# Patient Record
Sex: Female | Born: 1950
Health system: Southern US, Community
[De-identification: ages and names within clinical notes are randomized; demographics above are authoritative.]

## PROBLEM LIST (undated history)

## (undated) DIAGNOSIS — M1990 Unspecified osteoarthritis, unspecified site: Secondary | ICD-10-CM

## (undated) DIAGNOSIS — E785 Hyperlipidemia, unspecified: Secondary | ICD-10-CM

## (undated) DIAGNOSIS — IMO0001 Reserved for inherently not codable concepts without codable children: Secondary | ICD-10-CM

## (undated) DIAGNOSIS — M7989 Other specified soft tissue disorders: Secondary | ICD-10-CM

## (undated) DIAGNOSIS — D649 Anemia, unspecified: Secondary | ICD-10-CM

## (undated) DIAGNOSIS — I82409 Acute embolism and thrombosis of unspecified deep veins of unspecified lower extremity: Secondary | ICD-10-CM

## (undated) DIAGNOSIS — Z5189 Encounter for other specified aftercare: Secondary | ICD-10-CM

## (undated) DIAGNOSIS — I10 Essential (primary) hypertension: Secondary | ICD-10-CM

## (undated) DIAGNOSIS — C801 Malignant (primary) neoplasm, unspecified: Secondary | ICD-10-CM

## (undated) HISTORY — DX: Hyperlipidemia, unspecified: E78.5

## (undated) HISTORY — PX: HIP ARTHROPLASTY: SHX981

## (undated) HISTORY — DX: Other specified soft tissue disorders: M79.89

## (undated) HISTORY — DX: Malignant (primary) neoplasm, unspecified: C80.1

## (undated) HISTORY — PX: TUBAL LIGATION: SHX77

## (undated) HISTORY — DX: Acute embolism and thrombosis of unspecified deep veins of unspecified lower extremity: I82.409

## (undated) HISTORY — DX: Encounter for other specified aftercare: Z51.89

## (undated) HISTORY — DX: Anemia, unspecified: D64.9

## (undated) HISTORY — PX: CHOLECYSTECTOMY: SHX55

## (undated) HISTORY — DX: Reserved for inherently not codable concepts without codable children: IMO0001

## (undated) HISTORY — DX: Essential (primary) hypertension: I10

## (undated) HISTORY — DX: Unspecified osteoarthritis, unspecified site: M19.90

---

## 1996-10-13 HISTORY — PX: BREAST SURGERY: SHX581

## 1998-01-11 ENCOUNTER — Encounter: Admission: RE | Admit: 1998-01-11 | Discharge: 1998-04-11 | Payer: Self-pay | Admitting: General Surgery

## 1998-03-15 ENCOUNTER — Ambulatory Visit (HOSPITAL_BASED_OUTPATIENT_CLINIC_OR_DEPARTMENT_OTHER): Admission: RE | Admit: 1998-03-15 | Discharge: 1998-03-15 | Payer: Self-pay | Admitting: General Surgery

## 1998-04-13 ENCOUNTER — Ambulatory Visit (HOSPITAL_COMMUNITY): Admission: RE | Admit: 1998-04-13 | Discharge: 1998-04-13 | Payer: Self-pay | Admitting: Hematology & Oncology

## 1998-06-05 ENCOUNTER — Other Ambulatory Visit: Admission: RE | Admit: 1998-06-05 | Discharge: 1998-06-05 | Payer: Self-pay | Admitting: *Deleted

## 1998-06-22 ENCOUNTER — Ambulatory Visit (HOSPITAL_COMMUNITY): Admission: RE | Admit: 1998-06-22 | Discharge: 1998-06-22 | Payer: Self-pay | Admitting: Obstetrics

## 1998-06-26 ENCOUNTER — Ambulatory Visit (HOSPITAL_BASED_OUTPATIENT_CLINIC_OR_DEPARTMENT_OTHER): Admission: RE | Admit: 1998-06-26 | Discharge: 1998-06-26 | Payer: Self-pay | Admitting: General Surgery

## 1999-01-11 ENCOUNTER — Encounter: Payer: Self-pay | Admitting: General Surgery

## 1999-01-11 ENCOUNTER — Ambulatory Visit (HOSPITAL_COMMUNITY): Admission: RE | Admit: 1999-01-11 | Discharge: 1999-01-11 | Payer: Self-pay | Admitting: General Surgery

## 1999-06-10 ENCOUNTER — Other Ambulatory Visit: Admission: RE | Admit: 1999-06-10 | Discharge: 1999-06-10 | Payer: Self-pay | Admitting: *Deleted

## 1999-08-09 ENCOUNTER — Other Ambulatory Visit: Admission: RE | Admit: 1999-08-09 | Discharge: 1999-08-09 | Payer: Self-pay | Admitting: *Deleted

## 1999-08-09 ENCOUNTER — Encounter (INDEPENDENT_AMBULATORY_CARE_PROVIDER_SITE_OTHER): Payer: Self-pay | Admitting: Specialist

## 2000-01-14 ENCOUNTER — Encounter: Payer: Self-pay | Admitting: General Surgery

## 2000-01-14 ENCOUNTER — Ambulatory Visit (HOSPITAL_COMMUNITY): Admission: RE | Admit: 2000-01-14 | Discharge: 2000-01-14 | Payer: Self-pay | Admitting: General Surgery

## 2000-02-14 ENCOUNTER — Encounter: Payer: Self-pay | Admitting: General Surgery

## 2000-02-14 ENCOUNTER — Encounter: Admission: RE | Admit: 2000-02-14 | Discharge: 2000-02-14 | Payer: Self-pay | Admitting: General Surgery

## 2000-02-17 ENCOUNTER — Encounter (INDEPENDENT_AMBULATORY_CARE_PROVIDER_SITE_OTHER): Payer: Self-pay | Admitting: *Deleted

## 2000-02-17 ENCOUNTER — Ambulatory Visit (HOSPITAL_BASED_OUTPATIENT_CLINIC_OR_DEPARTMENT_OTHER): Admission: RE | Admit: 2000-02-17 | Discharge: 2000-02-17 | Payer: Self-pay | Admitting: General Surgery

## 2000-06-17 ENCOUNTER — Other Ambulatory Visit: Admission: RE | Admit: 2000-06-17 | Discharge: 2000-06-17 | Payer: Self-pay | Admitting: *Deleted

## 2001-01-15 ENCOUNTER — Encounter: Payer: Self-pay | Admitting: General Surgery

## 2001-01-15 ENCOUNTER — Ambulatory Visit (HOSPITAL_COMMUNITY): Admission: RE | Admit: 2001-01-15 | Discharge: 2001-01-15 | Payer: Self-pay | Admitting: General Surgery

## 2001-05-10 ENCOUNTER — Ambulatory Visit (HOSPITAL_BASED_OUTPATIENT_CLINIC_OR_DEPARTMENT_OTHER): Admission: RE | Admit: 2001-05-10 | Discharge: 2001-05-10 | Payer: Self-pay | Admitting: General Surgery

## 2001-05-10 ENCOUNTER — Encounter (INDEPENDENT_AMBULATORY_CARE_PROVIDER_SITE_OTHER): Payer: Self-pay | Admitting: *Deleted

## 2001-06-01 ENCOUNTER — Ambulatory Visit (HOSPITAL_COMMUNITY): Admission: RE | Admit: 2001-06-01 | Discharge: 2001-06-01 | Payer: Self-pay | Admitting: Hematology & Oncology

## 2001-06-01 ENCOUNTER — Encounter: Payer: Self-pay | Admitting: Hematology & Oncology

## 2001-06-02 ENCOUNTER — Ambulatory Visit: Admission: RE | Admit: 2001-06-02 | Discharge: 2001-08-31 | Payer: Self-pay | Admitting: Radiation Oncology

## 2001-07-05 ENCOUNTER — Other Ambulatory Visit: Admission: RE | Admit: 2001-07-05 | Discharge: 2001-07-05 | Payer: Self-pay | Admitting: *Deleted

## 2001-08-23 ENCOUNTER — Ambulatory Visit (HOSPITAL_COMMUNITY): Admission: RE | Admit: 2001-08-23 | Discharge: 2001-08-23 | Payer: Self-pay | Admitting: Hematology & Oncology

## 2001-08-23 ENCOUNTER — Encounter: Payer: Self-pay | Admitting: Hematology & Oncology

## 2002-01-04 ENCOUNTER — Encounter: Payer: Self-pay | Admitting: Hematology & Oncology

## 2002-01-04 ENCOUNTER — Ambulatory Visit (HOSPITAL_COMMUNITY): Admission: RE | Admit: 2002-01-04 | Discharge: 2002-01-04 | Payer: Self-pay | Admitting: Hematology & Oncology

## 2002-01-18 ENCOUNTER — Encounter: Payer: Self-pay | Admitting: General Surgery

## 2002-01-18 ENCOUNTER — Ambulatory Visit (HOSPITAL_COMMUNITY): Admission: RE | Admit: 2002-01-18 | Discharge: 2002-01-18 | Payer: Self-pay | Admitting: General Surgery

## 2002-07-05 ENCOUNTER — Ambulatory Visit (HOSPITAL_COMMUNITY): Admission: RE | Admit: 2002-07-05 | Discharge: 2002-07-05 | Payer: Self-pay | Admitting: Hematology & Oncology

## 2002-07-05 ENCOUNTER — Encounter: Payer: Self-pay | Admitting: Hematology & Oncology

## 2002-07-11 ENCOUNTER — Other Ambulatory Visit: Admission: RE | Admit: 2002-07-11 | Discharge: 2002-07-11 | Payer: Self-pay | Admitting: *Deleted

## 2002-10-04 ENCOUNTER — Ambulatory Visit (HOSPITAL_COMMUNITY): Admission: RE | Admit: 2002-10-04 | Discharge: 2002-10-04 | Payer: Self-pay | Admitting: Hematology & Oncology

## 2002-10-04 ENCOUNTER — Encounter: Payer: Self-pay | Admitting: Hematology & Oncology

## 2002-11-17 ENCOUNTER — Encounter: Payer: Self-pay | Admitting: Hematology & Oncology

## 2002-11-17 ENCOUNTER — Ambulatory Visit (HOSPITAL_COMMUNITY): Admission: RE | Admit: 2002-11-17 | Discharge: 2002-11-17 | Payer: Self-pay | Admitting: Hematology & Oncology

## 2003-01-31 ENCOUNTER — Ambulatory Visit (HOSPITAL_COMMUNITY): Admission: RE | Admit: 2003-01-31 | Discharge: 2003-01-31 | Payer: Self-pay | Admitting: *Deleted

## 2003-01-31 ENCOUNTER — Encounter: Payer: Self-pay | Admitting: *Deleted

## 2003-02-14 ENCOUNTER — Ambulatory Visit (HOSPITAL_COMMUNITY): Admission: RE | Admit: 2003-02-14 | Discharge: 2003-02-14 | Payer: Self-pay | Admitting: Hematology & Oncology

## 2003-02-14 ENCOUNTER — Encounter: Payer: Self-pay | Admitting: Hematology & Oncology

## 2003-07-19 ENCOUNTER — Other Ambulatory Visit: Admission: RE | Admit: 2003-07-19 | Discharge: 2003-07-19 | Payer: Self-pay | Admitting: *Deleted

## 2003-09-18 ENCOUNTER — Ambulatory Visit (HOSPITAL_COMMUNITY): Admission: RE | Admit: 2003-09-18 | Discharge: 2003-09-18 | Payer: Self-pay | Admitting: Hematology & Oncology

## 2004-02-06 ENCOUNTER — Ambulatory Visit (HOSPITAL_COMMUNITY): Admission: RE | Admit: 2004-02-06 | Discharge: 2004-02-06 | Payer: Self-pay | Admitting: *Deleted

## 2004-07-16 ENCOUNTER — Ambulatory Visit (HOSPITAL_COMMUNITY): Admission: RE | Admit: 2004-07-16 | Discharge: 2004-07-16 | Payer: Self-pay | Admitting: General Surgery

## 2004-08-05 ENCOUNTER — Other Ambulatory Visit: Admission: RE | Admit: 2004-08-05 | Discharge: 2004-08-05 | Payer: Self-pay | Admitting: *Deleted

## 2004-09-17 ENCOUNTER — Ambulatory Visit: Payer: Self-pay | Admitting: Hematology & Oncology

## 2005-02-17 ENCOUNTER — Ambulatory Visit (HOSPITAL_COMMUNITY): Admission: RE | Admit: 2005-02-17 | Discharge: 2005-02-17 | Payer: Self-pay | Admitting: Hematology & Oncology

## 2005-03-21 ENCOUNTER — Ambulatory Visit: Payer: Self-pay | Admitting: Hematology & Oncology

## 2005-08-20 ENCOUNTER — Other Ambulatory Visit: Admission: RE | Admit: 2005-08-20 | Discharge: 2005-08-20 | Payer: Self-pay | Admitting: *Deleted

## 2005-09-19 ENCOUNTER — Ambulatory Visit: Payer: Self-pay | Admitting: Hematology & Oncology

## 2006-02-11 ENCOUNTER — Encounter: Payer: Self-pay | Admitting: General Surgery

## 2006-02-18 ENCOUNTER — Ambulatory Visit (HOSPITAL_COMMUNITY): Admission: RE | Admit: 2006-02-18 | Discharge: 2006-02-18 | Payer: Self-pay | Admitting: Hematology & Oncology

## 2006-03-23 ENCOUNTER — Ambulatory Visit: Payer: Self-pay | Admitting: Hematology & Oncology

## 2006-03-25 LAB — CBC WITH DIFFERENTIAL/PLATELET
BASO%: 0.3 % (ref 0.0–2.0)
HCT: 32.5 % — ABNORMAL LOW (ref 34.8–46.6)
LYMPH%: 29 % (ref 14.0–48.0)
MCH: 28.6 pg (ref 26.0–34.0)
MCHC: 33.7 g/dL (ref 32.0–36.0)
MCV: 85 fL (ref 81.0–101.0)
MONO#: 0.4 10*3/uL (ref 0.1–0.9)
MONO%: 6.5 % (ref 0.0–13.0)
NEUT%: 62.7 % (ref 39.6–76.8)
Platelets: 266 10*3/uL (ref 145–400)
RBC: 3.83 10*6/uL (ref 3.70–5.32)
WBC: 6.3 10*3/uL (ref 3.9–10.0)

## 2006-03-25 LAB — COMPREHENSIVE METABOLIC PANEL
ALT: 12 U/L (ref 0–40)
Alkaline Phosphatase: 72 U/L (ref 39–117)
CO2: 27 mEq/L (ref 19–32)
Creatinine, Ser: 0.69 mg/dL (ref 0.40–1.20)
Total Bilirubin: 0.4 mg/dL (ref 0.3–1.2)

## 2006-03-25 LAB — CANCER ANTIGEN 27.29: CA 27.29: 17 U/mL (ref 0–39)

## 2006-08-26 ENCOUNTER — Other Ambulatory Visit: Admission: RE | Admit: 2006-08-26 | Discharge: 2006-08-26 | Payer: Self-pay | Admitting: *Deleted

## 2006-09-21 ENCOUNTER — Ambulatory Visit: Payer: Self-pay | Admitting: Hematology & Oncology

## 2006-09-23 LAB — CBC WITH DIFFERENTIAL/PLATELET
Eosinophils Absolute: 0.2 10*3/uL (ref 0.0–0.5)
HCT: 32 % — ABNORMAL LOW (ref 34.8–46.6)
LYMPH%: 31.8 % (ref 14.0–48.0)
MONO#: 0.4 10*3/uL (ref 0.1–0.9)
NEUT#: 3.5 10*3/uL (ref 1.5–6.5)
NEUT%: 58 % (ref 39.6–76.8)
Platelets: 272 10*3/uL (ref 145–400)
RBC: 3.76 10*6/uL (ref 3.70–5.32)
WBC: 6 10*3/uL (ref 3.9–10.0)
lymph#: 1.9 10*3/uL (ref 0.9–3.3)

## 2006-09-23 LAB — COMPREHENSIVE METABOLIC PANEL
CO2: 25 mEq/L (ref 19–32)
Calcium: 8.6 mg/dL (ref 8.4–10.5)
Chloride: 104 mEq/L (ref 96–112)
Glucose, Bld: 103 mg/dL — ABNORMAL HIGH (ref 70–99)
Sodium: 144 mEq/L (ref 135–145)
Total Bilirubin: 0.3 mg/dL (ref 0.3–1.2)
Total Protein: 6.5 g/dL (ref 6.0–8.3)

## 2006-10-09 ENCOUNTER — Ambulatory Visit (HOSPITAL_COMMUNITY): Admission: RE | Admit: 2006-10-09 | Discharge: 2006-10-09 | Payer: Self-pay | Admitting: *Deleted

## 2007-02-23 ENCOUNTER — Ambulatory Visit (HOSPITAL_COMMUNITY): Admission: RE | Admit: 2007-02-23 | Discharge: 2007-02-23 | Payer: Self-pay | Admitting: Hematology & Oncology

## 2007-09-14 ENCOUNTER — Other Ambulatory Visit: Admission: RE | Admit: 2007-09-14 | Discharge: 2007-09-14 | Payer: Self-pay | Admitting: *Deleted

## 2007-09-20 ENCOUNTER — Ambulatory Visit: Payer: Self-pay | Admitting: Hematology & Oncology

## 2007-09-22 LAB — COMPREHENSIVE METABOLIC PANEL
ALT: 13 U/L (ref 0–35)
Albumin: 3.9 g/dL (ref 3.5–5.2)
CO2: 26 mEq/L (ref 19–32)
Glucose, Bld: 110 mg/dL — ABNORMAL HIGH (ref 70–99)
Potassium: 3.7 mEq/L (ref 3.5–5.3)
Sodium: 143 mEq/L (ref 135–145)
Total Protein: 7 g/dL (ref 6.0–8.3)

## 2007-09-22 LAB — CBC WITH DIFFERENTIAL/PLATELET
Basophils Absolute: 0 10*3/uL (ref 0.0–0.1)
HCT: 32.1 % — ABNORMAL LOW (ref 34.8–46.6)
HGB: 10.9 g/dL — ABNORMAL LOW (ref 11.6–15.9)
MCH: 28.5 pg (ref 26.0–34.0)
MONO#: 0.4 10*3/uL (ref 0.1–0.9)
NEUT%: 56 % (ref 39.6–76.8)
Platelets: 285 10*3/uL (ref 145–400)
WBC: 6.4 10*3/uL (ref 3.9–10.0)
lymph#: 2.2 10*3/uL (ref 0.9–3.3)

## 2008-02-25 ENCOUNTER — Ambulatory Visit (HOSPITAL_COMMUNITY): Admission: RE | Admit: 2008-02-25 | Discharge: 2008-02-25 | Payer: Self-pay | Admitting: *Deleted

## 2008-09-22 ENCOUNTER — Ambulatory Visit: Payer: Self-pay | Admitting: Hematology & Oncology

## 2008-09-25 LAB — CBC WITH DIFFERENTIAL (CANCER CENTER ONLY)
BASO#: 0 10*3/uL (ref 0.0–0.2)
BASO%: 0.4 % (ref 0.0–2.0)
Eosinophils Absolute: 0.1 10*3/uL (ref 0.0–0.5)
HCT: 33.6 % — ABNORMAL LOW (ref 34.8–46.6)
HGB: 11.2 g/dL — ABNORMAL LOW (ref 11.6–15.9)
LYMPH#: 1.9 10*3/uL (ref 0.9–3.3)
LYMPH%: 28.7 % (ref 14.0–48.0)
MCV: 84 fL (ref 81–101)
MONO#: 0.3 10*3/uL (ref 0.1–0.9)
NEUT%: 64 % (ref 39.6–80.0)
RBC: 3.99 10*6/uL (ref 3.70–5.32)
WBC: 6.5 10*3/uL (ref 3.9–10.0)

## 2008-09-25 LAB — COMPREHENSIVE METABOLIC PANEL
CO2: 25 mEq/L (ref 19–32)
Creatinine, Ser: 0.63 mg/dL (ref 0.40–1.20)
Glucose, Bld: 102 mg/dL — ABNORMAL HIGH (ref 70–99)
Total Bilirubin: 0.3 mg/dL (ref 0.3–1.2)

## 2008-10-17 ENCOUNTER — Other Ambulatory Visit: Admission: RE | Admit: 2008-10-17 | Discharge: 2008-10-17 | Payer: Self-pay | Admitting: Gynecology

## 2009-02-16 ENCOUNTER — Ambulatory Visit: Payer: Self-pay | Admitting: Hematology & Oncology

## 2009-02-19 LAB — CBC WITH DIFFERENTIAL (CANCER CENTER ONLY)
BASO%: 0.3 % (ref 0.0–2.0)
Eosinophils Absolute: 0.1 10*3/uL (ref 0.0–0.5)
LYMPH#: 1.8 10*3/uL (ref 0.9–3.3)
LYMPH%: 38.8 % (ref 14.0–48.0)
MCV: 84 fL (ref 81–101)
MONO#: 0.3 10*3/uL (ref 0.1–0.9)
NEUT#: 2.4 10*3/uL (ref 1.5–6.5)
Platelets: 185 10*3/uL (ref 145–400)
RBC: 3.89 10*6/uL (ref 3.70–5.32)
RDW: 13.3 % (ref 10.5–14.6)
WBC: 4.7 10*3/uL (ref 3.9–10.0)

## 2009-02-19 LAB — COMPREHENSIVE METABOLIC PANEL
ALT: 11 U/L (ref 0–35)
AST: 16 U/L (ref 0–37)
Alkaline Phosphatase: 69 U/L (ref 39–117)
CO2: 25 mEq/L (ref 19–32)
Sodium: 142 mEq/L (ref 135–145)
Total Bilirubin: 0.4 mg/dL (ref 0.3–1.2)
Total Protein: 6.7 g/dL (ref 6.0–8.3)

## 2009-02-26 ENCOUNTER — Ambulatory Visit (HOSPITAL_COMMUNITY): Admission: RE | Admit: 2009-02-26 | Discharge: 2009-02-26 | Payer: Self-pay | Admitting: General Surgery

## 2009-08-17 ENCOUNTER — Ambulatory Visit: Payer: Self-pay | Admitting: Hematology & Oncology

## 2009-08-20 ENCOUNTER — Ambulatory Visit (HOSPITAL_BASED_OUTPATIENT_CLINIC_OR_DEPARTMENT_OTHER): Admission: RE | Admit: 2009-08-20 | Discharge: 2009-08-20 | Payer: Self-pay | Admitting: Hematology & Oncology

## 2009-08-20 ENCOUNTER — Ambulatory Visit: Payer: Self-pay | Admitting: Diagnostic Radiology

## 2009-08-20 LAB — CBC WITH DIFFERENTIAL (CANCER CENTER ONLY)
BASO%: 0.3 % (ref 0.0–2.0)
Eosinophils Absolute: 0.1 10*3/uL (ref 0.0–0.5)
MCH: 28.6 pg (ref 26.0–34.0)
MONO#: 0.3 10*3/uL (ref 0.1–0.9)
MONO%: 5.4 % (ref 0.0–13.0)
NEUT#: 3.1 10*3/uL (ref 1.5–6.5)
Platelets: 213 10*3/uL (ref 145–400)
RBC: 3.86 10*6/uL (ref 3.70–5.32)
RDW: 13.2 % (ref 10.5–14.6)
WBC: 5.6 10*3/uL (ref 3.9–10.0)

## 2009-08-21 LAB — COMPREHENSIVE METABOLIC PANEL
AST: 11 U/L (ref 0–37)
Albumin: 4.1 g/dL (ref 3.5–5.2)
BUN: 17 mg/dL (ref 6–23)
CO2: 24 mEq/L (ref 19–32)
Calcium: 9.1 mg/dL (ref 8.4–10.5)
Chloride: 107 mEq/L (ref 96–112)
Glucose, Bld: 114 mg/dL — ABNORMAL HIGH (ref 70–99)
Potassium: 3.8 mEq/L (ref 3.5–5.3)

## 2009-08-21 LAB — VITAMIN D 25 HYDROXY (VIT D DEFICIENCY, FRACTURES): Vit D, 25-Hydroxy: 15 ng/mL — ABNORMAL LOW (ref 30–89)

## 2010-02-07 ENCOUNTER — Ambulatory Visit: Payer: Self-pay | Admitting: Hematology & Oncology

## 2010-02-11 LAB — COMPREHENSIVE METABOLIC PANEL
Alkaline Phosphatase: 65 U/L (ref 39–117)
BUN: 15 mg/dL (ref 6–23)
Creatinine, Ser: 0.64 mg/dL (ref 0.40–1.20)
Glucose, Bld: 112 mg/dL — ABNORMAL HIGH (ref 70–99)
Total Bilirubin: 0.3 mg/dL (ref 0.3–1.2)

## 2010-02-11 LAB — CBC WITH DIFFERENTIAL (CANCER CENTER ONLY)
BASO#: 0 10*3/uL (ref 0.0–0.2)
Eosinophils Absolute: 0.1 10*3/uL (ref 0.0–0.5)
HGB: 11.1 g/dL — ABNORMAL LOW (ref 11.6–15.9)
LYMPH%: 39 % (ref 14.0–48.0)
MCH: 28.9 pg (ref 26.0–34.0)
MCV: 84 fL (ref 81–101)
MONO%: 5.6 % (ref 0.0–13.0)
NEUT%: 52.3 % (ref 39.6–80.0)
Platelets: 221 10*3/uL (ref 145–400)
RBC: 3.84 10*6/uL (ref 3.70–5.32)

## 2010-03-04 ENCOUNTER — Ambulatory Visit (HOSPITAL_COMMUNITY): Admission: RE | Admit: 2010-03-04 | Discharge: 2010-03-04 | Payer: Self-pay | Admitting: General Surgery

## 2010-06-13 ENCOUNTER — Encounter: Admission: RE | Admit: 2010-06-13 | Discharge: 2010-06-13 | Payer: Self-pay | Admitting: Orthopedic Surgery

## 2010-08-09 ENCOUNTER — Ambulatory Visit: Payer: Self-pay | Admitting: Hematology & Oncology

## 2010-08-14 LAB — CBC WITH DIFFERENTIAL (CANCER CENTER ONLY)
Eosinophils Absolute: 0.1 10*3/uL (ref 0.0–0.5)
HCT: 31.2 % — ABNORMAL LOW (ref 34.8–46.6)
LYMPH#: 2.3 10*3/uL (ref 0.9–3.3)
LYMPH%: 37.5 % (ref 14.0–48.0)
MCV: 84 fL (ref 81–101)
MONO#: 0.4 10*3/uL (ref 0.1–0.9)
NEUT%: 53.4 % (ref 39.6–80.0)
RBC: 3.73 10*6/uL (ref 3.70–5.32)
WBC: 6.2 10*3/uL (ref 3.9–10.0)

## 2010-08-16 LAB — VITAMIN D 25 HYDROXY (VIT D DEFICIENCY, FRACTURES): Vit D, 25-Hydroxy: 24 ng/mL — ABNORMAL LOW (ref 30–89)

## 2010-08-16 LAB — RETICULOCYTES (CHCC)
ABS Retic: 40.3 10*3/uL (ref 19.0–186.0)
RBC.: 3.66 MIL/uL — ABNORMAL LOW (ref 3.87–5.11)

## 2010-08-16 LAB — COMPREHENSIVE METABOLIC PANEL
CO2: 27 mEq/L (ref 19–32)
Calcium: 8.9 mg/dL (ref 8.4–10.5)
Chloride: 107 mEq/L (ref 96–112)
Creatinine, Ser: 0.65 mg/dL (ref 0.40–1.20)
Glucose, Bld: 99 mg/dL (ref 70–99)
Sodium: 144 mEq/L (ref 135–145)
Total Bilirubin: 0.2 mg/dL — ABNORMAL LOW (ref 0.3–1.2)
Total Protein: 6.6 g/dL (ref 6.0–8.3)

## 2010-08-16 LAB — PROTEIN ELECTROPHORESIS, SERUM
Albumin ELP: 53.8 % — ABNORMAL LOW (ref 55.8–66.1)
Alpha-1-Globulin: 5.3 % — ABNORMAL HIGH (ref 2.9–4.9)
Alpha-2-Globulin: 14.3 % — ABNORMAL HIGH (ref 7.1–11.8)
Beta 2: 6.3 % (ref 3.2–6.5)
Beta Globulin: 5.8 % (ref 4.7–7.2)
Total Protein, Serum Electrophoresis: 6.6 g/dL (ref 6.0–8.3)

## 2010-08-16 LAB — IRON AND TIBC: TIBC: 271 ug/dL (ref 250–470)

## 2010-08-16 LAB — FERRITIN: Ferritin: 108 ng/mL (ref 10–291)

## 2010-09-17 ENCOUNTER — Ambulatory Visit (HOSPITAL_COMMUNITY)
Admission: RE | Admit: 2010-09-17 | Discharge: 2010-09-17 | Payer: Self-pay | Source: Home / Self Care | Attending: Hematology & Oncology | Admitting: Hematology & Oncology

## 2010-10-13 DIAGNOSIS — C801 Malignant (primary) neoplasm, unspecified: Secondary | ICD-10-CM | POA: Insufficient documentation

## 2010-10-13 HISTORY — DX: Malignant (primary) neoplasm, unspecified: C80.1

## 2010-10-13 HISTORY — PX: MASTECTOMY: SHX3

## 2010-10-18 ENCOUNTER — Ambulatory Visit (HOSPITAL_BASED_OUTPATIENT_CLINIC_OR_DEPARTMENT_OTHER): Payer: 59 | Admitting: Hematology & Oncology

## 2010-10-21 LAB — CBC WITH DIFFERENTIAL (CANCER CENTER ONLY)
BASO#: 0 10*3/uL (ref 0.0–0.2)
BASO%: 0.3 % (ref 0.0–2.0)
EOS%: 1.9 % (ref 0.0–7.0)
Eosinophils Absolute: 0.2 10*3/uL (ref 0.0–0.5)
HCT: 29.8 % — ABNORMAL LOW (ref 34.8–46.6)
HGB: 9.8 g/dL — ABNORMAL LOW (ref 11.6–15.9)
LYMPH#: 2 10*3/uL (ref 0.9–3.3)
LYMPH%: 23.5 % (ref 14.0–48.0)
MCH: 26.8 pg (ref 26.0–34.0)
MCHC: 33 g/dL (ref 32.0–36.0)
MCV: 81 fL (ref 81–101)
MONO#: 0.5 10*3/uL (ref 0.1–0.9)
MONO%: 5.8 % (ref 0.0–13.0)
NEUT#: 5.8 10*3/uL (ref 1.5–6.5)
NEUT%: 68.5 % (ref 39.6–80.0)
Platelets: 339 10*3/uL (ref 145–400)
RBC: 3.66 10*6/uL — ABNORMAL LOW (ref 3.70–5.32)
RDW: 12.5 % (ref 10.5–14.6)
WBC: 8.4 10*3/uL (ref 3.9–10.0)

## 2010-10-21 LAB — RETICULOCYTES (CHCC)
ABS Retic: 32.9 10*3/uL (ref 19.0–186.0)
RBC.: 3.66 MIL/uL — ABNORMAL LOW (ref 3.87–5.11)
Retic Ct Pct: 0.9 % (ref 0.4–3.1)

## 2010-10-21 LAB — CHCC SATELLITE - SMEAR

## 2010-11-02 ENCOUNTER — Encounter: Payer: Self-pay | Admitting: General Surgery

## 2010-11-02 ENCOUNTER — Encounter: Payer: Self-pay | Admitting: Hematology & Oncology

## 2010-11-14 ENCOUNTER — Encounter (HOSPITAL_BASED_OUTPATIENT_CLINIC_OR_DEPARTMENT_OTHER): Payer: 59 | Admitting: Hematology & Oncology

## 2010-11-14 DIAGNOSIS — C50919 Malignant neoplasm of unspecified site of unspecified female breast: Secondary | ICD-10-CM

## 2010-11-14 DIAGNOSIS — D649 Anemia, unspecified: Secondary | ICD-10-CM

## 2010-11-14 DIAGNOSIS — D509 Iron deficiency anemia, unspecified: Secondary | ICD-10-CM

## 2010-11-14 LAB — IRON AND TIBC
%SAT: 18 % — ABNORMAL LOW (ref 20–55)
Iron: 44 ug/dL (ref 42–145)
TIBC: 238 ug/dL — ABNORMAL LOW (ref 250–470)
UIBC: 194 ug/dL

## 2010-11-14 LAB — COMPREHENSIVE METABOLIC PANEL
ALT: 8 U/L (ref 0–35)
AST: 11 U/L (ref 0–37)
Albumin: 3.9 g/dL (ref 3.5–5.2)
Alkaline Phosphatase: 78 U/L (ref 39–117)
BUN: 14 mg/dL (ref 6–23)
CO2: 25 mEq/L (ref 19–32)
Calcium: 9 mg/dL (ref 8.4–10.5)
Chloride: 104 mEq/L (ref 96–112)
Creatinine, Ser: 0.62 mg/dL (ref 0.40–1.20)
Glucose, Bld: 85 mg/dL (ref 70–99)
Potassium: 3.7 mEq/L (ref 3.5–5.3)
Sodium: 143 mEq/L (ref 135–145)
Total Bilirubin: 0.4 mg/dL (ref 0.3–1.2)
Total Protein: 7.2 g/dL (ref 6.0–8.3)

## 2010-11-14 LAB — CHCC SATELLITE - SMEAR

## 2010-11-14 LAB — CBC WITH DIFFERENTIAL (CANCER CENTER ONLY)
BASO#: 0 10*3/uL (ref 0.0–0.2)
BASO%: 0.5 % (ref 0.0–2.0)
EOS%: 2.1 % (ref 0.0–7.0)
Eosinophils Absolute: 0.1 10*3/uL (ref 0.0–0.5)
HCT: 32.9 % — ABNORMAL LOW (ref 34.8–46.6)
HGB: 11 g/dL — ABNORMAL LOW (ref 11.6–15.9)
LYMPH#: 1.9 10*3/uL (ref 0.9–3.3)
LYMPH%: 27.4 % (ref 14.0–48.0)
MCH: 27.7 pg (ref 26.0–34.0)
MCHC: 33.4 g/dL (ref 32.0–36.0)
MCV: 83 fL (ref 81–101)
MONO#: 0.4 10*3/uL (ref 0.1–0.9)
MONO%: 6.2 % (ref 0.0–13.0)
NEUT#: 4.3 10*3/uL (ref 1.5–6.5)
NEUT%: 63.8 % (ref 39.6–80.0)
Platelets: 273 10*3/uL (ref 145–400)
RBC: 3.95 10*6/uL (ref 3.70–5.32)
RDW: 13.2 % (ref 10.5–14.6)
WBC: 6.8 10*3/uL (ref 3.9–10.0)

## 2010-11-14 LAB — RETICULOCYTES (CHCC)
ABS Retic: 35.2 10*3/uL (ref 19.0–186.0)
RBC.: 3.91 MIL/uL (ref 3.87–5.11)
Retic Ct Pct: 0.9 % (ref 0.4–3.1)

## 2010-11-14 LAB — FERRITIN: Ferritin: 288 ng/mL (ref 10–291)

## 2010-11-14 LAB — CANCER ANTIGEN 27.29: CA 27.29: 21 U/mL (ref 0–39)

## 2011-01-10 ENCOUNTER — Other Ambulatory Visit: Payer: Self-pay | Admitting: Hematology & Oncology

## 2011-01-10 ENCOUNTER — Encounter (HOSPITAL_BASED_OUTPATIENT_CLINIC_OR_DEPARTMENT_OTHER): Payer: 59 | Admitting: Hematology & Oncology

## 2011-01-10 DIAGNOSIS — D649 Anemia, unspecified: Secondary | ICD-10-CM

## 2011-01-10 DIAGNOSIS — C50919 Malignant neoplasm of unspecified site of unspecified female breast: Secondary | ICD-10-CM

## 2011-01-10 DIAGNOSIS — D509 Iron deficiency anemia, unspecified: Secondary | ICD-10-CM

## 2011-01-10 LAB — CBC WITH DIFFERENTIAL (CANCER CENTER ONLY)
BASO%: 0.3 % (ref 0.0–2.0)
EOS%: 1.5 % (ref 0.0–7.0)
Eosinophils Absolute: 0.1 10*3/uL (ref 0.0–0.5)
LYMPH%: 27 % (ref 14.0–48.0)
MCH: 28.2 pg (ref 26.0–34.0)
MCHC: 32.4 g/dL (ref 32.0–36.0)
MCV: 87 fL (ref 81–101)
MONO%: 7.8 % (ref 0.0–13.0)
NEUT#: 4.3 10*3/uL (ref 1.5–6.5)
Platelets: 228 10*3/uL (ref 145–400)
RBC: 3.87 10*6/uL (ref 3.70–5.32)
RDW: 15.1 % (ref 11.1–15.7)

## 2011-01-10 LAB — IRON AND TIBC
%SAT: 25 % (ref 20–55)
Iron: 63 ug/dL (ref 42–145)

## 2011-01-10 LAB — RETICULOCYTES (CHCC)
RBC.: 3.95 MIL/uL (ref 3.87–5.11)
Retic Ct Pct: 0.8 % (ref 0.4–3.1)

## 2011-02-03 ENCOUNTER — Other Ambulatory Visit (HOSPITAL_COMMUNITY): Payer: Self-pay | Admitting: General Surgery

## 2011-02-03 DIAGNOSIS — Z1231 Encounter for screening mammogram for malignant neoplasm of breast: Secondary | ICD-10-CM

## 2011-02-28 NOTE — Op Note (Signed)
[place]  Patient:    [name], [name]                           MRN: [id number] Proc. Date: 02/17/00 Adm. Date:  [id number] Disc. Date: [id number] Attending:  Arlis Porta                           Operative Report  PREOPERATIVE DIAGNOSIS:  Left breast mass.  POSTOPERATIVE DIAGNOSIS:  Left breast mass.  PROCEDURE:  Left breast biopsy.  SURGEON:  Adolph Pollack, M.D.  ANESTHESIA:  Local (1% lidocaine with epinephrine, plus 0.5% plain Marcaine, plus sodium bicarbonate) with MAC.  INDICATIONS:  Ms. [name] is a 60 year old female, status post right modified radical mastectomy for breast cancer.  On her follow-up visit, she had a palpable mass at the 5-6 oclock position and now presents for biopsy.  TECHNIQUE:  She was placed supine on the operating table and given intravenous sedation.  The left breast area was sterilely prepped and draped.  Local anesthetic was infiltrated directly over the mass in the left inferior breast region and an incision was made directly over the mass and the mass was excised sharply and sent to pathology fresh.  Next, the biopsy cavity was inspected and hemostasis was obtained with the cautery.  Once hemostasis was adequate, subcutaneous fat was loosely approximated with interrupted 3-0 Vicryl sutures and the skin closed with a 4-0 Monocryl subcuticular stitch. Steri-Strips and sterile dressing were applied.  She tolerated the procedure well without any apparent complications and was taken to the recovery room in satisfactory condition. DD:  03/16/00 TD:  03/18/00 Job: [id number]

## 2011-03-07 ENCOUNTER — Ambulatory Visit (HOSPITAL_COMMUNITY): Payer: 59

## 2011-03-07 ENCOUNTER — Ambulatory Visit (HOSPITAL_COMMUNITY)
Admission: RE | Admit: 2011-03-07 | Discharge: 2011-03-07 | Disposition: A | Discharge status: Home / Self Care | Payer: 59 | Source: Ambulatory Visit | Attending: General Surgery | Admitting: General Surgery

## 2011-03-07 DIAGNOSIS — Z1231 Encounter for screening mammogram for malignant neoplasm of breast: Secondary | ICD-10-CM | POA: Insufficient documentation

## 2011-03-14 ENCOUNTER — Other Ambulatory Visit: Payer: Self-pay | Admitting: Hematology & Oncology

## 2011-03-14 ENCOUNTER — Encounter (HOSPITAL_BASED_OUTPATIENT_CLINIC_OR_DEPARTMENT_OTHER): Payer: 59 | Admitting: Hematology & Oncology

## 2011-03-14 DIAGNOSIS — C50919 Malignant neoplasm of unspecified site of unspecified female breast: Secondary | ICD-10-CM

## 2011-03-14 DIAGNOSIS — D509 Iron deficiency anemia, unspecified: Secondary | ICD-10-CM

## 2011-03-14 LAB — COMPREHENSIVE METABOLIC PANEL
Alkaline Phosphatase: 74 U/L (ref 39–117)
BUN: 21 mg/dL (ref 6–23)
CO2: 27 mEq/L (ref 19–32)
Creatinine, Ser: 0.56 mg/dL (ref 0.50–1.10)
Glucose, Bld: 99 mg/dL (ref 70–99)
Sodium: 143 mEq/L (ref 135–145)
Total Bilirubin: 0.3 mg/dL (ref 0.3–1.2)
Total Protein: 7.1 g/dL (ref 6.0–8.3)

## 2011-03-14 LAB — IRON AND TIBC
%SAT: 14 % — ABNORMAL LOW (ref 20–55)
Iron: 37 ug/dL — ABNORMAL LOW (ref 42–145)
UIBC: 237 ug/dL

## 2011-03-14 LAB — FERRITIN: Ferritin: 166 ng/mL (ref 10–291)

## 2011-03-14 LAB — CBC WITH DIFFERENTIAL (CANCER CENTER ONLY)
BASO#: 0 10*3/uL (ref 0.0–0.2)
Eosinophils Absolute: 0.2 10*3/uL (ref 0.0–0.5)
HCT: 31.9 % — ABNORMAL LOW (ref 34.8–46.6)
HGB: 10.5 g/dL — ABNORMAL LOW (ref 11.6–15.9)
LYMPH#: 2.1 10*3/uL (ref 0.9–3.3)
MCH: 28.8 pg (ref 26.0–34.0)
MONO%: 7.4 % (ref 0.0–13.0)
NEUT#: 3.4 10*3/uL (ref 1.5–6.5)
RBC: 3.65 10*6/uL — ABNORMAL LOW (ref 3.70–5.32)

## 2011-03-14 LAB — RETICULOCYTES (CHCC): Retic Ct Pct: 1.2 % (ref 0.4–3.1)

## 2011-03-21 ENCOUNTER — Encounter (HOSPITAL_BASED_OUTPATIENT_CLINIC_OR_DEPARTMENT_OTHER): Payer: 59 | Admitting: Hematology & Oncology

## 2011-03-21 DIAGNOSIS — C50919 Malignant neoplasm of unspecified site of unspecified female breast: Secondary | ICD-10-CM

## 2011-03-21 DIAGNOSIS — D509 Iron deficiency anemia, unspecified: Secondary | ICD-10-CM

## 2011-03-28 ENCOUNTER — Encounter (HOSPITAL_COMMUNITY)
Admission: RE | Admit: 2011-03-28 | Discharge: 2011-03-28 | Disposition: A | Discharge status: Home / Self Care | Payer: 59 | Source: Ambulatory Visit | Attending: Orthopedic Surgery | Admitting: Orthopedic Surgery

## 2011-03-28 ENCOUNTER — Other Ambulatory Visit (HOSPITAL_COMMUNITY): Payer: Self-pay | Admitting: Orthopedic Surgery

## 2011-03-28 ENCOUNTER — Ambulatory Visit (HOSPITAL_COMMUNITY)
Admission: RE | Admit: 2011-03-28 | Discharge: 2011-03-28 | Disposition: A | Discharge status: Home / Self Care | Payer: 59 | Source: Ambulatory Visit | Attending: Orthopedic Surgery | Admitting: Orthopedic Surgery

## 2011-03-28 DIAGNOSIS — Z01818 Encounter for other preprocedural examination: Secondary | ICD-10-CM | POA: Insufficient documentation

## 2011-03-28 DIAGNOSIS — Z01812 Encounter for preprocedural laboratory examination: Secondary | ICD-10-CM | POA: Insufficient documentation

## 2011-03-28 DIAGNOSIS — Z96649 Presence of unspecified artificial hip joint: Secondary | ICD-10-CM

## 2011-03-28 LAB — COMPREHENSIVE METABOLIC PANEL
ALT: 15 U/L (ref 0–35)
AST: 14 U/L (ref 0–37)
Alkaline Phosphatase: 87 U/L (ref 39–117)
CO2: 32 mEq/L (ref 19–32)
Calcium: 9 mg/dL (ref 8.4–10.5)
GFR calc Af Amer: >60 mL/min (ref >60)
GFR calc non Af Amer: >60 mL/min (ref >60)
Glucose, Bld: 95 mg/dL (ref 70–99)
Potassium: 3.4 mEq/L — ABNORMAL LOW (ref 3.5–5.1)
Sodium: 143 mEq/L (ref 135–145)
Total Protein: 7.2 g/dL (ref 6.0–8.3)

## 2011-03-28 LAB — CBC
Hemoglobin: 11.3 g/dL — ABNORMAL LOW (ref 12.0–15.0)
MCH: 28.5 pg (ref 26.0–34.0)
MCHC: 32 g/dL (ref 30.0–36.0)
Platelets: 235 10*3/uL (ref 150–400)
RDW: 13.8 % (ref 11.5–15.5)

## 2011-03-28 LAB — DIFFERENTIAL
Basophils Absolute: 0 10*3/uL (ref 0.0–0.1)
Basophils Relative: 0 % (ref 0–1)
Eosinophils Absolute: 0.2 10*3/uL (ref 0.0–0.7)
Eosinophils Relative: 2 % (ref 0–5)
Monocytes Absolute: 0.7 10*3/uL (ref 0.1–1.0)
Monocytes Relative: 8 % (ref 3–12)
Neutro Abs: 4.2 10*3/uL (ref 1.7–7.7)

## 2011-03-28 LAB — URINALYSIS, ROUTINE W REFLEX MICROSCOPIC
Bilirubin Urine: NEGATIVE
Glucose, UA: NEGATIVE mg/dL
Protein, ur: NEGATIVE mg/dL
Urobilinogen, UA: 0.2 mg/dL (ref 0.0–1.0)

## 2011-03-28 LAB — URINE MICROSCOPIC-ADD ON

## 2011-03-28 LAB — PROTIME-INR
INR: 0.95 (ref 0.00–1.49)
Prothrombin Time: 12.9 seconds (ref 11.6–15.2)

## 2011-03-30 LAB — URINE CULTURE: Colony Count: >=100000

## 2011-04-04 ENCOUNTER — Inpatient Hospital Stay (HOSPITAL_COMMUNITY)
Admission: RE | Admit: 2011-04-04 | Discharge: 2011-04-09 | DRG: 470 | Disposition: A | Discharge status: Home Health | Payer: 59 | Source: Ambulatory Visit | Attending: Orthopedic Surgery | Admitting: Orthopedic Surgery

## 2011-04-04 ENCOUNTER — Inpatient Hospital Stay (HOSPITAL_COMMUNITY): Payer: 59

## 2011-04-04 DIAGNOSIS — K5900 Constipation, unspecified: Secondary | ICD-10-CM | POA: Diagnosis present

## 2011-04-04 DIAGNOSIS — M169 Osteoarthritis of hip, unspecified: Principal | ICD-10-CM | POA: Diagnosis present

## 2011-04-04 DIAGNOSIS — Z853 Personal history of malignant neoplasm of breast: Secondary | ICD-10-CM

## 2011-04-04 DIAGNOSIS — Z6835 Body mass index (BMI) 35.0-35.9, adult: Secondary | ICD-10-CM

## 2011-04-04 DIAGNOSIS — M1610 Unilateral primary osteoarthritis, unspecified hip: Principal | ICD-10-CM | POA: Diagnosis present

## 2011-04-04 DIAGNOSIS — E876 Hypokalemia: Secondary | ICD-10-CM | POA: Diagnosis present

## 2011-04-04 DIAGNOSIS — D62 Acute posthemorrhagic anemia: Secondary | ICD-10-CM | POA: Diagnosis present

## 2011-04-04 DIAGNOSIS — N390 Urinary tract infection, site not specified: Secondary | ICD-10-CM | POA: Diagnosis present

## 2011-04-04 HISTORY — PX: JOINT REPLACEMENT: SHX530

## 2011-04-05 LAB — POCT I-STAT 4, (NA,K, GLUC, HGB,HCT)
Glucose, Bld: 131 mg/dL — ABNORMAL HIGH (ref 70–99)
Hemoglobin: 9.2 g/dL — ABNORMAL LOW (ref 12.0–15.0)
Potassium: 3.5 mEq/L (ref 3.5–5.1)
Sodium: 144 mEq/L (ref 135–145)

## 2011-04-05 LAB — CBC
MCH: 27.4 pg (ref 26.0–34.0)
MCHC: 31.2 g/dL (ref 30.0–36.0)
MCV: 88 fL (ref 78.0–100.0)
Platelets: 110 10*3/uL — ABNORMAL LOW (ref 150–400)
RDW: 14.1 % (ref 11.5–15.5)

## 2011-04-05 LAB — BASIC METABOLIC PANEL
CO2: 29 mEq/L (ref 19–32)
Calcium: 7.8 mg/dL — ABNORMAL LOW (ref 8.4–10.5)
Creatinine, Ser: 0.53 mg/dL (ref 0.50–1.10)
GFR calc Af Amer: >60 mL/min (ref >60)
GFR calc non Af Amer: >60 mL/min (ref >60)

## 2011-04-06 LAB — CBC
HCT: 20.4 % — ABNORMAL LOW (ref 36.0–46.0)
MCHC: 32.8 g/dL (ref 30.0–36.0)
MCV: 88.3 fL (ref 78.0–100.0)
RDW: 14.5 % (ref 11.5–15.5)

## 2011-04-06 LAB — URINE CULTURE

## 2011-04-06 LAB — BASIC METABOLIC PANEL
BUN: 13 mg/dL (ref 6–23)
Creatinine, Ser: 0.55 mg/dL (ref 0.50–1.10)
GFR calc Af Amer: >60 mL/min (ref >60)
GFR calc non Af Amer: >60 mL/min (ref >60)
Potassium: 3.4 mEq/L — ABNORMAL LOW (ref 3.5–5.1)

## 2011-04-07 LAB — BASIC METABOLIC PANEL
Calcium: 6.9 mg/dL — ABNORMAL LOW (ref 8.4–10.5)
Creatinine, Ser: <0.47 mg/dL — ABNORMAL LOW (ref 0.50–1.10)

## 2011-04-07 LAB — CBC
MCH: 29.4 pg (ref 26.0–34.0)
MCV: 87.7 fL (ref 78.0–100.0)
Platelets: 122 10*3/uL — ABNORMAL LOW (ref 150–400)
Platelets: 149 10*3/uL — ABNORMAL LOW (ref 150–400)
RDW: 14.3 % (ref 11.5–15.5)
RDW: 14.2 % (ref 11.5–15.5)
WBC: 7.7 10*3/uL (ref 4.0–10.5)

## 2011-04-07 LAB — COMPREHENSIVE METABOLIC PANEL
Albumin: 2.1 g/dL — ABNORMAL LOW (ref 3.5–5.2)
Alkaline Phosphatase: 74 U/L (ref 39–117)
BUN: 5 mg/dL — ABNORMAL LOW (ref 6–23)
CO2: 28 mEq/L (ref 19–32)
Chloride: 106 mEq/L (ref 96–112)
Creatinine, Ser: <0.47 mg/dL — ABNORMAL LOW (ref 0.50–1.10)
Glucose, Bld: 159 mg/dL — ABNORMAL HIGH (ref 70–99)
Potassium: 3 mEq/L — ABNORMAL LOW (ref 3.5–5.1)
Total Bilirubin: 0.6 mg/dL (ref 0.3–1.2)

## 2011-04-07 LAB — PROTIME-INR: INR: 1.08 (ref 0.00–1.49)

## 2011-04-07 LAB — POCT OCCULT BLOOD STOOL (DEVICE): Fecal Occult Bld: NEGATIVE

## 2011-04-08 LAB — CROSSMATCH
Unit division: 0
Unit division: 0

## 2011-04-08 LAB — CBC
HCT: 27.8 % — ABNORMAL LOW (ref 36.0–46.0)
MCHC: 33.1 g/dL (ref 30.0–36.0)
Platelets: 189 10*3/uL (ref 150–400)
RDW: 14.5 % (ref 11.5–15.5)
WBC: 6.6 10*3/uL (ref 4.0–10.5)

## 2011-04-08 LAB — BASIC METABOLIC PANEL
BUN: 7 mg/dL (ref 6–23)
Chloride: 107 mEq/L (ref 96–112)
Creatinine, Ser: <0.47 mg/dL — ABNORMAL LOW (ref 0.50–1.10)
Potassium: 3.5 mEq/L (ref 3.5–5.1)

## 2011-04-09 LAB — CBC
Hemoglobin: 9.2 g/dL — ABNORMAL LOW (ref 12.0–15.0)
MCHC: 33 g/dL (ref 30.0–36.0)
RDW: 14.4 % (ref 11.5–15.5)
WBC: 6.1 10*3/uL (ref 4.0–10.5)

## 2011-05-05 ENCOUNTER — Encounter (HOSPITAL_BASED_OUTPATIENT_CLINIC_OR_DEPARTMENT_OTHER): Payer: 59 | Admitting: Hematology & Oncology

## 2011-05-05 ENCOUNTER — Other Ambulatory Visit: Payer: Self-pay | Admitting: Hematology & Oncology

## 2011-05-05 DIAGNOSIS — C50919 Malignant neoplasm of unspecified site of unspecified female breast: Secondary | ICD-10-CM

## 2011-05-05 DIAGNOSIS — D509 Iron deficiency anemia, unspecified: Secondary | ICD-10-CM

## 2011-05-05 DIAGNOSIS — Z853 Personal history of malignant neoplasm of breast: Secondary | ICD-10-CM

## 2011-05-05 LAB — CBC WITH DIFFERENTIAL (CANCER CENTER ONLY)
BASO#: 0 10*3/uL (ref 0.0–0.2)
Eosinophils Absolute: 0.1 10*3/uL (ref 0.0–0.5)
HGB: 10.2 g/dL — ABNORMAL LOW (ref 11.6–15.9)
MONO#: 0.5 10*3/uL (ref 0.1–0.9)
NEUT#: 3.8 10*3/uL (ref 1.5–6.5)
Platelets: 184 10*3/uL (ref 145–400)
RBC: 3.41 10*6/uL — ABNORMAL LOW (ref 3.70–5.32)
WBC: 6.4 10*3/uL (ref 3.9–10.0)

## 2011-05-05 LAB — CHCC SATELLITE - SMEAR

## 2011-05-06 NOTE — Op Note (Signed)
NAMEKAIDENCE, SANT NO.:  [id number]  MEDICAL RECORD NO.:  [id number]  LOCATION:  5011                         FACILITY:  [facility]  PHYSICIAN:  Dyke Brackett, M.D.    DATE OF BIRTH:  Mar 04, 1951  DATE OF PROCEDURE: DATE OF DISCHARGE:                              OPERATIVE REPORT   INDICATIONS:  A 60 year old with a BMI of approximately 50 with severe osteoarthritis of the left hip, thought to be amenable to hospitalization.  PREOPERATIVE DIAGNOSIS:  Severe osteoarthritis, left hip.  POSTOPERATIVE DIAGNOSIS:  Severe osteoarthritis, left hip.  OPERATION:  Left total hip replacement (prodigy small statured porous coated hip with 100 series acetabulum, 54-mm diameter plus four 10- degree, 36 mm liner and +12,  36 mm metal head with the apex hole eliminator).  SURGEON:  Dyke Brackett, MD  ASSISTANT:  [name]. Su Hilt, [place]  BLOOD LOSS:  Approximately 600.  DESCRIPTION OF PROCEDURE:  It is noted in the record, we had difficulty due to the patient's BMI was certainly significantly increased; however, we accomplished the case without any known complications.  She was approached with the lateral position with posterior approach to the hip made splitting the iliotibial band in the gluteus maximus fascia.  We slid the external rotators, carefully taking the abductor, severe osteoarthritis was noted.  The hip was essentially almost used with no real movement to aggressive capsulectomy.  We dislocated the hip, cut a very conservative neck cut.  The neck was somewhat foreshortened.  We then progressively identified the canal and reamed and then rasped up to a of 13.5 mm small stature stem.  Retractors were placed on the acetabular side anteriorly inferiorly as well as wing retractors superiorly and posterior, two wing retractors, and progressively reamed the acetabulum after cleaning out all the soft tissue and labrum.  We initially placed the cup with a trial  liner, which seemed to be in solid position.  Attention was next directed to femur in the process of doing a trial femur reduction with a rasp, the cup split out.  Initially, it appeared that we may have undersized the acetabulum, but on careful inspection, it was noted there was some significant osteophytes that we have made to set the cup on, and we checked the depth and felt like we could ream more deeply based on probing to the inner table . we would not broach the inner table.  We then progressively deepened the acetabulum towards the inner table and then redid the cup, it was very stable.  Due to the technical difficulty noted though, we took an intraoperative x-ray and noted no problems relative to cup positioning and more importantly no fracture of the pelvis was seen.  Once the acetabulum proper was placed at the apex screw and the poly, we then placed the final femur at 13.5 mm small stature at the appropriate anteversion.  We then trialed off that apparatus and ended up showing +12 mm neck length, provided excellent restoration of the leg strength, balance of the tendons as well as stability in the hip, could only be dissipated with extreme flexion, internal rotation, and adduction. Prior to placing all  the components in the wound, bony surfaces were irrigated.  We then closed the capsule, which we had split followed by running Ethibond on the fascia lata, gluteus maximus, several layers of closure due to the fatty layer and subcutaneous tissue, and skin with clips.  Marcaine with epinephrine.  Lightly compressive sterile dressing applied.  Taken in a knee immobilizer.  Taken to recovery room in a stable condition.     Dyke Brackett, M.D.     WDC/MEDQ  D:  04/04/2011  T:  04/05/2011  Job:  [id number]  Electronically Signed by W. Tyreonna Czaplicki M.D. on 05/06/2011 08:21:34 AM

## 2011-05-08 LAB — RETICULOCYTES (CHCC)
ABS Retic: 55.5 10*3/uL (ref 19.0–186.0)
RBC.: 3.47 MIL/uL — ABNORMAL LOW (ref 3.87–5.11)
Retic Ct Pct: 1.6 % (ref 0.4–2.3)

## 2011-05-08 LAB — FERRITIN: Ferritin: 542 ng/mL — ABNORMAL HIGH (ref 10–291)

## 2011-05-08 LAB — IRON AND TIBC
Iron: 63 ug/dL (ref 42–145)
TIBC: 232 ug/dL — ABNORMAL LOW (ref 250–470)
UIBC: 169 ug/dL

## 2011-05-18 NOTE — Discharge Summary (Signed)
[name], [name] NO.:  [id number]  MEDICAL RECORD NO.:  [id number]  LOCATION:  5011                         FACILITY:  [facility]  PHYSICIAN:  Dyke Brackett, M.D.    DATE OF BIRTH:  July 27, 1951  DATE OF ADMISSION:  04/04/2011 DATE OF DISCHARGE:  04/09/2011                              DISCHARGE SUMMARY   DIAGNOSIS:  Severe osteoarthritis, left hip.  PROCEDURE:  Left total hip arthroplasty.  DISCHARGE SUMMARY:  The patient is a 60 year old woman with a BMI of approximately 35 with severe osteoarthritis of the left hip.  She has failed conservative treatment including anti-inflammatories, pain relievers and cane use, none of which have been significant relief up until now as her pain is worse and interferes with ADLs and sleep.  She wishes to proceed with total hip arthroplasty after discussing risks versus benefits.  Medical history is reviewed and positive for allergies to CT CONTRAST DYE.  Her primary MD is Dr. Yetta Flock who was consulted prior to her admission to ensure a medical workup.  MEDICAL HISTORY:  Significant for: 1. End-stage osteoarthritis. 2. Spinal stenosis. 3. Hypertension. 4. History of breast cancer. 5. History of anemia. 6. Lymphedema of the right lower extremity.  SURGICAL HISTORY:  Positive for gallbladder and tubes tied in 1978 and mastectomy on the right breast in 1998 as well as hospitalizations for __________ x2.  MEDICATIONS AT THE TIME OF ADMISSION:  Azor, hydrochlorothiazide, vitamin D, hydrocodone, and ibuprofen.  ALLERGIES:  The patient is allergic only to IODINE DYE.  SOCIAL HISTORY:  Negative for alcohol or tobacco.  The patient does not live alone.  She lives with her husband in a single story residence with supportive children.  FAMILY HISTORY:  She has positive findings of hypertension, diabetes, heart attack, strokes and cancer throughout her family line.  REVIEW OF SYSTEMS:  Positive for high blood pressure,  anemia and cancer as well as frequent night urination.  PHYSICAL EXAMINATION:  VITAL SIGNS:  Stable at 97.6, pulse 77, respirations 16, blood pressure 154/84 at the time of her admission. She is a 490-pound female. HEAD:  Normocephalic, atraumatic. NECK:  Supple.  Full range of motion. CHEST/LUNGS:  Clear to auscultation. CARDIAC:  Regular rate and rhythm. ABDOMEN:  Soft, nontender. MUSCULOSKELETAL:  Left hip range of motion, 0 degrees of internal rotation, 5 degrees of external rotation, forward flexion of 8 degrees, and extension of 5 degrees.  Otherwise, neurovascularly intact.  HOSPITAL COURSE:  Preop labs including CBC, CMET, chest x-ray, EKG and PT were all within normal limits and on the admission day, the patient was taken to the operating room at [facility] where she underwent a left total hip arthroplasty using a prodigy small statured porous coated hip with a 100 series acetabulum, 54-mm diameter plus four 10-degree, 36- mm liner and a 12-mm ball.  The patient was placed on postoperative PCA for pain control.  She had no drain and physical therapy was begun the first postoperative day with physical therapist's help.  The patient's hospital progression, postoperative day #1, the patient was doing well.  Her pain was well controlled.  Vital signs were stable. PCA was discontinued.  The patient was found to have an UTI on examination of urine, was started on Cipro 500 mg b.i.d.  Wound was clean and dry.  Hemoglobin 9.1, WBC of 3.9.  Other laboratory work is stable.  Postop day 2, the patient remained orthopedically stable and did feel dizzy when she rose that morning.  Hemoglobin had dropped to 6.7 and she was given type and cross, transfused 2 units of blood. Physical Therapy to try to adjust round that schedule.  Postop day #3, the patient had a negative Hemoccult and no other signs of bleeding other than the swollen thigh along the area of the incision.  The patient's  hemoglobin had increased to 9.3, WBC of 7.7, otherwise medically stable, alert and oriented.  Wound clean and dry.  Thigh was moderately swollen.  She had had a positive bowel movement by that day. She was moderately hypokalemic and was given some potassium and physical therapy continuous slowly but steadily.  Postoperative day 4 and 5 basically were uneventful.  She continued to make slow but steady progress in physical therapy.  Hemoglobin remained stable at 9.2, WBC of 6.1.  She was alert and oriented x3.  Wound was clean and dry and she was tolerating physical therapy without difficulty.  Her hypokalemia had resolved and her acute blood loss anemia was improving.  She was judged to be a safe to go home to the care of her family.  She will return to see Dr. Madelon Lips in 9 days' time for followup check.  She should call office to make an appointment.  DIET:  Regular.  ACTIVITY:  Weightbear as tolerated with total hip precautions.  MEDICATION AT THE TIME OF DISCHARGE: 1. Tylenol 1-2 p.o. q.4 hours p.r.n. fever. 2. Cipro 500 mg b.i.d. for a total of 7 days. 3. Colace 100 mg by mouth b.i.d. 4. Lovenox 40 mg subcutaneously q.24. 5. Iron supplementation 325 mg by mouth twice daily with meals. 6. Methocarbamol 500 mg 1 p.o. q.6 hours p.r.n. spasm. 7. Percocet 5/325 one-two tablets by mouth every 4 hours as needed. 8. Azor (amlodipine and olmesartan) 5/20 p.o. daily. 9. Hydrochlorothiazide 1 tablet by mouth as needed.  ACTIVITY AT THE TIME OF DISCHARGE:  Weightbearing as tolerated using total hip precautions.  She should return to see Dr. Madelon Lips in 5-7 days for followup check sooner should she have any difficulty with it.     Laural [name]. [name]   ______________________________ Dyke Brackett, M.D.    JBR/MEDQ  D:  05/08/2011  T:  05/08/2011  Job:  [id number]  Electronically Signed by [name] on 05/08/2011 09:41:04 AM Electronically Signed by Lacretia Nicks. [name] M.D. on  05/18/2011 02:27:41 PM

## 2011-06-20 ENCOUNTER — Other Ambulatory Visit: Payer: Self-pay | Admitting: Hematology & Oncology

## 2011-06-20 ENCOUNTER — Encounter (HOSPITAL_BASED_OUTPATIENT_CLINIC_OR_DEPARTMENT_OTHER): Payer: 59 | Admitting: Hematology & Oncology

## 2011-06-20 DIAGNOSIS — R222 Localized swelling, mass and lump, trunk: Secondary | ICD-10-CM

## 2011-06-20 DIAGNOSIS — Z853 Personal history of malignant neoplasm of breast: Secondary | ICD-10-CM

## 2011-06-20 LAB — CBC WITH DIFFERENTIAL (CANCER CENTER ONLY)
BASO#: 0 10*3/uL (ref 0.0–0.2)
BASO%: 0.4 % (ref 0.0–2.0)
EOS%: 1.7 % (ref 0.0–7.0)
HCT: 35 % (ref 34.8–46.6)
HGB: 11.5 g/dL — ABNORMAL LOW (ref 11.6–15.9)
LYMPH#: 1.7 10*3/uL (ref 0.9–3.3)
LYMPH%: 33 % (ref 14.0–48.0)
MCHC: 32.9 g/dL (ref 32.0–36.0)
MCV: 90 fL (ref 81–101)
NEUT%: 57.9 % (ref 39.6–80.0)
RDW: 13.7 % (ref 11.1–15.7)

## 2011-06-20 LAB — FERRITIN: Ferritin: 370 ng/mL — ABNORMAL HIGH (ref 10–291)

## 2011-06-20 LAB — IRON AND TIBC
TIBC: 248 ug/dL — ABNORMAL LOW (ref 250–470)
UIBC: 188 ug/dL (ref 125–400)

## 2011-06-20 LAB — RETICULOCYTES (CHCC): RBC.: 3.94 MIL/uL (ref 3.87–5.11)

## 2011-06-23 ENCOUNTER — Ambulatory Visit (INDEPENDENT_AMBULATORY_CARE_PROVIDER_SITE_OTHER): Payer: 59 | Admitting: General Surgery

## 2011-06-23 ENCOUNTER — Encounter (INDEPENDENT_AMBULATORY_CARE_PROVIDER_SITE_OTHER): Payer: Self-pay | Admitting: General Surgery

## 2011-06-23 VITALS — BP 136/88 | HR 70

## 2011-06-23 DIAGNOSIS — C50911 Malignant neoplasm of unspecified site of right female breast: Secondary | ICD-10-CM

## 2011-06-23 DIAGNOSIS — C50919 Malignant neoplasm of unspecified site of unspecified female breast: Secondary | ICD-10-CM

## 2011-06-23 HISTORY — DX: Malignant neoplasm of unspecified site of right female breast: C50.911

## 2011-06-23 NOTE — Progress Notes (Signed)
Operation:  Right MRM 07/1997.  Excision of right chest wall nodule July 2002   HPI:  [name] is here for follow up of her right breast cancer. She has a firm nodular area in the right chest wall that is unchanged from 2010/11/29. It is most prominent when she raises her right arm over her head. It has felt like a bony prominence to me in the past. Bone scan from December 2011 was negative for disease here.  She had left hip replacement surgery in June and is doing well from this. She denies any masses in her left breast. No adenopathy.  PE: General-looks well and in no acute distress. Walking with a slight limp.  Right chest wall-healed long scar and short scar. Firm nodular area noted superior to the long chest wall scar that is unchanged from November 29, 2010. Most pronounced when she raises her right arm above her head.  Feels like a bony extension of one of her ribs.  Nodes-no palpable cervical, supraclavicular, axillary adenopathy.  Extremities-right upper extremity lymphedema noted.  Assessment:  Right breast cancer with chest wall recurrence-has a bony prominence and is unchanged. However she she is quite worried about this.    Plan:  Right chest wall exploration and possible biopsy under local anesthesia. Procedure and risks were discussed with her. Risks include but are not limited to bleeding, wound healing, and infection.  She understands this and wants to proceed.

## 2011-07-01 ENCOUNTER — Other Ambulatory Visit (INDEPENDENT_AMBULATORY_CARE_PROVIDER_SITE_OTHER): Payer: Self-pay | Admitting: General Surgery

## 2011-07-01 DIAGNOSIS — C50919 Malignant neoplasm of unspecified site of unspecified female breast: Secondary | ICD-10-CM

## 2011-07-01 DIAGNOSIS — C779 Secondary and unspecified malignant neoplasm of lymph node, unspecified: Secondary | ICD-10-CM

## 2011-07-01 HISTORY — PX: OTHER SURGICAL HISTORY: SHX169

## 2011-07-07 ENCOUNTER — Telehealth (INDEPENDENT_AMBULATORY_CARE_PROVIDER_SITE_OTHER): Payer: Self-pay | Admitting: General Surgery

## 2011-07-07 NOTE — Telephone Encounter (Signed)
I spoke with [name] regarding her pathology. This demonstrates mammary carcinoma. This nodule had been there for at least a year and had not changed.  I spoke with Dr. Myna Hidalgo about this. He is going to see her back. I talked with the pathologist and acetabulum prognostic indicators on the specimen.

## 2011-07-10 ENCOUNTER — Other Ambulatory Visit: Payer: Self-pay | Admitting: Hematology & Oncology

## 2011-07-10 DIAGNOSIS — C50919 Malignant neoplasm of unspecified site of unspecified female breast: Secondary | ICD-10-CM

## 2011-07-10 DIAGNOSIS — C799 Secondary malignant neoplasm of unspecified site: Secondary | ICD-10-CM

## 2011-07-16 ENCOUNTER — Ambulatory Visit
Admission: RE | Admit: 2011-07-16 | Discharge: 2011-07-16 | Disposition: A | Discharge status: Home / Self Care | Payer: 59 | Source: Ambulatory Visit | Attending: Radiation Oncology | Admitting: Radiation Oncology

## 2011-07-16 DIAGNOSIS — C50919 Malignant neoplasm of unspecified site of unspecified female breast: Secondary | ICD-10-CM | POA: Insufficient documentation

## 2011-07-21 ENCOUNTER — Encounter (HOSPITAL_COMMUNITY)
Admission: RE | Admit: 2011-07-21 | Discharge: 2011-07-21 | Disposition: A | Discharge status: Home / Self Care | Payer: 59 | Source: Ambulatory Visit | Attending: Hematology & Oncology | Admitting: Hematology & Oncology

## 2011-07-21 DIAGNOSIS — C799 Secondary malignant neoplasm of unspecified site: Secondary | ICD-10-CM

## 2011-07-21 DIAGNOSIS — Z9010 Acquired absence of unspecified breast and nipple: Secondary | ICD-10-CM | POA: Insufficient documentation

## 2011-07-21 DIAGNOSIS — C50919 Malignant neoplasm of unspecified site of unspecified female breast: Secondary | ICD-10-CM | POA: Insufficient documentation

## 2011-07-21 MED ORDER — FLUDEOXYGLUCOSE F - 18 (FDG) INJECTION
18.6000 | Freq: Once | INTRAVENOUS | Status: AC | PRN
  Administered 2011-07-21: 18.6 via INTRAVENOUS

## 2011-07-22 ENCOUNTER — Other Ambulatory Visit: Payer: Self-pay | Admitting: Hematology & Oncology

## 2011-07-22 ENCOUNTER — Encounter (HOSPITAL_BASED_OUTPATIENT_CLINIC_OR_DEPARTMENT_OTHER): Payer: 59 | Admitting: Hematology & Oncology

## 2011-07-22 DIAGNOSIS — Z9010 Acquired absence of unspecified breast and nipple: Secondary | ICD-10-CM

## 2011-07-22 DIAGNOSIS — C50919 Malignant neoplasm of unspecified site of unspecified female breast: Secondary | ICD-10-CM

## 2011-07-22 DIAGNOSIS — D509 Iron deficiency anemia, unspecified: Secondary | ICD-10-CM

## 2011-07-22 DIAGNOSIS — Z170 Estrogen receptor positive status [ER+]: Secondary | ICD-10-CM

## 2011-07-22 DIAGNOSIS — Z853 Personal history of malignant neoplasm of breast: Secondary | ICD-10-CM

## 2011-07-22 DIAGNOSIS — C44599 Other specified malignant neoplasm of skin of other part of trunk: Secondary | ICD-10-CM

## 2011-07-22 LAB — CBC WITH DIFFERENTIAL (CANCER CENTER ONLY)
BASO#: 0 10*3/uL (ref 0.0–0.2)
BASO%: 0.3 % (ref 0.0–2.0)
EOS%: 1 % (ref 0.0–7.0)
HCT: 34.3 % — ABNORMAL LOW (ref 34.8–46.6)
HGB: 11.4 g/dL — ABNORMAL LOW (ref 11.6–15.9)
LYMPH%: 36 % (ref 14.0–48.0)
MCHC: 33.2 g/dL (ref 32.0–36.0)
MCV: 91 fL (ref 81–101)
MONO#: 0.5 10*3/uL (ref 0.1–0.9)
NEUT%: 54.9 % (ref 39.6–80.0)
RDW: 13.4 % (ref 11.1–15.7)

## 2011-07-22 LAB — COMPREHENSIVE METABOLIC PANEL
BUN: 16 mg/dL (ref 6–23)
CO2: 27 mEq/L (ref 19–32)
Creatinine, Ser: 0.68 mg/dL (ref 0.50–1.10)
Glucose, Bld: 96 mg/dL (ref 70–99)
Total Bilirubin: 0.4 mg/dL (ref 0.3–1.2)
Total Protein: 7 g/dL (ref 6.0–8.3)

## 2011-07-22 LAB — CANCER ANTIGEN 27.29: CA 27.29: 23 U/mL (ref 0–39)

## 2011-07-29 ENCOUNTER — Encounter (INDEPENDENT_AMBULATORY_CARE_PROVIDER_SITE_OTHER): Payer: 59 | Admitting: General Surgery

## 2011-09-10 ENCOUNTER — Ambulatory Visit (HOSPITAL_BASED_OUTPATIENT_CLINIC_OR_DEPARTMENT_OTHER): Payer: 59 | Admitting: Hematology & Oncology

## 2011-09-10 ENCOUNTER — Other Ambulatory Visit: Payer: Self-pay | Admitting: Hematology & Oncology

## 2011-09-10 ENCOUNTER — Other Ambulatory Visit (HOSPITAL_BASED_OUTPATIENT_CLINIC_OR_DEPARTMENT_OTHER): Payer: 59 | Admitting: Lab

## 2011-09-10 ENCOUNTER — Telehealth: Payer: Self-pay | Admitting: *Deleted

## 2011-09-10 VITALS — BP 146/85 | HR 74 | Temp 96.8°F | Ht 64.0 in | Wt 201.0 lb

## 2011-09-10 DIAGNOSIS — C50919 Malignant neoplasm of unspecified site of unspecified female breast: Secondary | ICD-10-CM

## 2011-09-10 DIAGNOSIS — C44599 Other specified malignant neoplasm of skin of other part of trunk: Secondary | ICD-10-CM

## 2011-09-10 DIAGNOSIS — D509 Iron deficiency anemia, unspecified: Secondary | ICD-10-CM

## 2011-09-10 DIAGNOSIS — Z853 Personal history of malignant neoplasm of breast: Secondary | ICD-10-CM

## 2011-09-10 DIAGNOSIS — M810 Age-related osteoporosis without current pathological fracture: Secondary | ICD-10-CM

## 2011-09-10 LAB — CBC WITH DIFFERENTIAL (CANCER CENTER ONLY)
BASO#: 0 10*3/uL (ref 0.0–0.2)
BASO%: 0.3 % (ref 0.0–2.0)
EOS%: 2 % (ref 0.0–7.0)
HGB: 11.2 g/dL — ABNORMAL LOW (ref 11.6–15.9)
LYMPH#: 1.8 10*3/uL (ref 0.9–3.3)
MCH: 30.2 pg (ref 26.0–34.0)
MCHC: 33.1 g/dL (ref 32.0–36.0)
MONO%: 7.5 % (ref 0.0–13.0)
NEUT#: 3.6 10*3/uL (ref 1.5–6.5)
RDW: 13.6 % (ref 11.1–15.7)

## 2011-09-10 LAB — COMPREHENSIVE METABOLIC PANEL
ALT: 13 U/L (ref 0–35)
AST: 13 U/L (ref 0–37)
Alkaline Phosphatase: 68 U/L (ref 39–117)
BUN: 16 mg/dL (ref 6–23)
Creatinine, Ser: 0.65 mg/dL (ref 0.50–1.10)
Potassium: 3.6 mEq/L (ref 3.5–5.3)

## 2011-09-10 NOTE — Progress Notes (Signed)
This office note has been dictated.

## 2011-09-10 NOTE — Progress Notes (Deleted)
DIAGNOSIS:  Locally recurrent ductal carcinoma of the right breast.  CURRENT THERAPY: 1. The patient is status post radiation therapy to the right chest     wall. 2. Aromasin 25 mg p.o. daily.  INTERIM HISTORY:  [name] comes in for followup.  She completed radiation therapy a couple weeks ago.  She did this down in [place]. She did real well with this.  She really had very little toxicity.  She continued to work.  This was very convenient for her because she worked only 5 minutes from the cancer center down there.  She is still a little tired.  She is getting her energy back.  She had a good Thanksgiving.  Her appetite has been good.  She has not had any problems with her hip.  She did have hip surgery.  I think it was for her right hip.  She has recovered from this quite well.  She has not noted any problems with nausea or vomiting.  There is no headache.  There has been no change in bowel or bladder habits.  PHYSICAL EXAM:  General:  This is a well-developed well-nourished white female in no obvious distress.  Vital Signs:  Temperature 96.8, pulse 74, respiratory rate 18, blood pressure 146/85.  Weight is 201. Head/Neck:  Exam shows a normocephalic, atraumatic skull.  There are no ocular or oral lesions.  There are no palpable cervical or supraclavicular lymph nodes.  Lungs:  Clear bilaterally.  Cardiac: Regular rate and rhythm with a normal S1, S2.  There are no murmurs, rubs or bruits.  Chest wall:  Exam shows erythema on the right anterior chest wall.  There is no skin breakdown.  Her left breast is unremarkable.  There is no left breast mass.  There is no left axillary adenopathy.  Abdomen:  Soft.  Good bowel sounds.  There is no fluid wave.  There is no palpable hepatosplenomegaly.  Back:  No tenderness over the spine, ribs, or hips.  Extremities:  No clubbing, cyanosis or edema.  She does have chronic lymphedema of the right arm.  LABORATORY STUDIES:  White cell  count is 6, hemoglobin 11.2, hematocrit 33.8, platelet count 196.  MCV is 91.  IMPRESSION/PLAN:  [name] is a 60 year old white female with recurrent ductal carcinoma of the right breast.  She had already had a mastectomy. She had the recurrence biopsied back in September.  There is no evidence of recurrence elsewhere.  She subsequently received radiation therapy to the right anterior chest wall.  She is on Aromasin.  Hopefully, we will find that she will not have any issues with respect to her breast cancer.  Hopefully, this is just a local issue that has been treated with surgery and followed up with radiation.  I want to see her back in 3 months.    ______________________________ Josph Macho, M.D. PRE/MEDQ  D:  09/10/2011  T:  09/10/2011  Job:  [id number]

## 2011-09-10 NOTE — Telephone Encounter (Signed)
Mailed 3-13 schedule °

## 2011-09-23 ENCOUNTER — Ambulatory Visit (INDEPENDENT_AMBULATORY_CARE_PROVIDER_SITE_OTHER): Payer: 59 | Admitting: General Surgery

## 2011-09-23 ENCOUNTER — Encounter (INDEPENDENT_AMBULATORY_CARE_PROVIDER_SITE_OTHER): Payer: Self-pay | Admitting: General Surgery

## 2011-09-23 VITALS — BP 144/96 | HR 70 | Temp 96.9°F | Resp 16 | Ht 64.0 in | Wt 203.0 lb

## 2011-09-23 DIAGNOSIS — C50911 Malignant neoplasm of unspecified site of right female breast: Secondary | ICD-10-CM

## 2011-09-23 DIAGNOSIS — C50919 Malignant neoplasm of unspecified site of unspecified female breast: Secondary | ICD-10-CM

## 2011-09-23 NOTE — Patient Instructions (Signed)
Call us if you discover any more nodules.

## 2011-09-23 NOTE — Progress Notes (Signed)
She is here for followup after her second chest wall recurrence of her right breast cancer. She is many years out from a right modified radical mastectomy in 10 years ago had a chest wall recurrence. This is her second chest wall recurrence. She has completed her radiation therapy and is now on Aromasin. She says initially the area in her chest wall was quite sensitive but is improving. She's been using topical agents. She denies adenopathy.  Physical exam: [name] looks well and is in no acute distress.  Right chest-well-healed scars are present. Significant skin breakdown the radiation changes are noted.  Lymph nodes-no palpable cervical, supraclavicular, or axillary adenopathy is present.  Extremities-right upper extremity lymph edema is noted.  Assessment: Right breast cancer with 2 chest wall recurrences following modified radical mastectomy.  Plan: Return visit 3 months.

## 2011-10-22 ENCOUNTER — Ambulatory Visit: Payer: 59 | Admitting: Hematology & Oncology

## 2011-10-22 ENCOUNTER — Other Ambulatory Visit: Payer: 59 | Admitting: Lab

## 2011-12-11 NOTE — Progress Notes (Signed)
DIAGNOSIS:  Locally recurrent ductal carcinoma of the right breast.  CURRENT THERAPY: 1. The patient is status post radiation therapy to the right chest     wall. 2. Aromasin 25 mg p.o. daily.  INTERIM HISTORY:  Ms. Baize comes in for followup.  She completed radiation therapy a couple weeks ago.  She did this down in [place]. She did real well with this.  She really had very little toxicity.  She continued to work.  This was very convenient for her because she worked only 5 minutes from the cancer center down there.  She is still a little tired.  She is getting her energy back.  She had a good Thanksgiving.  Her appetite has been good.  She has not had any problems with her hip.  She did have hip surgery.  I think it was for her right hip.  She has recovered from this quite well.  She has not noted any problems with nausea or vomiting.  There is no headache.  There has been no change in bowel or bladder habits.  PHYSICAL EXAM:  General:  This is a well-developed well-nourished white female in no obvious distress.  Vital Signs:  Temperature 96.8, pulse 74, respiratory rate 18, blood pressure 146/85.  Weight is 201. Head/Neck:  Exam shows a normocephalic, atraumatic skull.  There are no ocular or oral lesions.  There are no palpable cervical or supraclavicular lymph nodes.  Lungs:  Clear bilaterally.  Cardiac: Regular rate and rhythm with a normal S1, S2.  There are no murmurs, rubs or bruits.  Chest wall:  Exam shows erythema on the right anterior chest wall.  There is no skin breakdown.  Her left breast is unremarkable.  There is no left breast mass.  There is no left axillary adenopathy.  Abdomen:  Soft.  Good bowel sounds.  There is no fluid wave.  There is no palpable hepatosplenomegaly.  Back:  No tenderness over the spine, ribs, or hips.  Extremities:  No clubbing, cyanosis or edema.  She does have chronic lymphedema of the right arm.  LABORATORY STUDIES:  White cell  count is 6, hemoglobin 11.2, hematocrit 33.8, platelet count 196.  MCV is 91.  IMPRESSION/PLAN:  Ms. Dollens is a 61-year-old white female with recurrent ductal carcinoma of the right breast.  She had already had a mastectomy. She had the recurrence biopsied back in September.  There is no evidence of recurrence elsewhere.  She subsequently received radiation therapy to the right anterior chest wall.  She is on Aromasin.  Hopefully, we will find that she will not have any issues with respect to her breast cancer.  Hopefully, this is just a local issue that has been treated with surgery and followed up with radiation.  I want to see her back in 3 months.    ______________________________ Denia Mcvicar R Payslee Bateson, M.D. PRE/MEDQ  D:  09/10/2011  T:  09/10/2011  Job:  [id number] 

## 2011-12-15 ENCOUNTER — Other Ambulatory Visit (HOSPITAL_BASED_OUTPATIENT_CLINIC_OR_DEPARTMENT_OTHER): Payer: BC Managed Care – PPO | Admitting: Lab

## 2011-12-15 ENCOUNTER — Ambulatory Visit (HOSPITAL_BASED_OUTPATIENT_CLINIC_OR_DEPARTMENT_OTHER): Payer: BC Managed Care – PPO | Admitting: Hematology & Oncology

## 2011-12-15 DIAGNOSIS — M810 Age-related osteoporosis without current pathological fracture: Secondary | ICD-10-CM

## 2011-12-15 DIAGNOSIS — C44599 Other specified malignant neoplasm of skin of other part of trunk: Secondary | ICD-10-CM

## 2011-12-15 DIAGNOSIS — C50919 Malignant neoplasm of unspecified site of unspecified female breast: Secondary | ICD-10-CM

## 2011-12-15 DIAGNOSIS — E559 Vitamin D deficiency, unspecified: Secondary | ICD-10-CM

## 2011-12-15 LAB — CBC WITH DIFFERENTIAL (CANCER CENTER ONLY)
BASO#: 0 10*3/uL (ref 0.0–0.2)
EOS%: 2.1 % (ref 0.0–7.0)
HCT: 35.5 % (ref 34.8–46.6)
HGB: 11.5 g/dL — ABNORMAL LOW (ref 11.6–15.9)
LYMPH%: 28.3 % (ref 14.0–48.0)
MCH: 29.9 pg (ref 26.0–34.0)
MCHC: 32.4 g/dL (ref 32.0–36.0)
MONO%: 8 % (ref 0.0–13.0)
NEUT%: 61.3 % (ref 39.6–80.0)
RDW: 13.1 % (ref 11.1–15.7)

## 2011-12-15 NOTE — Progress Notes (Signed)
This office note has been dictated.

## 2011-12-16 LAB — IRON AND TIBC
%SAT: 19 % — ABNORMAL LOW (ref 20–55)
Iron: 46 ug/dL (ref 42–145)
TIBC: 243 ug/dL — ABNORMAL LOW (ref 250–470)
UIBC: 197 ug/dL (ref 125–400)

## 2011-12-16 LAB — VITAMIN D 25 HYDROXY (VIT D DEFICIENCY, FRACTURES): Vit D, 25-Hydroxy: 38 ng/mL (ref 30–89)

## 2011-12-16 LAB — COMPREHENSIVE METABOLIC PANEL WITH GFR
ALT: 11 U/L (ref 0–35)
AST: 13 U/L (ref 0–37)
Albumin: 4 g/dL (ref 3.5–5.2)
Alkaline Phosphatase: 63 U/L (ref 39–117)
BUN: 12 mg/dL (ref 6–23)
CO2: 28 meq/L (ref 19–32)
Calcium: 9.3 mg/dL (ref 8.4–10.5)
Chloride: 107 meq/L (ref 96–112)
Creatinine, Ser: 0.63 mg/dL (ref 0.50–1.10)
Glucose, Bld: 88 mg/dL (ref 70–99)
Potassium: 3.8 meq/L (ref 3.5–5.3)
Sodium: 146 meq/L — ABNORMAL HIGH (ref 135–145)
Total Bilirubin: 0.4 mg/dL (ref 0.3–1.2)
Total Protein: 6.5 g/dL (ref 6.0–8.3)

## 2011-12-16 LAB — FERRITIN: Ferritin: 292 ng/mL — ABNORMAL HIGH (ref 10–291)

## 2011-12-16 LAB — LACTATE DEHYDROGENASE: LDH: 168 U/L (ref 94–250)

## 2011-12-16 NOTE — Progress Notes (Signed)
DIAGNOSIS:  Locally recurrent ductal carcinoma of the right breast.  CURRENT THERAPY: 1. Status post radiation therapy. 2. Aromasin 25 mg p.o. daily.  INTERVAL HISTORY:  [name] comes in for followup.  She says she feels great.  She is working.  There is no fatigue or weakness.  She does have some arthralgias.  She is on vitamin D weekly.  She is trying to lose a little bit of weight.  She has had no fevers, sweats or chills.  She has had no leg swelling.  She said her hip is doing a lot better.  Her right hip was operated on about a year ago, I think.  PHYSICAL EXAMINATION:  This is a well-developed well-nourished white female in no obvious distress.  Vital signs:  Temperature 97.4, pulse 74, respiratory rate 18, blood pressure 146/76.  Weight is 195.  Head and neck:  Normocephalic, atraumatic skull.  There are no ocular or oral lesions.  There are no palpable cervical, supraclavicular lymph nodes. Lungs: Clear bilaterally.  Cardiac:  Regular rate and rhythm with a normal S1, S2.  There are no murmurs, rubs or bruits.  Breast exam shows left breast with no masses, edema or erythema.  There is no left axillary adenopathy.  Right chest wall shows hyperpigmentation from radiation.  There is no skin breakdown.  No nodularity is noted on right anterior chest wall.  There is no right axillary adenopathy.  Abdomen: Soft with good bowel sounds.  There is no palpable abdominal mass. There is no fluid wave.  There is no palpable hepatosplenomegaly bags. Back: No tenderness over the spine, ribs, or hips.  Extremities:  No clubbing, cyanosis or edema.  Neurologic:  No focal neurological deficits.  Skin:  No  rashes,  ecchymosis or petechia.  LABORATORY STUDIES:  White cell count is 5.7, hemoglobin 11.5, hematocrit 35.5, platelet count 190.  IMPRESSION:  [name] is a 61 year old white female with locally recurrent ductal carcinoma of the right breast.  She underwent excision. She then  underwent radiation therapy for this.  She tolerated this well.  Will go ahead and plan to get her back in 3 months' time now.  I do not see that we need any blood work in between visits.    ______________________________ [name], M.D. PRE/MEDQ  D:  12/15/2011  T:  12/16/2011  Job:  [id number]

## 2012-01-06 ENCOUNTER — Ambulatory Visit (INDEPENDENT_AMBULATORY_CARE_PROVIDER_SITE_OTHER): Payer: BC Managed Care – PPO | Admitting: General Surgery

## 2012-01-06 ENCOUNTER — Encounter (INDEPENDENT_AMBULATORY_CARE_PROVIDER_SITE_OTHER): Payer: Self-pay | Admitting: General Surgery

## 2012-01-06 VITALS — BP 153/70 | HR 83 | Temp 98.4°F | Ht 64.5 in | Wt 196.8 lb

## 2012-01-06 DIAGNOSIS — Z853 Personal history of malignant neoplasm of breast: Secondary | ICD-10-CM

## 2012-01-06 NOTE — Progress Notes (Signed)
Operation: Right modified radical mastectomy  Date: July 24, 1997   Hormone receptor status: Positive  HPI: [name] is here for long-term followup of a right breast cancer. She had her second right chest wall recurrence recently and has completed her radiation therapy. She is now on a Aromasin.  She declines any new nodules on the left chest wall or masses in the left breast. No adenopathy. Lymphedema in the right arm is the same.  PE: Right chest wall-transverse scar with radiation changes of the skin; no nodules palpable. Left breast-no palpable masses or suspicious skin changes; lateral scar noted.  Lymph nodes-no axillary or supraclavicular adenopathy.  Assessment:  Right breast cancer with 2 chest wall recurrences. No clinical evidence of any further chest wall recurrences following her radiation therapy. Lymphedema of the right arm is unchanged.  Plan: Return visit 6 months.

## 2012-01-06 NOTE — Patient Instructions (Signed)
Call if you want a referral for the lymphedema

## 2012-02-03 ENCOUNTER — Other Ambulatory Visit: Payer: Self-pay | Admitting: Hematology & Oncology

## 2012-02-03 DIAGNOSIS — Z1231 Encounter for screening mammogram for malignant neoplasm of breast: Secondary | ICD-10-CM

## 2012-03-06 IMAGING — CR DG CHEST 2V
2 series · 2 of 2 positions shown · non-contrast
Comparison: Chest x-ray of 07/16/2004

CLINICAL DATA: Preop for hip replacement

CHEST - 2 VIEW

[view not recorded (1 of 2)]
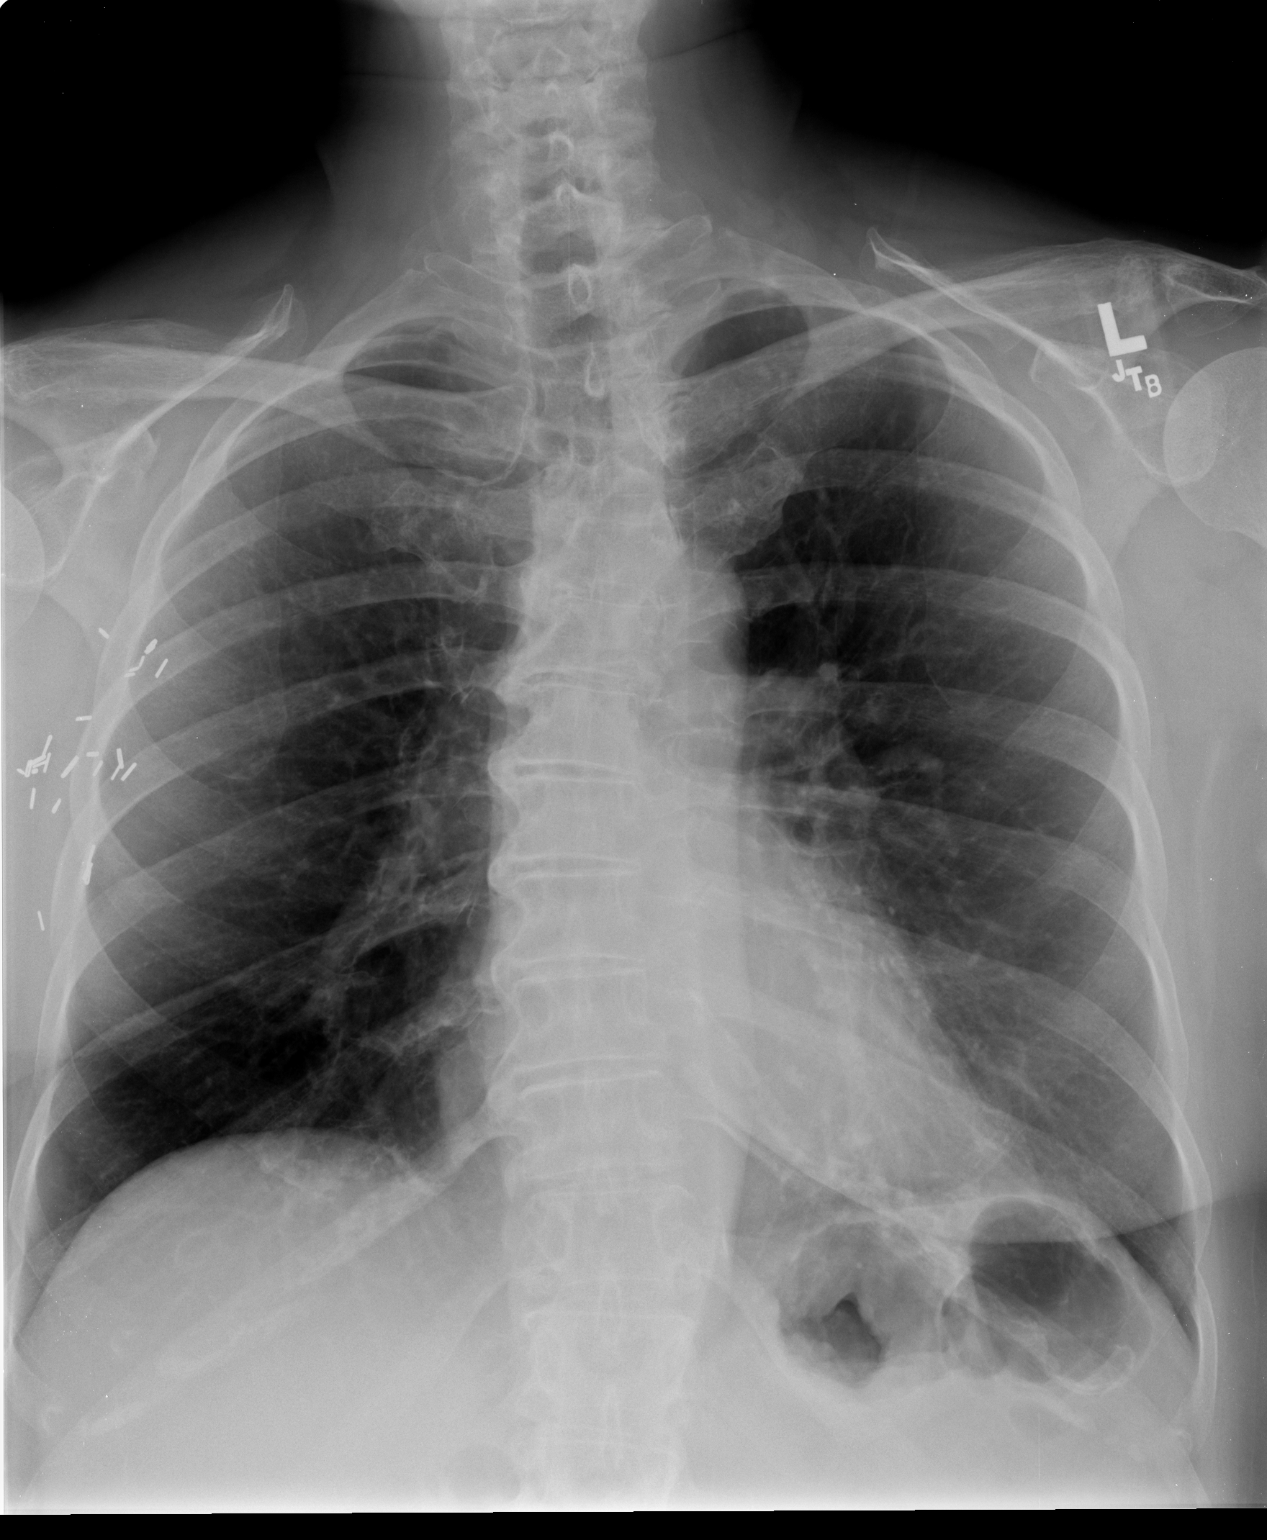

[view not recorded (2 of 2)]
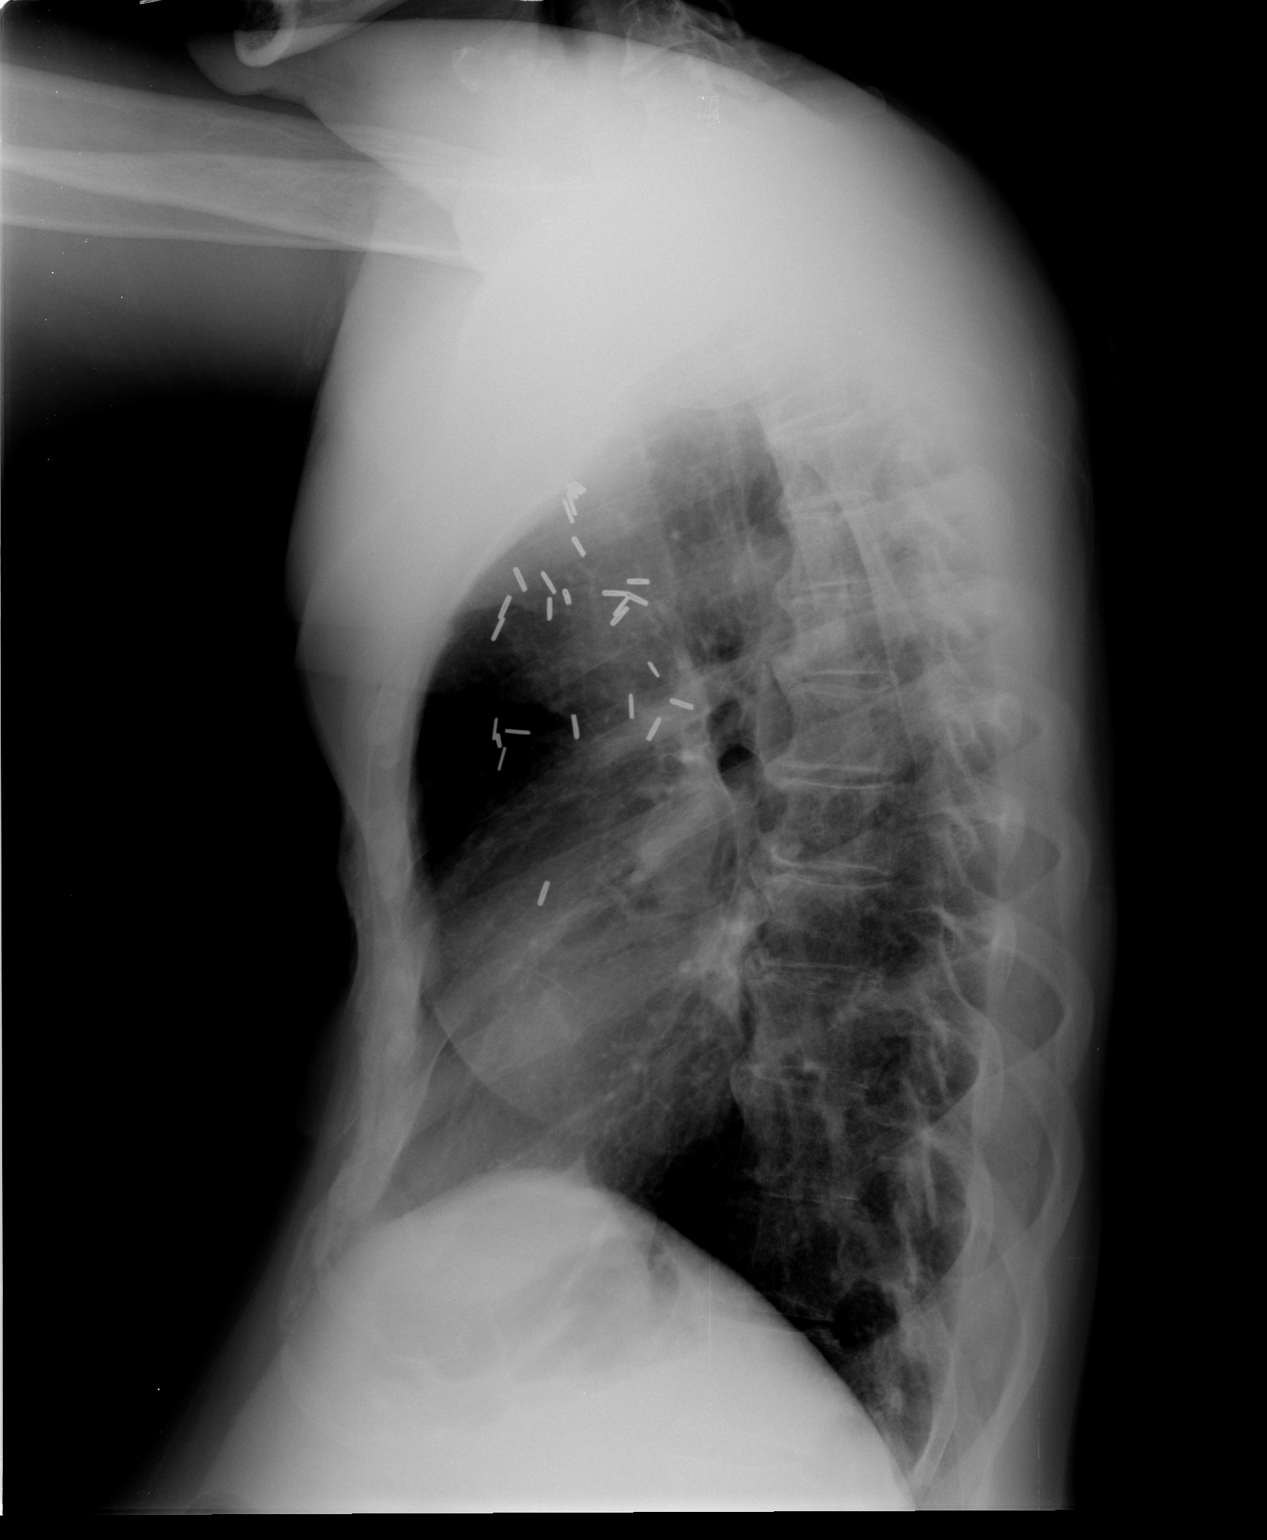

[2 of 2 positions shown; findings below may reference images not displayed]

FINDINGS: The lungs are clear.  Mediastinal contours are stable.
The heart is within normal limits in size.  Surgical clips overlie
the right axilla from prior right mastectomy.  There are
degenerative changes throughout the thoracic spine.
IMPRESSION: Stable chest x-ray.  No active lung disease.

## 2012-03-10 ENCOUNTER — Ambulatory Visit (HOSPITAL_COMMUNITY): Payer: BC Managed Care – PPO

## 2012-03-13 IMAGING — CR DG HIP 1V PORT*L*
1 series · 1 of 1 positions shown · non-contrast
Comparison: None.

CLINICAL DATA: Osteoarthritis of the left hip.  Total hip
prosthesis insertion.

PORTABLE LEFT HIP - 1 VIEW

[cross table lat hip]
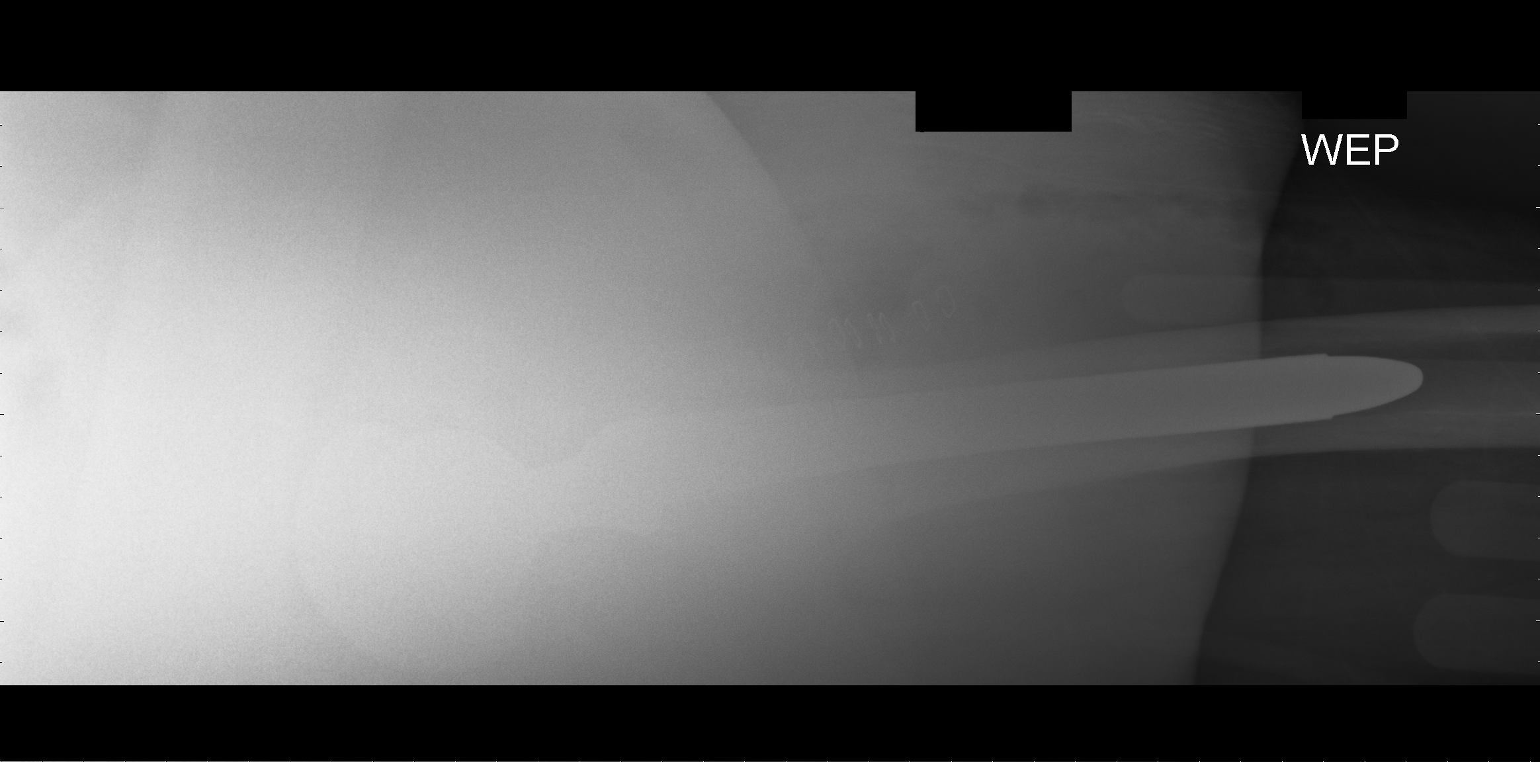

[1 of 1 positions shown; findings below may reference images not displayed]

FINDINGS: Acetabular and femoral components of the prosthesis
appear in excellent position in the lateral projection.
IMPRESSION: Satisfactory appearance of the left hip after total hip prosthesis
insertion.

## 2012-03-13 IMAGING — CR DG PORTABLE PELVIS
2 series · 2 of 2 positions shown · non-contrast
Comparison: None.

CLINICAL DATA: Osteoarthritis of the left hip.

PORTABLE PELVIS

[AP (1 of 2)]
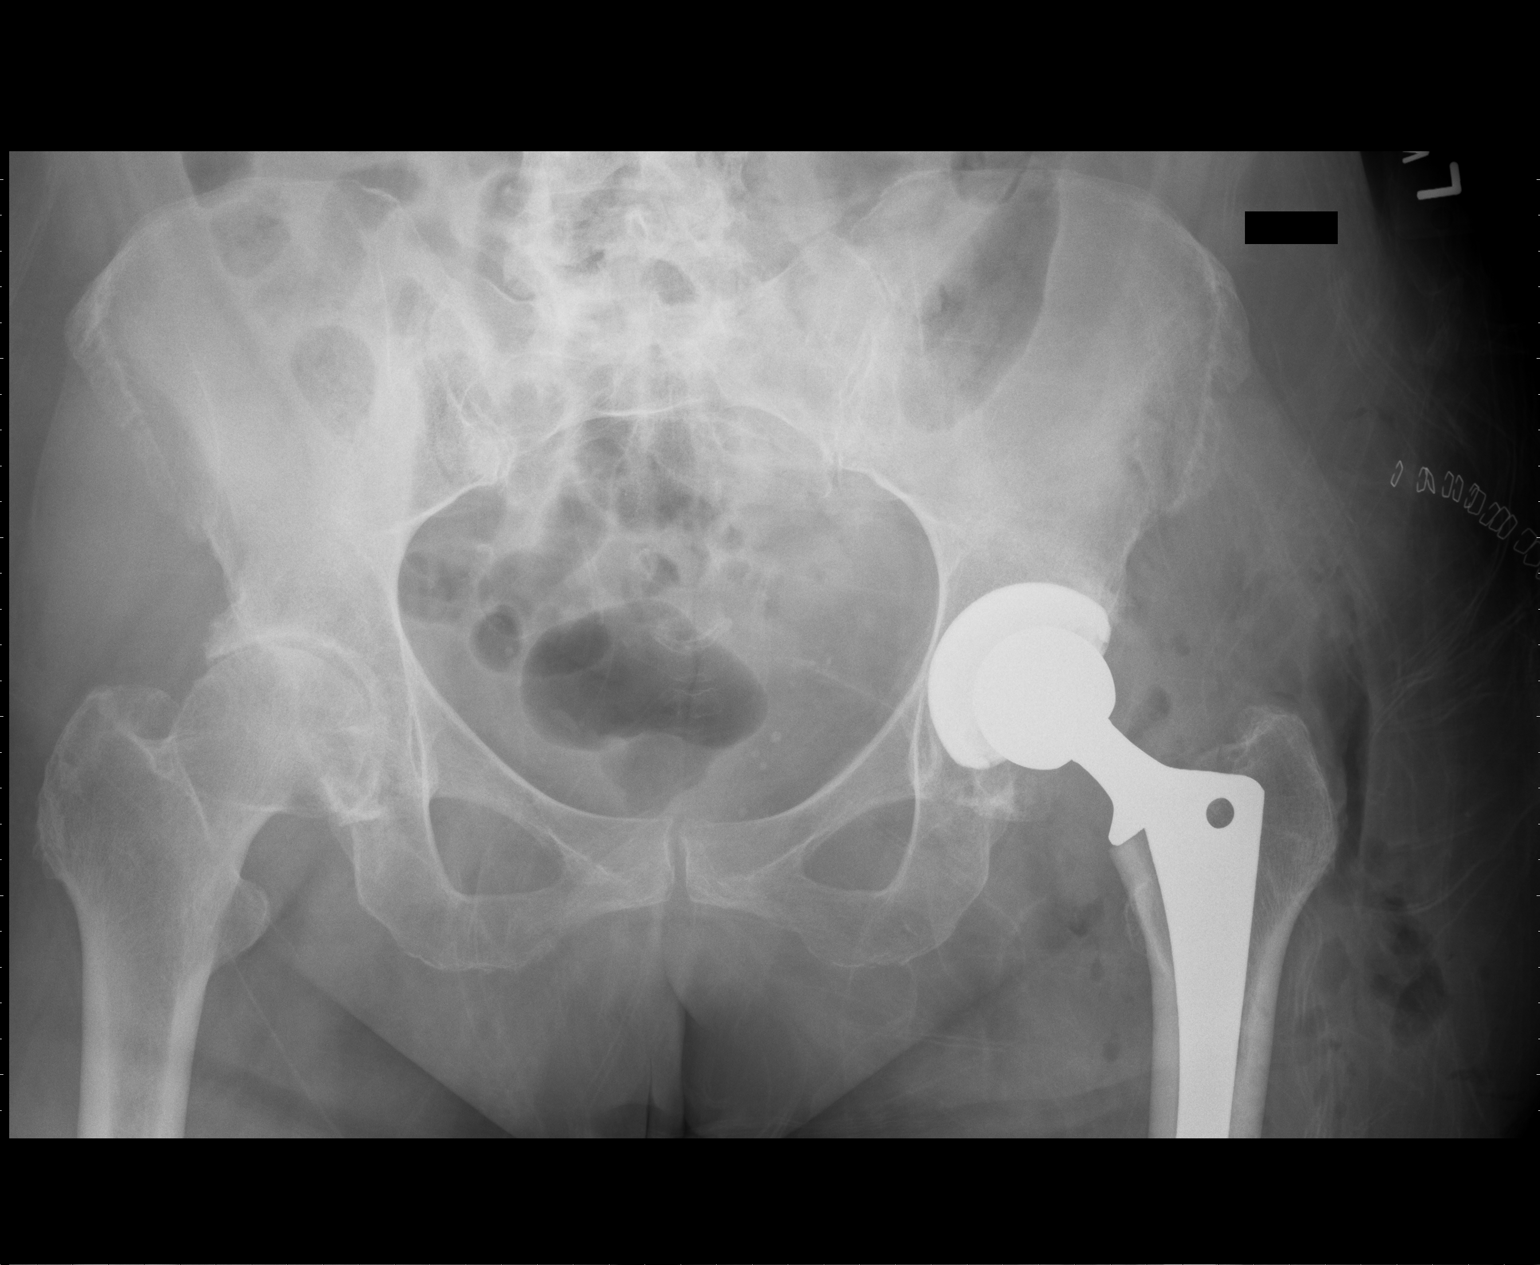

[AP (2 of 2)]
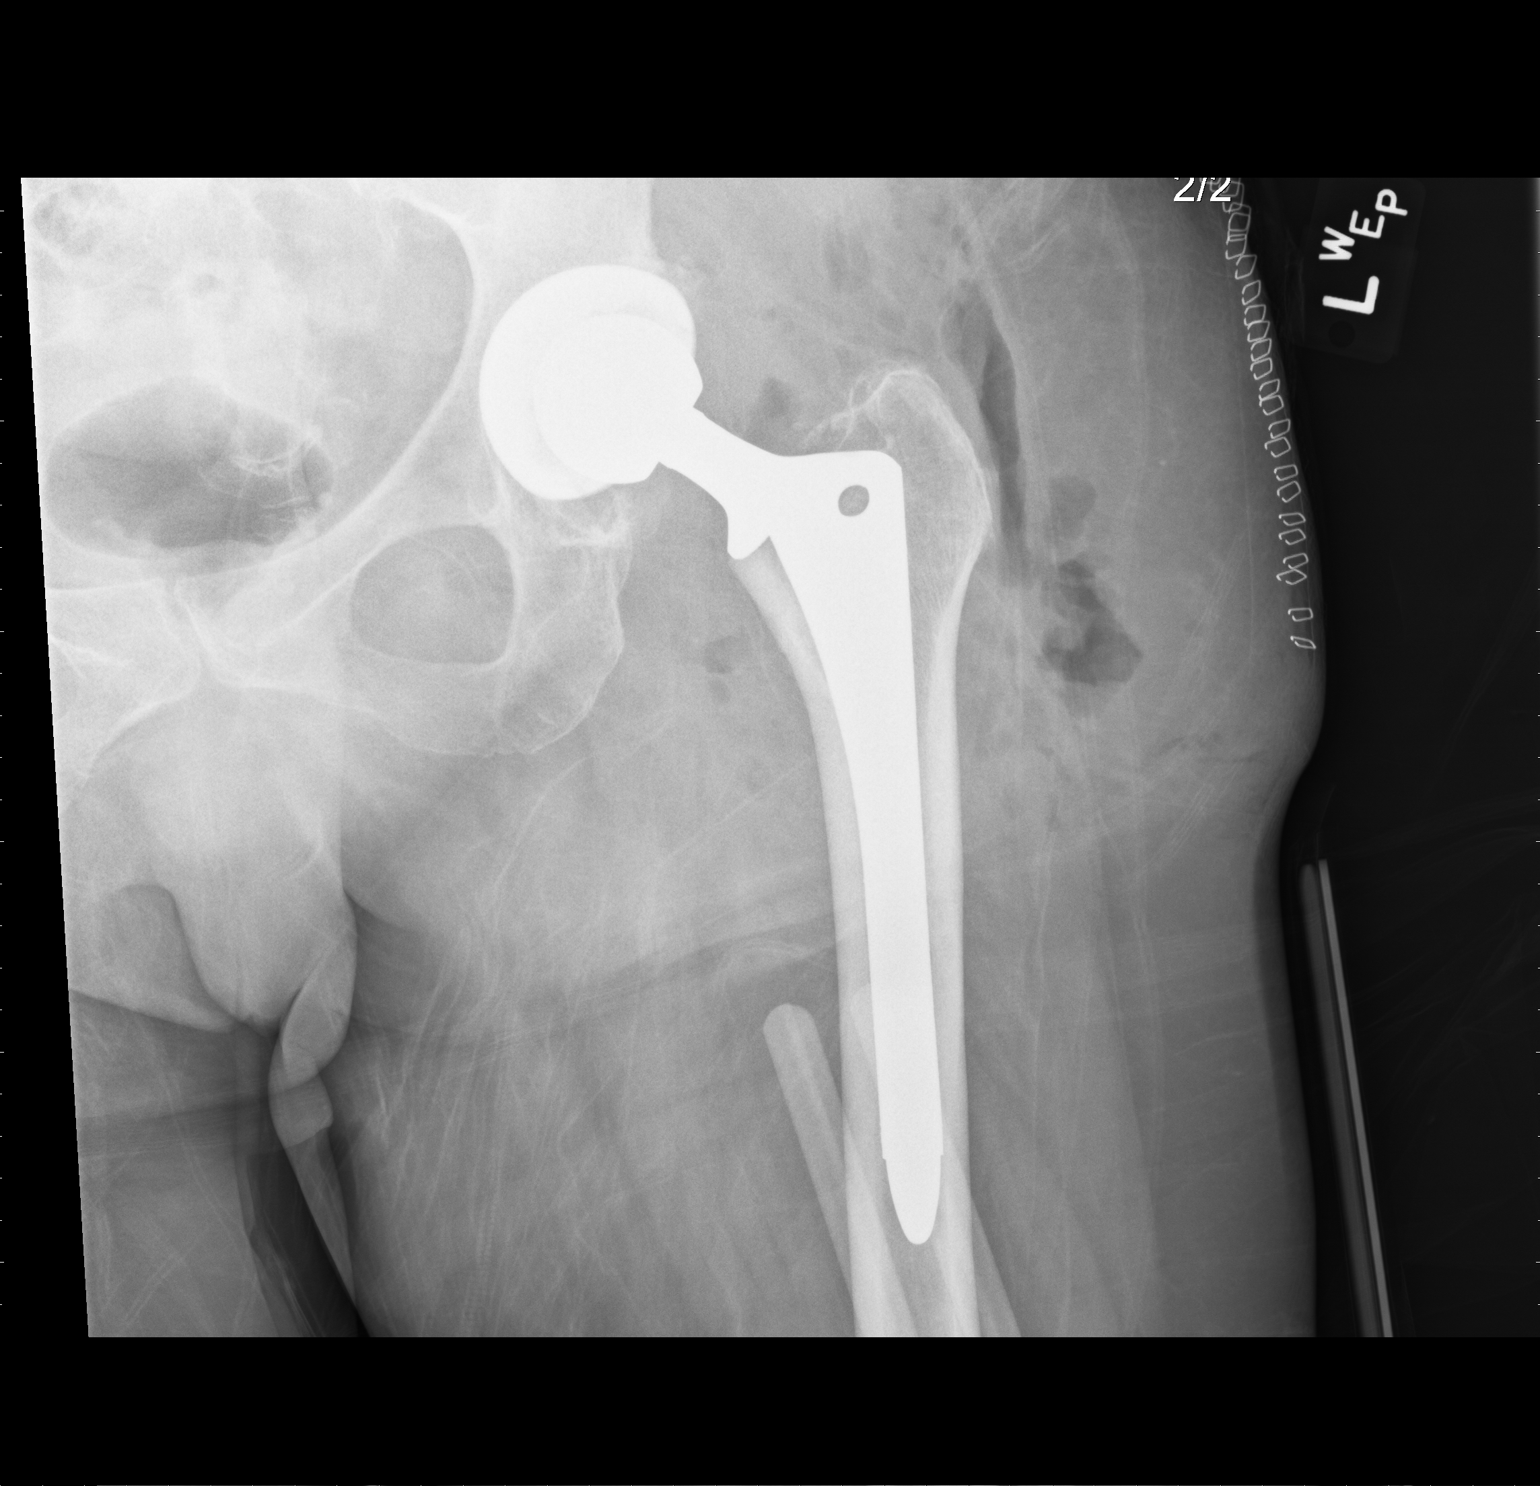

[2 of 2 positions shown; findings below may reference images not displayed]

FINDINGS: Left total hip prosthesis has been inserted and the
acetabular and femoral components appear in excellent position in
the AP projection.

There is moderate osteoarthritis of the right hip and there are
degenerative changes of the lower lumbar spine.
IMPRESSION: Satisfactory appearance of the left hip in the AP projection after
total hip prosthesis insertion.

## 2012-03-15 ENCOUNTER — Ambulatory Visit: Payer: BC Managed Care – PPO | Admitting: Hematology & Oncology

## 2012-03-15 ENCOUNTER — Other Ambulatory Visit (HOSPITAL_BASED_OUTPATIENT_CLINIC_OR_DEPARTMENT_OTHER): Payer: BC Managed Care – PPO | Admitting: Lab

## 2012-03-23 ENCOUNTER — Ambulatory Visit (HOSPITAL_BASED_OUTPATIENT_CLINIC_OR_DEPARTMENT_OTHER): Payer: BC Managed Care – PPO | Admitting: Hematology & Oncology

## 2012-03-23 ENCOUNTER — Other Ambulatory Visit: Payer: BC Managed Care – PPO | Admitting: Lab

## 2012-03-23 VITALS — BP 151/68 | HR 72 | Temp 97.3°F | Ht 64.0 in | Wt 199.0 lb

## 2012-03-23 DIAGNOSIS — M810 Age-related osteoporosis without current pathological fracture: Secondary | ICD-10-CM

## 2012-03-23 DIAGNOSIS — D509 Iron deficiency anemia, unspecified: Secondary | ICD-10-CM

## 2012-03-23 DIAGNOSIS — C50911 Malignant neoplasm of unspecified site of right female breast: Secondary | ICD-10-CM

## 2012-03-23 DIAGNOSIS — C50919 Malignant neoplasm of unspecified site of unspecified female breast: Secondary | ICD-10-CM

## 2012-03-23 LAB — CBC WITH DIFFERENTIAL (CANCER CENTER ONLY)
BASO%: 0.4 % (ref 0.0–2.0)
Eosinophils Absolute: 0.1 10*3/uL (ref 0.0–0.5)
LYMPH#: 1.8 10*3/uL (ref 0.9–3.3)
MONO#: 0.5 10*3/uL (ref 0.1–0.9)
Platelets: 184 10*3/uL (ref 145–400)
RBC: 3.94 10*6/uL (ref 3.70–5.32)
RDW: 13.2 % (ref 11.1–15.7)
WBC: 5.2 10*3/uL (ref 3.9–10.0)

## 2012-03-23 NOTE — Progress Notes (Signed)
This office note has been dictated.

## 2012-03-23 NOTE — Progress Notes (Signed)
DIAGNOSIS:  Locally recurrent ductal carcinoma of the right breast.  CURRENT THERAPY:  Aromasin 25 mg p.o. daily.  INTERIM HISTORY:  Ms. Pillard comes in for followup.  She is doing well. She is still working.  She has had no complaints since we last saw her. Her hip is doing real well.  She had her right hip operated on a year ago.  This really has made a huge difference with her.  She is more active now.  She is not hurting.  She has had no problems with fevers, sweats, or chills.  She is doing well with the Aromasin.  She is not complaining of any kind of arthralgias or myalgias.  She has had no rashes.  There has been no change in bowel or bladder habits.  When we last saw her, her ferritin was 292 with an iron saturation of 19.  PHYSICAL EXAMINATION:  General:  This is a well-developed, well- nourished white female in no obvious distress.  Vital Signs:  Show a temperature of 97.3, pulse 72, respiratory rate 18, blood pressure is 151/68, weight is 199.  Head and Neck Exam:  Shows a normocephalic, atraumatic skull.  There are no ocular or oral lesions.  There are no palpable cervical or supraclavicular lymph nodes.  Lungs:  Clear bilaterally.  Cardiac Exam:  Regular rate and rhythm with a normal S1 and S2.  There are no murmurs, rubs, or bruits.  Chest Wall Exam:  Shows right chest wall with mastectomy.  She has hyperpigmentation from radiation therapy.  There are no obvious masses, erythema, or nodularity noted.  The left breast is without masses, edema, or erythema.  There is no left axillary adenopathy.  Abdominal Exam:  Soft with good bowel sounds.  There is no palpable abdominal mass.  There is no fluid wave. There is no palpable hepatosplenomegaly.  Extremities:  Show no clubbing, cyanosis, or edema.  She has a healed right hip repair scar. Neurological Exam:  No focal neurological deficit.  Back Exam:  No kyphosis.  LABORATORY STUDIES:  White cell count 5.2, hemoglobin  11.5, hematocrit 35.7, platelet count 184.  MCV is 91.  IMPRESSION:  Ms. Klar is a 61 year old white female with locally recurrent infiltrating ductal carcinoma of the right breast.  She underwent resection, followed by radiation therapy.  She is doing well.  I do not see any evidence of recurrence.  She is taking vitamin D.  She is taking 50,000 units weekly.  We will go ahead and plan to get her back in 4 months now.  I do not see a need for any additional studies right now.    ______________________________ Josph Macho, M.D. PRE/MEDQ  D:  03/23/2012  T:  03/23/2012  Job:  [id number]

## 2012-03-24 LAB — VITAMIN D 25 HYDROXY (VIT D DEFICIENCY, FRACTURES): Vit D, 25-Hydroxy: 37 ng/mL (ref 30–89)

## 2012-03-24 LAB — COMPREHENSIVE METABOLIC PANEL
ALT: 12 U/L (ref 0–35)
Albumin: 4.2 g/dL (ref 3.5–5.2)
CO2: 29 mEq/L (ref 19–32)
Calcium: 9.1 mg/dL (ref 8.4–10.5)
Chloride: 108 mEq/L (ref 96–112)
Potassium: 3.9 mEq/L (ref 3.5–5.3)
Sodium: 145 mEq/L (ref 135–145)
Total Protein: 6.7 g/dL (ref 6.0–8.3)

## 2012-03-30 ENCOUNTER — Telehealth: Payer: Self-pay | Admitting: Hematology & Oncology

## 2012-03-30 NOTE — Telephone Encounter (Addendum)
Message copied by Cathi Roan on Tue Mar 30, 2012  2:59 PM ------      Message from: Josph Macho      Created: Sun Mar 28, 2012  8:47 PM       Call - labs are ok. Cindee Lame  03-30-12 @ 3:00 pm, called patient and spoke to her husband to please inform Makenzi, the above message from MD.

## 2012-04-02 ENCOUNTER — Ambulatory Visit (HOSPITAL_COMMUNITY): Payer: BC Managed Care – PPO

## 2012-04-09 ENCOUNTER — Ambulatory Visit (HOSPITAL_COMMUNITY)
Admission: RE | Admit: 2012-04-09 | Discharge: 2012-04-09 | Disposition: A | Discharge status: Home / Self Care | Payer: BC Managed Care – PPO | Source: Ambulatory Visit | Attending: Hematology & Oncology | Admitting: Hematology & Oncology

## 2012-04-09 DIAGNOSIS — Z1231 Encounter for screening mammogram for malignant neoplasm of breast: Secondary | ICD-10-CM

## 2012-05-11 ENCOUNTER — Other Ambulatory Visit: Payer: Self-pay | Admitting: Hematology & Oncology

## 2012-06-24 ENCOUNTER — Encounter (INDEPENDENT_AMBULATORY_CARE_PROVIDER_SITE_OTHER): Payer: Self-pay

## 2012-06-24 ENCOUNTER — Ambulatory Visit (INDEPENDENT_AMBULATORY_CARE_PROVIDER_SITE_OTHER): Payer: BC Managed Care – PPO | Admitting: General Surgery

## 2012-06-24 VITALS — BP 198/80 | HR 84 | Temp 97.4°F | Resp 18 | Ht 64.0 in | Wt 201.0 lb

## 2012-06-24 DIAGNOSIS — Z853 Personal history of malignant neoplasm of breast: Secondary | ICD-10-CM

## 2012-06-24 NOTE — Progress Notes (Signed)
Operation: Right modified radical mastectomy  Date: July 24, 1997   Hormone receptor status: Positive  HPI: [name] is here for long-term followup of a right breast cancer. She had her second right chest wall recurrence  and has had radiation therapy. She is  on a Aromasin and has some hot flashes with this.  She denies any new nodules on the left chest wall or masses in the left breast. No adenopathy. Lymphedema in the right arm is the same.  The right chest wall feels stiff to her.  PE: Right chest wall-transverse scar with radiation changes of the skin; no nodules palpable. Left breast-no palpable masses or suspicious skin changes; lateral scar noted.  Lymph nodes-no axillary or supraclavicular adenopathy.  Assessment:  Right breast cancer with 2 chest wall recurrences. No clinical evidence of any further chest wall recurrences following her radiation therapy. Lymphedema of the right arm is unchanged.  Plan: Exercises were given to her to keep ROM of her Pectoralis major muscle.  Return visit 6 months.

## 2012-06-24 NOTE — Patient Instructions (Signed)
Call if you find any new nodules.

## 2012-07-22 ENCOUNTER — Other Ambulatory Visit (HOSPITAL_BASED_OUTPATIENT_CLINIC_OR_DEPARTMENT_OTHER): Payer: BC Managed Care – PPO | Admitting: Lab

## 2012-07-22 ENCOUNTER — Ambulatory Visit (HOSPITAL_BASED_OUTPATIENT_CLINIC_OR_DEPARTMENT_OTHER)
Admission: RE | Admit: 2012-07-22 | Discharge: 2012-07-22 | Disposition: A | Discharge status: Home / Self Care | Payer: BC Managed Care – PPO | Source: Ambulatory Visit | Attending: Hematology & Oncology | Admitting: Hematology & Oncology

## 2012-07-22 ENCOUNTER — Ambulatory Visit (HOSPITAL_BASED_OUTPATIENT_CLINIC_OR_DEPARTMENT_OTHER): Payer: BC Managed Care – PPO | Admitting: Hematology & Oncology

## 2012-07-22 VITALS — BP 142/61 | HR 72 | Temp 98.3°F | Resp 16 | Ht 64.0 in | Wt 198.0 lb

## 2012-07-22 DIAGNOSIS — C50919 Malignant neoplasm of unspecified site of unspecified female breast: Secondary | ICD-10-CM

## 2012-07-22 DIAGNOSIS — I82819 Embolism and thrombosis of superficial veins of unspecified lower extremities: Secondary | ICD-10-CM

## 2012-07-22 DIAGNOSIS — I82409 Acute embolism and thrombosis of unspecified deep veins of unspecified lower extremity: Secondary | ICD-10-CM

## 2012-07-22 DIAGNOSIS — I82890 Acute embolism and thrombosis of other specified veins: Secondary | ICD-10-CM | POA: Insufficient documentation

## 2012-07-22 DIAGNOSIS — D509 Iron deficiency anemia, unspecified: Secondary | ICD-10-CM

## 2012-07-22 DIAGNOSIS — C50911 Malignant neoplasm of unspecified site of right female breast: Secondary | ICD-10-CM

## 2012-07-22 DIAGNOSIS — M810 Age-related osteoporosis without current pathological fracture: Secondary | ICD-10-CM

## 2012-07-22 LAB — CBC WITH DIFFERENTIAL (CANCER CENTER ONLY)
BASO%: 0.3 % (ref 0.0–2.0)
EOS%: 2.2 % (ref 0.0–7.0)
HCT: 36 % (ref 34.8–46.6)
HGB: 11.7 g/dL (ref 11.6–15.9)
LYMPH#: 1.9 10*3/uL (ref 0.9–3.3)
MCHC: 32.5 g/dL (ref 32.0–36.0)
MONO#: 0.5 10*3/uL (ref 0.1–0.9)
NEUT#: 3.2 10*3/uL (ref 1.5–6.5)
NEUT%: 55.8 % (ref 39.6–80.0)
RDW: 13.2 % (ref 11.1–15.7)
WBC: 5.8 10*3/uL (ref 3.9–10.0)

## 2012-07-22 MED ORDER — RIVAROXABAN 20 MG PO TABS: 20.0000 mg | ORAL_TABLET | Freq: Every day | ORAL | Status: DC

## 2012-07-22 NOTE — Progress Notes (Signed)
This is an addendum to my dictated note for  [employer]. We did do a Doppler of her right leg. She was found to have a superficial thrombus in the saphenous vein. She does have varicose veins.  I am going to do a hyper coagulable workup on her. I do not believe that this is related to her having the breast cancer nor to the Aromasin.  I do believe she warrants full anticoagulation. I will put her on Xarelto for 3 months. I believe this is appropriate for her.  I am checking her renal function.  She has appointment to come back to see me in one month. She may need to see vascular surgery at the end of her period of anticoagulation and see about the possibility of some type of surgical intervention for the varicose veins.   Cindee Lame e

## 2012-07-22 NOTE — Addendum Note (Signed)
Addended by: Arlan Organ R on: 07/22/2012 12:51 PM   Modules accepted: Orders

## 2012-07-22 NOTE — Progress Notes (Signed)
This office note has been dictated.

## 2012-07-23 LAB — IRON AND TIBC
%SAT: 25 % (ref 20–55)
TIBC: 257 ug/dL (ref 250–470)
UIBC: 194 ug/dL (ref 125–400)

## 2012-07-23 LAB — COMPREHENSIVE METABOLIC PANEL
ALT: 12 U/L (ref 0–35)
Albumin: 4.3 g/dL (ref 3.5–5.2)
Alkaline Phosphatase: 62 U/L (ref 39–117)
Glucose, Bld: 85 mg/dL (ref 70–99)
Potassium: 3.7 mEq/L (ref 3.5–5.3)
Sodium: 146 mEq/L — ABNORMAL HIGH (ref 135–145)
Total Bilirubin: 0.4 mg/dL (ref 0.3–1.2)
Total Protein: 7.2 g/dL (ref 6.0–8.3)

## 2012-07-23 LAB — VITAMIN D 25 HYDROXY (VIT D DEFICIENCY, FRACTURES): Vit D, 25-Hydroxy: 33 ng/mL (ref 30–89)

## 2012-07-23 LAB — HYPERCOAGULABLE PANEL, COMPREHENSIVE
AntiThromb III Func: 103 % (ref 76–126)
Anticardiolipin IgM: 3 MPL U/mL (ref <11)
Beta-2 Glyco I IgG: 0 G Units (ref <20)
Beta-2-Glycoprotein I IgM: 3 M Units (ref <20)
DRVVT: 39.5 secs (ref <42.9)
PTT Lupus Anticoagulant: 35 secs (ref 28.0–43.0)
Protein C Activity: 172 % — ABNORMAL HIGH (ref 75–133)
Protein S Activity: 132 % — ABNORMAL HIGH (ref 69–129)

## 2012-07-23 NOTE — Progress Notes (Signed)
CC:   [name] Rakes, MD  DIAGNOSIS:  Locally recurrent ductal carcinoma of the right breast.  CURRENT THERAPY:  Aromasin 25 mg p.o. daily.  INTERIM HISTORY:  [name] comes in for her followup.  We last saw her back in June.  The problem she is now having is that there is pain in the upper inner right thigh.  She does have some varicose veins.  She says that it is quite tender up there.  She has noted a firmness on the inner thigh.  She is still working.  She stands quite a lot at work.  She has not noticed any swelling in the right leg.  She has had no cough.  She has had no nausea or vomiting.  There has been no change in bowel or bladder habits.  When we last saw her in June, her vitamin D level was 37.  PHYSICAL EXAMINATION:  General:  This is a well-developed and well- nourished white female in no obvious distress.  Vital signs: Temperature of 98.3, pulse 72, respiratory rate 16, blood pressure 142/61.  Weight is 198.  Head and neck:  Normocephalic, atraumatic skull.  There are no ocular or oral lesions.  There are no palpable cervical or supraclavicular lymph nodes.  Lungs:  Clear to percussion and auscultation bilaterally.  Cardiac:  Regular rate and rhythm with a normal S1 and S2.  There are no murmurs or rubs, or bruits.  Abdomen: Soft with good bowel sounds.  There is no palpable abdominal mass. There is no palpable hepatosplenomegaly.  Extremities do show varicose vein in the right upper inner thigh.  She does have a venous cord that is palpable in the upper inner thigh on the right side.  There is no erythema associated with this.  It is slightly tender.  There is no swelling in the lower legs.  She does have a chronic lymphedema of the right arm.  LABORATORY STUDIES:  White cell count is 5.8, hemoglobin 11.7, hematocrit 36, platelet count 217.  IMPRESSION:  [name] is a 61 year old white female with locally recurrent infiltrating ductal carcinoma of the  right breast.  She underwent resection followed by local radiation therapy.  She is on Aromasin.  She is also on vitamin D, taking this weekly.  We are going to have to get a Doppler of her right leg.  I think that she may have a superficial venous thrombosis.  If so, I still would be inclined to treat this with therapeutic anticoagulation.  She does have a thrombosed varicose vein.  This may be a problem that can only be dealt with surgically.  We will get the Doppler of her leg today.  I want see her back in 1 month for followup.  Again, if we do find that she has a thrombosis in a superficial vein, I would treat her therapeutically.    ______________________________ Josph Macho, M.D. PRE/MEDQ  D:  07/22/2012  T:  07/23/2012  Job:  [id number]

## 2012-07-27 ENCOUNTER — Other Ambulatory Visit: Payer: Self-pay | Admitting: Hematology & Oncology

## 2012-08-19 ENCOUNTER — Ambulatory Visit (HOSPITAL_BASED_OUTPATIENT_CLINIC_OR_DEPARTMENT_OTHER): Payer: BC Managed Care – PPO | Admitting: Hematology & Oncology

## 2012-08-19 VITALS — BP 141/58 | HR 87 | Temp 98.7°F | Resp 16 | Ht 64.0 in | Wt 198.0 lb

## 2012-08-19 DIAGNOSIS — Z853 Personal history of malignant neoplasm of breast: Secondary | ICD-10-CM

## 2012-08-19 DIAGNOSIS — I82890 Acute embolism and thrombosis of other specified veins: Secondary | ICD-10-CM

## 2012-08-19 DIAGNOSIS — C44599 Other specified malignant neoplasm of skin of other part of trunk: Secondary | ICD-10-CM

## 2012-08-19 DIAGNOSIS — I82819 Embolism and thrombosis of superficial veins of unspecified lower extremities: Secondary | ICD-10-CM

## 2012-08-19 NOTE — Progress Notes (Signed)
This office note has been dictated.

## 2012-08-20 NOTE — Progress Notes (Signed)
CC:   [name] Rakes, MD  DIAGNOSES: 1. Superficial vein thrombosis of the right upper inner thigh. 2. Locally recurrent ductal carcinoma of the right breast.  CURRENT THERAPY: 1. Xarelto 20 mg p.o. daily. 2. Aromasin 25 mg p.o. daily.  INTERIM HISTORY:  [name] comes in for followup.  When we last saw her, shockingly enough, she did have a superficial vein that was thrombosed.  This was in the great saphenous vein.  I thought she did need anticoagulation.  As such, I put her on Xarelto. We did do a hypercoagulable study on her.  All hypercoagulable tests were negative.  She says that the pain in the right upper inner thigh is better.  She says it is not as swollen or red.  Unfortunately, she has to stand all day at work.  This really is really an issue.  There is not much that she can do, however.  Hopefully she may try to retire before a year.  Again, I want her on 1 year of Xarelto.  She is not hurting otherwise.  She is doing well on the Aromasin.  She does not have arthralgias outside of that associated with her work.  PHYSICAL EXAMINATION:  General:  This is a well-developed, well- nourished white female, in no obvious distress.  Vital signs:  98.7, pulse 87, respiratory rate 16, blood pressure 141/58.  Weight is 198. Head and neck:  Normocephalic, atraumatic skull.  There are no ocular or oral lesions.  There are no palpable cervical or supraclavicular lymph nodes.  Lungs:  Clear bilaterally.  Cardiac:  Regular rate and rhythm, with a normal S1, S2.  There are no murmurs, rubs, or bruits.  Breast: Left breast with no masses, edema, or erythema.  There is no left axillary adenopathy.  Her right chest wall shows a well-healed mastectomy.  She has some radiation changes.  There is no right axillary adenopathy.  Abdomen:  Soft, with good bowel sounds.  There is no palpable hepatosplenomegaly.  Back:  Back exam shows no palpable tenderness over the spine, ribs, or  hips.  Extremities:  The extremities do show a lot of varicose veins in the lower legs.  She does have a large varicose vein in the right inner thigh.  There is a palpable venous cord in the superficial saphenous vein on the right inner thigh. There is no erythema associated with this.  LABORATORY DATA:  Laboratory studies were not done at this visit.  IMPRESSION:  [name] is a 61 year old white female with locally recurrent ductal carcinoma of the right breast.  She also has a superficial vein thrombus in the right greater saphenous vein.  We will get her back in a month or so.  I do want to repeat a Doppler of the right thigh so we can see how she is responding to the Xarelto.  It is possible that she may actually need to have some type of thrombectomy done.  She has a lot of varicose veins.  Hopefully the tenderness will improve.  I do not see any problems with respect to her breast cancer.  We will get [name] back in in another month or so.    ______________________________ Josph Macho, M.D. PRE/MEDQ  D:  08/19/2012  T:  08/20/2012  Job:  [id number]

## 2012-10-01 ENCOUNTER — Other Ambulatory Visit (HOSPITAL_BASED_OUTPATIENT_CLINIC_OR_DEPARTMENT_OTHER): Payer: BC Managed Care – PPO | Admitting: Lab

## 2012-10-01 ENCOUNTER — Ambulatory Visit (HOSPITAL_BASED_OUTPATIENT_CLINIC_OR_DEPARTMENT_OTHER): Payer: BC Managed Care – PPO | Admitting: Medical

## 2012-10-01 ENCOUNTER — Ambulatory Visit (HOSPITAL_BASED_OUTPATIENT_CLINIC_OR_DEPARTMENT_OTHER)
Admission: RE | Admit: 2012-10-01 | Discharge: 2012-10-01 | Disposition: A | Discharge status: Home / Self Care | Payer: BC Managed Care – PPO | Source: Ambulatory Visit | Attending: Hematology & Oncology | Admitting: Hematology & Oncology

## 2012-10-01 VITALS — BP 132/55 | HR 70 | Temp 97.8°F | Resp 16 | Ht 64.0 in | Wt 197.0 lb

## 2012-10-01 DIAGNOSIS — I82890 Acute embolism and thrombosis of other specified veins: Secondary | ICD-10-CM | POA: Insufficient documentation

## 2012-10-01 DIAGNOSIS — C50911 Malignant neoplasm of unspecified site of right female breast: Secondary | ICD-10-CM

## 2012-10-01 DIAGNOSIS — C50919 Malignant neoplasm of unspecified site of unspecified female breast: Secondary | ICD-10-CM

## 2012-10-01 DIAGNOSIS — Z86718 Personal history of other venous thrombosis and embolism: Secondary | ICD-10-CM

## 2012-10-01 LAB — CBC WITH DIFFERENTIAL (CANCER CENTER ONLY)
BASO%: 0.4 % (ref 0.0–2.0)
LYMPH%: 32.9 % (ref 14.0–48.0)
MCH: 28.4 pg (ref 26.0–34.0)
MCV: 90 fL (ref 81–101)
MONO#: 0.3 10*3/uL (ref 0.1–0.9)
MONO%: 6.1 % (ref 0.0–13.0)
NEUT#: 3.3 10*3/uL (ref 1.5–6.5)
Platelets: 229 10*3/uL (ref 145–400)
RBC: 3.59 10*6/uL — ABNORMAL LOW (ref 3.70–5.32)
RDW: 13.4 % (ref 11.1–15.7)
WBC: 5.5 10*3/uL (ref 3.9–10.0)

## 2012-10-01 NOTE — Progress Notes (Signed)
Diagnoses: #1 superficial vein thrombosis of the right upper and her. #2 locally recurrent adenocarcinoma of the right breast.  Current therapy: #1.Xarelto 20 mg by mouth daily. #2 Aromasin 25 mg by mouth daily  Interim history: [name] presents today for an office followup visit.  Overall, she, reports, that she is doing quite well.  She was diagnosed with a superficial vein thrombosis of the right upper, inner thigh I believe back in October.  She did have a repeat Doppler of her right lower extremity today.  This revealed no evidence of lower extremity, deep vein thrombosis.  Right distal thigh G S V , superficial, venous thrombosis.  She does remain on Xarelto until the end of January.  She does not report any pain in the right leg.  She does have some very minimal swelling around the right ankle.  Unfortunately, she does stand all day at work.  In terms of her locally recurrent adenocarcinoma of the right breast, she remains on Aromasin 25 mg by mouth daily.  She tolerates that well without any major toxicities.  She has a good appetite.  She denies any nausea, vomiting, diarrhea, constipation, chest pain, shortness of breath, or cough.  She denies any fevers, chills, or night sweats.  She denies any headaches, visual changes, or rashes.  She denies any obvious, or abnormal bleeding.  She denies any abdominal pain.  Overall, she is getting along quite well.  Review of Systems: Constitutional:Negative for malaise/fatigue, fever, chills, weight loss, diaphoresis, activity change, appetite change, and unexpected weight change.  HEENT: Negative for double vision, blurred vision, visual loss, ear pain, tinnitus, congestion, rhinorrhea, epistaxis sore throat or sinus disease, oral pain/lesion, tongue soreness Respiratory: Negative for cough, chest tightness, shortness of breath, wheezing and stridor.  Cardiovascular: Negative for chest pain, palpitations, leg swelling, orthopnea, PND, DOE or  claudication Gastrointestinal: Negative for nausea, vomiting, abdominal pain, diarrhea, constipation, blood in stool, melena, hematochezia, abdominal distention, anal bleeding, rectal pain, anorexia and hematemesis.  Genitourinary: Negative for dysuria, frequency, hematuria,  Musculoskeletal: Negative for myalgias, back pain, joint swelling, arthralgias and gait problem.  Skin: Negative for rash, color change, pallor and wound.  Neurological:. Negative for dizziness/light-headedness, tremors, seizures, syncope, facial asymmetry, speech difficulty, weakness, numbness, headaches and paresthesias.  Hematological: Negative for adenopathy. Does not bruise/bleed easily.  Psychiatric/Behavioral:  Negative for depression, no loss of interest in normal activity or change in sleep pattern.   Physical Exam: This is a 61 year old, well-developed, well-nourished, white female, in no obvious distress Vitals: Temperature 97.8 degrees, pulse 70, respirations 16, blood pressure 132/55.  Weight 197 pounds HEENT reveals a normocephalic, atraumatic skull, no scleral icterus, no oral lesions  Neck is supple without any cervical or supraclavicular adenopathy.  Lungs are clear to auscultation bilaterally. There are no wheezes, rales or rhonci Cardiac is regular rate and rhythm with a normal S1 and S2. There are no murmurs, rubs, or bruits.  Abdomen is soft with good bowel sounds, there is no palpable mass. There is no palpable hepatosplenomegaly. There is no palpable fluid wave.  Musculoskeletal no tenderness of the spine, ribs, or hips.  Extremities there are no clubbing, cyanosis, or edema.  Skin no petechia, purpura or ecchymosis Neurologic is nonfocal.  Laboratory Data: White count 5.5, hemoglobin 12.2, hematocrit 32.3, platelets 229,000  Current Outpatient Prescriptions on File Prior to Visit  Medication Sig Dispense Refill  . amoxicillin (AMOXIL) 500 MG capsule       . AZOR 5-20 MG per tablet Take  1 tablet  by mouth daily.       Marland Kitchen exemestane (AROMASIN) 25 MG tablet TAKE 1 TABLET BY MOUTH EVERY DAY  30 tablet  11  . hydrochlorothiazide 25 MG tablet Take 25 mg by mouth daily.       Marland Kitchen HYDROcodone-acetaminophen (NORCO/VICODIN) 5-325 MG per tablet 1 tablet every 8 (eight) hours as needed.       . Rivaroxaban (XARELTO) 20 MG TABS Take 1 tablet (20 mg total) by mouth daily.  30 tablet  2  . simvastatin (ZOCOR) 10 MG tablet Take 10 mg by mouth at bedtime.       . Vitamin D, Ergocalciferol, (DRISDOL) 50000 UNITS CAPS TAKE ONE CAPSULE BY MOUTH EVERY WEEK  16 capsule  3   Assessment/Plan: This is a pleasant, 61 year old, white female, with the following issues:  #1 locally recurrent adenocarcinoma of the right breast.  She underwent resection, followed by radiation therapy.  She is on Aromasin 25 mg by mouth daily.  #2.  Superficial vein thrombosis of the right greater saphenous vein. She remains on Xarelto without any problem.  She will remain on Xarelto for a total of 3 months.  He should take her through the end of January.  #3.  Follow.  She will follow back up with [place] in another couple months, but before then should there be questions or concerns.

## 2012-12-03 ENCOUNTER — Ambulatory Visit (HOSPITAL_BASED_OUTPATIENT_CLINIC_OR_DEPARTMENT_OTHER): Payer: BC Managed Care – PPO | Admitting: Hematology & Oncology

## 2012-12-03 ENCOUNTER — Other Ambulatory Visit (HOSPITAL_BASED_OUTPATIENT_CLINIC_OR_DEPARTMENT_OTHER): Payer: BC Managed Care – PPO | Admitting: Lab

## 2012-12-03 VITALS — BP 153/64 | HR 79 | Temp 97.9°F | Resp 16 | Ht 64.0 in | Wt 205.0 lb

## 2012-12-03 LAB — CBC WITH DIFFERENTIAL (CANCER CENTER ONLY)
BASO%: 0.7 % (ref 0.0–2.0)
LYMPH#: 1.9 10*3/uL (ref 0.9–3.3)
MONO#: 0.5 10*3/uL (ref 0.1–0.9)
NEUT#: 2.9 10*3/uL (ref 1.5–6.5)
Platelets: 218 10*3/uL (ref 145–400)
RBC: 4.05 10*6/uL (ref 3.70–5.32)
RDW: 14.4 % (ref 11.1–15.7)
WBC: 5.6 10*3/uL (ref 3.9–10.0)

## 2012-12-03 MED ORDER — TRAMADOL HCL 50 MG PO TABS: 50.0000 mg | ORAL_TABLET | Freq: Four times a day (QID) | ORAL | Status: DC | PRN

## 2012-12-03 NOTE — Progress Notes (Signed)
CC:   Charlott Rakes, MD  DIAGNOSIS: 1. Locally recurrent adenocarcinoma of the right breast. 2. Superficial vein thrombosis of the right upper thigh.  CURRENT THERAPY: 1. Aromasin 25 mg p.o. daily. 2. The patient has completed 3 months of Xarelto.  INTERIM HISTORY:  Ms. Henri comes in for followup.  She is doing okay. She has no specific complaints about her health.  She still has some pain in the right upper inner thigh.  This is where she had the superficial thrombosis.  She completed 3 months of Xarelto in January.  Unfortunately, her husband got sick in December.  He is finally starting to get better.  He was hospitalized for about 3 weeks.  She is still working.  She says when she stands with work, there is a little bit more pain in the right upper thigh.  I told her that 2 baby aspirin a day would be a good idea for her and I think this will help with this superficial vein thrombus and some of the phlebitis that she may have from this.  She does have some varicose veins up in the right inner thigh which probably are causing some of her discomfort.  She does have a lot of tightness in the right anterior chest where she had her mastectomy and radiation.  She thinks this affects her breathing a little bit.  She has had no bleeding.  She has had no nausea or vomiting.  She has had no fever, sweats, or chills.  She does have some anemia.  Her iron studies when we checked back in October were pretty good.  PHYSICAL EXAMINATION:  General:  This is a well-developed, well- nourished white female in no obvious distress.  Vital signs: Temperature of 97.9, pulse 79, respiratory rate 16, blood pressure 153/64.  Weight is 205.  Head and neck:  Normocephalic, atraumatic skull.  There are no ocular or oral lesions.  There are no palpable cervical or supraclavicular lymph nodes.  Lungs:  Clear bilaterally. Cardiac:  Regular rate and rhythm with a normal S1 and S2.  There are  no murmurs, rubs, or bruits.  Abdomen:  Soft with good bowel sounds.  There is no palpable abdominal mass.  There is no palpable hepatosplenomegaly. Breasts:  Left breast with no masses, edema, or erythema.  There is no left axillary adenopathy.  Right chest wall shows mastectomy.  She has radiation changes.  She has radiation telangiectasias on the right anterior chest wall.  There is no right axillary adenopathy. Extremities:  No clubbing, cyanosis, or edema.  She does have the varicose veins in the legs.  No swelling is noted in the inner thigh. No palpable venous cord is noted in the inner thigh.  Neurologic:  No focal neurological deficits.  LABORATORY STUDIES:  White cell count is 5.6, hemoglobin 11.1, hematocrit 35, platelet count 218.  MCV is 86.  IMPRESSION:  Ms. Ivins is a very nice 62 year old white female with locally recurrent adenocarcinoma of the right breast.  She is doing okay.  She is on Aromasin.  She completed her radiation therapy I think back in November 2012.  Again, I told her to take 2 baby aspirin a day.  I think this will help with this inner thigh pain.  We will plan to get her back in another 3 months so.    ______________________________ Josph Macho, M.D. PRE/MEDQ  D:  12/03/2012  T:  12/03/2012  Job:  [id number]

## 2012-12-03 NOTE — Progress Notes (Signed)
This office note has been dictated.

## 2012-12-04 LAB — COMPREHENSIVE METABOLIC PANEL
ALT: 12 U/L (ref 0–35)
Albumin: 4.1 g/dL (ref 3.5–5.2)
CO2: 27 mEq/L (ref 19–32)
Calcium: 9.4 mg/dL (ref 8.4–10.5)
Chloride: 106 mEq/L (ref 96–112)
Potassium: 3.7 mEq/L (ref 3.5–5.3)
Sodium: 141 mEq/L (ref 135–145)
Total Bilirubin: 0.3 mg/dL (ref 0.3–1.2)
Total Protein: 6.7 g/dL (ref 6.0–8.3)

## 2012-12-04 LAB — VITAMIN D 25 HYDROXY (VIT D DEFICIENCY, FRACTURES): Vit D, 25-Hydroxy: 29 ng/mL — ABNORMAL LOW (ref 30–89)

## 2012-12-10 ENCOUNTER — Encounter (INDEPENDENT_AMBULATORY_CARE_PROVIDER_SITE_OTHER): Payer: Self-pay | Admitting: General Surgery

## 2012-12-10 ENCOUNTER — Ambulatory Visit (INDEPENDENT_AMBULATORY_CARE_PROVIDER_SITE_OTHER): Payer: BC Managed Care – PPO | Admitting: General Surgery

## 2012-12-10 VITALS — BP 142/76 | HR 74 | Temp 97.8°F | Resp 18 | Ht 63.0 in | Wt 201.5 lb

## 2012-12-10 NOTE — Patient Instructions (Signed)
Call if you find any new chest wall nodules. 

## 2012-12-10 NOTE — Progress Notes (Signed)
Operation: Right modified radical mastectomy  Date: July 24, 1997   Hormone receptor status: Positive  HPI: [name] is here for long-term followup of a right breast cancer. She had her second right chest wall recurrence had excision and radiation therapy. She is  on a Aromasin and has some hot flashes with this.  She denies any new nodules on the right chest wall or masses in the left breast. No adenopathy. Lymphedema in the right arm is the same.  The right chest wall feels stiff to her.  PE: Right chest wall-transverse scar with radiation changes of the skin; no nodules palpable. Left breast-no palpable masses or suspicious skin changes; lateral scar noted.  Lymph nodes-no axillary or supraclavicular adenopathy.  Assessment:  Right breast cancer with 2 chest wall recurrences. No clinical evidence of any further chest wall recurrences following her radiation therapy. Lymphedema of the right arm is unchanged.  Plan:   Return visit 6 months.

## 2013-03-02 ENCOUNTER — Ambulatory Visit (HOSPITAL_BASED_OUTPATIENT_CLINIC_OR_DEPARTMENT_OTHER): Payer: BC Managed Care – PPO | Admitting: Medical

## 2013-03-02 ENCOUNTER — Other Ambulatory Visit (HOSPITAL_BASED_OUTPATIENT_CLINIC_OR_DEPARTMENT_OTHER): Payer: BC Managed Care – PPO | Admitting: Lab

## 2013-03-02 VITALS — BP 132/51 | HR 75 | Temp 98.0°F | Resp 16 | Ht 63.0 in | Wt 203.0 lb

## 2013-03-02 DIAGNOSIS — C50911 Malignant neoplasm of unspecified site of right female breast: Secondary | ICD-10-CM

## 2013-03-02 DIAGNOSIS — I82819 Embolism and thrombosis of superficial veins of unspecified lower extremities: Secondary | ICD-10-CM

## 2013-03-02 DIAGNOSIS — I82409 Acute embolism and thrombosis of unspecified deep veins of unspecified lower extremity: Secondary | ICD-10-CM

## 2013-03-02 DIAGNOSIS — C50919 Malignant neoplasm of unspecified site of unspecified female breast: Secondary | ICD-10-CM

## 2013-03-02 LAB — BASIC METABOLIC PANEL - CANCER CENTER ONLY
CO2: 29 mEq/L (ref 18–33)
Calcium: 9.3 mg/dL (ref 8.0–10.3)
Creat: 0.6 mg/dl (ref 0.6–1.2)

## 2013-03-02 NOTE — Progress Notes (Signed)
Diagnoses: #1 superficial vein thrombosis of the right upper and her. #2 locally recurrent adenocarcinoma of the right breast.  Current therapy: #1.Xarelto 20 mg by mouth daily. #2 Aromasin 25 mg by mouth daily  Interim history: [name] presents today for an office followup visit.  Overall, she, reports, that she is doing quite well.  She was diagnosed with a superficial vein thrombosis of the right upper, inner thigh I believe back in October.  She did have a repeat Doppler of her right lower extremity on 10/01/12.  This revealed no evidence of lower extremity, deep vein thrombosis.  Right distal thigh G S V , superficial, venous thrombosis.  She was on Xarelto for a total of three months.. she will have some intermittent pain in the right.  She remains on 2 baby aspirin.  She does have some very minimal swelling around the right ankle.  Unfortunately, she does stand all day at work.  In terms of her locally recurrent adenocarcinoma of the right breast, she remains on Aromasin 25 mg by mouth daily.  She tolerates that well without any major toxicities.  She's not reporting any hot flashes.  Of note, she does have some lymphedema in the right arm which is the same. She has a good appetite.  She denies any nausea, vomiting, diarrhea, constipation, chest pain, shortness of breath, or cough.  She denies any fevers, chills, or night sweats.  She denies any headaches, visual changes, or rashes.  She denies any obvious, or abnormal bleeding.  She denies any abdominal pain.  Overall, she is getting along quite well.  Of note, her next left unilateral mammogram is due in June.  Review of Systems: Constitutional:Negative for malaise/fatigue, fever, chills, weight loss, diaphoresis, activity change, appetite change, and unexpected weight change.  HEENT: Negative for double vision, blurred vision, visual loss, ear pain, tinnitus, congestion, rhinorrhea, epistaxis sore throat or sinus disease, oral pain/lesion,  tongue soreness Respiratory: Negative for cough, chest tightness, shortness of breath, wheezing and stridor.  Cardiovascular: Negative for chest pain, palpitations, leg swelling, orthopnea, PND, DOE or claudication Gastrointestinal: Negative for nausea, vomiting, abdominal pain, diarrhea, constipation, blood in stool, melena, hematochezia, abdominal distention, anal bleeding, rectal pain, anorexia and hematemesis.  Genitourinary: Negative for dysuria, frequency, hematuria,  Musculoskeletal: Negative for myalgias, back pain, joint swelling, arthralgias and gait problem.  Skin: Negative for rash, color change, pallor and wound.  Neurological:. Negative for dizziness/light-headedness, tremors, seizures, syncope, facial asymmetry, speech difficulty, weakness, numbness, headaches and paresthesias.  Hematological: Negative for adenopathy. Does not bruise/bleed easily.  Psychiatric/Behavioral:  Negative for depression, no loss of interest in normal activity or change in sleep pattern.   Physical Exam: [name], well-developed, well-nourished, white female, in no obvious distress Vitals: Temperature 98.6 degrees pulse 75 respirations 16 blood pressure 132/51 weight 203 pounds HEENT reveals a normocephalic, atraumatic skull, no scleral icterus, no oral lesions  Neck is supple without any cervical or supraclavicular adenopathy.  Lungs are clear to auscultation bilaterally. There are no wheezes, rales or rhonci Cardiac is regular rate and rhythm with a normal S1 and S2. There are no murmurs, rubs, or bruits.  Abdomen is soft with good bowel sounds, there is no palpable mass. There is no palpable hepatosplenomegaly. There is no palpable fluid wave.  Musculoskeletal no tenderness of the spine, ribs, or hips.  Extremities there are no clubbing, cyanosis, or edema.  Skin no petechia, purpura or ecchymosis Neurologic is nonfocal. Breasts: Left breast is with no masses,  edema, or erythema.  There  is no left axillary adenopathy.  Right chest wall shows mastectomy.  She does have some radiation changes.  She has radiation telangiectasias on the right anterior chest wall.  There is no right axillary adenopathy.  Laboratory Data: Sodium 143 potassium 4.2 BUN 14 creatinine 0.6 calcium 9.3 glucose 140.  Current Outpatient Prescriptions on File Prior to Visit  Medication Sig Dispense Refill  . AZOR 5-20 MG per tablet Take 1 tablet by mouth daily.       Marland Kitchen exemestane (AROMASIN) 25 MG tablet TAKE 1 TABLET BY MOUTH EVERY DAY  30 tablet  11  . hydrochlorothiazide 25 MG tablet Take 25 mg by mouth daily.       Marland Kitchen HYDROcodone-acetaminophen (NORCO/VICODIN) 5-325 MG per tablet 1 tablet as needed.       . simvastatin (ZOCOR) 10 MG tablet daily.      . traMADol (ULTRAM) 50 MG tablet Take 1 tablet (50 mg total) by mouth every 6 (six) hours as needed for pain.  90 tablet  4  . Vitamin D, Ergocalciferol, (DRISDOL) 50000 UNITS CAPS TAKE ONE CAPSULE BY MOUTH EVERY WEEK  16 capsule  3   No current facility-administered medications on file prior to visit.   Assessment/Plan: This is a pleasant, 62 year old, white female, with the following issues:  #1 locally recurrent adenocarcinoma of the right breast.  She underwent resection, followed by radiation therapy.  She is on Aromasin 25 mg by mouth daily.  She is tolerating this well without any complications.  #2.  Superficial vein thrombosis of the right greater saphenous vein.   She completed a total of three months of Xarelto. She is on two baby aspirin.  #3.  Follow.  She will follow back up with [place] in another couple months, but before then should there be questions or concerns.

## 2013-03-11 ENCOUNTER — Other Ambulatory Visit (INDEPENDENT_AMBULATORY_CARE_PROVIDER_SITE_OTHER): Payer: Self-pay | Admitting: General Surgery

## 2013-03-11 DIAGNOSIS — Z1231 Encounter for screening mammogram for malignant neoplasm of breast: Secondary | ICD-10-CM

## 2013-04-12 ENCOUNTER — Ambulatory Visit (HOSPITAL_COMMUNITY): Payer: BC Managed Care – PPO

## 2013-04-22 ENCOUNTER — Other Ambulatory Visit (INDEPENDENT_AMBULATORY_CARE_PROVIDER_SITE_OTHER): Payer: Self-pay | Admitting: General Surgery

## 2013-04-22 ENCOUNTER — Ambulatory Visit (HOSPITAL_COMMUNITY)
Admission: RE | Admit: 2013-04-22 | Discharge: 2013-04-22 | Disposition: A | Discharge status: Home / Self Care | Payer: BC Managed Care – PPO | Source: Ambulatory Visit | Attending: General Surgery | Admitting: General Surgery

## 2013-04-22 DIAGNOSIS — Z1231 Encounter for screening mammogram for malignant neoplasm of breast: Secondary | ICD-10-CM

## 2013-06-02 ENCOUNTER — Ambulatory Visit (HOSPITAL_BASED_OUTPATIENT_CLINIC_OR_DEPARTMENT_OTHER): Payer: BC Managed Care – PPO | Admitting: Hematology & Oncology

## 2013-06-02 ENCOUNTER — Other Ambulatory Visit (HOSPITAL_BASED_OUTPATIENT_CLINIC_OR_DEPARTMENT_OTHER): Payer: BC Managed Care – PPO | Admitting: Lab

## 2013-06-02 VITALS — BP 149/54 | HR 69 | Temp 98.3°F | Resp 16 | Ht 63.0 in | Wt 202.0 lb

## 2013-06-02 DIAGNOSIS — C50919 Malignant neoplasm of unspecified site of unspecified female breast: Secondary | ICD-10-CM

## 2013-06-02 DIAGNOSIS — Z86718 Personal history of other venous thrombosis and embolism: Secondary | ICD-10-CM

## 2013-06-02 DIAGNOSIS — Z7982 Long term (current) use of aspirin: Secondary | ICD-10-CM

## 2013-06-02 DIAGNOSIS — C50911 Malignant neoplasm of unspecified site of right female breast: Secondary | ICD-10-CM

## 2013-06-02 LAB — CBC WITH DIFFERENTIAL (CANCER CENTER ONLY)
BASO#: 0 10*3/uL (ref 0.0–0.2)
EOS%: 3.4 % (ref 0.0–7.0)
Eosinophils Absolute: 0.2 10*3/uL (ref 0.0–0.5)
HGB: 10.9 g/dL — ABNORMAL LOW (ref 11.6–15.9)
LYMPH#: 1.7 10*3/uL (ref 0.9–3.3)
MCHC: 32 g/dL (ref 32.0–36.0)
NEUT#: 3 10*3/uL (ref 1.5–6.5)
Platelets: 201 10*3/uL (ref 145–400)
RBC: 3.79 10*6/uL (ref 3.70–5.32)

## 2013-06-02 NOTE — Progress Notes (Signed)
This office note has been dictated.

## 2013-06-03 LAB — COMPREHENSIVE METABOLIC PANEL
AST: 13 U/L (ref 0–37)
Albumin: 4.1 g/dL (ref 3.5–5.2)
Alkaline Phosphatase: 64 U/L (ref 39–117)
BUN: 15 mg/dL (ref 6–23)
Calcium: 9.1 mg/dL (ref 8.4–10.5)
Chloride: 108 mEq/L (ref 96–112)
Potassium: 3.7 mEq/L (ref 3.5–5.3)
Sodium: 144 mEq/L (ref 135–145)
Total Protein: 6.6 g/dL (ref 6.0–8.3)

## 2013-06-03 NOTE — Progress Notes (Signed)
CC:   [name] Rakes, MD  DIAGNOSES: 1. Locally recurrent adenocarcinoma of the right breast. 2. History of superficial venous thrombosis of the right thigh.  CURRENT THERAPY: 1. Aromasin 25 mg p.o. daily. 2. Aspirin 162 mg p.o. daily.  INTERIM HISTORY:  [name] comes in for followup.  She is doing okay. She has had a tough summer.  Her mother passed away a couple weeks ago. She has had a brother-in-law and a sister-in-law pass away.  She is working.  She works quite a bit.  This has been tough on her.  She is developing some varicose veins in her lower legs.  She wants to see a vein specialist.  I certainly have no problems with this.  She is on baby aspirin now.  She was on Xarelto for 3 months with a superficial thrombus in the right thigh.  She has a little pain in this area every now and then.  I told her she can take a little Motrin if necessary.  She has had no issues with her left hip.  She had this replaced 2 years ago.  She has had no fever.  She has had no bleeding.  There has been no change in bowel or bladder habits.  Her last mammogram was done back in July.  This was of the left breast. Mammogram looked fine with no suspicious calcifications.  PHYSICAL EXAM:  General:  This is a well-developed, well-nourished white female in no obvious distress.  Vital signs:  Temperature of 98.3, pulse 69, respiratory rate 16, blood pressure 149/54.  Weight is 202.  Head exam:  Normocephalic, atraumatic skull.  There are no ocular or oral lesions.  There are no palpable cervical or supraclavicular lymph nodes. Lungs:  Clear bilaterally.  Cardiac:  Regular rate and rhythm with a normal S1 and S2.  There are no murmurs, rubs or bruits.  Breasts: Shows left breast with no masses, edema or erythema.  There is no left axillary adenopathy.  Right chest wall, she has a well-healed mastectomy.  She has radiation telangiectasias on the right anterior chest wall.  Abdomen:  Soft.   She has good bowel sounds.  There is no fluid wave.  There is no palpable hepatosplenomegaly.  Back:  No tenderness over the spine, ribs, or hips.  Extremities:  Show no clubbing, cyanosis, or edema.  Neurological:  Shows no focal neurological deficits.  Skin:  No rashes, ecchymoses or petechia.  She does have the telangiectasias on the right anterior chest wall.  LABORATORY STUDIES:  White cell count is 5.3, hemoglobin 10.9, hematocrit 34.1, platelet count is 201.  IMPRESSION:  [name] is a very nice 62 year old white female with history of recurrent adenocarcinoma of the right breast.  This was a local recurrence.  She underwent resection followed by radiation therapy.  She is on Aromasin.  So far we have found no evidence of recurrence otherwise.  PLAN:  We will plan to get her back in 4 months' time now.    ______________________________ Josph Macho, M.D. PRE/MEDQ  D:  06/02/2013  T:  06/03/2013  Job:  [id number]

## 2013-06-10 ENCOUNTER — Encounter (INDEPENDENT_AMBULATORY_CARE_PROVIDER_SITE_OTHER): Payer: Self-pay | Admitting: General Surgery

## 2013-06-10 ENCOUNTER — Ambulatory Visit (INDEPENDENT_AMBULATORY_CARE_PROVIDER_SITE_OTHER): Payer: BC Managed Care – PPO | Admitting: General Surgery

## 2013-06-10 VITALS — BP 150/70 | HR 78 | Temp 98.1°F | Resp 15 | Ht 64.0 in | Wt 199.4 lb

## 2013-06-10 DIAGNOSIS — Z853 Personal history of malignant neoplasm of breast: Secondary | ICD-10-CM

## 2013-06-10 NOTE — Progress Notes (Signed)
Operation: Right modified radical mastectomy  Date: July 24, 1997   Hormone receptor status: Positive  HPI: Mrs. Ringley is here for long-term followup of a right breast cancer. She had her second right chest wall recurrence had excision and radiation therapy. She is  on a Aromasin.  She denies any new nodules on the right chest wall or masses in the left breast. No adenopathy. Lymphedema in the right arm is the same.  Her mother passed away recently.  Last mammogram of the left breast did not show any suspious leasions  PE: Right chest wall-transverse scar with radiation changes of the skin; no nodules palpable. Left breast-no palpable masses or suspicious skin changes; lateral scar noted.  Lymph nodes-no axillary or supraclavicular adenopathy.  Assessment:  Right breast cancer with 2 chest wall recurrences. No clinical evidence of any further chest wall recurrences following her radiation therapy. Lymphedema of the right arm is unchanged.  Plan:   Return visit 6 months.

## 2013-06-10 NOTE — Patient Instructions (Signed)
Call if you feel any nodules in your chest wall or left breast.

## 2013-07-25 ENCOUNTER — Other Ambulatory Visit: Payer: Self-pay | Admitting: Hematology & Oncology

## 2013-08-05 ENCOUNTER — Other Ambulatory Visit (INDEPENDENT_AMBULATORY_CARE_PROVIDER_SITE_OTHER): Payer: Self-pay | Admitting: *Deleted

## 2013-08-05 MED ORDER — UNABLE TO FIND: Status: DC

## 2013-09-12 ENCOUNTER — Other Ambulatory Visit: Payer: Self-pay | Admitting: Hematology & Oncology

## 2013-10-03 ENCOUNTER — Other Ambulatory Visit (HOSPITAL_BASED_OUTPATIENT_CLINIC_OR_DEPARTMENT_OTHER): Payer: BC Managed Care – PPO | Admitting: Lab

## 2013-10-03 ENCOUNTER — Ambulatory Visit (HOSPITAL_BASED_OUTPATIENT_CLINIC_OR_DEPARTMENT_OTHER): Payer: BC Managed Care – PPO | Admitting: Hematology & Oncology

## 2013-10-03 VITALS — BP 140/62 | HR 67 | Temp 98.0°F | Resp 14 | Ht 64.0 in | Wt 178.0 lb

## 2013-10-03 DIAGNOSIS — C50911 Malignant neoplasm of unspecified site of right female breast: Secondary | ICD-10-CM

## 2013-10-03 DIAGNOSIS — C50919 Malignant neoplasm of unspecified site of unspecified female breast: Secondary | ICD-10-CM

## 2013-10-03 DIAGNOSIS — T50905S Adverse effect of unspecified drugs, medicaments and biological substances, sequela: Secondary | ICD-10-CM

## 2013-10-03 DIAGNOSIS — T38805A Adverse effect of unspecified hormones and synthetic substitutes, initial encounter: Secondary | ICD-10-CM

## 2013-10-03 LAB — CMP (CANCER CENTER ONLY)
ALT(SGPT): 19 U/L (ref 10–47)
AST: 20 U/L (ref 11–38)
Albumin: 3.9 g/dL (ref 3.3–5.5)
Alkaline Phosphatase: 66 U/L (ref 26–84)
BUN, Bld: 11 mg/dL (ref 7–22)
Potassium: 4.2 mEq/L (ref 3.3–4.7)
Sodium: 145 mEq/L (ref 128–145)

## 2013-10-03 LAB — CBC WITH DIFFERENTIAL (CANCER CENTER ONLY)
BASO#: 0 10*3/uL (ref 0.0–0.2)
BASO%: 0.4 % (ref 0.0–2.0)
EOS%: 3.5 % (ref 0.0–7.0)
MCH: 28.3 pg (ref 26.0–34.0)
MCHC: 31.4 g/dL — ABNORMAL LOW (ref 32.0–36.0)
MONO%: 7.9 % (ref 0.0–13.0)
NEUT#: 2.9 10*3/uL (ref 1.5–6.5)
Platelets: 185 10*3/uL (ref 145–400)
RDW: 13.9 % (ref 11.1–15.7)

## 2013-10-03 NOTE — Progress Notes (Signed)
This office note has been dictated.

## 2013-10-04 NOTE — Progress Notes (Signed)
CC:   [name] Rakes, MD  DIAGNOSIS: 1. Locally recurrent adenocarcinoma of the right breast. 2. History of superficial venous thrombus of the right thigh.  CURRENT THERAPY: 1. Aromasin 25 mg p.o. daily. 2. Aspirin 162 mg p.o. daily.  INTERIM HISTORY:  [name] comes in for followup.  She has lost 24 pounds since we last saw her.  She is on Weight Watchers right now.  She is very happy with her weight loss.  She feels better.  She has had no problems with cough or shortness of breath.  She is still working.  It sounds like she may retire early next year.  She has had no problems with bowels or bladder.  She has had no leg swelling.  She has the occasional discomfort in the right upper inner thigh, but this is infrequent.  She has had no problems with nausea or vomiting.  She has had no fevers. She has had a lot of arthritic issues and has occasional bony pain.  PHYSICAL EXAMINATION:  This is a well-developed, well-nourished white female, in no obvious distress.  Vital Signs:  Show temperature of 98, pulse 67, respiratory rate 14, blood pressure 140/62, weight is 178 pounds.  Head and Neck Exam:  Shows a normocephalic, atraumatic skull. There are no ocular or oral lesions.  She has no palpable cervical or supraclavicular lymph nodes.  Lungs:  Clear bilaterally.  Cardiac Exam: Regular rate and rhythm with a normal S1 and S2.  There are no murmurs, rubs, or bruits.  Abdomen:  Soft.  She has good bowel sounds.  There is no fluid wave.  There is no palpable abdominal mass.  There is no palpable hepatosplenomegaly.  Breast Exam:  Shows left breast with no masses, edema, or erythema.  There is no left axillary adenopathy. Right chest wall shows well-healed mastectomy.  She has radiation telangiectasias on the right anterior chest wall.  There is a lot of firmness on the right anterior chest wall.  No distinct mass is noted on the right anterior chest wall.  There is no right  axillary adenopathy. Back:  No tenderness over the spine, ribs, or hips.  Extremities: Chronic lymphedema on the right arm.  Lower extremities are unremarkable.  No venous cord is noted.  No erythema is noted in the inner thigh.  Neurological:  No focal neurological deficits.  LABORATORY STUDIES:  White cell count is 5.2, hemoglobin 11, hematocrit 35, platelet count 185.  IMPRESSION:  [name] is a very charming 62 year old white female with history of locally recurrent adenocarcinoma of the breast.  This was of the right breast.  She underwent resection and radiation therapy.  She had radiation therapy back in, I think, 2012.  We have her on Aromasin.  I do not see any evidence of recurrent disease as a problem for [place].  We will plan to get her back in, I think, 4 more months.  I think this would be appropriate time for a followup.    ______________________________ Josph Macho, M.D. PRE/MEDQ  D:  10/03/2013  T:  10/04/2013  Job:  [id number]

## 2013-10-10 ENCOUNTER — Encounter: Payer: Self-pay | Admitting: Nurse Practitioner

## 2013-10-10 ENCOUNTER — Telehealth: Payer: Self-pay | Admitting: Hematology & Oncology

## 2013-10-10 NOTE — Telephone Encounter (Signed)
Pt called said had history of blood clots and her arm is hurting. Transferred call to [facility]

## 2013-10-10 NOTE — Progress Notes (Signed)
Pt called stating her L arm is hurting although she has lymphedema in this arm, she does have a hx of DVT. She states "it hurts from her elbow up to armpit area." "Denies any redness, just pain and discomfort. Per Dr. Myna Hidalgo pt instructed to have this evaluated by her local ER. Pt verbalized understanding and appreciation.

## 2013-11-16 ENCOUNTER — Ambulatory Visit (INDEPENDENT_AMBULATORY_CARE_PROVIDER_SITE_OTHER): Payer: BC Managed Care – PPO | Admitting: General Surgery

## 2013-11-16 ENCOUNTER — Encounter (INDEPENDENT_AMBULATORY_CARE_PROVIDER_SITE_OTHER): Payer: Self-pay | Admitting: General Surgery

## 2013-11-16 VITALS — BP 148/70 | HR 80 | Resp 16 | Ht 64.0 in | Wt 167.2 lb

## 2013-11-16 DIAGNOSIS — C50919 Malignant neoplasm of unspecified site of unspecified female breast: Secondary | ICD-10-CM

## 2013-11-16 NOTE — Patient Instructions (Signed)
If your shoulder pain continues, I would recommend seeing an orthopedic surgeon.

## 2013-11-16 NOTE — Progress Notes (Signed)
Operation: Right modified radical mastectomy  Date: July 24, 1997   Hormone receptor status: Positive  HPI: [name] is here for long-term followup of a right breast cancer. She had her second right chest wall recurrence and underwent excision and radiation therapy. She is on a Aromasin.  She denies any new nodules on the right chest wall or masses in the left breast. No adenopathy. Lymphedema in the right arm is the same. She had some tender swelling in the right biceps area. Duplex was negative for DVT. She was placed on some antibiotics this can better.  Her biggest complaints today are of joint pain in her hands as well as right shoulder pain.  PE: Right chest wall-transverse scar with radiation changes of the skin; no nodules palpable. Left breast-no palpable masses or suspicious skin changes; lateral scar noted.  Lymph nodes-no axillary or supraclavicular adenopathy.  Right shoulder-there is a "popping" sensation with certain ranges of motion. This is associated with tenderness.  Assessment:  Right breast cancer with 2 chest wall recurrences. No clinical evidence of any further chest wall recurrences following her radiation therapy. Lymphedema of the right arm is unchanged.  She is also having some problems with joint pain. She does have a history of rheumatoid arthritis and her family. As for her shoulder pain, this may be something that needs to be seen by an orthopedic surgeon.  Plan:   Return visit 6 months.  I told her she may need to be seen by an orthopedic surgeon and a rheumatologist. I will leave this up to Dr. Nyra Capes.

## 2013-11-28 DIAGNOSIS — I10 Essential (primary) hypertension: Secondary | ICD-10-CM | POA: Diagnosis not present

## 2013-11-28 DIAGNOSIS — M19019 Primary osteoarthritis, unspecified shoulder: Secondary | ICD-10-CM | POA: Diagnosis not present

## 2013-11-28 DIAGNOSIS — M67919 Unspecified disorder of synovium and tendon, unspecified shoulder: Secondary | ICD-10-CM | POA: Diagnosis not present

## 2013-11-28 DIAGNOSIS — E7800 Pure hypercholesterolemia, unspecified: Secondary | ICD-10-CM | POA: Diagnosis not present

## 2013-11-28 DIAGNOSIS — Z79899 Other long term (current) drug therapy: Secondary | ICD-10-CM | POA: Diagnosis not present

## 2013-12-01 DIAGNOSIS — Z1389 Encounter for screening for other disorder: Secondary | ICD-10-CM | POA: Diagnosis not present

## 2013-12-01 DIAGNOSIS — M19019 Primary osteoarthritis, unspecified shoulder: Secondary | ICD-10-CM | POA: Diagnosis not present

## 2013-12-01 DIAGNOSIS — Z6830 Body mass index (BMI) 30.0-30.9, adult: Secondary | ICD-10-CM | POA: Diagnosis not present

## 2013-12-05 DIAGNOSIS — Z01419 Encounter for gynecological examination (general) (routine) without abnormal findings: Secondary | ICD-10-CM | POA: Diagnosis not present

## 2013-12-19 DIAGNOSIS — I10 Essential (primary) hypertension: Secondary | ICD-10-CM | POA: Diagnosis not present

## 2013-12-19 DIAGNOSIS — E782 Mixed hyperlipidemia: Secondary | ICD-10-CM | POA: Diagnosis not present

## 2013-12-26 DIAGNOSIS — E663 Overweight: Secondary | ICD-10-CM | POA: Diagnosis not present

## 2013-12-26 DIAGNOSIS — E782 Mixed hyperlipidemia: Secondary | ICD-10-CM | POA: Diagnosis not present

## 2013-12-26 DIAGNOSIS — I10 Essential (primary) hypertension: Secondary | ICD-10-CM | POA: Diagnosis not present

## 2014-02-01 ENCOUNTER — Encounter: Payer: Self-pay | Admitting: Hematology & Oncology

## 2014-02-01 ENCOUNTER — Other Ambulatory Visit (HOSPITAL_BASED_OUTPATIENT_CLINIC_OR_DEPARTMENT_OTHER): Payer: BC Managed Care – PPO | Admitting: Lab

## 2014-02-01 ENCOUNTER — Ambulatory Visit (HOSPITAL_BASED_OUTPATIENT_CLINIC_OR_DEPARTMENT_OTHER): Payer: BC Managed Care – PPO | Admitting: Hematology & Oncology

## 2014-02-01 VITALS — BP 134/61 | HR 65 | Temp 97.6°F | Resp 14 | Ht 66.0 in | Wt 157.0 lb

## 2014-02-01 DIAGNOSIS — D649 Anemia, unspecified: Secondary | ICD-10-CM

## 2014-02-01 DIAGNOSIS — C50919 Malignant neoplasm of unspecified site of unspecified female breast: Secondary | ICD-10-CM

## 2014-02-01 DIAGNOSIS — C50911 Malignant neoplasm of unspecified site of right female breast: Secondary | ICD-10-CM

## 2014-02-01 DIAGNOSIS — E559 Vitamin D deficiency, unspecified: Secondary | ICD-10-CM

## 2014-02-01 DIAGNOSIS — I82819 Embolism and thrombosis of superficial veins of unspecified lower extremities: Secondary | ICD-10-CM | POA: Diagnosis not present

## 2014-02-01 LAB — CMP (CANCER CENTER ONLY)
ALK PHOS: 63 U/L (ref 26–84)
ALT(SGPT): 18 U/L (ref 10–47)
AST: 22 U/L (ref 11–38)
Albumin: 4.1 g/dL (ref 3.3–5.5)
BUN: 15 mg/dL (ref 7–22)
CO2: 32 mEq/L (ref 18–33)
Calcium: 9.4 mg/dL (ref 8.0–10.3)
Chloride: 102 mEq/L (ref 98–108)
Creat: 0.6 mg/dl (ref 0.6–1.2)
Glucose, Bld: 98 mg/dL (ref 73–118)
POTASSIUM: 3.4 meq/L (ref 3.3–4.7)
SODIUM: 144 meq/L (ref 128–145)
TOTAL PROTEIN: 7.4 g/dL (ref 6.4–8.1)
Total Bilirubin: 0.7 mg/dl (ref 0.20–1.60)

## 2014-02-01 LAB — CBC WITH DIFFERENTIAL (CANCER CENTER ONLY)
BASO#: 0 10*3/uL (ref 0.0–0.2)
BASO%: 0.2 % (ref 0.0–2.0)
EOS%: 3.2 % (ref 0.0–7.0)
Eosinophils Absolute: 0.2 10*3/uL (ref 0.0–0.5)
HCT: 34.6 % — ABNORMAL LOW (ref 34.8–46.6)
HGB: 11.3 g/dL — ABNORMAL LOW (ref 11.6–15.9)
LYMPH#: 2 10*3/uL (ref 0.9–3.3)
LYMPH%: 37.9 % (ref 14.0–48.0)
MCH: 28.9 pg (ref 26.0–34.0)
MCHC: 32.7 g/dL (ref 32.0–36.0)
MCV: 89 fL (ref 81–101)
MONO#: 0.5 10*3/uL (ref 0.1–0.9)
MONO%: 8.5 % (ref 0.0–13.0)
NEUT%: 50.2 % (ref 39.6–80.0)
NEUTROS ABS: 2.7 10*3/uL (ref 1.5–6.5)
Platelets: 178 10*3/uL (ref 145–400)
RBC: 3.91 10*6/uL (ref 3.70–5.32)
RDW: 14.2 % (ref 11.1–15.7)
WBC: 5.3 10*3/uL (ref 3.9–10.0)

## 2014-02-01 NOTE — Progress Notes (Signed)
Hematology and Oncology Follow Up Visit  [name] [id number] 1951-08-17 63 y.o. 02/01/2014   Principle Diagnosis:  Locally recurrent adenocarcinoma of the right breast. 2. History of superficial venous thrombus of the right thigh.   Current Therapy:   1. Aromasin 25 mg p.o. daily. 2. Aspirin 162 mg p.o. daily.     Interim History:  [name] is back for followup. Last saw back in December. She will is good. She's been on a weight loss program. She's lost 21 pounds. She feels better.  She is now in a great-grandmother. She's quite happy about this. Her granddaughter in [place] state had a baby.  She is still working. Also mother she has a retired. She is pacing herself at work. She's not complaining of any pain in her right thigh. She did have a superficial thrombus in this area. She is now on aspirin.  She does have some arthritis. This has been chronic for her.  Been no change in bowel or bladder habits. She's had no nausea vomiting. She's had no fatigue. Said no leg swelling. She's had no rashes.  Medications: Current outpatient prescriptions:AZOR 5-20 MG per tablet, Take 1 tablet by mouth daily. , Disp: , Rfl: ;  exemestane (AROMASIN) 25 MG tablet, TAKE 1 TABLET BY MOUTH EVERY DAY, Disp: 30 tablet, Rfl: 11;  hydrochlorothiazide 25 MG tablet, Take 25 mg by mouth daily. , Disp: , Rfl: ;  ibuprofen (ADVIL,MOTRIN) 200 MG tablet, Take 200 mg by mouth every 6 (six) hours as needed., Disp: , Rfl:  nabumetone (RELAFEN) 500 MG tablet, Take 500 mg by mouth 2 (two) times daily as needed., Disp: , Rfl: ;  simvastatin (ZOCOR) 10 MG tablet, Take by mouth daily. , Disp: , Rfl: ;  Vitamin D, Ergocalciferol, (DRISDOL) 50000 UNITS CAPS capsule, TAKE ONE CAPSULE BY MOUTH EVERY WEEK, Disp: 16 capsule, Rfl: 3 UNABLE TO FIND, Rx: L8000- Post Surgical bras (Quantity: 6) S9702- Non-Silicone Breast Prosthesis (Quantity: 1) O3785- Silicone Breast Prosthesis (Quantity: 1) Dx: 174.9; Right mastectomy, Disp: 1  each, Rfl: 0  Allergies:  Allergies  Allergen Reactions  . Dye Fdc Blue [Brilliant Blue Fcf (Fd&C Blue #1)]     Gives pt hives and causes nausea  CT scan dye    Past Medical History, Surgical history, Social history, and Family History were reviewed and updated.  Review of Systems: As above  Physical Exam:  height is 5\' 6"  (1.676 m) and weight is 157 lb (71.215 kg). Her oral temperature is 97.6 F (36.4 C). Her blood pressure is 134/61 and her pulse is 65. Her respiration is 14.   [name] and well-nourished white female. Lungs are clear. Cardiac exam regular rate and rhythm with no murmurs rubs or bruits. Abdomen is soft. Has good bowel sounds. There is no fluid wave. There is a probable bone mass. There is a palpable hepatospleno megaly. Breasts exam shows the left breast with no masses edema or erythema. There is no left axillary adenopathy. Right chest wall shows radiation telangiectasias. These are chronic. Right is without nodules. There is no right axillary adenopathy. Back exam no tenderness over the spine ribs or hips. Extremities shows some lymphedema of the right arm but this is improved. Has good range of motion of her joints.chest wall  Lab Results  Component Value Date   WBC 5.3 02/01/2014   HGB 11.3* 02/01/2014   HCT 34.6* 02/01/2014   MCV 89 02/01/2014   PLT 178 02/01/2014     Chemistry  Component Value Date/Time   NA 144 02/01/2014 1010   NA 144 06/02/2013 0857   K 3.4 02/01/2014 1010   K 3.7 06/02/2013 0857   CL 102 02/01/2014 1010   CL 108 06/02/2013 0857   CO2 32 02/01/2014 1010   CO2 26 06/02/2013 0857   BUN 15 02/01/2014 1010   BUN 15 06/02/2013 0857   CREATININE 0.6 02/01/2014 1010   CREATININE 0.60 06/02/2013 0857      Component Value Date/Time   CALCIUM 9.4 02/01/2014 1010   CALCIUM 9.1 06/02/2013 0857   ALKPHOS 63 02/01/2014 1010   ALKPHOS 64 06/02/2013 0857   AST 22 02/01/2014 1010   AST 13 06/02/2013 0857   ALT 18 02/01/2014 1010   ALT 13 06/02/2013  0857   BILITOT 0.70 02/01/2014 1010   BILITOT 0.5 06/02/2013 0857         Impression and Plan: [name] is  63 year old white female. She has a history of locally recurrent adenocarcinoma of the right breast. She now is out from resection and radiation by 3 years. She is on Aromasin. She's done well with Aromasin. I don't see any evidence of recurrence.  I was glad that she lost the weight. She feels better.  We'll plan to go back in another 6 months now. She knows to give [place] a call if any problems arise before then.    [name], MD 4/22/20156:39 PM

## 2014-02-02 LAB — VITAMIN D 25 HYDROXY (VIT D DEFICIENCY, FRACTURES): VIT D 25 HYDROXY: 56 ng/mL (ref 30–89)

## 2014-02-02 LAB — LACTATE DEHYDROGENASE: LDH: 179 U/L (ref 94–250)

## 2014-03-28 ENCOUNTER — Other Ambulatory Visit: Payer: Self-pay | Admitting: Hematology & Oncology

## 2014-03-28 DIAGNOSIS — Z1231 Encounter for screening mammogram for malignant neoplasm of breast: Secondary | ICD-10-CM

## 2014-04-16 DIAGNOSIS — H019 Unspecified inflammation of eyelid: Secondary | ICD-10-CM | POA: Diagnosis not present

## 2014-04-16 DIAGNOSIS — B999 Unspecified infectious disease: Secondary | ICD-10-CM | POA: Diagnosis not present

## 2014-04-27 ENCOUNTER — Other Ambulatory Visit: Payer: Self-pay | Admitting: Nurse Practitioner

## 2014-04-28 ENCOUNTER — Ambulatory Visit (HOSPITAL_COMMUNITY)
Admission: RE | Admit: 2014-04-28 | Discharge: 2014-04-28 | Disposition: A | Discharge status: Home / Self Care | Payer: BC Managed Care – PPO | Source: Ambulatory Visit | Attending: Hematology & Oncology | Admitting: Hematology & Oncology

## 2014-04-28 ENCOUNTER — Other Ambulatory Visit: Payer: Self-pay | Admitting: *Deleted

## 2014-04-28 DIAGNOSIS — Z1231 Encounter for screening mammogram for malignant neoplasm of breast: Secondary | ICD-10-CM | POA: Diagnosis not present

## 2014-04-28 DIAGNOSIS — C50911 Malignant neoplasm of unspecified site of right female breast: Secondary | ICD-10-CM

## 2014-04-28 MED ORDER — EXEMESTANE 25 MG PO TABS: ORAL_TABLET | ORAL | Status: DC

## 2014-05-01 ENCOUNTER — Telehealth: Payer: Self-pay | Admitting: Hematology & Oncology

## 2014-05-01 NOTE — Telephone Encounter (Signed)
[employer] approved EXEMESTANE Cecile Hearing WER:[id number] Status: Approved Dates: 04/28/2014-04/28/2015    COPY SCANNED

## 2014-05-17 DIAGNOSIS — R1900 Intra-abdominal and pelvic swelling, mass and lump, unspecified site: Secondary | ICD-10-CM | POA: Diagnosis not present

## 2014-05-17 DIAGNOSIS — R1909 Other intra-abdominal and pelvic swelling, mass and lump: Secondary | ICD-10-CM | POA: Diagnosis not present

## 2014-05-25 ENCOUNTER — Ambulatory Visit (INDEPENDENT_AMBULATORY_CARE_PROVIDER_SITE_OTHER): Payer: BC Managed Care – PPO | Admitting: General Surgery

## 2014-05-25 VITALS — BP 136/68 | HR 79 | Temp 98.1°F | Ht 64.0 in | Wt 146.0 lb

## 2014-05-25 DIAGNOSIS — C50911 Malignant neoplasm of unspecified site of right female breast: Secondary | ICD-10-CM

## 2014-05-25 DIAGNOSIS — C50919 Malignant neoplasm of unspecified site of unspecified female breast: Secondary | ICD-10-CM

## 2014-05-25 NOTE — Progress Notes (Signed)
Operation: Right modified radical mastectomy  Date: July 24, 1997   Hormone receptor status: Positive  HPI: [name] is here for long-term followup of a right breast cancer. She had her second right chest wall recurrence and underwent excision and radiation therapy. She is on a Aromasin.  She denies any new nodules on the right chest wall or masses in the left breast. No adenopathy. Lymphedema in the right arm is the same.   PE: Right chest wall-transverse scar with radiation changes of the skin; no nodules palpable. Left breast-no palpable masses or suspicious skin changes; lateral scar noted.  Lymph nodes-no axillary or supraclavicular adenopathy.  Right arm lymphedema is unchanged  Assessment:  Right breast cancer with 2 chest wall recurrences. No clinical evidence of any further chest wall recurrences following her radiation therapy. Lymphedema of the right arm is unchanged.    Plan:   Return visit 8 months. This way, she can be seen by Dr. Marin Olp and myself alternating every 6 months.

## 2014-05-25 NOTE — Patient Instructions (Signed)
Call if you find any new lumps in your breasts or chest wall. 

## 2014-07-18 DIAGNOSIS — H6091 Unspecified otitis externa, right ear: Secondary | ICD-10-CM | POA: Diagnosis not present

## 2014-08-07 ENCOUNTER — Ambulatory Visit (HOSPITAL_BASED_OUTPATIENT_CLINIC_OR_DEPARTMENT_OTHER): Payer: BC Managed Care – PPO | Admitting: Hematology & Oncology

## 2014-08-07 ENCOUNTER — Other Ambulatory Visit (HOSPITAL_BASED_OUTPATIENT_CLINIC_OR_DEPARTMENT_OTHER): Payer: BC Managed Care – PPO | Admitting: Lab

## 2014-08-07 ENCOUNTER — Ambulatory Visit (HOSPITAL_BASED_OUTPATIENT_CLINIC_OR_DEPARTMENT_OTHER): Payer: BC Managed Care – PPO

## 2014-08-07 ENCOUNTER — Encounter: Payer: Self-pay | Admitting: Hematology & Oncology

## 2014-08-07 VITALS — BP 140/66 | HR 59 | Temp 98.0°F | Resp 18 | Ht 63.0 in | Wt 142.0 lb

## 2014-08-07 DIAGNOSIS — E559 Vitamin D deficiency, unspecified: Secondary | ICD-10-CM

## 2014-08-07 DIAGNOSIS — Z23 Encounter for immunization: Secondary | ICD-10-CM

## 2014-08-07 DIAGNOSIS — D649 Anemia, unspecified: Secondary | ICD-10-CM

## 2014-08-07 DIAGNOSIS — C50911 Malignant neoplasm of unspecified site of right female breast: Secondary | ICD-10-CM

## 2014-08-07 LAB — CMP (CANCER CENTER ONLY)
ALK PHOS: 51 U/L (ref 26–84)
ALT: 17 U/L (ref 10–47)
AST: 21 U/L (ref 11–38)
Albumin: 3.7 g/dL (ref 3.3–5.5)
BUN: 16 mg/dL (ref 7–22)
CALCIUM: 9 mg/dL (ref 8.0–10.3)
CO2: 28 mEq/L (ref 18–33)
Chloride: 106 mEq/L (ref 98–108)
Creat: 0.7 mg/dl (ref 0.6–1.2)
Glucose, Bld: 97 mg/dL (ref 73–118)
POTASSIUM: 3.5 meq/L (ref 3.3–4.7)
SODIUM: 148 meq/L — AB (ref 128–145)
TOTAL PROTEIN: 7.1 g/dL (ref 6.4–8.1)
Total Bilirubin: 0.7 mg/dl (ref 0.20–1.60)

## 2014-08-07 LAB — IRON AND TIBC CHCC
%SAT: 14 % — ABNORMAL LOW (ref 21–57)
Iron: 36 ug/dL — ABNORMAL LOW (ref 41–142)
TIBC: 254 ug/dL (ref 236–444)
UIBC: 218 ug/dL (ref 120–384)

## 2014-08-07 LAB — CBC WITH DIFFERENTIAL (CANCER CENTER ONLY)
BASO#: 0 10*3/uL (ref 0.0–0.2)
BASO%: 0.2 % (ref 0.0–2.0)
EOS%: 2.5 % (ref 0.0–7.0)
Eosinophils Absolute: 0.1 10*3/uL (ref 0.0–0.5)
HCT: 34.9 % (ref 34.8–46.6)
HEMOGLOBIN: 11.4 g/dL — AB (ref 11.6–15.9)
LYMPH#: 1.6 10*3/uL (ref 0.9–3.3)
LYMPH%: 36.6 % (ref 14.0–48.0)
MCH: 29.2 pg (ref 26.0–34.0)
MCHC: 32.7 g/dL (ref 32.0–36.0)
MCV: 90 fL (ref 81–101)
MONO#: 0.4 10*3/uL (ref 0.1–0.9)
MONO%: 8.6 % (ref 0.0–13.0)
NEUT%: 52.1 % (ref 39.6–80.0)
NEUTROS ABS: 2.3 10*3/uL (ref 1.5–6.5)
Platelets: 165 10*3/uL (ref 145–400)
RBC: 3.9 10*6/uL (ref 3.70–5.32)
RDW: 13.3 % (ref 11.1–15.7)
WBC: 4.4 10*3/uL (ref 3.9–10.0)

## 2014-08-07 LAB — FERRITIN CHCC: Ferritin: 171 ng/ml (ref 9–269)

## 2014-08-07 MED ORDER — INFLUENZA VAC SPLIT QUAD 0.5 ML IM SUSY
0.5000 mL | PREFILLED_SYRINGE | Freq: Once | INTRAMUSCULAR | Status: DC
  Administered 2014-08-07: 0.5 mL via INTRAMUSCULAR
  Filled 2014-08-07: qty 0.5

## 2014-08-07 NOTE — Patient Instructions (Signed)

## 2014-08-07 NOTE — Progress Notes (Signed)
Hematology and Oncology Follow Up Visit  [name] [id number] 1951-02-17 63 y.o. 08/07/2014   Principle Diagnosis:  Locally recurrent adenocarcinoma of the right breast. 2. History of superficial venous thrombus of the right thigh.   Current Therapy:   1. Aromasin 25 mg p.o. daily. 2. Aspirin 162 mg p.o. daily.     Interim History:  Ms.  Laske is back for followup. We last saw her back in April . She is doing well. She's been on a weight loss program. She's lost about 35 pounds. She actually feels a little worse. She was again a little bit more weight. I can understand this.  She wants to get off her blood pressure medications. She is on 2 different blood pressure medications. I told her that she probably stop the hydrochlorothiazide. I really don't see a problem with her stopping this.  She is still working part-time  She is pacing herself at work. She's not complaining of any pain in her right thigh. She did have a superficial thrombus in this area. She is now on aspirin.  She does have some arthritis. This has been chronic for her.  She had some bouts of gastroenteritis over the summer. It sounds like she had 3 different episodes., Sure what triggered all this. She went to her family doctor. Her gallbladder is already out. These episodes were self limited. Medications: Current outpatient prescriptions:AZOR 5-20 MG per tablet, Take 1 tablet by mouth daily. , Disp: , Rfl: ;  exemestane (AROMASIN) 25 MG tablet, TAKE 1 TABLET BY MOUTH EVERY DAY, Disp: 30 tablet, Rfl: 11;  hydrochlorothiazide 25 MG tablet, Take 25 mg by mouth daily. , Disp: , Rfl: ;  ibuprofen (ADVIL,MOTRIN) 200 MG tablet, Take 200 mg by mouth every 6 (six) hours as needed., Disp: , Rfl:  simvastatin (ZOCOR) 10 MG tablet, Take by mouth daily. , Disp: , Rfl: ;  UNABLE TO FIND, Rx: L8000- Post Surgical bras (Quantity: 6) J3354- Non-Silicone Breast Prosthesis (Quantity: 1) T6256- Silicone Breast Prosthesis (Quantity: 1) Dx:  174.9; Right mastectomy, Disp: 1 each, Rfl: 0;  Vitamin D, Ergocalciferol, (DRISDOL) 50000 UNITS CAPS capsule, TAKE ONE CAPSULE BY MOUTH EVERY WEEK, Disp: 16 capsule, Rfl: 3 Current facility-administered medications:Influenza vac split quadrivalent PF (FLUARIX) injection 0.5 mL, 0.5 mL, Intramuscular, Once, [name], MD  Allergies:  Allergies  Allergen Reactions  . Dye Fdc Blue [Brilliant Blue Fcf (Fd&C Blue #1)]     Gives pt hives and causes nausea  CT scan dye    Past Medical History, Surgical history, Social history, and Family History were reviewed and updated.  Review of Systems: As above  Physical Exam:  height is 5\' 3"  (1.6 m) and weight is 142 lb (64.411 kg). Her oral temperature is 98 F (36.7 C). Her blood pressure is 140/66 and her pulse is 59. Her respiration is 18.   [name] and well-nourished white female. Lungs are clear. Cardiac exam regular rate and rhythm with no murmurs rubs or bruits. Abdomen is soft. Has good bowel sounds. There is no fluid wave. There is a probable bone mass. There is a palpable hepatospleno megaly. Breasts exam shows the left breast with no masses edema or erythema. There is no left axillary adenopathy. Right chest wall shows radiation telangiectasias. These are chronic. Right is without nodules. There is no right axillary adenopathy. Back exam no tenderness over the spine ribs or hips. Extremities shows some lymphedema of the right arm but this is improved. Has good range of motion  of her joints.chest wall  Lab Results  Component Value Date   WBC 4.4 08/07/2014   HGB 11.4* 08/07/2014   HCT 34.9 08/07/2014   MCV 90 08/07/2014   PLT 165 08/07/2014     Chemistry      Component Value Date/Time   NA 144 02/01/2014 1010   NA 144 06/02/2013 0857   K 3.4 02/01/2014 1010   K 3.7 06/02/2013 0857   CL 102 02/01/2014 1010   CL 108 06/02/2013 0857   CO2 32 02/01/2014 1010   CO2 26 06/02/2013 0857   BUN 15 02/01/2014 1010   BUN 15 06/02/2013  0857   CREATININE 0.6 02/01/2014 1010   CREATININE 0.60 06/02/2013 0857      Component Value Date/Time   CALCIUM 9.4 02/01/2014 1010   CALCIUM 9.1 06/02/2013 0857   ALKPHOS 63 02/01/2014 1010   ALKPHOS 64 06/02/2013 0857   AST 22 02/01/2014 1010   AST 13 06/02/2013 0857   ALT 18 02/01/2014 1010   ALT 13 06/02/2013 0857   BILITOT 0.70 02/01/2014 1010   BILITOT 0.5 06/02/2013 0857         Impression and Plan: Ms. Canales is  63 year old white female. She has a history of locally recurrent adenocarcinoma of the right breast. She now is out from resection and radiation by 31/2years. She is on Aromasin. She's done well with Aromasin. I don't see any evidence of recurrence.  I am a little worried about the weight loss. I told her not to lose any more weight.  We'll plan to go back in another 6 months now. She knows to give [place] a call if any problems arise before then.    [name], MD 10/26/201510:31 AM

## 2014-08-08 ENCOUNTER — Telehealth: Payer: Self-pay | Admitting: *Deleted

## 2014-08-08 ENCOUNTER — Telehealth: Payer: Self-pay | Admitting: Hematology & Oncology

## 2014-08-08 LAB — VITAMIN D 25 HYDROXY (VIT D DEFICIENCY, FRACTURES): Vit D, 25-Hydroxy: 67 ng/mL (ref 30–89)

## 2014-08-08 NOTE — Telephone Encounter (Signed)
Spoke with patient. She will set up appointment with Liliane Channel.  ----- Message ----- From: [name], MD Sent: 08/07/2014 6:39 PM To: Onc Nurse Hp Call - Her iron is actually low. Need to set up Feraheme 1020mg  x 1 dose. Please set up in 1-2 weeks. Laurey Arrow

## 2014-08-08 NOTE — Telephone Encounter (Signed)
Left pt message on cell to call for appointment. Home phone no answer or voice mail

## 2014-08-16 ENCOUNTER — Ambulatory Visit (HOSPITAL_BASED_OUTPATIENT_CLINIC_OR_DEPARTMENT_OTHER): Payer: BC Managed Care – PPO

## 2014-08-16 ENCOUNTER — Other Ambulatory Visit: Payer: Self-pay | Admitting: *Deleted

## 2014-08-16 VITALS — BP 136/61 | HR 65 | Temp 98.1°F | Resp 20

## 2014-08-16 DIAGNOSIS — C50911 Malignant neoplasm of unspecified site of right female breast: Secondary | ICD-10-CM

## 2014-08-16 DIAGNOSIS — D509 Iron deficiency anemia, unspecified: Secondary | ICD-10-CM

## 2014-08-16 DIAGNOSIS — E611 Iron deficiency: Secondary | ICD-10-CM | POA: Diagnosis not present

## 2014-08-16 MED ORDER — SODIUM CHLORIDE 0.9 % IV SOLN
Freq: Once | INTRAVENOUS | Status: AC
  Administered 2014-08-16: 11:00:00 via INTRAVENOUS

## 2014-08-16 MED ORDER — SODIUM CHLORIDE 0.9 % IV SOLN: Freq: Once | INTRAVENOUS | Status: DC

## 2014-08-16 MED ORDER — FERUMOXYTOL INJECTION 510 MG/17 ML
1020.0000 mg | Freq: Once | INTRAVENOUS | Status: AC
  Administered 2014-08-16: 1020 mg via INTRAVENOUS
  Filled 2014-08-16: qty 34

## 2014-08-16 MED ORDER — SODIUM CHLORIDE 0.9 % IV SOLN: 1020.0000 mg | Freq: Once | INTRAVENOUS | Status: DC

## 2014-08-16 NOTE — Patient Instructions (Signed)

## 2014-09-27 ENCOUNTER — Other Ambulatory Visit: Payer: Self-pay | Admitting: *Deleted

## 2014-09-27 DIAGNOSIS — C50911 Malignant neoplasm of unspecified site of right female breast: Secondary | ICD-10-CM

## 2014-09-27 MED ORDER — VITAMIN D (ERGOCALCIFEROL) 1.25 MG (50000 UNIT) PO CAPS: 50000.0000 [IU] | ORAL_CAPSULE | ORAL | Status: DC

## 2014-10-30 DIAGNOSIS — E782 Mixed hyperlipidemia: Secondary | ICD-10-CM | POA: Diagnosis not present

## 2014-10-30 DIAGNOSIS — E559 Vitamin D deficiency, unspecified: Secondary | ICD-10-CM | POA: Diagnosis not present

## 2014-10-30 DIAGNOSIS — C50919 Malignant neoplasm of unspecified site of unspecified female breast: Secondary | ICD-10-CM | POA: Diagnosis not present

## 2014-10-30 DIAGNOSIS — I10 Essential (primary) hypertension: Secondary | ICD-10-CM | POA: Diagnosis not present

## 2014-11-14 DIAGNOSIS — E875 Hyperkalemia: Secondary | ICD-10-CM | POA: Diagnosis not present

## 2014-12-19 DIAGNOSIS — Z01419 Encounter for gynecological examination (general) (routine) without abnormal findings: Secondary | ICD-10-CM | POA: Diagnosis not present

## 2015-01-02 DIAGNOSIS — N958 Other specified menopausal and perimenopausal disorders: Secondary | ICD-10-CM | POA: Diagnosis not present

## 2015-02-05 ENCOUNTER — Telehealth: Payer: Self-pay | Admitting: Hematology & Oncology

## 2015-02-05 NOTE — Telephone Encounter (Signed)
Pt moved 4-27 to 5-11 she is sick

## 2015-02-07 ENCOUNTER — Other Ambulatory Visit: Payer: Medicare Other

## 2015-02-07 ENCOUNTER — Ambulatory Visit: Payer: Medicare Other | Admitting: Hematology & Oncology

## 2015-02-21 ENCOUNTER — Other Ambulatory Visit (HOSPITAL_BASED_OUTPATIENT_CLINIC_OR_DEPARTMENT_OTHER): Payer: Medicare Other

## 2015-02-21 ENCOUNTER — Encounter: Payer: Self-pay | Admitting: Hematology & Oncology

## 2015-02-21 ENCOUNTER — Ambulatory Visit (HOSPITAL_BASED_OUTPATIENT_CLINIC_OR_DEPARTMENT_OTHER): Payer: Medicare Other | Admitting: Hematology & Oncology

## 2015-02-21 VITALS — BP 139/64 | HR 66 | Temp 98.0°F | Resp 14 | Ht 63.0 in | Wt 162.0 lb

## 2015-02-21 DIAGNOSIS — C50911 Malignant neoplasm of unspecified site of right female breast: Secondary | ICD-10-CM

## 2015-02-21 DIAGNOSIS — E559 Vitamin D deficiency, unspecified: Secondary | ICD-10-CM | POA: Diagnosis not present

## 2015-02-21 DIAGNOSIS — M810 Age-related osteoporosis without current pathological fracture: Secondary | ICD-10-CM

## 2015-02-21 DIAGNOSIS — C50919 Malignant neoplasm of unspecified site of unspecified female breast: Secondary | ICD-10-CM | POA: Diagnosis not present

## 2015-02-21 LAB — CBC WITH DIFFERENTIAL (CANCER CENTER ONLY)
BASO#: 0 10*3/uL (ref 0.0–0.2)
BASO%: 0.2 % (ref 0.0–2.0)
EOS%: 4.2 % (ref 0.0–7.0)
Eosinophils Absolute: 0.2 10*3/uL (ref 0.0–0.5)
HCT: 34.1 % — ABNORMAL LOW (ref 34.8–46.6)
HEMOGLOBIN: 11.1 g/dL — AB (ref 11.6–15.9)
LYMPH#: 1.5 10*3/uL (ref 0.9–3.3)
LYMPH%: 32.4 % (ref 14.0–48.0)
MCH: 30.7 pg (ref 26.0–34.0)
MCHC: 32.6 g/dL (ref 32.0–36.0)
MCV: 94 fL (ref 81–101)
MONO#: 0.4 10*3/uL (ref 0.1–0.9)
MONO%: 9.1 % (ref 0.0–13.0)
NEUT%: 54.1 % (ref 39.6–80.0)
NEUTROS ABS: 2.4 10*3/uL (ref 1.5–6.5)
PLATELETS: 181 10*3/uL (ref 145–400)
RBC: 3.62 10*6/uL — ABNORMAL LOW (ref 3.70–5.32)
RDW: 13.1 % (ref 11.1–15.7)
WBC: 4.5 10*3/uL (ref 3.9–10.0)

## 2015-02-21 LAB — COMPREHENSIVE METABOLIC PANEL (CC13)
ALT: 16 U/L (ref 0–55)
AST: 18 U/L (ref 5–34)
Albumin: 3.8 g/dL (ref 3.5–5.0)
Alkaline Phosphatase: 69 U/L (ref 40–150)
Anion Gap: 10 mEq/L (ref 3–11)
BUN: 18.8 mg/dL (ref 7.0–26.0)
CALCIUM: 9.1 mg/dL (ref 8.4–10.4)
CO2: 31 meq/L — AB (ref 22–29)
CREATININE: 0.7 mg/dL (ref 0.6–1.1)
Chloride: 107 mEq/L (ref 98–109)
EGFR: 87 mL/min/{1.73_m2} — ABNORMAL LOW (ref >90)
Glucose: 91 mg/dl (ref 70–140)
Potassium: 3.7 mEq/L (ref 3.5–5.1)
Sodium: 147 mEq/L — ABNORMAL HIGH (ref 136–145)
Total Bilirubin: 0.49 mg/dL (ref 0.20–1.20)
Total Protein: 6.8 g/dL (ref 6.4–8.3)

## 2015-02-21 NOTE — Progress Notes (Signed)
Hematology and Oncology Follow Up Visit  Emma-Lee Oddo [id number] Dec 15, 1950 64 y.o. 02/21/2015   Principle Diagnosis:  Locally recurrent adenocarcinoma of the right breast. 2. History of superficial venous thrombus of the right thigh.   Current Therapy:   1. Aromasin 25 mg p.o. daily. 2. Aspirin 162 mg p.o. daily.     Interim History:  Ms.  Schuessler is back for followup. 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  She is having a tough time trying to find things to do. Thankfully, her family is doing good job keeping her busy.  She has gained 20 process we last saw her. We last saw her, she was worried about weight loss.  She has had some shortness of breath. She feels a little bit tight over on the right side of her chest. She has had no cough. There's been no fever. I don't think we need to do any scans on her right now.  She is going for a colonoscopy in July.  She's had no nausea or vomiting. Her appetite has been good.  She's had no leg swelling. There's been no pain in her right leg where she had the superficial thrombus. She still continues on her aspirin. She also takes her vitamin D. Patient is a bone scan recently which showed no osteoporosis or osteopenia.           Medications:  Current outpatient prescriptions:  .  exemestane (AROMASIN) 25 MG tablet, TAKE 1 TABLET BY MOUTH EVERY DAY, Disp: 30 tablet, Rfl: 11 .  hydrochlorothiazide 25 MG tablet, Take 25 mg by mouth daily. , Disp: , Rfl:  .  ibuprofen (ADVIL,MOTRIN) 200 MG tablet, Take 200 mg by mouth every 6 (six) hours as needed., Disp: , Rfl:  .  simvastatin (ZOCOR) 10 MG tablet, Take by mouth daily. , Disp: , Rfl:  .  Telmisartan-Amlodipine 80-5 MG TABS, Take 1 tablet by mouth daily., Disp: , Rfl: 3 .  Vitamin D, Ergocalciferol, (DRISDOL) 50000 UNITS CAPS capsule, Take 1 capsule (50,000 Units total) by mouth once a week., Disp: 16 capsule, Rfl: 3  Allergies:  Allergies   Allergen Reactions  . Dye Fdc Blue [Brilliant Blue Fcf (Fd&C Blue #1)]     Gives pt hives and causes nausea  CT scan dye    Past Medical History, Surgical history, Social history, and Family History were reviewed and updated.  Review of Systems: As above  Physical Exam:  height is 5\' 3"  (1.6 m) and weight is 162 lb (73.483 kg). Her oral temperature is 98 F (36.7 C). Her blood pressure is 139/64 and her pulse is 66. Her respiration is 14.   [name] and well-nourished white female. Lungs are clear. Cardiac exam regular rate and rhythm with no murmurs rubs or bruits. Abdomen is soft. Has good bowel sounds. There is no fluid wave. There is no palpable abdominal mass. There is a palpable hepatosplenomegaly. Breasts exam shows the left breast with no masses edema or erythema. There is no left axillary adenopathy. Right chest wall shows radiation telangiectasias. These are chronic. Right is without nodules. There is no right axillary adenopathy. Back exam no tenderness over the spine ribs or hips. Extremities shows some lymphedema of the right arm but this is improved. Has good range of motion of her joints.chest wall  Lab Results  Component Value Date   WBC 4.5 02/21/2015   HGB 11.1* 02/21/2015   HCT 34.1* 02/21/2015   MCV 94 02/21/2015  PLT 181 02/21/2015     Chemistry      Component Value Date/Time   NA 148* 08/07/2014 0922   NA 144 06/02/2013 0857   K 3.5 08/07/2014 0922   K 3.7 06/02/2013 0857   CL 106 08/07/2014 0922   CL 108 06/02/2013 0857   CO2 28 08/07/2014 0922   CO2 26 06/02/2013 0857   BUN 16 08/07/2014 0922   BUN 15 06/02/2013 0857   CREATININE 0.7 08/07/2014 0922   CREATININE 0.60 06/02/2013 0857      Component Value Date/Time   CALCIUM 9.0 08/07/2014 0922   CALCIUM 9.1 06/02/2013 0857   ALKPHOS 51 08/07/2014 0922   ALKPHOS 64 06/02/2013 0857   AST 21 08/07/2014 0922   AST 13 06/02/2013 0857   ALT 17 08/07/2014 0922   ALT 13 06/02/2013 0857    BILITOT 0.70 08/07/2014 0922   BILITOT 0.5 06/02/2013 0857         Impression and Plan: Ms. Rosekrans is  64 year old white female. She has a history of locally recurrent adenocarcinoma of the right breast. She now is out from resection and radiation by 4 years. She is on Aromasin. She's done well with Aromasin. I don't see any evidence of recurrence.  Now that her weight is back up, I feel better. I don't think she has anything that would suggest recurrence or other issue.  We will still  plan to go back in another 6 months now. She knows to give [place] a call if any problems arise before then.    [name], MD 5/11/201610:06 AM

## 2015-02-22 ENCOUNTER — Telehealth: Payer: Self-pay | Admitting: *Deleted

## 2015-02-22 LAB — VITAMIN D 25 HYDROXY (VIT D DEFICIENCY, FRACTURES): Vit D, 25-Hydroxy: 35 ng/mL (ref 30–100)

## 2015-02-22 NOTE — Telephone Encounter (Addendum)
Patient aware of results  ----- Message from [name], MD sent at 02/22/2015 11:33 AM EDT ----- Call and let her know that the vitamin D level is okay. Keep taking vitamin D as she is doing. Thanks

## 2015-03-06 DIAGNOSIS — Z853 Personal history of malignant neoplasm of breast: Secondary | ICD-10-CM | POA: Diagnosis not present

## 2015-04-02 ENCOUNTER — Other Ambulatory Visit: Payer: Self-pay | Admitting: Hematology & Oncology

## 2015-04-02 DIAGNOSIS — Z1231 Encounter for screening mammogram for malignant neoplasm of breast: Secondary | ICD-10-CM

## 2015-04-06 DIAGNOSIS — Z6828 Body mass index (BMI) 28.0-28.9, adult: Secondary | ICD-10-CM | POA: Diagnosis not present

## 2015-04-06 DIAGNOSIS — M79606 Pain in leg, unspecified: Secondary | ICD-10-CM | POA: Diagnosis not present

## 2015-04-06 DIAGNOSIS — E782 Mixed hyperlipidemia: Secondary | ICD-10-CM | POA: Diagnosis not present

## 2015-04-06 DIAGNOSIS — I10 Essential (primary) hypertension: Secondary | ICD-10-CM | POA: Diagnosis not present

## 2015-04-18 ENCOUNTER — Other Ambulatory Visit: Payer: Self-pay | Admitting: Hematology & Oncology

## 2015-04-30 ENCOUNTER — Ambulatory Visit (HOSPITAL_COMMUNITY)
Admission: RE | Admit: 2015-04-30 | Discharge: 2015-04-30 | Disposition: A | Discharge status: Home / Self Care | Payer: Medicare Other | Source: Ambulatory Visit | Attending: Hematology & Oncology | Admitting: Hematology & Oncology

## 2015-04-30 DIAGNOSIS — Z1231 Encounter for screening mammogram for malignant neoplasm of breast: Secondary | ICD-10-CM | POA: Diagnosis not present

## 2015-04-30 DIAGNOSIS — Z9011 Acquired absence of right breast and nipple: Secondary | ICD-10-CM | POA: Diagnosis not present

## 2015-06-21 DIAGNOSIS — L03113 Cellulitis of right upper limb: Secondary | ICD-10-CM | POA: Diagnosis not present

## 2015-07-03 DIAGNOSIS — Z1211 Encounter for screening for malignant neoplasm of colon: Secondary | ICD-10-CM | POA: Diagnosis not present

## 2015-07-03 DIAGNOSIS — R1013 Epigastric pain: Secondary | ICD-10-CM | POA: Diagnosis not present

## 2015-07-03 DIAGNOSIS — R112 Nausea with vomiting, unspecified: Secondary | ICD-10-CM | POA: Diagnosis not present

## 2015-07-04 DIAGNOSIS — Z6831 Body mass index (BMI) 31.0-31.9, adult: Secondary | ICD-10-CM | POA: Diagnosis not present

## 2015-07-04 DIAGNOSIS — I890 Lymphedema, not elsewhere classified: Secondary | ICD-10-CM | POA: Diagnosis not present

## 2015-07-04 DIAGNOSIS — L03119 Cellulitis of unspecified part of limb: Secondary | ICD-10-CM | POA: Diagnosis not present

## 2015-07-16 ENCOUNTER — Other Ambulatory Visit: Payer: Self-pay | Admitting: Hematology & Oncology

## 2015-07-25 ENCOUNTER — Other Ambulatory Visit: Payer: Self-pay

## 2015-07-25 DIAGNOSIS — Z86010 Personal history of colonic polyps: Secondary | ICD-10-CM | POA: Diagnosis not present

## 2015-07-25 DIAGNOSIS — K209 Esophagitis, unspecified: Secondary | ICD-10-CM | POA: Diagnosis not present

## 2015-07-25 DIAGNOSIS — Z9049 Acquired absence of other specified parts of digestive tract: Secondary | ICD-10-CM | POA: Diagnosis not present

## 2015-07-25 DIAGNOSIS — R1013 Epigastric pain: Secondary | ICD-10-CM | POA: Diagnosis not present

## 2015-07-25 DIAGNOSIS — I10 Essential (primary) hypertension: Secondary | ICD-10-CM | POA: Diagnosis not present

## 2015-07-25 DIAGNOSIS — D122 Benign neoplasm of ascending colon: Secondary | ICD-10-CM | POA: Diagnosis not present

## 2015-07-25 DIAGNOSIS — K2900 Acute gastritis without bleeding: Secondary | ICD-10-CM | POA: Diagnosis not present

## 2015-07-25 DIAGNOSIS — K635 Polyp of colon: Secondary | ICD-10-CM | POA: Diagnosis not present

## 2015-07-25 DIAGNOSIS — Z1211 Encounter for screening for malignant neoplasm of colon: Secondary | ICD-10-CM | POA: Diagnosis not present

## 2015-07-25 DIAGNOSIS — K5730 Diverticulosis of large intestine without perforation or abscess without bleeding: Secondary | ICD-10-CM | POA: Diagnosis not present

## 2015-07-25 DIAGNOSIS — K648 Other hemorrhoids: Secondary | ICD-10-CM | POA: Diagnosis not present

## 2015-07-25 DIAGNOSIS — Z853 Personal history of malignant neoplasm of breast: Secondary | ICD-10-CM | POA: Diagnosis not present

## 2015-07-25 DIAGNOSIS — K2970 Gastritis, unspecified, without bleeding: Secondary | ICD-10-CM | POA: Diagnosis not present

## 2015-07-25 DIAGNOSIS — E785 Hyperlipidemia, unspecified: Secondary | ICD-10-CM | POA: Diagnosis not present

## 2015-08-03 DIAGNOSIS — M7581 Other shoulder lesions, right shoulder: Secondary | ICD-10-CM | POA: Diagnosis not present

## 2015-08-03 DIAGNOSIS — I10 Essential (primary) hypertension: Secondary | ICD-10-CM | POA: Diagnosis not present

## 2015-08-03 DIAGNOSIS — Z23 Encounter for immunization: Secondary | ICD-10-CM | POA: Diagnosis not present

## 2015-08-03 DIAGNOSIS — E782 Mixed hyperlipidemia: Secondary | ICD-10-CM | POA: Diagnosis not present

## 2015-08-24 ENCOUNTER — Other Ambulatory Visit (HOSPITAL_BASED_OUTPATIENT_CLINIC_OR_DEPARTMENT_OTHER): Payer: Medicare Other

## 2015-08-24 ENCOUNTER — Ambulatory Visit (HOSPITAL_BASED_OUTPATIENT_CLINIC_OR_DEPARTMENT_OTHER): Payer: Medicare Other | Admitting: Family

## 2015-08-24 ENCOUNTER — Encounter: Payer: Self-pay | Admitting: Family

## 2015-08-24 VITALS — BP 156/71 | HR 71 | Temp 97.8°F | Wt 178.0 lb

## 2015-08-24 DIAGNOSIS — Z79811 Long term (current) use of aromatase inhibitors: Secondary | ICD-10-CM | POA: Diagnosis not present

## 2015-08-24 DIAGNOSIS — C50911 Malignant neoplasm of unspecified site of right female breast: Secondary | ICD-10-CM

## 2015-08-24 DIAGNOSIS — M810 Age-related osteoporosis without current pathological fracture: Secondary | ICD-10-CM | POA: Diagnosis not present

## 2015-08-24 DIAGNOSIS — C50919 Malignant neoplasm of unspecified site of unspecified female breast: Secondary | ICD-10-CM | POA: Diagnosis not present

## 2015-08-24 DIAGNOSIS — I890 Lymphedema, not elsewhere classified: Secondary | ICD-10-CM

## 2015-08-24 HISTORY — DX: Lymphedema, not elsewhere classified: I89.0

## 2015-08-24 LAB — CBC WITH DIFFERENTIAL (CANCER CENTER ONLY)
BASO#: 0 10*3/uL (ref 0.0–0.2)
BASO%: 0.4 % (ref 0.0–2.0)
EOS%: 2.2 % (ref 0.0–7.0)
Eosinophils Absolute: 0.1 10*3/uL (ref 0.0–0.5)
HEMATOCRIT: 35.7 % (ref 34.8–46.6)
HGB: 11.3 g/dL — ABNORMAL LOW (ref 11.6–15.9)
LYMPH#: 1.5 10*3/uL (ref 0.9–3.3)
LYMPH%: 29.9 % (ref 14.0–48.0)
MCH: 29.4 pg (ref 26.0–34.0)
MCHC: 31.7 g/dL — ABNORMAL LOW (ref 32.0–36.0)
MCV: 93 fL (ref 81–101)
MONO#: 0.4 10*3/uL (ref 0.1–0.9)
MONO%: 8.5 % (ref 0.0–13.0)
NEUT%: 59 % (ref 39.6–80.0)
NEUTROS ABS: 3 10*3/uL (ref 1.5–6.5)
Platelets: 209 10*3/uL (ref 145–400)
RBC: 3.84 10*6/uL (ref 3.70–5.32)
RDW: 13.3 % (ref 11.1–15.7)
WBC: 5.1 10*3/uL (ref 3.9–10.0)

## 2015-08-24 LAB — COMPREHENSIVE METABOLIC PANEL (CC13)
ALT: 24 U/L (ref 0–55)
AST: 20 U/L (ref 5–34)
Albumin: 3.6 g/dL (ref 3.5–5.0)
Alkaline Phosphatase: 74 U/L (ref 40–150)
Anion Gap: 8 mEq/L (ref 3–11)
BUN: 19.8 mg/dL (ref 7.0–26.0)
CALCIUM: 9.4 mg/dL (ref 8.4–10.4)
CO2: 27 mEq/L (ref 22–29)
CREATININE: 0.7 mg/dL (ref 0.6–1.1)
Chloride: 107 mEq/L (ref 98–109)
EGFR: >90 mL/min/{1.73_m2} (ref >90)
Glucose: 94 mg/dl (ref 70–140)
Potassium: 4.1 mEq/L (ref 3.5–5.1)
Sodium: 142 mEq/L (ref 136–145)
TOTAL PROTEIN: 7 g/dL (ref 6.4–8.3)
Total Bilirubin: 0.43 mg/dL (ref 0.20–1.20)

## 2015-08-24 NOTE — Progress Notes (Signed)
Hematology and Oncology Follow Up Visit  [name] [id number] November 23, 1950 64 y.o. 08/24/2015   Principle Diagnosis:  1. Locally recurrent adenocarcinoma of the right breast 2. History of superficial venous thrombus of the right thigh  Current Therapy:  1. Aromasin 25 mg p.o. daily 2. Aspirin 162 mg p.o. daily    Interim History:  [name] is here today for her 6 month follow-up. She is doing well but has had some issues with lymphedema in her right arm. Her arms is swollen today. She does wear a compression sleeve at times which does help some. She would like to go to the lymphedema clinic at [facility]. We can certainly refer her there.  She had cellulitis in the right arm a few months ago and was treated with antibiotics, This has since resolved and hasn't recurred.  She denies fever, chills, n/v, cough, rash, dizziness, SOB, chest pain, palpitations, abdominal pain or changes in her bowel or bladder habits.  She had a colonoscopy last month, She had 1 poly removed that was benign. There was no evidence of malignancy. She has no other swelling or tenderness in her extremities. No numbness or tingling.  She has a good appetite and is staying hydrated. Her weight is stable. She has gained 12 more lbs since her last visit but she is happy with this.    Medications:    Medication List       This list is accurate as of: 08/24/15  9:41 AM.  Always use your most recent med list.               exemestane 25 MG tablet  Commonly known as:  AROMASIN  TAKE 1 TABLET BY MOUTH EVERY DAY     hydrochlorothiazide 25 MG tablet  Commonly known as:  HYDRODIURIL  Take 25 mg by mouth daily.     ibuprofen 200 MG tablet  Commonly known as:  ADVIL,MOTRIN  Take 200 mg by mouth every 6 (six) hours as needed.     nabumetone 500 MG tablet  Commonly known as:  RELAFEN  Take 500 mg by mouth as needed.     omeprazole 20 MG capsule  Commonly known as:  PRILOSEC  Take 20 mg by mouth  daily.     oxyCODONE 5 MG immediate release tablet  Commonly known as:  Oxy IR/ROXICODONE  Take 5 mg by mouth as needed.     simvastatin 20 MG tablet  Commonly known as:  ZOCOR  Take 20 mg by mouth daily.     Telmisartan-Amlodipine 80-5 MG Tabs  Take 1 tablet by mouth daily.     Vitamin D (Ergocalciferol) 50000 UNITS Caps capsule  Commonly known as:  DRISDOL  TAKE ONE CAPSULE BY MOUTH ONCE A WEEK        Allergies:  Allergies  Allergen Reactions  . Dye Fdc Blue [Brilliant Blue Fcf (Fd&C Blue #1)]     Gives pt hives and causes nausea  CT scan dye    Past Medical History, Surgical history, Social history, and Family History were reviewed and updated.  Review of Systems: All other 10 point review of systems is negative.   Physical Exam:  weight is 178 lb (80.74 kg). Her oral temperature is 97.8 F (36.6 C). Her blood pressure is 156/71 and her pulse is 71.   Wt Readings from Last 3 Encounters:  08/24/15 178 lb (80.74 kg)  02/21/15 162 lb (73.483 kg)  08/07/14 142 lb (64.411 kg)  Ocular: Sclerae unicteric, pupils equal, round and reactive to light Ear-nose-throat: Oropharynx clear, dentition fair Lymphatic: No cervical or supraclavicular adenopathy Lungs no rales or rhonchi, good excursion bilaterally Heart regular rate and rhythm, no murmur appreciated Abd soft, nontender, positive bowel sounds MSK no focal spinal tenderness, no joint edema Neuro: non-focal, well-oriented, appropriate affect Breasts: No changes with the left breast. She has surgical changes to the right chest and hyperpigmentation from radiation. No mass, lesion, rash or lymphadenopathy found on exam.   Lab Results  Component Value Date   WBC 5.1 08/24/2015   HGB 11.3* 08/24/2015   HCT 35.7 08/24/2015   MCV 93 08/24/2015   PLT 209 08/24/2015   Lab Results  Component Value Date   FERRITIN 171 08/07/2014   IRON 36* 08/07/2014   TIBC 254 08/07/2014   UIBC 218 08/07/2014   IRONPCTSAT 14*  08/07/2014   Lab Results  Component Value Date   RETICCTPCT 1.4 06/20/2011   RBC 3.84 08/24/2015   RETICCTABS 55.2 06/20/2011   No results found for: KPAFRELGTCHN, LAMBDASER, KAPLAMBRATIO No results found for: Osborne Casco Lab Results  Component Value Date   TOTALPROTELP 6.6 08/14/2010   ALBUMINELP 53.8* 08/14/2010   A1GS 5.3* 08/14/2010   A2GS 14.3* 08/14/2010   BETS 5.8 08/14/2010   BETA2SER 6.3 08/14/2010   GAMS 14.5 08/14/2010   MSPIKE NOT DET 08/14/2010   SPEI * 08/14/2010     Chemistry      Component Value Date/Time   NA 147* 02/21/2015 0916   NA 148* 08/07/2014 0922   NA 144 06/02/2013 0857   K 3.7 02/21/2015 0916   K 3.5 08/07/2014 0922   K 3.7 06/02/2013 0857   CL 106 08/07/2014 0922   CL 108 06/02/2013 0857   CO2 31* 02/21/2015 0916   CO2 28 08/07/2014 0922   CO2 26 06/02/2013 0857   BUN 18.8 02/21/2015 0916   BUN 16 08/07/2014 0922   BUN 15 06/02/2013 0857   CREATININE 0.7 02/21/2015 0916   CREATININE 0.7 08/07/2014 0922   CREATININE 0.60 06/02/2013 0857      Component Value Date/Time   CALCIUM 9.1 02/21/2015 0916   CALCIUM 9.0 08/07/2014 0922   CALCIUM 9.1 06/02/2013 0857   ALKPHOS 69 02/21/2015 0916   ALKPHOS 51 08/07/2014 0922   ALKPHOS 64 06/02/2013 0857   AST 18 02/21/2015 0916   AST 21 08/07/2014 0922   AST 13 06/02/2013 0857   ALT 16 02/21/2015 0916   ALT 17 08/07/2014 0922   ALT 13 06/02/2013 0857   BILITOT 0.49 02/21/2015 0916   BILITOT 0.70 08/07/2014 0922   BILITOT 0.5 06/02/2013 0857     Impression and Plan: [name] 64 yo white female with history of locally recurrent adenocarcinoma of the right breast. She had a breast resection and radiation 5 years ago. She is now on Aromasin and is doing well. So far, there has been no evidence of recurrence.  She is having lymphedema in her right arm and recently had a bout with cellulitis in that arm that resolved with antibiotics. I feel that she would benefit from going to  the lymphedema clinic near her home. We will refer her. She is wearing her compression sleeve at times.  We will plan to see her back in 6 months for labs and follow-up.  She will contact us with any questions or concerns. We can certainly see her sooner if need be.   Eliezer Bottom, NP 11/11/20169:41 AM

## 2015-08-25 LAB — VITAMIN D 25 HYDROXY (VIT D DEFICIENCY, FRACTURES): VIT D 25 HYDROXY: 35 ng/mL (ref 30–100)

## 2015-10-03 DIAGNOSIS — I890 Lymphedema, not elsewhere classified: Secondary | ICD-10-CM | POA: Diagnosis not present

## 2015-10-03 DIAGNOSIS — M25611 Stiffness of right shoulder, not elsewhere classified: Secondary | ICD-10-CM | POA: Diagnosis not present

## 2015-10-03 DIAGNOSIS — C50911 Malignant neoplasm of unspecified site of right female breast: Secondary | ICD-10-CM | POA: Diagnosis not present

## 2015-10-03 DIAGNOSIS — L905 Scar conditions and fibrosis of skin: Secondary | ICD-10-CM | POA: Diagnosis not present

## 2015-10-17 DIAGNOSIS — L905 Scar conditions and fibrosis of skin: Secondary | ICD-10-CM | POA: Diagnosis not present

## 2015-10-17 DIAGNOSIS — I890 Lymphedema, not elsewhere classified: Secondary | ICD-10-CM | POA: Diagnosis not present

## 2015-10-17 DIAGNOSIS — C50911 Malignant neoplasm of unspecified site of right female breast: Secondary | ICD-10-CM | POA: Diagnosis not present

## 2015-10-17 DIAGNOSIS — M25611 Stiffness of right shoulder, not elsewhere classified: Secondary | ICD-10-CM | POA: Diagnosis not present

## 2015-10-19 DIAGNOSIS — L905 Scar conditions and fibrosis of skin: Secondary | ICD-10-CM | POA: Diagnosis not present

## 2015-10-19 DIAGNOSIS — M25611 Stiffness of right shoulder, not elsewhere classified: Secondary | ICD-10-CM | POA: Diagnosis not present

## 2015-10-19 DIAGNOSIS — C50911 Malignant neoplasm of unspecified site of right female breast: Secondary | ICD-10-CM | POA: Diagnosis not present

## 2015-10-19 DIAGNOSIS — I890 Lymphedema, not elsewhere classified: Secondary | ICD-10-CM | POA: Diagnosis not present

## 2015-10-24 DIAGNOSIS — M25611 Stiffness of right shoulder, not elsewhere classified: Secondary | ICD-10-CM | POA: Diagnosis not present

## 2015-10-24 DIAGNOSIS — L905 Scar conditions and fibrosis of skin: Secondary | ICD-10-CM | POA: Diagnosis not present

## 2015-10-24 DIAGNOSIS — C50911 Malignant neoplasm of unspecified site of right female breast: Secondary | ICD-10-CM | POA: Diagnosis not present

## 2015-10-24 DIAGNOSIS — I890 Lymphedema, not elsewhere classified: Secondary | ICD-10-CM | POA: Diagnosis not present

## 2015-10-26 DIAGNOSIS — C50911 Malignant neoplasm of unspecified site of right female breast: Secondary | ICD-10-CM | POA: Diagnosis not present

## 2015-10-26 DIAGNOSIS — L905 Scar conditions and fibrosis of skin: Secondary | ICD-10-CM | POA: Diagnosis not present

## 2015-10-26 DIAGNOSIS — M25611 Stiffness of right shoulder, not elsewhere classified: Secondary | ICD-10-CM | POA: Diagnosis not present

## 2015-10-26 DIAGNOSIS — I890 Lymphedema, not elsewhere classified: Secondary | ICD-10-CM | POA: Diagnosis not present

## 2015-10-30 DIAGNOSIS — I890 Lymphedema, not elsewhere classified: Secondary | ICD-10-CM | POA: Diagnosis not present

## 2015-10-30 DIAGNOSIS — M25611 Stiffness of right shoulder, not elsewhere classified: Secondary | ICD-10-CM | POA: Diagnosis not present

## 2015-10-30 DIAGNOSIS — C50911 Malignant neoplasm of unspecified site of right female breast: Secondary | ICD-10-CM | POA: Diagnosis not present

## 2015-10-30 DIAGNOSIS — L905 Scar conditions and fibrosis of skin: Secondary | ICD-10-CM | POA: Diagnosis not present

## 2015-11-01 DIAGNOSIS — M25611 Stiffness of right shoulder, not elsewhere classified: Secondary | ICD-10-CM | POA: Diagnosis not present

## 2015-11-01 DIAGNOSIS — L905 Scar conditions and fibrosis of skin: Secondary | ICD-10-CM | POA: Diagnosis not present

## 2015-11-01 DIAGNOSIS — I890 Lymphedema, not elsewhere classified: Secondary | ICD-10-CM | POA: Diagnosis not present

## 2015-11-01 DIAGNOSIS — C50911 Malignant neoplasm of unspecified site of right female breast: Secondary | ICD-10-CM | POA: Diagnosis not present

## 2015-12-20 DIAGNOSIS — Z124 Encounter for screening for malignant neoplasm of cervix: Secondary | ICD-10-CM | POA: Diagnosis not present

## 2015-12-20 DIAGNOSIS — Z6833 Body mass index (BMI) 33.0-33.9, adult: Secondary | ICD-10-CM | POA: Diagnosis not present

## 2015-12-27 DIAGNOSIS — R011 Cardiac murmur, unspecified: Secondary | ICD-10-CM | POA: Diagnosis not present

## 2016-01-17 DIAGNOSIS — I10 Essential (primary) hypertension: Secondary | ICD-10-CM | POA: Diagnosis not present

## 2016-01-17 DIAGNOSIS — Z6834 Body mass index (BMI) 34.0-34.9, adult: Secondary | ICD-10-CM | POA: Diagnosis not present

## 2016-01-17 DIAGNOSIS — E782 Mixed hyperlipidemia: Secondary | ICD-10-CM | POA: Diagnosis not present

## 2016-01-17 DIAGNOSIS — M7071 Other bursitis of hip, right hip: Secondary | ICD-10-CM | POA: Diagnosis not present

## 2016-02-21 ENCOUNTER — Ambulatory Visit (HOSPITAL_BASED_OUTPATIENT_CLINIC_OR_DEPARTMENT_OTHER): Payer: Medicare Other | Admitting: Hematology & Oncology

## 2016-02-21 ENCOUNTER — Other Ambulatory Visit (HOSPITAL_BASED_OUTPATIENT_CLINIC_OR_DEPARTMENT_OTHER): Payer: Medicare Other

## 2016-02-21 ENCOUNTER — Encounter: Payer: Self-pay | Admitting: Hematology & Oncology

## 2016-02-21 VITALS — BP 150/61 | HR 78 | Temp 97.4°F | Resp 16 | Ht 63.0 in | Wt 195.0 lb

## 2016-02-21 DIAGNOSIS — C50121 Malignant neoplasm of central portion of right male breast: Secondary | ICD-10-CM

## 2016-02-21 DIAGNOSIS — R600 Localized edema: Secondary | ICD-10-CM | POA: Diagnosis not present

## 2016-02-21 DIAGNOSIS — C50911 Malignant neoplasm of unspecified site of right female breast: Secondary | ICD-10-CM | POA: Diagnosis not present

## 2016-02-21 DIAGNOSIS — R634 Abnormal weight loss: Secondary | ICD-10-CM

## 2016-02-21 LAB — CBC WITH DIFFERENTIAL (CANCER CENTER ONLY)
BASO#: 0 10*3/uL (ref 0.0–0.2)
BASO%: 0.5 % (ref 0.0–2.0)
EOS ABS: 0.2 10*3/uL (ref 0.0–0.5)
EOS%: 3.6 % (ref 0.0–7.0)
HCT: 35.6 % (ref 34.8–46.6)
HEMOGLOBIN: 11.5 g/dL — AB (ref 11.6–15.9)
LYMPH#: 1.7 10*3/uL (ref 0.9–3.3)
LYMPH%: 29.4 % (ref 14.0–48.0)
MCH: 29.6 pg (ref 26.0–34.0)
MCHC: 32.3 g/dL (ref 32.0–36.0)
MCV: 92 fL (ref 81–101)
MONO#: 0.5 10*3/uL (ref 0.1–0.9)
MONO%: 8.1 % (ref 0.0–13.0)
NEUT%: 58.4 % (ref 39.6–80.0)
NEUTROS ABS: 3.4 10*3/uL (ref 1.5–6.5)
PLATELETS: 216 10*3/uL (ref 145–400)
RBC: 3.88 10*6/uL (ref 3.70–5.32)
RDW: 13.1 % (ref 11.1–15.7)
WBC: 5.8 10*3/uL (ref 3.9–10.0)

## 2016-02-21 LAB — COMPREHENSIVE METABOLIC PANEL
ALBUMIN: 3.5 g/dL (ref 3.5–5.0)
ALK PHOS: 76 U/L (ref 40–150)
ALT: 15 U/L (ref 0–55)
ANION GAP: 7 meq/L (ref 3–11)
AST: 15 U/L (ref 5–34)
BUN: 17.7 mg/dL (ref 7.0–26.0)
CO2: 28 mEq/L (ref 22–29)
CREATININE: 0.8 mg/dL (ref 0.6–1.1)
Calcium: 9.2 mg/dL (ref 8.4–10.4)
Chloride: 109 mEq/L (ref 98–109)
EGFR: 80 mL/min/{1.73_m2} — ABNORMAL LOW (ref >90)
Glucose: 117 mg/dl (ref 70–140)
POTASSIUM: 4 meq/L (ref 3.5–5.1)
SODIUM: 144 meq/L (ref 136–145)
TOTAL PROTEIN: 7.1 g/dL (ref 6.4–8.3)
Total Bilirubin: 0.48 mg/dL (ref 0.20–1.20)

## 2016-02-21 NOTE — Progress Notes (Signed)
Hematology and Oncology Follow Up Visit  Adri Melchor [id number] 06-30-1951 65 y.o. 02/21/2016   Principle Diagnosis:  Locally recurrent adenocarcinoma of the right breast. 2. History of superficial venous thrombus of the right thigh.   Current Therapy:   1. Aromasin 25 mg p.o. daily. 2. Aspirin 162 mg p.o. daily.     Interim History:  Ms.  [name] is back for followup. She is now retired. She is doing well with retirement. Unfortunately, her weight is going up. She's having more problems with her right hip. She probably needs to be seen by her orthopedist.  She is taking her vitamin D.  She has had no problems with cough or shortness of breath. She's had no change in bowel or bladder habits.  Her appetite is pretty vigorous. Ultimately, she will need to cut back on eating.  She's had no bleeding.  She's had no leg swelling. 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 She's had no problems with cellulitis.  Overall, her performance status is ECOG 1   Medications:  Current outpatient prescriptions:  .  exemestane (AROMASIN) 25 MG tablet, TAKE 1 TABLET BY MOUTH EVERY DAY, Disp: 30 tablet, Rfl: 11 .  hydrochlorothiazide 25 MG tablet, Take 25 mg by mouth daily. , Disp: , Rfl:  .  ibuprofen (ADVIL,MOTRIN) 200 MG tablet, Take 200 mg by mouth every 6 (six) hours as needed., Disp: , Rfl:  .  nabumetone (RELAFEN) 500 MG tablet, Take 500 mg by mouth as needed., Disp: , Rfl: 1 .  omeprazole (PRILOSEC) 20 MG capsule, Take 20 mg by mouth daily., Disp: , Rfl: 0 .  simvastatin (ZOCOR) 20 MG tablet, Take 20 mg by mouth daily., Disp: , Rfl: 4 .  Telmisartan-Amlodipine 80-5 MG TABS, Take 1 tablet by mouth daily., Disp: , Rfl: 3 .  Vitamin D, Ergocalciferol, (DRISDOL) 50000 UNITS CAPS capsule, TAKE ONE CAPSULE BY MOUTH ONCE A WEEK, Disp: 16 capsule, Rfl: 2  Allergies:  Allergies  Allergen Reactions  . Dye Fdc Blue [Brilliant Blue Fcf (Fd&C Blue #1)]     Gives pt hives and causes  nausea  CT scan dye    Past Medical History, Surgical history, Social history, and Family History were reviewed and updated.  Review of Systems: As above  Physical Exam:  height is 5\' 3"  (1.6 m) and weight is 195 lb (88.451 kg). Her oral temperature is 97.4 F (36.3 C). Her blood pressure is 150/61 and her pulse is 78. Her respiration is 16.   [name] and well-nourished white female. Lungs are clear. Cardiac exam regular rate and rhythm with no murmurs rubs or bruits. Abdomen is soft. Has good bowel sounds. There is no fluid wave. There is no palpable abdominal mass. There is a palpable hepatosplenomegaly. Breasts exam shows the left breast with no masses edema or erythema. There is no left axillary adenopathy. Right chest wall shows radiation telangiectasias. These are chronic. Right is without nodules. There is no right axillary adenopathy. Back exam no tenderness over the spine ribs or hips. Extremities shows some lymphedema of the right arm but this is improved. Has good range of motion of her joints.chest wall  Lab Results  Component Value Date   WBC 5.8 02/21/2016   HGB 11.5* 02/21/2016   HCT 35.6 02/21/2016   MCV 92 02/21/2016   PLT 216 02/21/2016     Chemistry      Component Value Date/Time   NA 142 08/24/2015 0905   NA 148*  08/07/2014 0922   NA 144 06/02/2013 0857   K 4.1 08/24/2015 0905   K 3.5 08/07/2014 0922   K 3.7 06/02/2013 0857   CL 106 08/07/2014 0922   CL 108 06/02/2013 0857   CO2 27 08/24/2015 0905   CO2 28 08/07/2014 0922   CO2 26 06/02/2013 0857   BUN 19.8 08/24/2015 0905   BUN 16 08/07/2014 0922   BUN 15 06/02/2013 0857   CREATININE 0.7 08/24/2015 0905   CREATININE 0.7 08/07/2014 0922   CREATININE 0.60 06/02/2013 0857      Component Value Date/Time   CALCIUM 9.4 08/24/2015 0905   CALCIUM 9.0 08/07/2014 0922   CALCIUM 9.1 06/02/2013 0857   ALKPHOS 74 08/24/2015 0905   ALKPHOS 51 08/07/2014 0922   ALKPHOS 64 06/02/2013 0857   AST 20  08/24/2015 0905   AST 21 08/07/2014 0922   AST 13 06/02/2013 0857   ALT 24 08/24/2015 0905   ALT 17 08/07/2014 0922   ALT 13 06/02/2013 0857   BILITOT 0.43 08/24/2015 0905   BILITOT 0.70 08/07/2014 0922   BILITOT 0.5 06/02/2013 0857         Impression and Plan: Ms. Rutherford is  65 year old white female. She has a history of locally recurrent adenocarcinoma of the right breast. She now is out from resection and radiation by 5 years. She is on Aromasin. She's done well with Aromasin. I don't see any evidence of recurrence.  Her weight is clearly too high. Her right hip isn't bothering her. She really needs to have this looked at. She does have a with pedis then as he did her left hip surgery several years ago.  We'll plan to get her back in 6 months. Ultimately, her weight will be down in 6 months. Maybe, she would've had her right hip repaired.   [name], MD 5/11/201710:02 AM

## 2016-02-22 LAB — VITAMIN D 25 HYDROXY (VIT D DEFICIENCY, FRACTURES): Vitamin D, 25-Hydroxy: 45.5 ng/mL (ref 30.0–100.0)

## 2016-02-26 DIAGNOSIS — M1611 Unilateral primary osteoarthritis, right hip: Secondary | ICD-10-CM | POA: Diagnosis not present

## 2016-02-26 DIAGNOSIS — M545 Low back pain: Secondary | ICD-10-CM | POA: Diagnosis not present

## 2016-03-03 DIAGNOSIS — M1611 Unilateral primary osteoarthritis, right hip: Secondary | ICD-10-CM | POA: Diagnosis not present

## 2016-03-17 DIAGNOSIS — Z853 Personal history of malignant neoplasm of breast: Secondary | ICD-10-CM | POA: Diagnosis not present

## 2016-03-18 ENCOUNTER — Encounter: Payer: Self-pay | Admitting: *Deleted

## 2016-03-21 ENCOUNTER — Other Ambulatory Visit: Payer: Self-pay | Admitting: Hematology & Oncology

## 2016-04-08 ENCOUNTER — Other Ambulatory Visit: Payer: Self-pay | Admitting: Hematology & Oncology

## 2016-04-08 DIAGNOSIS — Z9011 Acquired absence of right breast and nipple: Secondary | ICD-10-CM

## 2016-04-08 DIAGNOSIS — Z1231 Encounter for screening mammogram for malignant neoplasm of breast: Secondary | ICD-10-CM

## 2016-04-30 ENCOUNTER — Ambulatory Visit
Admission: RE | Admit: 2016-04-30 | Discharge: 2016-04-30 | Disposition: A | Discharge status: Home / Self Care | Payer: Medicare Other | Source: Ambulatory Visit | Attending: Hematology & Oncology | Admitting: Hematology & Oncology

## 2016-04-30 DIAGNOSIS — Z9011 Acquired absence of right breast and nipple: Secondary | ICD-10-CM

## 2016-04-30 DIAGNOSIS — Z1231 Encounter for screening mammogram for malignant neoplasm of breast: Secondary | ICD-10-CM

## 2016-07-07 ENCOUNTER — Other Ambulatory Visit: Payer: Self-pay | Admitting: Hematology & Oncology

## 2016-08-05 DIAGNOSIS — E782 Mixed hyperlipidemia: Secondary | ICD-10-CM | POA: Diagnosis not present

## 2016-08-05 DIAGNOSIS — I10 Essential (primary) hypertension: Secondary | ICD-10-CM | POA: Diagnosis not present

## 2016-08-12 DIAGNOSIS — I10 Essential (primary) hypertension: Secondary | ICD-10-CM | POA: Diagnosis not present

## 2016-08-12 DIAGNOSIS — Z23 Encounter for immunization: Secondary | ICD-10-CM | POA: Diagnosis not present

## 2016-08-12 DIAGNOSIS — E782 Mixed hyperlipidemia: Secondary | ICD-10-CM | POA: Diagnosis not present

## 2016-08-12 DIAGNOSIS — K219 Gastro-esophageal reflux disease without esophagitis: Secondary | ICD-10-CM | POA: Diagnosis not present

## 2016-08-21 ENCOUNTER — Ambulatory Visit (HOSPITAL_BASED_OUTPATIENT_CLINIC_OR_DEPARTMENT_OTHER): Payer: Medicare Other | Admitting: Family

## 2016-08-21 ENCOUNTER — Other Ambulatory Visit (HOSPITAL_BASED_OUTPATIENT_CLINIC_OR_DEPARTMENT_OTHER): Payer: Medicare Other

## 2016-08-21 VITALS — BP 154/65 | HR 79 | Temp 98.3°F | Resp 18 | Ht 63.0 in | Wt 197.8 lb

## 2016-08-21 DIAGNOSIS — Z7901 Long term (current) use of anticoagulants: Secondary | ICD-10-CM

## 2016-08-21 DIAGNOSIS — C50121 Malignant neoplasm of central portion of right male breast: Secondary | ICD-10-CM

## 2016-08-21 DIAGNOSIS — C50911 Malignant neoplasm of unspecified site of right female breast: Secondary | ICD-10-CM | POA: Diagnosis not present

## 2016-08-21 DIAGNOSIS — Z170 Estrogen receptor positive status [ER+]: Secondary | ICD-10-CM

## 2016-08-21 DIAGNOSIS — I890 Lymphedema, not elsewhere classified: Secondary | ICD-10-CM

## 2016-08-21 DIAGNOSIS — Z86718 Personal history of other venous thrombosis and embolism: Secondary | ICD-10-CM | POA: Diagnosis not present

## 2016-08-21 LAB — CBC WITH DIFFERENTIAL (CANCER CENTER ONLY)
BASO#: 0 10*3/uL (ref 0.0–0.2)
BASO%: 0.5 % (ref 0.0–2.0)
EOS ABS: 0.2 10*3/uL (ref 0.0–0.5)
EOS%: 2.7 % (ref 0.0–7.0)
HCT: 34.9 % (ref 34.8–46.6)
HGB: 11.3 g/dL — ABNORMAL LOW (ref 11.6–15.9)
LYMPH#: 2 10*3/uL (ref 0.9–3.3)
LYMPH%: 32.3 % (ref 14.0–48.0)
MCH: 29.1 pg (ref 26.0–34.0)
MCHC: 32.4 g/dL (ref 32.0–36.0)
MCV: 90 fL (ref 81–101)
MONO#: 0.5 10*3/uL (ref 0.1–0.9)
MONO%: 7.8 % (ref 0.0–13.0)
NEUT#: 3.5 10*3/uL (ref 1.5–6.5)
NEUT%: 56.7 % (ref 39.6–80.0)
PLATELETS: 222 10*3/uL (ref 145–400)
RBC: 3.88 10*6/uL (ref 3.70–5.32)
RDW: 13.8 % (ref 11.1–15.7)
WBC: 6.2 10*3/uL (ref 3.9–10.0)

## 2016-08-21 LAB — COMPREHENSIVE METABOLIC PANEL
ALT: 21 U/L (ref 0–55)
ANION GAP: 9 meq/L (ref 3–11)
AST: 21 U/L (ref 5–34)
Albumin: 3.4 g/dL — ABNORMAL LOW (ref 3.5–5.0)
Alkaline Phosphatase: 82 U/L (ref 40–150)
BILIRUBIN TOTAL: 0.37 mg/dL (ref 0.20–1.20)
BUN: 19.3 mg/dL (ref 7.0–26.0)
CO2: 28 meq/L (ref 22–29)
Calcium: 9.3 mg/dL (ref 8.4–10.4)
Chloride: 108 mEq/L (ref 98–109)
Creatinine: 0.7 mg/dL (ref 0.6–1.1)
EGFR: 90 mL/min/{1.73_m2} — AB (ref >90)
GLUCOSE: 95 mg/dL (ref 70–140)
POTASSIUM: 4 meq/L (ref 3.5–5.1)
SODIUM: 145 meq/L (ref 136–145)
TOTAL PROTEIN: 7.1 g/dL (ref 6.4–8.3)

## 2016-08-21 NOTE — Progress Notes (Signed)
Hematology and Oncology Follow Up Visit  [name] [id number] [birth year] 65 y.o. 08/21/2016   Principle Diagnosis:  1. Locally recurrent adenocarcinoma of the right breast 2. History of superficial venous thrombus of the right thigh  Current Therapy:  1. Aromasin 25 mg PO daily 2. Aspirin 81 mg PO Daily 3. Vitamin D 50,000 units PO daily    Interim History:  [name] is here today for her 6 month follow-up. She is doing well but misses working. She gets bored at home and plans to find something to help her stay busy. She is enjoying spending time with family and is expecting another great granddaughter in January.  Her breast exam today was negative. 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 She has muscle spasms across the right chest from time to time and will just "breathe through them." She states that these are not painful.  Her left breast mammogram in July was negative.  She was able to go to the lymphedema clinic and learned some techniques to reduce her right arm swelling. She wears her compression sleeve as needed.  She has no other swelling or tenderness in her extremities. No numbness or tingling.  She verbalized that she is taking her Aromasin 25 mg PO once a day.  No fever, chills, n/v, cough, rash, dizziness, SOB, chest pain, palpitations, abdominal pain or changes in her bowel or bladder habits.  She is still having right hip pain and states that injections have not helped. She is not ready to have her hip replaced at this time. She states that she is unsure if she wants to go back to the same surgeon that did her left hip.  She has maintained a good appetite and is staying well hydrated. Her weight is stable. She would like to lose some weight.   Medications:    Medication List       Accurate as of 08/21/16 10:30 AM. Always use your most recent med list.          exemestane 25 MG tablet Commonly known as:  AROMASIN TAKE 1  TABLET BY MOUTH EVERY DAY   hydrochlorothiazide 25 MG tablet Commonly known as:  HYDRODIURIL Take 25 mg by mouth daily.   ibuprofen 200 MG tablet Commonly known as:  ADVIL,MOTRIN Take 200 mg by mouth every 6 (six) hours as needed.   nabumetone 500 MG tablet Commonly known as:  RELAFEN Take 500 mg by mouth as needed.   omeprazole 20 MG capsule Commonly known as:  PRILOSEC Take 20 mg by mouth daily.   simvastatin 20 MG tablet Commonly known as:  ZOCOR Take 20 mg by mouth daily.   Telmisartan-Amlodipine 80-5 MG Tabs Take 1 tablet by mouth daily.   Vitamin D (Ergocalciferol) 50000 units Caps capsule Commonly known as:  DRISDOL TAKE ONE CAPSULE BY MOUTH ONCE A WEEK       Allergies:  Allergies  Allergen Reactions  . Dye Fdc Blue [Brilliant Blue Fcf (Fd&C Blue #1)]     Gives pt hives and causes nausea  CT scan dye    Past Medical History, Surgical history, Social history, and Family History were reviewed and updated.  Review of Systems: All other 10 point review of systems is negative.   Physical Exam:  vitals were not taken for this visit.  Wt Readings from Last 3 Encounters:  02/21/16 195 lb (88.5 kg)  08/24/15 178 lb (80.7 kg)  02/21/15 162 lb (73.5 kg)  Ocular: Sclerae unicteric, pupils equal, round and reactive to light Ear-nose-throat: Oropharynx clear, dentition fair Lymphatic: No cervical supraclavicular or axillary adenopathy Lungs no rales or rhonchi, good excursion bilaterally Heart regular rate and rhythm, no murmur appreciated Abd soft, nontender, positive bowel sounds, no liver or spleen tip palpated on exam, no fluid wave MSK no focal spinal tenderness, no joint edema Neuro: non-focal, well-oriented, appropriate affect Breasts: No changes with the left breast. Right mastectomy with telangiectasia from radiation. No mass, lesion, rash or lymphadenopathy found on exam.   Lab Results  Component Value Date   WBC 6.2 08/21/2016   HGB 11.3 (L)  08/21/2016   HCT 34.9 08/21/2016   MCV 90 08/21/2016   PLT 222 08/21/2016   Lab Results  Component Value Date   FERRITIN 171 08/07/2014   IRON 36 (L) 08/07/2014   TIBC 254 08/07/2014   UIBC 218 08/07/2014   IRONPCTSAT 14 (L) 08/07/2014   Lab Results  Component Value Date   RETICCTPCT 1.4 06/20/2011   RBC 3.88 08/21/2016   RETICCTABS 55.2 06/20/2011   No results found for: KPAFRELGTCHN, LAMBDASER, KAPLAMBRATIO No results found for: Osborne Casco Lab Results  Component Value Date   TOTALPROTELP 6.6 08/14/2010   ALBUMINELP 53.8 (L) 08/14/2010   A1GS 5.3 (H) 08/14/2010   A2GS 14.3 (H) 08/14/2010   BETS 5.8 08/14/2010   BETA2SER 6.3 08/14/2010   GAMS 14.5 08/14/2010   MSPIKE NOT DET 08/14/2010   SPEI * 08/14/2010     Chemistry      Component Value Date/Time   NA 144 02/21/2016 0834   K 4.0 02/21/2016 0834   CL 106 08/07/2014 0922   CO2 28 02/21/2016 0834   BUN 17.7 02/21/2016 0834   CREATININE 0.8 02/21/2016 0834      Component Value Date/Time   CALCIUM 9.2 02/21/2016 0834   ALKPHOS 76 02/21/2016 0834   AST 15 02/21/2016 0834   ALT 15 02/21/2016 0834   BILITOT 0.48 02/21/2016 0834     Impression and Plan: [name] isa 65 yo white female with history of locally recurrent adenocarcinoma of the right breast. She had a breast resection and radiation almost 6 years ago. She is now on Aromasin and doing well. So far, there has been no evidence of recurrence. She has no complaints at this time and breast exam was negative.   She will continue to also take her baby aspirin daily and Vitamin D once a week.  We will plan to see her back in 6 months for labs and follow-up.  She will contact us with any questions or concerns. We can certainly see her sooner if need be.   Eliezer Bottom, NP 11/9/201710:30 AM

## 2016-08-27 DIAGNOSIS — Z0000 Encounter for general adult medical examination without abnormal findings: Secondary | ICD-10-CM | POA: Diagnosis not present

## 2016-08-27 DIAGNOSIS — Z1389 Encounter for screening for other disorder: Secondary | ICD-10-CM | POA: Diagnosis not present

## 2016-08-27 DIAGNOSIS — Z139 Encounter for screening, unspecified: Secondary | ICD-10-CM | POA: Diagnosis not present

## 2016-11-19 DIAGNOSIS — Z6836 Body mass index (BMI) 36.0-36.9, adult: Secondary | ICD-10-CM | POA: Diagnosis not present

## 2016-11-19 DIAGNOSIS — M25551 Pain in right hip: Secondary | ICD-10-CM | POA: Diagnosis not present

## 2016-11-21 DIAGNOSIS — M1611 Unilateral primary osteoarthritis, right hip: Secondary | ICD-10-CM | POA: Diagnosis not present

## 2016-11-21 DIAGNOSIS — M1990 Unspecified osteoarthritis, unspecified site: Secondary | ICD-10-CM | POA: Diagnosis not present

## 2016-11-21 DIAGNOSIS — M25551 Pain in right hip: Secondary | ICD-10-CM | POA: Diagnosis not present

## 2016-12-03 DIAGNOSIS — Z6836 Body mass index (BMI) 36.0-36.9, adult: Secondary | ICD-10-CM | POA: Diagnosis not present

## 2016-12-03 DIAGNOSIS — M25551 Pain in right hip: Secondary | ICD-10-CM | POA: Diagnosis not present

## 2016-12-10 DIAGNOSIS — M25551 Pain in right hip: Secondary | ICD-10-CM | POA: Diagnosis not present

## 2016-12-10 DIAGNOSIS — R262 Difficulty in walking, not elsewhere classified: Secondary | ICD-10-CM | POA: Diagnosis not present

## 2016-12-10 DIAGNOSIS — M6281 Muscle weakness (generalized): Secondary | ICD-10-CM | POA: Diagnosis not present

## 2016-12-10 DIAGNOSIS — M25651 Stiffness of right hip, not elsewhere classified: Secondary | ICD-10-CM | POA: Diagnosis not present

## 2016-12-11 DIAGNOSIS — M25551 Pain in right hip: Secondary | ICD-10-CM | POA: Diagnosis not present

## 2016-12-11 DIAGNOSIS — M25651 Stiffness of right hip, not elsewhere classified: Secondary | ICD-10-CM | POA: Diagnosis not present

## 2016-12-11 DIAGNOSIS — M6281 Muscle weakness (generalized): Secondary | ICD-10-CM | POA: Diagnosis not present

## 2016-12-11 DIAGNOSIS — R262 Difficulty in walking, not elsewhere classified: Secondary | ICD-10-CM | POA: Diagnosis not present

## 2016-12-15 DIAGNOSIS — R262 Difficulty in walking, not elsewhere classified: Secondary | ICD-10-CM | POA: Diagnosis not present

## 2016-12-15 DIAGNOSIS — M25651 Stiffness of right hip, not elsewhere classified: Secondary | ICD-10-CM | POA: Diagnosis not present

## 2016-12-15 DIAGNOSIS — M6281 Muscle weakness (generalized): Secondary | ICD-10-CM | POA: Diagnosis not present

## 2016-12-15 DIAGNOSIS — M25551 Pain in right hip: Secondary | ICD-10-CM | POA: Diagnosis not present

## 2016-12-17 DIAGNOSIS — M25551 Pain in right hip: Secondary | ICD-10-CM | POA: Diagnosis not present

## 2016-12-17 DIAGNOSIS — M25651 Stiffness of right hip, not elsewhere classified: Secondary | ICD-10-CM | POA: Diagnosis not present

## 2016-12-17 DIAGNOSIS — M6281 Muscle weakness (generalized): Secondary | ICD-10-CM | POA: Diagnosis not present

## 2016-12-17 DIAGNOSIS — R262 Difficulty in walking, not elsewhere classified: Secondary | ICD-10-CM | POA: Diagnosis not present

## 2016-12-19 DIAGNOSIS — M25551 Pain in right hip: Secondary | ICD-10-CM | POA: Diagnosis not present

## 2016-12-19 DIAGNOSIS — M6281 Muscle weakness (generalized): Secondary | ICD-10-CM | POA: Diagnosis not present

## 2016-12-19 DIAGNOSIS — M25651 Stiffness of right hip, not elsewhere classified: Secondary | ICD-10-CM | POA: Diagnosis not present

## 2016-12-19 DIAGNOSIS — R262 Difficulty in walking, not elsewhere classified: Secondary | ICD-10-CM | POA: Diagnosis not present

## 2016-12-23 DIAGNOSIS — M6281 Muscle weakness (generalized): Secondary | ICD-10-CM | POA: Diagnosis not present

## 2016-12-23 DIAGNOSIS — M25651 Stiffness of right hip, not elsewhere classified: Secondary | ICD-10-CM | POA: Diagnosis not present

## 2016-12-23 DIAGNOSIS — R262 Difficulty in walking, not elsewhere classified: Secondary | ICD-10-CM | POA: Diagnosis not present

## 2016-12-23 DIAGNOSIS — M25551 Pain in right hip: Secondary | ICD-10-CM | POA: Diagnosis not present

## 2016-12-24 DIAGNOSIS — M25651 Stiffness of right hip, not elsewhere classified: Secondary | ICD-10-CM | POA: Diagnosis not present

## 2016-12-24 DIAGNOSIS — M25551 Pain in right hip: Secondary | ICD-10-CM | POA: Diagnosis not present

## 2016-12-24 DIAGNOSIS — R262 Difficulty in walking, not elsewhere classified: Secondary | ICD-10-CM | POA: Diagnosis not present

## 2016-12-24 DIAGNOSIS — M6281 Muscle weakness (generalized): Secondary | ICD-10-CM | POA: Diagnosis not present

## 2016-12-29 DIAGNOSIS — M6281 Muscle weakness (generalized): Secondary | ICD-10-CM | POA: Diagnosis not present

## 2016-12-29 DIAGNOSIS — R262 Difficulty in walking, not elsewhere classified: Secondary | ICD-10-CM | POA: Diagnosis not present

## 2016-12-29 DIAGNOSIS — M25651 Stiffness of right hip, not elsewhere classified: Secondary | ICD-10-CM | POA: Diagnosis not present

## 2016-12-29 DIAGNOSIS — M25551 Pain in right hip: Secondary | ICD-10-CM | POA: Diagnosis not present

## 2016-12-30 DIAGNOSIS — Z6834 Body mass index (BMI) 34.0-34.9, adult: Secondary | ICD-10-CM | POA: Diagnosis not present

## 2016-12-30 DIAGNOSIS — N3281 Overactive bladder: Secondary | ICD-10-CM | POA: Diagnosis not present

## 2016-12-30 DIAGNOSIS — Z01419 Encounter for gynecological examination (general) (routine) without abnormal findings: Secondary | ICD-10-CM | POA: Diagnosis not present

## 2016-12-30 DIAGNOSIS — Z780 Asymptomatic menopausal state: Secondary | ICD-10-CM | POA: Diagnosis not present

## 2016-12-31 DIAGNOSIS — M6281 Muscle weakness (generalized): Secondary | ICD-10-CM | POA: Diagnosis not present

## 2016-12-31 DIAGNOSIS — M25651 Stiffness of right hip, not elsewhere classified: Secondary | ICD-10-CM | POA: Diagnosis not present

## 2016-12-31 DIAGNOSIS — R262 Difficulty in walking, not elsewhere classified: Secondary | ICD-10-CM | POA: Diagnosis not present

## 2016-12-31 DIAGNOSIS — M25551 Pain in right hip: Secondary | ICD-10-CM | POA: Diagnosis not present

## 2017-01-01 DIAGNOSIS — E782 Mixed hyperlipidemia: Secondary | ICD-10-CM | POA: Diagnosis not present

## 2017-01-01 DIAGNOSIS — I10 Essential (primary) hypertension: Secondary | ICD-10-CM | POA: Diagnosis not present

## 2017-01-02 DIAGNOSIS — M6281 Muscle weakness (generalized): Secondary | ICD-10-CM | POA: Diagnosis not present

## 2017-01-02 DIAGNOSIS — M25651 Stiffness of right hip, not elsewhere classified: Secondary | ICD-10-CM | POA: Diagnosis not present

## 2017-01-02 DIAGNOSIS — M25551 Pain in right hip: Secondary | ICD-10-CM | POA: Diagnosis not present

## 2017-01-02 DIAGNOSIS — R262 Difficulty in walking, not elsewhere classified: Secondary | ICD-10-CM | POA: Diagnosis not present

## 2017-01-05 DIAGNOSIS — M25551 Pain in right hip: Secondary | ICD-10-CM | POA: Diagnosis not present

## 2017-01-05 DIAGNOSIS — R262 Difficulty in walking, not elsewhere classified: Secondary | ICD-10-CM | POA: Diagnosis not present

## 2017-01-05 DIAGNOSIS — M25651 Stiffness of right hip, not elsewhere classified: Secondary | ICD-10-CM | POA: Diagnosis not present

## 2017-01-05 DIAGNOSIS — M6281 Muscle weakness (generalized): Secondary | ICD-10-CM | POA: Diagnosis not present

## 2017-01-07 DIAGNOSIS — R262 Difficulty in walking, not elsewhere classified: Secondary | ICD-10-CM | POA: Diagnosis not present

## 2017-01-07 DIAGNOSIS — M6281 Muscle weakness (generalized): Secondary | ICD-10-CM | POA: Diagnosis not present

## 2017-01-07 DIAGNOSIS — M25651 Stiffness of right hip, not elsewhere classified: Secondary | ICD-10-CM | POA: Diagnosis not present

## 2017-01-07 DIAGNOSIS — M25551 Pain in right hip: Secondary | ICD-10-CM | POA: Diagnosis not present

## 2017-01-09 DIAGNOSIS — R262 Difficulty in walking, not elsewhere classified: Secondary | ICD-10-CM | POA: Diagnosis not present

## 2017-01-09 DIAGNOSIS — M6281 Muscle weakness (generalized): Secondary | ICD-10-CM | POA: Diagnosis not present

## 2017-01-09 DIAGNOSIS — M25651 Stiffness of right hip, not elsewhere classified: Secondary | ICD-10-CM | POA: Diagnosis not present

## 2017-01-09 DIAGNOSIS — M25551 Pain in right hip: Secondary | ICD-10-CM | POA: Diagnosis not present

## 2017-01-12 DIAGNOSIS — R262 Difficulty in walking, not elsewhere classified: Secondary | ICD-10-CM | POA: Diagnosis not present

## 2017-01-12 DIAGNOSIS — M25551 Pain in right hip: Secondary | ICD-10-CM | POA: Diagnosis not present

## 2017-01-12 DIAGNOSIS — M6281 Muscle weakness (generalized): Secondary | ICD-10-CM | POA: Diagnosis not present

## 2017-01-12 DIAGNOSIS — M25651 Stiffness of right hip, not elsewhere classified: Secondary | ICD-10-CM | POA: Diagnosis not present

## 2017-01-14 DIAGNOSIS — M25551 Pain in right hip: Secondary | ICD-10-CM | POA: Diagnosis not present

## 2017-01-14 DIAGNOSIS — M6281 Muscle weakness (generalized): Secondary | ICD-10-CM | POA: Diagnosis not present

## 2017-01-14 DIAGNOSIS — M25651 Stiffness of right hip, not elsewhere classified: Secondary | ICD-10-CM | POA: Diagnosis not present

## 2017-01-14 DIAGNOSIS — R262 Difficulty in walking, not elsewhere classified: Secondary | ICD-10-CM | POA: Diagnosis not present

## 2017-01-15 DIAGNOSIS — R7301 Impaired fasting glucose: Secondary | ICD-10-CM | POA: Diagnosis not present

## 2017-01-15 DIAGNOSIS — E782 Mixed hyperlipidemia: Secondary | ICD-10-CM | POA: Diagnosis not present

## 2017-01-15 DIAGNOSIS — I10 Essential (primary) hypertension: Secondary | ICD-10-CM | POA: Diagnosis not present

## 2017-01-15 DIAGNOSIS — K219 Gastro-esophageal reflux disease without esophagitis: Secondary | ICD-10-CM | POA: Diagnosis not present

## 2017-01-19 DIAGNOSIS — R262 Difficulty in walking, not elsewhere classified: Secondary | ICD-10-CM | POA: Diagnosis not present

## 2017-01-19 DIAGNOSIS — M25551 Pain in right hip: Secondary | ICD-10-CM | POA: Diagnosis not present

## 2017-01-19 DIAGNOSIS — M25651 Stiffness of right hip, not elsewhere classified: Secondary | ICD-10-CM | POA: Diagnosis not present

## 2017-01-19 DIAGNOSIS — M6281 Muscle weakness (generalized): Secondary | ICD-10-CM | POA: Diagnosis not present

## 2017-02-18 ENCOUNTER — Ambulatory Visit (HOSPITAL_BASED_OUTPATIENT_CLINIC_OR_DEPARTMENT_OTHER)
Admission: RE | Admit: 2017-02-18 | Discharge: 2017-02-18 | Disposition: A | Discharge status: Home / Self Care | Payer: Medicare Other | Source: Ambulatory Visit | Attending: Hematology & Oncology | Admitting: Hematology & Oncology

## 2017-02-18 ENCOUNTER — Ambulatory Visit (HOSPITAL_BASED_OUTPATIENT_CLINIC_OR_DEPARTMENT_OTHER): Payer: Medicare Other | Admitting: Hematology & Oncology

## 2017-02-18 ENCOUNTER — Other Ambulatory Visit (HOSPITAL_BASED_OUTPATIENT_CLINIC_OR_DEPARTMENT_OTHER): Payer: Medicare Other

## 2017-02-18 VITALS — BP 163/70 | HR 89 | Temp 98.2°F | Resp 16 | Wt 195.0 lb

## 2017-02-18 DIAGNOSIS — Z170 Estrogen receptor positive status [ER+]: Secondary | ICD-10-CM | POA: Diagnosis not present

## 2017-02-18 DIAGNOSIS — C50011 Malignant neoplasm of nipple and areola, right female breast: Secondary | ICD-10-CM | POA: Diagnosis not present

## 2017-02-18 DIAGNOSIS — C50911 Malignant neoplasm of unspecified site of right female breast: Secondary | ICD-10-CM

## 2017-02-18 DIAGNOSIS — I2510 Atherosclerotic heart disease of native coronary artery without angina pectoris: Secondary | ICD-10-CM | POA: Diagnosis not present

## 2017-02-18 DIAGNOSIS — D500 Iron deficiency anemia secondary to blood loss (chronic): Secondary | ICD-10-CM | POA: Diagnosis not present

## 2017-02-18 DIAGNOSIS — I700 Atherosclerosis of aorta: Secondary | ICD-10-CM | POA: Insufficient documentation

## 2017-02-18 DIAGNOSIS — Z9011 Acquired absence of right breast and nipple: Secondary | ICD-10-CM | POA: Diagnosis not present

## 2017-02-18 DIAGNOSIS — E559 Vitamin D deficiency, unspecified: Secondary | ICD-10-CM | POA: Diagnosis not present

## 2017-02-18 DIAGNOSIS — I890 Lymphedema, not elsewhere classified: Secondary | ICD-10-CM

## 2017-02-18 DIAGNOSIS — R918 Other nonspecific abnormal finding of lung field: Secondary | ICD-10-CM | POA: Insufficient documentation

## 2017-02-18 DIAGNOSIS — R0789 Other chest pain: Secondary | ICD-10-CM | POA: Insufficient documentation

## 2017-02-18 DIAGNOSIS — C50121 Malignant neoplasm of central portion of right male breast: Secondary | ICD-10-CM

## 2017-02-18 LAB — COMPREHENSIVE METABOLIC PANEL
ALT: 12 U/L (ref 0–55)
ANION GAP: 9 meq/L (ref 3–11)
AST: 13 U/L (ref 5–34)
Albumin: 3.6 g/dL (ref 3.5–5.0)
Alkaline Phosphatase: 78 U/L (ref 40–150)
BUN: 17.7 mg/dL (ref 7.0–26.0)
CO2: 27 meq/L (ref 22–29)
CREATININE: 0.7 mg/dL (ref 0.6–1.1)
Calcium: 9.3 mg/dL (ref 8.4–10.4)
Chloride: 107 mEq/L (ref 98–109)
EGFR: 83 mL/min/{1.73_m2} — ABNORMAL LOW (ref >90)
Glucose: 154 mg/dl — ABNORMAL HIGH (ref 70–140)
Potassium: 3.7 mEq/L (ref 3.5–5.1)
SODIUM: 144 meq/L (ref 136–145)
Total Bilirubin: 0.54 mg/dL (ref 0.20–1.20)
Total Protein: 7.2 g/dL (ref 6.4–8.3)

## 2017-02-18 LAB — CBC WITH DIFFERENTIAL (CANCER CENTER ONLY)
BASO#: 0 10*3/uL (ref 0.0–0.2)
BASO%: 0.2 % (ref 0.0–2.0)
EOS%: 4.7 % (ref 0.0–7.0)
Eosinophils Absolute: 0.4 10*3/uL (ref 0.0–0.5)
HCT: 34.4 % — ABNORMAL LOW (ref 34.8–46.6)
HGB: 11.1 g/dL — ABNORMAL LOW (ref 11.6–15.9)
LYMPH#: 1.8 10*3/uL (ref 0.9–3.3)
LYMPH%: 22.2 % (ref 14.0–48.0)
MCH: 29.4 pg (ref 26.0–34.0)
MCHC: 32.3 g/dL (ref 32.0–36.0)
MCV: 91 fL (ref 81–101)
MONO#: 0.6 10*3/uL (ref 0.1–0.9)
MONO%: 7.9 % (ref 0.0–13.0)
NEUT%: 65 % (ref 39.6–80.0)
NEUTROS ABS: 5.3 10*3/uL (ref 1.5–6.5)
PLATELETS: 189 10*3/uL (ref 145–400)
RBC: 3.78 10*6/uL (ref 3.70–5.32)
RDW: 13.6 % (ref 11.1–15.7)
WBC: 8.1 10*3/uL (ref 3.9–10.0)

## 2017-02-18 NOTE — Progress Notes (Signed)
Hematology and Oncology Follow Up Visit  [name] [id number] 1951/08/20 66 y.o. 02/18/2017   Principle Diagnosis:  Locally recurrent adenocarcinoma of the right breast. 2. History of superficial venous thrombus of the right thigh.   Current Therapy:   1. Aromasin 25 mg p.o. daily. 2. Aspirin 162 mg p.o. daily.     Interim History:  Ms.  [name] is back for followup. She's having some problems with the right anterior chest wall. This is where she had the mastectomy and radiation. She has radiation telangiectasias. She says that she just has more pain in this area. She feels there is a little more thickening.  We will go ahead and get a CT scan. This was done this morning. Everything looked okay. She has stable supple centimeter pulmonary nodules. These were noted back in 2004. The mastectomy site looks clear. There is no thickening or nodularity that would suggest recurrent disease.  I suspect that the pain might just be some scar tissue and a lot of tightness from the radiation and surgery that she had.  She's had no fever. She still retired. She is enjoying retirement.  She has a lymphedema in the right arm. This is stable.  She's had no change in bowel or bladder habits. She's had no rashes. She's had no leg swelling.  She's on Aromasin. She is doing well with Aromasin.  Overall, her performance status is ECOG 1   Medications:  Current Outpatient Prescriptions:  .  exemestane (AROMASIN) 25 MG tablet, TAKE 1 TABLET BY MOUTH EVERY DAY, Disp: 30 tablet, Rfl: 11 .  gabapentin (NEURONTIN) 300 MG capsule, Take 300 mg by mouth., Disp: , Rfl:  .  hydrochlorothiazide 25 MG tablet, Take 25 mg by mouth daily. , Disp: , Rfl:  .  ibuprofen (ADVIL,MOTRIN) 200 MG tablet, Take 200 mg by mouth every 6 (six) hours as needed., Disp: , Rfl:  .  nabumetone (RELAFEN) 500 MG tablet, Take 500 mg by mouth as needed., Disp: , Rfl: 1 .  omeprazole (PRILOSEC) 20 MG capsule, Take 20 mg by mouth daily.,  Disp: , Rfl: 0 .  predniSONE (DELTASONE) 10 MG tablet, Take 10 mg by mouth., Disp: , Rfl:  .  simvastatin (ZOCOR) 20 MG tablet, Take 20 mg by mouth daily., Disp: , Rfl: 4 .  Telmisartan-Amlodipine 80-5 MG TABS, Take 1 tablet by mouth daily., Disp: , Rfl: 3 .  Vitamin D, Ergocalciferol, (DRISDOL) 50000 units CAPS capsule, TAKE ONE CAPSULE BY MOUTH ONCE A WEEK, Disp: 16 capsule, Rfl: 2  Allergies:  Allergies  Allergen Reactions  . Dye Fdc Blue [Brilliant Blue Fcf (Fd&C Blue #1)]     Gives pt hives and causes nausea  CT scan dye    Past Medical History, Surgical history, Social history, and Family History were reviewed and updated.  Review of Systems: As above  Physical Exam:  weight is 195 lb (88.5 kg). Her oral temperature is 98.2 F (36.8 C). Her blood pressure is 163/70 (abnormal) and her pulse is 89. Her respiration is 16 and oxygen saturation is 100%.   [name] and well-nourished white female. Lungs are clear. Cardiac exam regular rate and rhythm with no murmurs rubs or bruits. Abdomen is soft. Has good bowel sounds. There is no fluid wave. There is no palpable abdominal mass. There is a palpable hepatosplenomegaly. Breasts exam shows the left breast with no masses edema or erythema. There is no left axillary adenopathy. Right chest wall shows radiation telangiectasias. These are chronic. Right is  without nodules. There is no right axillary adenopathy. Back exam no tenderness over the spine ribs or hips. Extremities shows some lymphedema of the right arm but this is improved. Has good range of motion of her joints.chest wall  Lab Results  Component Value Date   WBC 8.1 02/18/2017   HGB 11.1 (L) 02/18/2017   HCT 34.4 (L) 02/18/2017   MCV 91 02/18/2017   PLT 189 02/18/2017     Chemistry      Component Value Date/Time   NA 145 08/21/2016 1010   K 4.0 08/21/2016 1010   CL 106 08/07/2014 0922   CO2 28 08/21/2016 1010   BUN 19.3 08/21/2016 1010   CREATININE 0.7 08/21/2016  1010      Component Value Date/Time   CALCIUM 9.3 08/21/2016 1010   ALKPHOS 82 08/21/2016 1010   AST 21 08/21/2016 1010   ALT 21 08/21/2016 1010   BILITOT 0.37 08/21/2016 1010         Impression and Plan: Ms. [name] is  66 year old white female. She has a history of locally recurrent adenocarcinoma of the right breast. She now is out from resection and radiation by 5 years. She is on Aromasin. She's done well with Aromasin. I don't see any evidence of recurrence.  From my point of view, erythema really looks good. I just don't see any evidence of recurrent disease.  I'll like to get her back in one month. I want to make sure that we follow-up with this discomfort. I have noted that her hemoglobin is going down slowly. I will check iron studies on her. aired.   [name], MD 5/9/201810:29 AM

## 2017-02-19 LAB — VITAMIN D 25 HYDROXY (VIT D DEFICIENCY, FRACTURES): VIT D 25 HYDROXY: 45.5 ng/mL (ref 30.0–100.0)

## 2017-03-19 ENCOUNTER — Other Ambulatory Visit (HOSPITAL_BASED_OUTPATIENT_CLINIC_OR_DEPARTMENT_OTHER): Payer: Medicare Other

## 2017-03-19 ENCOUNTER — Ambulatory Visit (HOSPITAL_BASED_OUTPATIENT_CLINIC_OR_DEPARTMENT_OTHER): Payer: Medicare Other | Admitting: Hematology & Oncology

## 2017-03-19 VITALS — BP 155/63 | HR 70 | Temp 98.1°F | Resp 18 | Wt 196.0 lb

## 2017-03-19 DIAGNOSIS — C50011 Malignant neoplasm of nipple and areola, right female breast: Secondary | ICD-10-CM | POA: Diagnosis not present

## 2017-03-19 DIAGNOSIS — C50911 Malignant neoplasm of unspecified site of right female breast: Secondary | ICD-10-CM

## 2017-03-19 DIAGNOSIS — Z170 Estrogen receptor positive status [ER+]: Principal | ICD-10-CM

## 2017-03-19 DIAGNOSIS — Z79811 Long term (current) use of aromatase inhibitors: Secondary | ICD-10-CM | POA: Diagnosis not present

## 2017-03-19 DIAGNOSIS — Z86718 Personal history of other venous thrombosis and embolism: Secondary | ICD-10-CM | POA: Diagnosis not present

## 2017-03-19 DIAGNOSIS — D500 Iron deficiency anemia secondary to blood loss (chronic): Secondary | ICD-10-CM | POA: Diagnosis not present

## 2017-03-19 DIAGNOSIS — Z7982 Long term (current) use of aspirin: Secondary | ICD-10-CM

## 2017-03-19 LAB — CMP (CANCER CENTER ONLY)
ALK PHOS: 74 U/L (ref 26–84)
ALT: 18 U/L (ref 10–47)
AST: 22 U/L (ref 11–38)
Albumin: 3.5 g/dL (ref 3.3–5.5)
BILIRUBIN TOTAL: 0.6 mg/dL (ref 0.20–1.60)
BUN: 16 mg/dL (ref 7–22)
CO2: 30 mEq/L (ref 18–33)
CREATININE: 0.9 mg/dL (ref 0.6–1.2)
Calcium: 9.3 mg/dL (ref 8.0–10.3)
Chloride: 106 mEq/L (ref 98–108)
GLUCOSE: 98 mg/dL (ref 73–118)
POTASSIUM: 3.7 meq/L (ref 3.3–4.7)
SODIUM: 141 meq/L (ref 128–145)
TOTAL PROTEIN: 7.1 g/dL (ref 6.4–8.1)

## 2017-03-19 LAB — CBC WITH DIFFERENTIAL (CANCER CENTER ONLY)
BASO#: 0 10*3/uL (ref 0.0–0.2)
BASO%: 0.5 % (ref 0.0–2.0)
EOS%: 3.7 % (ref 0.0–7.0)
Eosinophils Absolute: 0.2 10*3/uL (ref 0.0–0.5)
HCT: 33.4 % — ABNORMAL LOW (ref 34.8–46.6)
HGB: 10.7 g/dL — ABNORMAL LOW (ref 11.6–15.9)
LYMPH#: 2.2 10*3/uL (ref 0.9–3.3)
LYMPH%: 34.2 % (ref 14.0–48.0)
MCH: 29.2 pg (ref 26.0–34.0)
MCHC: 32 g/dL (ref 32.0–36.0)
MCV: 91 fL (ref 81–101)
MONO#: 0.5 10*3/uL (ref 0.1–0.9)
MONO%: 7.6 % (ref 0.0–13.0)
NEUT#: 3.4 10*3/uL (ref 1.5–6.5)
NEUT%: 54 % (ref 39.6–80.0)
PLATELETS: 189 10*3/uL (ref 145–400)
RBC: 3.66 10*6/uL — ABNORMAL LOW (ref 3.70–5.32)
RDW: 13.5 % (ref 11.1–15.7)
WBC: 6.3 10*3/uL (ref 3.9–10.0)

## 2017-03-19 LAB — IRON AND TIBC
%SAT: 22 % (ref 21–57)
Iron: 53 ug/dL (ref 41–142)
TIBC: 236 ug/dL (ref 236–444)
UIBC: 183 ug/dL (ref 120–384)

## 2017-03-19 LAB — FERRITIN: Ferritin: 267 ng/ml (ref 9–269)

## 2017-03-19 NOTE — Progress Notes (Signed)
Hematology and Oncology Follow Up Visit  [name] [id number] [birth year] 66 y.o. 03/19/2017   Principle Diagnosis:  Locally recurrent adenocarcinoma of the right breast. 2. History of superficial venous thrombus of the right thigh.   Current Therapy:   1. Aromasin 25 mg p.o. daily. 2. Aspirin 81 mg p.o. daily.     Interim History:  [name] is back for followup. She is feeling better. The really is now much pain over on the right anterior chest wall. She was having pain we saw her last. We did a CT scan. This did not show any issues with respect to her breast cancer.  She is looking forward to a nice summer.  She has had no bleeding or bruising. There is no change in bowel or bladder habits.  She is doing well with Aromasin.  She does have the lymphedema in the right arm. This has not caused any problems. There's been no issues with cellulitis.  Overall, her performance status is ECOG 1   Medications:  Current Outpatient Prescriptions:  .  aspirin EC 81 MG tablet, Take 81 mg by mouth daily., Disp: , Rfl:  .  exemestane (AROMASIN) 25 MG tablet, TAKE 1 TABLET BY MOUTH EVERY DAY, Disp: 30 tablet, Rfl: 11 .  gabapentin (NEURONTIN) 300 MG capsule, Take 300 mg by mouth., Disp: , Rfl:  .  hydrochlorothiazide 25 MG tablet, Take 25 mg by mouth daily. , Disp: , Rfl:  .  ibuprofen (ADVIL,MOTRIN) 200 MG tablet, Take 200 mg by mouth every 6 (six) hours as needed., Disp: , Rfl:  .  omeprazole (PRILOSEC) 20 MG capsule, Take 20 mg by mouth as needed. , Disp: , Rfl: 0 .  predniSONE (DELTASONE) 10 MG tablet, Take 10 mg by mouth as needed. , Disp: , Rfl:  .  simvastatin (ZOCOR) 20 MG tablet, Take 20 mg by mouth daily., Disp: , Rfl: 4 .  Telmisartan-Amlodipine 80-5 MG TABS, Take 1 tablet by mouth daily., Disp: , Rfl: 3 .  Vitamin D, Ergocalciferol, (DRISDOL) 50000 units CAPS capsule, TAKE ONE CAPSULE BY MOUTH ONCE A WEEK, Disp: 16 capsule, Rfl: 2  Allergies:  Allergies  Allergen Reactions  .  Contrast Media [Iodinated Diagnostic Agents] Hives, Shortness Of Breath and Nausea And Vomiting    Allergic reaction even after pre-meds.  Vernon Prey Fdc Blue [Brilliant Blue Fcf (Fd&C Blue #1)]     Gives pt hives and causes nausea  CT scan dye    Past Medical History, Surgical history, Social history, and Family History were reviewed and updated.  Review of Systems: As above  Physical Exam:  weight is 196 lb (88.9 kg). Her oral temperature is 98.1 F (36.7 C). Her blood pressure is 155/63 (abnormal) and her pulse is 70. Her respiration is 18 and oxygen saturation is 100%.   [name] and well-nourished white female. Lungs are clear. Cardiac exam regular rate and rhythm with no murmurs rubs or bruits. Abdomen is soft. Has good bowel sounds. There is no fluid wave. There is no palpable abdominal mass. There is a palpable hepatosplenomegaly. Breasts exam shows the left breast with no masses edema or erythema. There is no left axillary adenopathy. Right chest wall shows radiation telangiectasias. These are chronic. Right is without nodules. There is no right axillary adenopathy. Back exam no tenderness over the spine ribs or hips. Extremities shows some lymphedema of the right arm but this is improved. Has good range of motion of her joints.chest wall  Lab Results  Component Value Date   WBC 6.3 03/19/2017   HGB 10.7 (L) 03/19/2017   HCT 33.4 (L) 03/19/2017   MCV 91 03/19/2017   PLT 189 03/19/2017     Chemistry      Component Value Date/Time   NA 144 02/18/2017 0901   K 3.7 02/18/2017 0901   CL 106 08/07/2014 0922   CO2 27 02/18/2017 0901   BUN 17.7 02/18/2017 0901   CREATININE 0.7 02/18/2017 0901      Component Value Date/Time   CALCIUM 9.3 02/18/2017 0901   ALKPHOS 78 02/18/2017 0901   AST 13 02/18/2017 0901   ALT 12 02/18/2017 0901   BILITOT 0.54 02/18/2017 0901         Impression and Plan: [name] is  66 year old white female. She has a history of locally recurrent  adenocarcinoma of the right breast. She now is out from resection and radiation by 5 years. She is on Aromasin. She's done well with Aromasin. I don't see any evidence of recurrence.  We will now get her back in 4 months. I think this would be reasonable.  If her iron studies are low, then we will get her in for IV iron.  I'm just glad that she is feeling better.   [name], MD 6/7/201811:39 AM

## 2017-03-20 LAB — CANCER ANTIGEN 27.29: CAN 27.29: 19.6 U/mL (ref 0.0–38.6)

## 2017-03-23 ENCOUNTER — Other Ambulatory Visit: Payer: Self-pay | Admitting: Hematology & Oncology

## 2017-03-23 DIAGNOSIS — Z1231 Encounter for screening mammogram for malignant neoplasm of breast: Secondary | ICD-10-CM

## 2017-03-29 ENCOUNTER — Other Ambulatory Visit: Payer: Self-pay | Admitting: Hematology & Oncology

## 2017-04-23 DIAGNOSIS — L820 Inflamed seborrheic keratosis: Secondary | ICD-10-CM | POA: Diagnosis not present

## 2017-04-23 DIAGNOSIS — D225 Melanocytic nevi of trunk: Secondary | ICD-10-CM | POA: Diagnosis not present

## 2017-04-23 DIAGNOSIS — L814 Other melanin hyperpigmentation: Secondary | ICD-10-CM | POA: Diagnosis not present

## 2017-04-23 DIAGNOSIS — L821 Other seborrheic keratosis: Secondary | ICD-10-CM | POA: Diagnosis not present

## 2017-04-23 DIAGNOSIS — D2239 Melanocytic nevi of other parts of face: Secondary | ICD-10-CM | POA: Diagnosis not present

## 2017-05-01 ENCOUNTER — Other Ambulatory Visit: Payer: Self-pay

## 2017-05-04 ENCOUNTER — Ambulatory Visit
Admission: RE | Admit: 2017-05-04 | Discharge: 2017-05-04 | Disposition: A | Discharge status: Home / Self Care | Payer: Medicare Other | Source: Ambulatory Visit | Attending: Hematology & Oncology | Admitting: Hematology & Oncology

## 2017-05-04 DIAGNOSIS — Z1231 Encounter for screening mammogram for malignant neoplasm of breast: Secondary | ICD-10-CM

## 2017-05-25 DIAGNOSIS — M1611 Unilateral primary osteoarthritis, right hip: Secondary | ICD-10-CM | POA: Diagnosis not present

## 2017-05-28 ENCOUNTER — Other Ambulatory Visit: Payer: Self-pay | Admitting: *Deleted

## 2017-05-28 MED ORDER — VITAMIN D (ERGOCALCIFEROL) 1.25 MG (50000 UNIT) PO CAPS: 50000.0000 [IU] | ORAL_CAPSULE | ORAL | 2 refills | Status: DC

## 2017-05-29 DIAGNOSIS — M25551 Pain in right hip: Secondary | ICD-10-CM | POA: Diagnosis not present

## 2017-05-29 DIAGNOSIS — M1611 Unilateral primary osteoarthritis, right hip: Secondary | ICD-10-CM | POA: Diagnosis not present

## 2017-06-17 DIAGNOSIS — I10 Essential (primary) hypertension: Secondary | ICD-10-CM | POA: Diagnosis not present

## 2017-06-17 DIAGNOSIS — E782 Mixed hyperlipidemia: Secondary | ICD-10-CM | POA: Diagnosis not present

## 2017-06-26 DIAGNOSIS — Z7189 Other specified counseling: Secondary | ICD-10-CM | POA: Diagnosis not present

## 2017-06-26 DIAGNOSIS — Z139 Encounter for screening, unspecified: Secondary | ICD-10-CM | POA: Diagnosis not present

## 2017-06-26 DIAGNOSIS — Z1379 Encounter for other screening for genetic and chromosomal anomalies: Secondary | ICD-10-CM | POA: Diagnosis not present

## 2017-06-26 DIAGNOSIS — Z23 Encounter for immunization: Secondary | ICD-10-CM | POA: Diagnosis not present

## 2017-06-26 DIAGNOSIS — I10 Essential (primary) hypertension: Secondary | ICD-10-CM | POA: Diagnosis not present

## 2017-06-26 DIAGNOSIS — Z6835 Body mass index (BMI) 35.0-35.9, adult: Secondary | ICD-10-CM | POA: Diagnosis not present

## 2017-06-26 DIAGNOSIS — E782 Mixed hyperlipidemia: Secondary | ICD-10-CM | POA: Diagnosis not present

## 2017-06-26 DIAGNOSIS — M79609 Pain in unspecified limb: Secondary | ICD-10-CM | POA: Diagnosis not present

## 2017-06-26 DIAGNOSIS — F3160 Bipolar disorder, current episode mixed, unspecified: Secondary | ICD-10-CM | POA: Diagnosis not present

## 2017-07-09 DIAGNOSIS — M1611 Unilateral primary osteoarthritis, right hip: Secondary | ICD-10-CM | POA: Diagnosis not present

## 2017-07-23 ENCOUNTER — Other Ambulatory Visit (HOSPITAL_BASED_OUTPATIENT_CLINIC_OR_DEPARTMENT_OTHER): Payer: Medicare Other

## 2017-07-23 ENCOUNTER — Ambulatory Visit (HOSPITAL_BASED_OUTPATIENT_CLINIC_OR_DEPARTMENT_OTHER): Payer: Medicare Other | Admitting: Hematology & Oncology

## 2017-07-23 VITALS — BP 161/60 | HR 65 | Temp 98.4°F | Resp 18 | Wt 200.2 lb

## 2017-07-23 DIAGNOSIS — C50011 Malignant neoplasm of nipple and areola, right female breast: Secondary | ICD-10-CM

## 2017-07-23 DIAGNOSIS — C50911 Malignant neoplasm of unspecified site of right female breast: Secondary | ICD-10-CM | POA: Diagnosis not present

## 2017-07-23 DIAGNOSIS — Z79811 Long term (current) use of aromatase inhibitors: Secondary | ICD-10-CM

## 2017-07-23 DIAGNOSIS — Z170 Estrogen receptor positive status [ER+]: Secondary | ICD-10-CM

## 2017-07-23 DIAGNOSIS — Z86718 Personal history of other venous thrombosis and embolism: Secondary | ICD-10-CM | POA: Diagnosis not present

## 2017-07-23 DIAGNOSIS — M25551 Pain in right hip: Secondary | ICD-10-CM

## 2017-07-23 LAB — CBC WITH DIFFERENTIAL (CANCER CENTER ONLY)
BASO#: 0 10*3/uL (ref 0.0–0.2)
BASO%: 0.4 % (ref 0.0–2.0)
EOS%: 3.5 % (ref 0.0–7.0)
Eosinophils Absolute: 0.2 10*3/uL (ref 0.0–0.5)
HCT: 33.5 % — ABNORMAL LOW (ref 34.8–46.6)
HEMOGLOBIN: 10.8 g/dL — AB (ref 11.6–15.9)
LYMPH#: 1.8 10*3/uL (ref 0.9–3.3)
LYMPH%: 34.1 % (ref 14.0–48.0)
MCH: 29.7 pg (ref 26.0–34.0)
MCHC: 32.2 g/dL (ref 32.0–36.0)
MCV: 92 fL (ref 81–101)
MONO#: 0.5 10*3/uL (ref 0.1–0.9)
MONO%: 9.5 % (ref 0.0–13.0)
NEUT#: 2.8 10*3/uL (ref 1.5–6.5)
NEUT%: 52.5 % (ref 39.6–80.0)
PLATELETS: 218 10*3/uL (ref 145–400)
RBC: 3.64 10*6/uL — AB (ref 3.70–5.32)
RDW: 14 % (ref 11.1–15.7)
WBC: 5.4 10*3/uL (ref 3.9–10.0)

## 2017-07-23 LAB — CMP (CANCER CENTER ONLY)
ALK PHOS: 67 U/L (ref 26–84)
ALT: 20 U/L (ref 10–47)
AST: 20 U/L (ref 11–38)
Albumin: 3.6 g/dL (ref 3.3–5.5)
BILIRUBIN TOTAL: 0.6 mg/dL (ref 0.20–1.60)
BUN: 16 mg/dL (ref 7–22)
CALCIUM: 9.3 mg/dL (ref 8.0–10.3)
CO2: 31 meq/L (ref 18–33)
CREATININE: 0.8 mg/dL (ref 0.6–1.2)
Chloride: 107 mEq/L (ref 98–108)
GLUCOSE: 106 mg/dL (ref 73–118)
Potassium: 3.7 mEq/L (ref 3.3–4.7)
SODIUM: 146 meq/L — AB (ref 128–145)
Total Protein: 7.1 g/dL (ref 6.4–8.1)

## 2017-07-23 LAB — IRON AND TIBC
%SAT: 25 % (ref 21–57)
Iron: 56 ug/dL (ref 41–142)
TIBC: 226 ug/dL — ABNORMAL LOW (ref 236–444)
UIBC: 170 ug/dL (ref 120–384)

## 2017-07-23 LAB — FERRITIN: Ferritin: 266 ng/ml (ref 9–269)

## 2017-07-23 NOTE — Progress Notes (Signed)
Hematology and Oncology Follow Up Visit  [name] [id number] 05/02/1951 66 y.o. 07/23/2017   Principle Diagnosis:  Locally recurrent adenocarcinoma of the right breast. 2. History of superficial venous thrombus of the right thigh.   Current Therapy:   1. Aromasin 25 mg p.o. daily. 2. Aspirin 81 mg p.o. daily.     Interim History:  [name] is back for followup. She is still bothered by pain in the right hip. She had her left hip operated on for a couple years ago. She has needed surgery for the right hip. She is trying to hold off on this as long as possible.  She's had a good summer. She and her family went to the beach.  She's had no cough. She's had no shortness of breath. She's had no nausea or vomiting.  There's been no issues with change in bowel or bladder habits.  She's had no leg swelling. She has chronic lymphedema of the right arm.   Overall, her performance status is ECOG 1   Medications:  Current Outpatient Prescriptions:  .  aspirin EC 81 MG tablet, Take 81 mg by mouth daily., Disp: , Rfl:  .  exemestane (AROMASIN) 25 MG tablet, TAKE 1 TABLET BY MOUTH EVERY DAY, Disp: 30 tablet, Rfl: 11 .  hydrochlorothiazide 25 MG tablet, Take 25 mg by mouth daily. , Disp: , Rfl:  .  ibuprofen (ADVIL,MOTRIN) 200 MG tablet, Take 200 mg by mouth every 6 (six) hours as needed., Disp: , Rfl:  .  simvastatin (ZOCOR) 20 MG tablet, Take 20 mg by mouth daily., Disp: , Rfl: 4 .  Telmisartan-Amlodipine 80-5 MG TABS, Take 1 tablet by mouth daily., Disp: , Rfl: 3 .  Vitamin D, Ergocalciferol, (DRISDOL) 50000 units CAPS capsule, Take 1 capsule (50,000 Units total) by mouth once a week., Disp: 16 capsule, Rfl: 2  Allergies:  Allergies  Allergen Reactions  . Contrast Media [Iodinated Diagnostic Agents] Hives, Shortness Of Breath and Nausea And Vomiting    Allergic reaction even after pre-meds.  Vernon Prey Fdc Blue [Brilliant Blue Fcf (Fd&C Blue #1)]     Gives pt hives and causes nausea  CT  scan dye    Past Medical History, Surgical history, Social history, and Family History were reviewed and updated.  Review of Systems: As stated in the interim history  Physical Exam:  weight is 200 lb 4 oz (90.8 kg). Her oral temperature is 98.4 F (36.9 C). Her blood pressure is 161/60 (abnormal) and her pulse is 65. Her respiration is 18 and oxygen saturation is 100%.   I examined [name]. The results of my examination are noted below with any appropriate changes indicated:   [name] and well-nourished white female. Lungs are clear. Cardiac exam regular rate and rhythm with no murmurs rubs or bruits. Abdomen is soft. Has good bowel sounds. There is no fluid wave. There is no palpable abdominal mass. There is a palpable hepatosplenomegaly. Breasts exam shows the left breast with no masses edema or erythema. There is no left axillary adenopathy. Right chest wall shows radiation telangiectasias. These are chronic. Right is without nodules. There is no right axillary adenopathy. Back exam no tenderness over the spine ribs or hips. Extremities shows some lymphedema of the right arm but this is improved. Has good range of motion of her joints.chest wall  Lab Results  Component Value Date   WBC 5.4 07/23/2017   HGB 10.8 (L) 07/23/2017   HCT 33.5 (L) 07/23/2017   MCV  92 07/23/2017   PLT 218 07/23/2017     Chemistry      Component Value Date/Time   NA 146 (H) 07/23/2017 0900   NA 144 02/18/2017 0901   K 3.7 07/23/2017 0900   K 3.7 02/18/2017 0901   CL 107 07/23/2017 0900   CO2 31 07/23/2017 0900   CO2 27 02/18/2017 0901   BUN 16 07/23/2017 0900   BUN 17.7 02/18/2017 0901   CREATININE 0.8 07/23/2017 0900   CREATININE 0.7 02/18/2017 0901      Component Value Date/Time   CALCIUM 9.3 07/23/2017 0900   CALCIUM 9.3 02/18/2017 0901   ALKPHOS 67 07/23/2017 0900   ALKPHOS 78 02/18/2017 0901   AST 20 07/23/2017 0900   AST 13 02/18/2017 0901   ALT 20 07/23/2017 0900   ALT 12  02/18/2017 0901   BILITOT 0.60 07/23/2017 0900   BILITOT 0.54 02/18/2017 0901         Impression and Plan: [name] is  66 year old white female. She has a history of locally recurrent adenocarcinoma of the right breast. She now is out from resection and radiation by 5 years. She is on Aromasin. She's done well with Aromasin. I don't see any evidence of recurrence.  I do not see that we have to add anything to the Aromasin. I do not see anything that looks like progressive disease.  I told her that she really needs to have the right hip operated on. I does feel bad that she is suffering with pain.  Just to be thorough, we will get a bone scan just to make sure that there is nothing related to her having breast cancer.  We will plan to see her back in another 6 months. Hopefully by time we see her back, she will have had her hip operated on.    [name], MD 10/11/201811:19 AM

## 2017-07-24 ENCOUNTER — Telehealth: Payer: Self-pay | Admitting: *Deleted

## 2017-07-24 LAB — CANCER ANTIGEN 27.29: CA 27.29: 17.9 U/mL (ref 0.0–38.6)

## 2017-07-24 NOTE — Telephone Encounter (Addendum)
Patient is aware of results  ----- Message from [name], MD sent at 07/24/2017  6:14 AM EDT ----- Call - iron level is  [facility]!!  pete

## 2017-07-31 ENCOUNTER — Encounter (HOSPITAL_COMMUNITY)
Admission: RE | Admit: 2017-07-31 | Discharge: 2017-07-31 | Disposition: A | Discharge status: Home / Self Care | Payer: Medicare Other | Source: Ambulatory Visit | Attending: Hematology & Oncology | Admitting: Hematology & Oncology

## 2017-07-31 DIAGNOSIS — C50011 Malignant neoplasm of nipple and areola, right female breast: Secondary | ICD-10-CM | POA: Diagnosis not present

## 2017-07-31 DIAGNOSIS — Z170 Estrogen receptor positive status [ER+]: Secondary | ICD-10-CM | POA: Insufficient documentation

## 2017-07-31 DIAGNOSIS — C50919 Malignant neoplasm of unspecified site of unspecified female breast: Secondary | ICD-10-CM | POA: Diagnosis not present

## 2017-07-31 MED ORDER — TECHNETIUM TC 99M MEDRONATE IV KIT
21.4000 | PACK | Freq: Once | INTRAVENOUS | Status: AC | PRN
  Administered 2017-07-31: 21.4 via INTRAVENOUS

## 2017-08-10 ENCOUNTER — Other Ambulatory Visit: Payer: Self-pay | Admitting: Hematology & Oncology

## 2017-08-10 DIAGNOSIS — M899 Disorder of bone, unspecified: Secondary | ICD-10-CM

## 2017-08-25 DIAGNOSIS — B351 Tinea unguium: Secondary | ICD-10-CM | POA: Diagnosis not present

## 2017-08-31 ENCOUNTER — Other Ambulatory Visit: Payer: Self-pay | Admitting: Family

## 2017-08-31 DIAGNOSIS — M899 Disorder of bone, unspecified: Secondary | ICD-10-CM

## 2017-08-31 DIAGNOSIS — Z170 Estrogen receptor positive status [ER+]: Principal | ICD-10-CM

## 2017-08-31 DIAGNOSIS — C50011 Malignant neoplasm of nipple and areola, right female breast: Secondary | ICD-10-CM

## 2017-09-12 ENCOUNTER — Ambulatory Visit (HOSPITAL_COMMUNITY): Payer: Medicare Other

## 2017-09-14 ENCOUNTER — Encounter (HOSPITAL_COMMUNITY): Payer: Self-pay

## 2017-09-14 ENCOUNTER — Ambulatory Visit (HOSPITAL_COMMUNITY)
Admission: RE | Admit: 2017-09-14 | Discharge: 2017-09-14 | Disposition: A | Discharge status: Home / Self Care | Payer: Medicare Other | Source: Ambulatory Visit | Attending: Family | Admitting: Family

## 2017-09-14 DIAGNOSIS — Z170 Estrogen receptor positive status [ER+]: Principal | ICD-10-CM

## 2017-09-14 DIAGNOSIS — C50011 Malignant neoplasm of nipple and areola, right female breast: Secondary | ICD-10-CM

## 2017-09-14 DIAGNOSIS — M899 Disorder of bone, unspecified: Secondary | ICD-10-CM

## 2017-10-08 ENCOUNTER — Other Ambulatory Visit: Payer: Self-pay | Admitting: Hematology & Oncology

## 2017-10-08 DIAGNOSIS — C50011 Malignant neoplasm of nipple and areola, right female breast: Secondary | ICD-10-CM

## 2017-10-08 DIAGNOSIS — Z170 Estrogen receptor positive status [ER+]: Principal | ICD-10-CM

## 2017-10-08 NOTE — Progress Notes (Signed)
ct 

## 2017-10-09 DIAGNOSIS — I10 Essential (primary) hypertension: Secondary | ICD-10-CM | POA: Diagnosis not present

## 2017-10-09 DIAGNOSIS — M1611 Unilateral primary osteoarthritis, right hip: Secondary | ICD-10-CM | POA: Diagnosis not present

## 2017-10-09 DIAGNOSIS — Z7982 Long term (current) use of aspirin: Secondary | ICD-10-CM | POA: Diagnosis not present

## 2017-10-09 DIAGNOSIS — Z79899 Other long term (current) drug therapy: Secondary | ICD-10-CM | POA: Diagnosis not present

## 2017-10-09 DIAGNOSIS — M25552 Pain in left hip: Secondary | ICD-10-CM | POA: Diagnosis not present

## 2017-10-10 DIAGNOSIS — M25552 Pain in left hip: Secondary | ICD-10-CM | POA: Diagnosis not present

## 2017-10-15 DIAGNOSIS — M1611 Unilateral primary osteoarthritis, right hip: Secondary | ICD-10-CM | POA: Diagnosis not present

## 2017-10-15 DIAGNOSIS — M545 Low back pain: Secondary | ICD-10-CM | POA: Diagnosis not present

## 2017-10-16 ENCOUNTER — Ambulatory Visit (HOSPITAL_BASED_OUTPATIENT_CLINIC_OR_DEPARTMENT_OTHER)
Admission: RE | Admit: 2017-10-16 | Discharge: 2017-10-16 | Disposition: A | Discharge status: Home / Self Care | Payer: Medicare Other | Source: Ambulatory Visit | Attending: Hematology & Oncology | Admitting: Hematology & Oncology

## 2017-10-16 DIAGNOSIS — Z170 Estrogen receptor positive status [ER+]: Secondary | ICD-10-CM | POA: Diagnosis not present

## 2017-10-16 DIAGNOSIS — C50912 Malignant neoplasm of unspecified site of left female breast: Secondary | ICD-10-CM | POA: Diagnosis not present

## 2017-10-16 DIAGNOSIS — M899 Disorder of bone, unspecified: Secondary | ICD-10-CM | POA: Diagnosis not present

## 2017-10-16 DIAGNOSIS — C50011 Malignant neoplasm of nipple and areola, right female breast: Secondary | ICD-10-CM | POA: Diagnosis not present

## 2017-10-16 DIAGNOSIS — I2510 Atherosclerotic heart disease of native coronary artery without angina pectoris: Secondary | ICD-10-CM | POA: Insufficient documentation

## 2017-10-19 ENCOUNTER — Telehealth: Payer: Self-pay | Admitting: *Deleted

## 2017-10-19 NOTE — Telephone Encounter (Addendum)
Patient is aware of results  ----- Message from [name], MD sent at 10/16/2017  4:55 PM EST ----- call - NO obvious cancer in the rib!!!  Happy New Year!!  Laurey Arrow

## 2017-10-21 DIAGNOSIS — M545 Low back pain: Secondary | ICD-10-CM | POA: Diagnosis not present

## 2017-10-21 DIAGNOSIS — M4800 Spinal stenosis, site unspecified: Secondary | ICD-10-CM | POA: Diagnosis not present

## 2017-10-29 DIAGNOSIS — M1612 Unilateral primary osteoarthritis, left hip: Secondary | ICD-10-CM | POA: Diagnosis not present

## 2017-11-12 DIAGNOSIS — M47896 Other spondylosis, lumbar region: Secondary | ICD-10-CM | POA: Diagnosis not present

## 2017-11-12 DIAGNOSIS — M545 Low back pain: Secondary | ICD-10-CM | POA: Diagnosis not present

## 2017-11-12 DIAGNOSIS — M48062 Spinal stenosis, lumbar region with neurogenic claudication: Secondary | ICD-10-CM | POA: Diagnosis not present

## 2017-12-07 DIAGNOSIS — C50919 Malignant neoplasm of unspecified site of unspecified female breast: Secondary | ICD-10-CM | POA: Diagnosis not present

## 2017-12-07 DIAGNOSIS — Z801 Family history of malignant neoplasm of trachea, bronchus and lung: Secondary | ICD-10-CM | POA: Diagnosis not present

## 2017-12-07 DIAGNOSIS — Z86010 Personal history of colonic polyps: Secondary | ICD-10-CM | POA: Diagnosis not present

## 2017-12-07 DIAGNOSIS — Z8049 Family history of malignant neoplasm of other genital organs: Secondary | ICD-10-CM | POA: Diagnosis not present

## 2017-12-07 DIAGNOSIS — Z853 Personal history of malignant neoplasm of breast: Secondary | ICD-10-CM | POA: Diagnosis not present

## 2017-12-07 DIAGNOSIS — M48062 Spinal stenosis, lumbar region with neurogenic claudication: Secondary | ICD-10-CM | POA: Diagnosis not present

## 2017-12-14 DIAGNOSIS — I10 Essential (primary) hypertension: Secondary | ICD-10-CM | POA: Diagnosis not present

## 2017-12-14 DIAGNOSIS — E782 Mixed hyperlipidemia: Secondary | ICD-10-CM | POA: Diagnosis not present

## 2017-12-21 DIAGNOSIS — Z0000 Encounter for general adult medical examination without abnormal findings: Secondary | ICD-10-CM | POA: Diagnosis not present

## 2017-12-21 DIAGNOSIS — Z6835 Body mass index (BMI) 35.0-35.9, adult: Secondary | ICD-10-CM | POA: Diagnosis not present

## 2017-12-21 DIAGNOSIS — Z9181 History of falling: Secondary | ICD-10-CM | POA: Diagnosis not present

## 2017-12-21 DIAGNOSIS — Z139 Encounter for screening, unspecified: Secondary | ICD-10-CM | POA: Diagnosis not present

## 2017-12-21 DIAGNOSIS — I10 Essential (primary) hypertension: Secondary | ICD-10-CM | POA: Diagnosis not present

## 2017-12-21 DIAGNOSIS — E782 Mixed hyperlipidemia: Secondary | ICD-10-CM | POA: Diagnosis not present

## 2017-12-21 DIAGNOSIS — Z1331 Encounter for screening for depression: Secondary | ICD-10-CM | POA: Diagnosis not present

## 2017-12-24 DIAGNOSIS — Z79899 Other long term (current) drug therapy: Secondary | ICD-10-CM | POA: Diagnosis not present

## 2017-12-24 DIAGNOSIS — M47896 Other spondylosis, lumbar region: Secondary | ICD-10-CM | POA: Diagnosis not present

## 2017-12-24 DIAGNOSIS — Z79891 Long term (current) use of opiate analgesic: Secondary | ICD-10-CM | POA: Diagnosis not present

## 2017-12-24 DIAGNOSIS — M48062 Spinal stenosis, lumbar region with neurogenic claudication: Secondary | ICD-10-CM | POA: Diagnosis not present

## 2017-12-24 DIAGNOSIS — G894 Chronic pain syndrome: Secondary | ICD-10-CM | POA: Diagnosis not present

## 2017-12-24 DIAGNOSIS — M545 Low back pain: Secondary | ICD-10-CM | POA: Diagnosis not present

## 2018-01-04 DIAGNOSIS — Z6835 Body mass index (BMI) 35.0-35.9, adult: Secondary | ICD-10-CM | POA: Diagnosis not present

## 2018-01-04 DIAGNOSIS — Z01419 Encounter for gynecological examination (general) (routine) without abnormal findings: Secondary | ICD-10-CM | POA: Diagnosis not present

## 2018-01-20 ENCOUNTER — Other Ambulatory Visit: Payer: Self-pay | Admitting: Family

## 2018-01-20 DIAGNOSIS — Z170 Estrogen receptor positive status [ER+]: Principal | ICD-10-CM

## 2018-01-20 DIAGNOSIS — D500 Iron deficiency anemia secondary to blood loss (chronic): Secondary | ICD-10-CM

## 2018-01-20 DIAGNOSIS — C50011 Malignant neoplasm of nipple and areola, right female breast: Secondary | ICD-10-CM

## 2018-01-21 ENCOUNTER — Other Ambulatory Visit: Payer: Self-pay

## 2018-01-21 ENCOUNTER — Inpatient Hospital Stay: Payer: Medicare Other

## 2018-01-21 ENCOUNTER — Encounter: Payer: Self-pay | Admitting: Family

## 2018-01-21 ENCOUNTER — Inpatient Hospital Stay: Payer: Medicare Other | Attending: Hematology & Oncology | Admitting: Family

## 2018-01-21 VITALS — BP 162/71 | HR 76 | Temp 98.4°F | Resp 16 | Wt 201.0 lb

## 2018-01-21 DIAGNOSIS — Z170 Estrogen receptor positive status [ER+]: Principal | ICD-10-CM

## 2018-01-21 DIAGNOSIS — Z923 Personal history of irradiation: Secondary | ICD-10-CM | POA: Diagnosis not present

## 2018-01-21 DIAGNOSIS — Z79811 Long term (current) use of aromatase inhibitors: Secondary | ICD-10-CM | POA: Insufficient documentation

## 2018-01-21 DIAGNOSIS — C50911 Malignant neoplasm of unspecified site of right female breast: Secondary | ICD-10-CM | POA: Insufficient documentation

## 2018-01-21 DIAGNOSIS — D500 Iron deficiency anemia secondary to blood loss (chronic): Secondary | ICD-10-CM

## 2018-01-21 DIAGNOSIS — I890 Lymphedema, not elsewhere classified: Secondary | ICD-10-CM | POA: Diagnosis not present

## 2018-01-21 DIAGNOSIS — C50011 Malignant neoplasm of nipple and areola, right female breast: Secondary | ICD-10-CM

## 2018-01-21 LAB — CMP (CANCER CENTER ONLY)
ALBUMIN: 3.7 g/dL (ref 3.5–5.0)
ALK PHOS: 82 U/L (ref 40–150)
ALT: 18 U/L (ref 0–55)
ANION GAP: 10 (ref 3–11)
AST: 17 U/L (ref 5–34)
BUN: 18 mg/dL (ref 7–26)
CALCIUM: 9.5 mg/dL (ref 8.4–10.4)
CO2: 27 mmol/L (ref 22–29)
Chloride: 106 mmol/L (ref 98–109)
Creatinine: 0.78 mg/dL (ref 0.60–1.10)
GFR, Estimated: >60 mL/min (ref >60)
GLUCOSE: 99 mg/dL (ref 70–140)
Potassium: 3.6 mmol/L (ref 3.5–5.1)
SODIUM: 143 mmol/L (ref 136–145)
Total Bilirubin: 0.4 mg/dL (ref 0.2–1.2)
Total Protein: 7.2 g/dL (ref 6.4–8.3)

## 2018-01-21 LAB — IRON AND TIBC
Iron: 44 ug/dL (ref 41–142)
Saturation Ratios: 18 % — ABNORMAL LOW (ref 21–57)
TIBC: 245 ug/dL (ref 236–444)
UIBC: 201 ug/dL

## 2018-01-21 LAB — CBC WITH DIFFERENTIAL (CANCER CENTER ONLY)
BASOS PCT: 1 %
Basophils Absolute: 0 10*3/uL (ref 0.0–0.1)
EOS ABS: 0.2 10*3/uL (ref 0.0–0.5)
EOS PCT: 2 %
HCT: 35 % (ref 34.8–46.6)
HEMOGLOBIN: 11.4 g/dL — AB (ref 11.6–15.9)
LYMPHS ABS: 1.8 10*3/uL (ref 0.9–3.3)
Lymphocytes Relative: 29 %
MCH: 29.2 pg (ref 26.0–34.0)
MCHC: 32.6 g/dL (ref 32.0–36.0)
MCV: 89.5 fL (ref 81.0–101.0)
MONOS PCT: 8 %
Monocytes Absolute: 0.5 10*3/uL (ref 0.1–0.9)
NEUTROS PCT: 60 %
Neutro Abs: 3.8 10*3/uL (ref 1.5–6.5)
Platelet Count: 224 10*3/uL (ref 145–400)
RBC: 3.91 MIL/uL (ref 3.70–5.32)
RDW: 13.9 % (ref 11.1–15.7)
WBC Count: 6.2 10*3/uL (ref 3.9–10.0)

## 2018-01-21 LAB — FERRITIN: Ferritin: 190 ng/mL (ref 9–269)

## 2018-01-21 NOTE — Progress Notes (Signed)
Hematology and Oncology Follow Up Visit  [name] [id number] 04/26/1951 67 y.o. 01/21/2018   Principle Diagnosis:  Locally recurrent adenocarcinoma of the right breast History of superficial venous thrombus of the right thigh  Current Therapy:   1. Aromasin 25 mg p.o. daily 2. Aspirin 81 mg p.o. daily   Interim History:  [name] is here today for follow-up. She is doing well but stll having problems with her lower back and right hip. She needs to have a right hip replacement but states that she has to have a lumbar fusion on May 14th before they will address her hip.  She uses a cane when ambulating and standing for support.  She denies having had any falls. No syncopal episodes.  She is doing well on Aromasin and verbalized that she is taking it as prescribed. Her breast exam today was unremarkable.  She has lymphedema in the right arm and wears her compression sleeve as needed. No other swelling present on exam.  No lymphadenopathy found on exam.  She continues to taker baby aspirin daily. She has had no episodes of bleeding. No bruising or petechiae.  No fever, chills, n/v, cough, rash, dizziness, SOB, chest pain, palpitations, abdominal pain or changes in bowel or bladder habits.  She has maintained a good appetite and is staying well hydrated. Her weight is stable.   ECOG Performance Status: 1 - Symptomatic but completely ambulatory  Medications:  Allergies as of 01/21/2018      Reactions   Contrast Media [iodinated Diagnostic Agents] Hives, Shortness Of Breath, Nausea And Vomiting   Allergic reaction even after pre-meds.   Metrizamide Nausea And Vomiting, Shortness Of Breath, Hives   Allergic reaction even after pre-meds.   Other Rash, Shortness Of Breath   Other reaction(s): Nausea And Vomiting Allergic reaction even after pre-meds.   Brilliant Blue Fcf Other (See Comments)   Gives pt hives and causes nausea  CT scan dye   Dye Fdc Blue [brilliant Blue Fcf (fd&c Blue  #1)]    Gives pt hives and causes nausea  CT scan dye      Medication List        Accurate as of 01/21/18  9:08 PM. Always use your most recent med list.          aspirin EC 81 MG tablet Take 81 mg by mouth daily.   exemestane 25 MG tablet Commonly known as:  AROMASIN TAKE 1 TABLET BY MOUTH EVERY DAY   hydrochlorothiazide 25 MG tablet Commonly known as:  HYDRODIURIL Take 25 mg by mouth daily.   HYDROcodone-acetaminophen 5-325 MG tablet Commonly known as:  NORCO/VICODIN Take by mouth as needed.   ibuprofen 200 MG tablet Commonly known as:  ADVIL,MOTRIN Take 200 mg by mouth every 6 (six) hours as needed.   simvastatin 20 MG tablet Commonly known as:  ZOCOR Take 20 mg by mouth daily.   Telmisartan-amLODIPine 80-5 MG Tabs Take 1 tablet by mouth daily.   Vitamin D (Ergocalciferol) 50000 units Caps capsule Commonly known as:  DRISDOL Take 1 capsule (50,000 Units total) by mouth once a week.       Allergies:  Allergies  Allergen Reactions  . Contrast Media [Iodinated Diagnostic Agents] Hives, Shortness Of Breath and Nausea And Vomiting    Allergic reaction even after pre-meds.  . Metrizamide Nausea And Vomiting, Shortness Of Breath and Hives    Allergic reaction even after pre-meds.  . Other Rash and Shortness Of Breath    Other reaction(s):  Nausea And Vomiting Allergic reaction even after pre-meds.  Dillard [place] Blue Fcf Other (See Comments)    Gives pt hives and causes nausea  CT scan dye  . Dye Fdc Blue [Brilliant Blue Fcf (Fd&C Blue #1)]     Gives pt hives and causes nausea  CT scan dye    Past Medical History, Surgical history, Social history, and Family History were reviewed and updated.  Review of Systems: All other 10 point review of systems is negative.   Physical Exam:  weight is 201 lb (91.2 kg). Her oral temperature is 98.4 F (36.9 C). Her blood pressure is 162/71 (abnormal) and her pulse is 76. Her respiration is 16 and oxygen saturation is  99%.   Wt Readings from Last 3 Encounters:  01/21/18 201 lb (91.2 kg)  07/23/17 200 lb 4 oz (90.8 kg)  03/19/17 196 lb (88.9 kg)    Ocular: Sclerae unicteric, pupils equal, round and reactive to light Ear-nose-throat: Oropharynx clear, dentition fair Lymphatic: No cervical, supraclavicular or axillary adenopathy Lungs no rales or rhonchi, good excursion bilaterally Heart regular rate and rhythm, no murmur appreciated Abd soft, nontender, positive bowel sounds, no liver or spleen tip palpated on exam, no fluid wave  MSK no focal spinal tenderness, no joint edema Neuro: non-focal, well-oriented, appropriate affect Breasts: No changes on exam. No mass, lesion or rash noted.   Lab Results  Component Value Date   WBC 6.2 01/21/2018   HGB 10.8 (L) 07/23/2017   HCT 35.0 01/21/2018   MCV 89.5 01/21/2018   PLT 224 01/21/2018   Lab Results  Component Value Date   FERRITIN 190 01/21/2018   IRON 44 01/21/2018   TIBC 245 01/21/2018   UIBC 201 01/21/2018   IRONPCTSAT 18 (L) 01/21/2018   Lab Results  Component Value Date   RETICCTPCT 1.4 06/20/2011   RBC 3.91 01/21/2018   RETICCTABS 55.2 06/20/2011   No results found for: KPAFRELGTCHN, LAMBDASER, KAPLAMBRATIO No results found for: Osborne Casco Lab Results  Component Value Date   TOTALPROTELP 6.6 08/14/2010   ALBUMINELP 53.8 (L) 08/14/2010   A1GS 5.3 (H) 08/14/2010   A2GS 14.3 (H) 08/14/2010   BETS 5.8 08/14/2010   BETA2SER 6.3 08/14/2010   GAMS 14.5 08/14/2010   MSPIKE NOT DET 08/14/2010   SPEI * 08/14/2010     Chemistry      Component Value Date/Time   NA 143 01/21/2018 0955   NA 146 (H) 07/23/2017 0900   NA 144 02/18/2017 0901   K 3.6 01/21/2018 0955   K 3.7 07/23/2017 0900   K 3.7 02/18/2017 0901   CL 106 01/21/2018 0955   CL 107 07/23/2017 0900   CO2 27 01/21/2018 0955   CO2 31 07/23/2017 0900   CO2 27 02/18/2017 0901   BUN 18 01/21/2018 0955   BUN 16 07/23/2017 0900   BUN 17.7 02/18/2017 0901    CREATININE 0.78 01/21/2018 0955   CREATININE 0.8 07/23/2017 0900   CREATININE 0.7 02/18/2017 0901      Component Value Date/Time   CALCIUM 9.5 01/21/2018 0955   CALCIUM 9.3 07/23/2017 0900   CALCIUM 9.3 02/18/2017 0901   ALKPHOS 82 01/21/2018 0955   ALKPHOS 67 07/23/2017 0900   ALKPHOS 78 02/18/2017 0901   AST 17 01/21/2018 0955   AST 13 02/18/2017 0901   ALT 18 01/21/2018 0955   ALT 20 07/23/2017 0900   ALT 12 02/18/2017 0901   BILITOT 0.4 01/21/2018 0955   BILITOT 0.54 02/18/2017 0901  Impression and Plan: [name] is a very pleasant 67 yo caucasian female with history of locally recurrent adenocarcinoma of the right breast with resection and radiation. She is doing well on Aromasin and so far there has been no evidence of recurrence.  CA 27.29 is pending.  We will continue to follow along with her and plan to see her back in another 6 months.  She will contact our office with any questions or concerns. We can certainly see her sooner if need be.   Laverna Peace, NP 4/11/20199:08 PM

## 2018-01-22 LAB — CANCER ANTIGEN 27.29: CA 27.29: 19.1 U/mL (ref 0.0–38.6)

## 2018-01-25 DIAGNOSIS — Z01818 Encounter for other preprocedural examination: Secondary | ICD-10-CM | POA: Diagnosis not present

## 2018-01-25 DIAGNOSIS — Z6836 Body mass index (BMI) 36.0-36.9, adult: Secondary | ICD-10-CM | POA: Diagnosis not present

## 2018-01-25 DIAGNOSIS — I10 Essential (primary) hypertension: Secondary | ICD-10-CM | POA: Diagnosis not present

## 2018-01-25 DIAGNOSIS — M48061 Spinal stenosis, lumbar region without neurogenic claudication: Secondary | ICD-10-CM | POA: Diagnosis not present

## 2018-02-01 ENCOUNTER — Other Ambulatory Visit: Payer: Self-pay | Admitting: Family

## 2018-02-01 DIAGNOSIS — D509 Iron deficiency anemia, unspecified: Secondary | ICD-10-CM | POA: Insufficient documentation

## 2018-02-01 DIAGNOSIS — D508 Other iron deficiency anemias: Secondary | ICD-10-CM

## 2018-02-01 HISTORY — DX: Iron deficiency anemia, unspecified: D50.9

## 2018-02-05 DIAGNOSIS — I2699 Other pulmonary embolism without acute cor pulmonale: Secondary | ICD-10-CM | POA: Diagnosis not present

## 2018-02-05 DIAGNOSIS — M48062 Spinal stenosis, lumbar region with neurogenic claudication: Secondary | ICD-10-CM | POA: Diagnosis not present

## 2018-02-05 DIAGNOSIS — M4716 Other spondylosis with myelopathy, lumbar region: Secondary | ICD-10-CM | POA: Diagnosis not present

## 2018-02-05 DIAGNOSIS — I82409 Acute embolism and thrombosis of unspecified deep veins of unspecified lower extremity: Secondary | ICD-10-CM | POA: Diagnosis not present

## 2018-02-05 DIAGNOSIS — M79604 Pain in right leg: Secondary | ICD-10-CM | POA: Diagnosis not present

## 2018-02-05 DIAGNOSIS — M7989 Other specified soft tissue disorders: Secondary | ICD-10-CM | POA: Diagnosis not present

## 2018-02-05 DIAGNOSIS — M47896 Other spondylosis, lumbar region: Secondary | ICD-10-CM | POA: Diagnosis not present

## 2018-02-05 DIAGNOSIS — Z4689 Encounter for fitting and adjustment of other specified devices: Secondary | ICD-10-CM | POA: Diagnosis not present

## 2018-02-08 DIAGNOSIS — M7989 Other specified soft tissue disorders: Secondary | ICD-10-CM | POA: Diagnosis not present

## 2018-02-08 DIAGNOSIS — Z6835 Body mass index (BMI) 35.0-35.9, adult: Secondary | ICD-10-CM | POA: Diagnosis not present

## 2018-02-09 ENCOUNTER — Inpatient Hospital Stay: Payer: Medicare Other

## 2018-02-09 VITALS — BP 148/78 | HR 72 | Temp 98.0°F | Resp 16

## 2018-02-09 DIAGNOSIS — I890 Lymphedema, not elsewhere classified: Secondary | ICD-10-CM | POA: Diagnosis not present

## 2018-02-09 DIAGNOSIS — Z923 Personal history of irradiation: Secondary | ICD-10-CM | POA: Diagnosis not present

## 2018-02-09 DIAGNOSIS — Z79811 Long term (current) use of aromatase inhibitors: Secondary | ICD-10-CM | POA: Diagnosis not present

## 2018-02-09 DIAGNOSIS — D508 Other iron deficiency anemias: Secondary | ICD-10-CM

## 2018-02-09 DIAGNOSIS — C50911 Malignant neoplasm of unspecified site of right female breast: Secondary | ICD-10-CM | POA: Diagnosis not present

## 2018-02-09 MED ORDER — SODIUM CHLORIDE 0.9 % IV SOLN
Freq: Once | INTRAVENOUS | Status: AC
Start: 2018-02-09 — End: 2018-02-09
  Administered 2018-02-09: 12:00:00 via INTRAVENOUS

## 2018-02-09 MED ORDER — SODIUM CHLORIDE 0.9 % IV SOLN
510.0000 mg | Freq: Once | INTRAVENOUS | Status: AC
  Administered 2018-02-09: 510 mg via INTRAVENOUS
  Filled 2018-02-09: qty 17

## 2018-02-09 NOTE — Patient Instructions (Signed)

## 2018-02-10 DIAGNOSIS — M7989 Other specified soft tissue disorders: Secondary | ICD-10-CM | POA: Diagnosis not present

## 2018-02-12 DIAGNOSIS — M7989 Other specified soft tissue disorders: Secondary | ICD-10-CM | POA: Diagnosis not present

## 2018-02-12 DIAGNOSIS — R7989 Other specified abnormal findings of blood chemistry: Secondary | ICD-10-CM | POA: Diagnosis not present

## 2018-02-12 DIAGNOSIS — R600 Localized edema: Secondary | ICD-10-CM | POA: Diagnosis not present

## 2018-02-24 ENCOUNTER — Other Ambulatory Visit: Payer: Self-pay

## 2018-02-24 DIAGNOSIS — R600 Localized edema: Secondary | ICD-10-CM

## 2018-03-10 DIAGNOSIS — R1030 Lower abdominal pain, unspecified: Secondary | ICD-10-CM | POA: Diagnosis not present

## 2018-03-10 DIAGNOSIS — Z139 Encounter for screening, unspecified: Secondary | ICD-10-CM | POA: Diagnosis not present

## 2018-03-10 DIAGNOSIS — R609 Edema, unspecified: Secondary | ICD-10-CM | POA: Diagnosis not present

## 2018-03-10 DIAGNOSIS — Z6835 Body mass index (BMI) 35.0-35.9, adult: Secondary | ICD-10-CM | POA: Diagnosis not present

## 2018-03-12 DIAGNOSIS — K5730 Diverticulosis of large intestine without perforation or abscess without bleeding: Secondary | ICD-10-CM | POA: Diagnosis not present

## 2018-03-12 DIAGNOSIS — R1030 Lower abdominal pain, unspecified: Secondary | ICD-10-CM | POA: Diagnosis not present

## 2018-03-23 DIAGNOSIS — G8929 Other chronic pain: Secondary | ICD-10-CM | POA: Diagnosis not present

## 2018-03-23 DIAGNOSIS — M25571 Pain in right ankle and joints of right foot: Secondary | ICD-10-CM | POA: Diagnosis not present

## 2018-03-24 DIAGNOSIS — I700 Atherosclerosis of aorta: Secondary | ICD-10-CM | POA: Diagnosis not present

## 2018-03-24 DIAGNOSIS — R600 Localized edema: Secondary | ICD-10-CM | POA: Diagnosis not present

## 2018-03-24 DIAGNOSIS — M169 Osteoarthritis of hip, unspecified: Secondary | ICD-10-CM | POA: Diagnosis not present

## 2018-03-24 DIAGNOSIS — M4716 Other spondylosis with myelopathy, lumbar region: Secondary | ICD-10-CM | POA: Diagnosis not present

## 2018-03-25 ENCOUNTER — Other Ambulatory Visit: Payer: Self-pay | Admitting: Hematology & Oncology

## 2018-03-25 DIAGNOSIS — Z1231 Encounter for screening mammogram for malignant neoplasm of breast: Secondary | ICD-10-CM

## 2018-03-31 ENCOUNTER — Other Ambulatory Visit: Payer: Self-pay | Admitting: Hematology & Oncology

## 2018-04-13 ENCOUNTER — Encounter

## 2018-04-13 ENCOUNTER — Ambulatory Visit (INDEPENDENT_AMBULATORY_CARE_PROVIDER_SITE_OTHER): Payer: Medicare Other | Admitting: Vascular Surgery

## 2018-04-13 ENCOUNTER — Encounter: Payer: Self-pay | Admitting: Vascular Surgery

## 2018-04-13 ENCOUNTER — Ambulatory Visit (HOSPITAL_COMMUNITY)
Admission: RE | Admit: 2018-04-13 | Discharge: 2018-04-13 | Disposition: A | Discharge status: Home / Self Care | Payer: Medicare Other | Source: Ambulatory Visit | Attending: Vascular Surgery | Admitting: Vascular Surgery

## 2018-04-13 ENCOUNTER — Other Ambulatory Visit: Payer: Self-pay

## 2018-04-13 VITALS — BP 156/74 | HR 79 | Resp 18 | Ht 63.0 in | Wt 200.0 lb

## 2018-04-13 DIAGNOSIS — I8391 Asymptomatic varicose veins of right lower extremity: Secondary | ICD-10-CM | POA: Insufficient documentation

## 2018-04-13 DIAGNOSIS — M7989 Other specified soft tissue disorders: Secondary | ICD-10-CM | POA: Diagnosis not present

## 2018-04-13 DIAGNOSIS — R600 Localized edema: Secondary | ICD-10-CM | POA: Diagnosis not present

## 2018-04-13 NOTE — Progress Notes (Signed)
Vascular and Vein Specialist of [facility]  Patient name: [name] MRN: [id number] DOB: 10-25-1950 Sex: female  REASON FOR CONSULT: Evaluation swelling right leg  HPI: [name] is a 67 y.o. female, who is here today for evaluation.  Her husband is here with her.  She has a history of progressive swelling over the last 1 year.  She was being evaluated for potential right hip or back surgery and was told by her surgeon that he was concerned about DVT in the right leg and would prefer to have this evaluated first.  She had a venous rule out DVT study [place] in May and this showed no evidence of DVT.  She does have a remote history of superficial thrombophlebitis in her right thigh.  She has had progressive changes of swelling and discomfort over her pretibial area with skin thickening as well.  She does have history of right breast cancer with mastectomy and axillary dissection and does have lymphedema in her right arm.  Has no history of tissue loss and no history of arterial insufficiency  Past Medical History:  Diagnosis Date  . Anemia   . Arthritis   . Blood transfusion   . Cancer [facility]) 2012   right breast  . DVT (deep venous thrombosis) ([place])   . Hyperlipidemia   . Hypertension   . Leg swelling     Family History  Problem Relation Age of Onset  . Cancer Father 45       lung cancer     SOCIAL HISTORY: Social History   Socioeconomic History  . Marital status: Married    Spouse name: Not on file  . Number of children: Not on file  . Years of education: Not on file  . Highest education level: Not on file  Occupational History  . Not on file  Social Needs  . Financial resource strain: Not on file  . Food insecurity:    Worry: Not on file    Inability: Not on file  . Transportation needs:    Medical: Not on file    Non-medical: Not on file  Tobacco Use  . Smoking status: Never Smoker  . Smokeless tobacco: Never Used  . Tobacco  comment: never used tobacco  Substance and Sexual Activity  . Alcohol use: No    Alcohol/week: 0.0 oz  . Drug use: No  . Sexual activity: Not on file  Lifestyle  . Physical activity:    Days per week: Not on file    Minutes per session: Not on file  . Stress: Not on file  Relationships  . Social connections:    Talks on phone: Not on file    Gets together: Not on file    Attends religious service: Not on file    Active member of club or organization: Not on file    Attends meetings of clubs or organizations: Not on file    Relationship status: Not on file  . Intimate partner violence:    Fear of current or ex partner: Not on file    Emotionally abused: Not on file    Physically abused: Not on file    Forced sexual activity: Not on file  Other Topics Concern  . Not on file  Social History Narrative  . Not on file    Allergies  Allergen Reactions  . Contrast Media [Iodinated Diagnostic Agents] Hives, Shortness Of Breath and Nausea And Vomiting    Allergic reaction even after pre-meds.  Marland Kitchen  Metrizamide Nausea And Vomiting, Shortness Of Breath and Hives    Allergic reaction even after pre-meds.  . Other Rash and Shortness Of Breath    Other reaction(s): Nausea And Vomiting Allergic reaction even after pre-meds.  Dillard [place] Blue Fcf Other (See Comments)    Gives pt hives and causes nausea  CT scan dye  . Dye Fdc Blue [Brilliant Blue Fcf (Fd&C Blue #1)]     Gives pt hives and causes nausea  CT scan dye    Current Outpatient Medications  Medication Sig Dispense Refill  . aspirin EC 81 MG tablet Take 81 mg by mouth daily.    Marland Kitchen exemestane (AROMASIN) 25 MG tablet TAKE 1 TABLET BY MOUTH EVERY DAY 30 tablet 11  . hydrochlorothiazide 25 MG tablet Take 25 mg by mouth daily.     Marland Kitchen HYDROcodone-acetaminophen (NORCO/VICODIN) 5-325 MG tablet Take by mouth as needed.  0  . ibuprofen (ADVIL,MOTRIN) 200 MG tablet Take 200 mg by mouth every 6 (six) hours as needed.    . simvastatin  (ZOCOR) 20 MG tablet Take 20 mg by mouth daily.  4  . Telmisartan-Amlodipine 80-5 MG TABS Take 1 tablet by mouth daily.  3  . Vitamin D, Ergocalciferol, (DRISDOL) 50000 units CAPS capsule Take 1 capsule (50,000 Units total) by mouth once a week. 16 capsule 2   No current facility-administered medications for this visit.     REVIEW OF SYSTEMS:  [X]  denotes positive finding, [ ]  denotes negative finding Cardiac  Comments:  Chest pain or chest pressure:    Shortness of breath upon exertion: x   Short of breath when lying flat:    Irregular heart rhythm:        Vascular    Pain in calf, thigh, or hip brought on by ambulation: x   Pain in feet at night that wakes you up from your sleep:     Blood clot in your veins:    Leg swelling:  x       Pulmonary    Oxygen at home:    Productive cough:     Wheezing:         Neurologic    Sudden weakness in arms or legs:     Sudden numbness in arms or legs:     Sudden onset of difficulty speaking or slurred speech:    Temporary loss of vision in one eye:     Problems with dizziness:         Gastrointestinal    Blood in stool:     Vomited blood:         Genitourinary    Burning when urinating:     Blood in urine:        Psychiatric    Major depression:         Hematologic    Bleeding problems:    Problems with blood clotting too easily:        Skin    Rashes or ulcers:        Constitutional    Fever or chills:      PHYSICAL EXAM: Vitals:   04/13/18 1407 04/13/18 1408  BP: (!) 158/78 (!) 156/74  Pulse: 79   Resp: 18   SpO2: 98%   Weight: 200 lb (90.7 kg)   Height: 5\' 3"  (1.6 m)     GENERAL: The patient is a well-nourished female, in no acute distress. The vital signs are documented above. CARDIOVASCULAR: 2+ radial pulse.  She does  have thickening in the dorsum of her left foot but has easily palpable posterior tibial pulse on the right.  2+ dorsalis pedis pulse on the left PULMONARY: There is good air exchange    ABDOMEN: Soft and non-tender  MUSCULOSKELETAL: There are no major deformities or cyanosis. NEUROLOGIC: No focal weakness or paresthesias are detected. SKIN: There are no ulcers or rashes noted.  Mild erythema and thickening from her mid calf down onto the dorsum of her foot with no particular swelling in her right foot PSYCHIATRIC: The patient has a normal affect.  DATA:  Reflux study in our office today shows reflux in her common femoral vein and also at the saphenofemoral junction.  No other evidence of saphenous vein reflux and no evidence of superficial or deep venous thrombosis  MEDICAL ISSUES: Had a long discussion with the patient and her husband present.  I do not see any evidence of correctable cause for her venous hypertension.  This is probably a combination of the varicosities and her deep venous incompetence.  I explained that this is not limb threatening and not life-threatening.  I have recommended that she begin wearing knee-high 20 to 30 mmHg compression stockings for control of the swelling.  I do not see any chondral indication from an arterial or venous standpoint for hip or back surgery as indicated.  She was reassured with this discussion will see [place] again on an as-needed basis   Rosetta Posner, MD [facility] Vascular and Vein Specialists of [facility] Tel [phone] Pager [phone]

## 2018-04-19 ENCOUNTER — Encounter: Payer: Self-pay | Admitting: Family Medicine

## 2018-05-05 ENCOUNTER — Ambulatory Visit
Admission: RE | Admit: 2018-05-05 | Discharge: 2018-05-05 | Disposition: A | Discharge status: Home / Self Care | Payer: Medicare Other | Source: Ambulatory Visit | Attending: Hematology & Oncology | Admitting: Hematology & Oncology

## 2018-05-05 DIAGNOSIS — Z1231 Encounter for screening mammogram for malignant neoplasm of breast: Secondary | ICD-10-CM | POA: Diagnosis not present

## 2018-05-26 DIAGNOSIS — M25551 Pain in right hip: Secondary | ICD-10-CM | POA: Diagnosis not present

## 2018-05-26 DIAGNOSIS — Z6834 Body mass index (BMI) 34.0-34.9, adult: Secondary | ICD-10-CM | POA: Diagnosis not present

## 2018-05-26 DIAGNOSIS — M4716 Other spondylosis with myelopathy, lumbar region: Secondary | ICD-10-CM | POA: Diagnosis not present

## 2018-05-26 DIAGNOSIS — M48062 Spinal stenosis, lumbar region with neurogenic claudication: Secondary | ICD-10-CM | POA: Diagnosis not present

## 2018-06-09 DIAGNOSIS — Z96642 Presence of left artificial hip joint: Secondary | ICD-10-CM | POA: Diagnosis not present

## 2018-06-09 DIAGNOSIS — M25552 Pain in left hip: Secondary | ICD-10-CM | POA: Diagnosis not present

## 2018-06-09 DIAGNOSIS — R739 Hyperglycemia, unspecified: Secondary | ICD-10-CM | POA: Diagnosis not present

## 2018-06-09 DIAGNOSIS — M1611 Unilateral primary osteoarthritis, right hip: Secondary | ICD-10-CM | POA: Diagnosis not present

## 2018-06-09 DIAGNOSIS — M25551 Pain in right hip: Secondary | ICD-10-CM | POA: Diagnosis not present

## 2018-06-11 DIAGNOSIS — E782 Mixed hyperlipidemia: Secondary | ICD-10-CM | POA: Diagnosis not present

## 2018-06-11 DIAGNOSIS — I700 Atherosclerosis of aorta: Secondary | ICD-10-CM | POA: Diagnosis not present

## 2018-06-11 DIAGNOSIS — R000 Tachycardia, unspecified: Secondary | ICD-10-CM | POA: Diagnosis not present

## 2018-06-11 DIAGNOSIS — I10 Essential (primary) hypertension: Secondary | ICD-10-CM | POA: Diagnosis not present

## 2018-06-15 DIAGNOSIS — R739 Hyperglycemia, unspecified: Secondary | ICD-10-CM | POA: Diagnosis not present

## 2018-06-15 DIAGNOSIS — M1611 Unilateral primary osteoarthritis, right hip: Secondary | ICD-10-CM | POA: Diagnosis not present

## 2018-06-21 DIAGNOSIS — Z01818 Encounter for other preprocedural examination: Secondary | ICD-10-CM | POA: Diagnosis not present

## 2018-06-22 ENCOUNTER — Other Ambulatory Visit: Payer: Self-pay | Admitting: Hematology & Oncology

## 2018-06-23 DIAGNOSIS — M545 Low back pain: Secondary | ICD-10-CM | POA: Diagnosis not present

## 2018-06-23 DIAGNOSIS — M25551 Pain in right hip: Secondary | ICD-10-CM | POA: Diagnosis not present

## 2018-06-23 DIAGNOSIS — M48062 Spinal stenosis, lumbar region with neurogenic claudication: Secondary | ICD-10-CM | POA: Diagnosis not present

## 2018-06-28 DIAGNOSIS — R000 Tachycardia, unspecified: Secondary | ICD-10-CM | POA: Diagnosis not present

## 2018-06-28 DIAGNOSIS — Z6835 Body mass index (BMI) 35.0-35.9, adult: Secondary | ICD-10-CM | POA: Diagnosis not present

## 2018-06-28 DIAGNOSIS — I10 Essential (primary) hypertension: Secondary | ICD-10-CM | POA: Diagnosis not present

## 2018-06-28 DIAGNOSIS — Z139 Encounter for screening, unspecified: Secondary | ICD-10-CM | POA: Diagnosis not present

## 2018-06-29 DIAGNOSIS — R29898 Other symptoms and signs involving the musculoskeletal system: Secondary | ICD-10-CM | POA: Diagnosis not present

## 2018-06-29 DIAGNOSIS — Z7409 Other reduced mobility: Secondary | ICD-10-CM | POA: Diagnosis not present

## 2018-06-29 DIAGNOSIS — M25651 Stiffness of right hip, not elsewhere classified: Secondary | ICD-10-CM | POA: Diagnosis not present

## 2018-06-29 DIAGNOSIS — M1611 Unilateral primary osteoarthritis, right hip: Secondary | ICD-10-CM | POA: Diagnosis not present

## 2018-06-29 DIAGNOSIS — R269 Unspecified abnormalities of gait and mobility: Secondary | ICD-10-CM | POA: Diagnosis not present

## 2018-06-29 DIAGNOSIS — R739 Hyperglycemia, unspecified: Secondary | ICD-10-CM | POA: Diagnosis not present

## 2018-07-05 DIAGNOSIS — I10 Essential (primary) hypertension: Secondary | ICD-10-CM | POA: Diagnosis present

## 2018-07-05 DIAGNOSIS — Z8249 Family history of ischemic heart disease and other diseases of the circulatory system: Secondary | ICD-10-CM | POA: Diagnosis not present

## 2018-07-05 DIAGNOSIS — Z79899 Other long term (current) drug therapy: Secondary | ICD-10-CM | POA: Diagnosis not present

## 2018-07-05 DIAGNOSIS — Z471 Aftercare following joint replacement surgery: Secondary | ICD-10-CM | POA: Diagnosis not present

## 2018-07-05 DIAGNOSIS — Z96641 Presence of right artificial hip joint: Secondary | ICD-10-CM | POA: Diagnosis not present

## 2018-07-05 DIAGNOSIS — Z823 Family history of stroke: Secondary | ICD-10-CM | POA: Diagnosis not present

## 2018-07-05 DIAGNOSIS — Z809 Family history of malignant neoplasm, unspecified: Secondary | ICD-10-CM | POA: Diagnosis not present

## 2018-07-05 DIAGNOSIS — M1611 Unilateral primary osteoarthritis, right hip: Secondary | ICD-10-CM | POA: Diagnosis present

## 2018-07-05 DIAGNOSIS — E7800 Pure hypercholesterolemia, unspecified: Secondary | ICD-10-CM | POA: Diagnosis present

## 2018-07-05 DIAGNOSIS — Z7982 Long term (current) use of aspirin: Secondary | ICD-10-CM | POA: Diagnosis not present

## 2018-07-08 DIAGNOSIS — Z96641 Presence of right artificial hip joint: Secondary | ICD-10-CM | POA: Diagnosis not present

## 2018-07-08 DIAGNOSIS — Z471 Aftercare following joint replacement surgery: Secondary | ICD-10-CM | POA: Diagnosis not present

## 2018-07-08 DIAGNOSIS — Z7982 Long term (current) use of aspirin: Secondary | ICD-10-CM | POA: Diagnosis not present

## 2018-07-08 DIAGNOSIS — Z853 Personal history of malignant neoplasm of breast: Secondary | ICD-10-CM | POA: Diagnosis not present

## 2018-07-08 DIAGNOSIS — Z9181 History of falling: Secondary | ICD-10-CM | POA: Diagnosis not present

## 2018-07-08 DIAGNOSIS — I10 Essential (primary) hypertension: Secondary | ICD-10-CM | POA: Diagnosis not present

## 2018-07-08 DIAGNOSIS — I890 Lymphedema, not elsewhere classified: Secondary | ICD-10-CM | POA: Diagnosis not present

## 2018-07-08 DIAGNOSIS — M150 Primary generalized (osteo)arthritis: Secondary | ICD-10-CM | POA: Diagnosis not present

## 2018-07-08 DIAGNOSIS — Z9011 Acquired absence of right breast and nipple: Secondary | ICD-10-CM | POA: Diagnosis not present

## 2018-07-09 DIAGNOSIS — M150 Primary generalized (osteo)arthritis: Secondary | ICD-10-CM | POA: Diagnosis not present

## 2018-07-09 DIAGNOSIS — Z96641 Presence of right artificial hip joint: Secondary | ICD-10-CM | POA: Diagnosis not present

## 2018-07-09 DIAGNOSIS — I10 Essential (primary) hypertension: Secondary | ICD-10-CM | POA: Diagnosis not present

## 2018-07-09 DIAGNOSIS — I890 Lymphedema, not elsewhere classified: Secondary | ICD-10-CM | POA: Diagnosis not present

## 2018-07-09 DIAGNOSIS — Z7982 Long term (current) use of aspirin: Secondary | ICD-10-CM | POA: Diagnosis not present

## 2018-07-09 DIAGNOSIS — Z471 Aftercare following joint replacement surgery: Secondary | ICD-10-CM | POA: Diagnosis not present

## 2018-07-12 DIAGNOSIS — M150 Primary generalized (osteo)arthritis: Secondary | ICD-10-CM | POA: Diagnosis not present

## 2018-07-12 DIAGNOSIS — Z471 Aftercare following joint replacement surgery: Secondary | ICD-10-CM | POA: Diagnosis not present

## 2018-07-12 DIAGNOSIS — Z7982 Long term (current) use of aspirin: Secondary | ICD-10-CM | POA: Diagnosis not present

## 2018-07-12 DIAGNOSIS — I890 Lymphedema, not elsewhere classified: Secondary | ICD-10-CM | POA: Diagnosis not present

## 2018-07-12 DIAGNOSIS — Z96641 Presence of right artificial hip joint: Secondary | ICD-10-CM | POA: Diagnosis not present

## 2018-07-12 DIAGNOSIS — I10 Essential (primary) hypertension: Secondary | ICD-10-CM | POA: Diagnosis not present

## 2018-07-14 DIAGNOSIS — I10 Essential (primary) hypertension: Secondary | ICD-10-CM | POA: Diagnosis not present

## 2018-07-14 DIAGNOSIS — I890 Lymphedema, not elsewhere classified: Secondary | ICD-10-CM | POA: Diagnosis not present

## 2018-07-14 DIAGNOSIS — M150 Primary generalized (osteo)arthritis: Secondary | ICD-10-CM | POA: Diagnosis not present

## 2018-07-14 DIAGNOSIS — Z7982 Long term (current) use of aspirin: Secondary | ICD-10-CM | POA: Diagnosis not present

## 2018-07-14 DIAGNOSIS — Z96641 Presence of right artificial hip joint: Secondary | ICD-10-CM | POA: Diagnosis not present

## 2018-07-14 DIAGNOSIS — Z471 Aftercare following joint replacement surgery: Secondary | ICD-10-CM | POA: Diagnosis not present

## 2018-07-15 DIAGNOSIS — I890 Lymphedema, not elsewhere classified: Secondary | ICD-10-CM | POA: Diagnosis not present

## 2018-07-15 DIAGNOSIS — Z96641 Presence of right artificial hip joint: Secondary | ICD-10-CM | POA: Diagnosis not present

## 2018-07-15 DIAGNOSIS — M150 Primary generalized (osteo)arthritis: Secondary | ICD-10-CM | POA: Diagnosis not present

## 2018-07-15 DIAGNOSIS — I10 Essential (primary) hypertension: Secondary | ICD-10-CM | POA: Diagnosis not present

## 2018-07-15 DIAGNOSIS — Z471 Aftercare following joint replacement surgery: Secondary | ICD-10-CM | POA: Diagnosis not present

## 2018-07-16 DIAGNOSIS — Z96641 Presence of right artificial hip joint: Secondary | ICD-10-CM | POA: Diagnosis not present

## 2018-07-16 DIAGNOSIS — Z7982 Long term (current) use of aspirin: Secondary | ICD-10-CM | POA: Diagnosis not present

## 2018-07-16 DIAGNOSIS — M150 Primary generalized (osteo)arthritis: Secondary | ICD-10-CM | POA: Diagnosis not present

## 2018-07-16 DIAGNOSIS — I890 Lymphedema, not elsewhere classified: Secondary | ICD-10-CM | POA: Diagnosis not present

## 2018-07-16 DIAGNOSIS — I10 Essential (primary) hypertension: Secondary | ICD-10-CM | POA: Diagnosis not present

## 2018-07-16 DIAGNOSIS — Z471 Aftercare following joint replacement surgery: Secondary | ICD-10-CM | POA: Diagnosis not present

## 2018-07-19 DIAGNOSIS — I10 Essential (primary) hypertension: Secondary | ICD-10-CM | POA: Diagnosis not present

## 2018-07-19 DIAGNOSIS — M150 Primary generalized (osteo)arthritis: Secondary | ICD-10-CM | POA: Diagnosis not present

## 2018-07-19 DIAGNOSIS — I890 Lymphedema, not elsewhere classified: Secondary | ICD-10-CM | POA: Diagnosis not present

## 2018-07-19 DIAGNOSIS — Z96641 Presence of right artificial hip joint: Secondary | ICD-10-CM | POA: Diagnosis not present

## 2018-07-19 DIAGNOSIS — Z7982 Long term (current) use of aspirin: Secondary | ICD-10-CM | POA: Diagnosis not present

## 2018-07-19 DIAGNOSIS — Z471 Aftercare following joint replacement surgery: Secondary | ICD-10-CM | POA: Diagnosis not present

## 2018-07-22 DIAGNOSIS — I10 Essential (primary) hypertension: Secondary | ICD-10-CM | POA: Diagnosis not present

## 2018-07-22 DIAGNOSIS — I890 Lymphedema, not elsewhere classified: Secondary | ICD-10-CM | POA: Diagnosis not present

## 2018-07-22 DIAGNOSIS — Z7982 Long term (current) use of aspirin: Secondary | ICD-10-CM | POA: Diagnosis not present

## 2018-07-22 DIAGNOSIS — Z471 Aftercare following joint replacement surgery: Secondary | ICD-10-CM | POA: Diagnosis not present

## 2018-07-22 DIAGNOSIS — Z96641 Presence of right artificial hip joint: Secondary | ICD-10-CM | POA: Diagnosis not present

## 2018-07-22 DIAGNOSIS — M150 Primary generalized (osteo)arthritis: Secondary | ICD-10-CM | POA: Diagnosis not present

## 2018-07-23 ENCOUNTER — Ambulatory Visit: Payer: Medicare Other | Admitting: Hematology

## 2018-07-23 ENCOUNTER — Other Ambulatory Visit: Payer: Medicare Other

## 2018-07-26 DIAGNOSIS — Z471 Aftercare following joint replacement surgery: Secondary | ICD-10-CM | POA: Diagnosis not present

## 2018-07-26 DIAGNOSIS — I10 Essential (primary) hypertension: Secondary | ICD-10-CM | POA: Diagnosis not present

## 2018-07-26 DIAGNOSIS — M150 Primary generalized (osteo)arthritis: Secondary | ICD-10-CM | POA: Diagnosis not present

## 2018-07-26 DIAGNOSIS — Z96641 Presence of right artificial hip joint: Secondary | ICD-10-CM | POA: Diagnosis not present

## 2018-07-26 DIAGNOSIS — Z7982 Long term (current) use of aspirin: Secondary | ICD-10-CM | POA: Diagnosis not present

## 2018-07-26 DIAGNOSIS — I890 Lymphedema, not elsewhere classified: Secondary | ICD-10-CM | POA: Diagnosis not present

## 2018-08-17 DIAGNOSIS — I10 Essential (primary) hypertension: Secondary | ICD-10-CM | POA: Diagnosis not present

## 2018-08-17 DIAGNOSIS — E782 Mixed hyperlipidemia: Secondary | ICD-10-CM | POA: Diagnosis not present

## 2018-08-23 DIAGNOSIS — I10 Essential (primary) hypertension: Secondary | ICD-10-CM | POA: Diagnosis not present

## 2018-08-23 DIAGNOSIS — Z23 Encounter for immunization: Secondary | ICD-10-CM | POA: Diagnosis not present

## 2018-08-23 DIAGNOSIS — Z139 Encounter for screening, unspecified: Secondary | ICD-10-CM | POA: Diagnosis not present

## 2018-08-23 DIAGNOSIS — E782 Mixed hyperlipidemia: Secondary | ICD-10-CM | POA: Diagnosis not present

## 2018-09-01 ENCOUNTER — Other Ambulatory Visit: Payer: Self-pay

## 2018-09-06 ENCOUNTER — Inpatient Hospital Stay: Payer: Medicare Other | Attending: Hematology & Oncology

## 2018-09-06 ENCOUNTER — Inpatient Hospital Stay (HOSPITAL_BASED_OUTPATIENT_CLINIC_OR_DEPARTMENT_OTHER): Payer: Medicare Other | Admitting: Hematology & Oncology

## 2018-09-06 ENCOUNTER — Encounter: Payer: Self-pay | Admitting: Hematology & Oncology

## 2018-09-06 ENCOUNTER — Other Ambulatory Visit: Payer: Self-pay

## 2018-09-06 VITALS — BP 171/63 | HR 81 | Temp 98.6°F | Resp 18 | Wt 201.0 lb

## 2018-09-06 DIAGNOSIS — C50011 Malignant neoplasm of nipple and areola, right female breast: Secondary | ICD-10-CM

## 2018-09-06 DIAGNOSIS — Z79811 Long term (current) use of aromatase inhibitors: Secondary | ICD-10-CM | POA: Insufficient documentation

## 2018-09-06 DIAGNOSIS — D509 Iron deficiency anemia, unspecified: Secondary | ICD-10-CM | POA: Diagnosis not present

## 2018-09-06 DIAGNOSIS — Z9011 Acquired absence of right breast and nipple: Secondary | ICD-10-CM

## 2018-09-06 DIAGNOSIS — Z923 Personal history of irradiation: Secondary | ICD-10-CM

## 2018-09-06 DIAGNOSIS — C50911 Malignant neoplasm of unspecified site of right female breast: Secondary | ICD-10-CM | POA: Diagnosis not present

## 2018-09-06 DIAGNOSIS — Z86718 Personal history of other venous thrombosis and embolism: Secondary | ICD-10-CM | POA: Insufficient documentation

## 2018-09-06 DIAGNOSIS — Z170 Estrogen receptor positive status [ER+]: Secondary | ICD-10-CM

## 2018-09-06 DIAGNOSIS — D508 Other iron deficiency anemias: Secondary | ICD-10-CM

## 2018-09-06 DIAGNOSIS — Z7982 Long term (current) use of aspirin: Secondary | ICD-10-CM | POA: Insufficient documentation

## 2018-09-06 DIAGNOSIS — Z79899 Other long term (current) drug therapy: Secondary | ICD-10-CM | POA: Diagnosis not present

## 2018-09-06 DIAGNOSIS — D500 Iron deficiency anemia secondary to blood loss (chronic): Secondary | ICD-10-CM

## 2018-09-06 LAB — CMP (CANCER CENTER ONLY)
ALBUMIN: 3.8 g/dL (ref 3.5–5.0)
ALK PHOS: 85 U/L (ref 38–126)
ALT: 12 U/L (ref 0–44)
AST: 16 U/L (ref 15–41)
Anion gap: 10 (ref 5–15)
BILIRUBIN TOTAL: 0.4 mg/dL (ref 0.3–1.2)
BUN: 16 mg/dL (ref 8–23)
CO2: 28 mmol/L (ref 22–32)
CREATININE: 0.74 mg/dL (ref 0.44–1.00)
Calcium: 9.5 mg/dL (ref 8.9–10.3)
Chloride: 107 mmol/L (ref 98–111)
GFR, Est AFR Am: >60 mL/min (ref >60)
GFR, Estimated: >60 mL/min (ref >60)
GLUCOSE: 99 mg/dL (ref 70–99)
POTASSIUM: 4.3 mmol/L (ref 3.5–5.1)
Sodium: 145 mmol/L (ref 135–145)
TOTAL PROTEIN: 7.7 g/dL (ref 6.5–8.1)

## 2018-09-06 LAB — CBC WITH DIFFERENTIAL (CANCER CENTER ONLY)
ABS IMMATURE GRANULOCYTES: 0.02 10*3/uL (ref 0.00–0.07)
Basophils Absolute: 0 10*3/uL (ref 0.0–0.1)
Basophils Relative: 0 %
EOS PCT: 3 %
Eosinophils Absolute: 0.2 10*3/uL (ref 0.0–0.5)
HCT: 35.6 % — ABNORMAL LOW (ref 36.0–46.0)
HEMOGLOBIN: 10.6 g/dL — AB (ref 12.0–15.0)
Immature Granulocytes: 0 %
Lymphocytes Relative: 32 %
Lymphs Abs: 1.9 10*3/uL (ref 0.7–4.0)
MCH: 27.3 pg (ref 26.0–34.0)
MCHC: 29.8 g/dL — ABNORMAL LOW (ref 30.0–36.0)
MCV: 91.8 fL (ref 80.0–100.0)
MONO ABS: 0.4 10*3/uL (ref 0.1–1.0)
MONOS PCT: 7 %
NEUTROS ABS: 3.4 10*3/uL (ref 1.7–7.7)
Neutrophils Relative %: 58 %
Platelet Count: 233 10*3/uL (ref 150–400)
RBC: 3.88 MIL/uL (ref 3.87–5.11)
RDW: 13.9 % (ref 11.5–15.5)
WBC Count: 6 10*3/uL (ref 4.0–10.5)
nRBC: 0 % (ref 0.0–0.2)

## 2018-09-06 LAB — IRON AND TIBC
Iron: 46 ug/dL (ref 41–142)
SATURATION RATIOS: 20 % — AB (ref 21–57)
TIBC: 233 ug/dL — ABNORMAL LOW (ref 236–444)
UIBC: 186 ug/dL (ref 120–384)

## 2018-09-06 LAB — FERRITIN: Ferritin: 294 ng/mL (ref 11–307)

## 2018-09-06 MED ORDER — FUSION PLUS PO CAPS: 1.0000 | ORAL_CAPSULE | Freq: Every day | ORAL | 12 refills | Status: DC

## 2018-09-06 NOTE — Progress Notes (Signed)
Hematology and Oncology Follow Up Visit  [name] [id number] 20-Oct-1950 67 y.o. 09/06/2018   Principle Diagnosis:  Locally recurrent adenocarcinoma of the right breast History of superficial venous thrombus of the right thigh  Current Therapy:   1. Aromasin 25 mg p.o. daily 2. Aspirin 81 mg p.o. daily   Interim History:  [name] is here today for follow-up.  She is doing better.  She is doing better because she has surgery for her right hip.  She had this done in September.  She actually is doing quite well.  Apparently, it was the anterior approach.  This seemed to agree with her.  She did not even need any physical therapy.  Her back is bothering her but not as bad.  She is having some problems with the radiation changes on the right chest wall.  Her skin keeps breaking down.  She put some aloe vera on the area of radiation dermatitis.  I am not sure what else could be done outside of a skin flap.  I might sure this would even be a good idea because of the difficulty with healing and with blood flow to any skin flap.  She is eating well.  She is looking forward to Thanksgiving.  There is no change in bowel or bladder habits.  More last saw her, we did give her some IV iron.  I think her iron is still on the low side.  She is still anemic.  I will call in some Fusion Plus she will take this daily.  Hopefully, this will get her hemoglobin back up a little bit.  Overall, her performance status is ECOG 1.    Medications:  Allergies as of 09/06/2018      Reactions   Contrast Media [iodinated Diagnostic Agents] Hives, Shortness Of Breath, Nausea And Vomiting   Allergic reaction even after pre-meds.   Metrizamide Nausea And Vomiting, Shortness Of Breath, Hives   Allergic reaction even after pre-meds.   Other Rash, Shortness Of Breath   Other reaction(s): Nausea And Vomiting Allergic reaction even after pre-meds.   Brilliant Blue Fcf Other (See Comments)   Gives pt hives and  causes nausea  CT scan dye   Dye Fdc Blue [brilliant Blue Fcf (fd&c Blue #1)]    Gives pt hives and causes nausea  CT scan dye      Medication List        Accurate as of 09/06/18 10:09 AM. Always use your most recent med list.          aspirin EC 81 MG tablet Take 81 mg by mouth daily.   exemestane 25 MG tablet Commonly known as:  AROMASIN TAKE 1 TABLET BY MOUTH EVERY DAY   hydrochlorothiazide 25 MG tablet Commonly known as:  HYDRODIURIL Take 25 mg by mouth daily.   ibuprofen 200 MG tablet Commonly known as:  ADVIL,MOTRIN Take 200 mg by mouth every 6 (six) hours as needed.   metoprolol succinate 25 MG 24 hr tablet Commonly known as:  TOPROL-XL Take 25 mg by mouth daily.   simvastatin 20 MG tablet Commonly known as:  ZOCOR Take 20 mg by mouth daily.   Telmisartan-amLODIPine 80-5 MG Tabs Take 1 tablet by mouth daily.   Vitamin D (Ergocalciferol) 1.25 MG (50000 UT) Caps capsule Commonly known as:  DRISDOL TAKE ONE CAPSULE BY MOUTH ONE TIME PER WEEK       Allergies:  Allergies  Allergen Reactions  . Contrast Media [Iodinated Diagnostic Agents] Hives,  Shortness Of Breath and Nausea And Vomiting    Allergic reaction even after pre-meds.  . Metrizamide Nausea And Vomiting, Shortness Of Breath and Hives    Allergic reaction even after pre-meds.  . Other Rash and Shortness Of Breath    Other reaction(s): Nausea And Vomiting Allergic reaction even after pre-meds.  Dillard [place] Blue Fcf Other (See Comments)    Gives pt hives and causes nausea  CT scan dye  . Dye Fdc Blue [Brilliant Blue Fcf (Fd&C Blue #1)]     Gives pt hives and causes nausea  CT scan dye    Past Medical History, Surgical history, Social history, and Family History were reviewed and updated.  Review of Systems: Review of Systems  Constitutional: Negative.   HENT: Negative.   Eyes: Negative.   Respiratory: Negative.   Cardiovascular: Negative.   Gastrointestinal: Negative.   Genitourinary:  Negative.   Musculoskeletal: Positive for back pain.  Skin: Positive for rash.  Neurological: Negative.   Endo/Heme/Allergies: Negative.   Psychiatric/Behavioral: Negative.      Physical Exam:  weight is 201 lb (91.2 kg). Her oral temperature is 98.6 F (37 C). Her blood pressure is 171/63 (abnormal) and her pulse is 81. Her respiration is 18 and oxygen saturation is 100%.   Wt Readings from Last 3 Encounters:  09/06/18 201 lb (91.2 kg)  04/13/18 200 lb (90.7 kg)  01/21/18 201 lb (91.2 kg)    Physical Exam  Constitutional: She is oriented to person, place, and time.  Right chest wall shows the mastectomy site.  She has radiation telangiectasias on the right anterior chest wall.  She has some areas of poor healing.  No bleeding is noted.  There is no exudate.  There is no fluid.  There is no right axillary adenopathy.  Left breast is without masses, edema or erythema.  There is no left axillary adenopathy.  HENT:  Head: Normocephalic and atraumatic.  Mouth/Throat: Oropharynx is clear and moist.  Eyes: Pupils are equal, round, and reactive to light. EOM are normal.  Neck: Normal range of motion.  Cardiovascular: Normal rate, regular rhythm and normal heart sounds.  Pulmonary/Chest: Effort normal and breath sounds normal.  Abdominal: Soft. Bowel sounds are normal.  Musculoskeletal: Normal range of motion. She exhibits no edema, tenderness or deformity.  Lymphadenopathy:    She has no cervical adenopathy.  Neurological: She is alert and oriented to person, place, and time.  Skin: Skin is warm and dry. No rash noted. No erythema.  Psychiatric: She has a normal mood and affect. Her behavior is normal. Judgment and thought content normal.  Vitals reviewed.    Lab Results  Component Value Date   WBC 6.0 09/06/2018   HGB 10.6 (L) 09/06/2018   HCT 35.6 (L) 09/06/2018   MCV 91.8 09/06/2018   PLT 233 09/06/2018   Lab Results  Component Value Date   FERRITIN 190 01/21/2018    IRON 44 01/21/2018   TIBC 245 01/21/2018   UIBC 201 01/21/2018   IRONPCTSAT 18 (L) 01/21/2018   Lab Results  Component Value Date   RETICCTPCT 1.4 06/20/2011   RBC 3.88 09/06/2018   RETICCTABS 55.2 06/20/2011   No results found for: KPAFRELGTCHN, LAMBDASER, KAPLAMBRATIO No results found for: Kandis Cocking, IGMSERUM Lab Results  Component Value Date   TOTALPROTELP 6.6 08/14/2010   ALBUMINELP 53.8 (L) 08/14/2010   A1GS 5.3 (H) 08/14/2010   A2GS 14.3 (H) 08/14/2010   BETS 5.8 08/14/2010   BETA2SER 6.3 08/14/2010  GAMS 14.5 08/14/2010   MSPIKE NOT DET 08/14/2010   SPEI * 08/14/2010     Chemistry      Component Value Date/Time   NA 143 01/21/2018 0955   NA 146 (H) 07/23/2017 0900   NA 144 02/18/2017 0901   K 3.6 01/21/2018 0955   K 3.7 07/23/2017 0900   K 3.7 02/18/2017 0901   CL 106 01/21/2018 0955   CL 107 07/23/2017 0900   CO2 27 01/21/2018 0955   CO2 31 07/23/2017 0900   CO2 27 02/18/2017 0901   BUN 18 01/21/2018 0955   BUN 16 07/23/2017 0900   BUN 17.7 02/18/2017 0901   CREATININE 0.78 01/21/2018 0955   CREATININE 0.8 07/23/2017 0900   CREATININE 0.7 02/18/2017 0901      Component Value Date/Time   CALCIUM 9.5 01/21/2018 0955   CALCIUM 9.3 07/23/2017 0900   CALCIUM 9.3 02/18/2017 0901   ALKPHOS 82 01/21/2018 0955   ALKPHOS 67 07/23/2017 0900   ALKPHOS 78 02/18/2017 0901   AST 17 01/21/2018 0955   AST 13 02/18/2017 0901   ALT 18 01/21/2018 0955   ALT 20 07/23/2017 0900   ALT 12 02/18/2017 0901   BILITOT 0.4 01/21/2018 0955   BILITOT 0.54 02/18/2017 0901      Impression and Plan: Ms. Kercheval is a very pleasant 67 yo caucasian female with history of locally recurrent adenocarcinoma of the right breast with resection and radiation. She is doing well on Aromasin and so far there has been no evidence of recurrence.   Right now, I do not see that we have to make any changes with the Aromasin.  I do not think we have to put her on Faslodex.  I do not think we  have to add a CDK4/CDK6 inhibitor.  Hopefully, the oral iron will help her out.  I would like to see her back in 5 months.  I want to make sure that we follow-up with the anemia.    [name], MD 11/25/201910:09 AM

## 2018-09-07 ENCOUNTER — Telehealth: Payer: Self-pay | Admitting: Hematology & Oncology

## 2018-09-07 LAB — CANCER ANTIGEN 27.29: CA 27.29: 20.9 U/mL (ref 0.0–38.6)

## 2018-09-07 NOTE — Telephone Encounter (Signed)
Called and spoke with patient regarding scheduling appointments for IRON infusion/  Appts made on 12/3 & 12/10 per patient request per 11/26 sch msg

## 2018-09-14 ENCOUNTER — Inpatient Hospital Stay: Payer: Medicare Other | Attending: Family

## 2018-09-14 VITALS — BP 133/49 | HR 71 | Temp 98.0°F | Resp 18

## 2018-09-14 DIAGNOSIS — D509 Iron deficiency anemia, unspecified: Secondary | ICD-10-CM | POA: Insufficient documentation

## 2018-09-14 DIAGNOSIS — D508 Other iron deficiency anemias: Secondary | ICD-10-CM

## 2018-09-14 MED ORDER — SODIUM CHLORIDE 0.9 % IV SOLN
510.0000 mg | Freq: Once | INTRAVENOUS | Status: AC
  Administered 2018-09-14: 510 mg via INTRAVENOUS
  Filled 2018-09-14: qty 17

## 2018-09-14 MED ORDER — SODIUM CHLORIDE 0.9 % IV SOLN
Freq: Once | INTRAVENOUS | Status: AC
  Administered 2018-09-14: 12:00:00 via INTRAVENOUS
  Filled 2018-09-14: qty 250

## 2018-09-14 NOTE — Patient Instructions (Signed)

## 2018-09-20 DIAGNOSIS — I10 Essential (primary) hypertension: Secondary | ICD-10-CM | POA: Diagnosis not present

## 2018-09-20 DIAGNOSIS — E669 Obesity, unspecified: Secondary | ICD-10-CM | POA: Diagnosis not present

## 2018-09-20 DIAGNOSIS — Z6836 Body mass index (BMI) 36.0-36.9, adult: Secondary | ICD-10-CM | POA: Diagnosis not present

## 2018-09-21 ENCOUNTER — Inpatient Hospital Stay: Payer: Medicare Other

## 2018-09-21 VITALS — BP 137/51 | HR 71 | Temp 98.2°F | Resp 18

## 2018-09-21 DIAGNOSIS — D508 Other iron deficiency anemias: Secondary | ICD-10-CM

## 2018-09-21 DIAGNOSIS — D509 Iron deficiency anemia, unspecified: Secondary | ICD-10-CM | POA: Diagnosis not present

## 2018-09-21 MED ORDER — SODIUM CHLORIDE 0.9 % IV SOLN
510.0000 mg | Freq: Once | INTRAVENOUS | Status: AC
  Administered 2018-09-21: 510 mg via INTRAVENOUS
  Filled 2018-09-21: qty 17

## 2018-09-21 NOTE — Patient Instructions (Signed)

## 2018-09-22 DIAGNOSIS — M545 Low back pain: Secondary | ICD-10-CM | POA: Diagnosis not present

## 2018-09-22 DIAGNOSIS — M48062 Spinal stenosis, lumbar region with neurogenic claudication: Secondary | ICD-10-CM | POA: Diagnosis not present

## 2018-10-25 DIAGNOSIS — I10 Essential (primary) hypertension: Secondary | ICD-10-CM | POA: Diagnosis not present

## 2018-10-25 DIAGNOSIS — Z139 Encounter for screening, unspecified: Secondary | ICD-10-CM | POA: Diagnosis not present

## 2018-10-25 DIAGNOSIS — Z6836 Body mass index (BMI) 36.0-36.9, adult: Secondary | ICD-10-CM | POA: Diagnosis not present

## 2019-01-11 DIAGNOSIS — Z6836 Body mass index (BMI) 36.0-36.9, adult: Secondary | ICD-10-CM | POA: Diagnosis not present

## 2019-01-11 DIAGNOSIS — E669 Obesity, unspecified: Secondary | ICD-10-CM | POA: Diagnosis not present

## 2019-01-11 DIAGNOSIS — I10 Essential (primary) hypertension: Secondary | ICD-10-CM | POA: Diagnosis not present

## 2019-01-11 DIAGNOSIS — E782 Mixed hyperlipidemia: Secondary | ICD-10-CM | POA: Diagnosis not present

## 2019-02-07 ENCOUNTER — Inpatient Hospital Stay: Payer: Medicare Other | Attending: Hematology & Oncology | Admitting: Hematology & Oncology

## 2019-02-07 ENCOUNTER — Other Ambulatory Visit: Payer: Self-pay

## 2019-02-07 ENCOUNTER — Encounter: Payer: Self-pay | Admitting: Hematology & Oncology

## 2019-02-07 ENCOUNTER — Inpatient Hospital Stay: Payer: Medicare Other

## 2019-02-07 VITALS — BP 174/75 | HR 97 | Temp 98.5°F | Resp 18 | Wt 205.0 lb

## 2019-02-07 DIAGNOSIS — Z7982 Long term (current) use of aspirin: Secondary | ICD-10-CM | POA: Insufficient documentation

## 2019-02-07 DIAGNOSIS — Z79811 Long term (current) use of aromatase inhibitors: Secondary | ICD-10-CM | POA: Diagnosis not present

## 2019-02-07 DIAGNOSIS — Z170 Estrogen receptor positive status [ER+]: Secondary | ICD-10-CM | POA: Diagnosis not present

## 2019-02-07 DIAGNOSIS — C50011 Malignant neoplasm of nipple and areola, right female breast: Secondary | ICD-10-CM | POA: Diagnosis not present

## 2019-02-07 DIAGNOSIS — C50911 Malignant neoplasm of unspecified site of right female breast: Secondary | ICD-10-CM | POA: Insufficient documentation

## 2019-02-07 DIAGNOSIS — Z79899 Other long term (current) drug therapy: Secondary | ICD-10-CM | POA: Diagnosis not present

## 2019-02-07 DIAGNOSIS — D508 Other iron deficiency anemias: Secondary | ICD-10-CM

## 2019-02-07 LAB — CMP (CANCER CENTER ONLY)
ALT: 18 U/L (ref 0–44)
AST: 17 U/L (ref 15–41)
Albumin: 4.2 g/dL (ref 3.5–5.0)
Alkaline Phosphatase: 68 U/L (ref 38–126)
Anion gap: 10 (ref 5–15)
BUN: 15 mg/dL (ref 8–23)
CO2: 28 mmol/L (ref 22–32)
Calcium: 9.7 mg/dL (ref 8.9–10.3)
Chloride: 105 mmol/L (ref 98–111)
Creatinine: 0.65 mg/dL (ref 0.44–1.00)
GFR, Est AFR Am: >60 mL/min (ref >60)
GFR, Estimated: >60 mL/min (ref >60)
Glucose, Bld: 114 mg/dL — ABNORMAL HIGH (ref 70–99)
Potassium: 3.9 mmol/L (ref 3.5–5.1)
Sodium: 143 mmol/L (ref 135–145)
Total Bilirubin: 0.5 mg/dL (ref 0.3–1.2)
Total Protein: 7.5 g/dL (ref 6.5–8.1)

## 2019-02-07 LAB — CBC WITH DIFFERENTIAL (CANCER CENTER ONLY)
Abs Immature Granulocytes: 0.02 10*3/uL (ref 0.00–0.07)
Basophils Absolute: 0 10*3/uL (ref 0.0–0.1)
Basophils Relative: 0 %
Eosinophils Absolute: 0.2 10*3/uL (ref 0.0–0.5)
Eosinophils Relative: 2 %
HCT: 36.4 % (ref 36.0–46.0)
Hemoglobin: 11.7 g/dL — ABNORMAL LOW (ref 12.0–15.0)
Immature Granulocytes: 0 %
Lymphocytes Relative: 27 %
Lymphs Abs: 2 10*3/uL (ref 0.7–4.0)
MCH: 30.5 pg (ref 26.0–34.0)
MCHC: 32.1 g/dL (ref 30.0–36.0)
MCV: 94.8 fL (ref 80.0–100.0)
Monocytes Absolute: 0.5 10*3/uL (ref 0.1–1.0)
Monocytes Relative: 7 %
Neutro Abs: 4.8 10*3/uL (ref 1.7–7.7)
Neutrophils Relative %: 64 %
Platelet Count: 216 10*3/uL (ref 150–400)
RBC: 3.84 MIL/uL — ABNORMAL LOW (ref 3.87–5.11)
RDW: 12.9 % (ref 11.5–15.5)
WBC Count: 7.5 10*3/uL (ref 4.0–10.5)
nRBC: 0 % (ref 0.0–0.2)

## 2019-02-07 LAB — IRON AND TIBC
Iron: 53 ug/dL (ref 41–142)
Saturation Ratios: 23 % (ref 21–57)
TIBC: 230 ug/dL — ABNORMAL LOW (ref 236–444)
UIBC: 177 ug/dL (ref 120–384)

## 2019-02-07 LAB — FERRITIN: Ferritin: 504 ng/mL — ABNORMAL HIGH (ref 11–307)

## 2019-02-07 MED ORDER — SILVER SULFADIAZINE 1 % EX CREA: 1.0000 "application " | TOPICAL_CREAM | Freq: Every day | CUTANEOUS | 0 refills | Status: DC

## 2019-02-07 NOTE — Progress Notes (Signed)
Hematology and Oncology Follow Up Visit  Veryl Abril [id number] 05/12/1951 68 y.o. 02/07/2019   Principle Diagnosis:  Locally recurrent adenocarcinoma of the right breast History of superficial venous thrombus of the right thigh  Current Therapy:   1. Aromasin 25 mg p.o. daily 2. Aspirin 81 mg p.o. daily   Interim History:  Ms. Ishibashi is here today for follow-up.  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  There seems to be a little more breakdown.  She has been using Neosporin on this.  This has been going on for couple weeks at least.  I looked at this area.  There is certainly a lot of skin breakdown from the radiation.  It is all healed over.  I do not see any obvious infection.  I think the only option for her is that she might need some type of plastic surgery with a skin flap over this area.  I did go ahead and give her some Silvadene cream to apply.  I will know if this will help but is certainly cannot hurt.  She is doing well with oral iron.  We gave her IV iron last year.  This is got her hemoglobin up quite a bit.  She has had no fever.  There is been no cough.  She has had no leg swelling.  She has had no issues with her bladder.  Patient said that the oral iron that she is taking does cause her to have bowel movements.  Overall, her performance status is ECOG 1.    Medications:  Allergies as of 02/07/2019      Reactions   Contrast Media [iodinated Diagnostic Agents] Hives, Shortness Of Breath, Nausea And Vomiting   Allergic reaction even after pre-meds.   Metrizamide Nausea And Vomiting, Shortness Of Breath, Hives   Allergic reaction even after pre-meds.   Other Rash, Shortness Of Breath   Other reaction(s): Nausea And Vomiting Allergic reaction even after pre-meds.   Brilliant Blue Fcf Other (See Comments)   Gives pt hives and causes nausea  CT scan dye   Dye Fdc Blue  [brilliant Blue Fcf (fd&c Blue #1)]    Gives pt hives and causes nausea  CT scan dye      Medication List       Accurate as of February 07, 2019 10:56 AM. Always use your most recent med list.        amLODipine 5 MG tablet Commonly known as:  NORVASC Take 5 mg by mouth daily.   aspirin EC 81 MG tablet Take 81 mg by mouth daily.   candesartan 32 MG tablet Commonly known as:  ATACAND Take 32 mg by mouth daily.   exemestane 25 MG tablet Commonly known as:  AROMASIN TAKE 1 TABLET BY MOUTH EVERY DAY   Fusion Plus Caps Take 1 tablet by mouth daily.   hydrochlorothiazide 25 MG tablet Commonly known as:  HYDRODIURIL Take 25 mg by mouth daily.   ibuprofen 200 MG tablet Commonly known as:  ADVIL Take 200 mg by mouth every 6 (six) hours as needed.   metoprolol succinate 25 MG 24 hr tablet Commonly known as:  TOPROL-XL Take 25 mg by mouth daily.   simvastatin 20 MG tablet Commonly known as:  ZOCOR Take 20 mg by mouth daily.   Vitamin D (Ergocalciferol) 1.25 MG (50000 UT) Caps capsule Commonly known as:  DRISDOL TAKE ONE CAPSULE  BY MOUTH ONE TIME PER WEEK       Allergies:  Allergies  Allergen Reactions  . Contrast Media [Iodinated Diagnostic Agents] Hives, Shortness Of Breath and Nausea And Vomiting    Allergic reaction even after pre-meds.  . Metrizamide Nausea And Vomiting, Shortness Of Breath and Hives    Allergic reaction even after pre-meds.  . Other Rash and Shortness Of Breath    Other reaction(s): Nausea And Vomiting Allergic reaction even after pre-meds.  Dillard [place] Blue Fcf Other (See Comments)    Gives pt hives and causes nausea  CT scan dye  . Dye Fdc Blue [Brilliant Blue Fcf (Fd&C Blue #1)]     Gives pt hives and causes nausea  CT scan dye    Past Medical History, Surgical history, Social history, and Family History were reviewed and updated.  Review of Systems: Review of Systems  Constitutional: Negative.   HENT: Negative.   Eyes: Negative.    Respiratory: Negative.   Cardiovascular: Negative.   Gastrointestinal: Negative.   Genitourinary: Negative.   Musculoskeletal: Positive for back pain.  Skin: Positive for rash.  Neurological: Negative.   Endo/Heme/Allergies: Negative.   Psychiatric/Behavioral: Negative.      Physical Exam:  weight is 205 lb (93 kg). Her oral temperature is 98.5 F (36.9 C). Her blood pressure is 174/75 (abnormal) and her pulse is 97. Her respiration is 18 and oxygen saturation is 100%.   Wt Readings from Last 3 Encounters:  02/07/19 205 lb (93 kg)  09/06/18 201 lb (91.2 kg)  04/13/18 200 lb (90.7 kg)    Physical Exam Vitals signs reviewed.  Constitutional:      Comments: Right chest wall shows the mastectomy site.  She has radiation telangiectasias on the right anterior chest wall.  She has some areas of poor healing.  No bleeding is noted.  There is no exudate.  There is no fluid.  There is no right axillary adenopathy.  Left breast is without masses, edema or erythema.  There is no left axillary adenopathy.  HENT:     Head: Normocephalic and atraumatic.  Eyes:     Pupils: Pupils are equal, round, and reactive to light.  Neck:     Musculoskeletal: Normal range of motion.  Cardiovascular:     Rate and Rhythm: Normal rate and regular rhythm.     Heart sounds: Normal heart sounds.  Pulmonary:     Effort: Pulmonary effort is normal.     Breath sounds: Normal breath sounds.  Abdominal:     General: Bowel sounds are normal.     Palpations: Abdomen is soft.  Musculoskeletal: Normal range of motion.        General: No tenderness or deformity.  Lymphadenopathy:     Cervical: No cervical adenopathy.  Skin:    General: Skin is warm and dry.     Findings: No erythema or rash.  Neurological:     Mental Status: She is alert and oriented to person, place, and time.  Psychiatric:        Behavior: Behavior normal.        Thought Content: Thought content normal.        Judgment: Judgment normal.       Lab Results  Component Value Date   WBC 7.5 02/07/2019   HGB 11.7 (L) 02/07/2019   HCT 36.4 02/07/2019   MCV 94.8 02/07/2019   PLT 216 02/07/2019   Lab Results  Component Value Date   FERRITIN 294 09/06/2018  IRON 46 09/06/2018   TIBC 233 (L) 09/06/2018   UIBC 186 09/06/2018   IRONPCTSAT 20 (L) 09/06/2018   Lab Results  Component Value Date   RETICCTPCT 1.4 06/20/2011   RBC 3.84 (L) 02/07/2019   RETICCTABS 55.2 06/20/2011   No results found for: KPAFRELGTCHN, LAMBDASER, KAPLAMBRATIO No results found for: Kandis Cocking [facility] Lab Results  Component Value Date   TOTALPROTELP 6.6 08/14/2010   ALBUMINELP 53.8 (L) 08/14/2010   A1GS 5.3 (H) 08/14/2010   A2GS 14.3 (H) 08/14/2010   BETS 5.8 08/14/2010   BETA2SER 6.3 08/14/2010   GAMS 14.5 08/14/2010   MSPIKE NOT DET 08/14/2010   SPEI * 08/14/2010     Chemistry      Component Value Date/Time   NA 143 02/07/2019 0952   NA 146 (H) 07/23/2017 0900   NA 144 02/18/2017 0901   K 3.9 02/07/2019 0952   K 3.7 07/23/2017 0900   K 3.7 02/18/2017 0901   CL 105 02/07/2019 0952   CL 107 07/23/2017 0900   CO2 28 02/07/2019 0952   CO2 31 07/23/2017 0900   CO2 27 02/18/2017 0901   BUN 15 02/07/2019 0952   BUN 16 07/23/2017 0900   BUN 17.7 02/18/2017 0901   CREATININE 0.65 02/07/2019 0952   CREATININE 0.8 07/23/2017 0900   CREATININE 0.7 02/18/2017 0901      Component Value Date/Time   CALCIUM 9.7 02/07/2019 0952   CALCIUM 9.3 07/23/2017 0900   CALCIUM 9.3 02/18/2017 0901   ALKPHOS 68 02/07/2019 0952   ALKPHOS 67 07/23/2017 0900   ALKPHOS 78 02/18/2017 0901   AST 17 02/07/2019 0952   AST 13 02/18/2017 0901   ALT 18 02/07/2019 0952   ALT 20 07/23/2017 0900   ALT 12 02/18/2017 0901   BILITOT 0.5 02/07/2019 0952   BILITOT 0.54 02/18/2017 0901      Impression and Plan: Ms. Noland is a very pleasant 69 yo caucasian female with history of locally recurrent adenocarcinoma of the right breast with resection and  radiation. She is doing well on Aromasin and so far there has been no evidence of recurrence.   We will see how the Silvadene cream can help with this skin breakdown on the right chest wall.  I want to see her back in 2 months.  If there is still issues in 2 months, then I think she will have to go to plastic surgery.      [name], MD 4/27/202010:56 AM

## 2019-02-08 LAB — CANCER ANTIGEN 27.29: CA 27.29: 31.2 U/mL (ref 0.0–38.6)

## 2019-02-10 DIAGNOSIS — E782 Mixed hyperlipidemia: Secondary | ICD-10-CM | POA: Diagnosis not present

## 2019-02-10 DIAGNOSIS — E669 Obesity, unspecified: Secondary | ICD-10-CM | POA: Diagnosis not present

## 2019-02-10 DIAGNOSIS — Z6836 Body mass index (BMI) 36.0-36.9, adult: Secondary | ICD-10-CM | POA: Diagnosis not present

## 2019-02-10 DIAGNOSIS — I10 Essential (primary) hypertension: Secondary | ICD-10-CM | POA: Diagnosis not present

## 2019-02-14 DIAGNOSIS — I10 Essential (primary) hypertension: Secondary | ICD-10-CM | POA: Diagnosis not present

## 2019-02-14 DIAGNOSIS — E782 Mixed hyperlipidemia: Secondary | ICD-10-CM | POA: Diagnosis not present

## 2019-02-21 DIAGNOSIS — Z1331 Encounter for screening for depression: Secondary | ICD-10-CM | POA: Diagnosis not present

## 2019-02-21 DIAGNOSIS — Z139 Encounter for screening, unspecified: Secondary | ICD-10-CM | POA: Diagnosis not present

## 2019-02-21 DIAGNOSIS — Z0000 Encounter for general adult medical examination without abnormal findings: Secondary | ICD-10-CM | POA: Diagnosis not present

## 2019-02-21 DIAGNOSIS — E782 Mixed hyperlipidemia: Secondary | ICD-10-CM | POA: Diagnosis not present

## 2019-02-21 DIAGNOSIS — I10 Essential (primary) hypertension: Secondary | ICD-10-CM | POA: Diagnosis not present

## 2019-03-11 DIAGNOSIS — Z6836 Body mass index (BMI) 36.0-36.9, adult: Secondary | ICD-10-CM | POA: Diagnosis not present

## 2019-03-11 DIAGNOSIS — I10 Essential (primary) hypertension: Secondary | ICD-10-CM | POA: Diagnosis not present

## 2019-03-11 DIAGNOSIS — E669 Obesity, unspecified: Secondary | ICD-10-CM | POA: Diagnosis not present

## 2019-03-11 DIAGNOSIS — E782 Mixed hyperlipidemia: Secondary | ICD-10-CM | POA: Diagnosis not present

## 2019-03-17 ENCOUNTER — Other Ambulatory Visit: Payer: Self-pay | Admitting: Hematology & Oncology

## 2019-03-24 ENCOUNTER — Other Ambulatory Visit: Payer: Self-pay | Admitting: Hematology & Oncology

## 2019-03-28 ENCOUNTER — Other Ambulatory Visit: Payer: Self-pay | Admitting: Hematology & Oncology

## 2019-03-28 DIAGNOSIS — Z1231 Encounter for screening mammogram for malignant neoplasm of breast: Secondary | ICD-10-CM

## 2019-04-11 ENCOUNTER — Other Ambulatory Visit: Payer: Self-pay

## 2019-04-11 ENCOUNTER — Inpatient Hospital Stay: Payer: Medicare Other

## 2019-04-11 ENCOUNTER — Encounter: Payer: Self-pay | Admitting: Hematology & Oncology

## 2019-04-11 ENCOUNTER — Inpatient Hospital Stay: Payer: Medicare Other | Attending: Hematology & Oncology | Admitting: Hematology & Oncology

## 2019-04-11 VITALS — BP 157/63 | HR 73 | Temp 98.4°F | Resp 17 | Wt 206.0 lb

## 2019-04-11 DIAGNOSIS — D508 Other iron deficiency anemias: Secondary | ICD-10-CM

## 2019-04-11 DIAGNOSIS — C50011 Malignant neoplasm of nipple and areola, right female breast: Secondary | ICD-10-CM | POA: Diagnosis not present

## 2019-04-11 DIAGNOSIS — I890 Lymphedema, not elsewhere classified: Secondary | ICD-10-CM | POA: Diagnosis not present

## 2019-04-11 DIAGNOSIS — M549 Dorsalgia, unspecified: Secondary | ICD-10-CM | POA: Insufficient documentation

## 2019-04-11 DIAGNOSIS — Z79811 Long term (current) use of aromatase inhibitors: Secondary | ICD-10-CM

## 2019-04-11 DIAGNOSIS — Z7982 Long term (current) use of aspirin: Secondary | ICD-10-CM | POA: Insufficient documentation

## 2019-04-11 DIAGNOSIS — L988 Other specified disorders of the skin and subcutaneous tissue: Secondary | ICD-10-CM | POA: Diagnosis not present

## 2019-04-11 DIAGNOSIS — D500 Iron deficiency anemia secondary to blood loss (chronic): Secondary | ICD-10-CM

## 2019-04-11 DIAGNOSIS — Z9011 Acquired absence of right breast and nipple: Secondary | ICD-10-CM | POA: Insufficient documentation

## 2019-04-11 DIAGNOSIS — Z79899 Other long term (current) drug therapy: Secondary | ICD-10-CM | POA: Diagnosis not present

## 2019-04-11 DIAGNOSIS — R21 Rash and other nonspecific skin eruption: Secondary | ICD-10-CM | POA: Insufficient documentation

## 2019-04-11 DIAGNOSIS — C50911 Malignant neoplasm of unspecified site of right female breast: Secondary | ICD-10-CM | POA: Insufficient documentation

## 2019-04-11 DIAGNOSIS — Z170 Estrogen receptor positive status [ER+]: Secondary | ICD-10-CM

## 2019-04-11 LAB — CBC WITH DIFFERENTIAL (CANCER CENTER ONLY)
Abs Immature Granulocytes: 0.02 10*3/uL (ref 0.00–0.07)
Basophils Absolute: 0 10*3/uL (ref 0.0–0.1)
Basophils Relative: 1 %
Eosinophils Absolute: 0.3 10*3/uL (ref 0.0–0.5)
Eosinophils Relative: 4 %
HCT: 35.8 % — ABNORMAL LOW (ref 36.0–46.0)
Hemoglobin: 11.5 g/dL — ABNORMAL LOW (ref 12.0–15.0)
Immature Granulocytes: 0 %
Lymphocytes Relative: 32 %
Lymphs Abs: 1.9 10*3/uL (ref 0.7–4.0)
MCH: 30.1 pg (ref 26.0–34.0)
MCHC: 32.1 g/dL (ref 30.0–36.0)
MCV: 93.7 fL (ref 80.0–100.0)
Monocytes Absolute: 0.5 10*3/uL (ref 0.1–1.0)
Monocytes Relative: 8 %
Neutro Abs: 3.3 10*3/uL (ref 1.7–7.7)
Neutrophils Relative %: 55 %
Platelet Count: 202 10*3/uL (ref 150–400)
RBC: 3.82 MIL/uL — ABNORMAL LOW (ref 3.87–5.11)
RDW: 12.8 % (ref 11.5–15.5)
WBC Count: 6.1 10*3/uL (ref 4.0–10.5)
nRBC: 0 % (ref 0.0–0.2)

## 2019-04-11 LAB — CMP (CANCER CENTER ONLY)
ALT: 18 U/L (ref 0–44)
AST: 18 U/L (ref 15–41)
Albumin: 4.1 g/dL (ref 3.5–5.0)
Alkaline Phosphatase: 55 U/L (ref 38–126)
Anion gap: 9 (ref 5–15)
BUN: 13 mg/dL (ref 8–23)
CO2: 29 mmol/L (ref 22–32)
Calcium: 9.5 mg/dL (ref 8.9–10.3)
Chloride: 105 mmol/L (ref 98–111)
Creatinine: 0.73 mg/dL (ref 0.44–1.00)
GFR, Est AFR Am: >60 mL/min (ref >60)
GFR, Estimated: >60 mL/min (ref >60)
Glucose, Bld: 105 mg/dL — ABNORMAL HIGH (ref 70–99)
Potassium: 3.8 mmol/L (ref 3.5–5.1)
Sodium: 143 mmol/L (ref 135–145)
Total Bilirubin: 0.5 mg/dL (ref 0.3–1.2)
Total Protein: 7.2 g/dL (ref 6.5–8.1)

## 2019-04-11 NOTE — Progress Notes (Signed)
Hematology and Oncology Follow Up Visit  [name] [id number] [birth year] 68 y.o. 04/11/2019   Principle Diagnosis:  Locally recurrent adenocarcinoma of the right breast History of superficial venous thrombus of the right thigh  Current Therapy:   1. Aromasin 25 mg p.o. daily 2. Aspirin 81 mg p.o. daily   Interim History:  [name] is here today for follow-up.  She is still having issues with the skin breakdown over on the right chest wall.  She has been using the Silvadene cream.  There are some eschars on the area.  She says that these, she tends to bleed.  I suspect that this probably is going to have to be an issue that plastic surgery may have to take care of.  Her regular surgeon retired.  I will have to send her to 1 of his colleagues and see if he can help get the process going.  Otherwise, she is doing okay.  She is had no problems with nausea or vomiting.  She has had no change in bowel or bladder habits.  There is been no issues with hot flashes.  She has had no sweats.  She does have some lymphedema in the right arm which is chronic.  She has had no obvious bleeding.  We have given her iron in the past.  Overall, her performance status is ECOG 1.    Medications:  Allergies as of 04/11/2019      Reactions   Contrast Media [iodinated Diagnostic Agents] Hives, Shortness Of Breath, Nausea And Vomiting   Allergic reaction even after pre-meds.   Metrizamide Nausea And Vomiting, Shortness Of Breath, Hives   Allergic reaction even after pre-meds.   Other Rash, Shortness Of Breath   Other reaction(s): Nausea And Vomiting Allergic reaction even after pre-meds.   Brilliant Blue Fcf Other (See Comments)   Gives pt hives and causes nausea  CT scan dye   Dye Fdc Blue [brilliant Blue Fcf (fd&c Blue #1)]    Gives pt hives and causes nausea  CT scan dye      Medication List       Accurate as of April 11, 2019 10:18 AM. If you have any questions, ask your nurse or doctor.        amLODipine 5 MG tablet Commonly known as: NORVASC Take 5 mg by mouth daily.   aspirin EC 81 MG tablet Take 81 mg by mouth daily.   candesartan 32 MG tablet Commonly known as: ATACAND Take 32 mg by mouth daily.   exemestane 25 MG tablet Commonly known as: AROMASIN TAKE 1 TABLET BY MOUTH EVERY DAY   FUSION PLUS PO Take 130 mg by mouth once a week. What changed: Another medication with the same name was removed. Continue taking this medication, and follow the directions you see here. Changed by: [name], MD   hydrochlorothiazide 25 MG tablet Commonly known as: HYDRODIURIL Take 25 mg by mouth daily.   ibuprofen 200 MG tablet Commonly known as: ADVIL Take 200 mg by mouth every 6 (six) hours as needed.   metoprolol succinate 50 MG 24 hr tablet Commonly known as: TOPROL-XL Take 50 mg by mouth daily. What changed: Another medication with the same name was removed. Continue taking this medication, and follow the directions you see here. Changed by: [name], MD   silver sulfADIAZINE 1 % cream Commonly known as: Silvadene Apply 1 application topically daily.   simvastatin 20 MG tablet Commonly known as: ZOCOR Take 20 mg  by mouth daily.   Vitamin D (Ergocalciferol) 1.25 MG (50000 UT) Caps capsule Commonly known as: DRISDOL TAKE ONE CAPSULE BY MOUTH ONE TIME PER WEEK       Allergies:  Allergies  Allergen Reactions  . Contrast Media [Iodinated Diagnostic Agents] Hives, Shortness Of Breath and Nausea And Vomiting    Allergic reaction even after pre-meds.  . Metrizamide Nausea And Vomiting, Shortness Of Breath and Hives    Allergic reaction even after pre-meds.  . Other Rash and Shortness Of Breath    Other reaction(s): Nausea And Vomiting Allergic reaction even after pre-meds.  Dillard [place] Blue Fcf Other (See Comments)    Gives pt hives and causes nausea  CT scan dye  . Dye Fdc Blue [Brilliant Blue Fcf (Fd&C Blue #1)]     Gives pt hives and  causes nausea  CT scan dye    Past Medical History, Surgical history, Social history, and Family History were reviewed and updated.  Review of Systems: Review of Systems  Constitutional: Negative.   HENT: Negative.   Eyes: Negative.   Respiratory: Negative.   Cardiovascular: Negative.   Gastrointestinal: Negative.   Genitourinary: Negative.   Musculoskeletal: Positive for back pain.  Skin: Positive for rash.  Neurological: Negative.   Endo/Heme/Allergies: Negative.   Psychiatric/Behavioral: Negative.      Physical Exam:  weight is 206 lb (93.4 kg). Her oral temperature is 98.4 F (36.9 C). Her blood pressure is 157/63 (abnormal) and her pulse is 73. Her respiration is 17 and oxygen saturation is 99%.   Wt Readings from Last 3 Encounters:  04/11/19 206 lb (93.4 kg)  02/07/19 205 lb (93 kg)  09/06/18 201 lb (91.2 kg)    Physical Exam Vitals signs reviewed.  Constitutional:      Comments: Right chest wall shows the mastectomy site.  She has radiation telangiectasias on the right anterior chest wall.  She has some areas of poor healing.  No bleeding is noted.  There is no exudate.  There is no fluid.  There is no right axillary adenopathy.  Left breast is without masses, edema or erythema.  There is no left axillary adenopathy.  HENT:     Head: Normocephalic and atraumatic.  Eyes:     Pupils: Pupils are equal, round, and reactive to light.  Neck:     Musculoskeletal: Normal range of motion.  Cardiovascular:     Rate and Rhythm: Normal rate and regular rhythm.     Heart sounds: Normal heart sounds.  Pulmonary:     Effort: Pulmonary effort is normal.     Breath sounds: Normal breath sounds.  Abdominal:     General: Bowel sounds are normal.     Palpations: Abdomen is soft.  Musculoskeletal: Normal range of motion.        General: No tenderness or deformity.  Lymphadenopathy:     Cervical: No cervical adenopathy.  Skin:    General: Skin is warm and dry.     Findings:  No erythema or rash.  Neurological:     Mental Status: She is alert and oriented to person, place, and time.  Psychiatric:        Behavior: Behavior normal.        Thought Content: Thought content normal.        Judgment: Judgment normal.      Lab Results  Component Value Date   WBC 6.1 04/11/2019   HGB 11.5 (L) 04/11/2019   HCT 35.8 (L) 04/11/2019  MCV 93.7 04/11/2019   PLT 202 04/11/2019   Lab Results  Component Value Date   FERRITIN 504 (H) 02/07/2019   IRON 53 02/07/2019   TIBC 230 (L) 02/07/2019   UIBC 177 02/07/2019   IRONPCTSAT 23 02/07/2019   Lab Results  Component Value Date   RETICCTPCT 1.4 06/20/2011   RBC 3.82 (L) 04/11/2019   RETICCTABS 55.2 06/20/2011   No results found for: KPAFRELGTCHN, LAMBDASER, KAPLAMBRATIO No results found for: Kandis Cocking [facility] Lab Results  Component Value Date   TOTALPROTELP 6.6 08/14/2010   ALBUMINELP 53.8 (L) 08/14/2010   A1GS 5.3 (H) 08/14/2010   A2GS 14.3 (H) 08/14/2010   BETS 5.8 08/14/2010   BETA2SER 6.3 08/14/2010   GAMS 14.5 08/14/2010   MSPIKE NOT DET 08/14/2010   SPEI * 08/14/2010     Chemistry      Component Value Date/Time   NA 143 04/11/2019 0916   NA 146 (H) 07/23/2017 0900   NA 144 02/18/2017 0901   K 3.8 04/11/2019 0916   K 3.7 07/23/2017 0900   K 3.7 02/18/2017 0901   CL 105 04/11/2019 0916   CL 107 07/23/2017 0900   CO2 29 04/11/2019 0916   CO2 31 07/23/2017 0900   CO2 27 02/18/2017 0901   BUN 13 04/11/2019 0916   BUN 16 07/23/2017 0900   BUN 17.7 02/18/2017 0901   CREATININE 0.73 04/11/2019 0916   CREATININE 0.8 07/23/2017 0900   CREATININE 0.7 02/18/2017 0901      Component Value Date/Time   CALCIUM 9.5 04/11/2019 0916   CALCIUM 9.3 07/23/2017 0900   CALCIUM 9.3 02/18/2017 0901   ALKPHOS 55 04/11/2019 0916   ALKPHOS 67 07/23/2017 0900   ALKPHOS 78 02/18/2017 0901   AST 18 04/11/2019 0916   AST 13 02/18/2017 0901   ALT 18 04/11/2019 0916   ALT 20 07/23/2017 0900   ALT 12  02/18/2017 0901   BILITOT 0.5 04/11/2019 0916   BILITOT 0.54 02/18/2017 0901      Impression and Plan: [name] is a very pleasant 68 yo caucasian female with history of locally recurrent adenocarcinoma of the right breast with resection and radiation. She is doing well on Aromasin and so far there has been no evidence of recurrence.   At this time, I will see about making a referral back to general surgery.  And they can see about any type of plastic or reconstructive surgery that might be needed for the right chest wall.  Again I see nothing that relates to breast cancer recurrence.  Her last tumor marker was 31.2.  I would like to see her back in 3 months now.  Hopefully by then, this issue with the right anterior chest wall will be taken care of.  [name], MD 6/29/202010:18 AM

## 2019-04-12 DIAGNOSIS — Z6836 Body mass index (BMI) 36.0-36.9, adult: Secondary | ICD-10-CM | POA: Diagnosis not present

## 2019-04-12 DIAGNOSIS — E669 Obesity, unspecified: Secondary | ICD-10-CM | POA: Diagnosis not present

## 2019-04-12 DIAGNOSIS — I10 Essential (primary) hypertension: Secondary | ICD-10-CM | POA: Diagnosis not present

## 2019-04-12 DIAGNOSIS — E782 Mixed hyperlipidemia: Secondary | ICD-10-CM | POA: Diagnosis not present

## 2019-04-12 LAB — CANCER ANTIGEN 27.29: CA 27.29: 24.4 U/mL (ref 0.0–38.6)

## 2019-04-21 DIAGNOSIS — Z923 Personal history of irradiation: Secondary | ICD-10-CM | POA: Diagnosis not present

## 2019-04-21 DIAGNOSIS — Z853 Personal history of malignant neoplasm of breast: Secondary | ICD-10-CM | POA: Diagnosis not present

## 2019-04-21 DIAGNOSIS — Z9011 Acquired absence of right breast and nipple: Secondary | ICD-10-CM | POA: Diagnosis not present

## 2019-05-11 ENCOUNTER — Other Ambulatory Visit: Payer: Self-pay

## 2019-05-11 ENCOUNTER — Ambulatory Visit
Admission: RE | Admit: 2019-05-11 | Discharge: 2019-05-11 | Disposition: A | Discharge status: Home / Self Care | Payer: Medicare Other | Source: Ambulatory Visit | Attending: Hematology & Oncology | Admitting: Hematology & Oncology

## 2019-05-11 DIAGNOSIS — Z1231 Encounter for screening mammogram for malignant neoplasm of breast: Secondary | ICD-10-CM | POA: Diagnosis not present

## 2019-05-13 DIAGNOSIS — E782 Mixed hyperlipidemia: Secondary | ICD-10-CM | POA: Diagnosis not present

## 2019-05-13 DIAGNOSIS — I10 Essential (primary) hypertension: Secondary | ICD-10-CM | POA: Diagnosis not present

## 2019-05-13 DIAGNOSIS — Z6836 Body mass index (BMI) 36.0-36.9, adult: Secondary | ICD-10-CM | POA: Diagnosis not present

## 2019-05-13 DIAGNOSIS — E669 Obesity, unspecified: Secondary | ICD-10-CM | POA: Diagnosis not present

## 2019-06-13 DIAGNOSIS — Z6836 Body mass index (BMI) 36.0-36.9, adult: Secondary | ICD-10-CM | POA: Diagnosis not present

## 2019-06-13 DIAGNOSIS — I10 Essential (primary) hypertension: Secondary | ICD-10-CM | POA: Diagnosis not present

## 2019-06-13 DIAGNOSIS — E782 Mixed hyperlipidemia: Secondary | ICD-10-CM | POA: Diagnosis not present

## 2019-06-13 DIAGNOSIS — E669 Obesity, unspecified: Secondary | ICD-10-CM | POA: Diagnosis not present

## 2019-07-12 ENCOUNTER — Inpatient Hospital Stay: Payer: Medicare Other | Attending: Hematology & Oncology | Admitting: Hematology & Oncology

## 2019-07-12 ENCOUNTER — Telehealth: Payer: Self-pay | Admitting: *Deleted

## 2019-07-12 ENCOUNTER — Encounter: Payer: Self-pay | Admitting: Hematology & Oncology

## 2019-07-12 ENCOUNTER — Inpatient Hospital Stay: Payer: Medicare Other

## 2019-07-12 ENCOUNTER — Other Ambulatory Visit: Payer: Self-pay

## 2019-07-12 VITALS — BP 162/65 | HR 75 | Temp 97.5°F | Resp 18 | Ht 64.0 in | Wt 208.1 lb

## 2019-07-12 DIAGNOSIS — Z79899 Other long term (current) drug therapy: Secondary | ICD-10-CM | POA: Diagnosis not present

## 2019-07-12 DIAGNOSIS — Z23 Encounter for immunization: Secondary | ICD-10-CM | POA: Diagnosis not present

## 2019-07-12 DIAGNOSIS — M62838 Other muscle spasm: Secondary | ICD-10-CM | POA: Diagnosis not present

## 2019-07-12 DIAGNOSIS — I10 Essential (primary) hypertension: Secondary | ICD-10-CM | POA: Diagnosis not present

## 2019-07-12 DIAGNOSIS — Z79811 Long term (current) use of aromatase inhibitors: Secondary | ICD-10-CM | POA: Diagnosis not present

## 2019-07-12 DIAGNOSIS — C50011 Malignant neoplasm of nipple and areola, right female breast: Secondary | ICD-10-CM

## 2019-07-12 DIAGNOSIS — Z7982 Long term (current) use of aspirin: Secondary | ICD-10-CM | POA: Insufficient documentation

## 2019-07-12 DIAGNOSIS — R21 Rash and other nonspecific skin eruption: Secondary | ICD-10-CM | POA: Insufficient documentation

## 2019-07-12 DIAGNOSIS — I890 Lymphedema, not elsewhere classified: Secondary | ICD-10-CM | POA: Diagnosis not present

## 2019-07-12 DIAGNOSIS — D500 Iron deficiency anemia secondary to blood loss (chronic): Secondary | ICD-10-CM | POA: Diagnosis not present

## 2019-07-12 DIAGNOSIS — Z923 Personal history of irradiation: Secondary | ICD-10-CM | POA: Diagnosis not present

## 2019-07-12 DIAGNOSIS — E785 Hyperlipidemia, unspecified: Secondary | ICD-10-CM | POA: Insufficient documentation

## 2019-07-12 DIAGNOSIS — Z170 Estrogen receptor positive status [ER+]: Secondary | ICD-10-CM | POA: Diagnosis not present

## 2019-07-12 DIAGNOSIS — M549 Dorsalgia, unspecified: Secondary | ICD-10-CM | POA: Insufficient documentation

## 2019-07-12 LAB — CMP (CANCER CENTER ONLY)
ALT: 21 U/L (ref 0–44)
AST: 19 U/L (ref 15–41)
Albumin: 4.2 g/dL (ref 3.5–5.0)
Alkaline Phosphatase: 63 U/L (ref 38–126)
Anion gap: 6 (ref 5–15)
BUN: 22 mg/dL (ref 8–23)
CO2: 32 mmol/L (ref 22–32)
Calcium: 10 mg/dL (ref 8.9–10.3)
Chloride: 105 mmol/L (ref 98–111)
Creatinine: 0.77 mg/dL (ref 0.44–1.00)
GFR, Est AFR Am: >60 mL/min (ref >60)
GFR, Estimated: >60 mL/min (ref >60)
Glucose, Bld: 110 mg/dL — ABNORMAL HIGH (ref 70–99)
Potassium: 4.1 mmol/L (ref 3.5–5.1)
Sodium: 143 mmol/L (ref 135–145)
Total Bilirubin: 0.4 mg/dL (ref 0.3–1.2)
Total Protein: 7.5 g/dL (ref 6.5–8.1)

## 2019-07-12 LAB — CBC WITH DIFFERENTIAL (CANCER CENTER ONLY)
Abs Immature Granulocytes: 0.03 10*3/uL (ref 0.00–0.07)
Basophils Absolute: 0 10*3/uL (ref 0.0–0.1)
Basophils Relative: 1 %
Eosinophils Absolute: 0.3 10*3/uL (ref 0.0–0.5)
Eosinophils Relative: 5 %
HCT: 35.9 % — ABNORMAL LOW (ref 36.0–46.0)
Hemoglobin: 11.3 g/dL — ABNORMAL LOW (ref 12.0–15.0)
Immature Granulocytes: 1 %
Lymphocytes Relative: 31 %
Lymphs Abs: 2.1 10*3/uL (ref 0.7–4.0)
MCH: 29.8 pg (ref 26.0–34.0)
MCHC: 31.5 g/dL (ref 30.0–36.0)
MCV: 94.7 fL (ref 80.0–100.0)
Monocytes Absolute: 0.6 10*3/uL (ref 0.1–1.0)
Monocytes Relative: 9 %
Neutro Abs: 3.6 10*3/uL (ref 1.7–7.7)
Neutrophils Relative %: 53 %
Platelet Count: 207 10*3/uL (ref 150–400)
RBC: 3.79 MIL/uL — ABNORMAL LOW (ref 3.87–5.11)
RDW: 13.5 % (ref 11.5–15.5)
WBC Count: 6.6 10*3/uL (ref 4.0–10.5)
nRBC: 0 % (ref 0.0–0.2)

## 2019-07-12 LAB — IRON AND TIBC
Iron: 65 ug/dL (ref 41–142)
Saturation Ratios: 30 % (ref 21–57)
TIBC: 218 ug/dL — ABNORMAL LOW (ref 236–444)
UIBC: 153 ug/dL (ref 120–384)

## 2019-07-12 LAB — FERRITIN: Ferritin: 572 ng/mL — ABNORMAL HIGH (ref 11–307)

## 2019-07-12 MED ORDER — INFLUENZA VAC SPLIT QUAD 0.5 ML IM SUSY
0.5000 mL | PREFILLED_SYRINGE | Freq: Once | INTRAMUSCULAR | Status: AC
  Administered 2019-07-12: 11:00:00 0.5 mL via INTRAMUSCULAR

## 2019-07-12 MED ORDER — INFLUENZA VAC SPLIT QUAD 0.5 ML IM SUSY
PREFILLED_SYRINGE | INTRAMUSCULAR | Status: AC
  Filled 2019-07-12: qty 0.5

## 2019-07-12 NOTE — Patient Instructions (Signed)

## 2019-07-12 NOTE — Telephone Encounter (Signed)
Patient notified per order of Dr. Ennever that "the iron level is ok!!"  Patient appreciative of call and has no questions or concerns at this time.   

## 2019-07-12 NOTE — Progress Notes (Signed)
Hematology and Oncology Follow Up Visit  [name] [id number] Apr 13, 1951 68 y.o. 07/12/2019   Principle Diagnosis:  Locally recurrent adenocarcinoma of the right breast History of superficial venous thrombus of the right thigh  Current Therapy:   1. Aromasin 25 mg p.o. daily 2. Aspirin 81 mg p.o. daily   Interim History:  [name] is here today for follow-up.  She is doing pretty well.  She is trying to avoid the coronavirus.  She did see surgery about the area of radiation dermatitis on the right anterior chest wall.  They do not feel that there is anything that can be done regarding this.  She has had no problems with cough.  There is been no shortness of breath.  She has had no fever.  She has had no rashes.  There is been no headache.  She did "tweak" her neck.  She has some muscle spasms in the left side of her neck.  This is from sleeping the wrong way.  Her family has been doing pretty well.  She does have some lymphedema in the right arm which is chronic.  She is controlling this fairly well.  She says that there is occasion where she can have some swelling that is a bit worse in the right arm.  She has had no obvious bleeding.  We have given her iron in the past.  We will have to keep following this.  Overall, her performance status is ECOG 1.    Medications:  Allergies as of 07/12/2019      Reactions   Contrast Media [iodinated Diagnostic Agents] Hives, Shortness Of Breath, Nausea And Vomiting   Allergic reaction even after pre-meds.   Metrizamide Hives, Shortness Of Breath, Nausea And Vomiting   Allergic reaction even after pre-meds.   Other Shortness Of Breath, Nausea And Vomiting, Rash   Allergic reaction even after pre-meds.   Brilliant Blue Fcf Hives, Nausea Only   Dye Fdc Blue [brilliant Blue Fcf (fd&c Blue #1)] Hives, Nausea Only      Medication List       Accurate as of July 12, 2019 10:12 AM. If you have any questions, ask your nurse or doctor.         amLODipine 5 MG tablet Commonly known as: NORVASC Take 5 mg by mouth daily.   aspirin EC 81 MG tablet Take 81 mg by mouth daily.   candesartan 32 MG tablet Commonly known as: ATACAND Take 32 mg by mouth daily.   exemestane 25 MG tablet Commonly known as: AROMASIN TAKE 1 TABLET BY MOUTH EVERY DAY   FUSION PLUS PO Take 130 mg by mouth once a week.   hydrochlorothiazide 25 MG tablet Commonly known as: HYDRODIURIL Take 25 mg by mouth daily.   ibuprofen 200 MG tablet Commonly known as: ADVIL Take 200 mg by mouth every 6 (six) hours as needed.   metoprolol succinate 50 MG 24 hr tablet Commonly known as: TOPROL-XL Take 50 mg by mouth daily.   silver sulfADIAZINE 1 % cream Commonly known as: Silvadene Apply 1 application topically daily.   simvastatin 20 MG tablet Commonly known as: ZOCOR Take 20 mg by mouth daily.   Vitamin D (Ergocalciferol) 1.25 MG (50000 UT) Caps capsule Commonly known as: DRISDOL TAKE ONE CAPSULE BY MOUTH ONE TIME PER WEEK       Allergies:  Allergies  Allergen Reactions  . Contrast Media [Iodinated Diagnostic Agents] Hives, Shortness Of Breath and Nausea And Vomiting    Allergic  reaction even after pre-meds.  . Metrizamide Hives, Shortness Of Breath and Nausea And Vomiting    Allergic reaction even after pre-meds.  . Other Shortness Of Breath, Nausea And Vomiting and Rash    Allergic reaction even after pre-meds.  Dillard [place] Blue Fcf Hives and Nausea Only  . Dye Fdc Blue [Brilliant Blue Fcf (Fd&C Blue #1)] Hives and Nausea Only    Past Medical History, Surgical history, Social history, and Family History were reviewed and updated.  Review of Systems: Review of Systems  Constitutional: Negative.   HENT: Negative.   Eyes: Negative.   Respiratory: Negative.   Cardiovascular: Negative.   Gastrointestinal: Negative.   Genitourinary: Negative.   Musculoskeletal: Positive for back pain.  Skin: Positive for rash.  Neurological:  Negative.   Endo/Heme/Allergies: Negative.   Psychiatric/Behavioral: Negative.      Physical Exam:  height is 5\' 4"  (1.626 m) and weight is 208 lb 1.9 oz (94.4 kg). Her temporal temperature is 97.5 F (36.4 C) (abnormal). Her blood pressure is 162/65 (abnormal) and her pulse is 75. Her respiration is 18 and oxygen saturation is 100%.   Wt Readings from Last 3 Encounters:  07/12/19 208 lb 1.9 oz (94.4 kg)  04/11/19 206 lb (93.4 kg)  02/07/19 205 lb (93 kg)    Physical Exam Vitals signs reviewed.  Constitutional:      Comments: Right chest wall shows the mastectomy site.  She has radiation telangiectasias on the right anterior chest wall.  She has some areas of poor healing.  No bleeding is noted.  There is no exudate.  There is no fluid.  There is no right axillary adenopathy.  Left breast is without masses, edema or erythema.  There is no left axillary adenopathy.  HENT:     Head: Normocephalic and atraumatic.  Eyes:     Pupils: Pupils are equal, round, and reactive to light.  Neck:     Musculoskeletal: Normal range of motion.  Cardiovascular:     Rate and Rhythm: Normal rate and regular rhythm.     Heart sounds: Normal heart sounds.  Pulmonary:     Effort: Pulmonary effort is normal.     Breath sounds: Normal breath sounds.  Abdominal:     General: Bowel sounds are normal.     Palpations: Abdomen is soft.  Musculoskeletal: Normal range of motion.        General: No tenderness or deformity.  Lymphadenopathy:     Cervical: No cervical adenopathy.  Skin:    General: Skin is warm and dry.     Findings: No erythema or rash.  Neurological:     Mental Status: She is alert and oriented to person, place, and time.  Psychiatric:        Behavior: Behavior normal.        Thought Content: Thought content normal.        Judgment: Judgment normal.      Lab Results  Component Value Date   WBC 6.6 07/12/2019   HGB 11.3 (L) 07/12/2019   HCT 35.9 (L) 07/12/2019   MCV 94.7  07/12/2019   PLT 207 07/12/2019   Lab Results  Component Value Date   FERRITIN 504 (H) 02/07/2019   IRON 53 02/07/2019   TIBC 230 (L) 02/07/2019   UIBC 177 02/07/2019   IRONPCTSAT 23 02/07/2019   Lab Results  Component Value Date   RETICCTPCT 1.4 06/20/2011   RBC 3.79 (L) 07/12/2019   RETICCTABS 55.2 06/20/2011   No  results found for: KPAFRELGTCHN, LAMBDASER, KAPLAMBRATIO No results found for: Kandis Cocking [facility] Lab Results  Component Value Date   TOTALPROTELP 6.6 08/14/2010   ALBUMINELP 53.8 (L) 08/14/2010   A1GS 5.3 (H) 08/14/2010   A2GS 14.3 (H) 08/14/2010   BETS 5.8 08/14/2010   BETA2SER 6.3 08/14/2010   GAMS 14.5 08/14/2010   MSPIKE NOT DET 08/14/2010   SPEI * 08/14/2010     Chemistry      Component Value Date/Time   NA 143 07/12/2019 0919   NA 146 (H) 07/23/2017 0900   NA 144 02/18/2017 0901   K 4.1 07/12/2019 0919   K 3.7 07/23/2017 0900   K 3.7 02/18/2017 0901   CL 105 07/12/2019 0919   CL 107 07/23/2017 0900   CO2 32 07/12/2019 0919   CO2 31 07/23/2017 0900   CO2 27 02/18/2017 0901   BUN 22 07/12/2019 0919   BUN 16 07/23/2017 0900   BUN 17.7 02/18/2017 0901   CREATININE 0.77 07/12/2019 0919   CREATININE 0.8 07/23/2017 0900   CREATININE 0.7 02/18/2017 0901      Component Value Date/Time   CALCIUM 10.0 07/12/2019 0919   CALCIUM 9.3 07/23/2017 0900   CALCIUM 9.3 02/18/2017 0901   ALKPHOS 63 07/12/2019 0919   ALKPHOS 67 07/23/2017 0900   ALKPHOS 78 02/18/2017 0901   AST 19 07/12/2019 0919   AST 13 02/18/2017 0901   ALT 21 07/12/2019 0919   ALT 20 07/23/2017 0900   ALT 12 02/18/2017 0901   BILITOT 0.4 07/12/2019 0919   BILITOT 0.54 02/18/2017 0901      Impression and Plan: [name] is a very pleasant 68 yo caucasian female with history of locally recurrent adenocarcinoma of the right breast with resection and radiation. She is doing well on Aromasin and so far there has been no evidence of recurrence.   We will now get her through the  holidays.  I think we can get her through the holiday season.  We will have to see how her iron studies look when we see her back.  She will get her flu shot today.  [name], MD 9/29/202010:12 AM

## 2019-07-12 NOTE — Telephone Encounter (Signed)
-----   Message from [name], MD sent at 07/12/2019  1:04 PM EDT ----- Call - the iron level is ok!!  [name]

## 2019-07-13 DIAGNOSIS — E669 Obesity, unspecified: Secondary | ICD-10-CM | POA: Diagnosis not present

## 2019-07-13 DIAGNOSIS — I10 Essential (primary) hypertension: Secondary | ICD-10-CM | POA: Diagnosis not present

## 2019-07-13 DIAGNOSIS — E782 Mixed hyperlipidemia: Secondary | ICD-10-CM | POA: Diagnosis not present

## 2019-07-13 DIAGNOSIS — Z6836 Body mass index (BMI) 36.0-36.9, adult: Secondary | ICD-10-CM | POA: Diagnosis not present

## 2019-07-13 LAB — CANCER ANTIGEN 27.29: CA 27.29: 22.3 U/mL (ref 0.0–38.6)

## 2019-08-12 DIAGNOSIS — Z6836 Body mass index (BMI) 36.0-36.9, adult: Secondary | ICD-10-CM | POA: Diagnosis not present

## 2019-08-12 DIAGNOSIS — E669 Obesity, unspecified: Secondary | ICD-10-CM | POA: Diagnosis not present

## 2019-08-12 DIAGNOSIS — I10 Essential (primary) hypertension: Secondary | ICD-10-CM | POA: Diagnosis not present

## 2019-08-12 DIAGNOSIS — E782 Mixed hyperlipidemia: Secondary | ICD-10-CM | POA: Diagnosis not present

## 2019-08-17 DIAGNOSIS — E782 Mixed hyperlipidemia: Secondary | ICD-10-CM | POA: Diagnosis not present

## 2019-08-17 DIAGNOSIS — I10 Essential (primary) hypertension: Secondary | ICD-10-CM | POA: Diagnosis not present

## 2019-08-24 DIAGNOSIS — R7301 Impaired fasting glucose: Secondary | ICD-10-CM | POA: Diagnosis not present

## 2019-08-24 DIAGNOSIS — I10 Essential (primary) hypertension: Secondary | ICD-10-CM | POA: Diagnosis not present

## 2019-08-24 DIAGNOSIS — I700 Atherosclerosis of aorta: Secondary | ICD-10-CM | POA: Diagnosis not present

## 2019-08-24 DIAGNOSIS — E782 Mixed hyperlipidemia: Secondary | ICD-10-CM | POA: Diagnosis not present

## 2019-09-12 DIAGNOSIS — E782 Mixed hyperlipidemia: Secondary | ICD-10-CM | POA: Diagnosis not present

## 2019-09-12 DIAGNOSIS — I700 Atherosclerosis of aorta: Secondary | ICD-10-CM | POA: Diagnosis not present

## 2019-09-12 DIAGNOSIS — I10 Essential (primary) hypertension: Secondary | ICD-10-CM | POA: Diagnosis not present

## 2019-09-12 DIAGNOSIS — E669 Obesity, unspecified: Secondary | ICD-10-CM | POA: Diagnosis not present

## 2019-10-18 ENCOUNTER — Other Ambulatory Visit: Payer: Self-pay

## 2019-10-18 ENCOUNTER — Telehealth: Payer: Self-pay | Admitting: Hematology & Oncology

## 2019-10-18 ENCOUNTER — Inpatient Hospital Stay: Payer: Medicare Other

## 2019-10-18 ENCOUNTER — Inpatient Hospital Stay: Payer: Medicare Other | Attending: Hematology & Oncology | Admitting: Hematology & Oncology

## 2019-10-18 ENCOUNTER — Encounter: Payer: Self-pay | Admitting: Hematology & Oncology

## 2019-10-18 VITALS — BP 154/72 | HR 84 | Temp 97.1°F | Resp 18 | Wt 212.5 lb

## 2019-10-18 DIAGNOSIS — C50011 Malignant neoplasm of nipple and areola, right female breast: Secondary | ICD-10-CM

## 2019-10-18 DIAGNOSIS — Z7982 Long term (current) use of aspirin: Secondary | ICD-10-CM | POA: Diagnosis not present

## 2019-10-18 DIAGNOSIS — D500 Iron deficiency anemia secondary to blood loss (chronic): Secondary | ICD-10-CM

## 2019-10-18 DIAGNOSIS — Z86718 Personal history of other venous thrombosis and embolism: Secondary | ICD-10-CM | POA: Insufficient documentation

## 2019-10-18 DIAGNOSIS — C50911 Malignant neoplasm of unspecified site of right female breast: Secondary | ICD-10-CM | POA: Insufficient documentation

## 2019-10-18 DIAGNOSIS — Z79899 Other long term (current) drug therapy: Secondary | ICD-10-CM | POA: Insufficient documentation

## 2019-10-18 DIAGNOSIS — Z79811 Long term (current) use of aromatase inhibitors: Secondary | ICD-10-CM | POA: Insufficient documentation

## 2019-10-18 DIAGNOSIS — Z170 Estrogen receptor positive status [ER+]: Secondary | ICD-10-CM | POA: Insufficient documentation

## 2019-10-18 LAB — CMP (CANCER CENTER ONLY)
ALT: 20 U/L (ref 0–44)
AST: 16 U/L (ref 15–41)
Albumin: 4.2 g/dL (ref 3.5–5.0)
Alkaline Phosphatase: 62 U/L (ref 38–126)
Anion gap: 8 (ref 5–15)
BUN: 15 mg/dL (ref 8–23)
CO2: 30 mmol/L (ref 22–32)
Calcium: 9.6 mg/dL (ref 8.9–10.3)
Chloride: 106 mmol/L (ref 98–111)
Creatinine: 0.75 mg/dL (ref 0.44–1.00)
GFR, Est AFR Am: >60 mL/min (ref >60)
GFR, Estimated: >60 mL/min (ref >60)
Glucose, Bld: 115 mg/dL — ABNORMAL HIGH (ref 70–99)
Potassium: 4.2 mmol/L (ref 3.5–5.1)
Sodium: 144 mmol/L (ref 135–145)
Total Bilirubin: 0.5 mg/dL (ref 0.3–1.2)
Total Protein: 7.3 g/dL (ref 6.5–8.1)

## 2019-10-18 LAB — CBC WITH DIFFERENTIAL (CANCER CENTER ONLY)
Abs Immature Granulocytes: 0.02 10*3/uL (ref 0.00–0.07)
Basophils Absolute: 0 10*3/uL (ref 0.0–0.1)
Basophils Relative: 0 %
Eosinophils Absolute: 0.3 10*3/uL (ref 0.0–0.5)
Eosinophils Relative: 4 %
HCT: 36.8 % (ref 36.0–46.0)
Hemoglobin: 11.6 g/dL — ABNORMAL LOW (ref 12.0–15.0)
Immature Granulocytes: 0 %
Lymphocytes Relative: 26 %
Lymphs Abs: 1.9 10*3/uL (ref 0.7–4.0)
MCH: 29.4 pg (ref 26.0–34.0)
MCHC: 31.5 g/dL (ref 30.0–36.0)
MCV: 93.4 fL (ref 80.0–100.0)
Monocytes Absolute: 0.6 10*3/uL (ref 0.1–1.0)
Monocytes Relative: 8 %
Neutro Abs: 4.6 10*3/uL (ref 1.7–7.7)
Neutrophils Relative %: 62 %
Platelet Count: 226 10*3/uL (ref 150–400)
RBC: 3.94 MIL/uL (ref 3.87–5.11)
RDW: 13.2 % (ref 11.5–15.5)
WBC Count: 7.4 10*3/uL (ref 4.0–10.5)
nRBC: 0 % (ref 0.0–0.2)

## 2019-10-18 LAB — IRON AND TIBC
Iron: 53 ug/dL (ref 41–142)
Saturation Ratios: 24 % (ref 21–57)
TIBC: 221 ug/dL — ABNORMAL LOW (ref 236–444)
UIBC: 169 ug/dL (ref 120–384)

## 2019-10-18 LAB — FERRITIN: Ferritin: 535 ng/mL — ABNORMAL HIGH (ref 11–307)

## 2019-10-18 NOTE — Telephone Encounter (Signed)
Appointments scheduled calendar printed per 1/5 los

## 2019-10-18 NOTE — Progress Notes (Signed)
Hematology and Oncology Follow Up Visit  [name] [id number] March 16, 1951 69 y.o. 10/18/2019   Principle Diagnosis:  Locally recurrent adenocarcinoma of the right breast History of superficial venous thrombus of the right thigh  Current Therapy:   1. Aromasin 25 mg p.o. daily 2. Aspirin 81 mg p.o. daily   Interim History:  [name] is here today for follow-up.  She is doing pretty well.  She had a nice holiday season.  As always, because of the coronavirus, she was very cautious.  She is had no problems with respect to the dermatitis on the right anterior chest wall.  Unfortunately, there can be little that is done for this.  She is putting some cream on there is so it does not open up and bleed.  She has had no problems with cough.  There is no fever.  She has had no change in bowel or bladder habits.  She is not too happy about taking the iron pills.  I told her she can take the iron pills twice a week now.  I think that they are working.  Her last iron studies showed iron saturation of 30%.  She has had no issues with swelling outside of the lymphedema on the right arm.  Her blood pressure medications are being adjusted by her family doctor.  She has had no headache.  Overall, her performance status is ECOG 1.   Medications:  Allergies as of 10/18/2019      Reactions   Contrast Media [iodinated Diagnostic Agents] Hives, Shortness Of Breath, Nausea And Vomiting   Allergic reaction even after pre-meds.   Metrizamide Hives, Shortness Of Breath, Nausea And Vomiting   Allergic reaction even after pre-meds.   Other Shortness Of Breath, Nausea And Vomiting, Rash   Allergic reaction even after pre-meds.   Brilliant Blue Fcf Hives, Nausea Only   Dye Fdc Blue [brilliant Blue Fcf (fd&c Blue #1)] Hives, Nausea Only      Medication List       Accurate as of October 18, 2019  9:57 AM. If you have any questions, ask your nurse or doctor.        amLODipine 5 MG tablet Commonly  known as: NORVASC Take 5 mg by mouth daily.   aspirin EC 81 MG tablet Take 81 mg by mouth daily.   candesartan 32 MG tablet Commonly known as: ATACAND Take 32 mg by mouth daily.   exemestane 25 MG tablet Commonly known as: AROMASIN TAKE 1 TABLET BY MOUTH EVERY DAY   FUSION PLUS PO Take 130 mg by mouth once a week.   hydrochlorothiazide 25 MG tablet Commonly known as: HYDRODIURIL Take 25 mg by mouth daily.   ibuprofen 200 MG tablet Commonly known as: ADVIL Take 200 mg by mouth every 6 (six) hours as needed.   IRON PO Take by mouth.   metoprolol succinate 50 MG 24 hr tablet Commonly known as: TOPROL-XL Take 50 mg by mouth daily.   silver sulfADIAZINE 1 % cream Commonly known as: Silvadene Apply 1 application topically daily.   simvastatin 20 MG tablet Commonly known as: ZOCOR Take 20 mg by mouth daily.   Vitamin D (Ergocalciferol) 1.25 MG (50000 UT) Caps capsule Commonly known as: DRISDOL TAKE ONE CAPSULE BY MOUTH ONE TIME PER WEEK       Allergies:  Allergies  Allergen Reactions  . Contrast Media [Iodinated Diagnostic Agents] Hives, Shortness Of Breath and Nausea And Vomiting    Allergic reaction even after pre-meds.  Marland Kitchen  Metrizamide Hives, Shortness Of Breath and Nausea And Vomiting    Allergic reaction even after pre-meds.  . Other Shortness Of Breath, Nausea And Vomiting and Rash    Allergic reaction even after pre-meds.  Dillard [place] Blue Fcf Hives and Nausea Only  . Dye Fdc Blue [Brilliant Blue Fcf (Fd&C Blue #1)] Hives and Nausea Only    Past Medical History, Surgical history, Social history, and Family History were reviewed and updated.  Review of Systems: Review of Systems  Constitutional: Negative.   HENT: Negative.   Eyes: Negative.   Respiratory: Negative.   Cardiovascular: Negative.   Gastrointestinal: Negative.   Genitourinary: Negative.   Musculoskeletal: Positive for back pain.  Skin: Positive for rash.  Neurological: Negative.     Endo/Heme/Allergies: Negative.   Psychiatric/Behavioral: Negative.      Physical Exam:  weight is 212 lb 8 oz (96.4 kg). Her temporal temperature is 97.1 F (36.2 C) (abnormal). Her blood pressure is 154/72 (abnormal) and her pulse is 84. Her respiration is 18 and oxygen saturation is 100%.   Wt Readings from Last 3 Encounters:  10/18/19 212 lb 8 oz (96.4 kg)  07/12/19 208 lb 1.9 oz (94.4 kg)  04/11/19 206 lb (93.4 kg)    Physical Exam Vitals reviewed.  Constitutional:      Comments: Right chest wall shows the mastectomy site.  She has radiation telangiectasias on the right anterior chest wall.  She has some areas of poor healing.  No bleeding is noted.  There is no exudate.  There is no fluid.  There is no right axillary adenopathy.  Left breast is without masses, edema or erythema.  There is no left axillary adenopathy.  HENT:     Head: Normocephalic and atraumatic.  Eyes:     Pupils: Pupils are equal, round, and reactive to light.  Cardiovascular:     Rate and Rhythm: Normal rate and regular rhythm.     Heart sounds: Normal heart sounds.  Pulmonary:     Effort: Pulmonary effort is normal.     Breath sounds: Normal breath sounds.  Abdominal:     General: Bowel sounds are normal.     Palpations: Abdomen is soft.  Musculoskeletal:        General: No tenderness or deformity. Normal range of motion.     Cervical back: Normal range of motion.  Lymphadenopathy:     Cervical: No cervical adenopathy.  Skin:    General: Skin is warm and dry.     Findings: No erythema or rash.  Neurological:     Mental Status: She is alert and oriented to person, place, and time.  Psychiatric:        Behavior: Behavior normal.        Thought Content: Thought content normal.        Judgment: Judgment normal.      Lab Results  Component Value Date   WBC 7.4 10/18/2019   HGB 11.6 (L) 10/18/2019   HCT 36.8 10/18/2019   MCV 93.4 10/18/2019   PLT 226 10/18/2019   Lab Results  Component  Value Date   FERRITIN 572 (H) 07/12/2019   IRON 65 07/12/2019   TIBC 218 (L) 07/12/2019   UIBC 153 07/12/2019   IRONPCTSAT 30 07/12/2019   Lab Results  Component Value Date   RETICCTPCT 1.4 06/20/2011   RBC 3.94 10/18/2019   RETICCTABS 55.2 06/20/2011   No results found for: KPAFRELGTCHN, LAMBDASER, KAPLAMBRATIO No results found for: IGGSERUM, IGA, IGMSERUM Lab  Results  Component Value Date   TOTALPROTELP 6.6 08/14/2010   ALBUMINELP 53.8 (L) 08/14/2010   A1GS 5.3 (H) 08/14/2010   A2GS 14.3 (H) 08/14/2010   BETS 5.8 08/14/2010   BETA2SER 6.3 08/14/2010   GAMS 14.5 08/14/2010   MSPIKE NOT DET 08/14/2010   SPEI * 08/14/2010     Chemistry      Component Value Date/Time   NA 144 10/18/2019 0851   NA 146 (H) 07/23/2017 0900   NA 144 02/18/2017 0901   K 4.2 10/18/2019 0851   K 3.7 07/23/2017 0900   K 3.7 02/18/2017 0901   CL 106 10/18/2019 0851   CL 107 07/23/2017 0900   CO2 30 10/18/2019 0851   CO2 31 07/23/2017 0900   CO2 27 02/18/2017 0901   BUN 15 10/18/2019 0851   BUN 16 07/23/2017 0900   BUN 17.7 02/18/2017 0901   CREATININE 0.75 10/18/2019 0851   CREATININE 0.8 07/23/2017 0900   CREATININE 0.7 02/18/2017 0901      Component Value Date/Time   CALCIUM 9.6 10/18/2019 0851   CALCIUM 9.3 07/23/2017 0900   CALCIUM 9.3 02/18/2017 0901   ALKPHOS 62 10/18/2019 0851   ALKPHOS 67 07/23/2017 0900   ALKPHOS 78 02/18/2017 0901   AST 16 10/18/2019 0851   AST 13 02/18/2017 0901   ALT 20 10/18/2019 0851   ALT 20 07/23/2017 0900   ALT 12 02/18/2017 0901   BILITOT 0.5 10/18/2019 0851   BILITOT 0.54 02/18/2017 0901      Impression and Plan: [name] is a very pleasant 69 yo caucasian female with history of locally recurrent adenocarcinoma of the right breast with resection and radiation. She is doing well on Aromasin and so far there has been no evidence of recurrence.   Hopefully, she will be able to get the coronavirus vaccine in the next month or so.  We will see  what her iron studies look like.  We will plan to get her back in about 4 months now.    [name], MD 1/5/20219:57 AM

## 2019-10-19 DIAGNOSIS — Z6837 Body mass index (BMI) 37.0-37.9, adult: Secondary | ICD-10-CM | POA: Diagnosis not present

## 2019-10-19 DIAGNOSIS — I10 Essential (primary) hypertension: Secondary | ICD-10-CM | POA: Diagnosis not present

## 2019-11-10 DIAGNOSIS — I10 Essential (primary) hypertension: Secondary | ICD-10-CM | POA: Diagnosis not present

## 2019-11-11 DIAGNOSIS — E669 Obesity, unspecified: Secondary | ICD-10-CM | POA: Diagnosis not present

## 2019-11-11 DIAGNOSIS — I10 Essential (primary) hypertension: Secondary | ICD-10-CM | POA: Diagnosis not present

## 2019-11-11 DIAGNOSIS — E782 Mixed hyperlipidemia: Secondary | ICD-10-CM | POA: Diagnosis not present

## 2019-11-11 DIAGNOSIS — I700 Atherosclerosis of aorta: Secondary | ICD-10-CM | POA: Diagnosis not present

## 2019-11-16 ENCOUNTER — Other Ambulatory Visit: Payer: Self-pay | Admitting: *Deleted

## 2019-11-16 DIAGNOSIS — C50011 Malignant neoplasm of nipple and areola, right female breast: Secondary | ICD-10-CM

## 2019-11-16 MED ORDER — EXEMESTANE 25 MG PO TABS: 25.0000 mg | ORAL_TABLET | Freq: Every day | ORAL | 3 refills | Status: DC

## 2019-11-18 DIAGNOSIS — I10 Essential (primary) hypertension: Secondary | ICD-10-CM | POA: Diagnosis not present

## 2019-11-18 DIAGNOSIS — Z6837 Body mass index (BMI) 37.0-37.9, adult: Secondary | ICD-10-CM | POA: Diagnosis not present

## 2019-12-11 DIAGNOSIS — I10 Essential (primary) hypertension: Secondary | ICD-10-CM | POA: Diagnosis not present

## 2019-12-11 DIAGNOSIS — I700 Atherosclerosis of aorta: Secondary | ICD-10-CM | POA: Diagnosis not present

## 2019-12-11 DIAGNOSIS — E782 Mixed hyperlipidemia: Secondary | ICD-10-CM | POA: Diagnosis not present

## 2020-01-11 DIAGNOSIS — I700 Atherosclerosis of aorta: Secondary | ICD-10-CM | POA: Diagnosis not present

## 2020-01-11 DIAGNOSIS — Z6837 Body mass index (BMI) 37.0-37.9, adult: Secondary | ICD-10-CM | POA: Diagnosis not present

## 2020-01-11 DIAGNOSIS — E782 Mixed hyperlipidemia: Secondary | ICD-10-CM | POA: Diagnosis not present

## 2020-01-11 DIAGNOSIS — I10 Essential (primary) hypertension: Secondary | ICD-10-CM | POA: Diagnosis not present

## 2020-01-16 ENCOUNTER — Other Ambulatory Visit: Payer: Self-pay

## 2020-01-16 ENCOUNTER — Encounter: Payer: Self-pay | Admitting: Family

## 2020-01-16 ENCOUNTER — Inpatient Hospital Stay (HOSPITAL_BASED_OUTPATIENT_CLINIC_OR_DEPARTMENT_OTHER): Payer: Medicare Other | Admitting: Family

## 2020-01-16 ENCOUNTER — Telehealth: Payer: Self-pay | Admitting: Nurse Practitioner

## 2020-01-16 ENCOUNTER — Inpatient Hospital Stay: Payer: Medicare Other | Attending: Hematology & Oncology

## 2020-01-16 ENCOUNTER — Other Ambulatory Visit: Payer: Self-pay | Admitting: Family

## 2020-01-16 VITALS — BP 144/54 | HR 64 | Temp 97.5°F | Resp 18 | Ht 64.0 in | Wt 207.0 lb

## 2020-01-16 DIAGNOSIS — D509 Iron deficiency anemia, unspecified: Secondary | ICD-10-CM | POA: Insufficient documentation

## 2020-01-16 DIAGNOSIS — C50911 Malignant neoplasm of unspecified site of right female breast: Secondary | ICD-10-CM | POA: Diagnosis not present

## 2020-01-16 DIAGNOSIS — C50011 Malignant neoplasm of nipple and areola, right female breast: Secondary | ICD-10-CM | POA: Diagnosis not present

## 2020-01-16 DIAGNOSIS — D500 Iron deficiency anemia secondary to blood loss (chronic): Secondary | ICD-10-CM

## 2020-01-16 DIAGNOSIS — Z79811 Long term (current) use of aromatase inhibitors: Secondary | ICD-10-CM | POA: Diagnosis not present

## 2020-01-16 DIAGNOSIS — Z923 Personal history of irradiation: Secondary | ICD-10-CM | POA: Insufficient documentation

## 2020-01-16 LAB — CBC WITH DIFFERENTIAL (CANCER CENTER ONLY)
Abs Immature Granulocytes: 0.02 10*3/uL (ref 0.00–0.07)
Basophils Absolute: 0 10*3/uL (ref 0.0–0.1)
Basophils Relative: 1 %
Eosinophils Absolute: 0.3 10*3/uL (ref 0.0–0.5)
Eosinophils Relative: 4 %
HCT: 36.6 % (ref 36.0–46.0)
Hemoglobin: 11.3 g/dL — ABNORMAL LOW (ref 12.0–15.0)
Immature Granulocytes: 0 %
Lymphocytes Relative: 29 %
Lymphs Abs: 2.2 10*3/uL (ref 0.7–4.0)
MCH: 28.9 pg (ref 26.0–34.0)
MCHC: 30.9 g/dL (ref 30.0–36.0)
MCV: 93.6 fL (ref 80.0–100.0)
Monocytes Absolute: 0.7 10*3/uL (ref 0.1–1.0)
Monocytes Relative: 9 %
Neutro Abs: 4.3 10*3/uL (ref 1.7–7.7)
Neutrophils Relative %: 57 %
Platelet Count: 200 10*3/uL (ref 150–400)
RBC: 3.91 MIL/uL (ref 3.87–5.11)
RDW: 13.3 % (ref 11.5–15.5)
WBC Count: 7.5 10*3/uL (ref 4.0–10.5)
nRBC: 0 % (ref 0.0–0.2)

## 2020-01-16 LAB — CMP (CANCER CENTER ONLY)
ALT: 19 U/L (ref 0–44)
AST: 18 U/L (ref 15–41)
Albumin: 4.3 g/dL (ref 3.5–5.0)
Alkaline Phosphatase: 61 U/L (ref 38–126)
Anion gap: 7 (ref 5–15)
BUN: 25 mg/dL — ABNORMAL HIGH (ref 8–23)
CO2: 30 mmol/L (ref 22–32)
Calcium: 9.7 mg/dL (ref 8.9–10.3)
Chloride: 107 mmol/L (ref 98–111)
Creatinine: 0.87 mg/dL (ref 0.44–1.00)
GFR, Est AFR Am: >60 mL/min (ref >60)
GFR, Estimated: >60 mL/min (ref >60)
Glucose, Bld: 118 mg/dL — ABNORMAL HIGH (ref 70–99)
Potassium: 4.6 mmol/L (ref 3.5–5.1)
Sodium: 144 mmol/L (ref 135–145)
Total Bilirubin: 0.5 mg/dL (ref 0.3–1.2)
Total Protein: 7.3 g/dL (ref 6.5–8.1)

## 2020-01-16 LAB — IRON AND TIBC
Iron: 46 ug/dL (ref 41–142)
Saturation Ratios: 19 % — ABNORMAL LOW (ref 21–57)
TIBC: 239 ug/dL (ref 236–444)
UIBC: 193 ug/dL (ref 120–384)

## 2020-01-16 LAB — FERRITIN: Ferritin: 457 ng/mL — ABNORMAL HIGH (ref 11–307)

## 2020-01-16 NOTE — Progress Notes (Signed)
Hematology and Oncology Follow Up Visit  [name] [id number] [birth year] 69 y.o. 01/16/2020   Principle Diagnosis:  Locally recurrent adenocarcinoma of the right breast History of superficial venous thrombus of the right thigh  Current Therapy:        1. Aromasin 25 mg p.o. daily 2. Aspirin 81 mg p.o. daily   Interim History:  [name] is here today for follow-up. She is doing well but is still having dermatitis on the right anterior chest wall off and on. She uses neosporin cream as needed.  No pain, bleeding, discharge or odor at the site to indicate infection.  The lymphedema in her right arm is stable/unchanged. This waxes and wanes.  She does have poor circulation in her legs and states that she was diagnosed with PVD. She has some swelling and tenderness in her ankles and feet.  No numbness or tingling in her extremities at this time.  No fever, chills, n/v, cough, dizziness, SOB, chest pain, palpitations, abdominal pain or changes in bowel or bladder habits.  No episodes of bleeding. No bruising or petechiae.  No falls or syncopal episodes to report.  She states that she has maintained a good appetite and is staying well hydrated. Her weight is stable.   ECOG Performance Status: 1 - Symptomatic but completely ambulatory  Medications:  Allergies as of 01/16/2020      Reactions   Contrast Media [iodinated Diagnostic Agents] Hives, Shortness Of Breath, Nausea And Vomiting   Allergic reaction even after pre-meds.   Metrizamide Hives, Shortness Of Breath, Nausea And Vomiting   Allergic reaction even after pre-meds.   Other Shortness Of Breath, Nausea And Vomiting, Rash   Allergic reaction even after pre-meds.   Brilliant Blue Fcf Hives, Nausea Only   Dye Fdc Blue [brilliant Blue Fcf (fd&c Blue #1)] Hives, Nausea Only      Medication List       Accurate as of January 16, 2020 10:25 AM. If you have any questions, ask your nurse or doctor.        STOP taking these  medications   IRON PO Stopped by: Laverna Peace, NP     TAKE these medications   amLODipine 10 MG tablet Commonly known as: NORVASC Take 10 mg by mouth daily. What changed: Another medication with the same name was removed. Continue taking this medication, and follow the directions you see here. Changed by: Laverna Peace, NP   aspirin EC 81 MG tablet Take 81 mg by mouth daily.   candesartan 32 MG tablet Commonly known as: ATACAND Take 32 mg by mouth daily.   exemestane 25 MG tablet Commonly known as: AROMASIN Take 1 tablet (25 mg total) by mouth daily.   FUSION PLUS PO Take 130 mg by mouth once a week.   hydrochlorothiazide 25 MG tablet Commonly known as: HYDRODIURIL Take 25 mg by mouth daily.   ibuprofen 200 MG tablet Commonly known as: ADVIL Take 200 mg by mouth every 6 (six) hours as needed.   metoprolol succinate 50 MG 24 hr tablet Commonly known as: TOPROL-XL Take 50 mg by mouth daily.   silver sulfADIAZINE 1 % cream Commonly known as: Silvadene Apply 1 application topically daily.   simvastatin 20 MG tablet Commonly known as: ZOCOR Take 20 mg by mouth daily.   Vitamin D (Ergocalciferol) 1.25 MG (50000 UNIT) Caps capsule Commonly known as: DRISDOL TAKE ONE CAPSULE BY MOUTH ONE TIME PER WEEK       Allergies:  Allergies  Allergen  Reactions  . Contrast Media [Iodinated Diagnostic Agents] Hives, Shortness Of Breath and Nausea And Vomiting    Allergic reaction even after pre-meds.  . Metrizamide Hives, Shortness Of Breath and Nausea And Vomiting    Allergic reaction even after pre-meds.  . Other Shortness Of Breath, Nausea And Vomiting and Rash    Allergic reaction even after pre-meds.  Dillard [place] Blue Fcf Hives and Nausea Only  . Dye Fdc Blue [Brilliant Blue Fcf (Fd&C Blue #1)] Hives and Nausea Only    Past Medical History, Surgical history, Social history, and Family History were reviewed and updated.  Review of Systems: All other 10 point  review of systems is negative.   Physical Exam:  height is 5\' 4"  (1.626 m) and weight is 207 lb (93.9 kg). Her temporal temperature is 97.5 F (36.4 C) (abnormal). Her blood pressure is 144/54 (abnormal) and her pulse is 64. Her respiration is 18 and oxygen saturation is 98%.   Wt Readings from Last 3 Encounters:  01/16/20 207 lb (93.9 kg)  10/18/19 212 lb 8 oz (96.4 kg)  07/12/19 208 lb 1.9 oz (94.4 kg)    Ocular: Sclerae unicteric, pupils equal, round and reactive to light Ear-nose-throat: Oropharynx clear, dentition fair Lymphatic: No cervical, supraclavicular or axillary adenopathy Lungs no rales or rhonchi, good excursion bilaterally Heart regular rate and rhythm, no murmur appreciated Abd soft, nontender, positive bowel sounds, no liver or spleen tip palpated on exam, no fluid wave  MSK no focal spinal tenderness, no joint edema Neuro: non-focal, well-oriented, appropriate affect Breasts: Right mastectomy intact. Has telangiectasia to the right chest wall from previous radiation therapy. She has 2 spots at the center that have scabbed over and she has neosporin on them. They appear to be healing nicely. Left breast exam unremarkable. No mass, lesion or rash noted.   Lab Results  Component Value Date   WBC 7.5 01/16/2020   HGB 11.3 (L) 01/16/2020   HCT 36.6 01/16/2020   MCV 93.6 01/16/2020   PLT 200 01/16/2020   Lab Results  Component Value Date   FERRITIN 535 (H) 10/18/2019   IRON 53 10/18/2019   TIBC 221 (L) 10/18/2019   UIBC 169 10/18/2019   IRONPCTSAT 24 10/18/2019   Lab Results  Component Value Date   RETICCTPCT 1.4 06/20/2011   RBC 3.91 01/16/2020   RETICCTABS 55.2 06/20/2011   No results found for: KPAFRELGTCHN, LAMBDASER, KAPLAMBRATIO No results found for: Kandis Cocking [facility] Lab Results  Component Value Date   TOTALPROTELP 6.6 08/14/2010   ALBUMINELP 53.8 (L) 08/14/2010   A1GS 5.3 (H) 08/14/2010   A2GS 14.3 (H) 08/14/2010   BETS 5.8 08/14/2010    BETA2SER 6.3 08/14/2010   GAMS 14.5 08/14/2010   MSPIKE NOT DET 08/14/2010   SPEI * 08/14/2010     Chemistry      Component Value Date/Time   NA 144 10/18/2019 0851   NA 146 (H) 07/23/2017 0900   NA 144 02/18/2017 0901   K 4.2 10/18/2019 0851   K 3.7 07/23/2017 0900   K 3.7 02/18/2017 0901   CL 106 10/18/2019 0851   CL 107 07/23/2017 0900   CO2 30 10/18/2019 0851   CO2 31 07/23/2017 0900   CO2 27 02/18/2017 0901   BUN 15 10/18/2019 0851   BUN 16 07/23/2017 0900   BUN 17.7 02/18/2017 0901   CREATININE 0.75 10/18/2019 0851   CREATININE 0.8 07/23/2017 0900   CREATININE 0.7 02/18/2017 0901      Component  Value Date/Time   CALCIUM 9.6 10/18/2019 0851   CALCIUM 9.3 07/23/2017 0900   CALCIUM 9.3 02/18/2017 0901   ALKPHOS 62 10/18/2019 0851   ALKPHOS 67 07/23/2017 0900   ALKPHOS 78 02/18/2017 0901   AST 16 10/18/2019 0851   AST 13 02/18/2017 0901   ALT 20 10/18/2019 0851   ALT 20 07/23/2017 0900   ALT 12 02/18/2017 0901   BILITOT 0.5 10/18/2019 0851   BILITOT 0.54 02/18/2017 0901       Impression and Plan: [name] is a very pleasant 69 yo caucasian female with history of locally recurrent adenocarcinoma of the right breast with resection and radiation.  She continues to do well on Aromasin and so far there has been no evidence of recurrence.  We will plan to see her back in another 4 months.  She will contact our office with any questions or concerns. We can certainly see her sooner if needed.   Laverna Peace, NP 4/5/202110:25 AM

## 2020-01-16 NOTE — Telephone Encounter (Signed)
Appointments scheduled calendar printed per 4/5 los 

## 2020-01-17 ENCOUNTER — Telehealth: Payer: Self-pay | Admitting: Family

## 2020-01-17 ENCOUNTER — Other Ambulatory Visit: Payer: Self-pay | Admitting: Family

## 2020-01-17 NOTE — Telephone Encounter (Signed)
Called and spoke with patient regarding appointment added per 4/6 sch msg

## 2020-01-19 ENCOUNTER — Other Ambulatory Visit: Payer: Self-pay

## 2020-01-19 ENCOUNTER — Inpatient Hospital Stay: Payer: Medicare Other

## 2020-01-19 VITALS — BP 144/61 | HR 64 | Temp 97.0°F | Resp 18

## 2020-01-19 DIAGNOSIS — D509 Iron deficiency anemia, unspecified: Secondary | ICD-10-CM | POA: Diagnosis not present

## 2020-01-19 DIAGNOSIS — C50911 Malignant neoplasm of unspecified site of right female breast: Secondary | ICD-10-CM | POA: Diagnosis not present

## 2020-01-19 DIAGNOSIS — Z923 Personal history of irradiation: Secondary | ICD-10-CM | POA: Diagnosis not present

## 2020-01-19 DIAGNOSIS — D508 Other iron deficiency anemias: Secondary | ICD-10-CM

## 2020-01-19 DIAGNOSIS — Z79811 Long term (current) use of aromatase inhibitors: Secondary | ICD-10-CM | POA: Diagnosis not present

## 2020-01-19 MED ORDER — SODIUM CHLORIDE 0.9 % IV SOLN
510.0000 mg | Freq: Once | INTRAVENOUS | Status: AC
  Administered 2020-01-19: 14:00:00 510 mg via INTRAVENOUS
  Filled 2020-01-19: qty 510

## 2020-01-19 MED ORDER — SODIUM CHLORIDE 0.9 % IV SOLN
Freq: Once | INTRAVENOUS | Status: AC
  Filled 2020-01-19: qty 250

## 2020-01-19 NOTE — Patient Instructions (Signed)

## 2020-01-31 DIAGNOSIS — E669 Obesity, unspecified: Secondary | ICD-10-CM | POA: Diagnosis not present

## 2020-01-31 DIAGNOSIS — Z6837 Body mass index (BMI) 37.0-37.9, adult: Secondary | ICD-10-CM | POA: Diagnosis not present

## 2020-02-07 DIAGNOSIS — E669 Obesity, unspecified: Secondary | ICD-10-CM | POA: Diagnosis not present

## 2020-02-07 DIAGNOSIS — Z6837 Body mass index (BMI) 37.0-37.9, adult: Secondary | ICD-10-CM | POA: Diagnosis not present

## 2020-02-10 DIAGNOSIS — I700 Atherosclerosis of aorta: Secondary | ICD-10-CM | POA: Diagnosis not present

## 2020-02-10 DIAGNOSIS — E669 Obesity, unspecified: Secondary | ICD-10-CM | POA: Diagnosis not present

## 2020-02-10 DIAGNOSIS — E782 Mixed hyperlipidemia: Secondary | ICD-10-CM | POA: Diagnosis not present

## 2020-02-10 DIAGNOSIS — I10 Essential (primary) hypertension: Secondary | ICD-10-CM | POA: Diagnosis not present

## 2020-02-14 DIAGNOSIS — E669 Obesity, unspecified: Secondary | ICD-10-CM | POA: Diagnosis not present

## 2020-02-14 DIAGNOSIS — Z6837 Body mass index (BMI) 37.0-37.9, adult: Secondary | ICD-10-CM | POA: Diagnosis not present

## 2020-02-21 DIAGNOSIS — Z6837 Body mass index (BMI) 37.0-37.9, adult: Secondary | ICD-10-CM | POA: Diagnosis not present

## 2020-02-21 DIAGNOSIS — E669 Obesity, unspecified: Secondary | ICD-10-CM | POA: Diagnosis not present

## 2020-03-01 DIAGNOSIS — B351 Tinea unguium: Secondary | ICD-10-CM | POA: Diagnosis not present

## 2020-03-09 DIAGNOSIS — E669 Obesity, unspecified: Secondary | ICD-10-CM | POA: Diagnosis not present

## 2020-03-09 DIAGNOSIS — Z1331 Encounter for screening for depression: Secondary | ICD-10-CM | POA: Diagnosis not present

## 2020-03-09 DIAGNOSIS — Z0000 Encounter for general adult medical examination without abnormal findings: Secondary | ICD-10-CM | POA: Diagnosis not present

## 2020-03-09 DIAGNOSIS — Z6837 Body mass index (BMI) 37.0-37.9, adult: Secondary | ICD-10-CM | POA: Diagnosis not present

## 2020-03-09 DIAGNOSIS — Z136 Encounter for screening for cardiovascular disorders: Secondary | ICD-10-CM | POA: Diagnosis not present

## 2020-03-09 DIAGNOSIS — Z1339 Encounter for screening examination for other mental health and behavioral disorders: Secondary | ICD-10-CM | POA: Diagnosis not present

## 2020-03-09 DIAGNOSIS — Z139 Encounter for screening, unspecified: Secondary | ICD-10-CM | POA: Diagnosis not present

## 2020-03-09 DIAGNOSIS — Z7189 Other specified counseling: Secondary | ICD-10-CM | POA: Diagnosis not present

## 2020-03-09 DIAGNOSIS — E782 Mixed hyperlipidemia: Secondary | ICD-10-CM | POA: Diagnosis not present

## 2020-03-09 DIAGNOSIS — I10 Essential (primary) hypertension: Secondary | ICD-10-CM | POA: Diagnosis not present

## 2020-03-12 DIAGNOSIS — I700 Atherosclerosis of aorta: Secondary | ICD-10-CM | POA: Diagnosis not present

## 2020-03-12 DIAGNOSIS — E782 Mixed hyperlipidemia: Secondary | ICD-10-CM | POA: Diagnosis not present

## 2020-03-12 DIAGNOSIS — E669 Obesity, unspecified: Secondary | ICD-10-CM | POA: Diagnosis not present

## 2020-03-12 DIAGNOSIS — I10 Essential (primary) hypertension: Secondary | ICD-10-CM | POA: Diagnosis not present

## 2020-03-13 ENCOUNTER — Other Ambulatory Visit: Payer: Self-pay | Admitting: Hematology & Oncology

## 2020-03-13 DIAGNOSIS — C50011 Malignant neoplasm of nipple and areola, right female breast: Secondary | ICD-10-CM

## 2020-03-16 DIAGNOSIS — E782 Mixed hyperlipidemia: Secondary | ICD-10-CM | POA: Diagnosis not present

## 2020-03-16 DIAGNOSIS — I700 Atherosclerosis of aorta: Secondary | ICD-10-CM | POA: Diagnosis not present

## 2020-03-16 DIAGNOSIS — Z6834 Body mass index (BMI) 34.0-34.9, adult: Secondary | ICD-10-CM | POA: Diagnosis not present

## 2020-03-16 DIAGNOSIS — I10 Essential (primary) hypertension: Secondary | ICD-10-CM | POA: Diagnosis not present

## 2020-03-20 ENCOUNTER — Telehealth: Payer: Self-pay | Admitting: *Deleted

## 2020-03-20 DIAGNOSIS — E669 Obesity, unspecified: Secondary | ICD-10-CM | POA: Diagnosis not present

## 2020-03-20 DIAGNOSIS — Z6834 Body mass index (BMI) 34.0-34.9, adult: Secondary | ICD-10-CM | POA: Diagnosis not present

## 2020-03-20 NOTE — Telephone Encounter (Signed)
Message received from patient requesting a refill of Fusion Plus.  Call placed back to patient and patient informed per order of S. Lockland NP that she does not need to take Fusion Plus anymore.  Pt appreciative of call back and has no further questions at this time.

## 2020-04-02 ENCOUNTER — Other Ambulatory Visit: Payer: Self-pay | Admitting: Hematology & Oncology

## 2020-04-02 DIAGNOSIS — Z1231 Encounter for screening mammogram for malignant neoplasm of breast: Secondary | ICD-10-CM

## 2020-04-14 ENCOUNTER — Other Ambulatory Visit: Payer: Self-pay | Admitting: Hematology & Oncology

## 2020-04-23 DIAGNOSIS — H938X1 Other specified disorders of right ear: Secondary | ICD-10-CM | POA: Diagnosis not present

## 2020-04-23 DIAGNOSIS — Z6833 Body mass index (BMI) 33.0-33.9, adult: Secondary | ICD-10-CM | POA: Diagnosis not present

## 2020-04-23 DIAGNOSIS — H60501 Unspecified acute noninfective otitis externa, right ear: Secondary | ICD-10-CM | POA: Diagnosis not present

## 2020-04-24 DIAGNOSIS — H938X2 Other specified disorders of left ear: Secondary | ICD-10-CM | POA: Diagnosis not present

## 2020-04-24 DIAGNOSIS — S00411A Abrasion of right ear, initial encounter: Secondary | ICD-10-CM | POA: Diagnosis not present

## 2020-04-30 DIAGNOSIS — H7191 Unspecified cholesteatoma, right ear: Secondary | ICD-10-CM | POA: Diagnosis not present

## 2020-04-30 DIAGNOSIS — Z6832 Body mass index (BMI) 32.0-32.9, adult: Secondary | ICD-10-CM | POA: Diagnosis not present

## 2020-05-13 DIAGNOSIS — E782 Mixed hyperlipidemia: Secondary | ICD-10-CM | POA: Diagnosis not present

## 2020-05-13 DIAGNOSIS — I10 Essential (primary) hypertension: Secondary | ICD-10-CM | POA: Diagnosis not present

## 2020-05-13 DIAGNOSIS — I700 Atherosclerosis of aorta: Secondary | ICD-10-CM | POA: Diagnosis not present

## 2020-05-13 DIAGNOSIS — E669 Obesity, unspecified: Secondary | ICD-10-CM | POA: Diagnosis not present

## 2020-05-15 ENCOUNTER — Other Ambulatory Visit: Payer: Self-pay

## 2020-05-15 ENCOUNTER — Ambulatory Visit
Admission: RE | Admit: 2020-05-15 | Discharge: 2020-05-15 | Disposition: A | Discharge status: Home / Self Care | Payer: Medicare Other | Source: Ambulatory Visit | Attending: Hematology & Oncology | Admitting: Hematology & Oncology

## 2020-05-15 DIAGNOSIS — Z1231 Encounter for screening mammogram for malignant neoplasm of breast: Secondary | ICD-10-CM

## 2020-05-15 DIAGNOSIS — H0014 Chalazion left upper eyelid: Secondary | ICD-10-CM | POA: Diagnosis not present

## 2020-05-15 DIAGNOSIS — Z6831 Body mass index (BMI) 31.0-31.9, adult: Secondary | ICD-10-CM | POA: Diagnosis not present

## 2020-05-16 DIAGNOSIS — H0102A Squamous blepharitis right eye, upper and lower eyelids: Secondary | ICD-10-CM | POA: Diagnosis not present

## 2020-05-16 DIAGNOSIS — H00014 Hordeolum externum left upper eyelid: Secondary | ICD-10-CM | POA: Diagnosis not present

## 2020-05-16 DIAGNOSIS — H0102B Squamous blepharitis left eye, upper and lower eyelids: Secondary | ICD-10-CM | POA: Diagnosis not present

## 2020-05-17 ENCOUNTER — Inpatient Hospital Stay: Payer: Medicare Other | Attending: Hematology & Oncology

## 2020-05-17 ENCOUNTER — Other Ambulatory Visit: Payer: Self-pay

## 2020-05-17 ENCOUNTER — Inpatient Hospital Stay (HOSPITAL_BASED_OUTPATIENT_CLINIC_OR_DEPARTMENT_OTHER): Payer: Medicare Other | Admitting: Hematology & Oncology

## 2020-05-17 ENCOUNTER — Encounter: Payer: Self-pay | Admitting: Hematology & Oncology

## 2020-05-17 VITALS — BP 147/58 | HR 84 | Temp 98.7°F | Resp 16 | Wt 178.0 lb

## 2020-05-17 DIAGNOSIS — D500 Iron deficiency anemia secondary to blood loss (chronic): Secondary | ICD-10-CM

## 2020-05-17 DIAGNOSIS — Z923 Personal history of irradiation: Secondary | ICD-10-CM | POA: Diagnosis not present

## 2020-05-17 DIAGNOSIS — C50011 Malignant neoplasm of nipple and areola, right female breast: Secondary | ICD-10-CM

## 2020-05-17 DIAGNOSIS — Z79811 Long term (current) use of aromatase inhibitors: Secondary | ICD-10-CM | POA: Diagnosis not present

## 2020-05-17 DIAGNOSIS — C50911 Malignant neoplasm of unspecified site of right female breast: Secondary | ICD-10-CM | POA: Insufficient documentation

## 2020-05-17 DIAGNOSIS — Z7982 Long term (current) use of aspirin: Secondary | ICD-10-CM | POA: Insufficient documentation

## 2020-05-17 LAB — CBC WITH DIFFERENTIAL (CANCER CENTER ONLY)
Abs Immature Granulocytes: 0.01 10*3/uL (ref 0.00–0.07)
Basophils Absolute: 0 10*3/uL (ref 0.0–0.1)
Basophils Relative: 1 %
Eosinophils Absolute: 0.2 10*3/uL (ref 0.0–0.5)
Eosinophils Relative: 3 %
HCT: 35.6 % — ABNORMAL LOW (ref 36.0–46.0)
Hemoglobin: 11.3 g/dL — ABNORMAL LOW (ref 12.0–15.0)
Immature Granulocytes: 0 %
Lymphocytes Relative: 32 %
Lymphs Abs: 2 10*3/uL (ref 0.7–4.0)
MCH: 29.8 pg (ref 26.0–34.0)
MCHC: 31.7 g/dL (ref 30.0–36.0)
MCV: 93.9 fL (ref 80.0–100.0)
Monocytes Absolute: 0.5 10*3/uL (ref 0.1–1.0)
Monocytes Relative: 8 %
Neutro Abs: 3.7 10*3/uL (ref 1.7–7.7)
Neutrophils Relative %: 56 %
Platelet Count: 179 10*3/uL (ref 150–400)
RBC: 3.79 MIL/uL — ABNORMAL LOW (ref 3.87–5.11)
RDW: 13.2 % (ref 11.5–15.5)
WBC Count: 6.4 10*3/uL (ref 4.0–10.5)
nRBC: 0 % (ref 0.0–0.2)

## 2020-05-17 LAB — CMP (CANCER CENTER ONLY)
ALT: 12 U/L (ref 0–44)
AST: 13 U/L — ABNORMAL LOW (ref 15–41)
Albumin: 4.3 g/dL (ref 3.5–5.0)
Alkaline Phosphatase: 58 U/L (ref 38–126)
Anion gap: 6 (ref 5–15)
BUN: 23 mg/dL (ref 8–23)
CO2: 31 mmol/L (ref 22–32)
Calcium: 9.8 mg/dL (ref 8.9–10.3)
Chloride: 107 mmol/L (ref 98–111)
Creatinine: 0.78 mg/dL (ref 0.44–1.00)
GFR, Est AFR Am: >60 mL/min (ref >60)
GFR, Estimated: >60 mL/min (ref >60)
Glucose, Bld: 115 mg/dL — ABNORMAL HIGH (ref 70–99)
Potassium: 3.9 mmol/L (ref 3.5–5.1)
Sodium: 144 mmol/L (ref 135–145)
Total Bilirubin: 0.5 mg/dL (ref 0.3–1.2)
Total Protein: 7.2 g/dL (ref 6.5–8.1)

## 2020-05-17 LAB — IRON AND TIBC
Iron: 63 ug/dL (ref 41–142)
Saturation Ratios: 29 % (ref 21–57)
TIBC: 213 ug/dL — ABNORMAL LOW (ref 236–444)
UIBC: 150 ug/dL (ref 120–384)

## 2020-05-17 LAB — FERRITIN: Ferritin: 565 ng/mL — ABNORMAL HIGH (ref 11–307)

## 2020-05-17 NOTE — Progress Notes (Signed)
Hematology and Oncology Follow Up Visit  Carigan Lister [id number] 1951-07-11 69 y.o. 05/17/2020   Principle Diagnosis:  Locally recurrent adenocarcinoma of the right breast History of superficial venous thrombus of the right thigh  Current Therapy:        1. Aromasin 25 mg p.o. daily 2. Aspirin 81 mg p.o. daily   Interim History:  Ms. Maroney is here today for follow-up.  Her problem right now is that she has a stye on the upper eyelid of the left eye.  She started some antibiotics for this yesterday.  Patient has some hearing difficulties earlier this year.  This all seems to have gotten better.  She and her family did go to the beach.  She had a good time.  She was very conscientious about sunscreen.  She has had no problems with the Aromasin.  She has had no arthralgias.  She has had no diarrhea.  There has been no change in bowel or bladder habits.  There is been no issues with rashes.  She has had no headache.  She has gotten iron in the past.  Her iron studies back in April showed a ferritin of 457 with iron saturation of 19%.  Currently, her performance status is ECOG 1.     Medications:  Allergies as of 05/17/2020      Reactions   Contrast Media [iodinated Diagnostic Agents] Hives, Shortness Of Breath, Nausea And Vomiting   Allergic reaction even after pre-meds.   Metrizamide Hives, Shortness Of Breath, Nausea And Vomiting   Allergic reaction even after pre-meds.   Other Shortness Of Breath, Nausea And Vomiting, Rash   Allergic reaction even after pre-meds.   Brilliant Blue Fcf Hives, Nausea Only   Dye Fdc Blue [brilliant Blue Fcf (fd&c Blue #1)] Hives, Nausea Only      Medication List       Accurate as of May 17, 2020 10:48 AM. If you have any questions, ask your nurse or doctor.        amLODipine 10 MG tablet Commonly known as: NORVASC Take 10 mg by mouth daily.   aspirin EC 81 MG tablet Take 81 mg by mouth daily.   candesartan 32 MG tablet Commonly known  as: ATACAND Take 32 mg by mouth daily.   exemestane 25 MG tablet Commonly known as: AROMASIN TAKE 1 TABLET BY MOUTH EVERY DAY   hydrochlorothiazide 25 MG tablet Commonly known as: HYDRODIURIL Take 25 mg by mouth daily.   ibuprofen 200 MG tablet Commonly known as: ADVIL Take 200 mg by mouth every 6 (six) hours as needed.   metoprolol succinate 50 MG 24 hr tablet Commonly known as: TOPROL-XL Take 50 mg by mouth daily.   silver sulfADIAZINE 1 % cream Commonly known as: Silvadene Apply 1 application topically daily.   simvastatin 20 MG tablet Commonly known as: ZOCOR Take 20 mg by mouth daily.   Vitamin D (Ergocalciferol) 1.25 MG (50000 UNIT) Caps capsule Commonly known as: DRISDOL TAKE 1 CAPSULE BY MOUTH ONE TIME PER WEEK       Allergies:  Allergies  Allergen Reactions  . Contrast Media [Iodinated Diagnostic Agents] Hives, Shortness Of Breath and Nausea And Vomiting    Allergic reaction even after pre-meds.  . Metrizamide Hives, Shortness Of Breath and Nausea And Vomiting    Allergic reaction even after pre-meds.  . Other Shortness Of Breath, Nausea And Vomiting and Rash    Allergic reaction even after pre-meds.  Dillard [place] Blue Fcf Hives and Nausea  Only  . Dye Fdc Blue [Brilliant Blue Fcf (Fd&C Blue #1)] Hives and Nausea Only    Past Medical History, Surgical history, Social history, and Family History were reviewed and updated.  Review of Systems: Review of Systems  Constitutional: Negative.   HENT: Positive for hearing loss.   Eyes: Positive for discharge.  Respiratory: Negative.   Cardiovascular: Negative.   Gastrointestinal: Negative.   Genitourinary: Negative.   Musculoskeletal: Negative.   Skin: Negative.   Neurological: Negative.   Endo/Heme/Allergies: Negative.   Psychiatric/Behavioral: Negative.      Physical Exam:  vitals were not taken for this visit.   Wt Readings from Last 3 Encounters:  01/16/20 207 lb (93.9 kg)  10/18/19 212 lb 8 oz  (96.4 kg)  07/12/19 208 lb 1.9 oz (94.4 kg)   Physical Exam Vitals reviewed.  Constitutional:      Comments: On her breast exam, her left breast is without any masses, edema or erythema.  There is no left axillary adenopathy.  There is no discharge from the nipple on the left breast.  Right chest wall shows radiation dermatitis which is about the same.  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  There is no obvious exudate.  There is no tenderness.  There is no right axillary adenopathy.  HENT:     Head: Normocephalic and atraumatic.  Eyes:     Pupils: Pupils are equal, round, and reactive to light.     Comments: Her ocular exam does show this stye on the upper eyelid of the left eye.  There is an area of purulent material that looks like he might be coming out soon.  She has good extraocular muscle movement.  Pupils react appropriately.  Cardiovascular:     Rate and Rhythm: Normal rate and regular rhythm.     Heart sounds: Normal heart sounds.  Pulmonary:     Effort: Pulmonary effort is normal.     Breath sounds: Normal breath sounds.  Abdominal:     General: Bowel sounds are normal.     Palpations: Abdomen is soft.  Musculoskeletal:        General: No tenderness or deformity. Normal range of motion.     Cervical back: Normal range of motion.  Lymphadenopathy:     Cervical: No cervical adenopathy.  Skin:    General: Skin is warm and dry.     Findings: No erythema or rash.  Neurological:     Mental Status: She is alert and oriented to person, place, and time.  Psychiatric:        Behavior: Behavior normal.        Thought Content: Thought content normal.        Judgment: Judgment normal.       Lab Results  Component Value Date   WBC 6.4 05/17/2020   HGB 11.3 (L) 05/17/2020   HCT 35.6 (L) 05/17/2020   MCV 93.9 05/17/2020   PLT 179 05/17/2020   Lab Results  Component Value Date   FERRITIN 457 (H) 01/16/2020   IRON 46 01/16/2020   TIBC 239 01/16/2020     UIBC 193 01/16/2020   IRONPCTSAT 19 (L) 01/16/2020   Lab Results  Component Value Date   RETICCTPCT 1.4 06/20/2011   RBC 3.79 (L) 05/17/2020   RETICCTABS 55.2 06/20/2011   No results found for: KPAFRELGTCHN, LAMBDASER, KAPLAMBRATIO No results found for: Kandis Cocking, IGMSERUM Lab Results  Component Value Date   TOTALPROTELP 6.6 08/14/2010   ALBUMINELP  53.8 (L) 08/14/2010   A1GS 5.3 (H) 08/14/2010   A2GS 14.3 (H) 08/14/2010   BETS 5.8 08/14/2010   BETA2SER 6.3 08/14/2010   GAMS 14.5 08/14/2010   MSPIKE NOT DET 08/14/2010   SPEI * 08/14/2010     Chemistry      Component Value Date/Time   NA 144 05/17/2020 0951   NA 146 (H) 07/23/2017 0900   NA 144 02/18/2017 0901   K 3.9 05/17/2020 0951   K 3.7 07/23/2017 0900   K 3.7 02/18/2017 0901   CL 107 05/17/2020 0951   CL 107 07/23/2017 0900   CO2 31 05/17/2020 0951   CO2 31 07/23/2017 0900   CO2 27 02/18/2017 0901   BUN 23 05/17/2020 0951   BUN 16 07/23/2017 0900   BUN 17.7 02/18/2017 0901   CREATININE 0.78 05/17/2020 0951   CREATININE 0.8 07/23/2017 0900   CREATININE 0.7 02/18/2017 0901      Component Value Date/Time   CALCIUM 9.8 05/17/2020 0951   CALCIUM 9.3 07/23/2017 0900   CALCIUM 9.3 02/18/2017 0901   ALKPHOS 58 05/17/2020 0951   ALKPHOS 67 07/23/2017 0900   ALKPHOS 78 02/18/2017 0901   AST 13 (L) 05/17/2020 0951   AST 13 02/18/2017 0901   ALT 12 05/17/2020 0951   ALT 20 07/23/2017 0900   ALT 12 02/18/2017 0901   BILITOT 0.5 05/17/2020 0951   BILITOT 0.54 02/18/2017 0901       Impression and Plan: Ms. Tesmer is a very pleasant 69 yo caucasian female with history of locally recurrent adenocarcinoma of the right breast with resection and radiation.    No BPR now is this stye.  She is instructed to place warm compresses on her left eye.  She is on antibiotics.  Hopefully, this will open up on its own and then drain.  Hopefully she will not need surgery.  We will see what her iron studies show.  I would  like to hope that everything will be stable.  We will plan to get her back now in 4 months.    [name], MD 8/5/202110:48 AM

## 2020-05-29 DIAGNOSIS — E669 Obesity, unspecified: Secondary | ICD-10-CM | POA: Diagnosis not present

## 2020-05-29 DIAGNOSIS — Z6831 Body mass index (BMI) 31.0-31.9, adult: Secondary | ICD-10-CM | POA: Diagnosis not present

## 2020-05-30 DIAGNOSIS — H0014 Chalazion left upper eyelid: Secondary | ICD-10-CM | POA: Diagnosis not present

## 2020-06-04 ENCOUNTER — Other Ambulatory Visit: Payer: Self-pay | Admitting: *Deleted

## 2020-06-04 DIAGNOSIS — C50011 Malignant neoplasm of nipple and areola, right female breast: Secondary | ICD-10-CM

## 2020-06-04 MED ORDER — EXEMESTANE 25 MG PO TABS: 25.0000 mg | ORAL_TABLET | Freq: Every day | ORAL | 6 refills | Status: DC

## 2020-06-12 DIAGNOSIS — I10 Essential (primary) hypertension: Secondary | ICD-10-CM | POA: Diagnosis not present

## 2020-06-12 DIAGNOSIS — E782 Mixed hyperlipidemia: Secondary | ICD-10-CM | POA: Diagnosis not present

## 2020-06-12 DIAGNOSIS — I700 Atherosclerosis of aorta: Secondary | ICD-10-CM | POA: Diagnosis not present

## 2020-06-12 DIAGNOSIS — Z6831 Body mass index (BMI) 31.0-31.9, adult: Secondary | ICD-10-CM | POA: Diagnosis not present

## 2020-06-12 DIAGNOSIS — E669 Obesity, unspecified: Secondary | ICD-10-CM | POA: Diagnosis not present

## 2020-06-26 DIAGNOSIS — Z6831 Body mass index (BMI) 31.0-31.9, adult: Secondary | ICD-10-CM | POA: Diagnosis not present

## 2020-06-26 DIAGNOSIS — E669 Obesity, unspecified: Secondary | ICD-10-CM | POA: Diagnosis not present

## 2020-07-02 DIAGNOSIS — Z01419 Encounter for gynecological examination (general) (routine) without abnormal findings: Secondary | ICD-10-CM | POA: Diagnosis not present

## 2020-07-02 DIAGNOSIS — Z6831 Body mass index (BMI) 31.0-31.9, adult: Secondary | ICD-10-CM | POA: Diagnosis not present

## 2020-07-10 DIAGNOSIS — E669 Obesity, unspecified: Secondary | ICD-10-CM | POA: Diagnosis not present

## 2020-07-10 DIAGNOSIS — Z6831 Body mass index (BMI) 31.0-31.9, adult: Secondary | ICD-10-CM | POA: Diagnosis not present

## 2020-07-13 DIAGNOSIS — I10 Essential (primary) hypertension: Secondary | ICD-10-CM | POA: Diagnosis not present

## 2020-07-13 DIAGNOSIS — E782 Mixed hyperlipidemia: Secondary | ICD-10-CM | POA: Diagnosis not present

## 2020-07-13 DIAGNOSIS — I700 Atherosclerosis of aorta: Secondary | ICD-10-CM | POA: Diagnosis not present

## 2020-08-06 ENCOUNTER — Encounter (HOSPITAL_BASED_OUTPATIENT_CLINIC_OR_DEPARTMENT_OTHER): Payer: Medicare Other | Admitting: Internal Medicine

## 2020-08-13 DIAGNOSIS — I700 Atherosclerosis of aorta: Secondary | ICD-10-CM | POA: Diagnosis not present

## 2020-08-13 DIAGNOSIS — Z6831 Body mass index (BMI) 31.0-31.9, adult: Secondary | ICD-10-CM | POA: Diagnosis not present

## 2020-08-13 DIAGNOSIS — I10 Essential (primary) hypertension: Secondary | ICD-10-CM | POA: Diagnosis not present

## 2020-08-13 DIAGNOSIS — E782 Mixed hyperlipidemia: Secondary | ICD-10-CM | POA: Diagnosis not present

## 2020-08-22 ENCOUNTER — Encounter (HOSPITAL_BASED_OUTPATIENT_CLINIC_OR_DEPARTMENT_OTHER): Payer: Medicare Other | Attending: Internal Medicine | Admitting: Physician Assistant

## 2020-08-22 ENCOUNTER — Other Ambulatory Visit: Payer: Self-pay

## 2020-08-22 DIAGNOSIS — L98492 Non-pressure chronic ulcer of skin of other sites with fat layer exposed: Secondary | ICD-10-CM | POA: Insufficient documentation

## 2020-08-22 DIAGNOSIS — L598 Other specified disorders of the skin and subcutaneous tissue related to radiation: Secondary | ICD-10-CM | POA: Diagnosis not present

## 2020-08-22 DIAGNOSIS — Z86718 Personal history of other venous thrombosis and embolism: Secondary | ICD-10-CM | POA: Diagnosis not present

## 2020-08-22 DIAGNOSIS — Z9221 Personal history of antineoplastic chemotherapy: Secondary | ICD-10-CM | POA: Diagnosis not present

## 2020-08-22 DIAGNOSIS — I890 Lymphedema, not elsewhere classified: Secondary | ICD-10-CM | POA: Diagnosis not present

## 2020-08-22 DIAGNOSIS — Z9011 Acquired absence of right breast and nipple: Secondary | ICD-10-CM | POA: Diagnosis not present

## 2020-08-22 DIAGNOSIS — I10 Essential (primary) hypertension: Secondary | ICD-10-CM | POA: Insufficient documentation

## 2020-08-22 DIAGNOSIS — L589 Radiodermatitis, unspecified: Secondary | ICD-10-CM | POA: Insufficient documentation

## 2020-08-22 DIAGNOSIS — Z923 Personal history of irradiation: Secondary | ICD-10-CM | POA: Insufficient documentation

## 2020-08-22 NOTE — Progress Notes (Signed)
[name], [name] ([id number]) Visit Report for 08/22/2020 Abuse/Suicide Risk Screen Details Patient Name: Date of Service: [name] 08/22/2020 1:15 PM Medical Record Number: [id number] Patient Account Number: [id number] Date of Birth/Sex: Treating RN: Jul 28, 1951 (69 y.o. Female) [name] Primary Care [name]: HO DGES, Tamela Oddi Other Clinician: Referring [name]: Treating [name] HO DGES, FRA NCISCO Weeks in Treatment: 0 Abuse/Suicide Risk Screen Items Answer ABUSE RISK SCREEN: Has anyone close to you tried to hurt or harm you recentlyo No Do you feel uncomfortable with anyone in your familyo No Has anyone forced you do things that you didnt want to doo No Electronic Signature(s) Signed: 08/22/2020 5:59:08 PM By: [name] Entered By: [name] on 08/22/2020 13:24:42 -------------------------------------------------------------------------------- Activities of Daily Living Details Patient Name: Date of Service: [name] YE 08/22/2020 1:15 PM Medical Record Number: [id number] Patient Account Number: [id number] Date of Birth/Sex: Treating RN: 08/30/1951 (69 y.o. Female) [name] Primary Care [name]: HO DGES, Tamela Oddi Other Clinician: Referring [name]: Treating [name]/Extender: [name] HO DGES, FRA NCISCO Weeks in Treatment: 0 Activities of Daily Living Items Answer Activities of Daily Living (Please select one for each item) Drive Automobile Completely Able T Medications ake Completely Able Use T elephone Completely Able Care for Appearance Completely Able Use T oilet Completely Able Bath / Shower Completely Able Dress Self Completely Able Feed Self Completely Able Walk Completely Able Get In / Out Bed Completely Able Housework Completely Able Prepare Meals Completely [place] for Self Completely Able Electronic Signature(s) Signed: 08/22/2020 5:59:08 PM By: [name] Entered By: [name] on 08/22/2020 13:24:56 -------------------------------------------------------------------------------- Education Screening Details Patient Name: Date of Service: [name], [name] 08/22/2020 1:15 PM Medical Record Number: [id number] Patient Account Number: [id number] Date of Birth/Sex: Treating RN: 1951/05/26 (69 y.o. Female) [name] Primary Care Aycen [name]: HO DGES, Tamela Oddi Other Clinician: Referring [name]: Treating [name]/Extender: [name] HO DGES, FRA NCISCO Weeks in Treatment: 0 Primary Learner Assessed: Patient Learning Preferences/Education Level/Primary Language Learning Preference: Explanation, Demonstration, Printed Material Highest Education Level: High School Preferred Language: English Cognitive Barrier Language Barrier: No Translator Needed: No Memory Deficit: No Emotional Barrier: No Cultural/Religious Beliefs Affecting Medical Care: No Physical Barrier Impaired Vision: Yes Glasses, reading Impaired Hearing: No Decreased Hand dexterity: No Knowledge/Comprehension Knowledge Level: High Comprehension Level: High Ability to understand written instructions: High Ability to understand verbal instructions: High Motivation Anxiety Level: Calm Cooperation: Cooperative Education Importance: Acknowledges Need Interest in Health Problems: Asks Questions Perception: Coherent Willingness to Engage in Self-Management High Activities: Readiness to Engage in Self-Management High Activities: Electronic Signature(s) Signed: 08/22/2020 5:59:08 PM By: [name] Entered By: [name] on 08/22/2020 13:25:18 -------------------------------------------------------------------------------- Fall Risk Assessment Details Patient Name: Date of Service: [name], [name] 08/22/2020 1:15 PM Medical Record Number: [id number] Patient Account Number: [id number] Date of Birth/Sex: Treating RN: 09/10/1951 (69 y.o. Female) [name],  [place] Primary [facility] [name]: HO DGES, FRA NCISCO Other Clinician: Referring [name]: Treating [name]/Extender: [name] HO DGES, FRA NCISCO Weeks in Treatment: 0 Fall Risk Assessment Items Have you had 2 or more falls in the last 12 monthso 0 No Have you had any fall that resulted in injury in the last 12 monthso 0 No FALLS RISK SCREEN History of falling - immediate or within 3 months 0 No Secondary diagnosis (Do you have 2 or more medical diagnoseso) 0 No Ambulatory aid None/bed rest/wheelchair/nurse 0 No Crutches/cane/walker 0 No Furniture 0 No Intravenous therapy  Access/Saline/Heparin Lock 0 No Gait/Transferring Normal/ bed rest/ wheelchair 0 No Weak (short steps with or without shuffle, stooped but able to lift head while walking, may seek 0 No support from furniture) Impaired (short steps with shuffle, may have difficulty arising from chair, head down, impaired 0 No balance) Mental Status Oriented to own ability 0 No Electronic Signature(s) Signed: 08/22/2020 5:59:08 PM By: [name] Entered By: [name] on 08/22/2020 13:27:02 -------------------------------------------------------------------------------- Foot Assessment Details Patient Name: Date of Service: [name], [name] 08/22/2020 1:15 PM Medical Record Number: [id number] Patient Account Number: [id number] Date of Birth/Sex: Treating RN: 1951/08/14 (69 y.o. Female) [name], [name] Primary Care [name]: HO DGES, FRA NCISCO Other Clinician: Referring [name]: Treating [name]/Extender: [name] HO DGES, FRA NCISCO Weeks in Treatment: 0 Foot Assessment Items Site Locations + = Sensation present, - = Sensation absent, C = Callus, U = Ulcer R = Redness, W = Warmth, M = Maceration, PU = Pre-ulcerative lesion F = Fissure, S = Swelling, D = Dryness Assessment Right: Left: Other Deformity: No No Prior Foot Ulcer: No No Prior Amputation: No No Charcot Joint: No No Ambulatory Status:  Ambulatory Without Help Gait: Steady Notes no BLE wounds. Electronic Signature(s) Signed: 08/22/2020 5:59:08 PM By: [name] Entered By: [name] on 08/22/2020 13:27:20 -------------------------------------------------------------------------------- Nutrition Risk Screening Details Patient Name: Date of Service: [name], [name] 08/22/2020 1:15 PM Medical Record Number: [id number] Patient Account Number: [id number] Date of Birth/Sex: Treating RN: 06-Jun-1951 (69 y.o. Female) [name], [place] [facility] Carolynn Tuley: HO DGES, FRA [place]ISCO Other Clinician: Referring Dorsie Sethi: Treating Ahrianna Siglin/Extender: [name] HO DGES, FRA [place]IS[place] Weeks in Treatment: 0 Height (in): 63 Weight (lbs): 176 Body Mass Index (BMI): 31.2 Nutrition Risk Screening Items Score Screening NUTRITION RISK SCREEN: I have an illness or condition that made me change the kind and/or amount of food I eat 2 Yes I eat fewer than two meals per day 0 No I eat few fruits and vegetables, or milk products 0 No I have three or more drinks of beer, liquor or wine almost every day 0 No I have tooth or mouth problems that make it hard for me to eat 0 No I don't always have enough money to buy the food I need 0 No I eat alone most of the time 0 No I take three or more different prescribed or over-the-counter drugs a day 1 Yes Without wanting to, I have lost or gained 10 pounds in the last six months 0 No I am not always physically able to shop, cook and/or feed myself 0 No Nutrition Protocols Good Risk Protocol Moderate Risk Protocol 0 Provide education on nutrition High Risk Proctocol Risk Level: Moderate Risk Score: 3 Electronic Signature(s) Signed: 08/22/2020 5:59:08 PM By: [name] Entered By: [name] on 08/22/2020 13:27:09

## 2020-08-22 NOTE — Progress Notes (Signed)
[name], [name] ([id number]) Visit Report for 08/22/2020 Allergy List Details Patient Name: Date of Service: [name] 08/22/2020 1:15 PM Medical Record Number: [id number] Patient Account Number: [id number] Date of Birth/Sex: Treating RN: January 15, 1951 (69 y.o. Female) [name] Primary Care Ellajane Stong: HO DGES, Tamela Oddi Other Clinician: Referring Jazzmin Newbold: Treating Reese Stockman/Extender: Worthy Keeler HO DGES, FRA NCISCO Weeks in Treatment: 0 Allergies Active Allergies Iodinated Contrast Media Reaction: anaphylaxis Allergy Notes Electronic Signature(s) Signed: 08/22/2020 5:59:08 PM By: [name] Entered By: [name] on 08/22/2020 13:23:45 -------------------------------------------------------------------------------- Arrival Information Details Patient Name: Date of Service: [name], [name] 08/22/2020 1:15 PM Medical Record Number: [id number] Patient Account Number: [id number] Date of Birth/Sex: Treating RN: [birth year] (69 y.o. Female) [name] Primary Care Keosha Rossa: HO DGES, Tamela Oddi Other Clinician: Referring Azael Ragain: Treating Irineo Gaulin/Extender: Worthy Keeler HO DGES, FRA NCISCO Weeks in Treatment: 0 Visit Information Patient Arrived: Ambulatory Arrival Time: 13:13 Accompanied By: self Transfer Assistance: None Patient Identification Verified: Yes Secondary Verification Process Completed: Yes Patient Requires Transmission-Based Precautions: No Patient Has Alerts: No Electronic Signature(s) Signed: 08/22/2020 5:59:08 PM By: [name] Entered By: [name] on 08/22/2020 13:16:58 -------------------------------------------------------------------------------- Clinic Level of Care Assessment Details Patient Name: Date of Service: [name] 08/22/2020 1:15 PM Medical Record Number: [id number] Patient Account Number: [id number] Date of Birth/Sex: Treating RN: Apr 01, 1951 (69 y.o. Female) Baruch Gouty Primary Care Darol Cush: HO DGES, Tamela Oddi  Other Clinician: Referring Rohit Deloria: Treating Antero Derosia/Extender: Worthy Keeler HO DGES, FRA NCISCO Weeks in Treatment: 0 Clinic Level of Care Assessment Items TOOL 2 Quantity Score []  - 0 Use when only an EandM is performed on the INITIAL visit ASSESSMENTS - Nursing Assessment / Reassessment X- 1 20 General Physical Exam (combine w/ comprehensive assessment (listed just below) when performed on [name] pt. evals) X- 1 25 Comprehensive Assessment (HX, ROS, Risk Assessments, Wounds Hx, etc.) ASSESSMENTS - Wound and Skin A ssessment / Reassessment X - Simple Wound Assessment / Reassessment - one wound 1 5 []  - 0 Complex Wound Assessment / Reassessment - multiple wounds []  - 0 Dermatologic / Skin Assessment (not related to wound area) ASSESSMENTS - Ostomy and/or Continence Assessment and Care []  - 0 Incontinence Assessment and Management []  - 0 Ostomy Care Assessment and Management (repouching, etc.) PROCESS - Coordination of Care X - Simple Patient / Family Education for ongoing care 1 15 []  - 0 Complex (extensive) Patient / Family Education for ongoing care X- 1 10 Staff obtains Programmer, systems, Records, T Results / Process Orders est []  - 0 Staff telephones HHA, Nursing Homes / Clarify orders / etc []  - 0 Routine Transfer to another Facility (non-emergent condition) []  - 0 Routine Hospital Admission (non-emergent condition) X- 1 15 [name] Admissions / Biomedical engineer / Ordering NPWT Apligraf, etc. , []  - 0 Emergency Hospital Admission (emergent condition) X- 1 10 Simple Discharge Coordination []  - 0 Complex (extensive) Discharge Coordination PROCESS - Special Needs []  - 0 Pediatric / Minor Patient Management []  - 0 Isolation Patient Management []  - 0 Hearing / Language / Visual special needs []  - 0 Assessment of Community assistance (transportation, D/C planning, etc.) []  - 0 Additional assistance / Altered mentation []  - 0 Support Surface(s) Assessment (bed,  cushion, seat, etc.) INTERVENTIONS - Wound Cleansing / Measurement X- 1 5 Wound Imaging (photographs - any number of wounds) []  - 0 Wound Tracing (instead of photographs) X- 1 5 Simple Wound Measurement - one wound []  - 0 Complex Wound Measurement - multiple wounds  X- 1 5 Simple Wound Cleansing - one wound []  - 0 Complex Wound Cleansing - multiple wounds INTERVENTIONS - Wound Dressings X - Small Wound Dressing one or multiple wounds 1 10 []  - 0 Medium Wound Dressing one or multiple wounds []  - 0 Large Wound Dressing one or multiple wounds []  - 0 Application of Medications - injection INTERVENTIONS - Miscellaneous []  - 0 External ear exam []  - 0 Specimen Collection (cultures, biopsies, blood, body fluids, etc.) []  - 0 Specimen(s) / Culture(s) sent or taken to Lab for analysis []  - 0 Patient Transfer (multiple staff / Harrel Lemon Lift / Similar devices) []  - 0 Simple Staple / Suture removal (25 or less) []  - 0 Complex Staple / Suture removal (26 or more) []  - 0 Hypo / Hyperglycemic Management (close monitor of Blood Glucose) []  - 0 Ankle / Brachial Index (ABI) - do not check if billed separately Has the patient been seen at the hospital within the last three years: Yes Total Score: 125 Level Of Care: [name]/Established - Level 4 Electronic Signature(s) Signed: 08/22/2020 5:57:36 PM By: Baruch Gouty RN, BSN Entered By: Baruch Gouty on 08/22/2020 14:24:03 -------------------------------------------------------------------------------- Encounter Discharge Information Details Patient Name: Date of Service: [name], [name] 08/22/2020 1:15 PM Medical Record Number: [id number] Patient Account Number: [id number] Date of Birth/Sex: Treating RN: 03-02-1951 (69 y.o. Female) [place], [place] Primary Care Makenlee Mckeag: HO DGES, FRA NCISCO Other Clinician: Referring Gerrard Crystal: Treating Catheleen Langhorne/Extender: Worthy Keeler HO DGES, FRA NCISCO Weeks in Treatment: 0 Encounter Discharge  Information Items Discharge Condition: Stable Ambulatory Status: Ambulatory Discharge Destination: Home Transportation: Private Auto Accompanied By: self Schedule Follow-up Appointment: Yes Clinical Summary of Care: Patient Declined Electronic Signature(s) Signed: 08/22/2020 5:51:14 PM By: Carlene Coria RN Entered By: Carlene Coria on 08/22/2020 14:54:54 -------------------------------------------------------------------------------- Lower Extremity Assessment Details Patient Name: Date of Service: [name], [name] 08/22/2020 1:15 PM Medical Record Number: [id number] Patient Account Number: [id number] Date of Birth/Sex: Treating RN: 09/02/1951 (69 y.o. Female) [name] Primary Care Nyla Creason: HO DGES, Tamela Oddi Other Clinician: Referring Cavion Faiola: Treating Rilla Buckman/Extender: Worthy Keeler HO DGES, FRA NCISCO Weeks in Treatment: 0 Notes No BLE wounds. Electronic Signature(s) Signed: 08/22/2020 5:59:08 PM By: [name] Entered By: [name] on 08/22/2020 13:27:29 -------------------------------------------------------------------------------- Multi-Disciplinary Care Plan Details Patient Name: Date of Service: [name], [name] 08/22/2020 1:15 PM Medical Record Number: [id number] Patient Account Number: [id number] Date of Birth/Sex: Treating RN: [name] (69 y.o. Female) Baruch Gouty Primary Care Refujio Haymer: HO DGES, Tamela Oddi Other Clinician: Referring Tomeca Helm: Treating Therese Rocco/Extender: Worthy Keeler HO DGES, FRA NCISCO Weeks in Treatment: 0 Active Inactive Orientation to the Wound Care Program Nursing Diagnoses: Knowledge deficit related to the wound healing center program Goals: Patient/caregiver will verbalize understanding of the [place] Date Initiated: 08/22/2020 Target Resolution Date: 09/19/2020 Goal Status: Active Interventions: Provide education on orientation to the wound center Notes: Wound/Skin Impairment Nursing  Diagnoses: Impaired tissue integrity Knowledge deficit related to ulceration/compromised skin integrity Goals: Patient/caregiver will verbalize understanding of skin care regimen Date Initiated: 08/22/2020 Target Resolution Date: 09/19/2020 Goal Status: Active Ulcer/skin breakdown will have a volume reduction of 30% by week 4 Date Initiated: 08/22/2020 Target Resolution Date: 09/19/2020 Goal Status: Active Interventions: Assess patient/caregiver ability to obtain necessary supplies Assess patient/caregiver ability to perform ulcer/skin care regimen upon admission and as needed Assess ulceration(s) every visit Treatment Activities: Skin care regimen initiated : 08/22/2020 Topical wound management initiated : 08/22/2020 Notes: Electronic Signature(s) Signed: 08/22/2020 5:57:36 PM By: Baruch Gouty  RN, BSN Entered By: Baruch Gouty on 08/22/2020 14:21:38 -------------------------------------------------------------------------------- Pain Assessment Details Patient Name: Date of Service: [name] 08/22/2020 1:15 PM Medical Record Number: [id number] Patient Account Number: [id number] Date of Birth/Sex: Treating RN: [birth year] (69 y.o. Female) [name] Primary Care Shawon Denzer: HO DGES, Tamela Oddi Other Clinician: Referring Torsten Weniger: Treating Tharon Bomar/Extender: Worthy Keeler HO DGES, FRA [employer]ISCO Weeks in Treatment: 0 Active Problems Location of Pain Severity and Description of Pain Patient Has Paino Yes Site Locations Pain Location: Generalized Pain Rate the pain. Current Pain Level: 2 Worst Pain Level: 10 Least Pain Level: 0 Tolerable Pain Level: 8 Pain Management and Medication Current Pain Management: Medication: No Cold Application: No Rest: No Massage: No Activity: No T.E.N.S.: No Heat Application: No Leg drop or elevation: No Is the Current Pain Management Adequate: Adequate How does your wound impact your activities of daily livingo Sleep:  No Bathing: No Appetite: No Relationship With Others: No Bladder Continence: No Emotions: No Bowel Continence: No Work: No Toileting: No Drive: No Dressing: No Hobbies: No Electronic Signature(s) Signed: 08/22/2020 5:59:08 PM By: [name] Entered By: [name] on 08/22/2020 13:28:07 -------------------------------------------------------------------------------- Patient/Caregiver Education Details Patient Name: Date of Service: [name], [name] 11/10/2021andnbsp1:15 PM Medical Record Number: [id number] Patient Account Number: [id number] Date of Birth/Gender: Treating RN: [name] (69 y.o. Female) Baruch Gouty Primary Care Physician: HO DGES, Tamela Oddi Other Clinician: Referring Physician: Treating Physician/Extender: Worthy Keeler HO DGES, FRA NCISCO Weeks in Treatment: 0 Education Assessment Education Provided To: Patient Education Topics Provided Welcome T [facility]: o Handouts: Welcome T [facility] [place] o Methods: Explain/Verbal, Printed Responses: Reinforcements needed, State content correctly Wound/Skin Impairment: Handouts: Caring for Your Ulcer, Skin Care Do's and Dont's Methods: Explain/Verbal, Printed Responses: Reinforcements needed, State content correctly Electronic Signature(s) Signed: 08/22/2020 5:57:36 PM By: Baruch Gouty RN, BSN Entered By: Baruch Gouty on 08/22/2020 14:22:16 -------------------------------------------------------------------------------- Wound Assessment Details Patient Name: Date of Service: [name], [name] 08/22/2020 1:15 PM Medical Record Number: [id number] Patient Account Number: [id number] Date of Birth/Sex: Treating RN: [birth year] (69 y.o. Female) [name], [name] Primary Care Atha Mcbain: HO DGES, FRA NCISCO Other Clinician: Referring Shihab States: Treating Ladora Osterberg/Extender: Worthy Keeler HO DGES, FRA NCISCO Weeks in Treatment: 0 Wound Status Wound Number: 1 Primary Soft Tissue  Radionecrosis Etiology: Wound Location: Right Breast (mastectomy site) Wound Open Wounding Event: Radiation Burn Status: Date Acquired: 07/26/2020 Comorbid Anemia, Lymphedema, Deep Vein Thrombosis, Hypertension, Weeks Of Treatment: 0 History: Peripheral Venous Disease, Osteoarthritis, Received Clustered Wound: No Chemotherapy, Received Radiation, Confinement Anxiety Wound Measurements Length: (cm) 1.3 Width: (cm) 1.5 Depth: (cm) 0.1 Area: (cm) 1.532 Volume: (cm) 0.153 % Reduction in Area: 0% % Reduction in Volume: 0% Epithelialization: None Tunneling: No Undermining: No Wound Description Classification: Full Thickness Without Exposed Support Structu Wound Margin: Distinct, outline attached Exudate Amount: Medium Exudate Type: Serosanguineous Exudate Color: red, brown res Foul Odor After Cleansing: No Slough/Fibrino Yes Wound Bed Granulation Amount: Small (1-33%) Exposed Structure Granulation Quality: Pink Fascia Exposed: No Necrotic Amount: Large (67-100%) Fat Layer (Subcutaneous Tissue) Exposed: Yes Necrotic Quality: Adherent Slough Tendon Exposed: No Muscle Exposed: No Joint Exposed: No Bone Exposed: No Treatment Notes Wound #1 (Right Breast (mastectomy site)) 1. Cleanse With Wound Cleanser 3. Primary Dressing Applied Collegen [employer] 4. Secondary Dressing ABD Pad Electronic Signature(s) Signed: 08/22/2020 5:59:08 PM By: [name] Entered By: [name] on 08/22/2020 14:14:22 -------------------------------------------------------------------------------- Vitals Details Patient Name: Date of Service: [name], [name] 08/22/2020 1:15 PM Medical Record  Number: [id number] Patient Account Number: [id number] Date of Birth/Sex: Treating RN: 1951/01/09 (69 y.o. Female) [name], [name] Primary Care Deshonda Cryderman: HO DGES, FRA NCISCO Other Clinician: Referring Chynna Buerkle: Treating Shiann Kam/Extender: Worthy Keeler HO DGES, FRA NCISCO Weeks in Treatment: 0 Vital  Signs Time Taken: 13:13 Temperature (F): 89.4 Height (in): 63 Pulse (bpm): 86 Source: Stated Respiratory Rate (breaths/min): 20 Weight (lbs): 176 Blood Pressure (mmHg): 138/64 Source: Stated Reference Range: 80 - 120 mg / dl Body Mass Index (BMI): 31.2 Electronic Signature(s) Signed: 08/22/2020 5:59:08 PM By: [name] Entered By: [name] on 08/22/2020 13:24:32

## 2020-08-22 NOTE — Progress Notes (Signed)
MADISAN, BICE ([id number]) Visit Report for 08/22/2020 Chief Complaint Document Details Patient Name: Date of Service: [name] [name] 08/22/2020 1:15 PM Medical Record Number: [id number] Patient Account Number: [id number] Date of Birth/Sex: Treating RN: [birth year] (69 y.o. 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 Female) Baruch Gouty Primary Care Provider: HO DGES, Tamela Oddi Other Clinician: Referring Provider: Treating Provider/Extender: Worthy Keeler HO DGES, FRA NCISCO Weeks in Treatment: 0 Debridement Performed for Assessment: Wound #1 Right Breast (mastectomy site) Performed By: Physician Worthy Keeler, PA Debridement Type: Chemical/Enzymatic/Mechanical Agent Used: anasept and gauze Level of Consciousness (Pre-procedure): Awake and Alert Pre-procedure Verification/Time Out Yes - 13:30 Taken: Bleeding: None Response to Treatment: Procedure was tolerated well Level of Consciousness (Post- Awake and Alert procedure): Post Debridement Measurements of Total Wound Length: (cm) 1.3 Width: (cm) 1.5 Depth: (cm) 0.1 Volume: (cm) 0.153 Character of Wound/Ulcer Post Debridement: Improved Post Procedure Diagnosis Same as Pre-procedure Electronic Signature(s) Signed: 08/22/2020 5:47:40 PM By: Worthy Keeler  PA-C Signed: 08/22/2020 5:57:36 PM By: Baruch Gouty RN, BSN Entered By: Baruch Gouty on 08/22/2020 16:56:48 -------------------------------------------------------------------------------- HPI Details Patient Name: Date of Service: [name], [name] 08/22/2020 1:15 PM Medical Record Number: [id number] Patient Account Number: [id number] Date of Birth/Sex: Treating RN: [birth year] (69 y.o. Female) Baruch Gouty Primary Care Provider: HO DGES, Tamela Oddi Other Clinician: Referring Provider: Treating Provider/Extender: Worthy Keeler HO DGES, FRA NCISCO Weeks in Treatment: 0 History of Present Illness HPI Description: 08/22/2020 upon evaluation today patient actually appears to be doing somewhat poorly in regard to her mastectomy site on the right chest wall. She had a mastectomy initially in 1998. She had chemotherapy only no radiation following. In 2002 she subsequently did undergo radiation for the first time and then subsequently in 2012 had repeat radiation after having had a finding that was consistent with a relapse of the cancer. 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 She also has radiation damage to the skin over the right chest wall where she had the mastectomy. She was referred to [place] by Dr. Matthew Saras in [facility] and her oncologist is Dr. Burney Gauze. Subsequently I want to make sure before we delve into this more deeply that the patient does not have any issues with a return of cancer that needs to be managed by them. 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 Nonetheless she does not remember exactly when her last PET scan was but it was not too recently she tells me. 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 The patient does have hypertension as well. She  sees her oncologist next on December 6. Electronic Signature(s) Signed: 08/22/2020 5:44:10 PM By: Worthy Keeler PA-C Entered By: Worthy Keeler on 08/22/2020 17:44:10 -------------------------------------------------------------------------------- Physical Exam Details Patient Name: Date of Service: [name], [name] 08/22/2020 1:15 PM Medical Record Number: [id number] Patient Account Number: [id number] Date of Birth/Sex: Treating RN: 05-25-1951 (69 y.o. Female) Baruch Gouty Primary Care Provider: HO DGES, Tamela Oddi Other Clinician: Referring Provider: Treating Provider/Extender: Worthy Keeler HO DGES, FRA NCISCO Weeks in Treatment: 0 Constitutional sitting or standing blood pressure is within target range for patient.. pulse regular and within target range for patient.Marland Kitchen respirations regular, non-labored and within target range for patient.Marland Kitchen temperature within target range for patient.. [name] and well-hydrated in no acute distress. Eyes conjunctiva clear no eyelid edema noted. pupils equal round and reactive to light and accommodation. Ears, Nose, Mouth, and Throat no gross abnormality of ear auricles or external auditory canals. normal hearing noted during conversation. mucus membranes moist. Respiratory normal breathing without difficulty. Cardiovascular 2+ dorsalis pedis/posterior tibialis pulses. no clubbing, cyanosis, significant edema, <3 sec cap refill. Musculoskeletal normal gait and posture. no significant deformity or arthritic changes, no loss or range of motion, no clubbing. Psychiatric this patient is able to make decisions and demonstrates good insight into disease process. Alert and Oriented x 3. pleasant and cooperative. Notes Upon inspection patient's wound bed actually showed signs of significant scar tissue secondary to the soft tissue radionecrosis following chemotherapy and specifically radiation therapy in 2002 initially and then repeated in 2012 for  recurrence. Subsequently the patient tells me that she really does not have any significant pain but that off and on she has had issues with this wound for quite some time. Obviously I do think that the scar tissue and the radiation damage is a significant cause of issue here but I do want to make sure that from a cancer standpoint there is nothing reoccurring she does see her oncologist on December 6 we will see what he has to say at that point. Electronic Signature(s) Signed: 08/22/2020 5:44:56 PM By: Worthy Keeler PA-C Entered By: Worthy Keeler on 08/22/2020 17:44:55 -------------------------------------------------------------------------------- Physician Orders Details Patient Name: Date of Service: [name], [name] 08/22/2020 1:15 PM Medical Record Number: [id number] Patient Account Number: [id number] Date of Birth/Sex: Treating RN: 08/15/1951 (69 y.o. Female) Baruch Gouty Primary Care Provider: HO DGES, FRA NCISCO Other Clinician: Referring Provider: Treating Provider/Extender: Worthy Keeler HO DGES, FRA NCISCO Weeks in Treatment: 0 Verbal / Phone Orders: No Diagnosis Coding ICD-10 Coding Code Description L98.492 Non-pressure chronic ulcer of skin of other sites with fat layer exposed L58.9 Radiodermatitis, unspecified L59.8 Other specified disorders of the skin and subcutaneous tissue related to radiation I10 Essential (primary) hypertension Follow-up Appointments Return appointment in 3 weeks. Dressing Change Frequency Wound #1 Right Breast (mastectomy site) Change Dressing every other day. Skin Barriers/Peri-Wound Care Moisturizing lotion - to scar tissue daily Wound Cleansing May shower and wash wound with soap and water. Primary Wound Dressing Wound #1 Right Breast (mastectomy site) Silver Collagen - moisten with saline Secondary Dressing Wound #1 Right Breast (mastectomy site) ABD pad - secure with tape, no tape on scarred tissue or may use silicone foam  border Electronic Signature(s) Signed: 08/22/2020 5:47:40 PM By: Worthy Keeler PA-C Signed: 08/22/2020 5:57:36 PM By: Baruch Gouty RN, BSN Entered By: Baruch Gouty on 08/22/2020 14:37:09 -------------------------------------------------------------------------------- Problem List Details Patient Name: Date of Service: [name], [name] 08/22/2020 1:15 PM Medical Record Number: [id number] Patient Account Number: [id number] Date of Birth/Sex: Treating RN: 02-18-1951 (69 y.o. Female) Baruch Gouty Primary Care Provider: Other Clinician: HO DGES, Tamela Oddi Referring Provider: Treating Provider/Extender: Worthy Keeler HO DGES, FRA NCISCO Weeks in Treatment:  0 Active Problems ICD-10 Encounter Code Description Active Date MDM Diagnosis L98.492 Non-pressure chronic ulcer of skin of other sites with fat layer exposed 08/22/2020 No Yes L58.9 Radiodermatitis, unspecified 08/22/2020 No Yes L59.8 Other specified disorders of the skin and subcutaneous tissue related to 08/22/2020 No Yes radiation I10 Essential (primary) hypertension 08/22/2020 No Yes Inactive Problems Resolved Problems Electronic Signature(s) Signed: 08/22/2020 2:13:32 PM By: Worthy Keeler PA-C Entered By: Worthy Keeler on 08/22/2020 14:13:31 -------------------------------------------------------------------------------- Progress Note Details Patient Name: Date of Service: [name], [name] 08/22/2020 1:15 PM Medical Record Number: [id number] Patient Account Number: [id number] Date of Birth/Sex: Treating RN: 07/17/1951 (69 y.o. 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 She had a mastectomy initially in 1998. She had chemotherapy only no radiation following. In 2002 she subsequently did undergo radiation for the first time and then subsequently in 2012 had repeat radiation after having had a finding that was consistent with a relapse of the cancer. 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 She also has radiation damage to the skin over the right chest wall where she had the mastectomy. She was referred to [place] by Dr. Matthew Saras in [facility] and her oncologist is Dr. Burney Gauze. Subsequently I want to make sure before we delve into this more deeply that the patient does not have any issues with a return of cancer that needs to be managed by them. 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 Nonetheless she does not remember exactly when her last PET scan was but it was not too recently she tells me. 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 The patient does have hypertension as well. She sees her oncologist next on December 6. Patient History Information obtained from Patient. Allergies Iodinated Contrast Media (Reaction: anaphylaxis) Family History Cancer - Mother,Father,Paternal Grandparents, Diabetes - Mother,Siblings, Heart Disease - Siblings, Hypertension - Mother,Father,Siblings, Kidney Disease - Siblings, Lung Disease - Father, Stroke - Mother, Thyroid Problems - Child, No family history of Hereditary Spherocytosis, Seizures, Tuberculosis. Social History Never smoker, Marital Status - Married, Alcohol Use - Never, Drug Use - No History, Caffeine Use - Daily - coffee. Medical History Eyes Denies history of Cataracts, Glaucoma, Optic  Neuritis Ear/Nose/Mouth/Throat Denies history of Chronic sinus problems/congestion, Middle ear problems Hematologic/Lymphatic Patient has history of Anemia - iron, Lymphedema - right arm Denies history of Hemophilia, Human Immunodeficiency Virus, Sickle Cell Disease Respiratory Denies history of Aspiration, Asthma, Chronic Obstructive Pulmonary Disease (COPD), Pneumothorax, Sleep Apnea, Tuberculosis Cardiovascular Patient has history of Deep Vein Thrombosis - right leg 2013, Hypertension, Peripheral Venous Disease Denies history of Angina, Arrhythmia, Congestive Heart Failure, Coronary Artery Disease, Hypotension, Myocardial Infarction, Peripheral Arterial Disease, Phlebitis, Vasculitis Gastrointestinal Denies history of Cirrhosis , Colitis, Crohnoos, Hepatitis A, Hepatitis B, Hepatitis C Endocrine Denies history of Type I Diabetes, Type II Diabetes Genitourinary Denies history of End Stage Renal Disease Immunological Denies history of Lupus Erythematosus, Raynaudoos, Scleroderma Integumentary (  Skin) Denies history of History of Burn Musculoskeletal Patient has history of Osteoarthritis Denies history of Gout, Rheumatoid Arthritis, Osteomyelitis Neurologic Denies history of Dementia, Neuropathy, Quadriplegia, Paraplegia, Seizure Disorder Oncologic Patient has history of Received Chemotherapy - 1998, Received Radiation - 2002 and 2012 Psychiatric Patient has history of Confinement Anxiety Denies history of Anorexia/bulimia Hospitalization/Surgery History - right breast mastectomy 1998. - cholecystectomy. - left hip replacement 2012. - right hip replacement 2019. Medical A Surgical History Notes nd Oncologic Right breast mastectomy 1998 2002 small area found- radiation 2012-same area small area found radiation Review of Systems (ROS) Constitutional Symptoms ([place]) Denies complaints or symptoms of Fatigue, Fever, Chills, Marked Weight Change. Eyes Complains or has  symptoms of Glasses / Contacts - reading glasses. Denies complaints or symptoms of Dry Eyes, Vision Changes. Ear/Nose/Mouth/Throat Denies complaints or symptoms of Chronic sinus problems or rhinitis. Respiratory Denies complaints or symptoms of Chronic or frequent coughs, Shortness of Breath. Cardiovascular Denies complaints or symptoms of Chest pain. Gastrointestinal Denies complaints or symptoms of Frequent diarrhea, Nausea, Vomiting. Endocrine Denies complaints or symptoms of Heat/cold intolerance. Genitourinary Denies complaints or symptoms of Frequent urination. Integumentary (Skin) Complains or has symptoms of Wounds - right breast wound.. Musculoskeletal Denies complaints or symptoms of Muscle Pain, Muscle Weakness. Neurologic Denies complaints or symptoms of Numbness/parasthesias. Psychiatric Complains or has symptoms of Claustrophobia. Denies complaints or symptoms of Suicidal. Objective Constitutional sitting or standing blood pressure is within target range for patient.. pulse regular and within target range for patient.Marland Kitchen respirations regular, non-labored and within target range for patient.Marland Kitchen temperature within target range for patient.. [name] and well-hydrated in no acute distress. Vitals Time Taken: 1:13 PM, Height: 63 in, Source: Stated, Weight: 176 lbs, Source: Stated, BMI: 31.2, Temperature: 89.4 F, Pulse: 86 bpm, Respiratory Rate: 20 breaths/min, Blood Pressure: 138/64 mmHg. Eyes conjunctiva clear no eyelid edema noted. pupils equal round and reactive to light and accommodation. Ears, Nose, Mouth, and Throat no gross abnormality of ear auricles or external auditory canals. normal hearing noted during conversation. mucus membranes moist. Respiratory normal breathing without difficulty. Cardiovascular 2+ dorsalis pedis/posterior tibialis pulses. no clubbing, cyanosis, significant edema, Musculoskeletal normal gait and posture. no significant deformity  or arthritic changes, no loss or range of motion, no clubbing. Psychiatric this patient is able to make decisions and demonstrates good insight into disease process. Alert and Oriented x 3. pleasant and cooperative. General Notes: Upon inspection patient's wound bed actually showed signs of significant scar tissue secondary to the soft tissue radionecrosis following chemotherapy and specifically radiation therapy in 2002 initially and then repeated in 2012 for recurrence. Subsequently the patient tells me that she really does not have any significant pain but that off and on she has had issues with this wound for quite some time. Obviously I do think that the scar tissue and the radiation damage is a significant cause of issue here but I do want to make sure that from a cancer standpoint there is nothing reoccurring she does see her oncologist on December 6 we will see what he has to say at that point. Integumentary (Hair, Skin) Wound #1 status is Open. Original cause of wound was Radiation Burn. The wound is located on the Right Breast (mastectomy site). The wound measures 1.3cm length x 1.5cm width x 0.1cm depth; 1.532cm^2 area and 0.153cm^3 volume. There is Fat Layer (Subcutaneous Tissue) exposed. There is no tunneling or undermining noted. There is a medium amount of serosanguineous drainage noted. The wound margin is distinct with  the outline attached to the wound base. There is small (1-33%) pink granulation within the wound bed. There is a large (67-100%) amount of necrotic tissue within the wound bed including Adherent Slough. Assessment Active Problems ICD-10 Non-pressure chronic ulcer of skin of other sites with fat layer exposed Radiodermatitis, unspecified Other specified disorders of the skin and subcutaneous tissue related to radiation Essential (primary) hypertension Procedures Wound #1 Pre-procedure diagnosis of Wound #1 is a Soft Tissue Radionecrosis located on the Right  Breast (mastectomy site) . There was a Chemical/Enzymatic/Mechanical debridement performed by Worthy Keeler, PA.. Other agent used was anasept and gauze. A time out was conducted at 13:30, prior to the start of the procedure. There was no bleeding. The procedure was tolerated well. Post Debridement Measurements: 1.3cm length x 1.5cm width x 0.1cm depth; 0.153cm^3 volume. Character of Wound/Ulcer Post Debridement is improved. Post procedure Diagnosis Wound #1: Same as Pre-Procedure Plan Follow-up Appointments: Return appointment in 3 weeks. Dressing Change Frequency: Wound #1 Right Breast (mastectomy site): Change Dressing every other day. Skin Barriers/Peri-Wound Care: Moisturizing lotion - to scar tissue daily Wound Cleansing: May shower and wash wound with soap and water. Primary Wound Dressing: Wound #1 Right Breast (mastectomy site): Silver Collagen - moisten with saline Secondary Dressing: Wound #1 Right Breast (mastectomy site): ABD pad - secure with tape, no tape on scarred tissue or may use silicone foam border 1. Would recommend at this time that we actually initiate treatment with a silver collagen dressing will see how this does for her and the patient is in agreement with this plan. 2. I am also can recommend currently that we have the patient use either a silicone border foam dressing or potentially a larger ABD pad that she can take further out away from the scar tissue so that she does not cause any additional damage by putting a dressing on it. 3. I am also can recommend that she can clean this with mild soap and water I do not see any problem with that in my opinion. 4. With regard to the possibility of any reoccurrence of cancer I am going to defer this determination to her oncologist she sees him on December 6 if there is not felt to be any recurrence in this regard and we will subsequently proceed with a discussion about hyperbaric oxygen therapy to try to help out  as well depending on how she is progressing at that time with sufficient and good wound care. We will see patient back for reevaluation in 3 weeks here in the clinic. If anything worsens or changes patient will contact our office for additional recommendations. Electronic Signature(s) Signed: 08/22/2020 5:46:23 PM By: Worthy Keeler PA-C Entered By: Worthy Keeler on 08/22/2020 17:46:22 -------------------------------------------------------------------------------- HxROS Details Patient Name: Date of Service: [name], [name] 08/22/2020 1:15 PM Medical Record Number: [id number] Patient Account Number: [id number] Date of Birth/Sex: Treating RN: 1951/10/01 (69 y.o. Female) [name] Primary Care Provider: HO DGES, Tamela Oddi Other Clinician: Referring Provider: Treating Provider/Extender: Worthy Keeler HO DGES, FRA NCISCO Weeks in Treatment: 0 Information Obtained From Patient Constitutional Symptoms (General Health) Complaints and Symptoms: Negative for: Fatigue; Fever; Chills; Marked Weight Change Eyes Complaints and Symptoms: Positive for: Glasses / Contacts - reading glasses Negative for: Dry Eyes; Vision Changes Medical History: Negative for: Cataracts; Glaucoma; Optic Neuritis Ear/Nose/Mouth/Throat Complaints and Symptoms: Negative for: Chronic sinus problems or rhinitis Medical History: Negative for: Chronic sinus problems/congestion; Middle ear problems Respiratory Complaints and Symptoms: Negative for:  Chronic or frequent coughs; Shortness of Breath Medical History: Negative for: Aspiration; Asthma; Chronic Obstructive Pulmonary Disease (COPD); Pneumothorax; Sleep Apnea; Tuberculosis Cardiovascular Complaints and Symptoms: Negative for: Chest pain Medical History: Positive for: Deep Vein Thrombosis - right leg 2013; Hypertension; Peripheral Venous Disease Negative for: Angina; Arrhythmia; Congestive Heart Failure; Coronary Artery Disease; Hypotension;  Myocardial Infarction; Peripheral Arterial Disease; Phlebitis; Vasculitis Gastrointestinal Complaints and Symptoms: Negative for: Frequent diarrhea; Nausea; Vomiting Medical History: Negative for: Cirrhosis ; Colitis; Crohns; Hepatitis A; Hepatitis B; Hepatitis C Endocrine Complaints and Symptoms: Negative for: Heat/cold intolerance Medical History: Negative for: Type I Diabetes; Type II Diabetes Genitourinary Complaints and Symptoms: Negative for: Frequent urination Medical History: Negative for: End Stage Renal Disease Integumentary (Skin) Complaints and Symptoms: Positive for: Wounds - right breast wound. Medical History: Negative for: History of Burn Musculoskeletal Complaints and Symptoms: Negative for: Muscle Pain; Muscle Weakness Medical History: Positive for: Osteoarthritis Negative for: Gout; Rheumatoid Arthritis; Osteomyelitis Neurologic Complaints and Symptoms: Negative for: Numbness/parasthesias Medical History: Negative for: Dementia; Neuropathy; Quadriplegia; Paraplegia; Seizure Disorder Psychiatric Complaints and Symptoms: Positive for: Claustrophobia Negative for: Suicidal Medical History: Positive for: Confinement Anxiety Negative for: Anorexia/bulimia Hematologic/Lymphatic Medical History: Positive for: Anemia - iron; Lymphedema - right arm Negative for: Hemophilia; Human Immunodeficiency Virus; Sickle Cell Disease Immunological Medical History: Negative for: Lupus Erythematosus; Raynauds; Scleroderma Oncologic Medical History: Positive for: Received Chemotherapy - 1998; Received Radiation - 2002 and 2012 Past Medical History Notes: Right breast mastectomy 1998 2002 small area found- radiation 2012-same area small area found radiation Immunizations Pneumococcal Vaccine: Received Pneumococcal Vaccination: Yes Implantable Devices None Hospitalization / Surgery History Type of Hospitalization/Surgery right breast mastectomy  1998 cholecystectomy left hip replacement 2012 right hip replacement 2019 Family and Social History Cancer: Yes - Mother,Father,Paternal Grandparents; Diabetes: Yes - Mother,Siblings; Heart Disease: Yes - Siblings; Hereditary Spherocytosis: No; Hypertension: Yes - Mother,Father,Siblings; Kidney Disease: Yes - Siblings; Lung Disease: Yes - Father; Seizures: No; Stroke: Yes - Mother; Thyroid Problems: Yes - Child; Tuberculosis: No; Never smoker; Marital Status - Married; Alcohol Use: Never; Drug Use: No History; Caffeine Use: Daily - coffee; Financial Concerns: No; Food, Clothing or Shelter Needs: No; Support System Lacking: No; Transportation Concerns: No Electronic Signature(s) Signed: 08/22/2020 5:47:40 PM By: Worthy Keeler PA-C Signed: 08/22/2020 5:59:08 PM By: [name] Entered By: [name] on 08/22/2020 13:41:22 -------------------------------------------------------------------------------- SuperBill Details Patient Name: Date of Service: [name], [place] YE 08/22/2020 Medical Record Number: [id number] Patient Account Number: [id number] Date of Birth/Sex: Treating RN: December 05, 1950 (69 y.o. Female) Baruch Gouty Primary Care Provider: HO DGES, FRA NCISCO Other Clinician: Referring Provider: Treating Provider/Extender: Worthy Keeler HO DGES, FRA [place] Weeks in Treatment: 0 Diagnosis Coding ICD-10 Codes Code Description [phone] Non-pressure chronic ulcer of skin of other sites with fat layer exposed L58.9 Radiodermatitis, unspecified L59.8 Other specified disorders of the skin and subcutaneous tissue related to radiation I10 Essential (primary) hypertension Facility Procedures CPT4 Code: [id number] Description: 99214 - WOUND CARE VISIT-LEV 4 EST PT Modifier: Quantity: 1 Physician Procedures : CPT4 Code Description Modifier [id number] - WC PHYS LEVEL 4 - NEW PT ICD-10 Diagnosis Description L98.492 Non-pressure chronic ulcer of skin of other sites with fat layer  exposed L58.9 Radiodermatitis, unspecified L59.8 Other specified disorders of  the skin and subcutaneous tissue related to radiation I10 Essential (primary) hypertension Quantity: 1 Electronic Signature(s) Signed: 08/22/2020 5:47:21 PM By: Worthy Keeler PA-C Entered By: Worthy Keeler on 08/22/2020 17:47:21

## 2020-09-12 ENCOUNTER — Other Ambulatory Visit: Payer: Self-pay

## 2020-09-12 ENCOUNTER — Encounter (HOSPITAL_BASED_OUTPATIENT_CLINIC_OR_DEPARTMENT_OTHER): Payer: Medicare Other | Attending: Physician Assistant | Admitting: Physician Assistant

## 2020-09-12 DIAGNOSIS — Z9011 Acquired absence of right breast and nipple: Secondary | ICD-10-CM | POA: Diagnosis not present

## 2020-09-12 DIAGNOSIS — L598 Other specified disorders of the skin and subcutaneous tissue related to radiation: Secondary | ICD-10-CM | POA: Diagnosis not present

## 2020-09-12 DIAGNOSIS — Z6831 Body mass index (BMI) 31.0-31.9, adult: Secondary | ICD-10-CM | POA: Diagnosis not present

## 2020-09-12 DIAGNOSIS — I10 Essential (primary) hypertension: Secondary | ICD-10-CM | POA: Diagnosis not present

## 2020-09-12 DIAGNOSIS — L589 Radiodermatitis, unspecified: Secondary | ICD-10-CM | POA: Insufficient documentation

## 2020-09-12 DIAGNOSIS — E782 Mixed hyperlipidemia: Secondary | ICD-10-CM | POA: Diagnosis not present

## 2020-09-12 DIAGNOSIS — Y842 Radiological procedure and radiotherapy as the cause of abnormal reaction of the patient, or of later complication, without mention of misadventure at the time of the procedure: Secondary | ICD-10-CM | POA: Insufficient documentation

## 2020-09-12 DIAGNOSIS — N641 Fat necrosis of breast: Secondary | ICD-10-CM | POA: Diagnosis present

## 2020-09-12 DIAGNOSIS — L98492 Non-pressure chronic ulcer of skin of other sites with fat layer exposed: Secondary | ICD-10-CM | POA: Insufficient documentation

## 2020-09-12 DIAGNOSIS — I700 Atherosclerosis of aorta: Secondary | ICD-10-CM | POA: Diagnosis not present

## 2020-09-12 NOTE — Progress Notes (Addendum)
LYNORE, COSCIA ([id number]) Visit Report for 09/12/2020 Chief Complaint Document Details Patient Name: Date of Service: [name] 09/12/2020 10:15 A M Medical Record Number: [id number] Patient Account Number: [id number] Date of Birth/Sex: Treating RN: Aug 28, 1951 (69 y.o. 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 [name] Primary Care Provider: HO DGES, Tamela Oddi Other Clinician: Referring Provider: Treating Provider/Extender: Worthy Keeler HO DGES, FRA [place]ISCO Weeks in Treatment: 3 History of Present Illness HPI Description: 08/22/2020 upon evaluation today patient actually appears to be doing somewhat poorly in regard to her mastectomy site on the right chest wall. She had a mastectomy initially in 1998. She had chemotherapy only no radiation following. In 2002 she subsequently did undergo radiation for the first time and then subsequently in 2012 had repeat radiation after having had a finding that was consistent with a relapse of the cancer. 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 She also has radiation damage to the skin over the right chest wall where she had the mastectomy. She was referred to [place] by Dr. Matthew Saras in [facility] and her oncologist is Dr. Burney Gauze. Subsequently I want to make sure before we delve into this more deeply that the patient does not have any issues with a return of cancer that needs to be managed by them. 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 Nonetheless she does not remember exactly when her last PET scan was but it was not too recently she tells me. 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 The patient does have hypertension as well. She sees her oncologist next on December 6. 09/12/2020 on evaluation today patient actually appears to be doing excellent in regard to her wound under the right breast location. Currently this is measuring smaller in general seems to be doing very well. She tells me that the collagen is doing a good job and that the dressing that she has been putting on is not causing any troubles as far pulling on her skin or scar tissue is concerned all of which is excellent news. Electronic Signature(s) Signed: 09/12/2020 11:39:08 AM By: Worthy Keeler PA-C Entered By: Worthy Keeler on 09/12/2020 11:39:08 -------------------------------------------------------------------------------- Physical Exam Details Patient Name: Date of Service: [name], [name] 09/12/2020 10:15 A M Medical Record Number: [id number] Patient Account Number: [id number] Date of Birth/Sex: Treating RN: [birth year] (69 y.o. [name] Primary Care Provider: HO DGES, Tamela Oddi Other Clinician: Referring Provider: Treating Provider/Extender: Worthy Keeler HO DGES, FRA NCISCO Weeks in Treatment: 3 Constitutional [name] and well-hydrated in no acute distress. Respiratory normal breathing without difficulty. Psychiatric this patient is able to make decisions and demonstrates good insight into disease process. Alert and Oriented x 3. pleasant and  cooperative. Notes Inspection patient's wound bed actually showed signs  of good granulation at this time. There does not appear to be any evidence of active infection which is great news and overall very pleased with where things stand at this point. No debridement necessary nor performed today. Electronic Signature(s) Signed: 09/12/2020 11:39:26 AM By: Worthy Keeler PA-C Entered By: Worthy Keeler on 09/12/2020 11:39:25 -------------------------------------------------------------------------------- Physician Orders Details Patient Name: Date of Service: [name], [name] 09/12/2020 10:15 A M Medical Record Number: [id number] Patient Account Number: [id number] Date of Birth/Sex: Treating RN: 1951-02-12 (69 y.o. Benjaman Lobe Primary Care Provider: HO DGES, FRA NCISCO Other Clinician: Referring Provider: Treating Provider/Extender: Worthy Keeler HO DGES, FRA [place]IS[place] Weeks in Treatment: 3 Verbal / Phone Orders: No Diagnosis Coding ICD-10 Coding Code Description L98.492 Non-pressure chronic ulcer of skin of other sites with fat layer exposed L58.9 Radiodermatitis, unspecified L59.8 Other specified disorders of the skin and subcutaneous tissue related to radiation I10 Essential (primary) hypertension Follow-up Appointments Return appointment in 3 weeks. Dressing Change Frequency Wound #1 Right Breast (mastectomy site) Change Dressing every other day. Skin Barriers/Peri-Wound Care Moisturizing lotion - to scar tissue daily Wound Cleansing May shower and wash wound with soap and water. Primary Wound Dressing Wound #1 Right Breast (mastectomy site) Silver Collagen - moisten with saline Secondary Dressing Wound #1 Right Breast (mastectomy site) ABD pad - secure with tape, no tape on scarred tissue or may use silicone foam border Electronic Signature(s) Signed: 09/12/2020 4:34:22 PM By: Worthy Keeler PA-C Signed: 09/13/2020 5:27:06 PM By: Rhae Hammock RN Entered By:  Rhae Hammock on 09/12/2020 11:37:59 -------------------------------------------------------------------------------- Problem List Details Patient Name: Date of Service: [name], [name] 09/12/2020 10:15 A M Medical Record Number: [id number] Patient Account Number: [id number] Date of Birth/Sex: Treating RN: September 15, 1951 (69 y.o. [name] Primary Care Provider: HO DGES, Tamela Oddi Other Clinician: Referring Provider: Treating Provider/Extender: Worthy Keeler HO DGES, FRA NCISCO Weeks in Treatment: 3 Active Problems ICD-10 Encounter Code Description Active Date MDM Diagnosis L98.492 Non-pressure chronic ulcer of skin of other sites with fat layer exposed 08/22/2020 No Yes L58.9 Radiodermatitis, unspecified 08/22/2020 No Yes L59.8 Other specified disorders of the skin and subcutaneous tissue related to 08/22/2020 No Yes radiation I10 Essential (primary) hypertension 08/22/2020 No Yes Inactive Problems Resolved Problems Electronic Signature(s) Signed: 09/12/2020 10:25:58 AM By: Worthy Keeler PA-C Entered By: Worthy Keeler on 09/12/2020 10:25:58 -------------------------------------------------------------------------------- Progress Note Details Patient Name: Date of Service: [name], [name] 09/12/2020 10:15 A M Medical Record Number: [id number] Patient Account Number: [id number] Date of Birth/Sex: Treating RN: Oct 08, 1951 (69 y.o. 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 She had a mastectomy initially in 1998. She had  chemotherapy only no radiation following. In 2002 she subsequently did undergo radiation for the first time and then subsequently in 2012 had repeat radiation after having had a finding that was consistent with a relapse of the cancer. 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 She also has radiation damage to the skin over the right chest wall where she had the mastectomy. She was referred to [place] by Dr. Matthew Saras in  [place] and her oncologist is Dr. Burney Gauze. Subsequently I want to make sure before we delve into this more deeply that the patient does not have any issues with a return of cancer that needs to be managed by them. 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 Nonetheless she does not remember exactly when her last PET scan was but it was not too recently she tells me. 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 The patient does have hypertension as well. She sees her oncologist next on December 6. 09/12/2020 on evaluation today patient actually appears to be doing excellent in regard to her wound under the right breast location. Currently this is measuring smaller in general seems to be doing very well. She tells me that the collagen is doing a good job and that the dressing that she has been putting on is not causing any troubles as far pulling on her skin or scar tissue is concerned all of which is excellent news. Objective Constitutional [name] and well-hydrated in no acute distress. Vitals Time Taken: 10:55 AM, Height: 63 in, Weight: 176 lbs, BMI: 31.2, Temperature: 98.2 F, Pulse: 74 bpm, Respiratory Rate: 18 breaths/min, Blood Pressure: 165/78 mmHg. Respiratory normal breathing without difficulty. Psychiatric this patient is able to make decisions and  demonstrates good insight into disease process. Alert and Oriented x 3. pleasant and cooperative. General Notes: Inspection patient's wound bed actually showed signs of good granulation at this time. There does not appear to be any evidence of active infection which is great news and overall very pleased with where things stand at this point. No debridement necessary nor performed today. Integumentary (Hair, Skin) Wound #1 status is Open. Original cause of wound was Radiation Burn. The wound is located on the Right Breast (mastectomy site). The wound measures 0.7cm length x 1.1cm width x 0.1cm depth; 0.605cm^2 area and 0.06cm^3 volume. There is Fat Layer (Subcutaneous Tissue) exposed. There is no tunneling or undermining noted. There is a medium amount of serosanguineous drainage noted. The wound margin is distinct with the outline attached to the wound base. There is large (67-100%) pink granulation within the wound bed. There is a small (1-33%) amount of necrotic tissue within the wound bed including Adherent Slough. 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 Dressing Change Frequency: Wound #1 Right Breast (mastectomy site): Change Dressing every other day. Skin Barriers/Peri-Wound Care: Moisturizing lotion - to scar tissue daily Wound Cleansing: May shower and wash wound with soap and water. Primary Wound Dressing: Wound #1 Right Breast (mastectomy site): Silver Collagen - moisten with saline Secondary Dressing: Wound #1 Right Breast (mastectomy site): ABD pad - secure with tape, no tape on scarred tissue or may use silicone foam border 1. I would recommend currently that we go and continue with the wound care  measures as before and the patient is in agreement with the plan. This includes the use of silver collagen to the wound bed. 2. I'm also can recommend at this time that we have the patient continue with a border foam dressing to cover this is very safe for her skin and has done an  excellent job. 3. I'm also can recommend the patient continue to shower as normal I see no issues in that regard. If she has any concerns she'll let me know. We will see patient back for reevaluation in 3 weeks here in the clinic. If anything worsens or changes patient will contact our office for additional recommendations. Electronic Signature(s) Signed: 09/12/2020 11:39:56 AM By: Worthy Keeler PA-C Entered By: Worthy Keeler on 09/12/2020 11:39:56 -------------------------------------------------------------------------------- SuperBill Details Patient Name: Date of Service: [name], [place] YE 09/12/2020 Medical Record Number: [id number] Patient Account Number: [id number] Date of Birth/Sex: Treating RN: 11-Aug-1951 (69 y.o. Benjaman Lobe Primary Care Provider: HO DGES, Tamela Oddi Other Clinician: Referring Provider: Treating Provider/Extender: Worthy Keeler HO DGES, FRA NCISCO Weeks in Treatment: 3 Diagnosis Coding ICD-10 Codes Code Description [phone] Non-pressure chronic ulcer of skin of other sites with fat layer exposed L58.9 Radiodermatitis, unspecified L59.8 Other specified disorders of the skin and subcutaneous tissue related to radiation I10 Essential (primary) hypertension Facility Procedures The patient participates with Medicare or their insurance follows the Medicare Facility Guidelines: CPT4 Code Description Modifier Quantity [id number] [place] VISIT-LEV 3 EST PT 1 Physician Procedures : CPT4 Code Description Modifier [id number] - WC PHYS LEVEL 3 - EST PT ICD-10 Diagnosis Description L98.492 Non-pressure chronic ulcer of skin of other sites with fat layer exposed L58.9  Radiodermatitis, unspecified L59.8 Other specified disorders of  the skin and subcutaneous tissue related to radiation I10 Essential (primary) hypertension Quantity: 1 Electronic Signature(s) Signed: 09/12/2020 11:40:08 AM By: Worthy Keeler PA-C Entered By: Worthy Keeler on 09/12/2020 11:40:08

## 2020-09-13 DIAGNOSIS — I10 Essential (primary) hypertension: Secondary | ICD-10-CM | POA: Diagnosis not present

## 2020-09-13 DIAGNOSIS — R7301 Impaired fasting glucose: Secondary | ICD-10-CM | POA: Diagnosis not present

## 2020-09-13 DIAGNOSIS — E782 Mixed hyperlipidemia: Secondary | ICD-10-CM | POA: Diagnosis not present

## 2020-09-13 NOTE — Progress Notes (Signed)
BASIL, BLAKESLEY ([id number]) Visit Report for 09/12/2020 Arrival Information Details Patient Name: Date of Service: [name] 09/12/2020 10:15 A M Medical Record Number: [id number] Patient Account Number: [id number] Date of Birth/Sex: Treating RN: 1951-09-27 (69 y.o. [name], Meta.Reding Primary Care Adonus Uselman: HO DGES, FRA NCISCO Other Clinician: Referring Nikira Kushnir: Treating Antwone Capozzoli/Extender: Worthy Keeler HO DGES, FRA NCISCO Weeks in Treatment: 3 Visit Information History Since Last Visit Added or deleted any medications: No Patient Arrived: Ambulatory Any new allergies or adverse reactions: No Arrival Time: 10:55 Had a fall or experienced change in No Accompanied By: self activities of daily living that may affect Transfer Assistance: None risk of falls: Patient Identification Verified: Yes Signs or symptoms of abuse/neglect since last visito No Secondary Verification Process Completed: Yes Hospitalized since last visit: No Patient Requires Transmission-Based Precautions: No Implantable device outside of the clinic excluding No Patient Has Alerts: No cellular tissue based products placed in the center since last visit: Has Dressing in Place as Prescribed: Yes Pain Present Now: No Electronic Signature(s) Signed: 09/12/2020 5:34:45 PM By: [name] Entered By: [name] on 09/12/2020 10:55:43 -------------------------------------------------------------------------------- Clinic Level of Care Assessment Details Patient Name: Date of Service: [name], [name] 09/12/2020 10:15 A M Medical Record Number: [id number] Patient Account Number: [id number] Date of Birth/Sex: Treating RN: 1951/06/02 (69 y.o. [name], [name] Primary Care Rishaan Gunner: HO DGES, FRA NCISCO Other Clinician: Referring Russel Morain: Treating Khamarion Bjelland/Extender: Worthy Keeler HO DGES, FRA NCISCO Weeks in Treatment: 3 Clinic Level of Care Assessment Items TOOL 4 Quantity Score X- 1 0 Use when only an EandM  is performed on FOLLOW-UP visit ASSESSMENTS - Nursing Assessment / Reassessment X- 1 10 Reassessment of Co-morbidities (includes updates in patient status) X- 1 5 Reassessment of Adherence to Treatment Plan ASSESSMENTS - Wound and Skin A ssessment / Reassessment X - Simple Wound Assessment / Reassessment - one wound 1 5 []  - 0 Complex Wound Assessment / Reassessment - multiple wounds X- 1 10 Dermatologic / Skin Assessment (not related to wound area) ASSESSMENTS - Focused Assessment []  - 0 Circumferential Edema Measurements - multi extremities []  - 0 Nutritional Assessment / Counseling / Intervention []  - 0 Lower Extremity Assessment (monofilament, tuning fork, pulses) []  - 0 Peripheral Arterial Disease Assessment (using hand held doppler) ASSESSMENTS - Ostomy and/or Continence Assessment and Care []  - 0 Incontinence Assessment and Management []  - 0 Ostomy Care Assessment and Management (repouching, etc.) PROCESS - Coordination of Care X - Simple Patient / Family Education for ongoing care 1 15 []  - 0 Complex (extensive) Patient / Family Education for ongoing care X- 1 10 Staff obtains Programmer, systems, Records, T Results / Process Orders est []  - 0 Staff telephones HHA, Nursing Homes / Clarify orders / etc []  - 0 Routine Transfer to another Facility (non-emergent condition) []  - 0 Routine Hospital Admission (non-emergent condition) []  - 0 New Admissions / Biomedical engineer / Ordering NPWT Apligraf, etc. , []  - 0 Emergency Hospital Admission (emergent condition) X- 1 10 Simple Discharge Coordination []  - 0 Complex (extensive) Discharge Coordination PROCESS - Special Needs []  - 0 Pediatric / Minor Patient Management []  - 0 Isolation Patient Management []  - 0 Hearing / Language / Visual special needs []  - 0 Assessment of Community assistance (transportation, D/C planning, etc.) []  - 0 Additional assistance / Altered mentation []  - 0 Support Surface(s)  Assessment (bed, cushion, seat, etc.) INTERVENTIONS - Wound Cleansing / Measurement X - Simple Wound Cleansing - one wound 1 5 []  -  0 Complex Wound Cleansing - multiple wounds X- 1 5 Wound Imaging (photographs - any number of wounds) []  - 0 Wound Tracing (instead of photographs) X- 1 5 Simple Wound Measurement - one wound []  - 0 Complex Wound Measurement - multiple wounds INTERVENTIONS - Wound Dressings X - Small Wound Dressing one or multiple wounds 1 10 []  - 0 Medium Wound Dressing one or multiple wounds []  - 0 Large Wound Dressing one or multiple wounds X- 1 5 Application of Medications - topical []  - 0 Application of Medications - injection INTERVENTIONS - Miscellaneous []  - 0 External ear exam []  - 0 Specimen Collection (cultures, biopsies, blood, body fluids, etc.) []  - 0 Specimen(s) / Culture(s) sent or taken to Lab for analysis []  - 0 Patient Transfer (multiple staff / Civil Service fast streamer / Similar devices) []  - 0 Simple Staple / Suture removal (25 or less) []  - 0 Complex Staple / Suture removal (26 or more) []  - 0 Hypo / Hyperglycemic Management (close monitor of Blood Glucose) []  - 0 Ankle / Brachial Index (ABI) - do not check if billed separately X- 1 5 Vital Signs Has the patient been seen at the hospital within the last three years: Yes Total Score: 100 Level Of Care: New/Established - Level 3 Electronic Signature(s) Signed: 09/13/2020 5:27:06 PM By: Rhae Hammock RN Entered By: Rhae Hammock on 09/12/2020 11:38:50 -------------------------------------------------------------------------------- Encounter Discharge Information Details Patient Name: Date of Service: [name], [name] 09/12/2020 10:15 A M Medical Record Number: [id number] Patient Account Number: [id number] Date of Birth/Sex: Treating RN: 1951-01-20 (69 y.o. Benjaman Lobe Primary Care Bruin Bolger: HO DGES, Tamela Oddi Other Clinician: Referring Demerius Podolak: Treating Leina Babe/Extender: Worthy Keeler HO DGES, FRA NCISCO Weeks in Treatment: 3 Encounter Discharge Information Items Discharge Condition: Stable Ambulatory Status: Ambulatory Discharge Destination: Home Transportation: Private Auto Accompanied By: self Schedule Follow-up Appointment: Yes Clinical Summary of Care: Patient Declined Electronic Signature(s) Signed: 09/13/2020 5:27:06 PM By: Rhae Hammock RN Entered By: Rhae Hammock on 09/12/2020 11:50:44 -------------------------------------------------------------------------------- Lower Extremity Assessment Details Patient Name: Date of Service: [name], [name] 09/12/2020 10:15 A M Medical Record Number: [id number] Patient Account Number: [id number] Date of Birth/Sex: Treating RN: 1951-01-14 (69 y.o. [name] Primary Care Jorell Agne: HO DGES, Tamela Oddi Other Clinician: Referring Araceli Coufal: Treating Yotam Rhine/Extender: Worthy Keeler HO DGES, FRA NCISCO Weeks in Treatment: 3 Electronic Signature(s) Signed: 09/12/2020 5:34:45 PM By: [name] Entered By: [name] on 09/12/2020 10:55:56 -------------------------------------------------------------------------------- Multi-Disciplinary Care Plan Details Patient Name: Date of Service: [name], [name] 09/12/2020 10:15 A M Medical Record Number: [id number] Patient Account Number: [id number] Date of Birth/Sex: Treating RN: 1951-01-17 (69 y.o. [name] Primary Care Jaramie Bastos: HO DGES, Tamela Oddi Other Clinician: Referring Ronne Stefanski: Treating Janah Mcculloh/Extender: Worthy Keeler HO DGES, FRA NCISCO Weeks in Treatment: 3 Active Inactive Wound/Skin Impairment Nursing Diagnoses: Impaired tissue integrity Knowledge deficit related to ulceration/compromised skin integrity Goals: Patient/caregiver will verbalize understanding of skin care regimen Date Initiated: 08/22/2020 Target Resolution Date: 09/19/2020 Goal Status: Active Ulcer/skin breakdown will have a volume reduction of 30% by week  4 Date Initiated: 08/22/2020 Target Resolution Date: 09/19/2020 Goal Status: Active Interventions: Assess patient/caregiver ability to obtain necessary supplies Assess patient/caregiver ability to perform ulcer/skin care regimen upon admission and as needed Assess ulceration(s) every visit Treatment Activities: Skin care regimen initiated : 08/22/2020 Topical wound management initiated : 08/22/2020 Notes: Electronic Signature(s) Signed: 09/12/2020 5:21:24 PM By: Baruch Gouty RN, BSN Entered By: Baruch Gouty on 09/12/2020 11:17:59 -------------------------------------------------------------------------------- Pain  Assessment Details Patient Name: Date of Service: [name] 09/12/2020 10:15 A M Medical Record Number: [id number] Patient Account Number: [id number] Date of Birth/Sex: Treating RN: July 23, 1951 (69 y.o. [name] Primary Care Tino Ronan: HO DGES, Tamela Oddi Other Clinician: Referring Tonique Mendonca: Treating Cadden Elizondo/Extender: Worthy Keeler HO DGES, FRA NCISCO Weeks in Treatment: 3 Active Problems Location of Pain Severity and Description of Pain Patient Has Paino No Site Locations Rate the pain. Rate the pain. Current Pain Level: 0 Pain Management and Medication Current Pain Management: Medication: No Cold Application: No Rest: No Massage: No Activity: No T.E.N.S.: No Heat Application: No Leg drop or elevation: No Is the Current Pain Management Adequate: Adequate How does your wound impact your activities of daily livingo Sleep: No Bathing: No Appetite: No Relationship With Others: No Bladder Continence: No Emotions: No Bowel Continence: No Work: No Toileting: No Drive: No Dressing: No Hobbies: No Electronic Signature(s) Signed: 09/12/2020 5:34:45 PM By: [name] Entered By: [name] on 09/12/2020 10:55:52 -------------------------------------------------------------------------------- Patient/Caregiver Education  Details Patient Name: Date of Service: [name], [name] 12/1/2021andnbsp10:15 A M Medical Record Number: [id number] Patient Account Number: [id number] Date of Birth/Gender: Treating RN: 1951-03-02 (69 y.o. [name] Primary Care Physician: HO DGES, Tamela Oddi Other Clinician: Referring Physician: Treating Physician/Extender: Worthy Keeler HO DGES, FRA NCISCO Weeks in Treatment: 3 Education Assessment Education Provided To: Patient Education Topics Provided Wound/Skin Impairment: Methods: Explain/Verbal Responses: Reinforcements needed, State content correctly Electronic Signature(s) Signed: 09/12/2020 5:21:24 PM By: Baruch Gouty RN, BSN Entered By: Baruch Gouty on 09/12/2020 11:18:36 -------------------------------------------------------------------------------- Wound Assessment Details Patient Name: Date of Service: [name], [name] 09/12/2020 10:15 A M Medical Record Number: [id number] Patient Account Number: [id number] Date of Birth/Sex: Treating RN: Aug 25, 1951 (69 y.o. [name], Meta.Reding Primary Care Lauran Romanski: HO DGES, FRA NCISCO Other Clinician: Referring Nephtali Docken: Treating Theone Bowell/Extender: Worthy Keeler HO DGES, FRA NCISCO Weeks in Treatment: 3 Wound Status Wound Number: 1 Primary Soft Tissue Radionecrosis Etiology: Wound Location: Right Breast (mastectomy site) Wound Open Wounding Event: Radiation Burn Status: Date Acquired: 07/26/2020 Comorbid Anemia, Lymphedema, Deep Vein Thrombosis, Hypertension, Weeks Of Treatment: 3 History: Peripheral Venous Disease, Osteoarthritis, Received Clustered Wound: No Chemotherapy, Received Radiation, Confinement Anxiety Wound Measurements Length: (cm) 0.7 Width: (cm) 1.1 Depth: (cm) 0.1 Area: (cm) 0.605 Volume: (cm) 0.06 % Reduction in Area: 60.5% % Reduction in Volume: 60.8% Epithelialization: Medium (34-66%) Tunneling: No Undermining: No Wound Description Classification: Full Thickness Without Exposed  Support Struct Wound Margin: Distinct, outline attached Exudate Amount: Medium Exudate Type: Serosanguineous Exudate Color: red, brown ures Foul Odor After Cleansing: No Slough/Fibrino Yes Wound Bed Granulation Amount: Large (67-100%) Exposed Structure Granulation Quality: Pink Fascia Exposed: No Necrotic Amount: Small (1-33%) Fat Layer (Subcutaneous Tissue) Exposed: Yes Necrotic Quality: Adherent Slough Tendon Exposed: No Muscle Exposed: No Joint Exposed: No Bone Exposed: No Treatment Notes Wound #1 (Right Breast (mastectomy site)) 1. Cleanse With Saline Wound Cleanser 2. Periwound Care Skin Prep 3. Primary Dressing Applied Collegen AG 4. Secondary Dressing Foam 5. Secured With Office manager) Signed: 09/12/2020 5:34:45 PM By: [name] Entered By: [name] on 09/12/2020 11:01:22 -------------------------------------------------------------------------------- Vitals Details Patient Name: Date of Service: [name], [name] 09/12/2020 10:15 A M Medical Record Number: [id number] Patient Account Number: [id number] Date of Birth/Sex: Treating RN: 1951-07-07 (69 y.o. [name] Primary Care Harding Thomure: HO DGES, Tamela Oddi Other Clinician: Referring Jeny Nield: Treating Geofrey Silliman/Extender: Worthy Keeler HO DGES, FRA NCISCO Weeks in Treatment: 3 Vital Signs  Time Taken: 10:55 Temperature (F): 98.2 Height (in): 63 Pulse (bpm): 74 Weight (lbs): 176 Respiratory Rate (breaths/min): 18 Body Mass Index (BMI): 31.2 Blood Pressure (mmHg): 165/78 Reference Range: 80 - 120 mg / dl Electronic Signature(s) Signed: 09/12/2020 5:34:45 PM By: [name] Entered By: [name] on 09/12/2020 10:57:01

## 2020-09-17 ENCOUNTER — Inpatient Hospital Stay: Payer: Medicare Other | Attending: Hematology & Oncology

## 2020-09-17 ENCOUNTER — Other Ambulatory Visit: Payer: Self-pay

## 2020-09-17 ENCOUNTER — Inpatient Hospital Stay (HOSPITAL_BASED_OUTPATIENT_CLINIC_OR_DEPARTMENT_OTHER): Payer: Medicare Other | Admitting: Hematology & Oncology

## 2020-09-17 VITALS — BP 142/52 | HR 69 | Temp 99.2°F | Resp 18 | Wt 172.0 lb

## 2020-09-17 DIAGNOSIS — C50011 Malignant neoplasm of nipple and areola, right female breast: Secondary | ICD-10-CM

## 2020-09-17 DIAGNOSIS — C50911 Malignant neoplasm of unspecified site of right female breast: Secondary | ICD-10-CM | POA: Diagnosis not present

## 2020-09-17 DIAGNOSIS — Z7982 Long term (current) use of aspirin: Secondary | ICD-10-CM | POA: Diagnosis not present

## 2020-09-17 DIAGNOSIS — Z79811 Long term (current) use of aromatase inhibitors: Secondary | ICD-10-CM | POA: Insufficient documentation

## 2020-09-17 DIAGNOSIS — Z923 Personal history of irradiation: Secondary | ICD-10-CM | POA: Diagnosis not present

## 2020-09-17 DIAGNOSIS — M2550 Pain in unspecified joint: Secondary | ICD-10-CM | POA: Diagnosis not present

## 2020-09-17 DIAGNOSIS — D500 Iron deficiency anemia secondary to blood loss (chronic): Secondary | ICD-10-CM

## 2020-09-17 LAB — CBC WITH DIFFERENTIAL (CANCER CENTER ONLY)
Abs Immature Granulocytes: 0.01 10*3/uL (ref 0.00–0.07)
Basophils Absolute: 0 10*3/uL (ref 0.0–0.1)
Basophils Relative: 1 %
Eosinophils Absolute: 0.2 10*3/uL (ref 0.0–0.5)
Eosinophils Relative: 4 %
HCT: 36.3 % (ref 36.0–46.0)
Hemoglobin: 11.3 g/dL — ABNORMAL LOW (ref 12.0–15.0)
Immature Granulocytes: 0 %
Lymphocytes Relative: 34 %
Lymphs Abs: 2 10*3/uL (ref 0.7–4.0)
MCH: 29 pg (ref 26.0–34.0)
MCHC: 31.1 g/dL (ref 30.0–36.0)
MCV: 93.3 fL (ref 80.0–100.0)
Monocytes Absolute: 0.4 10*3/uL (ref 0.1–1.0)
Monocytes Relative: 7 %
Neutro Abs: 3.2 10*3/uL (ref 1.7–7.7)
Neutrophils Relative %: 54 %
Platelet Count: 194 10*3/uL (ref 150–400)
RBC: 3.89 MIL/uL (ref 3.87–5.11)
RDW: 13.4 % (ref 11.5–15.5)
WBC Count: 5.9 10*3/uL (ref 4.0–10.5)
nRBC: 0 % (ref 0.0–0.2)

## 2020-09-17 LAB — FERRITIN: Ferritin: 549 ng/mL — ABNORMAL HIGH (ref 11–307)

## 2020-09-17 LAB — CMP (CANCER CENTER ONLY)
ALT: 11 U/L (ref 0–44)
AST: 13 U/L — ABNORMAL LOW (ref 15–41)
Albumin: 4.1 g/dL (ref 3.5–5.0)
Alkaline Phosphatase: 66 U/L (ref 38–126)
Anion gap: 7 (ref 5–15)
BUN: 21 mg/dL (ref 8–23)
CO2: 29 mmol/L (ref 22–32)
Calcium: 9.6 mg/dL (ref 8.9–10.3)
Chloride: 106 mmol/L (ref 98–111)
Creatinine: 0.74 mg/dL (ref 0.44–1.00)
GFR, Estimated: >60 mL/min (ref >60)
Glucose, Bld: 102 mg/dL — ABNORMAL HIGH (ref 70–99)
Potassium: 3.9 mmol/L (ref 3.5–5.1)
Sodium: 142 mmol/L (ref 135–145)
Total Bilirubin: 0.4 mg/dL (ref 0.3–1.2)
Total Protein: 7.3 g/dL (ref 6.5–8.1)

## 2020-09-17 LAB — IRON AND TIBC
Iron: 62 ug/dL (ref 41–142)
Saturation Ratios: 25 % (ref 21–57)
TIBC: 243 ug/dL (ref 236–444)
UIBC: 181 ug/dL (ref 120–384)

## 2020-09-17 NOTE — Progress Notes (Signed)
Hematology and Oncology Follow Up Visit  [name] [id number] 1951-07-31 69 y.o. 09/17/2020   Principle Diagnosis:  Locally recurrent adenocarcinoma of the right breast History of superficial venous thrombus of the right thigh  Current Therapy:        1. Aromasin 25 mg p.o. daily 2. Aspirin 81 mg p.o. daily   Interim History:  [name] is here today for follow-up.  She looks quite good.  She she is now seeing the wound clinic.  This is for the mastectomy wound on the right anterior chest wall.  She had radiation dermatitis.  She has a lot of telangiectasias.  The wound clinic seems to be doing a really good job.  She has a dressing on there.  I think she is putting on Silvadene cream.  Otherwise, she is doing okay.  She needed to have the stye over the left eye opened up.  That did not feel good.  She has had no bleeding.  She did have a nice Thanksgiving.  She is with her family.  She really loves taking care of her grandchildren.  She has had no problems with bowels or bladder.  We last saw her, the iron studies showed a ferritin of 565 with iron saturation of 29%.    She is still having some arthralgias.  I am not sure if this is secondary to the Aromasin.  Currently, her performance status is ECOG 1.     Medications:  Allergies as of 09/17/2020      Reactions   Contrast Media [iodinated Diagnostic Agents] Hives, Shortness Of Breath, Nausea And Vomiting   Allergic reaction even after pre-meds.   Metrizamide Hives, Shortness Of Breath, Nausea And Vomiting   Allergic reaction even after pre-meds.   Other Shortness Of Breath, Nausea And Vomiting, Rash   Allergic reaction even after pre-meds.   Brilliant Blue Fcf Hives, Nausea Only   Dye Fdc Blue [brilliant Blue Fcf (fd&c Blue #1)] Hives, Nausea Only      Medication List       Accurate as of September 17, 2020 10:48 AM. If you have any questions, ask your nurse or doctor.        STOP taking these medications    amoxicillin 500 MG capsule Commonly known as: AMOXIL Stopped by: [name], MD   doxycycline 100 MG tablet Commonly known as: VIBRA-TABS Stopped by: [name], MD   erythromycin ophthalmic ointment Stopped by: [name], MD     TAKE these medications   amLODipine 10 MG tablet Commonly known as: NORVASC Take 10 mg by mouth daily.   aspirin EC 81 MG tablet Take 81 mg by mouth daily. What changed: Another medication with the same name was removed. Continue taking this medication, and follow the directions you see here. Changed by: [name], MD   candesartan 32 MG tablet Commonly known as: ATACAND Take 32 mg by mouth daily.   exemestane 25 MG tablet Commonly known as: AROMASIN Take 1 tablet (25 mg total) by mouth daily.   hydrochlorothiazide 25 MG tablet Commonly known as: HYDRODIURIL Take 25 mg by mouth daily.   ibuprofen 200 MG tablet Commonly known as: ADVIL Take 200 mg by mouth every 6 (six) hours as needed.   metoprolol succinate 50 MG 24 hr tablet Commonly known as: TOPROL-XL Take 50 mg by mouth daily.   simvastatin 20 MG tablet Commonly known as: ZOCOR Take 20 mg by mouth daily.   Vitamin D (Ergocalciferol)  1.25 MG (50000 UNIT) Caps capsule Commonly known as: DRISDOL TAKE 1 CAPSULE BY MOUTH ONE TIME PER WEEK       Allergies:  Allergies  Allergen Reactions  . Contrast Media [Iodinated Diagnostic Agents] Hives, Shortness Of Breath and Nausea And Vomiting    Allergic reaction even after pre-meds.  . Metrizamide Hives, Shortness Of Breath and Nausea And Vomiting    Allergic reaction even after pre-meds.  . Other Shortness Of Breath, Nausea And Vomiting and Rash    Allergic reaction even after pre-meds.  Dillard [place] Blue Fcf Hives and Nausea Only  . Dye Fdc Blue [Brilliant Blue Fcf (Fd&C Blue #1)] Hives and Nausea Only    Past Medical History, Surgical history, Social history, and Family History were reviewed and  updated.  Review of Systems: Review of Systems  Constitutional: Negative.   HENT: Positive for hearing loss.   Eyes: Positive for discharge.  Respiratory: Negative.   Cardiovascular: Negative.   Gastrointestinal: Negative.   Genitourinary: Negative.   Musculoskeletal: Negative.   Skin: Negative.   Neurological: Negative.   Endo/Heme/Allergies: Negative.   Psychiatric/Behavioral: Negative.      Physical Exam:  weight is 172 lb (78 kg). Her oral temperature is 99.2 F (37.3 C). Her blood pressure is 142/52 (abnormal) and her pulse is 69. Her respiration is 18 and oxygen saturation is 99%.   Wt Readings from Last 3 Encounters:  09/17/20 172 lb (78 kg)  05/17/20 178 lb (80.7 kg)  01/16/20 207 lb (93.9 kg)   Physical Exam Vitals reviewed.  Constitutional:      Comments: On her breast exam, her left breast is without any masses, edema or erythema.  There is no left axillary adenopathy.  There is no discharge from the nipple on the left breast.  Right chest wall shows radiation dermatitis which is about the same.  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  There is no obvious exudate.  There is no tenderness.  There is no right axillary adenopathy.  HENT:     Head: Normocephalic and atraumatic.  Eyes:     Pupils: Pupils are equal, round, and reactive to light.     Comments: Her ocular exam does show this stye on the upper eyelid of the left eye.  There is an area of purulent material that looks like he might be coming out soon.  She has good extraocular muscle movement.  Pupils react appropriately.  Cardiovascular:     Rate and Rhythm: Normal rate and regular rhythm.     Heart sounds: Normal heart sounds.  Pulmonary:     Effort: Pulmonary effort is normal.     Breath sounds: Normal breath sounds.  Abdominal:     General: Bowel sounds are normal.     Palpations: Abdomen is soft.  Musculoskeletal:        General: No tenderness or deformity. Normal range of  motion.     Cervical back: Normal range of motion.  Lymphadenopathy:     Cervical: No cervical adenopathy.  Skin:    General: Skin is warm and dry.     Findings: No erythema or rash.  Neurological:     Mental Status: She is alert and oriented to person, place, and time.  Psychiatric:        Behavior: Behavior normal.        Thought Content: Thought content normal.        Judgment: Judgment normal.  Lab Results  Component Value Date   WBC 5.9 09/17/2020   HGB 11.3 (L) 09/17/2020   HCT 36.3 09/17/2020   MCV 93.3 09/17/2020   PLT 194 09/17/2020   Lab Results  Component Value Date   FERRITIN 565 (H) 05/17/2020   IRON 63 05/17/2020   TIBC 213 (L) 05/17/2020   UIBC 150 05/17/2020   IRONPCTSAT 29 05/17/2020   Lab Results  Component Value Date   RETICCTPCT 1.4 06/20/2011   RBC 3.89 09/17/2020   RETICCTABS 55.2 06/20/2011   No results found for: KPAFRELGTCHN, LAMBDASER, KAPLAMBRATIO No results found for: Kandis Cocking [facility] Lab Results  Component Value Date   TOTALPROTELP 6.6 08/14/2010   ALBUMINELP 53.8 (L) 08/14/2010   A1GS 5.3 (H) 08/14/2010   A2GS 14.3 (H) 08/14/2010   BETS 5.8 08/14/2010   BETA2SER 6.3 08/14/2010   GAMS 14.5 08/14/2010   MSPIKE NOT DET 08/14/2010   SPEI * 08/14/2010     Chemistry      Component Value Date/Time   NA 142 09/17/2020 0958   NA 146 (H) 07/23/2017 0900   NA 144 02/18/2017 0901   K 3.9 09/17/2020 0958   K 3.7 07/23/2017 0900   K 3.7 02/18/2017 0901   CL 106 09/17/2020 0958   CL 107 07/23/2017 0900   CO2 29 09/17/2020 0958   CO2 31 07/23/2017 0900   CO2 27 02/18/2017 0901   BUN 21 09/17/2020 0958   BUN 16 07/23/2017 0900   BUN 17.7 02/18/2017 0901   CREATININE 0.74 09/17/2020 0958   CREATININE 0.8 07/23/2017 0900   CREATININE 0.7 02/18/2017 0901      Component Value Date/Time   CALCIUM 9.6 09/17/2020 0958   CALCIUM 9.3 07/23/2017 0900   CALCIUM 9.3 02/18/2017 0901   ALKPHOS 66 09/17/2020 0958   ALKPHOS 67  07/23/2017 0900   ALKPHOS 78 02/18/2017 0901   AST 13 (L) 09/17/2020 0958   AST 13 02/18/2017 0901   ALT 11 09/17/2020 0958   ALT 20 07/23/2017 0900   ALT 12 02/18/2017 0901   BILITOT 0.4 09/17/2020 0958   BILITOT 0.54 02/18/2017 0901       Impression and Plan: [name] is a very pleasant 69 yo caucasian female with history of locally recurrent adenocarcinoma of the right breast with resection and radiation.    I am so happy that the wound clinic is involved.  I thought this was a fantastic idea by her family doctor.  At this point, we will see what her iron studies show.  Think we can now get her back in 6 months.  I just do not see any evidence of recurrent breast cancer.  [name] will talk about the possibility of hyperbaric oxygen for the right chest wall.  Again from my perspective, I do not have a problem with this.      [name], MD 12/6/202110:48 AM

## 2020-09-18 LAB — CANCER ANTIGEN 27.29: CA 27.29: 16.2 U/mL (ref 0.0–38.6)

## 2020-09-21 DIAGNOSIS — Z23 Encounter for immunization: Secondary | ICD-10-CM | POA: Diagnosis not present

## 2020-09-21 DIAGNOSIS — E782 Mixed hyperlipidemia: Secondary | ICD-10-CM | POA: Diagnosis not present

## 2020-09-21 DIAGNOSIS — R7301 Impaired fasting glucose: Secondary | ICD-10-CM | POA: Diagnosis not present

## 2020-09-21 DIAGNOSIS — I10 Essential (primary) hypertension: Secondary | ICD-10-CM | POA: Diagnosis not present

## 2020-09-21 DIAGNOSIS — C50919 Malignant neoplasm of unspecified site of unspecified female breast: Secondary | ICD-10-CM | POA: Diagnosis not present

## 2020-10-03 ENCOUNTER — Other Ambulatory Visit: Payer: Self-pay

## 2020-10-03 ENCOUNTER — Encounter (HOSPITAL_BASED_OUTPATIENT_CLINIC_OR_DEPARTMENT_OTHER): Payer: Medicare Other | Admitting: Physician Assistant

## 2020-10-03 DIAGNOSIS — L98492 Non-pressure chronic ulcer of skin of other sites with fat layer exposed: Secondary | ICD-10-CM | POA: Diagnosis not present

## 2020-10-03 DIAGNOSIS — I10 Essential (primary) hypertension: Secondary | ICD-10-CM | POA: Diagnosis not present

## 2020-10-03 DIAGNOSIS — L598 Other specified disorders of the skin and subcutaneous tissue related to radiation: Secondary | ICD-10-CM | POA: Diagnosis not present

## 2020-10-03 DIAGNOSIS — L589 Radiodermatitis, unspecified: Secondary | ICD-10-CM | POA: Diagnosis not present

## 2020-10-03 DIAGNOSIS — Z9011 Acquired absence of right breast and nipple: Secondary | ICD-10-CM | POA: Diagnosis not present
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
## 2020-10-03 NOTE — Progress Notes (Signed)
[name], [name] ([id number]) Visit Report for 10/03/2020 Arrival Information Details Patient Name: Date of Service: [name] 10/03/2020 9:00 A M Medical Record Number: [id number] Patient Account Number: [id number] Date of Birth/Sex: Treating RN: May 08, 1951 (69 y.o. [name], [name] Primary Care Ragna Kramlich: HO DGES, Tamela Oddi Other Clinician: Referring Jaivon Vanbeek: Treating Charlsie Fleeger/Extender: Worthy Keeler HO DGES, FRA [employer]IS[employer] Weeks in Treatment: 6 Visit Information History Since Last Visit Added or deleted any medications: No Patient Arrived: Ambulatory Any new allergies or adverse reactions: No Arrival Time: 08:50 Had a fall or experienced change in No Accompanied By: self activities of daily living that may affect Transfer Assistance: None risk of falls: Patient Identification Verified: Yes Signs or symptoms of abuse/neglect since last visito No Secondary Verification Process Completed: Yes Hospitalized since last visit: No Patient Requires Transmission-Based Precautions: No Implantable device outside of the clinic excluding No Patient Has Alerts: No cellular tissue based products placed in the center since last visit: Has Dressing in Place as Prescribed: Yes Pain Present Now: No Electronic Signature(s) Signed: 10/03/2020 5:16:00 PM By: Rhae Hammock RN Entered By: Rhae Hammock on 10/03/2020 08:53:56 -------------------------------------------------------------------------------- Clinic Level of Care Assessment Details Patient Name: Date of Service: [name], [name] 10/03/2020 9:00 A M Medical Record Number: [id number] Patient Account Number: [id number] Date of Birth/Sex: Treating RN: October 14, 1950 (69 y.o. [name] Primary Care [name]: HO DGES, FRA NCISCO Other Clinician: Referring Johnnathan Hagemeister: Treating Malan Werk/Extender: Worthy Keeler HO DGES, FRA [employer]IS[employer] Weeks in Treatment: 6 Clinic Level of Care Assessment Items TOOL 4 Quantity Score X- 1 0 Use when  only an EandM is performed on FOLLOW-UP visit ASSESSMENTS - Nursing Assessment / Reassessment X- 1 10 Reassessment of Co-morbidities (includes updates in patient status) X- 1 5 Reassessment of Adherence to Treatment Plan ASSESSMENTS - Wound and Skin A ssessment / Reassessment X - Simple Wound Assessment / Reassessment - one wound 1 5 _0  - 0 Complex Wound Assessment / Reassessment - multiple wounds _1  - 0 Dermatologic / Skin Assessment (not related to wound area) ASSESSMENTS - Focused Assessment _2  - 0 Circumferential Edema Measurements - multi extremities _3  - 0 Nutritional Assessment / Counseling / Intervention _4  - 0 Lower Extremity Assessment (monofilament, tuning fork, pulses) _5  - 0 Peripheral Arterial Disease Assessment (using hand held doppler) ASSESSMENTS - Ostomy and/or Continence Assessment and Care _6  - 0 Incontinence Assessment and Management _7  - 0 Ostomy Care Assessment and Management (repouching, etc.) PROCESS - Coordination of Care X - Simple Patient / Family Education for ongoing care 1 15 _8  - 0 Complex (extensive) Patient / Family Education for ongoing care X- 1 10 Staff obtains Programmer, systems, Records, T Results / Process Orders est _9  - 0 Staff telephones HHA, Nursing Homes / Clarify orders / etc _10  - 0 Routine Transfer to another Facility (non-emergent condition) _11  - 0 Routine Hospital Admission (non-emergent condition) _12  - 0 New Admissions / Biomedical engineer / Ordering NPWT Apligraf, etc. , _13  - 0 Emergency Hospital Admission (emergent condition) X- 1 10 Simple Discharge Coordination _14  - 0 Complex (extensive) Discharge Coordination PROCESS - Special Needs _15  - 0 Pediatric / Minor Patient Management _16  - 0 Isolation Patient Management _17  - 0 Hearing / Language / Visual special needs _18  - 0 Assessment of Community assistance (transportation, D/C planning, etc.) _19  - 0 Additional assistance / Altered mentation _20  - 0 Support  Surface(s) Assessment (bed, cushion, seat, etc.) INTERVENTIONS - Wound Cleansing / Measurement X - Simple Wound Cleansing - one wound 1  5 _0  - 0 Complex Wound Cleansing - multiple wounds X- 1 5 Wound Imaging (photographs - any number of wounds) _1  - 0 Wound Tracing (instead of photographs) X- 1 5 Simple Wound Measurement - one wound _2  - 0 Complex Wound Measurement - multiple wounds INTERVENTIONS - Wound Dressings X - Small Wound Dressing one or multiple wounds 1 10 _3  - 0 Medium Wound Dressing one or multiple wounds _4  - 0 Large Wound Dressing one or multiple wounds <EAVWUJWJXBJYNWGN>_5<\/AOZHYQMVHQIONGEX>_5  - 0 Application of Medications - topical <MWUXLKGMWNUUVOZD>_6<\/UYQIHKVQQVZDGLOV>_5  - 0 Application of Medications - injection INTERVENTIONS - Miscellaneous _7  - 0 External ear exam _8  - 0 Specimen Collection (cultures, biopsies, blood, body fluids, etc.) _9  - 0 Specimen(s) / Culture(s) sent or taken to Lab for analysis _10  - 0 Patient Transfer (multiple staff / Civil Service fast streamer / Similar devices) _11  - 0 Simple Staple / Suture removal (25 or less) _12  - 0 Complex Staple / Suture removal (26 or more) _13  - 0 Hypo / Hyperglycemic Management (close monitor of Blood Glucose) _14  - 0 Ankle / Brachial Index (ABI) - do not check if billed separately X- 1 5 Vital Signs Has the patient been seen at the hospital within the last three years: Yes Total Score: 85 Level Of Care: New/Established - Level 3 Electronic Signature(s) Signed: 10/03/2020 7:00:57 PM By: Levan Hurst RN, BSN Entered By: Levan Hurst on 10/03/2020 09:32:35 -------------------------------------------------------------------------------- Encounter Discharge Information Details Patient Name: Date of Service: [name], [name] 10/03/2020 9:00 A M Medical Record Number: [id number] Patient Account Number: [id number] Date of Birth/Sex: Treating RN: [birth year] (69 y.o. [name] Primary Care Jakeya Gherardi: HO DGES, Tamela Oddi Other Clinician: Referring Sakeena Teall: Treating Aldrick Derrig/Extender:  Worthy Keeler HO DGES, FRA [employer] Weeks in Treatment: 6 Encounter Discharge Information Items Discharge Condition: Stable Ambulatory Status: Ambulatory Discharge Destination: Home Transportation: Private Auto Accompanied By: self Schedule Follow-up Appointment: Yes Clinical Summary of Care: Electronic Signature(s) Signed: 10/03/2020 5:18:14 PM By: [name] Entered By: [name] on 10/03/2020 09:38:15 -------------------------------------------------------------------------------- Lower Extremity Assessment Details Patient Name: Date of Service: [name], [name] 10/03/2020 9:00 A M Medical Record Number: [id number] Patient Account Number: [id number] Date of Birth/Sex: Treating RN: 01/26/1951 (69 y.o. Benjaman Lobe Primary Care Yerlin Gasparyan: HO DGES, Tamela Oddi Other Clinician: Referring Emanuella Nickle: Treating Avier Jech/Extender: Worthy Keeler HO DGES, FRA [employer] Weeks in Treatment: 6 Electronic Signature(s) Signed: 10/03/2020 5:16:00 PM By: Rhae Hammock RN Entered By: Rhae Hammock on 10/03/2020 08:54:26 -------------------------------------------------------------------------------- Multi-Disciplinary Care Plan Details Patient Name: Date of Service: [name], [name] 10/03/2020 9:00 A M Medical Record Number: [id number] Patient Account Number: [id number] Date of Birth/Sex: Treating RN: 16-Sep-1951 (69 y.o. [name] Primary Care Patricie Geeslin: HO DGES, Tamela Oddi Other Clinician: Referring Meeya Goldin: Treating Danyale Ridinger/Extender: Worthy Keeler HO DGES, FRA [employer] Weeks in Treatment: 6 Active Inactive Wound/Skin Impairment Nursing Diagnoses: Impaired tissue integrity Knowledge deficit related to ulceration/compromised skin integrity Goals: Patient/caregiver will verbalize understanding of skin care regimen Date Initiated: 08/22/2020 Target Resolution Date: 10/19/2020 Goal Status: Active Ulcer/skin breakdown will have a volume reduction of 30% by week  4 Date Initiated: 08/22/2020 Date Inactivated: 10/03/2020 Target Resolution Date: 09/19/2020 Goal Status: Met Interventions: Assess patient/caregiver ability to obtain necessary supplies Assess patient/caregiver ability to perform ulcer/skin care regimen upon admission and as needed Assess ulceration(s) every visit Treatment Activities: Skin care regimen initiated : 08/22/2020 Topical wound management initiated : 08/22/2020 Notes: Electronic Signature(s) Signed: 10/03/2020 7:00:57 PM By: Levan Hurst RN, BSN Entered By: Levan Hurst  on 10/03/2020 09:21:09 -------------------------------------------------------------------------------- Pain Assessment Details Patient Name: Date of Service: [name] 10/03/2020 9:00 A M Medical Record Number: [id number] Patient Account Number: [id number] Date of Birth/Sex: Treating RN: [birth year] (69 y.o. Benjaman Lobe Primary Care Tashari Schoenfelder: HO DGES, Tamela Oddi Other Clinician: Referring Rukaya Kleinschmidt: Treating Major Santerre/Extender: Worthy Keeler HO DGES, FRA [employer] Weeks in Treatment: 6 Active Problems Location of Pain Severity and Description of Pain Patient Has Paino No Site Locations Pain Management and Medication Current Pain Management: Electronic Signature(s) Signed: 10/03/2020 5:16:00 PM By: Rhae Hammock RN Entered By: Rhae Hammock on 10/03/2020 08:54:16 -------------------------------------------------------------------------------- Patient/Caregiver Education Details Patient Name: Date of Service: [name], [name] 12/22/2021andnbsp9:00 A M Medical Record Number: [id number] Patient Account Number: [id number] Date of Birth/Gender: Treating RN: [birth year] (69 y.o. [name] Primary Care Physician: HO DGES, Tamela Oddi Other Clinician: Referring Physician: Treating Physician/Extender: Worthy Keeler HO DGES, FRA [employer] Weeks in Treatment: 6 Education Assessment Education Provided To: Patient Education  Topics Provided Wound/Skin Impairment: Methods: Explain/Verbal Responses: State content correctly Electronic Signature(s) Signed: 10/03/2020 7:00:57 PM By: Levan Hurst RN, BSN Entered By: Levan Hurst on 10/03/2020 09:31:50 -------------------------------------------------------------------------------- Wound Assessment Details Patient Name: Date of Service: [name], [name] 10/03/2020 9:00 A M Medical Record Number: [id number] Patient Account Number: [id number] Date of Birth/Sex: Treating RN: Feb 12, 1951 (68 y.o. [name], [name] Primary Care Chimamanda Siegfried: HO DGES, Tamela Oddi Other Clinician: Referring Ameen Mostafa: Treating Aniya Jolicoeur/Extender: Worthy Keeler HO DGES, FRA [employer] Weeks in Treatment: 6 Wound Status Wound Number: 1 Primary Soft Tissue Radionecrosis Etiology: Wound Location: Right Breast (mastectomy site) Wound Open Wounding Event: Radiation Burn Status: Date Acquired: 07/26/2020 Comorbid Anemia, Lymphedema, Deep Vein Thrombosis, Hypertension, Weeks Of Treatment: 6 History: Peripheral Venous Disease, Osteoarthritis, Received Clustered Wound: No Chemotherapy, Received Radiation, Confinement Anxiety Wound Measurements Length: (cm) 0.5 Width: (cm) 0.5 Depth: (cm) 0.1 Area: (cm) 0.196 Volume: (cm) 0.02 % Reduction in Area: 87.2% % Reduction in Volume: 86.9% Epithelialization: Large (67-100%) Tunneling: No Undermining: No Wound Description Classification: Full Thickness Without Exposed Support Structures Wound Margin: Distinct, outline attached Exudate Amount: Medium Exudate Type: Serosanguineous Exudate Color: red, brown Foul Odor After Cleansing: No Slough/Fibrino Yes Wound Bed Granulation Amount: Medium (34-66%) Exposed Structure Granulation Quality: Pink Fascia Exposed: No Necrotic Amount: Medium (34-66%) Fat Layer (Subcutaneous Tissue) Exposed: Yes Necrotic Quality: Adherent Slough Tendon Exposed: No Muscle Exposed: No Joint Exposed: No Bone  Exposed: No Treatment Notes Wound #1 (Breast (mastectomy site)) Wound Laterality: Right Cleanser Soap and Water Discharge Instruction: May shower and wash wound with dial antibacterial soap and water prior to dressing change. Peri-Wound Care Sween Lotion (Moisturizing lotion) Discharge Instruction: Apply moisturizing lotion to scar tissue Topical Primary Dressing Promogran Prisma Matrix, 4.34 (sq in) (silver collagen) Discharge Instruction: Moisten collagen with saline or hydrogel Secondary Dressing ComfortFoam Border, 4x4 in (silicone border) Discharge Instruction: Apply over primary dressing as directed. Secured With Compression Wrap Compression Stockings Environmental education officer) Signed: 10/03/2020 5:16:00 PM By: Rhae Hammock RN Entered By: Rhae Hammock on 10/03/2020 08:59:39 -------------------------------------------------------------------------------- Vitals Details Patient Name: Date of Service: [name], [name] 10/03/2020 9:00 A M Medical Record Number: [id number] Patient Account Number: [id number] Date of Birth/Sex: Treating RN: 22-Jan-1951 (69 y.o. [name], [name] Primary Care Joven Mom: HO DGES, FRA [employer] Other Clinician: Referring Crysta Gulick: Treating Marybel Alcott/Extender: Worthy Keeler HO DGES, FRA [employer] Weeks in Treatment: 6 Vital Signs Time Taken: 08:54 Temperature (F): 98 Height (in): 63 Pulse (bpm): 74 Weight (lbs): 176 Respiratory Rate (breaths/min): 18 Body  Mass Index (BMI): 31.2 Blood Pressure (mmHg): 160/78 Reference Range: 80 - 120 mg / dl Electronic Signature(s) Signed: 10/03/2020 5:16:00 PM By: Rhae Hammock RN Entered By: Rhae Hammock on 10/03/2020 08:55:33

## 2020-10-09 ENCOUNTER — Other Ambulatory Visit: Payer: Self-pay | Admitting: Hematology & Oncology

## 2020-10-09 DIAGNOSIS — C50011 Malignant neoplasm of nipple and areola, right female breast: Secondary | ICD-10-CM

## 2020-10-10 DIAGNOSIS — Z23 Encounter for immunization: Secondary | ICD-10-CM | POA: Diagnosis not present

## 2020-10-24 ENCOUNTER — Encounter (HOSPITAL_BASED_OUTPATIENT_CLINIC_OR_DEPARTMENT_OTHER): Payer: Medicare Other | Attending: Physician Assistant | Admitting: Physician Assistant

## 2020-10-24 ENCOUNTER — Other Ambulatory Visit: Payer: Self-pay

## 2020-10-24 DIAGNOSIS — L598 Other specified disorders of the skin and subcutaneous tissue related to radiation: Secondary | ICD-10-CM | POA: Insufficient documentation

## 2020-10-24 DIAGNOSIS — I890 Lymphedema, not elsewhere classified: Secondary | ICD-10-CM | POA: Diagnosis not present

## 2020-10-24 DIAGNOSIS — L589 Radiodermatitis, unspecified: Secondary | ICD-10-CM | POA: Insufficient documentation

## 2020-10-24 DIAGNOSIS — I10 Essential (primary) hypertension: Secondary | ICD-10-CM | POA: Insufficient documentation

## 2020-10-24 DIAGNOSIS — L98492 Non-pressure chronic ulcer of skin of other sites with fat layer exposed: Secondary | ICD-10-CM | POA: Diagnosis not present

## 2020-10-24 DIAGNOSIS — Z9221 Personal history of antineoplastic chemotherapy: Secondary | ICD-10-CM | POA: Diagnosis not present

## 2020-10-24 DIAGNOSIS — Z86718 Personal history of other venous thrombosis and embolism: Secondary | ICD-10-CM | POA: Diagnosis not present

## 2020-10-24 DIAGNOSIS — Z9011 Acquired absence of right breast and nipple: Secondary | ICD-10-CM | POA: Insufficient documentation

## 2020-10-24 NOTE — Progress Notes (Addendum)
SHYAN, SCALISI ([id number]) Visit Report for 10/24/2020 Chief Complaint Document Details Patient Name: Date of Service: [name], [name] 10/24/2020 9:00 A M Medical Record Number: [id number] Patient Account Number: [id number] Date of Birth/Sex: Treating RN: June 25, 1951 (70 y.o. 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 [name] Primary Care Provider: HO DGES, FRA [place] Other Clinician: Referring Provider: Treating Provider/Extender: Worthy Keeler HO DGES, FRA [place] Weeks in Treatment: 9 Debridement Performed for Assessment: Wound #1 Right Breast (mastectomy site) Performed By: Clinician Baruch Gouty, RN Debridement Type: Chemical/Enzymatic/Mechanical Agent Used: gauze and anasept Level of Consciousness (Pre-procedure): Awake and Alert Pre-procedure Verification/Time Out Yes - 09:50 Taken: Instrument: Other : gauze Bleeding: Minimum Hemostasis Achieved: Pressure Response to Treatment: Procedure was tolerated well Level of Consciousness (Post- Awake and Alert procedure): Post Debridement Measurements of Total Wound Length: (cm) 0.6 Width: (cm) 0.5 Depth: (cm) 0.1 Volume: (cm) 0.024 Character of Wound/Ulcer Post Debridement: Improved Post Procedure Diagnosis Same as Pre-procedure Electronic Signature(s) Signed: 10/24/2020  5:15:08 PM By: Worthy Keeler PA-C Signed: 10/24/2020 5:15:42 PM By: Baruch Gouty RN, BSN Entered By: Baruch Gouty on 10/24/2020 09:52:15 -------------------------------------------------------------------------------- HPI Details Patient Name: Date of Service: [name], [name] 10/24/2020 9:00 A M Medical Record Number: [id number] Patient Account Number: [id number] Date of Birth/Sex: Treating RN: Jul 06, 1951 (70 y.o. F) Primary Care Provider: HO DGES, FRA [place] Other Clinician: Referring Provider: Treating Provider/Extender: Worthy Keeler HO DGES, FRA [place] Weeks in Treatment: 9 History of Present Illness HPI Description: 08/22/2020 upon evaluation today patient actually appears to be doing somewhat poorly in regard to her mastectomy site on the right chest wall. She had a mastectomy initially in 1998. She had chemotherapy only no radiation following. In 2002 she subsequently did undergo radiation for the first time and then subsequently in 2012 had repeat radiation after having had a finding that was consistent with a relapse of the cancer. 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 She also has radiation damage to the skin over the right chest wall where she had the mastectomy. She was referred to [place] by Dr. Matthew Saras in [facility] and her oncologist is Dr. Burney Gauze. Subsequently I want to make sure before we delve into this more deeply that the patient does not have any issues with a return of cancer that needs to be managed by them. 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 Nonetheless she does not remember exactly when her last PET scan was but it was not too recently she tells me. 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 The patient does have hypertension as  well. She sees her oncologist next on December 6. 09/12/2020 on evaluation today patient actually appears to be doing excellent in regard to her wound under the right breast location. Currently this is measuring smaller  in general seems to be doing very well. She tells me that the collagen is doing a good job and that the dressing that she has been putting on is not causing any troubles as far pulling on her skin or scar tissue is concerned all of which is excellent news. 10/03/2020 upon evaluation today patient appears to be doing well with regard to her surgical site from her mastectomy on the right breast region. She tells me that she has had very little bleeding nothing seems to be sticking badly and in general she has been extremely pleased with where things stand today. No fevers, chills, nausea, vomiting, or diarrhea. 10/24/2020 upon evaluation today patient actually appears to be doing excellent in regard to her wound. There is just a very small area still open and to be honest there was minimal slough noted on the surface of the wound nothing that requires sharp debridement. In general I feel like she is actually doing quite well and overall I am pleased. I do think we may switch to silver alginate dressing and also contemplating using a AandE ointment in order to help keep everything moist and the scar tissue region while the alginate keeps it dry enough to heal Electronic Signature(s) Signed: 10/24/2020 9:56:24 AM By: Worthy Keeler PA-C Entered By: Worthy Keeler on 10/24/2020 09:56:24 -------------------------------------------------------------------------------- Physical Exam Details Patient Name: Date of Service: [name], [name] 10/24/2020 9:00 A M Medical Record Number: [id number] Patient Account Number: [id number] Date of Birth/Sex: Treating RN: 1951-09-14 (70 y.o. F) Primary Care Provider: HO DGES, FRA [place] Other Clinician: Referring Provider: Treating Provider/Extender: Worthy Keeler HO DGES, FRA [place] Weeks in Treatment: 9 Constitutional [name] and well-hydrated in no acute distress. Respiratory normal breathing without difficulty. Psychiatric this patient is able to make decisions and demonstrates good insight into disease process. Alert and Oriented x 3. pleasant and cooperative. Notes With regard to the wound in general I did mechanically cleaned off some of the slough currently. I think we can use the AandE ointment currently to try to keep the area moist over the scar tissue and then alginate just to catch any drainage although I think there is very minimal. I believe this is very close to complete resolution. Electronic Signature(s) Signed: 10/24/2020 9:59:32 AM By: Worthy Keeler PA-C Entered By: Worthy Keeler on 10/24/2020 09:59:32 -------------------------------------------------------------------------------- Physician Orders Details Patient Name: Date of Service: [name], [name] 10/24/2020 9:00 A M Medical Record Number: [id number] Patient Account Number: [id number] Date of Birth/Sex: Treating RN: Jul 04, 1951 (70 y.o. [name] Primary Care Provider: HO DGES, FRA [place] Other Clinician: Referring Provider: Treating Provider/Extender: Worthy Keeler HO DGES, FRA [place] Weeks in Treatment: 9 Verbal / Phone Orders: No Diagnosis Coding ICD-10 Coding Code Description L98.492 Non-pressure chronic ulcer of skin of other sites with fat layer exposed L58.9 Radiodermatitis, unspecified L59.8 Other specified disorders of the skin and subcutaneous tissue related to radiation I10 Essential (primary) hypertension Follow-up Appointments Return appointment in 3 weeks. Bathing/ Shower/ Hygiene May shower and wash wound with soap and water. - on days that dressing is changed Wound Treatment Wound #1 - Breast (mastectomy site) Wound Laterality: Right Cleanser: Soap and Water Every Other Day/30 Days Discharge Instructions: May shower  and wash wound with dial antibacterial soap and water prior to dressing change. Peri-Wound Care: AandD Ointment Every Other Day/30 Days Prim Dressing: KerraCel Ag Gelling Fiber Dressing, 2x2 in (silver alginate) (DME) (Dispense As Written) Every Other Day/30 Days ary  Discharge Instructions: Apply silver alginate to wound bed as instructed Secondary Dressing: ComfortFoam Border, 4x4 in (silicone border) (DME) (Generic) Every Other Day/30 Days Discharge Instructions: Apply over primary dressing as directed. Electronic Signature(s) Signed: 10/24/2020 5:15:08 PM By: Worthy Keeler PA-C Signed: 10/24/2020 5:15:42 PM By: Baruch Gouty RN, BSN Entered By: Baruch Gouty on 10/24/2020 09:54:14 -------------------------------------------------------------------------------- Problem List Details Patient Name: Date of Service: [name], [name] 10/24/2020 9:00 A M Medical Record Number: [id number] Patient Account Number: [id number] Date of Birth/Sex: Treating RN: 04/27/1951 (70 y.o. F) Primary Care Provider: HO DGES, FRA [place] Other Clinician: Referring Provider: Treating Provider/Extender: Worthy Keeler HO DGES, FRA [place] Weeks in Treatment: 9 Active Problems ICD-10 Encounter Code Description Active Date MDM Diagnosis L98.492 Non-pressure chronic ulcer of skin of other sites with fat layer exposed 08/22/2020 No Yes L58.9 Radiodermatitis, unspecified 08/22/2020 No Yes L59.8 Other specified disorders of the skin and subcutaneous tissue related to 08/22/2020 No Yes radiation I10 Essential (primary) hypertension 08/22/2020 No Yes Inactive Problems Resolved Problems Electronic Signature(s) Signed: 10/24/2020 9:05:56 AM By: Worthy Keeler PA-C Entered By: Worthy Keeler on 10/24/2020 09:05:55 -------------------------------------------------------------------------------- Progress Note Details Patient Name: Date of Service: [name], [name] 10/24/2020 9:00 A M Medical Record Number:  [id number] Patient Account Number: [id number] Date of Birth/Sex: Treating RN: 24-Apr-1951 (70 y.o. 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 She had a mastectomy initially in 1998. She had chemotherapy only no radiation following. In 2002 she subsequently did undergo radiation for the first time and then subsequently in 2012 had repeat radiation after having had a finding that was consistent with a relapse of the cancer. 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 She also has radiation damage to the skin over the right chest wall where she had the mastectomy. She was referred to [place] by Dr. Matthew Saras in [facility] and her oncologist is Dr. Burney Gauze. Subsequently I want to make sure before we delve into this more deeply that the patient does not have any issues with a return of cancer that needs to be managed by them. 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 Nonetheless she does not remember exactly when her last PET scan was but it was not too recently she tells me. 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 The patient does have hypertension as well. She sees her oncologist next on December 6. 09/12/2020 on evaluation today patient actually appears to be doing excellent in regard to her wound under the right breast location. Currently this is measuring smaller  in general seems to be doing very well. She tells me that the collagen is doing a good job and that the dressing that she has been putting on is not causing any troubles as far pulling on her skin or scar tissue is concerned all of which is excellent news. 10/03/2020 upon evaluation today patient appears to be doing well with regard to her surgical  site from her mastectomy on the right breast region. She tells me that she has had very little bleeding nothing seems to be sticking badly and in general she has been extremely pleased with where things stand today. No fevers, chills, nausea, vomiting, or diarrhea. 10/24/2020 upon evaluation today patient actually appears to be doing excellent in regard to her wound. There is just a very small area still open and to be honest there was minimal slough noted on the surface of the wound nothing that requires sharp debridement. In general I feel like she is actually doing quite well and overall I am pleased. I do think we may switch to silver alginate dressing and also contemplating using a AandE ointment in order to help keep everything moist and the scar tissue region while the alginate keeps it dry enough to heal Objective Constitutional [name] and well-hydrated in no acute distress. Vitals Time Taken: 9:12 AM, Height: 63 in, Weight: 176 lbs, BMI: 31.2, Temperature: 97.5 F, Pulse: 69 bpm, Respiratory Rate: 18 breaths/min, Blood Pressure: 153/78 mmHg. Respiratory normal breathing without difficulty. Psychiatric this patient is able to make decisions and demonstrates good insight into disease process. Alert and Oriented x 3. pleasant and cooperative. General Notes: With regard to the wound in general I did mechanically cleaned off some of the slough currently. I think we can use the AandE ointment currently to try to keep the area moist over the scar tissue and then alginate just to catch any drainage although I think there is very minimal. I  believe this is very close to complete resolution. Integumentary (Hair, Skin) Wound #1 status is Open. Original cause of wound was Radiation Burn. The wound is located on the Right Breast (mastectomy site). The wound measures 0.6cm length x 0.5cm width x 0.1cm depth; 0.236cm^2 area and 0.024cm^3 volume. There is Fat Layer (Subcutaneous Tissue) exposed. There is no tunneling or undermining noted. There is a medium amount of serosanguineous drainage noted. The wound margin is distinct with the outline attached to the wound base. There is medium (34-66%) pink, pale granulation within the wound bed. There is a medium (34-66%) amount of necrotic tissue within the wound bed including Adherent Slough. 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 There was a Chemical/Enzymatic/Mechanical debridement performed by Zenaida Deed, RN. With the following instrument(s): gauze. Other agent used was gauze and anasept. A time out was conducted at 09:50, prior to the start of the procedure. A Minimum amount of bleeding was controlled with Pressure. The procedure was tolerated well. Post Debridement Measurements: 0.6cm length x 0.5cm width x 0.1cm depth; 0.024cm^3 volume. Character of Wound/Ulcer Post Debridement is improved. Post procedure Diagnosis Wound #1: Same as Pre-Procedure Plan Follow-up Appointments: Return appointment in 3 weeks. Bathing/ Shower/ Hygiene: May shower and wash wound with soap and water. - on days that dressing is changed WOUND #1: - Breast (mastectomy site) Wound Laterality: Right Cleanser: Soap and Water Every Other Day/30 Days Discharge  Instructions: May shower and wash wound with dial antibacterial soap and water prior to dressing change. Peri-Wound Care: AandD Ointment Every Other Day/30 Days Prim Dressing: KerraCel Ag Gelling Fiber Dressing, 2x2 in (silver alginate) (DME) (Dispense As Written) Every Other Day/30 Days ary Discharge Instructions:  Apply silver alginate to wound bed as instructed Secondary Dressing: ComfortFoam Border, 4x4 in (silicone border) (DME) (Generic) Every Other Day/30 Days Discharge Instructions: Apply over primary dressing as directed. 1. Would recommend currently that we going to continue with the wound care measures as before and the patient is in agreement with the plan this includes the use of AandE ointment followed by silver alginate dressing just to try to keep the area moist. This hopefully should allow it to heal while at the same time catching any drainage that would keep it to wet. 2. I am also can recommend currently that we have the patient continue to change this dressing every other day which I think is appropriate. We will see patient back for reevaluation in 3 weeks here in the clinic. If anything worsens or changes patient will contact our office for additional recommendations. Electronic Signature(s) Signed: 10/24/2020 10:00:17 AM By: Worthy Keeler PA-C Entered By: Worthy Keeler on 10/24/2020 10:00:16 -------------------------------------------------------------------------------- SuperBill Details Patient Name: Date of Service: [name], [place] YE 10/24/2020 Medical Record Number: [id number] Patient Account Number: [id number] Date of Birth/Sex: Treating RN: May 31, 1951 (70 y.o. [name] Primary Care Provider: HO DGES, Tamela Oddi Other Clinician: Referring Provider: Treating Provider/Extender: Worthy Keeler HO DGES, FRA [place]IS[place] Weeks in Treatment: 9 Diagnosis Coding ICD-10 Codes Code Description [phone] Non-pressure chronic ulcer of skin of other sites with fat layer  exposed L58.9 Radiodermatitis, unspecified L59.8 Other specified disorders of the skin and subcutaneous tissue related to radiation I10 Essential (primary) hypertension Facility Procedures The patient participates with Medicare or their insurance follows the Medicare Facility Guidelines: CPT4 Code Description Modifier Quantity [id number] [place] VISIT-LEV 3 EST PT 1 Physician Procedures : CPT4 Code Description Modifier [id number] - WC PHYS LEVEL 3 - EST PT ICD-10 Diagnosis Description L98.492 Non-pressure chronic ulcer of skin of other sites with fat layer exposed L58.9 Radiodermatitis, unspecified L59.8 Other specified disorders of  the skin and subcutaneous tissue related to radiation I10 Essential (primary) hypertension Quantity: 1 Electronic Signature(s) Signed: 10/24/2020 10:00:35 AM By: Worthy Keeler PA-C Entered By: Worthy Keeler on 10/24/2020 10:00:34

## 2020-10-25 NOTE — Progress Notes (Signed)
[name], [name] ([id number]) Visit Report for 10/24/2020 Arrival Information Details Patient Name: Date of Service: [name], [name] 10/24/2020 9:00 A M Medical Record Number: [id number] Patient Account Number: [id number] Date of Birth/Sex: Treating RN: 21-Nov-1950 (70 y.o. [name] Primary Care [name]: HO DGES, Tamela Oddi Other Clinician: Referring Waino Mounsey: Treating Felis Quillin/Extender: Worthy Keeler HO DGES, FRA [employer] Weeks in Treatment: 9 Visit Information History Since Last Visit Added or deleted any medications: No Patient Arrived: [name] Any new allergies or adverse reactions: No Arrival Time: 09:11 Had a fall or experienced change in No Accompanied By: alone activities of daily living that may affect Transfer Assistance: None risk of falls: Patient Identification Verified: Yes Signs or symptoms of abuse/neglect since last visito No Secondary Verification Process Completed: Yes Hospitalized since last visit: No Patient Requires Transmission-Based Precautions: No Implantable device outside of the clinic excluding No Patient Has Alerts: No cellular tissue based products placed in the center since last visit: Has Dressing in Place as Prescribed: Yes Pain Present Now: No Electronic Signature(s) Signed: 10/25/2020 5:28:50 PM By: Levan Hurst RN, BSN Entered By: Levan Hurst on 10/24/2020 09:12:12 -------------------------------------------------------------------------------- Clinic Level of Care Assessment Details Patient Name: Date of Service: [name], [name] 10/24/2020 9:00 A M Medical Record Number: [id number] Patient Account Number: [id number] Date of Birth/Sex: Treating RN: [birth year] (70 y.o. [name] Primary Care Kailash Hinze: HO DGES, FRA [employer] Other Clinician: Referring Courtne Lighty: Treating Jamesetta Greenhalgh/Extender: Worthy Keeler HO DGES, FRA [employer] Weeks in Treatment: 9 Clinic Level of Care Assessment Items TOOL 4 Quantity Score _0  - 0 Use when only an  EandM is performed on FOLLOW-UP visit ASSESSMENTS - Nursing Assessment / Reassessment X- 1 10 Reassessment of Co-morbidities (includes updates in patient status) X- 1 5 Reassessment of Adherence to Treatment Plan ASSESSMENTS - Wound and Skin A ssessment / Reassessment _1  - 0 Simple Wound Assessment / Reassessment - one wound _2  - 0 Complex Wound Assessment / Reassessment - multiple wounds _3  - 0 Dermatologic / Skin Assessment (not related to wound area) ASSESSMENTS - Focused Assessment _4  - 0 Circumferential Edema Measurements - multi extremities _5  - 0 Nutritional Assessment / Counseling / Intervention _6  - 0 Lower Extremity Assessment (monofilament, tuning fork, pulses) _7  - 0 Peripheral Arterial Disease Assessment (using hand held doppler) ASSESSMENTS - Ostomy and/or Continence Assessment and Care _8  - 0 Incontinence Assessment and Management _9  - 0 Ostomy Care Assessment and Management (repouching, etc.) PROCESS - Coordination of Care X - Simple Patient / Family Education for ongoing care 1 15 _10  - 0 Complex (extensive) Patient / Family Education for ongoing care X- 1 10 Staff obtains Programmer, systems, Records, T Results / Process Orders est _11  - 0 Staff telephones HHA, Nursing Homes / Clarify orders / etc _12  - 0 Routine Transfer to another Facility (non-emergent condition) _13  - 0 Routine Hospital Admission (non-emergent condition) _14  - 0 New Admissions / Biomedical engineer / Ordering NPWT Apligraf, etc. , _15  - 0 Emergency Hospital Admission (emergent condition) X- 1 10 Simple Discharge Coordination _16  - 0 Complex (extensive) Discharge Coordination PROCESS - Special Needs _17  - 0 Pediatric / Minor Patient Management _18  - 0 Isolation Patient Management _19  - 0 Hearing / Language / Visual special needs _20  - 0 Assessment of Community assistance (transportation, D/C planning, etc.) _21  - 0 Additional assistance / Altered mentation _22  - 0 Support Surface(s)  Assessment (bed, cushion, seat, etc.) INTERVENTIONS - Wound Cleansing / Measurement X - Simple Wound Cleansing - one wound 1  5 _0  - 0 Complex Wound Cleansing - multiple wounds X- 1 5 Wound Imaging (photographs - any number of wounds) _1  - 0 Wound Tracing (instead of photographs) X- 1 5 Simple Wound Measurement - one wound _2  - 0 Complex Wound Measurement - multiple wounds INTERVENTIONS - Wound Dressings X - Small Wound Dressing one or multiple wounds 1 10 _3  - 0 Medium Wound Dressing one or multiple wounds _4  - 0 Large Wound Dressing one or multiple wounds X- 1 5 Application of Medications - topical <[id number]>_9<\/[id number]>_2  - 0 Application of Medications - injection INTERVENTIONS - Miscellaneous _6  - 0 External ear exam _7  - 0 Specimen Collection (cultures, biopsies, blood, body fluids, etc.) _8  - 0 Specimen(s) / Culture(s) sent or taken to Lab for analysis _9  - 0 Patient Transfer (multiple staff / Civil Service fast streamer / Similar devices) _10  - 0 Simple Staple / Suture removal (25 or less) _11  - 0 Complex Staple / Suture removal (26 or more) _12  - 0 Hypo / Hyperglycemic Management (close monitor of Blood Glucose) _13  - 0 Ankle / Brachial Index (ABI) - do not check if billed separately X- 1 5 Vital Signs Has the patient been seen at the hospital within the last three years: Yes Total Score: 85 Level Of Care: New/Established - Level 3 Electronic Signature(s) Signed: 10/24/2020 5:15:42 PM By: Baruch Gouty RN, BSN Entered By: Baruch Gouty on 10/24/2020 09:50:07 -------------------------------------------------------------------------------- Multi-Disciplinary Care Plan Details Patient Name: Date of Service: [name], [name] 10/24/2020 9:00 A M Medical Record Number: [id number] Patient Account Number: [id number] Date of Birth/Sex: Treating RN: 16-Jun-1951 (70 y.o. [name] Primary Care Evva Din: HO DGES, Tamela Oddi Other Clinician: Referring Romell Cavanah: Treating Luke Rigsbee/Extender: Worthy Keeler HO DGES, FRA [employer] Weeks in Treatment: 9 Active Inactive Wound/Skin Impairment Nursing Diagnoses: Impaired tissue integrity Knowledge deficit related to ulceration/compromised skin integrity Goals: Patient/caregiver will verbalize understanding of skin care regimen Date Initiated: 08/22/2020 Target Resolution Date: 11/21/2020 Goal Status: Active Ulcer/skin breakdown will have a volume reduction of 30% by week 4 Date Initiated: 08/22/2020 Date Inactivated: 10/03/2020 Target Resolution Date: 09/19/2020 Goal Status: Met Interventions: Assess patient/caregiver ability to obtain necessary supplies Assess patient/caregiver ability to perform ulcer/skin care regimen upon admission and as needed Assess ulceration(s) every visit Treatment Activities: Skin care regimen initiated : 08/22/2020 Topical wound management initiated : 08/22/2020 Notes: Electronic Signature(s) Signed: 10/24/2020 5:15:42 PM By: Baruch Gouty RN, BSN Entered By: Baruch Gouty on 10/24/2020 09:49:18 -------------------------------------------------------------------------------- Pain Assessment Details Patient Name: Date of Service: [name], [name] 10/24/2020 9:00 A M Medical Record Number: [id number] Patient Account Number: [id number] Date of Birth/Sex: Treating RN: 05/02/1951 (70 y.o. [name] Primary Care Chip Canepa: HO DGES, Tamela Oddi Other Clinician: Referring Raylan Hanton: Treating Abisola Carrero/Extender: Worthy Keeler HO DGES, FRA [employer] Weeks in Treatment: 9 Active Problems Location of Pain Severity and Description of Pain Patient Has Paino No Site Locations Pain Management and Medication Current Pain Management: Electronic Signature(s) Signed: 10/25/2020 5:28:50 PM By: Levan Hurst RN, BSN Entered By: Levan Hurst on 10/24/2020 09:12:33 -------------------------------------------------------------------------------- Patient/Caregiver Education Details Patient Name: Date of Service: [name], [name] 1/12/2022andnbsp9:00 A M Medical Record Number: [id number] Patient Account Number: [id number] Date of Birth/Gender: Treating RN: 1951/10/05 (70 y.o. [name] Primary Care Physician: HO DGES, Tamela Oddi Other Clinician: Referring Physician: Treating Physician/Extender: Worthy Keeler HO DGES, FRA [employer] Weeks in Treatment: 9 Education Assessment Education Provided To: Patient Education Topics Provided Wound/Skin Impairment: Methods: Explain/Verbal Responses: Reinforcements needed, State content correctly  Electronic Signature(s) Signed: 10/24/2020 5:15:42 PM By: Baruch Gouty RN, BSN Entered By: Baruch Gouty on 10/24/2020 09:49:35 -------------------------------------------------------------------------------- Wound Assessment Details Patient Name: Date of Service: [name], [name] 10/24/2020 9:00 A M Medical Record Number: [id number] Patient Account Number: [id number] Date of Birth/Sex: Treating RN: 1950-10-20 (70 y.o. [name] Primary Care Shirell Struthers: HO DGES, FRA [employer] Other Clinician: Referring Delano Scardino: Treating Belynda Pagaduan/Extender: Worthy Keeler HO DGES, FRA [employer] Weeks in Treatment: 9 Wound Status Wound Number: 1 Primary Soft Tissue Radionecrosis Etiology: Wound Location: Right Breast (mastectomy site) Wound Open Wounding Event: Radiation Burn Status: Date Acquired: 07/26/2020 Comorbid Anemia, Lymphedema, Deep Vein Thrombosis, Hypertension, Weeks Of Treatment: 9 History: Peripheral Venous Disease, Osteoarthritis, Received Clustered Wound: No Chemotherapy, Received Radiation, Confinement Anxiety Wound Measurements Length: (cm) 0.6 Width: (cm) 0.5 Depth: (cm) 0.1 Area: (cm) 0.236 Volume: (cm) 0.024 % Reduction in Area: 84.6% % Reduction in Volume: 84.3% Epithelialization: Large (67-100%) Tunneling: No Undermining: No Wound Description Classification: Full Thickness Without Exposed Support Structu Wound Margin: Distinct,  outline attached Exudate Amount: Medium Exudate Type: Serosanguineous Exudate Color: red, brown res Foul Odor After Cleansing: No Slough/Fibrino Yes Wound Bed Granulation Amount: Medium (34-66%) Exposed Structure Granulation Quality: Pink, Pale Fascia Exposed: No Necrotic Amount: Medium (34-66%) Fat Layer (Subcutaneous Tissue) Exposed: Yes Necrotic Quality: Adherent Slough Tendon Exposed: No Muscle Exposed: No Joint Exposed: No Bone Exposed: No Electronic Signature(s) Signed: 10/25/2020 5:28:50 PM By: Levan Hurst RN, BSN Entered By: Levan Hurst on 10/24/2020 09:17:45 -------------------------------------------------------------------------------- Vitals Details Patient Name: Date of Service: [name], [name] 10/24/2020 9:00 A M Medical Record Number: [id number] Patient Account Number: [id number] Date of Birth/Sex: Treating RN: Apr 07, 1951 (70 y.o. [name] Primary Care Juliene Kirsh: HO DGES, FRA [employer] Other Clinician: Referring Hulon Ferron: Treating Marjani Kobel/Extender: Worthy Keeler HO DGES, FRA [employer] Weeks in Treatment: 9 Vital Signs Time Taken: 09:12 Temperature (F): 97.5 Height (in): 63 Pulse (bpm): 69 Weight (lbs): 176 Respiratory Rate (breaths/min): 18 Body Mass Index (BMI): 31.2 Blood Pressure (mmHg): 153/78 Reference Range: 80 - 120 mg / dl Electronic Signature(s) Signed: 10/25/2020 5:28:50 PM By: Levan Hurst RN, BSN Entered By: Levan Hurst on 10/24/2020 09:12:28

## 2020-11-14 ENCOUNTER — Other Ambulatory Visit: Payer: Self-pay

## 2020-11-14 ENCOUNTER — Encounter (HOSPITAL_BASED_OUTPATIENT_CLINIC_OR_DEPARTMENT_OTHER): Payer: Medicare Other | Attending: Physician Assistant | Admitting: Physician Assistant

## 2020-11-14 DIAGNOSIS — L98492 Non-pressure chronic ulcer of skin of other sites with fat layer exposed: Secondary | ICD-10-CM | POA: Diagnosis not present

## 2020-11-14 DIAGNOSIS — L598 Other specified disorders of the skin and subcutaneous tissue related to radiation: Secondary | ICD-10-CM | POA: Diagnosis not present

## 2020-11-14 DIAGNOSIS — I890 Lymphedema, not elsewhere classified: Secondary | ICD-10-CM | POA: Insufficient documentation

## 2020-11-14 DIAGNOSIS — Z86718 Personal history of other venous thrombosis and embolism: Secondary | ICD-10-CM | POA: Diagnosis not present

## 2020-11-14 DIAGNOSIS — Z9221 Personal history of antineoplastic chemotherapy: Secondary | ICD-10-CM | POA: Diagnosis not present

## 2020-11-14 NOTE — Progress Notes (Addendum)
[name], [name] ([id number]) Visit Report for 11/14/2020 Chief Complaint Document Details Patient Name: Date of Service: [name] YE 11/14/2020 9:30 A M Medical Record Number: [id number] Patient Account Number: [id number] Date of Birth/Sex: Treating RN: [birth year] (70 y.o. 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 [name] Primary Care Provider: [name] Other Clinician: Referring Provider: Treating Provider/Extender: [name], [name]: 12 History of Present Illness HPI Description: 08/22/2020 upon evaluation today patient actually appears to be doing somewhat poorly in regard to her mastectomy site on the right chest wall. She had a mastectomy initially in 1998. She had chemotherapy only no radiation following. In 2002 she subsequently did undergo radiation for the first time and then subsequently in 2012 had repeat radiation after having had a finding that was consistent with a relapse of the cancer. 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 She also has radiation damage to the skin over the right chest wall where she had the mastectomy. She was referred to [name] by Dr. Matthew [name] in [facility] and her oncologist is Dr. Burney [name].  Subsequently I want to make sure before we delve into this more deeply that the patient does not have any issues with a return of cancer that needs to be managed by them. 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 Nonetheless she does not remember exactly when her last PET scan was but it was not too recently she tells me. 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 The patient does have hypertension as well. She sees her oncologist next on December 6. 09/12/2020 on evaluation today patient actually appears to be doing excellent in regard to her wound under the right breast location. Currently this is measuring smaller in general seems to be doing very well. She tells me that the collagen is doing a good job and that the dressing that she has been putting on is not causing any troubles as far pulling on her skin or scar tissue is concerned all of which is excellent news. 10/03/2020 upon evaluation today patient appears to be doing well with regard to her surgical site from her mastectomy on the right breast region. She tells me that she has had very little bleeding nothing seems to be sticking badly and in general she has been extremely pleased with where things stand today. No fevers, chills, nausea, vomiting, or diarrhea. 10/24/2020 upon evaluation today patient actually appears to be doing excellent in regard to her wound. There is just a very small area still open and to be honest there was minimal slough noted on the surface of the wound nothing that requires sharp debridement. In general I feel like she is actually doing quite well and overall I am pleased. I do think we may switch to silver alginate dressing and  also contemplating using a AandE ointment in order to help keep everything moist and the scar tissue region while the alginate  keeps it dry enough to heal 11/14/2020 upon evaluation today patient appears to be doing well at this point in regard to her wound. I do feel like that she is making good progress again this is a very difficult region over the surgical site of the right chest wall at the site of mastectomy. Nonetheless I think that we are just a very small area still open I think alginate is doing a good job helping to dry this up and I would recommend this such that we continue with this. Electronic Signature(s) Signed: 11/14/2020 10:33:55 AM By: Worthy Keeler PA-C Entered By: Worthy Keeler on 11/14/2020 10:33:55 -------------------------------------------------------------------------------- Physical Exam Details Patient Name: Date of Service: [name], [name] 11/14/2020 9:30 A M Medical Record Number: [id number] Patient Account Number: [id number] Date of Birth/Sex: Treating RN: Jul 16, 1951 (70 y.o. [name] Primary Care Provider: [name] Other Clinician: Referring Provider: Treating Provider/Extender: [name], [name]: 84 Constitutional [name] and well-hydrated in no acute distress. Respiratory normal breathing without difficulty. Psychiatric this patient is able to make decisions and demonstrates good insight into disease process. Alert and Oriented x 3. pleasant and cooperative. Notes Upon inspection patient's wound bed actually showed signs of good granulation at this time. There does not appear to be any evidence of active infection which is great news overall extremely pleased with where things stand today. There is no sign of infection whatsoever. Electronic Signature(s) Signed: 11/14/2020 10:34:40 AM By: Worthy Keeler PA-C Entered By: Worthy Keeler on 11/14/2020 10:34:40 -------------------------------------------------------------------------------- Physician Orders Details Patient Name: Date of Service: [name], [name] 11/14/2020 9:30  A M Medical Record Number: [id number] Patient Account Number: [id number] Date of Birth/Sex: Treating RN: [birth year] (70 y.o. [name] Primary Care Provider: [name] Other Clinician: Referring Provider: Treating Provider/Extender: [name], [name]: 12 Verbal / Phone Orders: No Diagnosis Coding ICD-10 Coding Code Description [id number] Non-pressure chronic ulcer of skin of other sites with fat layer exposed L58.9 Radiodermatitis, unspecified L59.8 Other specified disorders of the skin and subcutaneous tissue related to radiation I10 Essential (primary) hypertension Follow-up Appointments Return appointment in 1 month. Bathing/ Shower/ Hygiene May shower and wash wound with soap and water. - on days that dressing is changed Wound Treatment Wound #1 - Breast (mastectomy site) Wound Laterality: Right Cleanser: Soap and Water Every Other Day/30 Days Discharge Instructions: May shower and wash wound with dial antibacterial soap and water prior to dressing change. Peri-Wound Care: AandD Ointment Every Other Day/30 Days Discharge Instructions: to scar tissue daily Prim Dressing: KerraCel Ag Gelling Fiber Dressing, 2x2 in (silver alginate) Every Other Day/30 Days ary Discharge Instructions: Apply silver alginate to wound bed as instructed Secondary Dressing: ComfortFoam Border, 4x4 in (silicone border) (DME) (Generic) Every Other Day/30 Days Discharge Instructions: Apply over primary dressing as directed. Electronic Signature(s) Signed: 11/14/2020 5:21:21 PM By: Worthy Keeler PA-C Signed: 11/14/2020 5:50:42 PM By: Baruch Gouty RN, BSN Entered By: Baruch Gouty on 11/14/2020 10:30:47 -------------------------------------------------------------------------------- Problem List Details Patient Name: Date of Service: [name], [place] YE 11/14/2020 9:30 A M Medical Record Number: [id number] Patient Account Number: [id number] Date of  Birth/Sex: Treating RN: 1951/06/17 (70 y.o. [name] Primary Care Provider: [name] Other Clinician: Referring Provider: Treating Provider/Extender: [name], [name]: 12 Active Problems  ICD-10 Encounter Code Description Active Date MDM Diagnosis L98.492 Non-pressure chronic ulcer of skin of other sites with fat layer exposed 08/22/2020 No Yes L58.9 Radiodermatitis, unspecified 08/22/2020 No Yes L59.8 Other specified disorders of the skin and subcutaneous tissue related to 08/22/2020 No Yes radiation I10 Essential (primary) hypertension 08/22/2020 No Yes Inactive Problems Resolved Problems Electronic Signature(s) Signed: 11/14/2020 9:56:47 AM By: Worthy Keeler PA-C Entered By: Worthy Keeler on 11/14/2020 09:56:47 -------------------------------------------------------------------------------- Progress Note Details Patient Name: Date of Service: [name], [name] 11/14/2020 9:30 A M Medical Record Number: [id number] Patient Account Number: [id number] Date of Birth/Sex: Treating RN: [birth year] (70 y.o. 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 She had a mastectomy initially in 1998. She had chemotherapy only no radiation following. In 2002 she subsequently did undergo radiation for the first time and then subsequently in 2012 had repeat radiation after having had a finding that was consistent with a relapse of the cancer. Subsequently the patient also has right arm lymphedema as a subsequent result  of all this. She also has radiation damage to the skin over the right chest wall where she had the mastectomy. She was referred to [name] by Dr. Matthew [name] in [facility] and her oncologist is Dr. Burney [name]. Subsequently I want to make sure before we delve into this more deeply that the patient does not have any issues with a return of cancer that needs to be managed by them. 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 Nonetheless she does not remember exactly when her last PET scan was but it was not too recently she tells me. 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 The patient does have hypertension as well. She sees her oncologist next on December 6. 09/12/2020 on evaluation today patient actually appears to be doing excellent in regard to her wound under the right breast location. Currently this is measuring smaller in general seems to be doing very well. She tells me that the collagen is doing a good job and that the dressing that she has been putting on is not causing any troubles as far pulling on her skin or scar tissue is concerned all of which is excellent news. 10/03/2020 upon evaluation today patient appears to be doing well with regard to her surgical site from her mastectomy on the right breast region. She tells me that she has had very little bleeding nothing seems to be sticking badly and in general she has been extremely pleased with where things stand today. No fevers, chills, nausea, vomiting, or diarrhea. 10/24/2020 upon evaluation today patient actually appears to be doing excellent in regard to her wound. There is just a very small area still open and to be honest there was minimal slough noted on the surface of the wound nothing that requires sharp debridement. In general I feel like she is actually doing  quite well and overall I am pleased. I do think we may switch to silver alginate dressing and also contemplating using a  AandE ointment in order to help keep everything moist and the scar tissue region while the alginate keeps it dry enough to heal 11/14/2020 upon evaluation today patient appears to be doing well at this point in regard to her wound. I do feel like that she is making good progress again this is a very difficult region over the surgical site of the right chest wall at the site of mastectomy. Nonetheless I think that we are just a very small area still open I think alginate is doing a good job helping to dry this up and I would recommend this such that we continue with this. Objective Constitutional [name] and well-hydrated in no acute distress. Vitals Time Taken: 9:53 AM, Height: 63 in, Weight: 176 lbs, BMI: 31.2, Temperature: 97.6 F, Pulse: 78 bpm, Respiratory Rate: 18 breaths/min, Blood Pressure: 160/69 mmHg. Respiratory normal breathing without difficulty. Psychiatric this patient is able to make decisions and demonstrates good insight into disease process. Alert and Oriented x 3. pleasant and cooperative. General Notes: Upon inspection patient's wound bed actually showed signs of good granulation at this time. There does not appear to be any evidence of active infection which is great news overall extremely pleased with where things stand today. There is no sign of infection whatsoever. Integumentary (Hair, Skin) Wound #1 status is Open. Original cause of wound was Radiation Burn. The wound is located on the Right Breast (mastectomy site). The wound measures 0.2cm length x 0.2cm width x 0.1cm depth; 0.031cm^2 area and 0.003cm^3 volume. There is Fat Layer (Subcutaneous Tissue) exposed. There is no tunneling or undermining noted. There is a medium amount of serosanguineous drainage noted. The wound margin is distinct with the outline attached to the wound base. There is  large (67-100%) pink, pale granulation within the wound bed. There is a small (1-33%) amount of necrotic tissue within the wound bed including Adherent Slough. Assessment Active Problems ICD-10 Non-pressure chronic ulcer of skin of other sites with fat layer exposed Radiodermatitis, unspecified Other specified disorders of the skin and subcutaneous tissue related to radiation Essential (primary) hypertension Plan Follow-up Appointments: Return appointment in 1 month. Bathing/ Shower/ Hygiene: May shower and wash wound with soap and water. - on days that dressing is changed WOUND #1: - Breast (mastectomy site) Wound Laterality: Right Cleanser: Soap and Water Every Other Day/30 Days Discharge Instructions: May shower and wash wound with dial antibacterial soap and water prior to dressing change. Peri-Wound Care: AandD Ointment Every Other Day/30 Days Discharge Instructions: to scar tissue daily Prim Dressing: KerraCel Ag Gelling Fiber Dressing, 2x2 in (silver alginate) Every Other Day/30 Days ary Discharge Instructions: Apply silver alginate to wound bed as instructed Secondary Dressing: ComfortFoam Border, 4x4 in (silicone border) (DME) (Generic) Every Other Day/30 Days Discharge Instructions: Apply over primary dressing as directed. 1. I would recommend currently that we going continue with the wound care measures as before the patient is in agreement with the plan. Fortunately I think she is doing quite nicely as far as healing is concerned here. She will continue with such to see signs of improvement. 2. I am also can recommend that we continue to have her monitor for any evidence of infection if anything at all starts to look concerning she should let me know soon as possible the sooner the better. We will see patient back for reevaluation in 1 week here in the clinic. If anything worsens or changes patient will contact our office for additional recommendations. Electronic  Signature(s) Signed: 11/14/2020 10:34:56  AM By: Worthy Keeler PA-C Entered By: Worthy Keeler on 11/14/2020 10:34:55 -------------------------------------------------------------------------------- SuperBill Details Patient Name: Date of Service: [name], [place] YE 11/14/2020 Medical Record Number: [id number] Patient Account Number: [id number] Date of Birth/Sex: Treating RN: 02-15-1951 (70 y.o. [name] Primary Care Provider: [name] Other Clinician: Referring Provider: Treating Provider/Extender: [name], [name]: 12 Diagnosis Coding ICD-10 Codes Code Description [phone] Non-pressure chronic ulcer of skin of other sites with fat layer exposed L58.9 Radiodermatitis, unspecified L59.8 Other specified disorders of the skin and subcutaneous tissue related to radiation I10 Essential (primary) hypertension Facility Procedures The patient participates with Medicare or their insurance follows the Medicare Facility Guidelines: CPT4 Code Description Modifier Quantity [id number] [place] VISIT-LEV 3 EST PT 1 Physician Procedures : CPT4 Code Description Modifier [id number] - WC PHYS LEVEL 3 - EST PT ICD-10 Diagnosis Description L98.492 Non-pressure chronic ulcer of skin of other sites with fat layer exposed L58.9 Radiodermatitis, unspecified L59.8 Other specified disorders of  the skin and subcutaneous tissue related to radiation I10 Essential (primary) hypertension Quantity: 1 Electronic Signature(s) Signed: 11/14/2020 10:35:08 AM By: Worthy Keeler PA-C Entered By: Worthy Keeler on 11/14/2020 10:35:07

## 2020-11-19 NOTE — Progress Notes (Signed)
LENNYX, VERDELL ([id number]) Visit Report for 11/14/2020 Arrival Information Details Patient Name: Date of Service: [name] 11/14/2020 9:30 A M Medical Record Number: [id number] Patient Account Number: [id number] Date of Birth/Sex: Treating RN: Jun 01, 1951 (70 y.o. [name], [name] Primary Care Dervin Vore: [name] Other Clinician: Referring Tedrick Port: Treating Johannes Everage/Extender: [name], [name]: 12 Visit Information History Since Last Visit Added or deleted any medications: No Patient Arrived: Ambulatory Any new allergies or adverse reactions: No Arrival Time: 09:52 Had a fall or experienced change in No Accompanied By: self activities of daily living that may affect Transfer Assistance: None risk of falls: Patient Identification Verified: Yes Signs or symptoms of abuse/neglect since last visito No Secondary Verification Process Completed: Yes Hospitalized since last visit: No Patient Requires Transmission-Based Precautions: No Implantable device outside of the clinic excluding No Patient Has Alerts: No cellular tissue based products placed in the center since last visit: Has Dressing in Place as Prescribed: Yes Pain Present Now: Yes Electronic Signature(s) Signed: 11/19/2020 9:06:40 AM By: Sandre Kitty Entered By: Sandre Kitty on 11/14/2020 09:53:09 -------------------------------------------------------------------------------- Clinic Level of Care Assessment Details Patient Name: Date of Service: [name], [place] YE 11/14/2020 9:30 A M Medical Record Number: [id number] Patient Account Number: [id number] Date of Birth/Sex: Treating RN: Mar 12, 1951 (70 y.o. [name] Primary Care Tiki Tucciarone: [name] Other Clinician: Referring Jamel Dunton: Treating Reeve Turnley/Extender: [name], [name]: 12 Clinic Level of Care Assessment Items TOOL 4 Quantity Score '[]'  - 0 Use when only an EandM is  performed on FOLLOW-UP visit ASSESSMENTS - Nursing Assessment / Reassessment X- 1 10 Reassessment of Co-morbidities (includes updates in patient status) X- 1 5 Reassessment of Adherence to Treatment Plan ASSESSMENTS - Wound and Skin A ssessment / Reassessment X - Simple Wound Assessment / Reassessment - one wound 1 5 '[]'  - 0 Complex Wound Assessment / Reassessment - multiple wounds X- 1 10 Dermatologic / Skin Assessment (not related to wound area) ASSESSMENTS - Focused Assessment '[]'  - 0 Circumferential Edema Measurements - multi extremities '[]'  - 0 Nutritional Assessment / Counseling / Intervention '[]'  - 0 Lower Extremity Assessment (monofilament, tuning fork, pulses) '[]'  - 0 Peripheral Arterial Disease Assessment (using hand held doppler) ASSESSMENTS - Ostomy and/or Continence Assessment and Care '[]'  - 0 Incontinence Assessment and Management '[]'  - 0 Ostomy Care Assessment and Management (repouching, etc.) PROCESS - Coordination of Care X - Simple Patient / Family Education for ongoing care 1 15 '[]'  - 0 Complex (extensive) Patient / Family Education for ongoing care X- 1 10 Staff obtains Programmer, systems, Records, T Results / Process Orders est '[]'  - 0 Staff telephones HHA, Nursing Homes / Clarify orders / etc '[]'  - 0 Routine Transfer to another Facility (non-emergent condition) '[]'  - 0 Routine Hospital Admission (non-emergent condition) '[]'  - 0 New Admissions / Biomedical engineer / Ordering NPWT Apligraf, etc. , '[]'  - 0 Emergency Hospital Admission (emergent condition) X- 1 10 Simple Discharge Coordination '[]'  - 0 Complex (extensive) Discharge Coordination PROCESS - Special Needs '[]'  - 0 Pediatric / Minor Patient Management '[]'  - 0 Isolation Patient Management '[]'  - 0 Hearing / Language / Visual special needs '[]'  - 0 Assessment of Community assistance (transportation, D/C planning, etc.) '[]'  - 0 Additional assistance / Altered mentation '[]'  - 0 Support Surface(s) Assessment  (bed, cushion, seat, etc.) INTERVENTIONS - Wound Cleansing / Measurement X - Simple Wound Cleansing - one wound 1 5 '[]'  - 0 Complex Wound Cleansing - multiple  wounds X- 1 5 Wound Imaging (photographs - any number of wounds) '[]'  - 0 Wound Tracing (instead of photographs) X- 1 5 Simple Wound Measurement - one wound '[]'  - 0 Complex Wound Measurement - multiple wounds INTERVENTIONS - Wound Dressings X - Small Wound Dressing one or multiple wounds 1 10 '[]'  - 0 Medium Wound Dressing one or multiple wounds '[]'  - 0 Large Wound Dressing one or multiple wounds X- 1 5 Application of Medications - topical '[]'  - 0 Application of Medications - injection INTERVENTIONS - Miscellaneous '[]'  - 0 External ear exam '[]'  - 0 Specimen Collection (cultures, biopsies, blood, body fluids, etc.) '[]'  - 0 Specimen(s) / Culture(s) sent or taken to Lab for analysis '[]'  - 0 Patient Transfer (multiple staff / Civil Service fast streamer / Similar devices) '[]'  - 0 Simple Staple / Suture removal (25 or less) '[]'  - 0 Complex Staple / Suture removal (26 or more) '[]'  - 0 Hypo / Hyperglycemic Management (close monitor of Blood Glucose) '[]'  - 0 Ankle / Brachial Index (ABI) - do not check if billed separately X- 1 5 Vital Signs Has the patient been seen at the hospital within the last three years: Yes Total Score: 100 Level Of Care: New/Established - Level 3 Electronic Signature(s) Signed: 11/14/2020 5:50:42 PM By: Baruch Gouty RN, BSN Entered By: Baruch Gouty on 11/14/2020 10:22:35 -------------------------------------------------------------------------------- Encounter Discharge Information Details Patient Name: Date of Service: [name], [name] 11/14/2020 9:30 A M Medical Record Number: [id number] Patient Account Number: [id number] Date of Birth/Sex: Treating RN: 10-Jul-1951 (70 y.o. [name], [name] Primary Care Ivet Guerrieri: [name] Other Clinician: Referring Chyla Schlender: Treating Ozella Comins/Extender: [name], [name]: 12 Encounter Discharge Information Items Discharge Condition: Stable Ambulatory Status: Ambulatory Discharge Destination: Home Transportation: Private Auto Accompanied By: self Schedule Follow-up Appointment: Yes Clinical Summary of Care: Patient Declined Electronic Signature(s) Signed: 11/19/2020 9:09:54 AM By: Rhae Hammock RN Entered By: Rhae Hammock on 11/14/2020 10:53:23 -------------------------------------------------------------------------------- Multi-Disciplinary Care Plan Details Patient Name: Date of Service: [name], [place] YE 11/14/2020 9:30 A M Medical Record Number: [id number] Patient Account Number: [id number] Date of Birth/Sex: Treating RN: 1951/02/26 (70 y.o. [name] Primary Care Saralee Bolick: [name] Other Clinician: Referring Odysseus Cada: Treating Yakov Bergen/Extender: [name], [name]: 12 Active Inactive Wound/Skin Impairment Nursing Diagnoses: Impaired tissue integrity Knowledge deficit related to ulceration/compromised skin integrity Goals: Patient/caregiver will verbalize understanding of skin care regimen Date Initiated: 08/22/2020 Target Resolution Date: 11/21/2020 Goal Status: Active Ulcer/skin breakdown will have a volume reduction of 30% by week 4 Date Initiated: 08/22/2020 Date Inactivated: 10/03/2020 Target Resolution Date: 09/19/2020 Goal Status: Met Interventions: Assess patient/caregiver ability to obtain necessary supplies Assess patient/caregiver ability to perform ulcer/skin care regimen upon admission and as needed Assess ulceration(s) every visit Treatment Activities: Skin care regimen initiated : 08/22/2020 Topical wound management initiated : 08/22/2020 Notes: Electronic Signature(s) Signed: 11/14/2020 5:50:42 PM By: Baruch Gouty RN, BSN Entered By: Baruch Gouty on 11/14/2020  10:21:44 -------------------------------------------------------------------------------- Pain Assessment Details Patient Name: Date of Service: [name], [place] YE 11/14/2020 9:30 A M Medical Record Number: [id number] Patient Account Number: [id number] Date of Birth/Sex: Treating RN: 1951-01-17 (70 y.o. [name] Primary Care Jesiah Yerby: [name] Other Clinician: Referring Congetta Odriscoll: Treating Jahquan Klugh/Extender: [name], [name]: 12 Active Problems Location of Pain Severity and Description of Pain Patient Has Paino Yes Site Locations Rate the pain. Current Pain Level: 4 Pain Management and Medication Current Pain Management: Electronic Signature(s) Signed:  11/14/2020 5:50:42 PM By: Baruch Gouty RN, BSN Signed: 11/19/2020 9:06:40 AM By: Sandre Kitty Entered By: Sandre Kitty on 11/14/2020 09:53:33 -------------------------------------------------------------------------------- Patient/Caregiver Education Details Patient Name: Date of Service: [name], [name] 2/2/2022andnbsp9:30 A M Medical Record Number: [id number] Patient Account Number: [id number] Date of Birth/Gender: Treating RN: Jun 15, 1951 (70 y.o. [name] Primary Care Physician: [name] Other Clinician: Referring Physician: Treating Physician/Extender: [name], [name] in Treatment: 12 Education Assessment Education Provided To: Patient Education Topics Provided Wound/Skin Impairment: Methods: Explain/Verbal Responses: Reinforcements needed, State content correctly [employer]) Signed: 11/14/2020 5:50:42 PM By: Baruch Gouty RN, BSN Entered By: Baruch Gouty on 11/14/2020 10:22:01 -------------------------------------------------------------------------------- Wound Assessment Details Patient Name: Date of Service: [name], [place] YE 11/14/2020 9:30 A M Medical Record Number: [id number] Patient Account Number:  [id number] Date of Birth/Sex: Treating RN: [birth year] (70 y.o. [name], [name] Primary Care Perfecto Purdy: [name] Other Clinician: Referring Momoka Stringfield: Treating Keats Kingry/Extender: [name], [name]: 12 Wound Status Wound Number: 1 Primary Soft Tissue Radionecrosis Etiology: Wound Location: Right Breast (mastectomy site) Wound Open Wounding Event: Radiation Burn Status: Date Acquired: 07/26/2020 Comorbid Anemia, Lymphedema, Deep Vein Thrombosis, Hypertension, Weeks Of Treatment: 12 History: Peripheral Venous Disease, Osteoarthritis, Received Clustered Wound: No Chemotherapy, Received Radiation, Confinement Anxiety Photos Photo Uploaded By: Mikeal Hawthorne on 11/16/2020 14:24:43 Wound Measurements Length: (cm) 0.2 Width: (cm) 0.2 Depth: (cm) 0.1 Area: (cm) 0.031 Volume: (cm) 0.003 % Reduction in Area: 98% % Reduction in Volume: 98% Epithelialization: Large (67-100%) Tunneling: No Undermining: No Wound Description Classification: Full Thickness Without Exposed Support Structures Wound Margin: Distinct, outline attached Exudate Amount: Medium Exudate Type: Serosanguineous Exudate Color: red, brown Foul Odor After Cleansing: No Slough/Fibrino Yes Wound Bed Granulation Amount: Large (67-100%) Exposed Structure Granulation Quality: Pink, Pale Fascia Exposed: No Necrotic Amount: Small (1-33%) Fat Layer (Subcutaneous Tissue) Exposed: Yes Necrotic Quality: Adherent Slough Tendon Exposed: No Muscle Exposed: No Joint Exposed: No Bone Exposed: No Treatment Notes Wound #1 (Breast (mastectomy site)) Wound Laterality: Right Cleanser Soap and Water Discharge Instruction: May shower and wash wound with dial antibacterial soap and water prior to dressing change. Peri-Wound Care AandD Ointment Discharge Instruction: to scar tissue daily Topical Primary Dressing KerraCel Ag Gelling Fiber Dressing, 2x2 in (silver alginate) Discharge  Instruction: Apply silver alginate to wound bed as instructed Secondary Dressing ComfortFoam Border, 4x4 in (silicone border) Discharge Instruction: Apply over primary dressing as directed. Secured With Compression Wrap Compression Stockings Environmental education officer) Signed: 11/14/2020 5:41:58 PM By: [name] Signed: 11/14/2020 5:50:42 PM By: Baruch Gouty RN, BSN Entered By: [name] on 11/14/2020 10:01:32 -------------------------------------------------------------------------------- Vitals Details Patient Name: Date of Service: [name], [place] YE 11/14/2020 9:30 A M Medical Record Number: [id number] Patient Account Number: [id number] Date of Birth/Sex: Treating RN: 1951/03/05 (70 y.o. [name] Primary Care Faythe Heitzenrater: [name] Other Clinician: Referring Namrata Dangler: Treating Argel Pablo/Extender: [name], [name]: 12 Vital Signs Time Taken: 09:53 Temperature (F): 97.6 Height (in): 63 Pulse (bpm): 78 Weight (lbs): 176 Respiratory Rate (breaths/min): 18 Body Mass Index (BMI): 31.2 Blood Pressure (mmHg): 160/69 Reference Range: 80 - 120 mg / dl Electronic Signature(s) Signed: 11/19/2020 9:06:40 AM By: Sandre Kitty Entered By: Sandre Kitty on 11/14/2020 09:53:26

## 2020-11-21 DIAGNOSIS — M19012 Primary osteoarthritis, left shoulder: Secondary | ICD-10-CM | POA: Diagnosis not present

## 2020-11-21 DIAGNOSIS — M25512 Pain in left shoulder: Secondary | ICD-10-CM | POA: Diagnosis not present

## 2020-11-21 DIAGNOSIS — M79602 Pain in left arm: Secondary | ICD-10-CM | POA: Diagnosis not present

## 2020-11-21 DIAGNOSIS — Z6833 Body mass index (BMI) 33.0-33.9, adult: Secondary | ICD-10-CM | POA: Diagnosis not present

## 2020-11-26 ENCOUNTER — Encounter: Payer: Self-pay | Admitting: Gastroenterology

## 2020-11-28 DIAGNOSIS — M25512 Pain in left shoulder: Secondary | ICD-10-CM | POA: Diagnosis not present

## 2020-11-28 DIAGNOSIS — Z6833 Body mass index (BMI) 33.0-33.9, adult: Secondary | ICD-10-CM | POA: Diagnosis not present

## 2020-12-06 DIAGNOSIS — B351 Tinea unguium: Secondary | ICD-10-CM | POA: Diagnosis not present

## 2020-12-11 DIAGNOSIS — E782 Mixed hyperlipidemia: Secondary | ICD-10-CM | POA: Diagnosis not present

## 2020-12-11 DIAGNOSIS — I10 Essential (primary) hypertension: Secondary | ICD-10-CM | POA: Diagnosis not present

## 2020-12-11 DIAGNOSIS — I700 Atherosclerosis of aorta: Secondary | ICD-10-CM | POA: Diagnosis not present

## 2020-12-12 ENCOUNTER — Encounter (HOSPITAL_BASED_OUTPATIENT_CLINIC_OR_DEPARTMENT_OTHER): Payer: Medicare Other | Attending: Physician Assistant | Admitting: Physician Assistant

## 2020-12-12 ENCOUNTER — Other Ambulatory Visit: Payer: Self-pay

## 2020-12-12 DIAGNOSIS — I890 Lymphedema, not elsewhere classified: Secondary | ICD-10-CM | POA: Diagnosis not present

## 2020-12-12 DIAGNOSIS — L98492 Non-pressure chronic ulcer of skin of other sites with fat layer exposed: Secondary | ICD-10-CM | POA: Insufficient documentation

## 2020-12-12 DIAGNOSIS — Z86718 Personal history of other venous thrombosis and embolism: Secondary | ICD-10-CM | POA: Insufficient documentation

## 2020-12-12 DIAGNOSIS — L598 Other specified disorders of the skin and subcutaneous tissue related to radiation: Secondary | ICD-10-CM | POA: Diagnosis not present

## 2020-12-12 DIAGNOSIS — Z9221 Personal history of antineoplastic chemotherapy: Secondary | ICD-10-CM | POA: Insufficient documentation

## 2020-12-12 DIAGNOSIS — M1990 Unspecified osteoarthritis, unspecified site: Secondary | ICD-10-CM | POA: Diagnosis not present

## 2020-12-13 NOTE — Progress Notes (Signed)
[name], [name] ([id number]) Visit Report for 12/12/2020 SuperBill Details Patient Name: Date of Service: [name] 12/12/2020 Medical Record Number: [id number] Patient Account Number: [id number] Date of Birth/Sex: Treating RN: Feb 16, 1951 (70 y.o. [name] Primary Care Provider: [name] Other Clinician: Referring Provider: Treating Provider/Extender: [name], [name]: 16 Diagnosis Coding ICD-10 Codes Code Description [phone] Non-pressure chronic ulcer of skin of other sites with fat layer exposed L58.9 Radiodermatitis, unspecified L59.8 Other specified disorders of the skin and subcutaneous tissue related to radiation I10 Essential (primary) hypertension Facility Procedures The patient participates with Medicare or their insurance follows the Medicare Facility Guidelines CPT4 Code Description Modifier Quantity [id number] [place] VISIT-LEV 3 EST PT 1 Electronic Signature(s) Signed: 12/12/2020 1:43:55 PM By: Baruch Gouty RN, BSN Signed: 12/13/2020 8:24:08 AM By: Worthy Keeler PA-C Entered By: Baruch Gouty on 12/12/2020 09:55:13

## 2020-12-13 NOTE — Progress Notes (Signed)
[name], [name] ([id number]) Visit Report for 12/12/2020 Arrival Information Details Patient Name: Date of Service: [name] 12/12/2020 9:30 A M Medical Record Number: [id number] Patient Account Number: [id number] Date of Birth/Sex: Treating RN: [birth year] (70 y.o. [name], [name] Primary Care Dorise Gangi: [name] Other Clinician: Referring Colin Norment: Treating Margery Szostak/Extender: [name], [name]: 16 Visit Information History Since Last Visit Added or deleted any medications: No Patient Arrived: Ambulatory Any new allergies or adverse reactions: No Arrival Time: 09:40 Had a fall or experienced change in No Accompanied By: husband activities of daily living that may affect Transfer Assistance: None risk of falls: Patient Identification Verified: Yes Signs or symptoms of abuse/neglect since last visito No Secondary Verification Process Completed: Yes Hospitalized since last visit: No Patient Requires Transmission-Based Precautions: No Implantable device outside of the clinic excluding No Patient Has Alerts: No cellular tissue based products placed in the center since last visit: Has Dressing in Place as Prescribed: Yes Pain Present Now: No Electronic Signature(s) Signed: 12/13/2020 5:30:41 PM By: Sandre Kitty Entered By: Sandre Kitty on [name]/11/2020 09:40:45 -------------------------------------------------------------------------------- Clinic Level of Care Assessment Details Patient Name: Date of Service: [name], [name] 12/12/2020 9:30 A M Medical Record Number: [id number] Patient Account Number: [id number] Date of Birth/Sex: Treating RN: [birth year] (70 y.o. [name] Primary Care Hayli Milligan: [name] Other Clinician: Referring Vianka Ertel: Treating Avis Tirone/Extender: [name], [name]: 16 Clinic Level of Care Assessment Items TOOL 4 Quantity Score []  - 0 Use when only an EandM is  performed on FOLLOW-UP visit ASSESSMENTS - Nursing Assessment / Reassessment []  - 0 Reassessment of Co-morbidities (includes updates in patient status) []  - 0 Reassessment of Adherence to Treatment Plan ASSESSMENTS - Wound and Skin A ssessment / Reassessment X - Simple Wound Assessment / Reassessment - one wound 1 5 []  - 0 Complex Wound Assessment / Reassessment - multiple wounds X- 1 10 Dermatologic / Skin Assessment (not related to wound area) ASSESSMENTS - Focused Assessment []  - 0 Circumferential Edema Measurements - multi extremities []  - 0 Nutritional Assessment / Counseling / Intervention []  - 0 Lower Extremity Assessment (monofilament, tuning fork, pulses) []  - 0 Peripheral Arterial Disease Assessment (using hand held doppler) ASSESSMENTS - Ostomy and/or Continence Assessment and Care []  - 0 Incontinence Assessment and Management []  - 0 Ostomy Care Assessment and Management (repouching, etc.) PROCESS - Coordination of Care X - Simple Patient / Family Education for ongoing care 1 15 []  - 0 Complex (extensive) Patient / Family Education for ongoing care X- 1 10 Staff obtains Programmer, systems, Records, T Results / Process Orders est []  - 0 Staff telephones HHA, Nursing Homes / Clarify orders / etc []  - 0 Routine Transfer to another Facility (non-emergent condition) []  - 0 Routine Hospital Admission (non-emergent condition) []  - 0 New Admissions / Biomedical engineer / Ordering NPWT Apligraf, etc. , []  - 0 Emergency Hospital Admission (emergent condition) X- 1 10 Simple Discharge Coordination []  - 0 Complex (extensive) Discharge Coordination PROCESS - Special Needs []  - 0 Pediatric / Minor Patient Management []  - 0 Isolation Patient Management []  - 0 Hearing / Language / Visual special needs []  - 0 Assessment of Community assistance (transportation, D/C planning, etc.) []  - 0 Additional assistance / Altered mentation []  - 0 Support Surface(s) Assessment  (bed, cushion, seat, etc.) INTERVENTIONS - Wound Cleansing / Measurement X - Simple Wound Cleansing - one wound 1 5 []  - 0 Complex Wound Cleansing - multiple  wounds X- 1 5 Wound Imaging (photographs - any number of wounds) []  - 0 Wound Tracing (instead of photographs) X- 1 5 Simple Wound Measurement - one wound []  - 0 Complex Wound Measurement - multiple wounds INTERVENTIONS - Wound Dressings X - Small Wound Dressing one or multiple wounds 1 10 []  - 0 Medium Wound Dressing one or multiple wounds []  - 0 Large Wound Dressing one or multiple wounds X- 1 5 Application of Medications - topical []  - 0 Application of Medications - injection INTERVENTIONS - Miscellaneous []  - 0 External ear exam []  - 0 Specimen Collection (cultures, biopsies, blood, body fluids, etc.) []  - 0 Specimen(s) / Culture(s) sent or taken to Lab for analysis []  - 0 Patient Transfer (multiple staff / Civil Service fast streamer / Similar devices) []  - 0 Simple Staple / Suture removal (25 or less) []  - 0 Complex Staple / Suture removal (26 or more) []  - 0 Hypo / Hyperglycemic Management (close monitor of Blood Glucose) []  - 0 Ankle / Brachial Index (ABI) - do not check if billed separately X- 1 5 Vital Signs Has the patient been seen at the hospital within the last three years: Yes Total Score: 85 Level Of Care: New/Established - Level 3 Electronic Signature(s) Signed: 12/12/2020 1:43:55 PM By: Baruch Gouty RN, BSN Entered By: Baruch Gouty on [name]/11/2020 09:53:35 -------------------------------------------------------------------------------- Encounter Discharge Information Details Patient Name: Date of Service: [name], [name] 12/12/2020 9:30 A M Medical Record Number: [id number] Patient Account Number: [id number] Date of Birth/Sex: Treating RN: [name] (69 y.o. [name] Primary Care Christpoher Sievers: [name] Other Clinician: Referring Alante Tolan: Treating Dowell Hoon/Extender: [name],  [name]: 16 Encounter Discharge Information Items Discharge Condition: Stable Ambulatory Status: Ambulatory Discharge Destination: Home Transportation: Private Auto Accompanied By: self Schedule Follow-up Appointment: Yes Clinical Summary of Care: Patient Declined Electronic Signature(s) Signed: 12/12/2020 1:43:55 PM By: Baruch Gouty RN, BSN Entered By: Baruch Gouty on [name]/11/2020 09:54:27 -------------------------------------------------------------------------------- Patient/Caregiver Education Details Patient Name: Date of Service: [name], [name] 3/2/2022andnbsp9:30 A M Medical Record Number: [id number] Patient Account Number: [id number] Date of Birth/Gender: Treating RN: [name] (70 y.o. [name] Primary Care Physician: [name] Other Clinician: Referring Physician: Treating Physician/Extender: [name], [name] in Treatment: 16 Education Assessment Education Provided To: Patient Education Topics Provided Wound/Skin Impairment: Methods: Explain/Verbal Responses: Reinforcements needed, State content correctly [employer]) Signed: 12/12/2020 1:43:55 PM By: Baruch Gouty RN, BSN Entered By: Baruch Gouty on [name]/11/2020 09:54:08 -------------------------------------------------------------------------------- Wound Assessment Details Patient Name: Date of Service: [name], [name] 12/12/2020 9:30 A M Medical Record Number: [id number] Patient Account Number: [id number] Date of Birth/Sex: Treating RN: [name] (70 y.o. [name] Primary Care Neziah Vogelgesang: [name] Other Clinician: Referring Avien Taha: Treating Perlita Forbush/Extender: [name], [name]: 16 Wound Status Wound Number: 1 Primary Etiology: Soft Tissue Radionecrosis Wound Location: Right Breast (mastectomy site) Wound Status: Open Wounding Event: Radiation Burn Date Acquired:  07/26/2020 Weeks Of Treatment: 16 Clustered Wound: No Wound Measurements Length: (cm) 0.2 Width: (cm) 0.2 Depth: (cm) 0.1 Area: (cm) 0.031 Volume: (cm) 0.003 % Reduction in Area: 98% % Reduction in Volume: 98% Wound Description Classification: Full Thickness Without Exposed Support Structur es Treatment Notes Wound #1 (Breast (mastectomy site)) Wound Laterality: Right Cleanser Soap and Water Discharge Instruction: May shower and wash wound with dial antibacterial soap and water prior to dressing change. Peri-Wound Care AandD Ointment Discharge Instruction: to scar tissue daily Topical Primary Dressing KerraCel  Ag Gelling Fiber Dressing, 2x2 in (silver alginate) Discharge Instruction: Apply silver alginate to wound bed as instructed Secondary Dressing ComfortFoam Border, 4x4 in (silicone border) Discharge Instruction: Apply over primary dressing as directed. Secured With Compression Wrap Compression Stockings Environmental education officer) Signed: 12/12/2020 1:43:55 PM By: Baruch Gouty RN, BSN Signed: 12/13/2020 5:30:41 PM By: Sandre Kitty Entered By: Sandre Kitty on [name]/11/2020 09:41:11 -------------------------------------------------------------------------------- Vitals Details Patient Name: Date of Service: [name], [name] 12/12/2020 9:30 A M Medical Record Number: [id number] Patient Account Number: [id number] Date of Birth/Sex: Treating RN: [name], [name] (70 y.o. [name] Primary Care Nimai Burbach: [name] Other Clinician: Referring Bekah Igoe: Treating Fadil Macmaster/Extender: [name], [name]: 16 Vital Signs Time Taken: 09:40 Temperature (F): 98.9 Height (in): 63 Pulse (bpm): 71 Weight (lbs): 176 Respiratory Rate (breaths/min): 18 Body Mass Index (BMI): 31.2 Blood Pressure (mmHg): 167/85 Reference Range: 80 - 120 mg / dl Electronic Signature(s) Signed: 12/13/2020 5:30:41 PM By: Sandre Kitty Entered  By: Sandre Kitty on [name]/11/2020 09:41:[name]

## 2020-12-26 ENCOUNTER — Encounter (HOSPITAL_BASED_OUTPATIENT_CLINIC_OR_DEPARTMENT_OTHER): Payer: Medicare Other | Admitting: Physician Assistant

## 2021-01-02 ENCOUNTER — Encounter (HOSPITAL_BASED_OUTPATIENT_CLINIC_OR_DEPARTMENT_OTHER): Payer: Medicare Other | Admitting: Physician Assistant

## 2021-01-02 ENCOUNTER — Other Ambulatory Visit: Payer: Self-pay

## 2021-01-02 DIAGNOSIS — I890 Lymphedema, not elsewhere classified: Secondary | ICD-10-CM | POA: Diagnosis not present

## 2021-01-02 DIAGNOSIS — L598 Other specified disorders of the skin and subcutaneous tissue related to radiation: Secondary | ICD-10-CM | POA: Diagnosis not present

## 2021-01-02 DIAGNOSIS — L98492 Non-pressure chronic ulcer of skin of other sites with fat layer exposed: Secondary | ICD-10-CM | POA: Diagnosis not present

## 2021-01-02 DIAGNOSIS — Z9221 Personal history of antineoplastic chemotherapy: Secondary | ICD-10-CM | POA: Diagnosis not present

## 2021-01-02 DIAGNOSIS — M1990 Unspecified osteoarthritis, unspecified site: Secondary | ICD-10-CM | POA: Diagnosis not present

## 2021-01-02 DIAGNOSIS — Z86718 Personal history of other venous thrombosis and embolism: Secondary | ICD-10-CM | POA: Diagnosis not present
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
## 2021-01-03 NOTE — Progress Notes (Signed)
[name], [name] ([id number]) Visit Report for 01/02/2021 Arrival Information Details Patient Name: Date of Service: [name] 01/02/2021 10:15 A M Medical Record Number: [id number] Patient Account Number: [id number] Date of Birth/Sex: Treating RN: July 28, 1951 (70 y.o. [name], [name] Primary Care [name] Pemberton: [name] Other Clinician: Referring [name]: Treating Durrel Mcnee/Extender: [name], [name]: 19 Visit Information History Since Last Visit Added or deleted any medications: No Patient Arrived: Ambulatory Any new allergies or adverse reactions: No Arrival Time: 10:20 Had a fall or experienced change in No Accompanied By: husband activities of daily living that may affect Transfer Assistance: None risk of falls: Patient Identification Verified: Yes Signs or symptoms of abuse/neglect since last visito No Secondary Verification Process Completed: Yes Hospitalized since last visit: No Patient Requires Transmission-Based Precautions: No Implantable device outside of the clinic excluding No Patient Has Alerts: No cellular tissue based products placed in the center since last visit: Has Dressing in Place as Prescribed: Yes Pain Present Now: No Electronic Signature(s) Signed: 01/03/2021 9:47:55 AM By: [name] Entered By: [name] on 01/02/2021 10:20:47 -------------------------------------------------------------------------------- Sarita Details Patient Name: Date of Service: [name], [name] 01/02/2021 10:15 A M Medical Record Number: [id number] Patient Account Number: [id number] Date of Birth/Sex: Treating RN: Apr 27, 1951 (70 y.o. [name] Primary Care Eleisha [name]: [name] Other Clinician: Referring Maille Halliwell: Treating Racquel Arkin/Extender: [name], [name] in [name]: [place] reviewed with physician Active Inactive Wound/Skin  Impairment Nursing Diagnoses: Impaired tissue integrity Knowledge deficit related to ulceration/compromised skin integrity Goals: Patient/caregiver will verbalize understanding of skin care regimen Date Initiated: 08/22/2020 Target Resolution Date: 01/30/2021 Goal Status: Active Ulcer/skin breakdown will have a volume reduction of 30% by week 4 Date Initiated: 08/22/2020 Date Inactivated: 10/03/2020 Target Resolution Date: 09/19/2020 Goal Status: Met Interventions: Assess patient/caregiver ability to obtain necessary supplies Assess patient/caregiver ability to perform ulcer/skin care regimen upon admission and as needed Assess ulceration(s) every visit [name] Activities: Skin care regimen initiated : 08/22/2020 Topical wound management initiated : 08/22/2020 Notes: Electronic Signature(s) Signed: 01/02/2021 5:47:10 PM By: Baruch Gouty RN, BSN Entered By: Baruch Gouty on 01/02/2021 11:03:58 -------------------------------------------------------------------------------- Pain Assessment Details Patient Name: Date of Service: [name], [name] 01/02/2021 10:15 A M Medical Record Number: [id number] Patient Account Number: [id number] Date of Birth/Sex: Treating RN: [birth year] (70 y.o. [name] Primary Care Taveon Enyeart: [name] Other Clinician: Referring [name]: Treating [name]/Extender: [name], [name]: 19 Active Problems Location of Pain Severity and Description of Pain Patient Has Paino No Site Locations Pain Management and Medication Current Pain Management: Electronic Signature(s) Signed: 01/02/2021 5:47:10 PM By: Baruch Gouty RN, BSN Signed: 01/03/2021 9:47:55 AM By: [name] Entered By: [name] on 01/02/2021 10:21:11 -------------------------------------------------------------------------------- Patient/Caregiver Education Details Patient Name: Date of Service: [name], [name]  3/23/2022andnbsp10:15 [place] Record Number: [id number] Patient Account Number: [id number] Date of Birth/Gender: Treating RN: 1951-03-24 (70 y.o. [name] Primary Care Physician: [name] Other Clinician: Referring Physician: Treating Physician/Extender: [name], [name] in [name]: 19 Education Assessment Education Provided To: Patient Education Topics Provided Wound/Skin Impairment: Methods: Explain/Verbal Responses: Reinforcements needed, State content correctly [employer]) Signed: 01/02/2021 5:47:10 PM By: Baruch Gouty RN, BSN Entered By: Baruch Gouty on 01/02/2021 11:04:23 -------------------------------------------------------------------------------- Wound Assessment Details Patient Name: Date of Service: [name], [name] 01/02/2021 10:15 A M Medical Record Number: [id number] Patient Account Number: [id number] Date of Birth/Sex: Treating RN: 11-Jun-1951 (70  y.o. [name], [name] Primary Care Monika Chestang: [name] Other Clinician: Referring Odessia Asleson: Treating Mia Milan/Extender: [name], [name]: 19 Wound Status Wound Number: 1 Primary Soft Tissue Radionecrosis Etiology: Wound Location: Right Breast (mastectomy site) Wound Open Wounding Event: Radiation Burn Status: Date Acquired: 07/26/2020 Comorbid Anemia, Lymphedema, Deep Vein Thrombosis, Hypertension, Weeks Of [name]: 19 History: Peripheral Venous Disease, Osteoarthritis, Received Clustered Wound: No Chemotherapy, Received Radiation, Confinement Anxiety Photos Wound Measurements Length: (cm) 0.5 Width: (cm) 1.2 Depth: (cm) 0.1 Area: (cm) 0.471 Volume: (cm) 0.047 % Reduction in Area: 69.3% % Reduction in Volume: 69.3% Epithelialization: Small (1-33%) Tunneling: No Undermining: No Wound Description Classification: Full Thickness Without Exposed Support Structures Wound Margin: Indistinct,  nonvisible Exudate Amount: Medium Exudate Type: Serous Exudate Color: amber Foul Odor After Cleansing: No Slough/Fibrino Yes Wound Bed Granulation Amount: Small (1-33%) Exposed Structure Granulation Quality: Pink, Pale Fascia Exposed: No Necrotic Amount: Large (67-100%) Fat Layer (Subcutaneous Tissue) Exposed: Yes Necrotic Quality: Adherent Slough Tendon Exposed: No Muscle Exposed: No Joint Exposed: No Bone Exposed: No Electronic Signature(s) Signed: 01/02/2021 5:47:10 PM By: Baruch Gouty RN, BSN Signed: 01/03/2021 9:47:55 AM By: [name] Entered By: [name] on 01/02/2021 16:19:08 -------------------------------------------------------------------------------- Vitals Details Patient Name: Date of Service: [name], [name] 01/02/2021 10:15 A M Medical Record Number: [id number] Patient Account Number: [id number] Date of Birth/Sex: Treating RN: June 26, 1951 (70 y.o. [name] Primary Care Daris Harkins: [name] Other Clinician: Referring Breven Guidroz: Treating Tkai Serfass/Extender: [name], [name]: 19 Vital Signs Time Taken: 10:20 Temperature (F): 97.8 Height (in): 63 Pulse (bpm): 72 Weight (lbs): 176 Respiratory Rate (breaths/min): 18 Body Mass Index (BMI): 31.2 Blood Pressure (mmHg): 159/78 Reference Range: 80 - 120 mg / dl Electronic Signature(s) Signed: 01/03/2021 9:47:55 AM By: [name] Entered By: [name] on 01/02/2021 10:21:05

## 2021-01-30 ENCOUNTER — Other Ambulatory Visit: Payer: Self-pay

## 2021-01-30 ENCOUNTER — Encounter (HOSPITAL_BASED_OUTPATIENT_CLINIC_OR_DEPARTMENT_OTHER): Payer: Medicare Other | Attending: Physician Assistant | Admitting: Physician Assistant

## 2021-01-30 DIAGNOSIS — Z5111 Encounter for antineoplastic chemotherapy: Secondary | ICD-10-CM | POA: Insufficient documentation

## 2021-01-30 DIAGNOSIS — L98492 Non-pressure chronic ulcer of skin of other sites with fat layer exposed: Secondary | ICD-10-CM | POA: Diagnosis not present

## 2021-01-30 DIAGNOSIS — L598 Other specified disorders of the skin and subcutaneous tissue related to radiation: Secondary | ICD-10-CM | POA: Diagnosis not present

## 2021-01-30 DIAGNOSIS — Z9221 Personal history of antineoplastic chemotherapy: Secondary | ICD-10-CM | POA: Insufficient documentation

## 2021-01-30 DIAGNOSIS — Z86718 Personal history of other venous thrombosis and embolism: Secondary | ICD-10-CM | POA: Insufficient documentation

## 2021-01-30 DIAGNOSIS — I890 Lymphedema, not elsewhere classified: Secondary | ICD-10-CM | POA: Diagnosis not present

## 2021-01-30 NOTE — Progress Notes (Addendum)
NIKE, SOUTHERS ([id number]) Visit Report for 01/30/2021 Chief Complaint Document Details Patient Name: Date of Service: [name] 01/30/2021 9:30 A M Medical Record Number: [id number] Patient Account Number: [id number] Date of Birth/Sex: Treating RN: [birth year] (70 y.o. 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 [name], [name] Primary Care Provider: [name] Other Clinician: Referring Provider: Treating Provider/Extender: [name], [name]: 23 Debridement Performed for Assessment: Wound #1 Right Breast (mastectomy site) Performed By: Physician Worthy Keeler, PA Debridement Type: Debridement Level of Consciousness (Pre-procedure): Awake and Alert Pre-procedure Verification/Time Out Yes - 10:33 Taken: Start Time: 10:33 Pain Control: Lidocaine 4% T opical Solution T Area Debrided (L x W): otal 1 (cm) x 1.4 (cm) = 1.4 (cm) Tissue and other material debrided: Viable, Non-Viable, Slough, Fibrin/Exudate, Slough Level: Non-Viable Tissue Debridement Description: Selective/Open Wound Instrument: Curette Bleeding: Minimum Hemostasis Achieved: Pressure End Time: 10:36 Procedural Pain: 0 Post Procedural Pain: 0 Response to Treatment: Procedure was tolerated well Level of  Consciousness (Post- Awake and Alert procedure): Post Debridement Measurements of Total Wound Length: (cm) 1 Width: (cm) 1.4 Depth: (cm) 0.1 Volume: (cm) 0.11 Character of Wound/Ulcer Post Debridement: Improved Post Procedure Diagnosis Same as Pre-procedure Electronic Signature(s) Signed: 01/30/2021 11:32:37 AM By: Worthy Keeler PA-C Signed: 01/30/2021 6:20:01 PM By: Baruch Gouty RN, BSN Entered By: Baruch Gouty on 01/30/2021 10:35:41 -------------------------------------------------------------------------------- HPI Details Patient Name: Date of Service: [name], [name] 01/30/2021 9:30 A M Medical Record Number: [id number] Patient Account Number: [id number] Date of Birth/Sex: Treating RN: 1951/01/10 (70 y.o. [name] Primary Care Provider: [name] Other Clinician: Referring Provider: Treating Provider/Extender: [name], [name]: 23 History of Present Illness HPI Description: 08/22/2020 upon evaluation today patient actually appears to be doing somewhat poorly in regard to her mastectomy site on the right chest wall. She had a mastectomy initially in 1998. She had chemotherapy only no radiation following. In 2002 she subsequently did undergo radiation for the first time and then subsequently in 2012 had repeat radiation after having had a finding that was consistent with a relapse of the cancer. 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 She also has radiation damage to the skin over the right chest wall where she had the mastectomy. She was referred to [place] by Dr. Matthew Saras in [facility] and her oncologist is Dr. Burney Gauze. Subsequently I want to make sure before we delve into this more deeply that the patient does not have any issues with a return of cancer that needs to be managed by them. Obviously she does have significant scar tissue which may be benefited  secondary to the soft tissue radionecrosis by hyperbaric oxygen therapy in the absence of any recurrence of the cancer. Nonetheless she does not remember exactly when her last PET scan was but it was not too recently she tells me. 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 The patient does  have hypertension as well. She sees her oncologist next on December 6. 09/12/2020 on evaluation today patient actually appears to be doing excellent in regard to her wound under the right breast location. Currently this is measuring smaller in general seems to be doing very well. She tells me that the collagen is doing a good job and that the dressing that she has been putting on is not causing any troubles as far pulling on her skin or scar tissue is concerned all of which is excellent news. 10/03/2020 upon evaluation today patient appears to be doing well with regard to her surgical site from her mastectomy on the right breast region. She tells me that she has had very little bleeding nothing seems to be sticking badly and in general she has been extremely pleased with where things stand today. No fevers, chills, nausea, vomiting, or diarrhea. 10/24/2020 upon evaluation today patient actually appears to be doing excellent in regard to her wound. There is just a very small area still open and to be honest there was minimal slough noted on the surface of the wound nothing that requires sharp debridement. In general I feel like she is actually doing quite well and overall I am pleased. I do think we may switch to silver alginate dressing and also contemplating using a AandE ointment in order to help keep everything moist and the scar tissue region while the alginate keeps it dry enough to heal 11/14/2020 upon evaluation today patient appears to be doing well at this point in regard to her wound. I do feel like that she is making good progress again this is a very difficult region over the surgical site of  the right chest wall at the site of mastectomy. Nonetheless I think that we are just a very small area still open I think alginate is doing a good job helping to dry this up and I would recommend this such that we continue with this. 01/02/2021 on evaluation today patient's wound actually appears to be doing decently well. There does not appear to be any signs of active infection which is great news. She does have some slight slough buildup with biofilm on the surface of the wound mainly more biofilm. Nonetheless I was able to gently remove this with saline and gauze as well as a sterile Q-tip. She tolerated that today without complication. 01/30/2021 upon evaluation today patient appears to be doing well with regard to her wound although she still has quite a bit of trouble getting this to close I think the scar tissue and radiation damage is quite significant here unfortunately. There does not appear to be any signs of active infection which is great news. No fevers, chills, nausea, vomiting, or diarrhea. Electronic Signature(s) Signed: 01/30/2021 10:41:12 AM By: Worthy Keeler PA-C Entered By: Worthy Keeler on 01/30/2021 10:41:12 -------------------------------------------------------------------------------- Physical Exam Details Patient Name: Date of Service: [name], [name] 01/30/2021 9:30 A M Medical Record Number: [id number] Patient Account Number: [id number] Date of Birth/Sex: Treating RN: January 27, 1951 (70 y.o. [name] Primary Care Provider: Other Clinician: [name] Referring Provider: Treating Provider/Extender: [name], [name]: 24 Constitutional [name] and well-hydrated in no acute distress. Respiratory normal breathing without difficulty. Psychiatric this patient is able to make decisions and demonstrates good insight into disease process. Alert and Oriented x 3. pleasant and cooperative. Notes Patient's wound bed  showed signs of good granulation and epithelization at this point. There does not appear to be  any evidence of active infection which is great news and overall very pleased in that regard. She did have some slough noted minimally I did a very gentle sharp debridement to clear away just the surface slough and biofilm. She tolerated that today without complication no significant bleeding noted. Electronic Signature(s) Signed: 01/30/2021 10:41:30 AM By: Worthy Keeler PA-C Entered By: Worthy Keeler on 01/30/2021 10:41:29 -------------------------------------------------------------------------------- Physician Orders Details Patient Name: Date of Service: [name], [name] 01/30/2021 9:30 A M Medical Record Number: [id number] Patient Account Number: [id number] Date of Birth/Sex: Treating RN: 1951/03/16 (70 y.o. [name] Primary Care Provider: [name] Other Clinician: Referring Provider: Treating Provider/Extender: [name], [name]: 23 Verbal / Phone Orders: No Diagnosis Coding ICD-10 Coding Code Description [id number] Non-pressure chronic ulcer of skin of other sites with fat layer exposed L58.9 Radiodermatitis, unspecified L59.8 Other specified disorders of the skin and subcutaneous tissue related to radiation I10 Essential (primary) hypertension Follow-up Appointments Return appointment in 1 month. Bathing/ Shower/ Hygiene May shower and wash wound with soap and water. - on days that dressing is changed Wound Treatment Wound #1 - Breast (mastectomy site) Wound Laterality: Right Cleanser: Soap and Water Every Other Day/15 Days Discharge Instructions: May shower and wash wound with dial antibacterial soap and water prior to dressing change. Peri-Wound Care: AandD Ointment Every Other Day/15 Days Discharge Instructions: to scar tissue daily Prim Dressing: PolyMem Non-Adhesive Dressing, 4x4 in (DME) (Generic) Every Other Day/15  Days ary Discharge Instructions: Apply to wound bed as instructed Secondary Dressing: Bordered Gauze, 2x3.75 in (DME) (Generic) Every Other Day/15 Days Discharge Instructions: Apply over primary dressing as directed. Electronic Signature(s) Signed: 01/30/2021 11:32:37 AM By: Worthy Keeler PA-C Signed: 01/30/2021 6:20:01 PM By: Baruch Gouty RN, BSN Entered By: Baruch Gouty on 01/30/2021 10:38:09 -------------------------------------------------------------------------------- Problem List Details Patient Name: Date of Service: [name], [name] 01/30/2021 9:30 A M Medical Record Number: [id number] Patient Account Number: [id number] Date of Birth/Sex: Treating RN: 1951-05-16 (70 y.o. [name] Primary Care Provider: [name] Other Clinician: Referring Provider: Treating Provider/Extender: [name], [name]: 23 Active Problems ICD-10 Encounter Code Description Active Date MDM Diagnosis [phone] Non-pressure chronic ulcer of skin of other sites with fat layer exposed 08/22/2020 No Yes L58.9 Radiodermatitis, unspecified 08/22/2020 No Yes L59.8 Other specified disorders of the skin and subcutaneous tissue related to 08/22/2020 No Yes radiation I10 Essential (primary) hypertension 08/22/2020 No Yes Inactive Problems Resolved Problems Electronic Signature(s) Signed: 01/30/2021 10:26:09 AM By: Worthy Keeler PA-C Entered By: Worthy Keeler on 01/30/2021 10:26:08 -------------------------------------------------------------------------------- Progress Note Details Patient Name: Date of Service: [name], [name] 01/30/2021 9:30 A M Medical Record Number: [id number] Patient Account Number: [id number] Date of Birth/Sex: Treating RN: 1951/06/30 (70 y.o. 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 She had a mastectomy initially in 1998. She had chemotherapy only no radiation following. In 2002 she subsequently did undergo radiation for the first time and then subsequently in 2012 had repeat radiation after having had a finding that was consistent with a relapse of the cancer. Subsequently the patient also has right  arm lymphedema as a subsequent result of all this. She also has radiation damage to the skin over the right chest wall where she had the mastectomy. She was referred to [place] by Dr. Matthew Saras in [facility] and her oncologist is Dr. Burney Gauze. Subsequently I want to make sure before we delve into this more deeply that the patient does not have any issues with a return of cancer that needs to be managed by them. 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 Nonetheless she does not remember exactly when her last PET scan was but it was not too recently she tells me. 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 The patient does have hypertension as well. She sees her oncologist next on December 6. 09/12/2020 on evaluation today patient actually appears to be doing excellent in regard to her wound under the right breast location. Currently this is measuring smaller in general seems to be doing very well. She tells me that the collagen is doing a good job and that the dressing that she has been putting on is not causing any troubles as far pulling on her skin or scar tissue is concerned all of which is excellent news. 10/03/2020  upon evaluation today patient appears to be doing well with regard to her surgical site from her mastectomy on the right breast region. She tells me that she has had very little bleeding nothing seems to be sticking badly and in general she has been extremely pleased with where things stand today. No fevers, chills, nausea, vomiting, or diarrhea. 10/24/2020 upon evaluation today patient actually appears to be doing excellent in regard to her wound. There is just a very small area still open and to be honest there was minimal slough noted on the surface of the wound nothing that requires sharp debridement. In general I feel like she is actually doing quite well and overall I am pleased. I do think we may switch to silver alginate dressing and also contemplating using a AandE ointment in order to help keep everything moist and the scar tissue region while the alginate keeps it dry enough to heal 11/14/2020 upon evaluation today patient appears to be doing well at this point in regard to her wound. I do feel like that she is making good progress again this is a very difficult region over the surgical site of the right chest wall at the site of mastectomy. Nonetheless I think that we are just a very small area still open I think alginate is doing a good job helping to dry this up and I would recommend this such that we continue with this. 01/02/2021 on evaluation today patient's wound actually appears to be doing decently well. There does not appear to be any signs of active infection which is great news. She does have some slight slough buildup with biofilm on the surface of the wound mainly more biofilm. Nonetheless I was able to gently remove this with saline and gauze as well as a sterile Q-tip. She tolerated that today without complication. 01/30/2021 upon evaluation today patient appears to be doing well with regard to her wound although she still has quite a bit of trouble getting this to close I  think the scar tissue and radiation damage is quite significant here unfortunately. There does not appear to be any  signs of active infection which is great news. No fevers, chills, nausea, vomiting, or diarrhea. Objective Constitutional [name] and well-hydrated in no acute distress. Vitals Time Taken: 9:39 AM, Height: 63 in, Weight: 176 lbs, BMI: 31.2, Temperature: 97.8 F, Pulse: 74 bpm, Respiratory Rate: 18 breaths/min, Blood Pressure: 163/77 mmHg. Respiratory normal breathing without difficulty. Psychiatric this patient is able to make decisions and demonstrates good insight into disease process. Alert and Oriented x 3. pleasant and cooperative. General Notes: Patient's wound bed showed signs of good granulation and epithelization at this point. There does not appear to be any evidence of active infection which is great news and overall very pleased in that regard. She did have some slough noted minimally I did a very gentle sharp debridement to clear away just the surface slough and biofilm. She tolerated that today without complication no significant bleeding noted. Integumentary (Hair, Skin) Wound #1 status is Open. Original cause of wound was Radiation Burn. The date acquired was: 07/26/2020. The wound has been in treatment 23 weeks. The wound is located on the Right Breast (mastectomy site). The wound measures 1cm length x 1.4cm width x 0.1cm depth; 1.1cm^2 area and 0.11cm^3 volume. There is Fat Layer (Subcutaneous Tissue) exposed. There is a medium amount of serous drainage noted. The wound margin is indistinct and nonvisible. There is small (1-33%) pink, pale granulation within the wound bed. There is a large (67-100%) amount of necrotic tissue within the wound bed including Adherent Slough. Assessment Active Problems ICD-10 Non-pressure chronic ulcer of skin of other sites with fat layer exposed Radiodermatitis, unspecified Other specified disorders of the skin and  subcutaneous tissue related to radiation Essential (primary) hypertension Procedures Wound #1 Pre-procedure diagnosis of Wound #1 is a Soft Tissue Radionecrosis located on the Right Breast (mastectomy site) . There was a Selective/Open Wound Non- Viable Tissue Debridement with a total area of 1.4 sq cm performed by Worthy Keeler, PA. With the following instrument(s): Curette to remove Viable and Non-Viable tissue/material. Material removed includes Slough and Fibrin/Exudate and after achieving pain control using Lidocaine 4% T opical Solution. No specimens were taken. A time out was conducted at 10:33, prior to the start of the procedure. A Minimum amount of bleeding was controlled with Pressure. The procedure was tolerated well with a pain level of 0 throughout and a pain level of 0 following the procedure. Post Debridement Measurements: 1cm length x 1.4cm width x 0.1cm depth; 0.11cm^3 volume. Character of Wound/Ulcer Post Debridement is improved. Post procedure Diagnosis Wound #1: Same as Pre-Procedure Plan Follow-up Appointments: Return appointment in 1 month. Bathing/ Shower/ Hygiene: May shower and wash wound with soap and water. - on days that dressing is changed WOUND #1: - Breast (mastectomy site) Wound Laterality: Right Cleanser: Soap and Water Every Other Day/15 Days Discharge Instructions: May shower and wash wound with dial antibacterial soap and water prior to dressing change. Peri-Wound Care: AandD Ointment Every Other Day/15 Days Discharge Instructions: to scar tissue daily Prim Dressing: PolyMem Non-Adhesive Dressing, 4x4 in (DME) (Generic) Every Other Day/15 Days ary Discharge Instructions: Apply to wound bed as instructed Secondary Dressing: Bordered Gauze, 2x3.75 in (DME) (Generic) Every Other Day/15 Days Discharge Instructions: Apply over primary dressing as directed. 1. Would recommend currently that we go and switch to a different dressing I would recommend PolyMem  regular which I think could be a good option for her to try to keep this area moist but still control the drainage she is not having time of drainage  but nonetheless this is still something to consider. 2. I am also can recommend she continue to cover with T island dressing which I think is good. elfa 3. I am also can recommend that we continue with the wound checks monthly I think that still appropriate at this point. We will see patient back for reevaluation in 4 weeks here in the clinic. If anything worsens or changes patient will contact our office for additional recommendations. Electronic Signature(s) Signed: 01/30/2021 10:42:04 AM By: Worthy Keeler PA-C Entered By: Worthy Keeler on 01/30/2021 10:42:04 -------------------------------------------------------------------------------- SuperBill Details Patient Name: Date of Service: [name], [place] YE 01/30/2021 Medical Record Number: [id number] Patient Account Number: [id number] Date of Birth/Sex: Treating RN: 1951/08/28 (70 y.o. [name] Primary Care Provider: [name] Other Clinician: Referring Provider: Treating Provider/Extender: [name], [name]: 23 Diagnosis Coding ICD-10 Codes Code Description [phone] Non-pressure chronic ulcer of skin of other sites with fat layer exposed L58.9 Radiodermatitis, unspecified L59.8 Other specified disorders of the skin and subcutaneous tissue related to radiation I10 Essential (primary) hypertension Facility Procedures The patient participates with Medicare or their insurance follows the Medicare Facility Guidelines: CPT4 Code Description Modifier Quantity [id number] - DEBRIDE WOUND 1ST 20 SQ CM OR < 1 ICD-10 Diagnosis Description L98.492 Non-pressure chronic ulcer  of skin of other sites with fat layer exposed Physician Procedures : CPT4 Code Description Modifier [id number] - WC PHYS DEBR WO ANESTH 20 SQ CM ICD-10 Diagnosis  Description L98.492 Non-pressure chronic ulcer of skin of other sites with fat layer exposed Quantity: 1 Electronic Signature(s) Signed: 01/30/2021 10:42:09 AM By: Worthy Keeler PA-C Entered By: Worthy Keeler on 01/30/2021 10:42:09

## 2021-01-30 NOTE — Progress Notes (Signed)
[name], [name] ([id number]) Visit Report for 01/30/2021 Arrival Information Details Patient Name: Date of Service: [name] 01/30/2021 9:30 A M Medical Record Number: [id number] Patient Account Number: [id number] Date of Birth/Sex: Treating RN: [birth year] (70 y.o. [name], [name] Primary Care Rowe Warman: [name] Other Clinician: Referring Ronte Parker: Treating Jeno Calleros/Extender: [name], [name]: 23 Visit Information History Since Last Visit Added or deleted any medications: No Patient Arrived: Ambulatory Any new allergies or adverse reactions: No Arrival Time: 09:38 Had a fall or experienced change in No Accompanied By: husband activities of daily living that may affect Transfer Assistance: None risk of falls: Patient Identification Verified: Yes Signs or symptoms of abuse/neglect since last visito No Secondary Verification Process Completed: Yes Hospitalized since last visit: No Patient Requires Transmission-Based Precautions: No Implantable device outside of the clinic excluding No Patient Has Alerts: No cellular tissue based products placed in the center since last visit: Has Dressing in Place as Prescribed: Yes Pain Present Now: No Electronic Signature(s) Signed: 01/30/2021 10:33:47 AM By: Sandre Kitty Entered By: Sandre Kitty on 01/30/2021 09:39:04 -------------------------------------------------------------------------------- [place] Details Patient Name: Date of Service: [name], [name] 01/30/2021 9:30 A M Medical Record Number: [id number] Patient Account Number: [id number] Date of Birth/Sex: Treating RN: [birth year] (70 y.o. [name] Primary Care Fortunata Betty: [name] Other Clinician: Referring Chante Mayson: Treating Kemberly Taves/Extender: [name], [name] in Treatment: Kay reviewed with physician Active Inactive Wound/Skin  Impairment Nursing Diagnoses: Impaired tissue integrity Knowledge deficit related to ulceration/compromised skin integrity Goals: Patient/caregiver will verbalize understanding of skin care regimen Date Initiated: 08/22/2020 Target Resolution Date: 02/27/2021 Goal Status: Active Ulcer/skin breakdown will have a volume reduction of 30% by week 4 Date Initiated: 08/22/2020 Date Inactivated: 10/03/2020 Target Resolution Date: 09/19/2020 Goal Status: Met Interventions: Assess patient/caregiver ability to obtain necessary supplies Assess patient/caregiver ability to perform ulcer/skin care regimen upon admission and as needed Assess ulceration(s) every visit Treatment Activities: Skin care regimen initiated : 08/22/2020 Topical wound management initiated : 08/22/2020 Notes: Electronic Signature(s) Signed: 01/30/2021 6:20:01 PM By: Baruch Gouty RN, BSN Entered By: Baruch Gouty on 01/30/2021 10:33:38 -------------------------------------------------------------------------------- Pain Assessment Details Patient Name: Date of Service: [name], [name] 01/30/2021 9:30 A M Medical Record Number: [id number] Patient Account Number: [id number] Date of Birth/Sex: Treating RN: 02-07-1951 (70 y.o. [name] Primary Care Rhyder Koegel: [name] Other Clinician: Referring Donnah Levert: Treating Lawana Hartzell/Extender: [name], [name]: 23 Active Problems Location of Pain Severity and Description of Pain Patient Has Paino No Site Locations Pain Management and Medication Current Pain Management: Electronic Signature(s) Signed: 01/30/2021 10:33:47 AM By: Sandre Kitty Signed: 01/30/2021 6:20:01 PM By: Baruch Gouty RN, BSN Entered By: Sandre Kitty on 01/30/2021 09:39:26 -------------------------------------------------------------------------------- Patient/Caregiver Education Details Patient Name: Date of Service: [name], [name]  4/20/2022andnbsp9:30 A M Medical Record Number: [id number] Patient Account Number: [id number] Date of Birth/Gender: Treating RN: 1951/09/05 (70 y.o. [name] Primary Care Physician: [name] Other Clinician: Referring Physician: Treating Physician/Extender: [name], [name] in Treatment: 23 Education Assessment Education Provided To: Patient Education Topics Provided Wound/Skin Impairment: Methods: Explain/Verbal Responses: Reinforcements needed, State content correctly [employer]) Signed: 01/30/2021 6:20:01 PM By: Baruch Gouty RN, BSN Entered By: Baruch Gouty on 01/30/2021 10:33:53 -------------------------------------------------------------------------------- Wound Assessment Details Patient Name: Date of Service: [name], [name] 01/30/2021 9:30 A M Medical Record Number: [id number] Patient Account Number: [id number] Date of Birth/Sex: Treating RN: 05-30-1951 (70  y.o. [name], [name] Primary Care Rowdy Guerrini: [name] Other Clinician: Referring Billiejean Schimek: Treating Azucena Dart/Extender: [name], [name]: 23 Wound Status Wound Number: 1 Primary Soft Tissue Radionecrosis Etiology: Wound Location: Right Breast (mastectomy site) Wound Open Wounding Event: Radiation Burn Status: Date Acquired: 07/26/2020 Comorbid Anemia, Lymphedema, Deep Vein Thrombosis, Hypertension, Weeks Of Treatment: 23 History: Peripheral Venous Disease, Osteoarthritis, Received Clustered Wound: No Chemotherapy, Received Radiation, Confinement Anxiety Photos Wound Measurements Length: (cm) 1 Width: (cm) 1.4 Depth: (cm) 0.1 Area: (cm) 1.1 Volume: (cm) 0.11 % Reduction in Area: 28.2% % Reduction in Volume: 28.1% Epithelialization: Small (1-33%) Wound Description Classification: Full Thickness Without Exposed Support Structures Wound Margin: Indistinct, nonvisible Exudate Amount: Medium Exudate  Type: Serous Exudate Color: amber Foul Odor After Cleansing: No Slough/Fibrino Yes Wound Bed Granulation Amount: Small (1-33%) Exposed Structure Granulation Quality: Pink, Pale Fascia Exposed: No Necrotic Amount: Large (67-100%) Fat Layer (Subcutaneous Tissue) Exposed: Yes Necrotic Quality: Adherent Slough Tendon Exposed: No Muscle Exposed: No Joint Exposed: No Bone Exposed: No Electronic Signature(s) Signed: 01/30/2021 4:38:38 PM By: Sandre Kitty Signed: 01/30/2021 6:20:01 PM By: Baruch Gouty RN, BSN Entered By: Sandre Kitty on 01/30/2021 16:20:46 -------------------------------------------------------------------------------- Vitals Details Patient Name: Date of Service: [name], [name] 01/30/2021 9:30 A M Medical Record Number: [id number] Patient Account Number: [id number] Date of Birth/Sex: Treating RN: 01/20/1951 (70 y.o. [name] Primary Care Abbegail Matuska: [name] Other Clinician: Referring Yehudit Fulginiti: Treating Kitty Cadavid/Extender: [name], [name]: 23 Vital Signs Time Taken: 09:39 Temperature (F): 97.8 Height (in): 63 Pulse (bpm): 74 Weight (lbs): 176 Respiratory Rate (breaths/min): 18 Body Mass Index (BMI): 31.2 Blood Pressure (mmHg): 163/77 Reference Range: 80 - 120 mg / dl Electronic Signature(s) Signed: 01/30/2021 10:33:47 AM By: Sandre Kitty Entered By: Sandre Kitty on 01/30/2021 09:39:20

## 2021-02-10 DIAGNOSIS — E782 Mixed hyperlipidemia: Secondary | ICD-10-CM | POA: Diagnosis not present

## 2021-02-10 DIAGNOSIS — I10 Essential (primary) hypertension: Secondary | ICD-10-CM | POA: Diagnosis not present

## 2021-02-27 ENCOUNTER — Other Ambulatory Visit: Payer: Self-pay

## 2021-02-27 ENCOUNTER — Encounter (HOSPITAL_BASED_OUTPATIENT_CLINIC_OR_DEPARTMENT_OTHER): Payer: Medicare Other | Attending: Physician Assistant | Admitting: Physician Assistant

## 2021-02-27 DIAGNOSIS — L98492 Non-pressure chronic ulcer of skin of other sites with fat layer exposed: Secondary | ICD-10-CM | POA: Insufficient documentation

## 2021-02-27 DIAGNOSIS — L598 Other specified disorders of the skin and subcutaneous tissue related to radiation: Secondary | ICD-10-CM | POA: Diagnosis not present

## 2021-02-27 DIAGNOSIS — Z86718 Personal history of other venous thrombosis and embolism: Secondary | ICD-10-CM | POA: Insufficient documentation

## 2021-02-27 DIAGNOSIS — I890 Lymphedema, not elsewhere classified: Secondary | ICD-10-CM | POA: Insufficient documentation

## 2021-02-27 DIAGNOSIS — Z9221 Personal history of antineoplastic chemotherapy: Secondary | ICD-10-CM | POA: Diagnosis not present
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
## 2021-02-28 NOTE — Progress Notes (Signed)
RENI, HAUSNER ([id number]) Visit Report for 02/27/2021 Arrival Information Details Patient Name: Date of Service: [name] 02/27/2021 9:30 A M Medical Record Number: [id number] Patient Account Number: [id number] Date of Birth/Sex: Treating RN: 1951/04/03 (70 y.o. [name], [name] Primary Care Delma Drone: [name] Other Clinician: Referring Kenson Groh: Treating Penne Rosenstock/Extender: [name], [name]: 27 Visit Information History Since Last Visit Added or deleted any medications: No Patient Arrived: Ambulatory Any new allergies or adverse reactions: No Arrival Time: 10:04 Had a fall or experienced change in No Accompanied By: husband activities of daily living that may affect Transfer Assistance: None risk of falls: Patient Identification Verified: Yes Signs or symptoms of abuse/neglect since last visito No Secondary Verification Process Completed: Yes Hospitalized since last visit: No Patient Requires Transmission-Based Precautions: No Implantable device outside of the clinic excluding No Patient Has Alerts: No cellular tissue based products placed in the center since last visit: Has Dressing in Place as Prescribed: Yes Pain Present Now: No Electronic Signature(s) Signed: 02/27/2021 11:07:31 AM By: Sandre Kitty Entered By: Sandre Kitty on 02/27/2021 10:04:31 -------------------------------------------------------------------------------- Encounter Discharge Information Details Patient Name: Date of Service: [name], [place] YE 02/27/2021 9:30 A M Medical Record Number: [id number] Patient Account Number: [id number] Date of Birth/Sex: Treating RN: [birth year] (70 y.o. [name] Primary Care Lockie Bothun: [name] Other Clinician: Referring Wael Maestas: Treating Madisin Hasan/Extender: [name], [name]: 27 Encounter Discharge Information Items Post Procedure Vitals Discharge Condition:  Stable Temperature (F): 97.8 Ambulatory Status: Ambulatory Pulse (bpm): 83 Discharge Destination: Home Respiratory Rate (breaths/min): 16 Transportation: Private Auto Blood Pressure (mmHg): 183/73 Accompanied By: alone Schedule Follow-up Appointment: Yes Clinical Summary of Care: Patient Declined Electronic Signature(s) Signed: 02/27/2021 6:30:22 PM By: Levan Hurst RN, BSN Entered By: Levan Hurst on 02/27/2021 10:53:41 -------------------------------------------------------------------------------- [place] Details Patient Name: Date of Service: [name], [name] 02/27/2021 9:30 A M Medical Record Number: [id number] Patient Account Number: [id number] Date of Birth/Sex: Treating RN: June 21, 1951 (70 y.o. [name] Primary Care Arcadio Cope: [name] Other Clinician: Referring Aralyn Nowak: Treating Delanda Bulluck/Extender: [name], [name] in Treatment: [place] reviewed with physician Active Inactive Wound/Skin Impairment Nursing Diagnoses: Impaired tissue integrity Knowledge deficit related to ulceration/compromised skin integrity Goals: Patient/caregiver will verbalize understanding of skin care regimen Date Initiated: 08/22/2020 Target Resolution Date: 03/29/2021 Goal Status: Active Ulcer/skin breakdown will have a volume reduction of 30% by week 4 Date Initiated: 08/22/2020 Date Inactivated: 10/03/2020 Target Resolution Date: 09/19/2020 Goal Status: Met Interventions: Assess patient/caregiver ability to obtain necessary supplies Assess patient/caregiver ability to perform ulcer/skin care regimen upon admission and as needed Assess ulceration(s) every visit Treatment Activities: Skin care regimen initiated : 08/22/2020 Topical wound management initiated : 08/22/2020 Notes: Electronic Signature(s) Signed: 02/27/2021 6:30:22 PM By: Levan Hurst RN, BSN Entered By: Levan Hurst on 02/27/2021  10:41:26 -------------------------------------------------------------------------------- Pain Assessment Details Patient Name: Date of Service: [name], [name] 02/27/2021 9:30 A M Medical Record Number: [id number] Patient Account Number: [id number] Date of Birth/Sex: Treating RN: 07-27-1951 (70 y.o. [name] Primary Care Toneka Fullen: [name] Other Clinician: Referring Lonnel Gjerde: Treating Quadir Muns/Extender: [name], [name]: 27 Active Problems Location of Pain Severity and Description of Pain Patient Has Paino No Site Locations Pain Management and Medication Current Pain Management: Electronic Signature(s) Signed: 02/27/2021 11:07:31 AM By: Sandre Kitty Signed: 02/27/2021 6:10:17 PM By: Baruch Gouty RN, BSN Entered By: Sandre Kitty on 02/27/2021 10:05:00 -------------------------------------------------------------------------------- Patient/Caregiver Education  Details Patient Name: Date of Service: [name], [place] YE 5/18/2022andnbsp9:30 A M Medical Record Number: [id number] Patient Account Number: [id number] Date of Birth/Gender: Treating RN: 1951-08-12 (70 y.o. [name] Primary Care Physician: [name] Other Clinician: Referring Physician: Treating Physician/Extender: [name], [name] in Treatment: 27 Education Assessment Education Provided To: Patient Education Topics Provided Wound/Skin Impairment: Methods: Explain/Verbal Responses: State content correctly [employer]) Signed: 02/27/2021 6:30:22 PM By: Levan Hurst RN, BSN Entered By: Levan Hurst on 02/27/2021 10:41:37 -------------------------------------------------------------------------------- Wound Assessment Details Patient Name: Date of Service: [name], [place] YE 02/27/2021 9:30 A M Medical Record Number: [id number] Patient Account Number: [id number] Date of Birth/Sex: Treating RN: 11/11/1950 (70  y.o. [name], [name] Primary Care Dejanae Helser: [name] Other Clinician: Referring Kathaleen Dudziak: Treating Shaneece Stockburger/Extender: [name], [name]: 27 Wound Status Wound Number: 1 Primary Soft Tissue Radionecrosis Etiology: Wound Location: Right Breast (mastectomy site) Wound Open Wounding Event: Radiation Burn Status: Date Acquired: 07/26/2020 Comorbid Anemia, Lymphedema, Deep Vein Thrombosis, Hypertension, Weeks Of Treatment: 27 History: Peripheral Venous Disease, Osteoarthritis, Received Clustered Wound: No Chemotherapy, Received Radiation, Confinement Anxiety Photos Wound Measurements Length: (cm) 0.8 Width: (cm) 1.5 Depth: (cm) 0.1 Area: (cm) 0.942 Volume: (cm) 0.094 % Reduction in Area: 38.5% % Reduction in Volume: 38.6% Epithelialization: Small (1-33%) Wound Description Classification: Full Thickness Without Exposed Support Structures Wound Margin: Indistinct, nonvisible Exudate Amount: Medium Exudate Type: Serous Exudate Color: amber Foul Odor After Cleansing: No Slough/Fibrino Yes Wound Bed Granulation Amount: Small (1-33%) Exposed Structure Granulation Quality: Pink, Pale Fascia Exposed: No Necrotic Amount: Large (67-100%) Fat Layer (Subcutaneous Tissue) Exposed: Yes Necrotic Quality: Adherent Slough Tendon Exposed: No Muscle Exposed: No Joint Exposed: No Bone Exposed: No Treatment Notes Wound #1 (Breast (mastectomy site)) Wound Laterality: Right Cleanser Soap and Water Discharge Instruction: May shower and wash wound with dial antibacterial soap and water prior to dressing change. Peri-Wound Care AandD Ointment Discharge Instruction: to scar tissue daily Topical Primary Dressing PolyMem Non-Adhesive Dressing, 4x4 in Discharge Instruction: Apply to wound bed as instructed Secondary Dressing Bordered Gauze, 4x4 in Discharge Instruction: Apply over primary dressing as directed. Secured With Compression  Wrap Compression Stockings Environmental education officer) Signed: 02/27/2021 6:10:17 PM By: Baruch Gouty RN, BSN Signed: 02/28/2021 8:07:53 AM By: Sandre Kitty Entered By: Sandre Kitty on 02/27/2021 16:57:34 -------------------------------------------------------------------------------- Vitals Details Patient Name: Date of Service: [name], [name] 02/27/2021 9:30 A M Medical Record Number: [id number] Patient Account Number: [id number] Date of Birth/Sex: Treating RN: 10-05-1951 (70 y.o. [name] Primary Care Khadijatou Borak: [name] Other Clinician: Referring Iviana Blasingame: Treating Dajae Kizer/Extender: [name], [name]: 27 Vital Signs Time Taken: 10:04 Temperature (F): 97.8 Height (in): 63 Pulse (bpm): 83 Weight (lbs): 176 Respiratory Rate (breaths/min): 18 Body Mass Index (BMI): 31.2 Blood Pressure (mmHg): 183/73 Reference Range: 80 - 120 mg / dl Electronic Signature(s) Signed: 02/27/2021 11:07:31 AM By: Sandre Kitty Entered By: Sandre Kitty on 02/27/2021 10:04:49

## 2021-03-07 DIAGNOSIS — L602 Onychogryphosis: Secondary | ICD-10-CM | POA: Diagnosis not present

## 2021-03-15 ENCOUNTER — Other Ambulatory Visit: Payer: Self-pay | Admitting: Hematology & Oncology

## 2021-03-18 ENCOUNTER — Encounter: Payer: Self-pay | Admitting: Hematology & Oncology

## 2021-03-18 ENCOUNTER — Inpatient Hospital Stay (HOSPITAL_BASED_OUTPATIENT_CLINIC_OR_DEPARTMENT_OTHER): Payer: Medicare Other | Admitting: Hematology & Oncology

## 2021-03-18 ENCOUNTER — Telehealth: Payer: Self-pay | Admitting: *Deleted

## 2021-03-18 ENCOUNTER — Inpatient Hospital Stay: Payer: Medicare Other | Attending: Hematology & Oncology

## 2021-03-18 VITALS — BP 171/56 | HR 85 | Temp 97.6°F | Resp 18 | Wt 189.0 lb

## 2021-03-18 DIAGNOSIS — C50911 Malignant neoplasm of unspecified site of right female breast: Secondary | ICD-10-CM | POA: Insufficient documentation

## 2021-03-18 DIAGNOSIS — Z7982 Long term (current) use of aspirin: Secondary | ICD-10-CM | POA: Diagnosis not present

## 2021-03-18 DIAGNOSIS — C50011 Malignant neoplasm of nipple and areola, right female breast: Secondary | ICD-10-CM | POA: Diagnosis not present

## 2021-03-18 DIAGNOSIS — D500 Iron deficiency anemia secondary to blood loss (chronic): Secondary | ICD-10-CM | POA: Diagnosis not present

## 2021-03-18 DIAGNOSIS — Z79811 Long term (current) use of aromatase inhibitors: Secondary | ICD-10-CM | POA: Insufficient documentation

## 2021-03-18 DIAGNOSIS — M7989 Other specified soft tissue disorders: Secondary | ICD-10-CM | POA: Diagnosis not present

## 2021-03-18 DIAGNOSIS — M2550 Pain in unspecified joint: Secondary | ICD-10-CM | POA: Insufficient documentation

## 2021-03-18 DIAGNOSIS — Z923 Personal history of irradiation: Secondary | ICD-10-CM | POA: Diagnosis not present

## 2021-03-18 LAB — LACTATE DEHYDROGENASE: LDH: 173 U/L (ref 98–192)

## 2021-03-18 LAB — CBC WITH DIFFERENTIAL (CANCER CENTER ONLY)
Abs Immature Granulocytes: 0.03 10*3/uL (ref 0.00–0.07)
Basophils Absolute: 0.1 10*3/uL (ref 0.0–0.1)
Basophils Relative: 1 %
Eosinophils Absolute: 0.3 10*3/uL (ref 0.0–0.5)
Eosinophils Relative: 3 %
HCT: 33.3 % — ABNORMAL LOW (ref 36.0–46.0)
Hemoglobin: 10.7 g/dL — ABNORMAL LOW (ref 12.0–15.0)
Immature Granulocytes: 0 %
Lymphocytes Relative: 26 %
Lymphs Abs: 1.9 10*3/uL (ref 0.7–4.0)
MCH: 29.2 pg (ref 26.0–34.0)
MCHC: 32.1 g/dL (ref 30.0–36.0)
MCV: 90.7 fL (ref 80.0–100.0)
Monocytes Absolute: 0.6 10*3/uL (ref 0.1–1.0)
Monocytes Relative: 8 %
Neutro Abs: 4.6 10*3/uL (ref 1.7–7.7)
Neutrophils Relative %: 62 %
Platelet Count: 198 10*3/uL (ref 150–400)
RBC: 3.67 MIL/uL — ABNORMAL LOW (ref 3.87–5.11)
RDW: 13.2 % (ref 11.5–15.5)
WBC Count: 7.4 10*3/uL (ref 4.0–10.5)
nRBC: 0 % (ref 0.0–0.2)

## 2021-03-18 LAB — RETICULOCYTES
Immature Retic Fract: 12.6 % (ref 2.3–15.9)
RBC.: 3.67 MIL/uL — ABNORMAL LOW (ref 3.87–5.11)
Retic Count, Absolute: 62.8 10*3/uL (ref 19.0–186.0)
Retic Ct Pct: 1.7 % (ref 0.4–3.1)

## 2021-03-18 LAB — CMP (CANCER CENTER ONLY)
ALT: 10 U/L (ref 0–44)
AST: 12 U/L — ABNORMAL LOW (ref 15–41)
Albumin: 4.1 g/dL (ref 3.5–5.0)
Alkaline Phosphatase: 63 U/L (ref 38–126)
Anion gap: 9 (ref 5–15)
BUN: 20 mg/dL (ref 8–23)
CO2: 29 mmol/L (ref 22–32)
Calcium: 9.6 mg/dL (ref 8.9–10.3)
Chloride: 105 mmol/L (ref 98–111)
Creatinine: 0.67 mg/dL (ref 0.44–1.00)
GFR, Estimated: >60 mL/min (ref >60)
Glucose, Bld: 100 mg/dL — ABNORMAL HIGH (ref 70–99)
Potassium: 3.7 mmol/L (ref 3.5–5.1)
Sodium: 143 mmol/L (ref 135–145)
Total Bilirubin: 0.4 mg/dL (ref 0.3–1.2)
Total Protein: 7.1 g/dL (ref 6.5–8.1)

## 2021-03-18 LAB — IRON AND TIBC
Iron: 49 ug/dL (ref 41–142)
Saturation Ratios: 22 % (ref 21–57)
TIBC: 226 ug/dL — ABNORMAL LOW (ref 236–444)
UIBC: 177 ug/dL (ref 120–384)

## 2021-03-18 LAB — FERRITIN: Ferritin: 572 ng/mL — ABNORMAL HIGH (ref 11–307)

## 2021-03-18 NOTE — Telephone Encounter (Signed)
Per 03/18/21 los - gave patient upcoming appointment - gave calendar

## 2021-03-18 NOTE — Progress Notes (Signed)
Hematology and Oncology Follow Up Visit  [name] [id number] 07-25-1951 70 y.o. 03/18/2021   Principle Diagnosis:  Locally recurrent adenocarcinoma of the right breast History of superficial venous thrombus of the right thigh  Current Therapy:        1. Aromasin 25 mg p.o. daily - on hold start on 03/18/2021 2. Aspirin 81 mg p.o. daily   Interim History:  [name] is here today for follow-up.  Her main problem has been this arthralgia that she has.  She has joint pain in most of her joints.  This really started in the wintertime.  There has been no change in her medications.  I am not sure as to why she would have this.  I know she has been on Aromasin for a long time.  We can certainly hold this for a few weeks to see if this might help her out a little bit.  I am not sure as to why she has this arthralgias overall.  There is no joint swelling or redness.  There is a little bit of joint tenderness when she moves, particular shoulders..  She still has a dressing on the right anterior chest wall where she has a radiation telangiectasias.  She has had no fever.  There is been no issues with COVID.  There is no change in bowel or bladder habits.  She has had no cough or shortness of breath.  Is been a little bit of leg swelling.  This is chronic.  Overall, her performance status is ECOG 1.     Medications:  Allergies as of 03/18/2021      Reactions   Contrast Media [iodinated Diagnostic Agents] Hives, Shortness Of Breath, Nausea And Vomiting   Allergic reaction even after pre-meds.   Metrizamide Hives, Shortness Of Breath, Nausea And Vomiting   Allergic reaction even after pre-meds.   Other Shortness Of Breath, Nausea And Vomiting, Rash   Allergic reaction even after pre-meds.   Brilliant Blue Fcf Hives, Nausea Only   Dye Fdc Blue [brilliant Blue Fcf (fd&c Blue #1)] Hives, Nausea Only      Medication List       Accurate as of March 18, 2021 11:07 AM. If you have any  questions, ask your nurse or doctor.        amLODipine 10 MG tablet Commonly known as: NORVASC Take 10 mg by mouth daily.   aspirin EC 81 MG tablet Take 81 mg by mouth daily.   candesartan 32 MG tablet Commonly known as: ATACAND Take 32 mg by mouth daily.   exemestane 25 MG tablet Commonly known as: AROMASIN TAKE 1 TABLET BY MOUTH EVERY DAY   hydrochlorothiazide 25 MG tablet Commonly known as: HYDRODIURIL Take 25 mg by mouth daily.   ibuprofen 200 MG tablet Commonly known as: ADVIL Take 200 mg by mouth every 6 (six) hours as needed.   meloxicam 7.5 MG tablet Commonly known as: MOBIC Take 7.5 mg by mouth daily as needed.   metoprolol succinate 50 MG 24 hr tablet Commonly known as: TOPROL-XL Take 50 mg by mouth daily.   simvastatin 20 MG tablet Commonly known as: ZOCOR Take 20 mg by mouth daily.   Telmisartan-amLODIPine 80-5 MG Tabs Take 80 mg by mouth daily.   Vitamin D (Ergocalciferol) 1.25 MG (50000 UNIT) Caps capsule Commonly known as: DRISDOL TAKE 1 CAPSULE BY MOUTH ONE TIME PER WEEK       Allergies:  Allergies  Allergen Reactions  . Contrast Media [Iodinated  Diagnostic Agents] Hives, Shortness Of Breath and Nausea And Vomiting    Allergic reaction even after pre-meds.  . Metrizamide Hives, Shortness Of Breath and Nausea And Vomiting    Allergic reaction even after pre-meds.  . Other Shortness Of Breath, Nausea And Vomiting and Rash    Allergic reaction even after pre-meds.  [place] Blue Fcf Hives and Nausea Only  . Dye Fdc Blue [Brilliant Blue Fcf (Fd&C Blue #1)] Hives and Nausea Only    Past Medical History, Surgical history, Social history, and Family History were reviewed and updated.  Review of Systems: Review of Systems  Constitutional: Negative.   HENT: Positive for hearing loss.   Eyes: Positive for discharge.  Respiratory: Negative.   Cardiovascular: Negative.   Gastrointestinal: Negative.   Genitourinary: Negative.    Musculoskeletal: Negative.   Skin: Negative.   Neurological: Negative.   Endo/Heme/Allergies: Negative.   Psychiatric/Behavioral: Negative.      Physical Exam:  weight is 85.7 kg. Her oral temperature is 97.6 F (36.4 C). Her blood pressure is 171/56 (abnormal) and her pulse is 85. Her respiration is 18 and oxygen saturation is 100%.   Wt Readings from Last 3 Encounters:  03/18/21 85.7 kg  09/17/20 78 kg  05/17/20 80.7 kg   Physical Exam Vitals reviewed.  Constitutional:      Comments: On her breast exam, her left breast is without any masses, edema or erythema.  There is no left axillary adenopathy.  There is no discharge from the nipple on the left breast.  Right chest wall shows radiation dermatitis which is about the same.  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  There is no obvious exudate.  There is no tenderness.  There is no right axillary adenopathy.  HENT:     Head: Normocephalic and atraumatic.  Eyes:     Pupils: Pupils are equal, round, and reactive to light.     Comments: Her ocular exam does show this stye on the upper eyelid of the left eye.  There is an area of purulent material that looks like he might be coming out soon.  She has good extraocular muscle movement.  Pupils react appropriately.  Cardiovascular:     Rate and Rhythm: Normal rate and regular rhythm.     Heart sounds: Normal heart sounds.  Pulmonary:     Effort: Pulmonary effort is normal.     Breath sounds: Normal breath sounds.  Abdominal:     General: Bowel sounds are normal.     Palpations: Abdomen is soft.  Musculoskeletal:        General: No tenderness or deformity. Normal range of motion.     Cervical back: Normal range of motion.  Lymphadenopathy:     Cervical: No cervical adenopathy.  Skin:    General: Skin is warm and dry.     Findings: No erythema or rash.  Neurological:     Mental Status: She is alert and oriented to person, place, and time.  Psychiatric:         Behavior: Behavior normal.        Thought Content: Thought content normal.        Judgment: Judgment normal.       Lab Results  Component Value Date   WBC 7.4 03/18/2021   HGB 10.7 (L) 03/18/2021   HCT 33.3 (L) 03/18/2021   MCV 90.7 03/18/2021   PLT 198 03/18/2021   Lab Results  Component Value Date   FERRITIN  549 (H) 09/17/2020   IRON 62 09/17/2020   TIBC 243 09/17/2020   UIBC 181 09/17/2020   IRONPCTSAT 25 09/17/2020   Lab Results  Component Value Date   RETICCTPCT 1.7 03/18/2021   RBC 3.67 (L) 03/18/2021   RBC 3.67 (L) 03/18/2021   RETICCTABS 55.2 06/20/2011   No results found for: KPAFRELGTCHN, LAMBDASER, KAPLAMBRATIO No results found for: Kandis Cocking [facility] Lab Results  Component Value Date   TOTALPROTELP 6.6 08/14/2010   ALBUMINELP 53.8 (L) 08/14/2010   A1GS 5.3 (H) 08/14/2010   A2GS 14.3 (H) 08/14/2010   BETS 5.8 08/14/2010   BETA2SER 6.3 08/14/2010   GAMS 14.5 08/14/2010   MSPIKE NOT DET 08/14/2010   SPEI * 08/14/2010     Chemistry      Component Value Date/Time   NA 143 03/18/2021 0928   NA 146 (H) 07/23/2017 0900   NA 144 02/18/2017 0901   K 3.7 03/18/2021 0928   K 3.7 07/23/2017 0900   K 3.7 02/18/2017 0901   CL 105 03/18/2021 0928   CL 107 07/23/2017 0900   CO2 29 03/18/2021 0928   CO2 31 07/23/2017 0900   CO2 27 02/18/2017 0901   BUN 20 03/18/2021 0928   BUN 16 07/23/2017 0900   BUN 17.7 02/18/2017 0901   CREATININE 0.67 03/18/2021 0928   CREATININE 0.8 07/23/2017 0900   CREATININE 0.7 02/18/2017 0901      Component Value Date/Time   CALCIUM 9.6 03/18/2021 0928   CALCIUM 9.3 07/23/2017 0900   CALCIUM 9.3 02/18/2017 0901   ALKPHOS 63 03/18/2021 0928   ALKPHOS 67 07/23/2017 0900   ALKPHOS 78 02/18/2017 0901   AST 12 (L) 03/18/2021 0928   AST 13 02/18/2017 0901   ALT 10 03/18/2021 0928   ALT 20 07/23/2017 0900   ALT 12 02/18/2017 0901   BILITOT 0.4 03/18/2021 0928   BILITOT 0.54 02/18/2017 0901       Impression  and Plan: Ms. Lehnert is a very pleasant 70 yo caucasian female with history of locally recurrent adenocarcinoma of the right breast with resection and radiation.    I am very unhappy about her arthralgias.  I know this is affecting her quality of life quite a bit.  For right now, we will just stop the Aromasin for 6 weeks.  I do not see any problem with [place] stopping this for 6 weeks.  If this does not work, then she may have to go to orthopedic surgery.  I do not know if this is something rheumatological.  We will plan to get her back in 6 weeks.     [name], MD 6/6/202211:07 AM

## 2021-03-26 DIAGNOSIS — R7301 Impaired fasting glucose: Secondary | ICD-10-CM | POA: Diagnosis not present

## 2021-03-26 DIAGNOSIS — I10 Essential (primary) hypertension: Secondary | ICD-10-CM | POA: Diagnosis not present

## 2021-03-26 DIAGNOSIS — E782 Mixed hyperlipidemia: Secondary | ICD-10-CM | POA: Diagnosis not present

## 2021-03-27 ENCOUNTER — Encounter (HOSPITAL_BASED_OUTPATIENT_CLINIC_OR_DEPARTMENT_OTHER): Payer: Medicare Other | Attending: Physician Assistant | Admitting: Physician Assistant

## 2021-03-27 ENCOUNTER — Other Ambulatory Visit: Payer: Self-pay

## 2021-03-27 DIAGNOSIS — L598 Other specified disorders of the skin and subcutaneous tissue related to radiation: Secondary | ICD-10-CM | POA: Diagnosis not present

## 2021-03-27 DIAGNOSIS — L98492 Non-pressure chronic ulcer of skin of other sites with fat layer exposed: Secondary | ICD-10-CM | POA: Insufficient documentation

## 2021-03-27 DIAGNOSIS — Z86718 Personal history of other venous thrombosis and embolism: Secondary | ICD-10-CM | POA: Insufficient documentation

## 2021-03-27 DIAGNOSIS — I890 Lymphedema, not elsewhere classified: Secondary | ICD-10-CM | POA: Diagnosis not present

## 2021-03-27 NOTE — Progress Notes (Addendum)
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 Other specified disorders  of the skin and subcutaneous tissue related to radiation I10 Essential (primary) hypertension Quantity: 1 : [id number] - WC PHYS SUBQ TISS 20 SQ CM ICD-10 Diagnosis Description L98.492 Non-pressure chronic ulcer of skin of other sites with fat layer exposed Quantity: 1 Electronic Signature(s) Signed: 03/27/2021 10:12:12 AM By: Worthy Keeler PA-C Entered By: Worthy Keeler on 03/27/2021 10:12:12

## 2021-03-27 NOTE — Progress Notes (Addendum)
[name], [name] ([id number]) Visit Report for 03/27/2021 Arrival Information Details Patient Name: Date of Service: [name] 03/27/2021 9:30 A M Medical Record Number: [id number] Patient Account Number: [id number] Date of Birth/Sex: Treating RN: [birth year] (70 y.o. [name], [name] Primary Care [name]: [name] Other Clinician: Referring Macaria Bias: Treating [name]/Extender: [name], [name]: 10 Visit Information History Since Last Visit Added or deleted any medications: No Patient Arrived: Ambulatory Any new allergies or adverse reactions: No Arrival Time: 09:31 Had a fall or experienced change in No Accompanied By: self activities of daily living that may affect Transfer Assistance: None risk of falls: Patient Identification Verified: Yes Signs or symptoms of abuse/neglect since last visito No Secondary Verification Process Completed: Yes Hospitalized since last visit: No Patient Requires Transmission-Based Precautions: No Implantable device outside of the clinic excluding No Patient Has Alerts: No cellular tissue based products placed in the center since last visit: Has Dressing in Place as Prescribed: Yes Pain Present Now: No Electronic Signature(s) Signed: 03/27/2021 6:22:58 PM By: Rhae Hammock RN Entered By: Rhae Hammock on 03/27/2021 09:32:13 -------------------------------------------------------------------------------- Encounter Discharge Information Details Patient Name: Date of Service: [name], [name] 03/27/2021 9:30 A M Medical Record Number: [id number] Patient Account Number: [id number] Date of Birth/Sex: Treating RN: Oct 23, 1950 (70 y.o. [name], [name] Primary Care Maxxon Schwanke: [name] Other Clinician: Referring Merian Wroe: Treating Khadija Thier/Extender: [name], [name]: 31 Encounter Discharge Information Items Post Procedure Vitals Discharge Condition:  Stable Temperature (F): 97.7 Ambulatory Status: Ambulatory Pulse (bpm): 74 Discharge Destination: Home Respiratory Rate (breaths/min): 17 Transportation: Private Auto Blood Pressure (mmHg): 107/74 Accompanied By: self Schedule Follow-up Appointment: Yes Clinical Summary of Care: Patient Declined Electronic Signature(s) Signed: 03/27/2021 6:22:58 PM By: Rhae Hammock RN Entered By: Rhae Hammock on 03/27/2021 10:22:45 -------------------------------------------------------------------------------- Lower Extremity Assessment Details Patient Name: Date of Service: [name], [name] 03/27/2021 9:30 A M Medical Record Number: [id number] Patient Account Number: [id number] Date of Birth/Sex: Treating RN: 1951-10-05 (70 y.o. [name], [name] Primary Care Nancyjo Givhan: [name] Other Clinician: Referring Parminder Trapani: Treating Jenasis Straley/Extender: [name], [name]: 31 Electronic Signature(s) Signed: 03/27/2021 6:22:58 PM By: Rhae Hammock RN Entered By: Rhae Hammock on 03/27/2021 09:33:02 -------------------------------------------------------------------------------- [place] Details Patient Name: Date of Service: [name], [name] 03/27/2021 9:30 A M Medical Record Number: [id number] Patient Account Number: [id number] Date of Birth/Sex: Treating RN: 11/05/1950 (70 y.o. [name] Primary Care Norrine Ballester: [name] Other Clinician: Referring Icesis Renn: Treating Vikrant Pryce/Extender: [name], [name] in [name]: [place] reviewed with physician Active Inactive Wound/Skin Impairment Nursing Diagnoses: Impaired tissue integrity Knowledge deficit related to ulceration/compromised skin integrity Goals: Patient/caregiver will verbalize understanding of skin care regimen Date Initiated: 08/22/2020 Target Resolution Date: 04/24/2021 Goal Status:  Active Ulcer/skin breakdown will have a volume reduction of 30% by week 4 Date Initiated: 08/22/2020 Date Inactivated: 10/03/2020 Target Resolution Date: 09/19/2020 Goal Status: Met Interventions: Assess patient/caregiver ability to obtain necessary supplies Assess patient/caregiver ability to perform ulcer/skin care regimen upon admission and as needed Assess ulceration(s) every visit [name] Activities: Skin care regimen initiated : 08/22/2020 Topical wound management initiated : 08/22/2020 Notes: Electronic Signature(s) Signed: 03/27/2021 6:34:46 PM By: Baruch Gouty RN, BSN Entered By: Baruch Gouty on 03/27/2021 10:06:17 -------------------------------------------------------------------------------- Pain Assessment Details Patient Name: Date of Service: [name], [name] 03/27/2021 9:30 A M Medical Record Number: [id number] Patient Account Number: [id number] Date of Birth/Sex: Treating RN: 1951/06/09 (70 y.o.  [name], [name] Primary Care Jordy Verba: [name] Other Clinician: Referring Bayli Quesinberry: Treating Wayden Schwertner/Extender: [name], [name] in [name]: 31 Active Problems Location of Pain Severity and Description of Pain Patient Has Paino No Site Locations Pain Management and Medication Current Pain Management: Electronic Signature(s) Signed: 03/27/2021 6:22:58 PM By: Rhae Hammock RN Entered By: Rhae Hammock on 03/27/2021 09:32:40 -------------------------------------------------------------------------------- Wound Assessment Details Patient Name: Date of Service: [name], [name] 03/27/2021 9:30 A M Medical Record Number: [id number] Patient Account Number: [id number] Date of Birth/Sex: Treating RN: [birth year] (70 y.o. [name], [name] Primary Care Jillien Yakel: [name] Other Clinician: Referring Tyleah Loh: Treating Jhase Creppel/Extender: [name], [name]: 31 Wound Status Wound  Number: 1 Primary Soft Tissue Radionecrosis Etiology: Wound Location: Right Breast (mastectomy site) Wound Open Wounding Event: Radiation Burn Status: Date Acquired: 07/26/2020 Comorbid Anemia, Lymphedema, Deep Vein Thrombosis, Hypertension, Weeks Of [name]: 31 History: Peripheral Venous Disease, Osteoarthritis, Received Clustered Wound: No Chemotherapy, Received Radiation, Confinement Anxiety Photos Wound Measurements Length: (cm) 0.7 Width: (cm) 1.9 Depth: (cm) 0.1 Area: (cm) 1.045 Volume: (cm) 0.104 % Reduction in Area: 31.8% % Reduction in Volume: 32% Epithelialization: Small (1-33%) Tunneling: No Undermining: No Wound Description Classification: Full Thickness Without Exposed Support Structures Wound Margin: Flat and Intact Exudate Amount: Medium Exudate Type: Serous Exudate Color: amber Foul Odor After Cleansing: No Slough/Fibrino Yes Wound Bed Granulation Amount: None Present (0%) Exposed Structure Necrotic Amount: Large (67-100%) Fascia Exposed: No Necrotic Quality: Adherent Slough Fat Layer (Subcutaneous Tissue) Exposed: Yes Tendon Exposed: No Muscle Exposed: No Joint Exposed: No Bone Exposed: No [name] Notes Wound #1 (Breast (mastectomy site)) Wound Laterality: Right Cleanser Soap and Water Discharge Instruction: May shower and wash wound with dial antibacterial soap and water prior to dressing change. Peri-Wound Care AandD Ointment Discharge Instruction: to scar tissue daily Topical Primary Dressing Santyl Ointment Discharge Instruction: Apply nickel thick amount to wound bed as instructed Secondary Dressing Bordered Gauze, 2x3.75 in Discharge Instruction: Apply over primary dressing as directed. Secured With Compression Wrap Compression Stockings Environmental education officer) Signed: 03/28/2021 10:52:16 AM By: Sandre Kitty Signed: 03/28/2021 5:31:32 PM By: Baruch Gouty RN, BSN Previous Signature: 03/27/2021 6:34:46 PM Version  By: Baruch Gouty RN, BSN Entered By: Sandre Kitty on 03/28/2021 10:51:08 -------------------------------------------------------------------------------- Vitals Details Patient Name: Date of Service: [name], [name] 03/27/2021 9:30 A M Medical Record Number: [id number] Patient Account Number: [id number] Date of Birth/Sex: Treating RN: 12-20-1950 (70 y.o. [name], [name] Primary Care Rafael Salway: [name] Other Clinician: Referring Prince Couey: Treating Yaretzy Olazabal/Extender: [name], [name]: 31 Vital Signs Time Taken: 09:32 Temperature (F): 97.7 Height (in): 63 Pulse (bpm): 84 Weight (lbs): 176 Respiratory Rate (breaths/min): 17 Body Mass Index (BMI): 31.2 Blood Pressure (mmHg): 145/75 Reference Range: 80 - 120 mg / dl Electronic Signature(s) Signed: 03/27/2021 6:22:58 PM By: Rhae Hammock RN Entered By: Rhae Hammock on 03/27/2021 09:32:35

## 2021-04-03 DIAGNOSIS — Z1339 Encounter for screening examination for other mental health and behavioral disorders: Secondary | ICD-10-CM | POA: Diagnosis not present

## 2021-04-03 DIAGNOSIS — Z0000 Encounter for general adult medical examination without abnormal findings: Secondary | ICD-10-CM | POA: Diagnosis not present

## 2021-04-03 DIAGNOSIS — Z136 Encounter for screening for cardiovascular disorders: Secondary | ICD-10-CM | POA: Diagnosis not present

## 2021-04-03 DIAGNOSIS — Z139 Encounter for screening, unspecified: Secondary | ICD-10-CM | POA: Diagnosis not present

## 2021-04-03 DIAGNOSIS — E782 Mixed hyperlipidemia: Secondary | ICD-10-CM | POA: Diagnosis not present

## 2021-04-03 DIAGNOSIS — I10 Essential (primary) hypertension: Secondary | ICD-10-CM | POA: Diagnosis not present

## 2021-04-03 DIAGNOSIS — M2550 Pain in unspecified joint: Secondary | ICD-10-CM | POA: Diagnosis not present

## 2021-04-03 DIAGNOSIS — R7303 Prediabetes: Secondary | ICD-10-CM | POA: Diagnosis not present

## 2021-04-03 DIAGNOSIS — Z1331 Encounter for screening for depression: Secondary | ICD-10-CM | POA: Diagnosis not present

## 2021-04-24 ENCOUNTER — Encounter (HOSPITAL_BASED_OUTPATIENT_CLINIC_OR_DEPARTMENT_OTHER): Payer: Medicare Other | Admitting: Physician Assistant

## 2021-04-24 IMAGING — MG DIGITAL SCREENING UNILAT LEFT W/ TOMO W/ CAD
6 series · 6 of 18 positions shown · non-contrast
Comparison: Previous exam(s).

CLINICAL DATA: Screening.

EXAM:
DIGITAL SCREENING UNILATERAL LEFT MAMMOGRAM WITH CAD AND TOMO

[L CC synth-2D (1 of 2)]
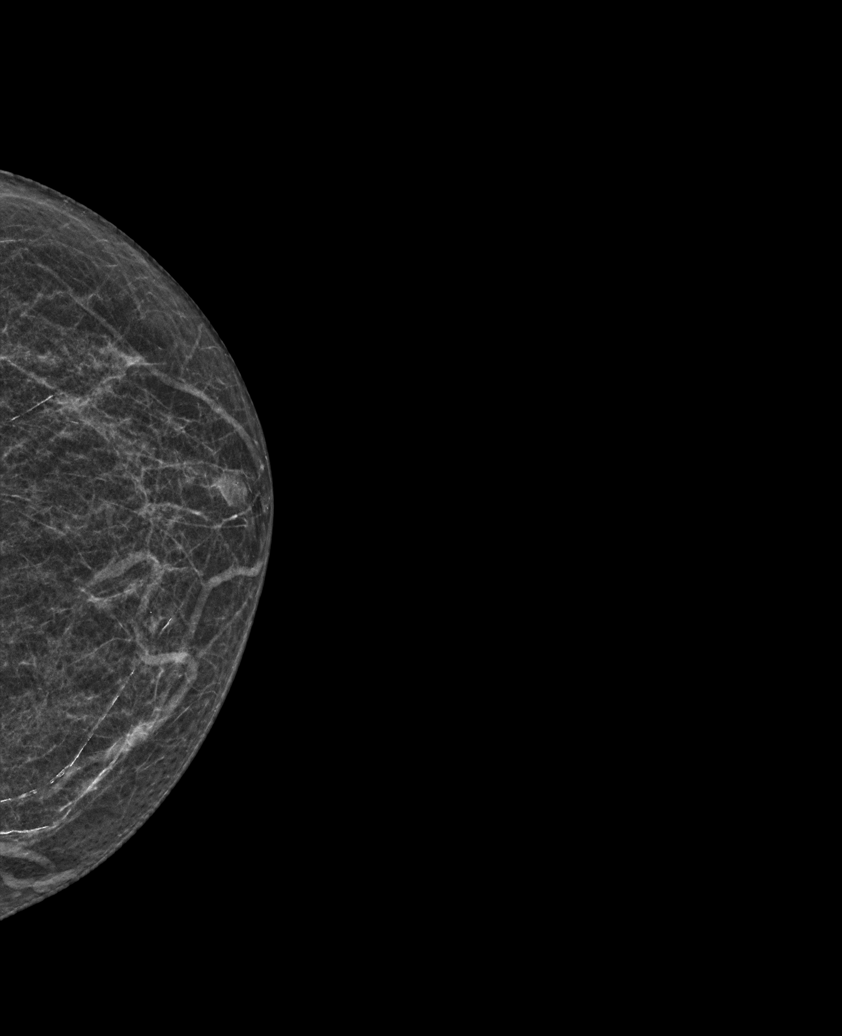

[L MLO synth-2D]
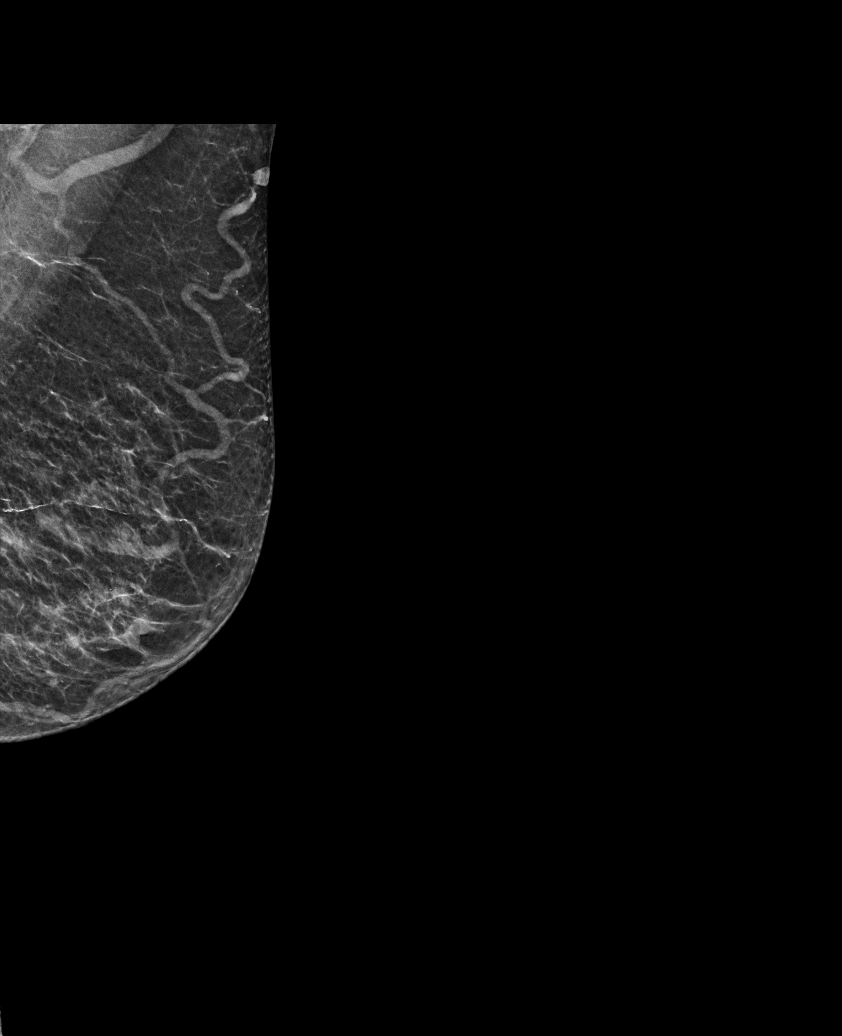

[L CC synth-2D (2 of 2)]
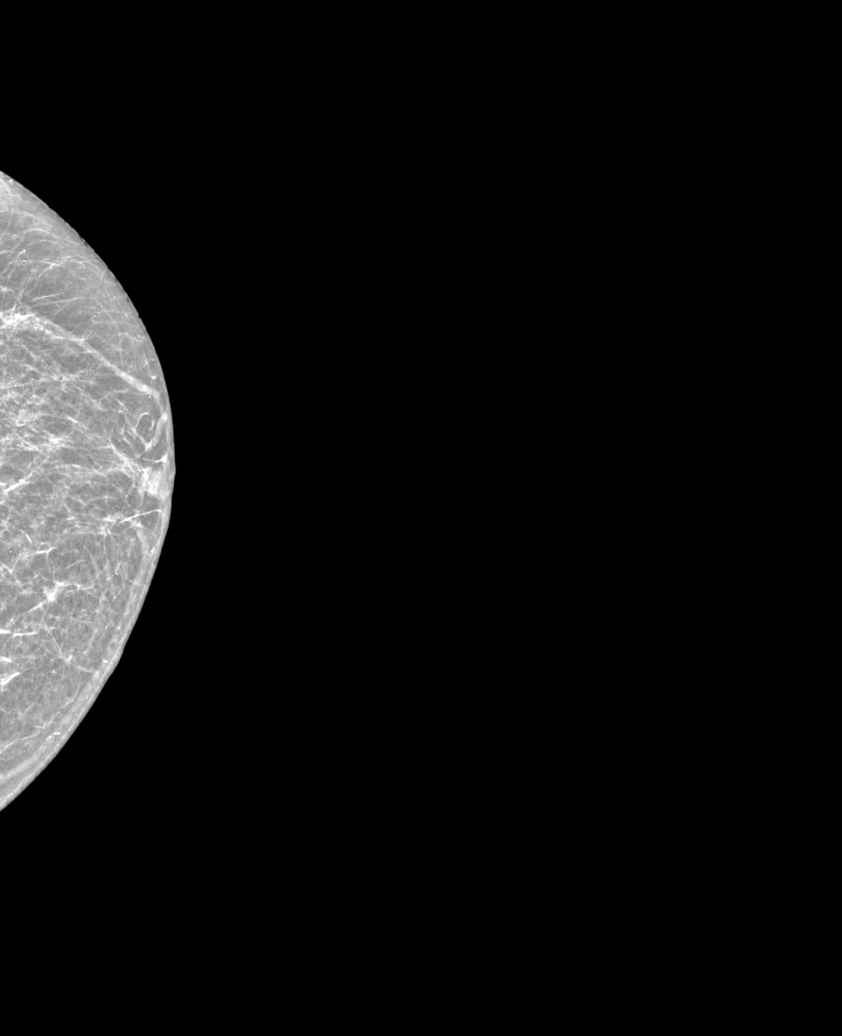

[L CC tomo (1 of 2) · tomo slice 22/43.0]
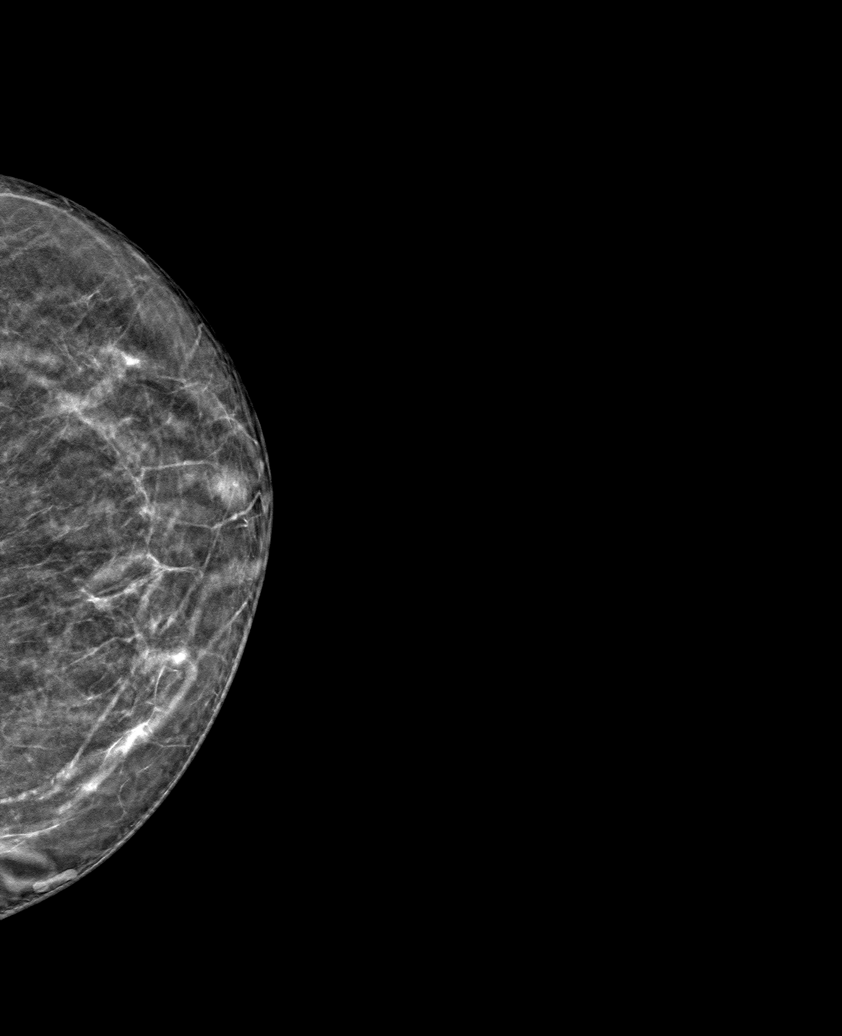

[L CC tomo (2 of 2) · tomo slice 21/41.0]
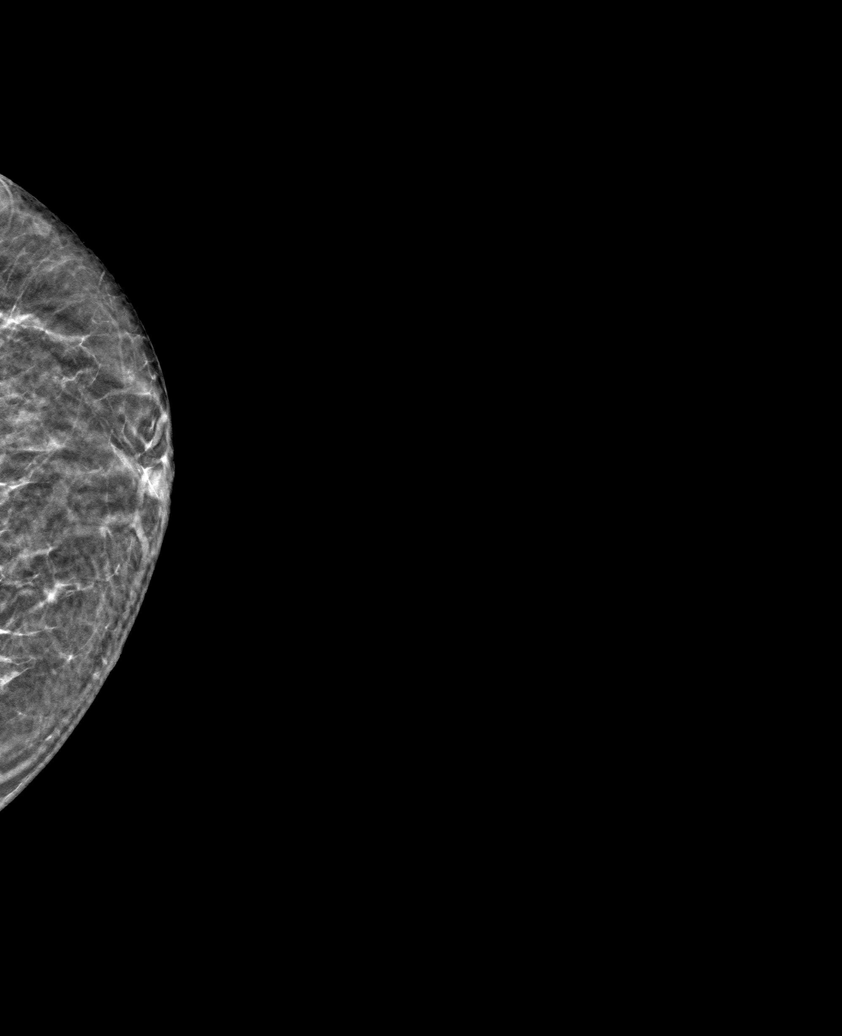

[L MLO tomo · tomo slice 25/49.0]
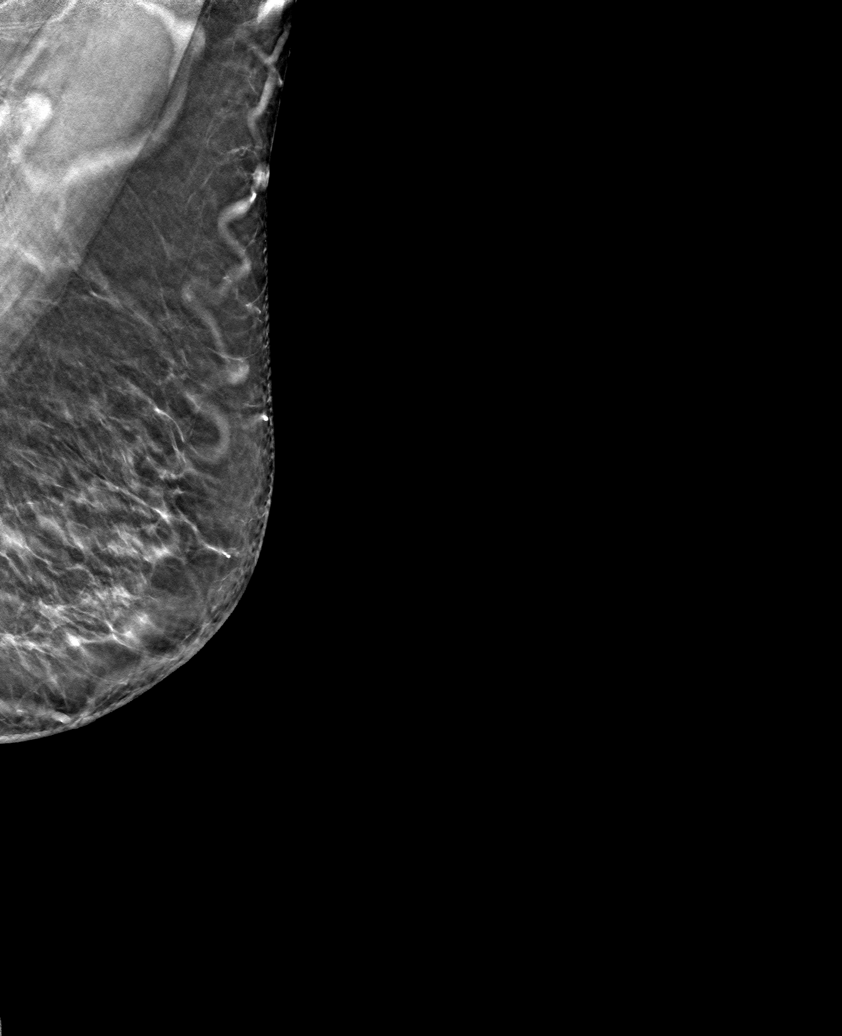

[6 of 18 positions shown; findings below may reference images not displayed]

ACR Breast Density Category b: There are scattered areas of
fibroglandular density.
FINDINGS: There are no findings suspicious for malignancy. Images were
processed with CAD.
IMPRESSION: No mammographic evidence of malignancy. A result letter of this
screening mammogram will be mailed directly to the patient.

RECOMMENDATION:
Screening mammogram in one year. (Code:[id number])

BI-RADS CATEGORY  1: Negative.

## 2021-04-29 ENCOUNTER — Inpatient Hospital Stay (HOSPITAL_BASED_OUTPATIENT_CLINIC_OR_DEPARTMENT_OTHER): Payer: Medicare Other | Admitting: Hematology & Oncology

## 2021-04-29 ENCOUNTER — Other Ambulatory Visit: Payer: Self-pay

## 2021-04-29 ENCOUNTER — Telehealth: Payer: Self-pay

## 2021-04-29 ENCOUNTER — Encounter: Payer: Self-pay | Admitting: Hematology & Oncology

## 2021-04-29 ENCOUNTER — Inpatient Hospital Stay: Payer: Medicare Other | Attending: Hematology & Oncology

## 2021-04-29 VITALS — BP 149/67 | HR 84 | Temp 98.7°F | Resp 20 | Wt 185.0 lb

## 2021-04-29 DIAGNOSIS — Z79811 Long term (current) use of aromatase inhibitors: Secondary | ICD-10-CM | POA: Insufficient documentation

## 2021-04-29 DIAGNOSIS — E039 Hypothyroidism, unspecified: Secondary | ICD-10-CM | POA: Diagnosis not present

## 2021-04-29 DIAGNOSIS — Z7982 Long term (current) use of aspirin: Secondary | ICD-10-CM | POA: Insufficient documentation

## 2021-04-29 DIAGNOSIS — D500 Iron deficiency anemia secondary to blood loss (chronic): Secondary | ICD-10-CM

## 2021-04-29 DIAGNOSIS — C50911 Malignant neoplasm of unspecified site of right female breast: Secondary | ICD-10-CM | POA: Diagnosis not present

## 2021-04-29 DIAGNOSIS — C50011 Malignant neoplasm of nipple and areola, right female breast: Secondary | ICD-10-CM | POA: Diagnosis not present

## 2021-04-29 DIAGNOSIS — Z923 Personal history of irradiation: Secondary | ICD-10-CM | POA: Insufficient documentation

## 2021-04-29 DIAGNOSIS — M7989 Other specified soft tissue disorders: Secondary | ICD-10-CM | POA: Diagnosis not present

## 2021-04-29 DIAGNOSIS — M818 Other osteoporosis without current pathological fracture: Secondary | ICD-10-CM

## 2021-04-29 DIAGNOSIS — M2550 Pain in unspecified joint: Secondary | ICD-10-CM | POA: Insufficient documentation

## 2021-04-29 LAB — CBC WITH DIFFERENTIAL (CANCER CENTER ONLY)
Abs Immature Granulocytes: 0.03 10*3/uL (ref 0.00–0.07)
Basophils Absolute: 0 10*3/uL (ref 0.0–0.1)
Basophils Relative: 0 %
Eosinophils Absolute: 0.2 10*3/uL (ref 0.0–0.5)
Eosinophils Relative: 3 %
HCT: 32.6 % — ABNORMAL LOW (ref 36.0–46.0)
Hemoglobin: 10.5 g/dL — ABNORMAL LOW (ref 12.0–15.0)
Immature Granulocytes: 0 %
Lymphocytes Relative: 25 %
Lymphs Abs: 1.8 10*3/uL (ref 0.7–4.0)
MCH: 29.1 pg (ref 26.0–34.0)
MCHC: 32.2 g/dL (ref 30.0–36.0)
MCV: 90.3 fL (ref 80.0–100.0)
Monocytes Absolute: 0.6 10*3/uL (ref 0.1–1.0)
Monocytes Relative: 8 %
Neutro Abs: 4.4 10*3/uL (ref 1.7–7.7)
Neutrophils Relative %: 64 %
Platelet Count: 230 10*3/uL (ref 150–400)
RBC: 3.61 MIL/uL — ABNORMAL LOW (ref 3.87–5.11)
RDW: 13.2 % (ref 11.5–15.5)
WBC Count: 6.9 10*3/uL (ref 4.0–10.5)
nRBC: 0 % (ref 0.0–0.2)

## 2021-04-29 LAB — CMP (CANCER CENTER ONLY)
ALT: 9 U/L (ref 0–44)
AST: 11 U/L — ABNORMAL LOW (ref 15–41)
Albumin: 3.9 g/dL (ref 3.5–5.0)
Alkaline Phosphatase: 60 U/L (ref 38–126)
Anion gap: 9 (ref 5–15)
BUN: 14 mg/dL (ref 8–23)
CO2: 29 mmol/L (ref 22–32)
Calcium: 9.5 mg/dL (ref 8.9–10.3)
Chloride: 105 mmol/L (ref 98–111)
Creatinine: 0.68 mg/dL (ref 0.44–1.00)
GFR, Estimated: >60 mL/min (ref >60)
Glucose, Bld: 107 mg/dL — ABNORMAL HIGH (ref 70–99)
Potassium: 3.8 mmol/L (ref 3.5–5.1)
Sodium: 143 mmol/L (ref 135–145)
Total Bilirubin: 0.4 mg/dL (ref 0.3–1.2)
Total Protein: 7.3 g/dL (ref 6.5–8.1)

## 2021-04-29 LAB — RETICULOCYTES
Immature Retic Fract: 10.7 % (ref 2.3–15.9)
RBC.: 3.63 MIL/uL — ABNORMAL LOW (ref 3.87–5.11)
Retic Count, Absolute: 56.3 10*3/uL (ref 19.0–186.0)
Retic Ct Pct: 1.6 % (ref 0.4–3.1)

## 2021-04-29 LAB — FERRITIN: Ferritin: 514 ng/mL — ABNORMAL HIGH (ref 11–307)

## 2021-04-29 LAB — LACTATE DEHYDROGENASE: LDH: 162 U/L (ref 98–192)

## 2021-04-29 LAB — IRON AND TIBC
Iron: 36 ug/dL — ABNORMAL LOW (ref 41–142)
Saturation Ratios: 18 % — ABNORMAL LOW (ref 21–57)
TIBC: 204 ug/dL — ABNORMAL LOW (ref 236–444)
UIBC: 168 ug/dL (ref 120–384)

## 2021-04-29 MED ORDER — METHYLPREDNISOLONE 4 MG PO TBPK
ORAL_TABLET | ORAL | 1 refills | Status: DC
Start: 2021-04-29 — End: 2022-01-02

## 2021-04-29 NOTE — Progress Notes (Signed)
Hematology and Oncology Follow Up Visit  [name] [id number] April 04, 1951 70 y.o. 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 Aromasin 25 mg p.o. daily - on hold start on 03/18/2021 2. Aspirin 81 mg p.o. daily   Interim History:  [name] is here today for follow-up.  Her main problem continues to be arthralgias.  We had her stop the Aromasin about a month or so ago.  The arthralgias are no better.  She has a lot of problems with her shoulders and neck.  Also, her hands bother her.  She had a lot of swelling in her right knee.  It clearly sounds like she has something rheumatologic.  She was also taken off her statin drug.  She sees her family doctor this Friday.  I will put her on a Medrol Dosepak.  We will see if this may not be able to help her out.  I do not think this is anything related to breast cancer.  I do not think we have to do any scans on her.  Overall, she has had no other problems.  She just is frustrated not be able to do much because of the arthralgias and pain.  There is been no problems with bowels or bladder.  She has had no cough.  There is been no exposure to COVID.  Overall, performance status is ECOG 1.      Medications:  Allergies as of 04/29/2021       Reactions   Contrast Media [iodinated Diagnostic Agents] Hives, Shortness Of Breath, Nausea And Vomiting   Allergic reaction even after pre-meds.   Metrizamide Hives, Shortness Of Breath, Nausea And Vomiting   Allergic reaction even after pre-meds.   Other Shortness Of Breath, Nausea And Vomiting, Rash   Allergic reaction even after pre-meds.   Brilliant Blue Fcf Hives, Nausea Only   Dye Fdc Blue [brilliant Blue Fcf (fd&c Blue #1)] Hives, Nausea Only        Medication List        Accurate as of April 29, 2021 11:21 AM. If you have any questions, ask your nurse  or doctor.          STOP taking these medications    Telmisartan-amLODIPine 80-5 MG Tabs Stopped by: [name], MD       TAKE these medications    amLODipine 10 MG tablet Commonly known as: NORVASC Take 10 mg by mouth daily.   aspirin EC 81 MG tablet Take 81 mg by mouth daily.   candesartan 32 MG tablet Commonly known as: ATACAND Take 32 mg by mouth daily.   exemestane 25 MG tablet Commonly known as: AROMASIN TAKE 1 TABLET BY MOUTH EVERY DAY   hydrochlorothiazide 25 MG tablet Commonly known as: HYDRODIURIL Take 25 mg by mouth daily.   ibuprofen 200 MG tablet Commonly known as: ADVIL Take 200 mg by mouth every 6 (six) hours as needed.   meloxicam 7.5 MG tablet Commonly known as: MOBIC Take 7.5 mg by mouth daily as needed.   methylPREDNISolone 4 MG Tbpk tablet Commonly known as: MEDROL DOSEPAK Take as directed Started by: [name], MD   metoprolol succinate 50 MG 24 hr tablet Commonly known as: TOPROL-XL Take 50 mg by mouth daily.   simvastatin 20 MG tablet Commonly known as: ZOCOR Take 20 mg by mouth  daily.   Vitamin D (Ergocalciferol) 1.25 MG (50000 UNIT) Caps capsule Commonly known as: DRISDOL TAKE 1 CAPSULE BY MOUTH ONE TIME PER WEEK        Allergies:  Allergies  Allergen Reactions   Contrast Media [Iodinated Diagnostic Agents] Hives, Shortness Of Breath and Nausea And Vomiting    Allergic reaction even after pre-meds.   Metrizamide Hives, Shortness Of Breath and Nausea And Vomiting    Allergic reaction even after pre-meds.   Other Shortness Of Breath, Nausea And Vomiting and Rash    Allergic reaction even after pre-meds.   Brilliant Blue Fcf Hives and Nausea Only   Dye Fdc Blue [Brilliant Blue Fcf (Fd&C Blue #1)] Hives and Nausea Only    Past Medical History, Surgical history, Social history, and Family History were reviewed and updated.  Review of Systems: Review of Systems  Constitutional: Negative.   HENT:  Positive  for hearing loss.   Eyes:  Positive for discharge.  Respiratory: Negative.    Cardiovascular: Negative.   Gastrointestinal: Negative.   Genitourinary: Negative.   Musculoskeletal: Negative.   Skin: Negative.   Neurological: Negative.   Endo/Heme/Allergies: Negative.   Psychiatric/Behavioral: Negative.      Physical Exam:  weight is 185 lb (83.9 kg). Her oral temperature is 98.7 F (37.1 C). Her blood pressure is 149/67 (abnormal) and her pulse is 84. Her respiration is 20.   Wt Readings from Last 3 Encounters:  04/29/21 185 lb (83.9 kg)  03/18/21 189 lb (85.7 kg)  09/17/20 172 lb (78 kg)   Physical Exam Vitals reviewed.  Constitutional:      Comments: On her breast exam, her left breast is without any masses, edema or erythema.  There is no left axillary adenopathy.  There is no discharge from the nipple on the left breast.  Right chest wall shows radiation dermatitis which is about the same.  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  There is no obvious exudate.  There is no tenderness.  There is no right axillary adenopathy.  HENT:     Head: Normocephalic and atraumatic.  Eyes:     Pupils: Pupils are equal, round, and reactive to light.     Comments: Her ocular exam does show this stye on the upper eyelid of the left eye.  There is an area of purulent material that looks like he might be coming out soon.  She has good extraocular muscle movement.  Pupils react appropriately.  Cardiovascular:     Rate and Rhythm: Normal rate and regular rhythm.     Heart sounds: Normal heart sounds.  Pulmonary:     Effort: Pulmonary effort is normal.     Breath sounds: Normal breath sounds.  Abdominal:     General: Bowel sounds are normal.     Palpations: Abdomen is soft.  Musculoskeletal:        General: No tenderness or deformity. Normal range of motion.     Cervical back: Normal range of motion.  Lymphadenopathy:     Cervical: No cervical adenopathy.  Skin:     General: Skin is warm and dry.     Findings: No erythema or rash.  Neurological:     Mental Status: She is alert and oriented to person, place, and time.  Psychiatric:        Behavior: Behavior normal.        Thought Content: Thought content normal.        Judgment: Judgment normal.  Lab Results  Component Value Date   WBC 6.9 04/29/2021   HGB 10.5 (L) 04/29/2021   HCT 32.6 (L) 04/29/2021   MCV 90.3 04/29/2021   PLT 230 04/29/2021   Lab Results  Component Value Date   FERRITIN 572 (H) 03/18/2021   IRON 49 03/18/2021   TIBC 226 (L) 03/18/2021   UIBC 177 03/18/2021   IRONPCTSAT 22 03/18/2021   Lab Results  Component Value Date   RETICCTPCT 1.6 04/29/2021   RBC 3.63 (L) 04/29/2021   RETICCTABS 55.2 06/20/2011   No results found for: KPAFRELGTCHN, LAMBDASER, KAPLAMBRATIO No results found for: Osborne Casco Lab Results  Component Value Date   TOTALPROTELP 6.6 08/14/2010   ALBUMINELP 53.8 (L) 08/14/2010   A1GS 5.3 (H) 08/14/2010   A2GS 14.3 (H) 08/14/2010   BETS 5.8 08/14/2010   BETA2SER 6.3 08/14/2010   GAMS 14.5 08/14/2010   MSPIKE NOT DET 08/14/2010   SPEI * 08/14/2010     Chemistry      Component Value Date/Time   NA 143 04/29/2021 0953   NA 146 (H) 07/23/2017 0900   NA 144 02/18/2017 0901   K 3.8 04/29/2021 0953   K 3.7 07/23/2017 0900   K 3.7 02/18/2017 0901   CL 105 04/29/2021 0953   CL 107 07/23/2017 0900   CO2 29 04/29/2021 0953   CO2 31 07/23/2017 0900   CO2 27 02/18/2017 0901   BUN 14 04/29/2021 0953   BUN 16 07/23/2017 0900   BUN 17.7 02/18/2017 0901   CREATININE 0.68 04/29/2021 0953   CREATININE 0.8 07/23/2017 0900   CREATININE 0.7 02/18/2017 0901      Component Value Date/Time   CALCIUM 9.5 04/29/2021 0953   CALCIUM 9.3 07/23/2017 0900   CALCIUM 9.3 02/18/2017 0901   ALKPHOS 60 04/29/2021 0953   ALKPHOS 67 07/23/2017 0900   ALKPHOS 78 02/18/2017 0901   AST 11 (L) 04/29/2021 0953   AST 13 02/18/2017 0901   ALT 9  04/29/2021 0953   ALT 20 07/23/2017 0900   ALT 12 02/18/2017 0901   BILITOT 0.4 04/29/2021 0953   BILITOT 0.54 02/18/2017 0901       Impression and Plan: Ms. Ertel is a very pleasant 70 yo caucasian female with history of locally recurrent adenocarcinoma of the right breast with resection and radiation.    I think that she probably has some type of inflammatory arthropathy causing her problems.  Again, I do not think this is from the Aromasin.  She has been off the Aromasin a month now.  I will have her stay off the Aromasin.  She again, will see her family doctor this week.  Maybe, the Medrol Dosepak will help her feel a little bit better.  I will plan to see her back after Labor Day.  Maybe, by then, she will be feeling better.      [name], MD 7/18/202211:21 AM

## 2021-04-29 NOTE — Telephone Encounter (Signed)
Appts made and printed per 04/29/21 los  [employer]

## 2021-04-30 LAB — RHEUMATOID FACTOR: Rheumatoid fact SerPl-aCnc: 434.8 IU/mL — ABNORMAL HIGH (ref <14.0)

## 2021-05-01 ENCOUNTER — Encounter (HOSPITAL_BASED_OUTPATIENT_CLINIC_OR_DEPARTMENT_OTHER): Payer: Medicare Other | Attending: Physician Assistant | Admitting: Physician Assistant

## 2021-05-01 ENCOUNTER — Other Ambulatory Visit: Payer: Self-pay

## 2021-05-01 DIAGNOSIS — I890 Lymphedema, not elsewhere classified: Secondary | ICD-10-CM | POA: Diagnosis not present

## 2021-05-01 DIAGNOSIS — L98492 Non-pressure chronic ulcer of skin of other sites with fat layer exposed: Secondary | ICD-10-CM | POA: Diagnosis not present

## 2021-05-01 DIAGNOSIS — L598 Other specified disorders of the skin and subcutaneous tissue related to radiation: Secondary | ICD-10-CM | POA: Diagnosis not present

## 2021-05-01 DIAGNOSIS — Z86718 Personal history of other venous thrombosis and embolism: Secondary | ICD-10-CM | POA: Diagnosis not present
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
## 2021-05-02 NOTE — Progress Notes (Signed)
ANJANNETTE, GAUGER ([id number]) Visit Report for 05/01/2021 Arrival Information Details Patient Name: Date of Service: [name] 05/01/2021 9:15 A M Medical Record Number: [id number] Patient Account Number: [id number] Date of Birth/Sex: Treating RN: 1951/10/12 (70 y.o. [name], Meta.Reding Primary Care Kensy Blizard: [name] Other Clinician: Referring Gurpreet Mikhail: Treating Pihu Basil/Extender: [name], [name]: 52 Visit Information History Since Last Visit Added or deleted any medications: Yes Patient Arrived: Ambulatory Any new allergies or adverse reactions: No Arrival Time: 09:20 Had a fall or experienced change in No Accompanied By: self activities of daily living that may affect Transfer Assistance: None risk of falls: Patient Identification Verified: Yes Signs or symptoms of abuse/neglect since last visito No Secondary Verification Process Completed: Yes Hospitalized since last visit: No Patient Requires Transmission-Based Precautions: No Implantable device outside of the clinic excluding No Patient Has Alerts: No cellular tissue based products placed in the center since last visit: Has Dressing in Place as Prescribed: Yes Pain Present Now: No Electronic Signature(s) Signed: 05/01/2021 2:16:29 PM By: [name] Entered By: [name] on 05/01/2021 09:24:38 -------------------------------------------------------------------------------- Clinic Level of Care Assessment Details Patient Name: Date of Service: [name], [name] 05/01/2021 9:15 A M Medical Record Number: [id number] Patient Account Number: [id number] Date of Birth/Sex: Treating RN: 12-23-1950 (70 y.o. [name], [name] Primary Care Kailyn Vanderslice: [name] Other Clinician: Referring Loryn Haacke: Treating Urias Sheek/Extender: [name], [name]: 36 Clinic Level of Care Assessment Items TOOL 4 Quantity Score '[]'  - 0 Use when only an EandM is  performed on FOLLOW-UP visit ASSESSMENTS - Nursing Assessment / Reassessment X- 1 10 Reassessment of Co-morbidities (includes updates in patient status) X- 1 5 Reassessment of Adherence to Treatment Plan ASSESSMENTS - Wound and Skin A ssessment / Reassessment X - Simple Wound Assessment / Reassessment - one wound 1 5 '[]'  - 0 Complex Wound Assessment / Reassessment - multiple wounds '[]'  - 0 Dermatologic / Skin Assessment (not related to wound area) ASSESSMENTS - Focused Assessment '[]'  - 0 Circumferential Edema Measurements - multi extremities '[]'  - 0 Nutritional Assessment / Counseling / Intervention '[]'  - 0 Lower Extremity Assessment (monofilament, tuning fork, pulses) '[]'  - 0 Peripheral Arterial Disease Assessment (using hand held doppler) ASSESSMENTS - Ostomy and/or Continence Assessment and Care '[]'  - 0 Incontinence Assessment and Management '[]'  - 0 Ostomy Care Assessment and Management (repouching, etc.) PROCESS - Coordination of Care X - Simple Patient / Family Education for ongoing care 1 15 '[]'  - 0 Complex (extensive) Patient / Family Education for ongoing care X- 1 10 Staff obtains Programmer, systems, Records, T Results / Process Orders est '[]'  - 0 Staff telephones HHA, Nursing Homes / Clarify orders / etc '[]'  - 0 Routine Transfer to another Facility (non-emergent condition) '[]'  - 0 Routine Hospital Admission (non-emergent condition) '[]'  - 0 New Admissions / Biomedical engineer / Ordering NPWT Apligraf, etc. , '[]'  - 0 Emergency Hospital Admission (emergent condition) X- 1 10 Simple Discharge Coordination '[]'  - 0 Complex (extensive) Discharge Coordination PROCESS - Special Needs '[]'  - 0 Pediatric / Minor Patient Management '[]'  - 0 Isolation Patient Management '[]'  - 0 Hearing / Language / Visual special needs '[]'  - 0 Assessment of Community assistance (transportation, D/C planning, etc.) '[]'  - 0 Additional assistance / Altered mentation '[]'  - 0 Support Surface(s) Assessment  (bed, cushion, seat, etc.) INTERVENTIONS - Wound Cleansing / Measurement X - Simple Wound Cleansing - one wound 1 5 '[]'  - 0 Complex Wound Cleansing - multiple  wounds X- 1 5 Wound Imaging (photographs - any number of wounds) '[]'  - 0 Wound Tracing (instead of photographs) X- 1 5 Simple Wound Measurement - one wound '[]'  - 0 Complex Wound Measurement - multiple wounds INTERVENTIONS - Wound Dressings X - Small Wound Dressing one or multiple wounds 1 10 '[]'  - 0 Medium Wound Dressing one or multiple wounds '[]'  - 0 Large Wound Dressing one or multiple wounds X- 1 5 Application of Medications - topical '[]'  - 0 Application of Medications - injection INTERVENTIONS - Miscellaneous '[]'  - 0 External ear exam '[]'  - 0 Specimen Collection (cultures, biopsies, blood, body fluids, etc.) '[]'  - 0 Specimen(s) / Culture(s) sent or taken to Lab for analysis '[]'  - 0 Patient Transfer (multiple staff / Civil Service fast streamer / Similar devices) '[]'  - 0 Simple Staple / Suture removal (25 or less) '[]'  - 0 Complex Staple / Suture removal (26 or more) '[]'  - 0 Hypo / Hyperglycemic Management (close monitor of Blood Glucose) '[]'  - 0 Ankle / Brachial Index (ABI) - do not check if billed separately X- 1 5 Vital Signs Has the patient been seen at the hospital within the last three years: Yes Total Score: 90 Level Of Care: New/Established - Level 3 Electronic Signature(s) Signed: 05/01/2021 6:23:27 PM By: Baruch Gouty RN, BSN Entered By: Baruch Gouty on 05/01/2021 09:50:05 -------------------------------------------------------------------------------- Encounter Discharge Information Details Patient Name: Date of Service: [name], [name] 05/01/2021 9:15 A M Medical Record Number: [id number] Patient Account Number: [id number] Date of Birth/Sex: Treating RN: [birth year] (70 y.o. [name] Primary Care Teddy Rebstock: [name] Other Clinician: Referring Giovonnie Trettel: Treating Calan Doren/Extender: [name],  [name]: 36 Encounter Discharge Information Items Discharge Condition: Stable Ambulatory Status: Ambulatory Discharge Destination: Home Transportation: Private Auto Accompanied By: self Schedule Follow-up Appointment: Yes Clinical Summary of Care: Patient Declined Electronic Signature(s) Signed: 05/01/2021 6:23:27 PM By: Baruch Gouty RN, BSN Entered By: Baruch Gouty on 05/01/2021 09:57:39 -------------------------------------------------------------------------------- Lower Extremity Assessment Details Patient Name: Date of Service: [name], [name] 05/01/2021 9:15 A M Medical Record Number: [id number] Patient Account Number: [id number] Date of Birth/Sex: Treating RN: 1951/04/04 (70 y.o. [name] Primary Care Brynlei Klausner: [name] Other Clinician: Referring Shavy Beachem: Treating Akisha Sturgill/Extender: [name], [name]: 36 Electronic Signature(s) Signed: 05/01/2021 2:16:29 PM By: [name] Entered By: [name] on 05/01/2021 09:25:19 -------------------------------------------------------------------------------- [place] Details Patient Name: Date of Service: [name], [name] 05/01/2021 9:15 A M Medical Record Number: [id number] Patient Account Number: [id number] Date of Birth/Sex: Treating RN: 25-Aug-1951 (70 y.o. [name] Primary Care Nyiesha Beever: [name] Other Clinician: Referring Shealyn Sean: Treating Tamie Minteer/Extender: [name], [name] in Treatment: [place] reviewed with physician Active Inactive Wound/Skin Impairment Nursing Diagnoses: Impaired tissue integrity Knowledge deficit related to ulceration/compromised skin integrity Goals: Patient/caregiver will verbalize understanding of skin care regimen Date Initiated: 08/22/2020 Target Resolution Date: 05/22/2021 Goal Status: Active Ulcer/skin breakdown will have a  volume reduction of 30% by week 4 Date Initiated: 08/22/2020 Date Inactivated: 10/03/2020 Target Resolution Date: 09/19/2020 Goal Status: Met Interventions: Assess patient/caregiver ability to obtain necessary supplies Assess patient/caregiver ability to perform ulcer/skin care regimen upon admission and as needed Assess ulceration(s) every visit Treatment Activities: Skin care regimen initiated : 08/22/2020 Topical wound management initiated : 08/22/2020 Notes: Electronic Signature(s) Signed: 05/01/2021 6:23:27 PM By: Baruch Gouty RN, BSN Entered By: Baruch Gouty on 05/01/2021 09:44:30 -------------------------------------------------------------------------------- Pain Assessment Details Patient Name: Date of Service:  [name], [name] 05/01/2021 9:15 A M Medical Record Number: [id number] Patient Account Number: [id number] Date of Birth/Sex: Treating RN: 1950-12-09 (70 y.o. [name] Primary Care Brisha Mccabe: [name] Other Clinician: Referring Geza Beranek: Treating Kenniel Bergsma/Extender: [name], [name]: 36 Active Problems Location of Pain Severity and Description of Pain Patient Has Paino No Site Locations Rate the pain. Rate the pain. Current Pain Level: 0 Pain Management and Medication Current Pain Management: Medication: No Cold Application: No Rest: No Massage: No Activity: No T.E.N.S.: No Heat Application: No Leg drop or elevation: No Is the Current Pain Management Adequate: Adequate How does your wound impact your activities of daily livingo Sleep: No Bathing: No Appetite: No Relationship With Others: No Bladder Continence: No Emotions: No Bowel Continence: No Work: No Toileting: No Drive: No Dressing: No Hobbies: No Electronic Signature(s) Signed: 05/01/2021 2:16:29 PM By: [name] Entered By: [name] on 05/01/2021  09:25:14 -------------------------------------------------------------------------------- Patient/Caregiver Education Details Patient Name: Date of Service: [name], [name] 7/20/2022andnbsp9:15 A M Medical Record Number: [id number] Patient Account Number: [id number] Date of Birth/Gender: Treating RN: 1951-02-13 (70 y.o. [name] Primary Care Physician: [name] Other Clinician: Referring Physician: Treating Physician/Extender: [name], [name] in Treatment: 36 Education Assessment Education Provided To: Patient Education Topics Provided Wound/Skin Impairment: Methods: Explain/Verbal Responses: Reinforcements needed, State content correctly [employer]) Signed: 05/01/2021 6:23:27 PM By: Baruch Gouty RN, BSN Entered By: Baruch Gouty on 05/01/2021 09:44:44 -------------------------------------------------------------------------------- Wound Assessment Details Patient Name: Date of Service: [name], [name] 05/01/2021 9:15 A M Medical Record Number: [id number] Patient Account Number: [id number] Date of Birth/Sex: Treating RN: 14-Jun-1951 (70 y.o. [name], [name] Primary Care Dorie Ohms: [name] Other Clinician: Referring Alecxis Baltzell: Treating Nicolina Hirt/Extender: [name], [name]: 36 Wound Status Wound Number: 1 Primary Soft Tissue Radionecrosis Etiology: Wound Location: Right Breast (mastectomy site) Wound Open Wounding Event: Radiation Burn Status: Date Acquired: 07/26/2020 Comorbid Anemia, Lymphedema, Deep Vein Thrombosis, Hypertension, Weeks Of Treatment: 36 History: Peripheral Venous Disease, Osteoarthritis, Received Clustered Wound: No Chemotherapy, Received Radiation, Confinement Anxiety Wound Measurements Length: (cm) 1.4 Width: (cm) 1.3 Depth: (cm) 0.3 Area: (cm) 1.429 Volume: (cm) 0.429 % Reduction in Area: 6.7% % Reduction in Volume: -180.4% Epithelialization:  None Tunneling: No Undermining: No Wound Description Classification: Full Thickness Without Exposed Support Structures Wound Margin: Flat and Intact Exudate Amount: Medium Exudate Type: Serous Exudate Color: amber Foul Odor After Cleansing: No Slough/Fibrino Yes Wound Bed Granulation Amount: None Present (0%) Exposed Structure Necrotic Amount: Large (67-100%) Fascia Exposed: No Necrotic Quality: Adherent Slough Fat Layer (Subcutaneous Tissue) Exposed: Yes Tendon Exposed: No Muscle Exposed: No Joint Exposed: No Bone Exposed: No Treatment Notes Wound #1 (Breast (mastectomy site)) Wound Laterality: Right Cleanser Soap and Water Discharge Instruction: May shower and wash wound with dial antibacterial soap and water prior to dressing change. Peri-Wound Care AandD Ointment Discharge Instruction: to scar tissue daily Topical Primary Dressing KerraCel Ag Gelling Fiber Dressing, 2x2 in (silver alginate) Discharge Instruction: Apply silver alginate to wound bed as instructed Secondary Dressing Woven Gauze Sponge, Non-Sterile 4x4 in Discharge Instruction: Apply over primary dressing as directed. Secured With 5M [place] Surgical T ape, 2x2 (in/yd) Discharge Instruction: Secure dressing with tape as directed. Compression Wrap Compression Stockings Add-Ons Electronic Signature(s) Signed: 05/01/2021 2:16:29 PM By: [name] Entered By: [name] on 05/01/2021 09:32:13 -------------------------------------------------------------------------------- Vitals Details Patient Name: Date of Service: [name], [name] 05/01/2021 9:15 A M  Medical Record Number: [id number] Patient Account Number: [id number] Date of Birth/Sex: Treating RN: 1950-12-12 (70 y.o. [name], [name] Primary Care Kamariyah Timberlake: [name] Other Clinician: Referring Jermery Caratachea: Treating Gracey Tolle/Extender: [name], [name]: 36 Vital Signs Time Taken:  09:20 Temperature (F): 98.2 Height (in): 63 Pulse (bpm): 75 Weight (lbs): 176 Respiratory Rate (breaths/min): 20 Body Mass Index (BMI): 31.2 Blood Pressure (mmHg): 164/76 Reference Range: 80 - 120 mg / dl Electronic Signature(s) Signed: 05/01/2021 2:16:29 PM By: [name] Entered By: [name] on 05/01/2021 09:24:58

## 2021-05-06 DIAGNOSIS — M79603 Pain in arm, unspecified: Secondary | ICD-10-CM | POA: Diagnosis not present

## 2021-05-06 DIAGNOSIS — Z6832 Body mass index (BMI) 32.0-32.9, adult: Secondary | ICD-10-CM | POA: Diagnosis not present

## 2021-05-06 DIAGNOSIS — M542 Cervicalgia: Secondary | ICD-10-CM | POA: Diagnosis not present

## 2021-05-06 DIAGNOSIS — M25512 Pain in left shoulder: Secondary | ICD-10-CM | POA: Diagnosis not present

## 2021-05-06 DIAGNOSIS — M47812 Spondylosis without myelopathy or radiculopathy, cervical region: Secondary | ICD-10-CM | POA: Diagnosis not present

## 2021-05-07 ENCOUNTER — Inpatient Hospital Stay: Payer: Medicare Other

## 2021-05-07 ENCOUNTER — Other Ambulatory Visit: Payer: Self-pay

## 2021-05-07 VITALS — BP 134/55 | HR 57 | Temp 98.6°F | Resp 17 | Wt 181.0 lb

## 2021-05-07 DIAGNOSIS — Z923 Personal history of irradiation: Secondary | ICD-10-CM | POA: Diagnosis not present

## 2021-05-07 DIAGNOSIS — Z7982 Long term (current) use of aspirin: Secondary | ICD-10-CM | POA: Diagnosis not present

## 2021-05-07 DIAGNOSIS — M2550 Pain in unspecified joint: Secondary | ICD-10-CM | POA: Diagnosis not present

## 2021-05-07 DIAGNOSIS — C50911 Malignant neoplasm of unspecified site of right female breast: Secondary | ICD-10-CM | POA: Diagnosis not present

## 2021-05-07 DIAGNOSIS — D508 Other iron deficiency anemias: Secondary | ICD-10-CM

## 2021-05-07 DIAGNOSIS — M7989 Other specified soft tissue disorders: Secondary | ICD-10-CM | POA: Diagnosis not present

## 2021-05-07 DIAGNOSIS — Z79811 Long term (current) use of aromatase inhibitors: Secondary | ICD-10-CM | POA: Diagnosis not present

## 2021-05-07 LAB — ANTINUCLEAR ANTIBODIES, IFA: ANA Ab, IFA: NEGATIVE

## 2021-05-07 MED ORDER — SODIUM CHLORIDE 0.9 % IV SOLN
510.0000 mg | Freq: Once | INTRAVENOUS | Status: AC
  Administered 2021-05-07: 510 mg via INTRAVENOUS
  Filled 2021-05-07: qty 510

## 2021-05-07 MED ORDER — SODIUM CHLORIDE 0.9 % IV SOLN
INTRAVENOUS | Status: DC
  Filled 2021-05-07: qty 250

## 2021-05-07 NOTE — Patient Instructions (Signed)
Iron Deficiency Anemia, Adult Iron deficiency anemia is when you do not have enough red blood cells or hemoglobin in your blood. This happens because you have too little iron in your body. Hemoglobin carries oxygen to parts of the body. Anemia can cause yourbody to not get enough oxygen. What are the causes? Not eating enough foods that have iron in them. The body not being able to take in iron well. Needing more iron due to pregnancy or heavy menstrual periods, for females. Cancer. Bleeding in the bowels. Many blood draws. What increases the risk? Being pregnant. Being a teenage girl going through a growth spurt. What are the signs or symptoms? Pale skin, lips, and nails. Weakness, dizziness, and getting tired easily. Headache. Feeling like you cannot breathe well when moving (shortness of breath). Cold hands and feet. Fast heartbeat or a heartbeat that is not regular. Feeling grouchy (irritable) or breathing fast. These are more common in very bad anemia. Mild anemia may not cause any symptoms. How is this treated? This condition is treated by finding out why you do not have enough iron and then getting more iron. It may include: Adding foods to your diet that have a lot of iron. Taking iron pills (supplements). If you are pregnant or breastfeeding, you may need to take extra iron. Your diet often does not provide the amount of iron that you need. Getting more vitamin C in your diet. Vitamin C helps your body take in iron. You may need to take iron pills with a glass of orange juice or vitamin C pills. Medicines to make heavy menstrual periods lighter. Surgery. You may need blood tests to see if treatment is working. If the treatment doesnot seem to be working, you may need more tests. Follow these instructions at home: Medicines Take over-the-counter and prescription medicines only as told by your doctor. This includes iron pills and vitamins. Take iron pills when your stomach is  empty. If you cannot handle this, take them with food. Do not drink milk or take antacids at the same time as your iron pills. Iron pills may turn your poop (stool)black. If you cannot handle taking iron pills by mouth, ask your doctor about getting iron through: An IV tube. A shot (injection) into a muscle. Eating and drinking  Talk with your doctor before changing the foods you eat. He or she may tell you to eat foods that have a lot of iron, such as: Liver. Low-fat (lean) beef. Breads and cereals that have iron added to them. Eggs. Dried fruit. Dark green, leafy vegetables. Eat fresh fruits and vegetables that are high in vitamin C. They help your body use iron. Foods with a lot of vitamin C include: Oranges. Peppers. Tomatoes. Mangoes. Drink enough fluid to keep your pee (urine) pale yellow.  Managing constipation If you are taking iron pills, they may cause trouble pooping (constipation). To prevent or treat trouble pooping, you may need to: Take over-the-counter or prescription medicines. Eat foods that are high in fiber. These include beans, whole grains, and fresh fruits and vegetables. Limit foods that are high in fat and sugar. These include fried or sweet foods. General instructions Return to your normal activities as told by your doctor. Ask your doctor what activities are safe for you. Keep yourself clean, and keep things clean around you. Keep all follow-up visits as told by your doctor. This is important. Contact a doctor if: You feel like you may vomit (nauseous), or you vomit. You feel   weak. You are sweating for no reason. You have trouble pooping, such as: Pooping less than 3 times a week. Straining to poop. Having poop that is hard, dry, or larger than normal. Feeling full or bloated. Pain in the lower belly. Not feeling better after pooping. Get help right away if: You pass out (faint). You have chest pain. You have trouble breathing that: Is very  bad. Gets worse with physical activity. You have a fast heartbeat, or a heartbeat that does not feel regular. You get light-headed when getting up from sitting or lying down. These symptoms may be an emergency. Do not wait to see if the symptoms will go away. Get medical help right away. Call your local emergency services (911 in the [place] Do not drive yourself to the hospital. Summary Iron deficiency anemia is when you have too little iron in your body. This condition is treated by finding out why you do not have enough iron in your body and then getting more iron. Take over-the-counter and prescription medicines only as told by your doctor. Eat fresh fruits and vegetables that are high in vitamin C. Get help right away if you cannot breathe well. This information is not intended to replace advice given to you by your health care provider. Make sure you discuss any questions you have with your healthcare provider. Document Revised: 06/07/2019 Document Reviewed: 06/07/2019 [employer] Patient Education  2022 [employer]  

## 2021-05-13 ENCOUNTER — Other Ambulatory Visit: Payer: Self-pay | Admitting: Hematology & Oncology

## 2021-05-13 DIAGNOSIS — Z1231 Encounter for screening mammogram for malignant neoplasm of breast: Secondary | ICD-10-CM

## 2021-05-27 DIAGNOSIS — M4722 Other spondylosis with radiculopathy, cervical region: Secondary | ICD-10-CM | POA: Diagnosis not present

## 2021-05-27 DIAGNOSIS — Z6833 Body mass index (BMI) 33.0-33.9, adult: Secondary | ICD-10-CM | POA: Diagnosis not present

## 2021-05-27 DIAGNOSIS — M47812 Spondylosis without myelopathy or radiculopathy, cervical region: Secondary | ICD-10-CM | POA: Diagnosis not present

## 2021-05-30 DIAGNOSIS — R293 Abnormal posture: Secondary | ICD-10-CM | POA: Diagnosis not present

## 2021-05-30 DIAGNOSIS — M6281 Muscle weakness (generalized): Secondary | ICD-10-CM | POA: Diagnosis not present

## 2021-05-30 DIAGNOSIS — M25512 Pain in left shoulder: Secondary | ICD-10-CM | POA: Diagnosis not present

## 2021-05-30 DIAGNOSIS — M542 Cervicalgia: Secondary | ICD-10-CM | POA: Diagnosis not present

## 2021-06-03 DIAGNOSIS — M25512 Pain in left shoulder: Secondary | ICD-10-CM | POA: Diagnosis not present

## 2021-06-03 DIAGNOSIS — M6281 Muscle weakness (generalized): Secondary | ICD-10-CM | POA: Diagnosis not present

## 2021-06-03 DIAGNOSIS — M542 Cervicalgia: Secondary | ICD-10-CM | POA: Diagnosis not present

## 2021-06-03 DIAGNOSIS — R293 Abnormal posture: Secondary | ICD-10-CM | POA: Diagnosis not present

## 2021-06-05 ENCOUNTER — Encounter (HOSPITAL_BASED_OUTPATIENT_CLINIC_OR_DEPARTMENT_OTHER): Payer: Medicare Other | Attending: Physician Assistant | Admitting: Physician Assistant

## 2021-06-05 ENCOUNTER — Other Ambulatory Visit: Payer: Self-pay

## 2021-06-05 DIAGNOSIS — Z9221 Personal history of antineoplastic chemotherapy: Secondary | ICD-10-CM | POA: Insufficient documentation

## 2021-06-05 DIAGNOSIS — Z86718 Personal history of other venous thrombosis and embolism: Secondary | ICD-10-CM | POA: Insufficient documentation

## 2021-06-05 DIAGNOSIS — I739 Peripheral vascular disease, unspecified: Secondary | ICD-10-CM | POA: Insufficient documentation

## 2021-06-05 DIAGNOSIS — L98492 Non-pressure chronic ulcer of skin of other sites with fat layer exposed: Secondary | ICD-10-CM | POA: Insufficient documentation

## 2021-06-05 DIAGNOSIS — I890 Lymphedema, not elsewhere classified: Secondary | ICD-10-CM | POA: Diagnosis not present

## 2021-06-05 DIAGNOSIS — Z9011 Acquired absence of right breast and nipple: Secondary | ICD-10-CM | POA: Diagnosis not present

## 2021-06-05 DIAGNOSIS — L598 Other specified disorders of the skin and subcutaneous tissue related to radiation: Secondary | ICD-10-CM | POA: Insufficient documentation

## 2021-06-05 DIAGNOSIS — L589 Radiodermatitis, unspecified: Secondary | ICD-10-CM | POA: Diagnosis not present

## 2021-06-05 DIAGNOSIS — I10 Essential (primary) hypertension: Secondary | ICD-10-CM | POA: Insufficient documentation

## 2021-06-05 NOTE — Progress Notes (Addendum)
[name], [name] ([id number]) Visit Report for 06/05/2021 Chief Complaint Document Details Patient Name: Date of Service: [name] 06/05/2021 9:15 A M Medical Record Number: [id number] Patient Account Number: [id number] Date of Birth/Sex: Treating RN: [birth year] (70 y.o. 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 [name] Primary Care Provider: [name] Other Clinician: Referring Provider: Treating Provider/Extender: [name], [name]: 21 History of Present Illness HPI Description: 08/22/2020 upon evaluation today patient actually appears to be doing somewhat poorly in regard to her mastectomy site on the right chest wall. She had a mastectomy initially in 1998. She had chemotherapy only no radiation following. In 2002 she subsequently did undergo radiation for the first time and then subsequently in 2012 had repeat radiation after having had a finding that was consistent with a relapse of the cancer. 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 She also has radiation damage to the skin over the right chest wall where she had the mastectomy. She was referred to [place] by Dr. Matthew Saras in [facility] and her oncologist is Dr. Burney Gauze.  Subsequently I want to make sure before we delve into this more deeply that the patient does not have any issues with a return of cancer that needs to be managed by them. 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 Nonetheless she does not remember exactly when her last PET scan was but it was not too recently she tells me. 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 The patient does have hypertension as well. She sees her oncologist next on December 6. 09/12/2020 on evaluation today patient actually appears to be doing excellent in regard to her wound under the right breast location. Currently this is measuring smaller in general seems to be doing very well. She tells me that the collagen is doing a good job and that the dressing that she has been putting on is not causing any troubles as far pulling on her skin or scar tissue is concerned all of which is excellent news. 10/03/2020 upon evaluation today patient appears to be doing well with regard to her surgical site from her mastectomy on the right breast region. She tells me that she has had very little bleeding nothing seems to be sticking badly and in general she has been extremely pleased with where things stand today. No fevers, chills, nausea, vomiting, or diarrhea. 10/24/2020 upon evaluation today patient actually appears to be doing excellent in regard to her wound. There is just a very small area still open and to be honest there was minimal slough noted on the surface of the wound nothing that requires sharp debridement. In general I feel like she is actually doing quite well and overall I am pleased. I do think we may switch to silver alginate dressing and  also contemplating using a AandE ointment in order to help keep everything moist and the scar tissue region while the alginate  keeps it dry enough to heal 11/14/2020 upon evaluation today patient appears to be doing well at this point in regard to her wound. I do feel like that she is making good progress again this is a very difficult region over the surgical site of the right chest wall at the site of mastectomy. Nonetheless I think that we are just a very small area still open I think alginate is doing a good job helping to dry this up and I would recommend this such that we continue with this. 01/02/2021 on evaluation today patient's wound actually appears to be doing decently well. There does not appear to be any signs of active infection which is great news. She does have some slight slough buildup with biofilm on the surface of the wound mainly more biofilm. Nonetheless I was able to gently remove this with saline and gauze as well as a sterile Q-tip. She tolerated that today without complication. 01/30/2021 upon evaluation today patient appears to be doing well with regard to her wound although she still has quite a bit of trouble getting this to close I think the scar tissue and radiation damage is quite significant here unfortunately. There does not appear to be any signs of active infection which is great news. No fevers, chills, nausea, vomiting, or diarrhea. 02/27/2021 upon evaluation today patient appears to be doing excellent in regard to her wound. She has been tolerating the dressing changes without complication and in general I am extremely pleased with where things stand today. There does not appear to be any signs of infection which is great news. No fevers, chills, nausea, vomiting, or diarrhea. With that being said I do think that she still developing some slough buildup. I did discuss with her today the possibility of proceeding with HBO therapy. We have had this discussion before but she really is not quite committed to wanting to do that much and spend that much time in the chamber. She wants to still think  about it. 03/27/2021 upon evaluation today patient appears to be doing about the same in regard to her wound. She still developing a lot of slough buildup on the surface of the wound. I did try to clean that away to some degree today. She tolerated that without any pain or complication. Subsequently I am thinking we may want to switch over to [facility] see if this may do better for her. Patient is in agreement with giving that a trial. 05/01/2021 upon evaluation today patient appears to be doing about the same in regard to her wounds. She has been tolerating the dressing changes without complication. Fortunately there does not appear to be any signs of active infection at this time which is great news. No fevers, chills, nausea, vomiting, or diarrhea.. 06/05/2021 upon evaluation today patient appears to be doing pretty well currently in regard to her wound at the mastectomy site right chest wall. Overall since we switch back to the alginate she tells me things have significantly improved she is much happier with where things stand currently. Fortunately there does not appear to be any signs of active infection which is great news. No fevers, chills, nausea, vomiting, or diarrhea. Electronic Signature(s) Signed: 06/05/2021 1:44:58 PM By: Worthy Keeler PA-C Entered By: Worthy Keeler on 06/05/2021 13:44:58 -------------------------------------------------------------------------------- Physical Exam Details Patient Name: Date of Service: [name], [name]  06/05/2021 9:15 A M Medical Record Number: [id number] Patient Account Number: [id number] Date of Birth/Sex: Treating RN: May 15, 1951 (70 y.o. [name] Primary Care Provider: [name] Other Clinician: Referring Provider: Treating Provider/Extender: [name], [name]: 14 Constitutional [name] and well-hydrated in no acute distress. Respiratory normal breathing without  difficulty. Psychiatric this patient is able to make decisions and demonstrates good insight into disease process. Alert and Oriented x 3. pleasant and cooperative. Notes Upon inspection patient's wound bed showed signs of fairly good granulation and epithelization in some areas she did have some dry eschar and others but again the best thing seems to be to try to keep this as dry as possible when we use the Santyl and it got wet and actually started to spread which is not what we want. The patient is happier with where things stand today. Electronic Signature(s) Signed: 06/05/2021 1:45:23 PM By: Worthy Keeler PA-C Entered By: Worthy Keeler on 06/05/2021 13:45:23 -------------------------------------------------------------------------------- Physician Orders Details Patient Name: Date of Service: [name], [name] 06/05/2021 9:15 A M Medical Record Number: [id number] Patient Account Number: [id number] Date of Birth/Sex: Treating RN: 01-19-1951 (69 y.o. [name] Primary Care Provider: [name] Other Clinician: Referring Provider: Treating Provider/Extender: [name], [name]: [phone] Verbal / Phone Orders: No Diagnosis Coding ICD-10 Coding Code Description [phone] Non-pressure chronic ulcer of skin of other sites with fat layer exposed L58.9 Radiodermatitis, unspecified L59.8 Other specified disorders of the skin and subcutaneous tissue related to radiation I10 Essential (primary) hypertension Follow-up Appointments Return appointment in 1 month. - with [facility] Shower/ Hygiene May shower and wash wound with soap and water. - on days that dressing is changed Additional Orders / Instructions Follow Nutritious Diet Wound Treatment Wound #1 - Breast (mastectomy site) Wound Laterality: Right Cleanser: Soap and Water 1 x Per Day/Other:90 days Discharge Instructions: May shower and wash wound with dial antibacterial soap and water  prior to dressing change. Peri-Wound Care: AandD Ointment 1 x Per Day/Other:90 days Discharge Instructions: to scar tissue daily Prim Dressing: KerraCel Ag Gelling Fiber Dressing, 2x2 in (silver alginate) (Dispense As Written) 1 x Per Day/Other:90 days ary Discharge Instructions: Apply silver alginate to wound bed as instructed Secondary Dressing: Woven Gauze Sponge, Non-Sterile 4x4 in 1 x Per Day/Other:90 days Discharge Instructions: Apply over primary dressing as directed. Secured With: 58M Medipore H Soft Cloth Surgical Tape, 2x2 (in/yd) 1 x Per Day/Other:90 days Discharge Instructions: Secure dressing with tape as directed. Electronic Signature(s) Signed: 06/05/2021 5:22:48 PM By: Lorrin Jackson Signed: 06/05/2021 6:37:42 PM By: Worthy Keeler PA-C Entered By: Lorrin Jackson on 06/05/2021 10:20:13 -------------------------------------------------------------------------------- Problem List Details Patient Name: Date of Service: [name], [name] 06/05/2021 9:15 A M Medical Record Number: [id number] Patient Account Number: [id number] Date of Birth/Sex: Treating RN: May 22, 1951 (70 y.o. [name] Primary Care Provider: [name] Other Clinician: Referring Provider: Treating Provider/Extender: [name], [name]: [phone] Active Problems ICD-10 Encounter Code Description Active Date MDM Diagnosis [id number] Non-pressure chronic ulcer of skin of other sites with fat layer exposed 08/22/2020 No Yes L58.9 Radiodermatitis, unspecified 08/22/2020 No Yes L59.8 Other specified disorders of the skin and subcutaneous tissue related to 08/22/2020 No Yes radiation I10 Essential (primary) hypertension 08/22/2020 No Yes Inactive Problems Resolved Problems Electronic Signature(s) Signed: 06/05/2021 10:10:40 AM By: Worthy Keeler PA-C Entered By: Worthy Keeler on 06/05/2021  10:10:39 -------------------------------------------------------------------------------- Progress Note Details Patient Name:  Date of Service: [name], [name] 06/05/2021 9:15 A M Medical Record Number: [id number] Patient Account Number: [id number] Date of Birth/Sex: Treating RN: 1951/04/24 (70 y.o. 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 She had a mastectomy initially in 1998. She had chemotherapy only no radiation following. In 2002 she subsequently did undergo radiation for the first time and then subsequently in 2012 had repeat radiation after having had a finding that was consistent with a relapse of the cancer. 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 She also has radiation damage to the skin over the right chest wall where she had the mastectomy. She was referred to [place] by Dr. Matthew Saras in [facility] and her oncologist is Dr. Burney Gauze. Subsequently I want to make sure before we delve into this more deeply that the patient does not have any issues with a return of cancer that needs to be managed by them. 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 Nonetheless she does not remember exactly when her last PET scan was but it was not too recently she tells me. 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 The patient does have hypertension as  well. She sees her oncologist next on December 6. 09/12/2020 on evaluation today patient actually appears to be doing excellent in regard to her wound under the right breast location. Currently this is measuring smaller in general seems to be doing very well. She tells me that the collagen is doing a good job and that the dressing that she has been putting on is not causing any troubles as far pulling on her skin or scar tissue is concerned all of which is excellent news. 10/03/2020 upon evaluation today patient appears to be doing well with regard to her surgical site from her mastectomy on the right breast region. She tells me that she has had very little bleeding nothing seems to be sticking badly and in general she has been extremely pleased with where things stand today. No fevers, chills, nausea, vomiting, or diarrhea. 10/24/2020 upon evaluation today patient actually appears to be doing excellent in regard to her wound. There is just a very small area still open and to be honest there was minimal slough noted on the surface of the wound nothing that requires sharp debridement. In general I feel like she is actually doing quite well and overall I am pleased. I do think we may switch to silver alginate dressing and also contemplating using a AandE ointment in order to help keep everything moist and the scar tissue region while the alginate keeps it dry enough to heal 11/14/2020 upon evaluation today patient appears to be doing well at this point in regard to her wound. I do feel like that she is making good progress again this is a very difficult region over the surgical site of the right chest wall at the site of mastectomy. Nonetheless I think that we are just a very small area  still open I think alginate is doing a good job helping to dry this up and I would recommend this such that we continue with this. 01/02/2021 on evaluation today patient's wound actually appears to be doing decently well.  There does not appear to be any signs of active infection which is great news. She does have some slight slough buildup with biofilm on the surface of the wound mainly more biofilm. Nonetheless I was able to gently remove this with saline and gauze as well as a sterile Q-tip. She tolerated that today without complication. 01/30/2021 upon evaluation today patient appears to be doing well with regard to her wound although she still has quite a bit of trouble getting this to close I think the scar tissue and radiation damage is quite significant here unfortunately. There does not appear to be any signs of active infection which is great news. No fevers, chills, nausea, vomiting, or diarrhea. 02/27/2021 upon evaluation today patient appears to be doing excellent in regard to her wound. She has been tolerating the dressing changes without complication and in general I am extremely pleased with where things stand today. There does not appear to be any signs of infection which is great news. No fevers, chills, nausea, vomiting, or diarrhea. With that being said I do think that she still developing some slough buildup. I did discuss with her today the possibility of proceeding with HBO therapy. We have had this discussion before but she really is not quite committed to wanting to do that much and spend that much time in the chamber. She wants to still think about it. 03/27/2021 upon evaluation today patient appears to be doing about the same in regard to her wound. She still developing a lot of slough buildup on the surface of the wound. I did try to clean that away to some degree today. She tolerated that without any pain or complication. Subsequently I am thinking we may want to switch over to [facility] see if this may do better for her. Patient is in agreement with giving that a trial. 05/01/2021 upon evaluation today patient appears to be doing about the same in regard to her wounds. She has been tolerating the  dressing changes without complication. Fortunately there does not appear to be any signs of active infection at this time which is great news. No fevers, chills, nausea, vomiting, or diarrhea.. 06/05/2021 upon evaluation today patient appears to be doing pretty well currently in regard to her wound at the mastectomy site right chest wall. Overall since we switch back to the alginate she tells me things have significantly improved she is much happier with where things stand currently. Fortunately there does not appear to be any signs of active infection which is great news. No fevers, chills, nausea, vomiting, or diarrhea. Objective Constitutional [name] and well-hydrated in no acute distress. Vitals Time Taken: 9:57 AM, Height: 63 in, Weight: 176 lbs, BMI: 31.2, Temperature: 98.4 F, Pulse: 73 bpm, Respiratory Rate: 20 breaths/min, Blood Pressure: 169/77 mmHg. Respiratory normal breathing without difficulty. Psychiatric this patient is able to make decisions and demonstrates good insight into disease process. Alert and Oriented x 3. pleasant and cooperative. General Notes: Upon inspection patient's wound bed showed signs of fairly good granulation and epithelization in some areas she did have some dry eschar and others but again the best thing seems to be to try to keep this as dry as possible when we use the Santyl and it got wet and actually started  to spread which is not what we want. The patient is happier with where things stand today. Integumentary (Hair, Skin) Wound #1 status is Open. Original cause of wound was Radiation Burn. The date acquired was: 07/26/2020. The wound has been in treatment 41 weeks. The wound is located on the Right Breast (mastectomy site). The wound measures 1cm length x 1.4cm width x 0.2cm depth; 1.1cm^2 area and 0.22cm^3 volume. There is Fat Layer (Subcutaneous Tissue) exposed. There is no tunneling or undermining noted. There is a medium amount of serous  drainage noted. The wound margin is distinct with the outline attached to the wound base. There is small (1-33%) red, pink granulation within the wound bed. There is a medium (34- 66%) amount of necrotic tissue within the wound bed including Adherent Slough. Assessment Active Problems ICD-10 Non-pressure chronic ulcer of skin of other sites with fat layer exposed Radiodermatitis, unspecified Other specified disorders of the skin and subcutaneous tissue related to radiation Essential (primary) hypertension Plan Follow-up Appointments: Return appointment in 1 month. - with [facility] Shower/ Hygiene: May shower and wash wound with soap and water. - on days that dressing is changed Additional Orders / Instructions: Follow Nutritious Diet WOUND #1: - Breast (mastectomy site) Wound Laterality: Right Cleanser: Soap and Water 1 x Per Day/Other:90 days Discharge Instructions: May shower and wash wound with dial antibacterial soap and water prior to dressing change. Peri-Wound Care: AandD Ointment 1 x Per Day/Other:90 days Discharge Instructions: to scar tissue daily Prim Dressing: KerraCel Ag Gelling Fiber Dressing, 2x2 in (silver alginate) (Dispense As Written) 1 x Per Day/Other:90 days ary Discharge Instructions: Apply silver alginate to wound bed as instructed Secondary Dressing: Woven Gauze Sponge, Non-Sterile 4x4 in 1 x Per Day/Other:90 days Discharge Instructions: Apply over primary dressing as directed. Secured With: 44M Medipore H Soft Cloth Surgical T ape, 2x2 (in/yd) 1 x Per Day/Other:90 days Discharge Instructions: Secure dressing with tape as directed. 1. I would recommend currently that we going to continue with the wound care measures as before and the patient is in agreement the plan. This includes the use of the silver alginate dressing which I think is doing a great job. 2. I am also can recommend that we have the patient continue with the border foam dressing or else gauze  and tape to secure in place. We will see patient back for reevaluation in 4 weeks here in the clinic. If anything worsens or changes patient will contact our office for additional recommendations. Electronic Signature(s) Signed: 06/05/2021 1:45:58 PM By: Worthy Keeler PA-C Entered By: Worthy Keeler on 06/05/2021 13:45:58 -------------------------------------------------------------------------------- SuperBill Details Patient Name: Date of Service: [name], [place] YE 06/05/2021 Medical Record Number: [id number] Patient Account Number: [id number] Date of Birth/Sex: Treating RN: 1950/11/28 (70 y.o. [name] Primary Care Provider: [name] Other Clinician: Referring Provider: Treating Provider/Extender: [name], [name]: 41 Diagnosis Coding ICD-10 Codes Code Description [phone] Non-pressure chronic ulcer of skin of other sites with fat layer exposed L58.9 Radiodermatitis, unspecified L59.8 Other specified disorders of the skin and subcutaneous tissue related to radiation I10 Essential (primary) hypertension Facility Procedures The patient participates with Medicare or their insurance follows the Medicare Facility Guidelines: CPT4 Code Description Modifier Quantity [id number] [place] VISIT-LEV 3 EST PT 1 Physician Procedures : CPT4 Code Description Modifier [id number] - WC PHYS LEVEL 3 - EST PT ICD-10 Diagnosis Description L98.492 Non-pressure chronic ulcer of skin of other sites with fat layer  exposed L58.9 Radiodermatitis, unspecified L59.8 Other specified disorders of  the skin and subcutaneous tissue related to radiation I10 Essential (primary) hypertension Quantity: 1 Electronic Signature(s) Signed: 06/05/2021 1:47:15 PM By: Worthy Keeler PA-C Entered By: Worthy Keeler on 06/05/2021 13:47:15

## 2021-06-06 DIAGNOSIS — M6281 Muscle weakness (generalized): Secondary | ICD-10-CM | POA: Diagnosis not present

## 2021-06-06 DIAGNOSIS — R293 Abnormal posture: Secondary | ICD-10-CM | POA: Diagnosis not present

## 2021-06-06 DIAGNOSIS — M542 Cervicalgia: Secondary | ICD-10-CM | POA: Diagnosis not present

## 2021-06-06 DIAGNOSIS — M25512 Pain in left shoulder: Secondary | ICD-10-CM | POA: Diagnosis not present

## 2021-06-06 NOTE — Progress Notes (Signed)
AVIAN, GREENAWALT ([id number]) Visit Report for 06/05/2021 Arrival Information Details Patient Name: Date of Service: [name] 06/05/2021 9:15 A M Medical Record Number: [id number] Patient Account Number: [id number] Date of Birth/Sex: Treating RN: 1951-09-14 (70 y.o. [name], [name] Primary Care Jalesia Loudenslager: [name] Other Clinician: Referring Ah Bott: Treating Zyire Eidson/Extender: [name], [name]: 79 Visit Information History Since Last Visit Added or deleted any medications: No Patient Arrived: Ambulatory Any new allergies or adverse reactions: No Arrival Time: 09:56 Had a fall or experienced change in No Accompanied By: self activities of daily living that may affect Transfer Assistance: None risk of falls: Patient Identification Verified: Yes Signs or symptoms of abuse/neglect since last visito No Secondary Verification Process Completed: Yes Hospitalized since last visit: No Patient Requires Transmission-Based Precautions: No Implantable device outside of the clinic excluding No Patient Has Alerts: No cellular tissue based products placed in the center since last visit: Has Dressing in Place as Prescribed: Yes Pain Present Now: No Electronic Signature(s) Signed: 06/06/2021 7:44:17 AM By: Sandre Kitty Entered By: Sandre Kitty on 06/05/2021 09:57:43 -------------------------------------------------------------------------------- Clinic Level of Care Assessment Details Patient Name: Date of Service: [name], [name] 06/05/2021 9:15 A M Medical Record Number: [id number] Patient Account Number: [id number] Date of Birth/Sex: Treating RN: 1951-03-08 (70 y.o. [name] Primary Care Yunior Jain: [name] Other Clinician: Referring Mickey Esguerra: Treating Khamron Gellert/Extender: [name], [name]: 41 Clinic Level of Care Assessment Items TOOL 4 Quantity Score X- 1 0 Use when only an EandM is  performed on FOLLOW-UP visit ASSESSMENTS - Nursing Assessment / Reassessment X- 1 10 Reassessment of Co-morbidities (includes updates in patient status) X- 1 5 Reassessment of Adherence to Treatment Plan ASSESSMENTS - Wound and Skin A ssessment / Reassessment X - Simple Wound Assessment / Reassessment - one wound 1 5 _0  - 0 Complex Wound Assessment / Reassessment - multiple wounds _1  - 0 Dermatologic / Skin Assessment (not related to wound area) ASSESSMENTS - Focused Assessment _2  - 0 Circumferential Edema Measurements - multi extremities _3  - 0 Nutritional Assessment / Counseling / Intervention _4  - 0 Lower Extremity Assessment (monofilament, tuning fork, pulses) _5  - 0 Peripheral Arterial Disease Assessment (using hand held doppler) ASSESSMENTS - Ostomy and/or Continence Assessment and Care _6  - 0 Incontinence Assessment and Management _7  - 0 Ostomy Care Assessment and Management (repouching, etc.) PROCESS - Coordination of Care _8  - 0 Simple Patient / Family Education for ongoing care X- 1 20 Complex (extensive) Patient / Family Education for ongoing care X- 1 10 Staff obtains Programmer, systems, Records, T Results / Process Orders est _9  - 0 Staff telephones HHA, Nursing Homes / Clarify orders / etc _10  - 0 Routine Transfer to another Facility (non-emergent condition) _11  - 0 Routine Hospital Admission (non-emergent condition) _12  - 0 New Admissions / Biomedical engineer / Ordering NPWT Apligraf, etc. , _13  - 0 Emergency Hospital Admission (emergent condition) _14  - 0 Simple Discharge Coordination _15  - 0 Complex (extensive) Discharge Coordination PROCESS - Special Needs _16  - 0 Pediatric / Minor Patient Management _17  - 0 Isolation Patient Management _18  - 0 Hearing / Language / Visual special needs _19  - 0 Assessment of Community assistance (transportation, D/C planning, etc.) _20  - 0 Additional assistance / Altered mentation _21  - 0 Support Surface(s) Assessment  (bed, cushion, seat, etc.) INTERVENTIONS - Wound Cleansing / Measurement X - Simple Wound Cleansing - one wound 1 5 _22  - 0 Complex Wound Cleansing - multiple wounds  X- 1 5 Wound Imaging (photographs - any number of wounds) _0  - 0 Wound Tracing (instead of photographs) X- 1 5 Simple Wound Measurement - one wound _1  - 0 Complex Wound Measurement - multiple wounds INTERVENTIONS - Wound Dressings _2  - 0 Small Wound Dressing one or multiple wounds X- 1 15 Medium Wound Dressing one or multiple wounds _3  - 0 Large Wound Dressing one or multiple wounds <CWCBJSEGBTDVVOHY>_0<\/VPXTGGYIRSWNIOEV>_0  - 0 Application of Medications - topical <JJKKXFGHWEXHBZJI>_9<\/CVELFYBOFBPZWCHE>_5  - 0 Application of Medications - injection INTERVENTIONS - Miscellaneous _6  - 0 External ear exam _7  - 0 Specimen Collection (cultures, biopsies, blood, body fluids, etc.) _8  - 0 Specimen(s) / Culture(s) sent or taken to Lab for analysis _9  - 0 Patient Transfer (multiple staff / Civil Service fast streamer / Similar devices) _10  - 0 Simple Staple / Suture removal (25 or less) _11  - 0 Complex Staple / Suture removal (26 or more) _12  - 0 Hypo / Hyperglycemic Management (close monitor of Blood Glucose) _13  - 0 Ankle / Brachial Index (ABI) - do not check if billed separately X- 1 5 Vital Signs Has the patient been seen at the hospital within the last three years: Yes Total Score: 85 Level Of Care: New/Established - Level 3 Electronic Signature(s) Signed: 06/05/2021 5:22:48 PM By: Lorrin Jackson Entered By: Lorrin Jackson on 06/05/2021 10:21:24 -------------------------------------------------------------------------------- Encounter Discharge Information Details Patient Name: Date of Service: [name], [name] 06/05/2021 9:15 A M Medical Record Number: [id number] Patient Account Number: [id number] Date of Birth/Sex: Treating RN: 24-Aug-1951 (70 y.o. [name] Primary Care Danisa Kopec: [name] Other Clinician: Referring Kealey Kemmer: Treating Tonna Palazzi/Extender: [name],  [name]: [phone] Encounter Discharge Information Items Discharge Condition: Stable Ambulatory Status: Ambulatory Discharge Destination: Home Transportation: Private Auto Schedule Follow-up Appointment: Yes Clinical Summary of Care: Provided on 06/05/2021 Form Type Recipient Paper Patient Patient Electronic Signature(s) Signed: 06/05/2021 5:22:48 PM By: Lorrin Jackson Entered By: Lorrin Jackson on 06/05/2021 10:28:43 -------------------------------------------------------------------------------- Lower Extremity Assessment Details Patient Name: Date of Service: [name], [name] 06/05/2021 9:15 A M Medical Record Number: [id number] Patient Account Number: [id number] Date of Birth/Sex: Treating RN: June 11, 1951 (70 y.o. [name] Primary Care Ivi Griffith: [name] Other Clinician: Referring Mccrae Speciale: Treating Djeneba Barsch/Extender: [name], [name]: [phone] Electronic Signature(s) Signed: 06/05/2021 5:22:48 PM By: Lorrin Jackson Entered By: Lorrin Jackson on 06/05/2021 10:02:04 -------------------------------------------------------------------------------- Multi-Disciplinary Care Plan Details Patient Name: Date of Service: [name], [name] 06/05/2021 9:15 A M Medical Record Number: [id number] Patient Account Number: [id number] Date of Birth/Sex: Treating RN: Mar 05, 1951 (70 y.o. [name] Primary Care Marguis Mathieson: [name] Other Clinician: Referring Raymonda Pell: Treating Giuseppina Quinones/Extender: [name], [name] in Treatment: [place] reviewed with physician Active Inactive Wound/Skin Impairment Nursing Diagnoses: Impaired tissue integrity Knowledge deficit related to ulceration/compromised skin integrity Goals: Patient/caregiver will verbalize understanding of skin care regimen Date Initiated: 08/22/2020 Target Resolution Date: 07/03/2021 Goal Status: Active Ulcer/skin  breakdown will have a volume reduction of 30% by week 4 Date Initiated: 08/22/2020 Date Inactivated: 10/03/2020 Target Resolution Date: 09/19/2020 Goal Status: Met Ulcer/skin breakdown will have a volume reduction of 50% by week 8 Date Initiated: 06/05/2021 Target Resolution Date: 07/03/2021 Goal Status: Active Interventions: Assess patient/caregiver ability to obtain necessary supplies Assess patient/caregiver ability to perform ulcer/skin care regimen upon admission and as needed Assess ulceration(s) every visit Treatment Activities: Skin care regimen initiated : 08/22/2020 Topical wound management initiated : 08/22/2020 Notes: 06/05/21: Wound care regimen ongoing. Electronic Signature(s)  Signed: 06/05/2021 5:22:48 PM By: Lorrin Jackson Entered By: Lorrin Jackson on 06/05/2021 10:03:48 -------------------------------------------------------------------------------- Pain Assessment Details Patient Name: Date of Service: [name], [name] 06/05/2021 9:15 A M Medical Record Number: [id number] Patient Account Number: [id number] Date of Birth/Sex: Treating RN: [birth year] (70 y.o. [name] Primary Care Mikya Don: [name] Other Clinician: Referring Adela Esteban: Treating Macklyn Glandon/Extender: [name], [name]: [phone] Active Problems Location of Pain Severity and Description of Pain Patient Has Paino No Site Locations Pain Management and Medication Current Pain Management: Electronic Signature(s) Signed: 06/05/2021 5:35:19 PM By: Baruch Gouty RN, BSN Signed: 06/06/2021 7:44:17 AM By: Sandre Kitty Entered By: Sandre Kitty on 06/05/2021 09:58:06 -------------------------------------------------------------------------------- Patient/Caregiver Education Details Patient Name: Date of Service: [name], [name] 8/24/2022andnbsp9:15 A M Medical Record Number: [id number] Patient Account Number: [id number] Date of Birth/Gender: Treating  RN: 21-Aug-1951 (70 y.o. [name] Primary Care Physician: [name] Other Clinician: Referring Physician: Treating Physician/Extender: [name], [name] in Treatment: 86 Education Assessment Education Provided To: Patient Education Topics Provided Wound/Skin Impairment: Methods: Demonstration, Explain/Verbal, Printed Responses: State content correctly [employer]) Signed: 06/05/2021 5:22:48 PM By: Lorrin Jackson Entered By: Lorrin Jackson on 06/05/2021 10:04:03 -------------------------------------------------------------------------------- Wound Assessment Details Patient Name: Date of Service: [name], [place] [name] 06/05/2021 9:15 A M Medical Record Number: [id number] Patient Account Number: [id number] Date of Birth/Sex: Treating RN: 12/02/1950 (70 y.o. [name], [name] Primary Care Yamna Mackel: [name] Other Clinician: Referring Takari Lundahl: Treating Brooks Kinnan/Extender: [name], [name]: 41 Wound Status Wound Number: 1 Primary Soft Tissue Radionecrosis Etiology: Wound Location: Right Breast (mastectomy site) Wound Open Wounding Event: Radiation Burn Status: Date Acquired: 07/26/2020 Comorbid Anemia, Lymphedema, Deep Vein Thrombosis, Hypertension, Weeks Of Treatment: 41 History: Peripheral Venous Disease, Osteoarthritis, Received Clustered Wound: No Chemotherapy, Received Radiation, Confinement Anxiety Photos Wound Measurements Length: (cm) 1 Width: (cm) 1.4 Depth: (cm) 0.2 Area: (cm) 1.1 Volume: (cm) 0.22 % Reduction in Area: 28.2% % Reduction in Volume: -43.8% Epithelialization: None Tunneling: No Undermining: No Wound Description Classification: Full Thickness Without Exposed Support Structures Wound Margin: Distinct, outline attached Exudate Amount: Medium Exudate Type: Serous Exudate Color: amber Foul Odor After Cleansing: No Slough/Fibrino Yes Wound Bed Granulation  Amount: Small (1-33%) Exposed Structure Granulation Quality: Red, Pink Fascia Exposed: No Necrotic Amount: Medium (34-66%) Fat Layer (Subcutaneous Tissue) Exposed: Yes Necrotic Quality: Adherent Slough Tendon Exposed: No Muscle Exposed: No Joint Exposed: No Bone Exposed: No Treatment Notes Wound #1 (Breast (mastectomy site)) Wound Laterality: Right Cleanser Soap and Water Discharge Instruction: May shower and wash wound with dial antibacterial soap and water prior to dressing change. Peri-Wound Care AandD Ointment Discharge Instruction: to scar tissue daily Topical Primary Dressing KerraCel Ag Gelling Fiber Dressing, 2x2 in (silver alginate) Discharge Instruction: Apply silver alginate to wound bed as instructed Secondary Dressing Woven Gauze Sponge, Non-Sterile 4x4 in Discharge Instruction: Apply over primary dressing as directed. Secured With 66M [place] Surgical T ape, 2x2 (in/yd) Discharge Instruction: Secure dressing with tape as directed. Compression Wrap Compression Stockings Add-Ons Electronic Signature(s) Signed: 06/05/2021 5:22:48 PM By: Lorrin Jackson Signed: 06/05/2021 5:35:19 PM By: Baruch Gouty RN, BSN Entered By: Lorrin Jackson on 06/05/2021 10:02:46 -------------------------------------------------------------------------------- Vitals Details Patient Name: Date of Service: [name], [name] 06/05/2021 9:15 A M Medical Record Number: [id number] Patient Account Number: [id number] Date of Birth/Sex: Treating RN: 1951/09/20 (70 y.o. [name] Primary Care Kadien Lineman: [name] Other Clinician: Referring Alora Gorey: Treating Ramez Arrona/Extender: Worthy Keeler  [name] Weeks in Treatment: 41 Vital Signs Time Taken: 09:57 Temperature (F): 98.4 Height (in): 63 Pulse (bpm): 73 Weight (lbs): 176 Respiratory Rate (breaths/min): 20 Body Mass Index (BMI): 31.2 Blood Pressure (mmHg): 169/77 Reference Range: 80 - 120 mg /  dl Electronic Signature(s) Signed: 06/06/2021 7:44:17 AM By: Sandre Kitty Entered By: Sandre Kitty on 06/05/2021 09:58:00

## 2021-06-10 DIAGNOSIS — M6281 Muscle weakness (generalized): Secondary | ICD-10-CM | POA: Diagnosis not present

## 2021-06-10 DIAGNOSIS — R293 Abnormal posture: Secondary | ICD-10-CM | POA: Diagnosis not present

## 2021-06-10 DIAGNOSIS — M542 Cervicalgia: Secondary | ICD-10-CM | POA: Diagnosis not present

## 2021-06-10 DIAGNOSIS — M25512 Pain in left shoulder: Secondary | ICD-10-CM | POA: Diagnosis not present

## 2021-06-11 DIAGNOSIS — L602 Onychogryphosis: Secondary | ICD-10-CM | POA: Diagnosis not present

## 2021-06-13 DIAGNOSIS — R293 Abnormal posture: Secondary | ICD-10-CM | POA: Diagnosis not present

## 2021-06-13 DIAGNOSIS — M542 Cervicalgia: Secondary | ICD-10-CM | POA: Diagnosis not present

## 2021-06-13 DIAGNOSIS — M6281 Muscle weakness (generalized): Secondary | ICD-10-CM | POA: Diagnosis not present

## 2021-06-13 DIAGNOSIS — M25512 Pain in left shoulder: Secondary | ICD-10-CM | POA: Diagnosis not present

## 2021-06-18 DIAGNOSIS — M6281 Muscle weakness (generalized): Secondary | ICD-10-CM | POA: Diagnosis not present

## 2021-06-18 DIAGNOSIS — M542 Cervicalgia: Secondary | ICD-10-CM | POA: Diagnosis not present

## 2021-06-18 DIAGNOSIS — R293 Abnormal posture: Secondary | ICD-10-CM | POA: Diagnosis not present

## 2021-06-18 DIAGNOSIS — M25512 Pain in left shoulder: Secondary | ICD-10-CM | POA: Diagnosis not present

## 2021-06-20 DIAGNOSIS — R293 Abnormal posture: Secondary | ICD-10-CM | POA: Diagnosis not present

## 2021-06-20 DIAGNOSIS — M542 Cervicalgia: Secondary | ICD-10-CM | POA: Diagnosis not present

## 2021-06-20 DIAGNOSIS — M25512 Pain in left shoulder: Secondary | ICD-10-CM | POA: Diagnosis not present

## 2021-06-20 DIAGNOSIS — M6281 Muscle weakness (generalized): Secondary | ICD-10-CM | POA: Diagnosis not present

## 2021-06-25 ENCOUNTER — Other Ambulatory Visit: Payer: Self-pay

## 2021-06-25 ENCOUNTER — Telehealth: Payer: Self-pay | Admitting: *Deleted

## 2021-06-25 ENCOUNTER — Inpatient Hospital Stay: Payer: Medicare Other | Attending: Hematology & Oncology

## 2021-06-25 ENCOUNTER — Inpatient Hospital Stay (HOSPITAL_BASED_OUTPATIENT_CLINIC_OR_DEPARTMENT_OTHER): Payer: Medicare Other | Admitting: Hematology & Oncology

## 2021-06-25 ENCOUNTER — Encounter: Payer: Self-pay | Admitting: Hematology & Oncology

## 2021-06-25 VITALS — BP 148/65 | HR 85 | Temp 98.4°F | Resp 18 | Wt 185.0 lb

## 2021-06-25 DIAGNOSIS — E039 Hypothyroidism, unspecified: Secondary | ICD-10-CM

## 2021-06-25 DIAGNOSIS — Z79811 Long term (current) use of aromatase inhibitors: Secondary | ICD-10-CM | POA: Diagnosis not present

## 2021-06-25 DIAGNOSIS — C50911 Malignant neoplasm of unspecified site of right female breast: Secondary | ICD-10-CM | POA: Insufficient documentation

## 2021-06-25 DIAGNOSIS — Z7982 Long term (current) use of aspirin: Secondary | ICD-10-CM | POA: Diagnosis not present

## 2021-06-25 DIAGNOSIS — C50011 Malignant neoplasm of nipple and areola, right female breast: Secondary | ICD-10-CM | POA: Diagnosis not present

## 2021-06-25 DIAGNOSIS — D638 Anemia in other chronic diseases classified elsewhere: Secondary | ICD-10-CM | POA: Diagnosis not present

## 2021-06-25 DIAGNOSIS — M25461 Effusion, right knee: Secondary | ICD-10-CM | POA: Diagnosis not present

## 2021-06-25 DIAGNOSIS — Z923 Personal history of irradiation: Secondary | ICD-10-CM | POA: Diagnosis not present

## 2021-06-25 DIAGNOSIS — D509 Iron deficiency anemia, unspecified: Secondary | ICD-10-CM | POA: Diagnosis not present

## 2021-06-25 DIAGNOSIS — M818 Other osteoporosis without current pathological fracture: Secondary | ICD-10-CM | POA: Diagnosis not present

## 2021-06-25 LAB — CMP (CANCER CENTER ONLY)
ALT: 9 U/L (ref 0–44)
AST: 12 U/L — ABNORMAL LOW (ref 15–41)
Albumin: 4 g/dL (ref 3.5–5.0)
Alkaline Phosphatase: 60 U/L (ref 38–126)
Anion gap: 8 (ref 5–15)
BUN: 20 mg/dL (ref 8–23)
CO2: 31 mmol/L (ref 22–32)
Calcium: 9.3 mg/dL (ref 8.9–10.3)
Chloride: 104 mmol/L (ref 98–111)
Creatinine: 0.64 mg/dL (ref 0.44–1.00)
GFR, Estimated: >60 mL/min (ref >60)
Glucose, Bld: 104 mg/dL — ABNORMAL HIGH (ref 70–99)
Potassium: 3.4 mmol/L — ABNORMAL LOW (ref 3.5–5.1)
Sodium: 143 mmol/L (ref 135–145)
Total Bilirubin: 0.6 mg/dL (ref 0.3–1.2)
Total Protein: 7.1 g/dL (ref 6.5–8.1)

## 2021-06-25 LAB — CBC WITH DIFFERENTIAL (CANCER CENTER ONLY)
Abs Immature Granulocytes: 0.02 10*3/uL (ref 0.00–0.07)
Basophils Absolute: 0 10*3/uL (ref 0.0–0.1)
Basophils Relative: 0 %
Eosinophils Absolute: 0.2 10*3/uL (ref 0.0–0.5)
Eosinophils Relative: 3 %
HCT: 33.1 % — ABNORMAL LOW (ref 36.0–46.0)
Hemoglobin: 10.5 g/dL — ABNORMAL LOW (ref 12.0–15.0)
Immature Granulocytes: 0 %
Lymphocytes Relative: 24 %
Lymphs Abs: 1.6 10*3/uL (ref 0.7–4.0)
MCH: 29.1 pg (ref 26.0–34.0)
MCHC: 31.7 g/dL (ref 30.0–36.0)
MCV: 91.7 fL (ref 80.0–100.0)
Monocytes Absolute: 0.6 10*3/uL (ref 0.1–1.0)
Monocytes Relative: 9 %
Neutro Abs: 4.3 10*3/uL (ref 1.7–7.7)
Neutrophils Relative %: 64 %
Platelet Count: 195 10*3/uL (ref 150–400)
RBC: 3.61 MIL/uL — ABNORMAL LOW (ref 3.87–5.11)
RDW: 13.9 % (ref 11.5–15.5)
WBC Count: 6.8 10*3/uL (ref 4.0–10.5)
nRBC: 0 % (ref 0.0–0.2)

## 2021-06-25 LAB — LACTATE DEHYDROGENASE: LDH: 182 U/L (ref 98–192)

## 2021-06-25 LAB — TSH: TSH: 2.517 u[IU]/mL (ref 0.350–4.500)

## 2021-06-25 LAB — VITAMIN D 25 HYDROXY (VIT D DEFICIENCY, FRACTURES): Vit D, 25-Hydroxy: 72.53 ng/mL (ref 30–100)

## 2021-06-25 MED ORDER — TRAMADOL HCL 50 MG PO TABS: 50.0000 mg | ORAL_TABLET | Freq: Four times a day (QID) | ORAL | 0 refills | Status: DC | PRN

## 2021-06-25 NOTE — Telephone Encounter (Signed)
Per 06/25/21 los gave upcoming appointments - confirmed - print calendar

## 2021-06-25 NOTE — Progress Notes (Signed)
Hematology and Oncology Follow Up Visit  [name] ES:[id number] Jun 03, 1951 70 y.o. 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 Aromasin 25 mg p.o. daily - on hold start on 03/18/2021 2. Aspirin 81 mg p.o. daily 3.  Feraheme-510 mg IV as needed.  Last dose given on 05/07/2021  Interim History:  [name] is here today for follow-up.  She is still bothered by the arthritis.  She really needs to see a rheumatologist.  Hopefully, she will be able to get to see 1.  [facility] know she is taking some physical therapy.  Her family doctor is doing a great job in trying to manage the arthritis.  I will go ahead and see if she will respond to some Ultram.  I think this would not be a bad idea.  I will send in Ultram at 50 mg p.o. every 6 hours as needed.  Otherwise, she seems to be managing.  She has little bit of anemia which is stable.  Her last iron studies showed a ferritin of 514 with an iron saturation of 18%.  She did going get some Feraheme back in July.  She is little bit of swelling with the right knee.  Her neck is suffering.  I think she had a plain films or CT scan.  She has significant degenerative changes.  She has spurs.  Again, I suspect that she has rheumatoid arthritis as her big problem.  I think the Aromasin is doing okay for her.  She is on vitamin D.  Overall, her performance status is ECOG 1.     Medications:  Allergies as of 06/25/2021       Reactions   Contrast Media [iodinated Diagnostic Agents] Hives, Shortness Of Breath, Nausea And Vomiting   Allergic reaction even after pre-meds.   Metrizamide Hives, Shortness Of Breath, Nausea And Vomiting   Allergic reaction even after pre-meds.   Other Shortness Of Breath, Nausea And Vomiting, Rash   Allergic reaction even after pre-meds.   Dye Fdc Blue [brilliant Blue Fcf  (fd&c Blue #1)] Hives, Nausea Only   Fd&c Blue #1 (brilliant Blue Fcf) Hives, Nausea Only        Medication List        Accurate as of June 25, 2021 11:24 AM. If you have any questions, ask your nurse or doctor.          amLODipine 10 MG tablet Commonly known as: NORVASC Take 10 mg by mouth daily.   aspirin EC 81 MG tablet Take 81 mg by mouth daily.   candesartan 32 MG tablet Commonly known as: ATACAND Take 32 mg by mouth daily.   exemestane 25 MG tablet Commonly known as: AROMASIN TAKE 1 TABLET BY MOUTH EVERY DAY   hydrochlorothiazide 25 MG tablet Commonly known as: HYDRODIURIL Take 25 mg by mouth daily.   ibuprofen 200 MG tablet Commonly known as: ADVIL Take 200 mg by mouth every 6 (six) hours as needed.   meloxicam 7.5 MG tablet Commonly known as: MOBIC Take 7.5 mg by mouth daily as needed.   methylPREDNISolone 4 MG Tbpk tablet Commonly known as: MEDROL DOSEPAK Take as directed   metoprolol succinate 50 MG 24 hr tablet Commonly known as: TOPROL-XL Take 50 mg by mouth daily.   simvastatin 20 MG tablet Commonly known as: ZOCOR Take 20  mg by mouth daily.   traMADol 50 MG tablet Commonly known as: ULTRAM Take 1 tablet (50 mg total) by mouth every 6 (six) hours as needed. Started by: [name], MD   Vitamin D (Ergocalciferol) 1.25 MG (50000 UNIT) Caps capsule Commonly known as: DRISDOL TAKE 1 CAPSULE BY MOUTH ONE TIME PER WEEK        Allergies:  Allergies  Allergen Reactions   Contrast Media [Iodinated Diagnostic Agents] Hives, Shortness Of Breath and Nausea And Vomiting    Allergic reaction even after pre-meds.   Metrizamide Hives, Shortness Of Breath and Nausea And Vomiting    Allergic reaction even after pre-meds.   Other Shortness Of Breath, Nausea And Vomiting and Rash    Allergic reaction even after pre-meds.   Dye Fdc Blue [Brilliant Blue Fcf (Fd&C Blue #1)] Hives and Nausea Only   Fd&C Blue #1 (Brilliant Blue Fcf) Hives  and Nausea Only    Past Medical History, Surgical history, Social history, and Family History were reviewed and updated.  Review of Systems: Review of Systems  Constitutional: Negative.   HENT:  Positive for hearing loss.   Eyes:  Positive for discharge.  Respiratory: Negative.    Cardiovascular: Negative.   Gastrointestinal: Negative.   Genitourinary: Negative.   Musculoskeletal: Negative.   Skin: Negative.   Neurological: Negative.   Endo/Heme/Allergies: Negative.   Psychiatric/Behavioral: Negative.      Physical Exam:  weight is 185 lb (83.9 kg). Her oral temperature is 98.4 F (36.9 C). Her blood pressure is 148/65 (abnormal) and her pulse is 85. Her respiration is 18 and oxygen saturation is 99%.   Wt Readings from Last 3 Encounters:  06/25/21 185 lb (83.9 kg)  05/07/21 181 lb 0.5 oz (82.1 kg)  04/29/21 185 lb (83.9 kg)   Physical Exam Vitals reviewed.  Constitutional:      Comments: On her breast exam, her left breast is without any masses, edema or erythema.  There is no left axillary adenopathy.  There is no discharge from the nipple on the left breast.  Right chest wall shows radiation dermatitis which is about the same.  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  There is no obvious exudate.  There is no tenderness.  There is no right axillary adenopathy.  HENT:     Head: Normocephalic and atraumatic.  Eyes:     Pupils: Pupils are equal, round, and reactive to light.     Comments: Her ocular exam does show this stye on the upper eyelid of the left eye.  There is an area of purulent material that looks like he might be coming out soon.  She has good extraocular muscle movement.  Pupils react appropriately.  Cardiovascular:     Rate and Rhythm: Normal rate and regular rhythm.     Heart sounds: Normal heart sounds.  Pulmonary:     Effort: Pulmonary effort is normal.     Breath sounds: Normal breath sounds.  Abdominal:     General: Bowel sounds  are normal.     Palpations: Abdomen is soft.  Musculoskeletal:        General: No tenderness or deformity. Normal range of motion.     Cervical back: Normal range of motion.  Lymphadenopathy:     Cervical: No cervical adenopathy.  Skin:    General: Skin is warm and dry.     Findings: No erythema or rash.  Neurological:     Mental Status: She is  alert and oriented to person, place, and time.  Psychiatric:        Behavior: Behavior normal.        Thought Content: Thought content normal.        Judgment: Judgment normal.      Lab Results  Component Value Date   WBC 6.8 06/25/2021   HGB 10.5 (L) 06/25/2021   HCT 33.1 (L) 06/25/2021   MCV 91.7 06/25/2021   PLT 195 06/25/2021   Lab Results  Component Value Date   FERRITIN 514 (H) 04/29/2021   IRON 36 (L) 04/29/2021   TIBC 204 (L) 04/29/2021   UIBC 168 04/29/2021   IRONPCTSAT 18 (L) 04/29/2021   Lab Results  Component Value Date   RETICCTPCT 1.6 04/29/2021   RBC 3.61 (L) 06/25/2021   RETICCTABS 55.2 06/20/2011   No results found for: KPAFRELGTCHN, LAMBDASER, KAPLAMBRATIO No results found for: Osborne Casco Lab Results  Component Value Date   TOTALPROTELP 6.6 08/14/2010   ALBUMINELP 53.8 (L) 08/14/2010   A1GS 5.3 (H) 08/14/2010   A2GS 14.3 (H) 08/14/2010   BETS 5.8 08/14/2010   BETA2SER 6.3 08/14/2010   GAMS 14.5 08/14/2010   MSPIKE NOT DET 08/14/2010   SPEI * 08/14/2010     Chemistry      Component Value Date/Time   NA 143 06/25/2021 1001   NA 146 (H) 07/23/2017 0900   NA 144 02/18/2017 0901   K 3.4 (L) 06/25/2021 1001   K 3.7 07/23/2017 0900   K 3.7 02/18/2017 0901   CL 104 06/25/2021 1001   CL 107 07/23/2017 0900   CO2 31 06/25/2021 1001   CO2 31 07/23/2017 0900   CO2 27 02/18/2017 0901   BUN 20 06/25/2021 1001   BUN 16 07/23/2017 0900   BUN 17.7 02/18/2017 0901   CREATININE 0.64 06/25/2021 1001   CREATININE 0.8 07/23/2017 0900   CREATININE 0.7 02/18/2017 0901      Component Value  Date/Time   CALCIUM 9.3 06/25/2021 1001   CALCIUM 9.3 07/23/2017 0900   CALCIUM 9.3 02/18/2017 0901   ALKPHOS 60 06/25/2021 1001   ALKPHOS 67 07/23/2017 0900   ALKPHOS 78 02/18/2017 0901   AST 12 (L) 06/25/2021 1001   AST 13 02/18/2017 0901   ALT 9 06/25/2021 1001   ALT 20 07/23/2017 0900   ALT 12 02/18/2017 0901   BILITOT 0.6 06/25/2021 1001   BILITOT 0.54 02/18/2017 0901       Impression and Plan: [name] is a very pleasant 70 yo caucasian female with history of locally recurrent adenocarcinoma of the right breast with resection and radiation.    I think that she probably has some type of inflammatory arthropathy causing her problems.  Again, I do not think this is from the Aromasin.  Hopefully, the Ultram will help her a little bit.  I am surprised that her anemia is not improved.  I think this might be anemia of chronic disease because of the underlying arthritis.  She has a quite elevated ferritin which would be indicative of inflammatory state.  We will plan to get her back probably in November.  Hopefully, by then, she will be feeling a little bit better.     [name], MD 9/13/202211:24 AM

## 2021-06-27 DIAGNOSIS — M6281 Muscle weakness (generalized): Secondary | ICD-10-CM | POA: Diagnosis not present

## 2021-06-27 DIAGNOSIS — M25512 Pain in left shoulder: Secondary | ICD-10-CM | POA: Diagnosis not present

## 2021-06-27 DIAGNOSIS — M542 Cervicalgia: Secondary | ICD-10-CM | POA: Diagnosis not present

## 2021-06-27 DIAGNOSIS — R293 Abnormal posture: Secondary | ICD-10-CM | POA: Diagnosis not present

## 2021-07-02 DIAGNOSIS — M6281 Muscle weakness (generalized): Secondary | ICD-10-CM | POA: Diagnosis not present

## 2021-07-02 DIAGNOSIS — M25512 Pain in left shoulder: Secondary | ICD-10-CM | POA: Diagnosis not present

## 2021-07-02 DIAGNOSIS — M542 Cervicalgia: Secondary | ICD-10-CM | POA: Diagnosis not present

## 2021-07-02 DIAGNOSIS — R293 Abnormal posture: Secondary | ICD-10-CM | POA: Diagnosis not present

## 2021-07-03 ENCOUNTER — Ambulatory Visit: Payer: Medicare Other

## 2021-07-04 DIAGNOSIS — M25512 Pain in left shoulder: Secondary | ICD-10-CM | POA: Diagnosis not present

## 2021-07-04 DIAGNOSIS — R293 Abnormal posture: Secondary | ICD-10-CM | POA: Diagnosis not present

## 2021-07-04 DIAGNOSIS — M6281 Muscle weakness (generalized): Secondary | ICD-10-CM | POA: Diagnosis not present

## 2021-07-04 DIAGNOSIS — M542 Cervicalgia: Secondary | ICD-10-CM | POA: Diagnosis not present

## 2021-07-09 DIAGNOSIS — M542 Cervicalgia: Secondary | ICD-10-CM | POA: Diagnosis not present

## 2021-07-09 DIAGNOSIS — M25512 Pain in left shoulder: Secondary | ICD-10-CM | POA: Diagnosis not present

## 2021-07-09 DIAGNOSIS — M6281 Muscle weakness (generalized): Secondary | ICD-10-CM | POA: Diagnosis not present

## 2021-07-09 DIAGNOSIS — R293 Abnormal posture: Secondary | ICD-10-CM | POA: Diagnosis not present

## 2021-07-10 ENCOUNTER — Other Ambulatory Visit: Payer: Self-pay

## 2021-07-10 ENCOUNTER — Encounter (HOSPITAL_BASED_OUTPATIENT_CLINIC_OR_DEPARTMENT_OTHER): Payer: Medicare Other | Attending: Physician Assistant | Admitting: Physician Assistant

## 2021-07-10 DIAGNOSIS — Z5111 Encounter for antineoplastic chemotherapy: Secondary | ICD-10-CM | POA: Insufficient documentation

## 2021-07-10 DIAGNOSIS — L598 Other specified disorders of the skin and subcutaneous tissue related to radiation: Secondary | ICD-10-CM | POA: Insufficient documentation

## 2021-07-10 DIAGNOSIS — L98492 Non-pressure chronic ulcer of skin of other sites with fat layer exposed: Secondary | ICD-10-CM | POA: Diagnosis not present

## 2021-07-10 DIAGNOSIS — Z9011 Acquired absence of right breast and nipple: Secondary | ICD-10-CM | POA: Diagnosis not present

## 2021-07-10 DIAGNOSIS — N61 Mastitis without abscess: Secondary | ICD-10-CM | POA: Diagnosis not present

## 2021-07-10 DIAGNOSIS — I890 Lymphedema, not elsewhere classified: Secondary | ICD-10-CM | POA: Diagnosis not present

## 2021-07-10 DIAGNOSIS — Z86718 Personal history of other venous thrombosis and embolism: Secondary | ICD-10-CM | POA: Diagnosis not present

## 2021-07-10 DIAGNOSIS — L589 Radiodermatitis, unspecified: Secondary | ICD-10-CM | POA: Diagnosis not present

## 2021-07-10 DIAGNOSIS — N641 Fat necrosis of breast: Secondary | ICD-10-CM | POA: Diagnosis not present

## 2021-07-10 DIAGNOSIS — Z853 Personal history of malignant neoplasm of breast: Secondary | ICD-10-CM | POA: Insufficient documentation

## 2021-07-10 DIAGNOSIS — Z9221 Personal history of antineoplastic chemotherapy: Secondary | ICD-10-CM | POA: Diagnosis not present

## 2021-07-10 DIAGNOSIS — A499 Bacterial infection, unspecified: Secondary | ICD-10-CM | POA: Diagnosis not present

## 2021-07-10 DIAGNOSIS — I10 Essential (primary) hypertension: Secondary | ICD-10-CM | POA: Insufficient documentation

## 2021-07-10 DIAGNOSIS — M1990 Unspecified osteoarthritis, unspecified site: Secondary | ICD-10-CM | POA: Diagnosis not present

## 2021-07-10 NOTE — Progress Notes (Addendum)
HADLI, VANDEMARK ([id number]) Visit Report for 07/10/2021 Biopsy Details Patient Name: Date of Service: [name] 07/10/2021 9:30 A M Medical Record Number: [id number] Patient Account Number: [id number] Date of Birth/Sex: Treating RN: Jan 29, 1951 (70 y.o. [name] Primary Care Provider: [name] Other Clinician: Referring Provider: Treating Provider/Extender: [name], [name]: 46 Biopsy Performed for: Wound #1 Right Breast (mastectomy site) Location(s): Wound Bed Performed By: Physician Worthy Keeler, PA Tissue Punch: Yes Size (mm): 4 Number of Specimens T aken: 1 Specimen Sent T Pathology: o Yes Level of Consciousness (Pre-procedure): Awake and Alert Pre-procedure Verification/Time-Out Taken: Yes - 10:45 Pain Control: Lidocaine Injectable Lidocaine Percent: 1% Instrument: Forceps, Scissors Bleeding: Moderate Hemostasis Achieved: Silver Nitrate Procedural Pain: 0 Post Procedural Pain: 0 Response to Treatment: Procedure was tolerated well Level of Consciousness (Post-procedure): Awake and Alert Post Procedure Diagnosis Same as Pre-procedure Electronic Signature(s) Signed: 07/10/2021 5:54:54 PM By: [name] RN, BSN Signed: 07/12/2021 4:42:31 PM By: Worthy Keeler PA-C Entered By: [name] on 07/10/2021 17:54:10 -------------------------------------------------------------------------------- Chief Complaint Document Details Patient Name: Date of Service: [name], [name] 07/10/2021 9:30 A M Medical Record Number: [id number] Patient Account Number: [id number] Date of Birth/Sex: Treating RN: 10-Oct-1951 (70 y.o. 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 [name], [name] Primary Care Provider: [name] Other Clinician: Referring Provider: Treating Provider/Extender: [name], [name] in Treatment: 46 Debridement Performed for Assessment: Wound #1 Right Breast (mastectomy site) Performed By: Physician Worthy Keeler, PA Debridement Type: Debridement Level of Consciousness (Pre-procedure): Awake and Alert Pre-procedure Verification/Time Out Yes - 10:40 Taken: Start Time: 10:41 Pain Control: Lidocaine 4% T opical Solution T Area Debrided (L x W): otal 1.7 (cm) x 3 (cm) = 5.1 (cm) Tissue and other material debrided: Non-Viable, Fat, Slough, Subcutaneous, Skin: Dermis , Skin: Epidermis, Fibrin/Exudate, Slough Level: Skin/Subcutaneous Tissue Debridement Description: Excisional Instrument: Curette Specimen: Swab, Number of Specimens T aken: 1 Bleeding: Minimum Hemostasis Achieved: Pressure End Time: 10:45 Procedural Pain: 1 Post Procedural Pain: 1 Response to Treatment: Procedure was tolerated well Level of Consciousness (Post- Awake and Alert procedure): Post Debridement Measurements of Total Wound Length: (cm) 1.7 Width: (cm) 3 Depth: (cm) 0.2 Volume: (cm) 0.801 Character of Wound/Ulcer Post Debridement: Requires Further Debridement Post Procedure Diagnosis Same as Pre-procedure Electronic Signature(s) Signed: 07/10/2021 5:54:54 PM By: [name] RN, BSN Signed: 07/12/2021 4:42:31 PM By: Worthy Keeler PA-C Entered By: [name] on 07/10/2021  17:52:54 -------------------------------------------------------------------------------- HPI Details Patient Name: Date of Service: [name], [name] 07/10/2021 9:30 A M Medical Record Number: [id number] Patient Account Number: [id number] Date of Birth/Sex: Treating RN: 05-02-1951 (70 y.o. [name] Primary Care Provider: [name] Other Clinician: Referring Provider: Treating Provider/Extender: [name], [name]: 46 History of Present Illness HPI Description: 08/22/2020 upon evaluation today patient actually appears to be doing somewhat poorly in regard to her mastectomy site on the right chest wall. She had a mastectomy initially in 1998. She had  chemotherapy only no radiation following. In 2002 she subsequently did undergo radiation for the first time and then subsequently in 2012 had repeat radiation after having had a finding that was consistent with a relapse of the cancer. 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 She also has radiation damage to the skin over the right chest wall where she had the mastectomy. She was referred to [place] by Dr. Matthew Saras in [facility] and her oncologist is Dr. Burney Gauze. Subsequently I want to make sure before we delve into this more deeply that the patient does not have any issues with a return of cancer that needs to be managed by them. 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 Nonetheless she does not remember exactly when her last PET scan was but it was not too recently she tells me. 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 The patient does have hypertension as well. She sees her oncologist next on December 6. 09/12/2020 on evaluation today patient actually appears to be doing  excellent in regard to her wound under the right breast location. Currently this is measuring smaller in general seems to be doing very well. She tells me that the collagen is doing a good job and that the dressing that she has been putting on is not causing any troubles as far pulling on her skin or scar tissue is concerned all of which is excellent news. 10/03/2020 upon evaluation today patient appears to be doing well with regard to her surgical site from her mastectomy on the right breast region. She tells me that she has had very little bleeding nothing seems to be sticking badly and in general she has been extremely pleased with where things stand today. No fevers, chills, nausea, vomiting, or diarrhea. 10/24/2020 upon evaluation today patient actually appears to be doing excellent in regard to her wound. There is just a very small area still open and to be honest there was minimal slough noted on the surface of the wound nothing that requires sharp debridement. In general I feel like she is actually doing quite well and overall I am pleased. I do think we may switch to silver alginate dressing and also contemplating using a AandE ointment in order to help keep everything moist and the scar tissue region while the alginate keeps it dry enough to heal 11/14/2020 upon evaluation today patient appears to be doing well at this point in regard to her wound. I do feel like that she is making good progress again this is a very difficult region over the surgical site of the right chest wall at the site of mastectomy. Nonetheless I think that we are just a very small area still open I think alginate is doing a good job helping to dry this up and I would recommend this such that we continue with this. 01/02/2021 on evaluation today patient's wound actually appears to be doing decently well. There does not appear to be any signs of active infection which is great news. She does have some slight slough buildup  with biofilm on the surface of the wound mainly more biofilm. Nonetheless I was able to gently remove this with saline and gauze as well as a sterile Q-tip. She tolerated that today without complication. 01/30/2021 upon evaluation today  patient appears to be doing well with regard to her wound although she still has quite a bit of trouble getting this to close I think the scar tissue and radiation damage is quite significant here unfortunately. There does not appear to be any signs of active infection which is great news. No fevers, chills, nausea, vomiting, or diarrhea. 02/27/2021 upon evaluation today patient appears to be doing excellent in regard to her wound. She has been tolerating the dressing changes without complication and in general I am extremely pleased with where things stand today. There does not appear to be any signs of infection which is great news. No fevers, chills, nausea, vomiting, or diarrhea. With that being said I do think that she still developing some slough buildup. I did discuss with her today the possibility of proceeding with HBO therapy. We have had this discussion before but she really is not quite committed to wanting to do that much and spend that much time in the chamber. She wants to still think about it. 03/27/2021 upon evaluation today patient appears to be doing about the same in regard to her wound. She still developing a lot of slough buildup on the surface of the wound. I did try to clean that away to some degree today. She tolerated that without any pain or complication. Subsequently I am thinking we may want to switch over to [facility] see if this may do better for her. Patient is in agreement with giving that a trial. 05/01/2021 upon evaluation today patient appears to be doing about the same in regard to her wounds. She has been tolerating the dressing changes without complication. Fortunately there does not appear to be any signs of active infection at this time  which is great news. No fevers, chills, nausea, vomiting, or diarrhea.. 06/05/2021 upon evaluation today patient appears to be doing pretty well currently in regard to her wound at the mastectomy site right chest wall. Overall since we switch back to the alginate she tells me things have significantly improved she is much happier with where things stand currently. Fortunately there does not appear to be any signs of active infection which is great news. No fevers, chills, nausea, vomiting, or diarrhea. 07/10/2021 upon evaluation today patient unfortunately does not appear to be doing nearly as well as what she previously was. There does not appear to be any evidence of new epithelial growth she has a lot of necrotic tissue the base of the wound this is much more significant than what we previously noted. She also has been having increased pain and increased drainage. In general I am very concerned about this especially in light of the fact that she does have a history of breast cancer I want to ensure that were not looking at any cancerous type lesion at this point. Otherwise also think she does need some debridement we did do MolecuLight scanning today. Electronic Signature(s) Signed: 07/12/2021 4:06:41 PM By: Worthy Keeler PA-C Entered By: Worthy Keeler on 07/12/2021 16:06:40 -------------------------------------------------------------------------------- Physical Exam Details Patient Name: Date of Service: [name], [name] 07/10/2021 9:30 A M Medical Record Number: [id number] Patient Account Number: [id number] Date of Birth/Sex: Treating RN: [birth year] (70 y.o. [name] Primary Care Provider: [name] Other Clinician: Referring Provider: Treating Provider/Extender: [name], [name]: 105 Constitutional [name] and well-hydrated in no acute distress. Respiratory normal breathing without difficulty. Psychiatric this patient is able  to make decisions and demonstrates good insight into disease  process. Alert and Oriented x 3. pleasant and cooperative. Notes Upon inspection patient's wound bed actually showed signs of significant necrotic tissue in the base of the wound actually did perform sharp debridement to clear away this necrotic debris is much as possible the wound was significantly deeper postdebridement although I did have some bleeding noted so we are down to some good tissue there was a lot of scarring and a lot of calcification as well again this is a radiation area where there is definite damage as well. I also obtained a punch biopsy sample from the perimeter of the wound including the margin of the wound bed in order to send this for evaluation and see if there was any evidence of malignancy as that could obviously complicate the situation if not I think the patient may be a candidate for consideration of hyperbaric oxygen therapy. We have previously discussed this but she really was not interested at that point. Electronic Signature(s) Signed: 07/12/2021 4:08:11 PM By: Worthy Keeler PA-C Entered By: Worthy Keeler on 07/12/2021 16:08:11 -------------------------------------------------------------------------------- Physician Orders Details Patient Name: Date of Service: [name], [name] 07/10/2021 9:30 A M Medical Record Number: [id number] Patient Account Number: [id number] Date of Birth/Sex: Treating RN: 06/09/1951 (70 y.o. [name] Primary Care Provider: [name] Other Clinician: Referring Provider: Treating Provider/Extender: [name], [name] in Treatment: [phone] Verbal / Phone Orders: No Diagnosis Coding ICD-10 Coding Code Description [phone] Non-pressure chronic ulcer of skin of other sites with fat layer exposed L58.9 Radiodermatitis, unspecified L59.8 Other specified disorders of the skin and subcutaneous tissue related to radiation I10 Essential (primary)  hypertension Follow-up Appointments ppointment in 1 week. Jeri Cos PA Return A Pick up Dakin's solution at pharmacy and oral antibiotics. Bathing/ Shower/ Hygiene May shower and wash wound with soap and water. - on days that dressing is changed Additional Orders / Instructions Follow Nutritious Diet Wound Treatment Wound #1 - Breast (mastectomy site) Wound Laterality: Right Cleanser: Soap and Water 1 x Per Day/30 Days Discharge Instructions: May shower and wash wound with dial antibacterial soap and water prior to dressing change. Prim Dressing: Dakin's solution 1 x Per Day/30 Days ary Discharge Instructions: apply dakin's wet to dry dressing. Secondary Dressing: Woven Gauze Sponges 2x2 in (DME) (Generic) 1 x Per Day/30 Days Discharge Instructions: Apply over primary dressing as directed. Secondary Dressing: ABD Pad, 8x10 (DME) (Generic) 1 x Per Day/30 Days Discharge Instructions: Apply over primary dressing as directed. Secured With: 31M Medipore H Soft Cloth Surgical Tape, 2x2 (in/yd) (DME) (Generic) 1 x Per Day/30 Days Discharge Instructions: Secure dressing with tape as directed. Laboratory naerobe culture (MICRO) - culture of right breast mastectomy site wound. Bacteria identified in Unspecified specimen by A LOINC Code: 478-2 Convenience Name: Anerobic culture Bacteria identified in Tissue by Biopsy culture (MICRO) - punch biopsy right breast mastectomy site wound. LOINC Code: [phone] Convenience Name: Biopsy specimen culture Patient Medications llergies: Iodinated Contrast Media A Notifications Medication Indication Start End 07/10/2021 doxycycline hyclate DOSE 1 - oral 100 mg capsule - 1 capsule oral taken 2 times per day for 14 days 07/10/2021 Dakin's Solution DOSE miscellaneous 0.25 % solution - Moisten gauze with Dakin's solution then wring out leaving gauze damp not saturated before packing daily into the wound as directed in clini Electronic  Signature(s) Signed: 07/10/2021 4:24:55 PM By: Worthy Keeler PA-C Entered By: Worthy Keeler on 07/10/2021 16:24:54 -------------------------------------------------------------------------------- Problem List Details Patient Name: Date of Service: [name],  [name] 07/10/2021 9:30 A M Medical Record Number: [id number] Patient Account Number: [id number] Date of Birth/Sex: Treating RN: [birth year] (70 y.o. [name] Primary Care Provider: [name] Other Clinician: Referring Provider: Treating Provider/Extender: [name], [name]: 46 Active Problems ICD-10 Encounter Code Description Active Date MDM Diagnosis [phone] Non-pressure chronic ulcer of skin of other sites with fat layer exposed 08/22/2020 No Yes L58.9 Radiodermatitis, unspecified 08/22/2020 No Yes L59.8 Other specified disorders of the skin and subcutaneous tissue related to 08/22/2020 No Yes radiation I10 Essential (primary) hypertension 08/22/2020 No Yes Inactive Problems Resolved Problems Electronic Signature(s) Signed: 07/10/2021 9:37:48 AM By: Worthy Keeler PA-C Entered By: Worthy Keeler on 07/10/2021 09:37:48 -------------------------------------------------------------------------------- Progress Note Details Patient Name: Date of Service: [name], [name] 07/10/2021 9:30 A M Medical Record Number: [id number] Patient Account Number: [id number] Date of Birth/Sex: Treating RN: Feb 15, 1951 (70 y.o. 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 She had a mastectomy initially in 1998. She had chemotherapy only no radiation following. In 2002 she subsequently did undergo radiation for the first time and then subsequently in 2012 had repeat radiation after having had a finding that was consistent with a relapse of the cancer. 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 She also has radiation damage to the skin over the right chest wall where she had the mastectomy. She was referred to [place] by Dr. Matthew Saras in [facility] and her oncologist is Dr. Burney Gauze. Subsequently I want to make sure before we delve into this more deeply that the patient does not have any issues with a return of cancer that needs to be managed by them. 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 Nonetheless she does not remember exactly when her last PET scan was but it was not too recently she tells me. 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 The patient does have hypertension as well. She sees her oncologist next on December 6. 09/12/2020 on evaluation today patient actually appears to be doing excellent in regard to her wound under the right breast location. Currently this is measuring smaller in general seems to be doing very well. She tells me that the collagen is doing a good job and that the dressing that she has been putting on is not causing any troubles as far pulling on her skin or scar tissue is concerned all of which is excellent news. 10/03/2020 upon evaluation today patient appears to be doing well with regard to her surgical site from her mastectomy on the right breast region. She tells me that she has had very little bleeding nothing seems to be sticking badly and in general she has been extremely pleased with where things stand today.  No fevers, chills, nausea, vomiting, or diarrhea. 10/24/2020 upon evaluation today patient actually appears to be doing excellent in regard to her wound. There is just a very small area still open and to be honest  there was minimal slough noted on the surface of the wound nothing that requires sharp debridement. In general I feel like she is actually doing quite well and overall I am pleased. I do think we may switch to silver alginate dressing and also contemplating using a AandE ointment in order to help keep everything moist and the scar tissue region while the alginate keeps it dry enough to heal 11/14/2020 upon evaluation today patient appears to be doing well at this point in regard to her wound. I do feel like that she is making good progress again this is a very difficult region over the surgical site of the right chest wall at the site of mastectomy. Nonetheless I think that we are just a very small area still open I think alginate is doing a good job helping to dry this up and I would recommend this such that we continue with this. 01/02/2021 on evaluation today patient's wound actually appears to be doing decently well. There does not appear to be any signs of active infection which is great news. She does have some slight slough buildup with biofilm on the surface of the wound mainly more biofilm. Nonetheless I was able to gently remove this with saline and gauze as well as a sterile Q-tip. She tolerated that today without complication. 01/30/2021 upon evaluation today patient appears to be doing well with regard to her wound although she still has quite a bit of trouble getting this to close I think the scar tissue and radiation damage is quite significant here unfortunately. There does not appear to be any signs of active infection which is great news. No fevers, chills, nausea, vomiting, or diarrhea. 02/27/2021 upon evaluation today patient appears to be doing excellent in regard to her  wound. She has been tolerating the dressing changes without complication and in general I am extremely pleased with where things stand today. There does not appear to be any signs of infection which is great news. No fevers, chills, nausea, vomiting, or diarrhea. With that being said I do think that she still developing some slough buildup. I did discuss with her today the possibility of proceeding with HBO therapy. We have had this discussion before but she really is not quite committed to wanting to do that much and spend that much time in the chamber. She wants to still think about it. 03/27/2021 upon evaluation today patient appears to be doing about the same in regard to her wound. She still developing a lot of slough buildup on the surface of the wound. I did try to clean that away to some degree today. She tolerated that without any pain or complication. Subsequently I am thinking we may want to switch over to [facility] see if this may do better for her. Patient is in agreement with giving that a trial. 05/01/2021 upon evaluation today patient appears to be doing about the same in regard to her wounds. She has been tolerating the dressing changes without complication. Fortunately there does not appear to be any signs of active infection at this time which is great news. No fevers, chills, nausea, vomiting, or diarrhea.. 06/05/2021 upon evaluation today patient appears to be doing pretty well currently in regard to her wound at the mastectomy site right chest wall. Overall since we switch back to the alginate she tells me things have significantly improved she is much happier with where things stand currently. Fortunately there does not appear to be any signs of active infection which  is great news. No fevers, chills, nausea, vomiting, or diarrhea. 07/10/2021 upon evaluation today patient unfortunately does not appear to be doing nearly as well as what she previously was. There does not appear to be  any evidence of new epithelial growth she has a lot of necrotic tissue the base of the wound this is much more significant than what we previously noted. She also has been having increased pain and increased drainage. In general I am very concerned about this especially in light of the fact that she does have a history of breast cancer I want to ensure that were not looking at any cancerous type lesion at this point. Otherwise also think she does need some debridement we did do MolecuLight scanning today. Objective Constitutional [name] and well-hydrated in no acute distress. Vitals Time Taken: 9:50 AM, Height: 63 in, Weight: 176 lbs, BMI: 31.2, Temperature: 98.5 F, Pulse: 88 bpm, Respiratory Rate: 20 breaths/min, Blood Pressure: 157/66 mmHg. General Notes: Per patient has not taken morning BP medication. Respiratory normal breathing without difficulty. Psychiatric this patient is able to make decisions and demonstrates good insight into disease process. Alert and Oriented x 3. pleasant and cooperative. General Notes: Upon inspection

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
## 2021-07-16 LAB — AEROBIC/ANAEROBIC CULTURE W GRAM STAIN (SURGICAL/DEEP WOUND): Gram Stain: NONE SEEN

## 2021-07-17 ENCOUNTER — Other Ambulatory Visit: Payer: Self-pay

## 2021-07-17 ENCOUNTER — Encounter (HOSPITAL_BASED_OUTPATIENT_CLINIC_OR_DEPARTMENT_OTHER): Payer: Medicare Other | Attending: Physician Assistant | Admitting: Physician Assistant

## 2021-07-17 DIAGNOSIS — Z853 Personal history of malignant neoplasm of breast: Secondary | ICD-10-CM | POA: Insufficient documentation

## 2021-07-17 DIAGNOSIS — Y811 Therapeutic (nonsurgical) and rehabilitative general- and plastic-surgery devices associated with adverse incidents: Secondary | ICD-10-CM | POA: Insufficient documentation

## 2021-07-17 DIAGNOSIS — Y842 Radiological procedure and radiotherapy as the cause of abnormal reaction of the patient, or of later complication, without mention of misadventure at the time of the procedure: Secondary | ICD-10-CM | POA: Diagnosis not present

## 2021-07-17 DIAGNOSIS — L589 Radiodermatitis, unspecified: Secondary | ICD-10-CM | POA: Diagnosis not present

## 2021-07-17 DIAGNOSIS — A499 Bacterial infection, unspecified: Secondary | ICD-10-CM | POA: Diagnosis not present

## 2021-07-17 DIAGNOSIS — L598 Other specified disorders of the skin and subcutaneous tissue related to radiation: Secondary | ICD-10-CM | POA: Insufficient documentation

## 2021-07-17 DIAGNOSIS — L98499 Non-pressure chronic ulcer of skin of other sites with unspecified severity: Secondary | ICD-10-CM | POA: Diagnosis present

## 2021-07-17 DIAGNOSIS — L98492 Non-pressure chronic ulcer of skin of other sites with fat layer exposed: Secondary | ICD-10-CM | POA: Diagnosis not present

## 2021-07-17 DIAGNOSIS — B965 Pseudomonas (aeruginosa) (mallei) (pseudomallei) as the cause of diseases classified elsewhere: Secondary | ICD-10-CM | POA: Diagnosis not present

## 2021-07-17 NOTE — Progress Notes (Addendum)
[name], [name] ([id number]) Visit Report for 07/17/2021 Chief Complaint Document Details Patient Name: Date of Service: [name] 07/17/2021 9:15 A M Medical Record Number: [id number] Patient Account Number: [id number] Date of Birth/Sex: Treating RN: 11-04-1950 (70 y.o. 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 [name] Primary Care Provider: [name] Other Clinician: Referring Provider: Treating Provider/Extender: [name], [name]: 52 History of Present Illness HPI Description: 08/22/2020 upon evaluation today patient actually appears to be doing somewhat poorly in regard to her mastectomy site on the right chest wall. She had a mastectomy initially in 1998. She had chemotherapy only no radiation following. In 2002 she subsequently did undergo radiation for the first time and then subsequently in 2012 had repeat radiation after having had a finding that was consistent with a relapse of the cancer. 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 She also has radiation damage to the skin over the right chest wall where she had the mastectomy. She was referred to [place] by Dr. Matthew Saras in [facility] and her oncologist is Dr. Burney Gauze.  Subsequently I want to make sure before we delve into this more deeply that the patient does not have any issues with a return of cancer that needs to be managed by them. 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 Nonetheless she does not remember exactly when her last PET scan was but it was not too recently she tells me. 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 The patient does have hypertension as well. She sees her oncologist next on December 6. 09/12/2020 on evaluation today patient actually appears to be doing excellent in regard to her wound under the right breast location. Currently this is measuring smaller in general seems to be doing very well. She tells me that the collagen is doing a good job and that the dressing that she has been putting on is not causing any troubles as far pulling on her skin or scar tissue is concerned all of which is excellent news. 10/03/2020 upon evaluation today patient appears to be doing well with regard to her surgical site from her mastectomy on the right breast region. She tells me that she has had very little bleeding nothing seems to be sticking badly and in general she has been extremely pleased with where things stand today. No fevers, chills, nausea, vomiting, or diarrhea. 10/24/2020 upon evaluation today patient actually appears to be doing excellent in regard to her wound. There is just a very small area still open and to be honest there was minimal slough noted on the surface of the wound nothing that requires sharp debridement. In general I feel like she is actually doing quite well and overall I am pleased. I do think we may switch to silver alginate dressing and  also contemplating using a AandE ointment in order to help keep everything moist and the scar tissue region while the alginate  keeps it dry enough to heal 11/14/2020 upon evaluation today patient appears to be doing well at this point in regard to her wound. I do feel like that she is making good progress again this is a very difficult region over the surgical site of the right chest wall at the site of mastectomy. Nonetheless I think that we are just a very small area still open I think alginate is doing a good job helping to dry this up and I would recommend this such that we continue with this. 01/02/2021 on evaluation today patient's wound actually appears to be doing decently well. There does not appear to be any signs of active infection which is great news. She does have some slight slough buildup with biofilm on the surface of the wound mainly more biofilm. Nonetheless I was able to gently remove this with saline and gauze as well as a sterile Q-tip. She tolerated that today without complication. 01/30/2021 upon evaluation today patient appears to be doing well with regard to her wound although she still has quite a bit of trouble getting this to close I think the scar tissue and radiation damage is quite significant here unfortunately. There does not appear to be any signs of active infection which is great news. No fevers, chills, nausea, vomiting, or diarrhea. 02/27/2021 upon evaluation today patient appears to be doing excellent in regard to her wound. She has been tolerating the dressing changes without complication and in general I am extremely pleased with where things stand today. There does not appear to be any signs of infection which is great news. No fevers, chills, nausea, vomiting, or diarrhea. With that being said I do think that she still developing some slough buildup. I did discuss with her today the possibility of proceeding with HBO therapy. We have had this discussion before but she really is not quite committed to wanting to do that much and spend that much time in the chamber. She wants to still think  about it. 03/27/2021 upon evaluation today patient appears to be doing about the same in regard to her wound. She still developing a lot of slough buildup on the surface of the wound. I did try to clean that away to some degree today. She tolerated that without any pain or complication. Subsequently I am thinking we may want to switch over to [facility] see if this may do better for her. Patient is in agreement with giving that a trial. 05/01/2021 upon evaluation today patient appears to be doing about the same in regard to her wounds. She has been tolerating the dressing changes without complication. Fortunately there does not appear to be any signs of active infection at this time which is great news. No fevers, chills, nausea, vomiting, or diarrhea.. 06/05/2021 upon evaluation today patient appears to be doing pretty well currently in regard to her wound at the mastectomy site right chest wall. Overall since we switch back to the alginate she tells me things have significantly improved she is much happier with where things stand currently. Fortunately there does not appear to be any signs of active infection which is great news. No fevers, chills, nausea, vomiting, or diarrhea. 07/10/2021 upon evaluation today patient unfortunately does not appear to be doing nearly as well as what she previously was. There does not appear to be any evidence of new epithelial growth  she has a lot of necrotic tissue the base of the wound this is much more significant than what we previously noted. She also has been having increased pain and increased drainage. In general I am very concerned about this especially in light of the fact that she does have a history of breast cancer I want to ensure that were not looking at any cancerous type lesion at this point. Otherwise also think she does need some debridement we did do MolecuLight scanning today. 07/17/2021 upon evaluation today patient appears to be doing well with regard  to her wound compared to last week although she still having some erythema I am concerned in this regard. I do think that she fortunately had a negative biopsy which is great news unfortunately she did have Pseudomonas noted as a bacteria present in the culture which I think is good and need to be addressed with Levaquin. I Minna send that into the pharmacy today. We did repeat the MolecuLight screening. Electronic Signature(s) Signed: 07/17/2021 9:51:41 AM By: Worthy Keeler PA-C Entered By: Worthy Keeler on 07/17/2021 09:51:40 -------------------------------------------------------------------------------- Physical Exam Details Patient Name: Date of Service: [name], [name] 07/17/2021 9:15 A M Medical Record Number: [id number] Patient Account Number: [id number] Date of Birth/Sex: Treating RN: January 01, 1951 (70 y.o. [name] Primary Care Provider: [name] Other Clinician: Referring Provider: Treating Provider/Extender: [name], [name]: 54 Constitutional [name] and well-hydrated in no acute distress. Respiratory normal breathing without difficulty. Psychiatric this patient is able to make decisions and demonstrates good insight into disease process. Alert and Oriented x 3. pleasant and cooperative. Notes Upon inspection patient's wound bed actually showed signs of good granulation epithelization at this point. Fortunately there does not appear to be any evidence of active infection which is great news and overall very pleased with where things stand systemically though locally there is definitely signs of cellulitis. For that reason I avoided any debridement today I may perform some additional debridement in the next time I see her. Right now though I did not want her risk spreading anything until I get her on the appropriate antibiotic for few days at least. Electronic Signature(s) Signed: 07/17/2021 9:52:10 AM By: Worthy Keeler PA-C Entered By: Worthy Keeler on 07/17/2021 09:52:10 -------------------------------------------------------------------------------- Physician Orders Details Patient Name: Date of Service: [name], [name] 07/17/2021 9:15 A M Medical Record Number: [id number] Patient Account Number: [id number] Date of Birth/Sex: Treating RN: Jan 20, 1951 (70 y.o. [name] Primary Care Provider: [name] Other Clinician: Referring Provider: Treating Provider/Extender: [name], [name]: 33 Verbal / Phone Orders: No Diagnosis Coding ICD-10 Coding Code Description [phone] Non-pressure chronic ulcer of skin of other sites with fat layer exposed L58.9 Radiodermatitis, unspecified L59.8 Other specified disorders of the skin and subcutaneous tissue related to radiation I10 Essential (primary) hypertension Follow-up Appointments ppointment in 2 weeks. Jeri Cos PA Return A Pick up new antibiotic at pharmacy [facility] May shower and wash wound with soap and water. - on days that dressing is changed Additional Orders / Instructions Follow Nutritious Diet Wound Treatment Wound #1 - Breast (mastectomy site) Wound Laterality: Right Cleanser: Soap and Water 1 x Per Day/30 Days Discharge Instructions: May shower and wash wound with dial antibacterial soap and water prior to dressing change. Prim Dressing: Dakin's solution 1 x Per Day/30 Days ary Discharge Instructions: apply dakin's moistened gauze to wound bed Secondary Dressing: Woven Gauze Sponges  2x2 in (Generic) 1 x Per Day/30 Days Discharge Instructions: Apply over primary dressing as directed. Secondary Dressing: ABD Pad, 8x10 (Generic) 1 x Per Day/30 Days Discharge Instructions: Apply over primary dressing as directed. Secured With: 1M Medipore H Soft Cloth Surgical Tape, 2x2 (in/yd) (Generic) 1 x Per Day/30 Days Discharge Instructions: Secure dressing with tape as  directed. Patient Medications llergies: Iodinated Contrast Media A Notifications Medication Indication Start End 07/17/2021 Levaquin DOSE 1 - oral 750 mg tablet - 1 tablet oral taken 1 time per day for 14 days Electronic Signature(s) Signed: 07/17/2021 9:53:04 AM By: Worthy Keeler PA-C Entered By: Worthy Keeler on 07/17/2021 09:53:03 -------------------------------------------------------------------------------- Problem List Details Patient Name: Date of Service: [name], [name] 07/17/2021 9:15 A M Medical Record Number: [id number] Patient Account Number: [id number] Date of Birth/Sex: Treating RN: 1951/01/05 (70 y.o. [name] Primary Care Provider: [name] Other Clinician: Referring Provider: Treating Provider/Extender: [name], [name]: 47 Active Problems ICD-10 Encounter Code Description Active Date MDM Diagnosis [id number] Non-pressure chronic ulcer of skin of other sites with fat layer exposed 08/22/2020 No Yes L58.9 Radiodermatitis, unspecified 08/22/2020 No Yes L59.8 Other specified disorders of the skin and subcutaneous tissue related to 08/22/2020 No Yes radiation I10 Essential (primary) hypertension 08/22/2020 No Yes Inactive Problems Resolved Problems Electronic Signature(s) Signed: 07/17/2021 9:17:43 AM By: Worthy Keeler PA-C Entered By: Worthy Keeler on 07/17/2021 09:17:43 -------------------------------------------------------------------------------- Progress Note Details Patient Name: Date of Service: [name], [name] 07/17/2021 9:15 A M Medical Record Number: [id number] Patient Account Number: [id number] Date of Birth/Sex: Treating RN: [birth year] (70 y.o. 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 She had a mastectomy initially in 1998. She had chemotherapy only no radiation following. In 2002 she subsequently did undergo radiation for the first time and then subsequently in 2012 had repeat radiation after having had a finding that was consistent with a relapse of the cancer. 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 She also has radiation damage to the skin over the right chest wall where she had the mastectomy. She was referred to [place] by Dr. Matthew Saras in [facility] and her oncologist is Dr. Burney Gauze. Subsequently I want to make sure before we delve into this more deeply that the patient does not have any issues with a return of cancer that needs to be managed by them. 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 Nonetheless she does not remember exactly when her last PET scan was but it was not too recently she tells me. 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 The patient does have hypertension as well. She sees her oncologist next on December 6. 09/12/2020 on evaluation today patient actually appears to be doing excellent in regard to her wound under the right breast location. Currently this is measuring smaller in general seems to be doing very well. She tells me that the collagen is doing a  good job and that the dressing that she has been putting on is not causing any troubles as far pulling on her skin or scar tissue is concerned all of which is excellent news. 10/03/2020 upon evaluation today patient appears to be doing well  with regard to her surgical site from her mastectomy on the right breast region. She tells me that she has had very little bleeding nothing seems to be sticking badly and in general she has been extremely pleased with where things stand today. No fevers, chills, nausea, vomiting, or diarrhea. 10/24/2020 upon evaluation today patient actually appears to be doing excellent in regard to her wound. There is just a very small area still open and to be honest there was minimal slough noted on the surface of the wound nothing that requires sharp debridement. In general I feel like she is actually doing quite well and overall I am pleased. I do think we may switch to silver alginate dressing and also contemplating using a AandE ointment in order to help keep everything moist and the scar tissue region while the alginate keeps it dry enough to heal 11/14/2020 upon evaluation today patient appears to be doing well at this point in regard to her wound. I do feel like that she is making good progress again this is a very difficult region over the surgical site of the right chest wall at the site of mastectomy. Nonetheless I think that we are just a very small area still open I think alginate is doing a good job helping to dry this up and I would recommend this such that we continue with this. 01/02/2021 on evaluation today patient's wound actually appears to be doing decently well. There does not appear to be any signs of active infection which is great news. She does have some slight slough buildup with biofilm on the surface of the wound mainly more biofilm. Nonetheless I was able to gently remove this with saline and gauze as well as a sterile Q-tip. She tolerated that today without complication. 01/30/2021 upon evaluation today patient appears to be doing well with regard to her wound although she still has quite a bit of trouble getting this to close I think the scar tissue and radiation damage is quite  significant here unfortunately. There does not appear to be any signs of active infection which is great news. No fevers, chills, nausea, vomiting, or diarrhea. 02/27/2021 upon evaluation today patient appears to be doing excellent in regard to her wound. She has been tolerating the dressing changes without complication and in general I am extremely pleased with where things stand today. There does not appear to be any signs of infection which is great news. No fevers, chills, nausea, vomiting, or diarrhea. With that being said I do think that she still developing some slough buildup. I did discuss with her today the possibility of proceeding with HBO therapy. We have had this discussion before but she really is not quite committed to wanting to do that much and spend that much time in the chamber. She wants to still think about it. 03/27/2021 upon evaluation today patient appears to be doing about the same in regard to her wound. She still developing a lot of slough buildup on the surface of the wound. I did try to clean that away to some degree today. She tolerated that without any pain or complication. Subsequently I am thinking we may want to switch over to [facility] see if this may do better  for her. Patient is in agreement with giving that a trial. 05/01/2021 upon evaluation today patient appears to be doing about the same in regard to her wounds. She has been tolerating the dressing changes without complication. Fortunately there does not appear to be any signs of active infection at this time which is great news. No fevers, chills, nausea, vomiting, or diarrhea.. 06/05/2021 upon evaluation today patient appears to be doing pretty well currently in regard to her wound at the mastectomy site right chest wall. Overall since we switch back to the alginate she tells me things have significantly improved she is much happier with where things stand currently. Fortunately there does not appear to be any  signs of active infection which is great news. No fevers, chills, nausea, vomiting, or diarrhea. 07/10/2021 upon evaluation today patient unfortunately does not appear to be doing nearly as well as what she previously was. There does not appear to be any evidence of new epithelial growth she has a lot of necrotic tissue the base of the wound this is much more significant than what we previously noted. She also has been having increased pain and increased drainage. In general I am very concerned about this especially in light of the fact that she does have a history of breast cancer I want to ensure that were not looking at any cancerous type lesion at this point. Otherwise also think she does need some debridement we did do MolecuLight scanning today. 07/17/2021 upon evaluation today patient appears to be doing well with regard to her wound compared to last week although she still having some erythema I am concerned in this regard. I do think that she fortunately had a negative biopsy which is great news unfortunately she did have Pseudomonas noted as a bacteria present in the culture which I think is good and need to be addressed with Levaquin. I Minna send that into the pharmacy today. We did repeat the MolecuLight screening. Objective Constitutional [name] and well-hydrated in no acute distress. Vitals Time Taken: 9:16 AM, Height: 63 in, Source: Stated, Weight: 176 lbs, Source: Stated, BMI: 31.2, Temperature: 98.6 F, Pulse: 88 bpm, Respiratory Rate: 18 breaths/min, Blood Pressure: 162/76 mmHg. Respiratory normal breathing without difficulty. Psychiatric this patient is able to make decisions and demonstrates good insight into disease process. Alert and Oriented x 3. pleasant and cooperative. General Notes: Upon inspection patient's wound bed actually showed signs of good granulation epithelization at this point. Fortunately there does not appear to be any evidence of active infection  which is great news and overall very pleased with where things stand systemically though locally there is definitely signs of cellulitis. For that reason I avoided any debridement today I may perform some additional debridement in the next time I see her. Right now though I did not want her risk spreading anything until I get her on the appropriate antibiotic for few days at least. Integumentary (Hair, Skin) Wound #1 status is Open. Original cause of wound was Radiation Burn. The date acquired was: 07/26/2020. The wound has been in treatment 47 weeks. The wound is located on the Right Breast (mastectomy site). The wound measures 1.8cm length x 2.8cm width x 0.7cm depth; 3.958cm^2 area and 2.771cm^3 volume. There is Fat Layer (Subcutaneous Tissue) exposed. There is no tunneling or undermining noted. There is a medium amount of serous drainage noted. The wound margin is distinct with the outline attached to the wound base. There is small (1-33%) pink granulation within the wound bed.  There is a large (67- 100%) amount of necrotic tissue within the wound bed including Adherent Slough. 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 Jeri Cos PA Pick up new antibiotic at pharmacy Bathing/ Shower/ Hygiene: May shower and wash wound with soap and water. - on days that dressing is changed Additional Orders / Instructions: Follow Nutritious Diet The following medication(s) was prescribed: Levaquin oral 750 mg tablet 1 1 tablet oral taken 1 time per day for 14 days starting 07/17/2021 WOUND #1: - Breast (mastectomy site) Wound Laterality: Right Cleanser: Soap and Water 1 x Per Day/30 Days Discharge Instructions: May shower and wash wound  with dial antibacterial soap and water prior to dressing change. Prim Dressing: Dakin's solution 1 x Per Day/30 Days ary Discharge Instructions: apply dakin's moistened gauze to wound bed Secondary Dressing: Woven Gauze Sponges 2x2 in (Generic) 1 x Per Day/30 Days Discharge Instructions: Apply over primary dressing as directed. Secondary Dressing: ABD Pad, 8x10 (Generic) 1 x Per Day/30 Days Discharge Instructions: Apply over primary dressing as directed. Secured With: 34M Medipore H Soft Cloth Surgical T ape, 2x2 (in/yd) (Generic) 1 x Per Day/30 Days Discharge Instructions: Secure dressing with tape as directed. 1. Would recommend currently that we go ahead and initiate treatment with Levaquin I will have her discontinue the doxycycline. That was sent into the pharmacy for her today. 2. I am also can recommend at this time that we have the patient continue with the Dakin's moistened gauze dressing. Which I think is also appropriate and hopefully will continue to clean things up as well from that standpoint. 3. I am also can recommend that she continue to monitor for any signs of worsening of the infection if anything changes she should let me know as soon as possible. We will see patient back for reevaluation in 2 weeks here in the clinic. If anything worsens or changes patient will contact our office for additional recommendations. MolecuLight DX: 1st Scanned Wound The following wound was scanned with MolecuLight DX): Right chest wall/mastectomy ulcer Fluorescence bacterial imaging was medically necessary today due to Prior visit fluorescence image demonstrated a high bioburden load monitoring (Indication): impact of prescribe tx MolecuLight Results Red Colors, Blush Colors, Cyan Colors., Green Colors The indicated colors were noted in the following area(s). In the periphery of the wound, In the center of the wound As a result of todays scan, the following treatment plans were put in place.  Continue to oral antibiotics but added Levaquin while discontinuing doxycycline discussed HBO Potential ICD-10 Codes B96.5 pseudomonas (Cyan Color), A49.9 Bacterial infection unspecified ICD-10 (Red/Blush/Yellow Color) Additional Scanned Wounds Did you scan any additional Woundso No Electronic Signature(s) Signed: 07/19/2021 11:53:23 AM By: Worthy Keeler PA-C Previous Signature: 07/17/2021 9:53:25 AM Version By: Worthy Keeler PA-C Entered By: Worthy Keeler on 07/19/2021 11:53:23 -------------------------------------------------------------------------------- SuperBill Details Patient Name: Date of Service: [name], [place] YE 07/17/2021 Medical Record Number: [id number] Patient Account Number: [id number] Date of Birth/Sex: Treating RN: [birth year] (70 y.o. [name] Primary Care Provider: [name] Other Clinician: Referring Provider: Treating Provider/Extender: [name], [name]: 47 Diagnosis Coding ICD-10 Codes Code Description [phone] Non-pressure chronic ulcer of skin of other sites with fat layer exposed L58.9 Radiodermatitis, unspecified L59.8 Other specified disorders of the skin  and subcutaneous tissue related to radiation I10 Essential (primary) hypertension Facility Procedures The patient participates with Medicare or their insurance follows the Medicare Facility Guidelines: CPT4 Code Description Modifier Quantity [id number] [place] VISIT-LEV 3 EST PT 25 1 The patient participates with Medicare or their insurance follows the Medicare Facility Guidelines: [id number] [place] WND 1ST STE 1 ICD-10 Diagnosis Description L98.492 Non-pressure chronic ulcer of skin of other sites with fat layer  exposed Physician Procedures : CPT4 Code Description Modifier [id number] - WC PHYS LEVEL 4 - EST PT 25 ICD-10 Diagnosis Description L98.492 Non-pressure chronic ulcer of skin of other sites with fat layer exposed  L58.9 Radiodermatitis, unspecified L59.8 Other specified disorders  of the skin and subcutaneous tissue related to radiation I10 Essential (primary) hypertension Quantity: 1 : [id number] NONCNTACT RT [place] WND 1ST STE ICD-10 Diagnosis Description L98.492 Non-pressure chronic ulcer of skin of other sites with fat layer exposed Quantity: 1 Electronic Signature(s) Signed: 07/19/2021 11:53:33 AM By: Worthy Keeler PA-C Previous Signature: 07/17/2021 9:53:35 AM Version By: Worthy Keeler PA-C Entered By: Worthy Keeler on 07/19/2021 11:53:33

## 2021-07-18 NOTE — Progress Notes (Signed)
ANNAI, HEICK ([id number]) Visit Report for 07/17/2021 Arrival Information Details Patient Name: Date of Service: [name] 07/17/2021 9:15 A M Medical Record Number: [id number] Patient Account Number: [id number] Date of Birth/Sex: Treating RN: 1950/12/17 (70 y.o. [name], [name] Primary Care Payge Eppes: [name] Other Clinician: Referring Anvay Tennis: Treating Geeta Dworkin/Extender: [name], [name] in Treatment: 71 Visit Information History Since Last Visit Added or deleted any medications: No Patient Arrived: Ambulatory Any new allergies or adverse reactions: No Arrival Time: 09:11 Had a fall or experienced change in No Accompanied By: spouse activities of daily living that may affect Transfer Assistance: None risk of falls: Patient Identification Verified: Yes Signs or symptoms of abuse/neglect since last visito No Secondary Verification Process Completed: Yes Hospitalized since last visit: No Patient Requires Transmission-Based Precautions: No Implantable device outside of the clinic excluding No Patient Has Alerts: No cellular tissue based products placed in the center since last visit: Has Dressing in Place as Prescribed: Yes Pain Present Now: No Electronic Signature(s) Signed: 07/18/2021 6:17:04 PM By: Baruch Gouty RN, BSN Entered By: Baruch Gouty on 07/17/2021 09:16:44 -------------------------------------------------------------------------------- Clinic Level of Care Assessment Details Patient Name: Date of Service: [name], [name] 07/17/2021 9:15 A M Medical Record Number: [id number] Patient Account Number: [id number] Date of Birth/Sex: Treating RN: 1950-10-23 (70 y.o. [name] Primary Care Mikena Masoner: [name] Other Clinician: Referring Tam Delisle: Treating Abigael Mogle/Extender: [name], [name]: 47 Clinic Level of Care Assessment Items TOOL 4 Quantity Score _0  - 0 Use when only an  EandM is performed on FOLLOW-UP visit ASSESSMENTS - Nursing Assessment / Reassessment X- 1 10 Reassessment of Co-morbidities (includes updates in patient status) X- 1 5 Reassessment of Adherence to Treatment Plan ASSESSMENTS - Wound and Skin A ssessment / Reassessment X - Simple Wound Assessment / Reassessment - one wound 1 5 _1  - 0 Complex Wound Assessment / Reassessment - multiple wounds _2  - 0 Dermatologic / Skin Assessment (not related to wound area) ASSESSMENTS - Focused Assessment _3  - 0 Circumferential Edema Measurements - multi extremities _4  - 0 Nutritional Assessment / Counseling / Intervention _5  - 0 Lower Extremity Assessment (monofilament, tuning fork, pulses) _6  - 0 Peripheral Arterial Disease Assessment (using hand held doppler) ASSESSMENTS - Ostomy and/or Continence Assessment and Care _7  - 0 Incontinence Assessment and Management _8  - 0 Ostomy Care Assessment and Management (repouching, etc.) PROCESS - Coordination of Care X - Simple Patient / Family Education for ongoing care 1 15 _9  - 0 Complex (extensive) Patient / Family Education for ongoing care X- 1 10 Staff obtains Programmer, systems, Records, T Results / Process Orders est _10  - 0 Staff telephones HHA, Nursing Homes / Clarify orders / etc _11  - 0 Routine Transfer to another Facility (non-emergent condition) _12  - 0 Routine Hospital Admission (non-emergent condition) _13  - 0 New Admissions / Biomedical engineer / Ordering NPWT Apligraf, etc. , _14  - 0 Emergency Hospital Admission (emergent condition) X- 1 10 Simple Discharge Coordination _15  - 0 Complex (extensive) Discharge Coordination PROCESS - Special Needs _16  - 0 Pediatric / Minor Patient Management _17  - 0 Isolation Patient Management _18  - 0 Hearing / Language / Visual special needs _19  - 0 Assessment of Community assistance (transportation, D/C planning, etc.) _20  - 0 Additional assistance / Altered mentation _21  - 0 Support Surface(s)  Assessment (bed, cushion, seat, etc.) INTERVENTIONS - Wound Cleansing / Measurement X - Simple Wound Cleansing - one wound 1 5 _22  - 0 Complex Wound Cleansing -  multiple wounds X- 1 5 Wound Imaging (photographs - any number of wounds) _0  - 0 Wound Tracing (instead of photographs) X- 1 5 Simple Wound Measurement - one wound _1  - 0 Complex Wound Measurement - multiple wounds INTERVENTIONS - Wound Dressings X - Small Wound Dressing one or multiple wounds 1 10 _2  - 0 Medium Wound Dressing one or multiple wounds _3  - 0 Large Wound Dressing one or multiple wounds X- 1 5 Application of Medications - topical <YQMVHQIONGEXBMWU>_1<\/[id number]>_0  - 0 Application of Medications - injection INTERVENTIONS - Miscellaneous _5  - 0 External ear exam _6  - 0 Specimen Collection (cultures, biopsies, blood, body fluids, etc.) _7  - 0 Specimen(s) / Culture(s) sent or taken to Lab for analysis _8  - 0 Patient Transfer (multiple staff / Civil Service fast streamer / Similar devices) _9  - 0 Simple Staple / Suture removal (25 or less) _10  - 0 Complex Staple / Suture removal (26 or more) _11  - 0 Hypo / Hyperglycemic Management (close monitor of Blood Glucose) _12  - 0 Ankle / Brachial Index (ABI) - do not check if billed separately X- 1 5 Vital Signs Has the patient been seen at the hospital within the last three years: Yes Total Score: 90 Level Of Care: New/Established - Level 3 Electronic Signature(s) Signed: 07/18/2021 6:17:04 PM By: Baruch Gouty RN, BSN Entered By: Baruch Gouty on 07/17/2021 09:45:00 -------------------------------------------------------------------------------- Encounter Discharge Information Details Patient Name: Date of Service: [name], [name] 07/17/2021 9:15 A M Medical Record Number: [id number] Patient Account Number: [id number] Date of Birth/Sex: Treating RN: 17-Oct-1950 (70 y.o. [name] Primary Care Bulah Lurie: [name] Other Clinician: Referring Martice Doty: Treating Danzig Macgregor/Extender: [name], [name]: 50 Encounter Discharge Information Items Discharge Condition: Stable Ambulatory Status: Ambulatory Discharge Destination: Home Transportation: Private Auto Accompanied By: spouse Schedule Follow-up Appointment: Yes Clinical Summary of Care: Patient Declined Electronic Signature(s) Signed: 07/18/2021 6:17:04 PM By: Baruch Gouty RN, BSN Entered By: Baruch Gouty on 07/17/2021 09:53:43 -------------------------------------------------------------------------------- Lower Extremity Assessment Details Patient Name: Date of Service: [name], [name] 07/17/2021 9:15 A M Medical Record Number: [id number] Patient Account Number: [id number] Date of Birth/Sex: Treating RN: 12/10/1950 (70 y.o. [name] Primary Care Kurt Hoffmeier: [name] Other Clinician: Referring Akia Desroches: Treating Dante Cooter/Extender: [name], [name]: [phone] Electronic Signature(s) Signed: 07/18/2021 6:17:04 PM By: Baruch Gouty RN, BSN Entered By: Baruch Gouty on 07/17/2021 09:17:58 -------------------------------------------------------------------------------- Multi-Disciplinary Care Plan Details Patient Name: Date of Service: [name], [name] 07/17/2021 9:15 A M Medical Record Number: [id number] Patient Account Number: [id number] Date of Birth/Sex: Treating RN: October 02, 1951 (70 y.o. [name] Primary Care Vitor Overbaugh: [name] Other Clinician: Referring Leeandra Ellerson: Treating Gor Vestal/Extender: [name], [name] in Treatment: 47 [place] reviewed with physician Active Inactive Necrotic Tissue Nursing Diagnoses: Knowledge deficit related to management of necrotic/devitalized tissue Goals: Necrotic/devitalized tissue will be minimized in the wound bed Date Initiated: 07/10/2021 Target Resolution Date: 08/16/2021 Goal Status: Active Interventions: Provide education on  necrotic tissue and debridement process Treatment Activities: Excisional debridement : 07/10/2021 Notes: Soft Tissue Infection Nursing Diagnoses: Impaired tissue integrity Knowledge deficit related to disease process and management Goals: Patient's soft tissue infection will resolve Date Initiated: 07/10/2021 Target Resolution Date: 08/09/2021 Goal Status: Active Interventions: Assess signs and symptoms of infection every visit Provide education on infection Treatment Activities: Culture : 07/10/2021 Education provided on Infection : 07/10/2021 Systemic antibiotics : 07/10/2021 T ordered outside of clinic : 07/10/2021 est Notes: Wound/Skin Impairment Nursing Diagnoses:  Impaired tissue integrity Knowledge deficit related to ulceration/compromised skin integrity Goals: Patient/caregiver will verbalize understanding of skin care regimen Date Initiated: 08/22/2020 Target Resolution Date: 08/14/2021 Goal Status: Active Ulcer/skin breakdown will have a volume reduction of 30% by week 4 Date Initiated: 08/22/2020 Date Inactivated: 10/03/2020 Target Resolution Date: 09/19/2020 Goal Status: Met Ulcer/skin breakdown will have a volume reduction of 50% by week 8 Date Initiated: 06/05/2021 Date Inactivated: 07/17/2021 Target Resolution Date: 07/03/2021 Goal Status: Unmet Unmet Reason: infection Interventions: Assess patient/caregiver ability to obtain necessary supplies Assess patient/caregiver ability to perform ulcer/skin care regimen upon admission and as needed Assess ulceration(s) every visit Treatment Activities: Skin care regimen initiated : 08/22/2020 Topical wound management initiated : 08/22/2020 Notes: 06/05/21: Wound care regimen ongoing. Electronic Signature(s) Signed: 07/18/2021 6:17:04 PM By: Baruch Gouty RN, BSN Entered By: Baruch Gouty on 07/17/2021 09:27:54 -------------------------------------------------------------------------------- Pain Assessment  Details Patient Name: Date of Service: [name], [name] 07/17/2021 9:15 A M Medical Record Number: [id number] Patient Account Number: [id number] Date of Birth/Sex: Treating RN: 03-21-1951 (70 y.o. [name] Primary Care Danila Eddie: [name] Other Clinician: Referring Davy Faught: Treating Krystina Strieter/Extender: [name], [name]: 47 Active Problems Location of Pain Severity and Description of Pain Patient Has Paino No Site Locations Rate the pain. Current Pain Level: 0 Pain Management and Medication Current Pain Management: Electronic Signature(s) Signed: 07/18/2021 6:17:04 PM By: Baruch Gouty RN, BSN Entered By: Baruch Gouty on 07/17/2021 09:17:51 -------------------------------------------------------------------------------- Patient/Caregiver Education Details Patient Name: Date of Service: [name], [name] 10/5/2022andnbsp9:15 A M Medical Record Number: [id number] Patient Account Number: [id number] Date of Birth/Gender: Treating RN: [birth year] (70 y.o. [name] Primary Care Physician: [name] Other Clinician: Referring Physician: Treating Physician/Extender: [name], [name] in Treatment: 39 Education Assessment Education Provided To: Patient Education Topics Provided Infection: Methods: Explain/Verbal Responses: Reinforcements needed, State content correctly Wound Debridement: Methods: Explain/Verbal Responses: Reinforcements needed, State content correctly Electronic Signature(s) Signed: 07/18/2021 6:17:04 PM By: Baruch Gouty RN, BSN Entered By: Baruch Gouty on 07/17/2021 35:67:01 -------------------------------------------------------------------------------- Wound Assessment Details Patient Name: Date of Service: [name], [name] 07/17/2021 9:15 A M Medical Record Number: [id number] Patient Account Number: [id number] Date of Birth/Sex: Treating RN: 03-24-1951 (70 y.o.  [name] Primary Care Alisah Grandberry: [name] Other Clinician: Referring Deklen Popelka: Treating Quinesha Selinger/Extender: [name], [name]: 47 Wound Status Wound Number: 1 Primary Soft Tissue Radionecrosis Etiology: Wound Location: Right Breast (mastectomy site) Wound Open Wounding Event: Radiation Burn Status: Date Acquired: 07/26/2020 Comorbid Anemia, Lymphedema, Deep Vein Thrombosis, Hypertension, Weeks Of Treatment: 47 History: Peripheral Venous Disease, Osteoarthritis, Received Clustered Wound: No Chemotherapy, Received Radiation, Confinement Anxiety Photos Wound Measurements Length: (cm) 1.8 Width: (cm) 2.8 Depth: (cm) 0.7 Area: (cm) 3.958 Volume: (cm) 2.771 % Reduction in Area: -158.4% % Reduction in Volume: -1711.1% Epithelialization: None Tunneling: No Undermining: No Wound Description Classification: Full Thickness Without Exposed Support Structu Wound Margin: Distinct, outline attached Exudate Amount: Medium Exudate Type: Serous Exudate Color: amber res Foul Odor After Cleansing: No Slough/Fibrino Yes Wound Bed Granulation Amount: Small (1-33%) Exposed Structure Granulation Quality: Pink Fascia Exposed: No Necrotic Amount: Large (67-100%) Fat Layer (Subcutaneous Tissue) Exposed: Yes Necrotic Quality: Adherent Slough Tendon Exposed: No Muscle Exposed: No Joint Exposed: No Bone Exposed: No Treatment Notes Wound #1 (Breast (mastectomy site)) Wound Laterality: Right Cleanser Soap and Water Discharge Instruction: May shower and wash wound with dial antibacterial soap and water prior to dressing change. Peri-Wound Care Topical Primary Dressing  Dakin's solution Discharge Instruction: apply dakin's moistened gauze to wound bed Secondary Dressing Woven Gauze Sponges 2x2 in Discharge Instruction: Apply over primary dressing as directed. ABD Pad, 8x10 Discharge Instruction: Apply over primary dressing as  directed. Secured With 25M [place] Surgical T ape, 2x2 (in/yd) Discharge Instruction: Secure dressing with tape as directed. Compression Wrap Compression Stockings Add-Ons Electronic Signature(s) Signed: 07/18/2021 6:17:04 PM By: Baruch Gouty RN, BSN Entered By: Baruch Gouty on 07/17/2021 09:25:25 -------------------------------------------------------------------------------- Vitals Details Patient Name: Date of Service: [name], [name] 07/17/2021 9:15 A M Medical Record Number: [id number] Patient Account Number: [id number] Date of Birth/Sex: Treating RN: [birth year] (70 y.o. [name] Primary Care Chancellor Vanderloop: [name] Other Clinician: Referring Tanganika Barradas: Treating Marylan Glore/Extender: [name], [name]: 47 Vital Signs Time Taken: 09:16 Temperature (F): 98.6 Height (in): 63 Pulse (bpm): 88 Source: Stated Respiratory Rate (breaths/min): 18 Weight (lbs): 176 Blood Pressure (mmHg): 162/76 Source: Stated Reference Range: 80 - 120 mg / dl Body Mass Index (BMI): 31.2 Electronic Signature(s) Signed: 07/18/2021 6:17:04 PM By: Baruch Gouty RN, BSN Entered By: Baruch Gouty on 07/17/2021 09:17:12

## 2021-07-31 ENCOUNTER — Other Ambulatory Visit: Payer: Self-pay

## 2021-07-31 ENCOUNTER — Encounter (HOSPITAL_BASED_OUTPATIENT_CLINIC_OR_DEPARTMENT_OTHER): Payer: Medicare Other | Admitting: Physician Assistant

## 2021-07-31 ENCOUNTER — Ambulatory Visit: Payer: Medicare Other

## 2021-07-31 DIAGNOSIS — L589 Radiodermatitis, unspecified: Secondary | ICD-10-CM | POA: Diagnosis not present

## 2021-07-31 DIAGNOSIS — Z853 Personal history of malignant neoplasm of breast: Secondary | ICD-10-CM | POA: Diagnosis not present

## 2021-07-31 DIAGNOSIS — L598 Other specified disorders of the skin and subcutaneous tissue related to radiation: Secondary | ICD-10-CM | POA: Diagnosis not present

## 2021-07-31 NOTE — Progress Notes (Addendum)
[name], [name] ([id number]) Visit Report for 07/31/2021 Chief Complaint Document Details Patient Name: Date of Service: [name], [name] 07/31/2021 3:00 PM Medical Record Number: [id number] Patient Account Number: [id number] Date of Birth/Sex: Treating RN: Oct 21, 1950 (70 y.o. 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 [name], [name] Primary Care Provider: [name] Other Clinician: Referring Provider: Treating Provider/Extender: [name], [name]: 49 Debridement Performed for Assessment: Wound #1 Right Breast (mastectomy site) Performed By: Physician Worthy Keeler, PA Debridement Type: Debridement Level of Consciousness (Pre-procedure): Awake and Alert Pre-procedure Verification/Time Out Yes - 15:45 Taken: Start Time: 15:48 Pain Control: Lidocaine 4% T opical Solution T Area Debrided (L x W): otal 2 (cm) x 3.1 (cm) = 6.2 (cm) Tissue and other material debrided: Viable, Non-Viable, Slough, Subcutaneous, Slough Level: Skin/Subcutaneous Tissue Debridement Description: Excisional Instrument: Curette Bleeding: Minimum Hemostasis Achieved: Pressure End Time: 15:51 Procedural Pain: 0 Post Procedural Pain: 0 Response to Treatment: Procedure was tolerated well Level of Consciousness  (Post- Awake and Alert procedure): Post Debridement Measurements of Total Wound Length: (cm) 2 Width: (cm) 3.1 Depth: (cm) 0.5 Volume: (cm) 2.435 Character of Wound/Ulcer Post Debridement: Requires Further Debridement Post Procedure Diagnosis Same as Pre-procedure Electronic Signature(s) Signed: 07/31/2021 4:03:51 PM By: Worthy Keeler PA-C Signed: 08/01/2021 5:50:03 PM By: Baruch Gouty RN, BSN Entered By: Baruch Gouty on 07/31/2021 15:51:48 -------------------------------------------------------------------------------- HPI Details Patient Name: Date of Service: [name], [name] 07/31/2021 3:00 PM Medical Record Number: [id number] Patient Account Number: [id number] Date of Birth/Sex: Treating RN: 03/27/1951 (70 y.o. [name] Primary Care Provider: [name] Other Clinician: Referring Provider: Treating Provider/Extender: [name], [name]: 50 History of Present Illness HPI Description: 08/22/2020 upon evaluation today patient actually appears to be doing somewhat poorly in regard to her mastectomy site on the right chest wall. She had a mastectomy initially in 1998. She had chemotherapy only no radiation following. In 2002 she subsequently did undergo radiation for the first time and then subsequently in 2012 had repeat radiation after having had a finding that was consistent with a relapse of the cancer. 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 She also has radiation damage to the skin over the right chest wall where she had the mastectomy. She was referred to [place] by Dr. Matthew Saras in [facility] and her oncologist is Dr. Burney Gauze. Subsequently I want to make sure before we delve into this more deeply that the patient does not have any issues with a return of cancer that needs to be managed by them. Obviously she does have significant scar tissue which may be benefited  secondary to the soft tissue radionecrosis by hyperbaric oxygen therapy in the absence of any recurrence of the cancer. Nonetheless she does not remember exactly when her last PET scan was but it was not too recently she tells me. 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 The patient does have hypertension  as well. She sees her oncologist next on December 6. 09/12/2020 on evaluation today patient actually appears to be doing excellent in regard to her wound under the right breast location. Currently this is measuring smaller in general seems to be doing very well. She tells me that the collagen is doing a good job and that the dressing that she has been putting on is not causing any troubles as far pulling on her skin or scar tissue is concerned all of which is excellent news. 10/03/2020 upon evaluation today patient appears to be doing well with regard to her surgical site from her mastectomy on the right breast region. She tells me that she has had very little bleeding nothing seems to be sticking badly and in general she has been extremely pleased with where things stand today. No fevers, chills, nausea, vomiting, or diarrhea. 10/24/2020 upon evaluation today patient actually appears to be doing excellent in regard to her wound. There is just a very small area still open and to be honest there was minimal slough noted on the surface of the wound nothing that requires sharp debridement. In general I feel like she is actually doing quite well and overall I am pleased. I do think we may switch to silver alginate dressing and also contemplating using a AandE ointment in order to help keep everything moist and the scar tissue region while the alginate keeps it dry enough to heal 11/14/2020 upon evaluation today patient appears to be doing well at this point in regard to her wound. I do feel like that she is making good progress again this is a very difficult region over the surgical site of  the right chest wall at the site of mastectomy. Nonetheless I think that we are just a very small area still open I think alginate is doing a good job helping to dry this up and I would recommend this such that we continue with this. 01/02/2021 on evaluation today patient's wound actually appears to be doing decently well. There does not appear to be any signs of active infection which is great news. She does have some slight slough buildup with biofilm on the surface of the wound mainly more biofilm. Nonetheless I was able to gently remove this with saline and gauze as well as a sterile Q-tip. She tolerated that today without complication. 01/30/2021 upon evaluation today patient appears to be doing well with regard to her wound although she still has quite a bit of trouble getting this to close I think the scar tissue and radiation damage is quite significant here unfortunately. There does not appear to be any signs of active infection which is great news. No fevers, chills, nausea, vomiting, or diarrhea. 02/27/2021 upon evaluation today patient appears to be doing excellent in regard to her wound. She has been tolerating the dressing changes without complication and in general I am extremely pleased with where things stand today. There does not appear to be any signs of infection which is great news. No fevers, chills, nausea, vomiting, or diarrhea. With that being said I do think that she still developing some slough buildup. I did discuss with her today the possibility of proceeding with HBO therapy. We have had this discussion before but she really is not quite committed to wanting to do that much and spend that much time in the chamber. She wants to still think about it. 03/27/2021 upon evaluation today patient appears to be doing about the same in regard to her  wound. She still developing a lot of slough buildup on the surface of the wound. I did try to clean that away to some degree today. She  tolerated that without any pain or complication. Subsequently I am thinking we may want to switch over to [facility] see if this may do better for her. Patient is in agreement with giving that a trial. 05/01/2021 upon evaluation today patient appears to be doing about the same in regard to her wounds. She has been tolerating the dressing changes without complication. Fortunately there does not appear to be any signs of active infection at this time which is great news. No fevers, chills, nausea, vomiting, or diarrhea.. 06/05/2021 upon evaluation today patient appears to be doing pretty well currently in regard to her wound at the mastectomy site right chest wall. Overall since we switch back to the alginate she tells me things have significantly improved she is much happier with where things stand currently. Fortunately there does not appear to be any signs of active infection which is great news. No fevers, chills, nausea, vomiting, or diarrhea. 07/10/2021 upon evaluation today patient unfortunately does not appear to be doing nearly as well as what she previously was. There does not appear to be any evidence of new epithelial growth she has a lot of necrotic tissue the base of the wound this is much more significant than what we previously noted. She also has been having increased pain and increased drainage. In general I am very concerned about this especially in light of the fact that she does have a history of breast cancer I want to ensure that were not looking at any cancerous type lesion at this point. Otherwise also think she does need some debridement we did do MolecuLight scanning today. 07/17/2021 upon evaluation today patient appears to be doing well with regard to her wound compared to last week although she still having some erythema I am concerned in this regard. I do think that she fortunately had a negative biopsy which is great news unfortunately she did have Pseudomonas noted as a bacteria  present in the culture which I think is good and need to be addressed with Levaquin. I Minna send that into the pharmacy today. We did repeat the MolecuLight screening. 07/31/2021 upon evaluation today patient's wound is actually showing signs of improvement which is great news. Fortunately there does not appear to be any signs of infection currently locally nor systemically and I am very pleased in that regard. With that being said the patient is having some issues here with continued necrotic tissue I think packing with the Dakin's moistened gauze dressing is still in the right leg ago. Electronic Signature(s) Signed: 07/31/2021 3:57:02 PM By: Worthy Keeler PA-C Entered By: Worthy Keeler on 07/31/2021 15:57:02 -------------------------------------------------------------------------------- Physical Exam Details Patient Name: Date of Service: [name], [name] 07/31/2021 3:00 PM Medical Record Number: [id number] Patient Account Number: [id number] Date of Birth/Sex: Treating RN: 08/17/1951 (70 y.o. [name] Primary Care Provider: [name] Other Clinician: Referring Provider: Treating Provider/Extender: [name], [name]: 34 Constitutional [name] and well-hydrated in no acute distress. Respiratory normal breathing without difficulty. Psychiatric this patient is able to make decisions and demonstrates good insight into disease process. Alert and Oriented x 3. pleasant and cooperative. Notes Upon inspection patient's wound bed actually showed signs of having no blue-green discoloration that would be consistent with Pseudomonas and much happier in that regard. With that being said I  do not see any evidence of active infection systemically or locally and overall I think she is doing really quite well. Electronic Signature(s) Signed: 07/31/2021 3:57:19 PM By: Worthy Keeler PA-C Entered By: Worthy Keeler on 07/31/2021  15:57:19 -------------------------------------------------------------------------------- Physician Orders Details Patient Name: Date of Service: [name], [name] 07/31/2021 3:00 PM Medical Record Number: [id number] Patient Account Number: [id number] Date of Birth/Sex: Treating RN: [birth year] (70 y.o. [name] Primary Care Provider: [name] Other Clinician: Referring Provider: Treating Provider/Extender: [name], [name]: 52 Verbal / Phone Orders: No Diagnosis Coding ICD-10 Coding Code Description [phone] Non-pressure chronic ulcer of skin of other sites with fat layer exposed L58.9 Radiodermatitis, unspecified L59.8 Other specified disorders of the skin and subcutaneous tissue related to radiation I10 Essential (primary) hypertension Follow-up Appointments ppointment in 2 weeks. Jeri Cos PA Return A Bathing/ Shower/ Hygiene May shower and wash wound with soap and water. - on days that dressing is changed Additional Orders / Instructions Follow Nutritious Diet Wound Treatment Wound #1 - Breast (mastectomy site) Wound Laterality: Right Cleanser: Soap and Water 1 x Per Day/30 Days Discharge Instructions: May shower and wash wound with dial antibacterial soap and water prior to dressing change. Prim Dressing: Dakin's solution 1 x Per Day/30 Days ary Discharge Instructions: apply dakin's moistened gauze to wound bed Secondary Dressing: Woven Gauze Sponges 2x2 in (Generic) 1 x Per Day/30 Days Discharge Instructions: Apply over primary dressing as directed. Secondary Dressing: ABD Pad, 8x10 (Generic) 1 x Per Day/30 Days Discharge Instructions: Apply over primary dressing as directed. Secured With: 20M Medipore H Soft Cloth Surgical Tape, 2x2 (in/yd) (Generic) 1 x Per Day/30 Days Discharge Instructions: Secure dressing with tape as directed. Electronic Signature(s) Signed: 07/31/2021 4:03:51 PM By: Worthy Keeler PA-C Signed:  08/01/2021 5:50:03 PM By: Baruch Gouty RN, BSN Entered By: Baruch Gouty on 07/31/2021 15:52:30 -------------------------------------------------------------------------------- Problem List Details Patient Name: Date of Service: [name], [name] 07/31/2021 3:00 PM Medical Record Number: [id number] Patient Account Number: [id number] Date of Birth/Sex: Treating RN: 1951-06-15 (70 y.o. [name] Primary Care Provider: [name] Other Clinician: Referring Provider: Treating Provider/Extender: [name], [name]: 70 Active Problems ICD-10 Encounter Code Description Active Date MDM Diagnosis L98.492 Non-pressure chronic ulcer of skin of other sites with fat layer exposed 08/22/2020 No Yes L58.9 Radiodermatitis, unspecified 08/22/2020 No Yes L59.8 Other specified disorders of the skin and subcutaneous tissue related to 08/22/2020 No Yes radiation I10 Essential (primary) hypertension 08/22/2020 No Yes Inactive Problems Resolved Problems Electronic Signature(s) Signed: 07/31/2021 2:49:58 PM By: Worthy Keeler PA-C Entered By: Worthy Keeler on 07/31/2021 14:49:58 -------------------------------------------------------------------------------- Progress Note Details Patient Name: Date of Service: [name], [name] 07/31/2021 3:00 PM Medical Record Number: [id number] Patient Account Number: [id number] Date of Birth/Sex: Treating RN: 07/28/1951 (70 y.o. 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 She had  a mastectomy initially in 1998. She had chemotherapy only no radiation following. In 2002 she subsequently did undergo radiation for the first time and then subsequently in 2012 had repeat radiation after having had a finding that was consistent with  a relapse of the cancer. 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 She also has radiation damage to the skin over the right chest wall where she had the mastectomy. She was referred to [place] by Dr. Matthew Saras in [facility] and her oncologist is Dr. Burney Gauze. Subsequently I want to make sure before we delve into this more deeply that the patient does not have any issues with a return of cancer that needs to be managed by them. 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 Nonetheless she does not remember exactly when her last PET scan was but it was not too recently she tells me. 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 The patient does have hypertension as well. She sees her oncologist next on December 6. 09/12/2020 on evaluation today patient actually appears to be doing excellent in regard to her wound under the right breast location. Currently this is measuring smaller in general seems to be doing very well. She tells me that the collagen is doing a good job and that the dressing that she has been putting on is not causing any troubles as far pulling on her skin or scar tissue is concerned all of which is excellent news. 10/03/2020 upon evaluation today patient appears to be doing well with regard to her surgical site from her mastectomy on the right breast region. She tells me that she has had very little bleeding nothing seems to be sticking badly and in general she has been extremely pleased with where things stand today. No fevers,  chills, nausea, vomiting, or diarrhea. 10/24/2020 upon evaluation today patient actually appears to be doing excellent in regard to her wound. There is just a very small area still open and to be honest there was minimal slough noted on the surface of the wound nothing that requires sharp debridement. In general I feel like she is actually doing quite well and overall I am pleased. I do think we may switch to silver alginate dressing and also contemplating using a AandE ointment in order to help keep everything moist and the scar tissue region while the alginate keeps it dry enough to heal 11/14/2020 upon evaluation today patient appears to be doing well at this point in regard to her wound. I do feel like that she is making good progress again this is a very difficult region over the surgical site of the right chest wall at the site of mastectomy. Nonetheless I think that we are just a very small area still open I think alginate is doing a good job helping to dry this up and I would recommend this such that we continue with this. 01/02/2021 on evaluation today patient's wound actually appears to be doing decently well. There does not appear to be any signs of active infection which is great news. She does have some slight slough buildup with biofilm on the surface of the wound mainly more biofilm. Nonetheless I was able to gently remove this with saline and gauze as well as a sterile Q-tip. She tolerated that today without complication. 01/30/2021 upon evaluation today patient appears to be doing well with regard to her wound although she still has quite a bit of trouble getting this to close I think the scar tissue and radiation damage is quite  significant here unfortunately. There does not appear to be any signs of active infection which is great news. No fevers, chills, nausea, vomiting, or diarrhea. 02/27/2021 upon evaluation today patient appears to be doing excellent in regard to her wound. She has  been tolerating the dressing changes without complication and in general I am extremely pleased with where things stand today. There does not appear to be any signs of infection which is great news. No fevers, chills, nausea, vomiting, or diarrhea. With that being said I do think that she still developing some slough buildup. I did discuss with her today the possibility of proceeding with HBO therapy. We have had this discussion before but she really is not quite committed to wanting to do that much and spend that much time in the chamber. She wants to still think about it. 03/27/2021 upon evaluation today patient appears to be doing about the same in regard to her wound. She still developing a lot of slough buildup on the surface of the wound. I did try to clean that away to some degree today. She tolerated that without any pain or complication. Subsequently I am thinking we may want to switch over to [facility] see if this may do better for her. Patient is in agreement with giving that a trial. 05/01/2021 upon evaluation today patient appears to be doing about the same in regard to her wounds. She has been tolerating the dressing changes without complication. Fortunately there does not appear to be any signs of active infection at this time which is great news. No fevers, chills, nausea, vomiting, or diarrhea.. 06/05/2021 upon evaluation today patient appears to be doing pretty well currently in regard to her wound at the mastectomy site right chest wall. Overall since we switch back to the alginate she tells me things have significantly improved she is much happier with where things stand currently. Fortunately there does not appear to be any signs of active infection which is great news. No fevers, chills, nausea, vomiting, or diarrhea. 07/10/2021 upon evaluation today patient unfortunately does not appear to be doing nearly as well as what she previously was. There does not appear to be any evidence of  new epithelial growth she has a lot of necrotic tissue the base of the wound this is much more significant than what we previously noted. She also has been having increased pain and increased drainage. In general I am very concerned about this especially in light of the fact that she does have a history of breast cancer I want to ensure that were not looking at any cancerous type lesion at this point. Otherwise also think she does need some debridement we did do MolecuLight scanning today. 07/17/2021 upon evaluation today patient appears to be doing well with regard to her wound compared to last week although she still having some erythema I am concerned in this regard. I do think that she fortunately had a negative biopsy which is great news unfortunately she did have Pseudomonas noted as a bacteria present in the culture which I think is good and need to be addressed with Levaquin. I Minna send that into the pharmacy today. We did repeat the MolecuLight screening. 07/31/2021 upon evaluation today patient's wound is actually showing signs of improvement which is great news. Fortunately there does not appear to be any signs of infection currently locally nor systemically and I am very pleased in that regard. With that being said the patient is having some issues here with continued  necrotic tissue I think packing with the Dakin's moistened gauze dressing is still in the right leg ago. Objective Constitutional [name] and well-hydrated in no acute distress. Vitals Time Taken: 3:25 PM, Height: 63 in, Source: Stated, Weight: 176 lbs, Source: Stated, BMI: 31.2, Temperature: 98.5 F, Pulse: 87 bpm, Respiratory Rate: 18 breaths/min, Blood Pressure: 148/76 mmHg. Respiratory normal breathing without difficulty. Psychiatric this patient is able to make decisions and demonstrates good insight into disease process. Alert and Oriented x 3. pleasant and cooperative. General Notes: Upon inspection  patient's wound bed actually showed signs of having no blue-green discoloration that would be consistent with Pseudomonas and much happier in that regard. With that being said I do not see any evidence of active infection systemically or locally and overall I think she is doing really quite well. Integumentary (Hair, Skin) Wound #1 status is Open. Original cause of wound was Radiation Burn. The date acquired was: 07/26/2020. The wound has been in treatment 49 weeks. The wound is located on the Right Breast (mastectomy site). The wound measures 2cm length x 3.1cm width x 0.5cm depth; 4.869cm^2 area and 2.435cm^3 volume. There is Fat Layer (Subcutaneous Tissue) exposed. There is no tunneling or undermining noted. There is a medium amount of serous drainage noted. The wound margin is distinct with the outline attached to the wound base. There is small (1-33%) pink granulation within the wound bed. There is a large (67- 100%) amount of necrotic tissue within the wound bed including Adherent Slough. 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 There was a Excisional Skin/Subcutaneous Tissue Debridement with a total area of 6.2 sq cm performed by Worthy Keeler, PA. With the following instrument(s): Curette to remove Viable and Non-Viable tissue/material. Material removed includes Subcutaneous Tissue and Slough and after achieving pain control using Lidocaine 4% T opical Solution. No specimens were taken. A time out was conducted at 15:45, prior to the start of the procedure. A Minimum amount of bleeding was controlled with Pressure. The procedure was tolerated  well with a pain level of 0 throughout and a pain level of 0 following the procedure. Post Debridement Measurements: 2cm length x 3.1cm width x 0.5cm depth; 2.435cm^3 volume. Character of Wound/Ulcer Post Debridement requires further debridement. Post procedure Diagnosis Wound #1: Same as Pre-Procedure Plan Follow-up Appointments: Return Appointment in 2 weeks. Jeri Cos PA Bathing/ Shower/ Hygiene: May shower and wash wound with soap and water. - on days that dressing is changed Additional Orders / Instructions: Follow Nutritious Diet WOUND #1: - Breast (mastectomy site) Wound Laterality: Right Cleanser: Soap and Water 1 x Per Day/30 Days Discharge Instructions: May shower and wash wound with dial antibacterial soap and water prior to dressing change. Prim Dressing: Dakin's solution 1 x Per Day/30 Days ary Discharge Instructions: apply dakin's moistened gauze to wound bed Secondary Dressing: Woven Gauze Sponges 2x2 in (Generic) 1 x Per Day/30 Days Discharge Instructions: Apply over primary dressing as directed. Secondary Dressing: ABD Pad, 8x10 (Generic) 1 x Per Day/30 Days Discharge Instructions: Apply over primary dressing as directed. Secured With: 22M Medipore H Soft Cloth Surgical T ape, 2x2 (in/yd) (Generic) 1 x Per Day/30 Days Discharge Instructions: Secure dressing with tape as directed. 1. Would recommend  that we going continue with the wound care measures as before and the patient is in agreement with the plan this includes the use of the Dakin's moistened gauze dressing which I think is still doing a great job. 2. I am also going to recommend that we have the patient continue to monitor for signs of worsening or infection. Obviously if she has any major issues with increased drainage, redness, pain, or any other signs that this may be developing into a worse infection she should let me know soon as possible. We will see patient back for reevaluation in 2 weeks here in the  clinic. If anything worsens or changes patient will contact our office for additional recommendations. Electronic Signature(s) Signed: 07/31/2021 3:58:15 PM By: Worthy Keeler PA-C Entered By: Worthy Keeler on 07/31/2021 15:58:15 -------------------------------------------------------------------------------- SuperBill Details Patient Name: Date of Service: [name], [name] 07/31/2021 Medical Record Number: [id number] Patient Account Number: [id number] Date of Birth/Sex: Treating RN: [birth year] (70 y.o. [name] Primary Care Provider: [name] Other Clinician: Referring Provider: Treating Provider/Extender: [name], [name]: 49 Diagnosis Coding ICD-10 Codes Code Description [phone] Non-pressure chronic ulcer of skin of other sites with fat layer exposed L58.9 Radiodermatitis, unspecified L59.8 Other specified disorders of the skin and subcutaneous tissue related to radiation I10 Essential (primary) hypertension Facility Procedures The patient participates with Medicare or their insurance follows the Medicare Facility Guidelines: CPT4 Code Description Modifier Quantity [id number] [place] TISSUE 20 SQ CM/< 1 ICD-10 Diagnosis Description L98.492 Non-pressure chronic ulcer of  skin of other sites with fat layer exposed Physician Procedures Electronic Signature(s) Signed: 07/31/2021 3:58:27 PM By: Worthy Keeler PA-C Entered By: Worthy Keeler on 07/31/2021 15:58:27

## 2021-08-01 NOTE — Progress Notes (Signed)
[name], [name] ([id number]) Visit Report for 07/31/2021 Arrival Information Details Patient Name: Date of Service: [name], [place] YE 07/31/2021 3:00 PM Medical Record Number: [id number] Patient Account Number: [id number] Date of Birth/Sex: Treating RN: May 31, 1951 (70 y.o. [name], [name] Primary Care Provider: [name] Other Clinician: Referring Provider: Treating Provider/Extender: [name], [name]: 56 Visit Information History Since Last Visit Added or deleted any medications: No Patient Arrived: Ambulatory Any new allergies or adverse reactions: No Arrival Time: 15:25 Had a fall or experienced change in No Accompanied By: self activities of daily living that may affect Transfer Assistance: None risk of falls: Patient Identification Verified: Yes Signs or symptoms of abuse/neglect since last visito No Secondary Verification Process Completed: Yes Hospitalized since last visit: No Patient Requires Transmission-Based Precautions: No Implantable device outside of the clinic excluding No Patient Has Alerts: No cellular tissue based products placed in the center since last visit: Has Dressing in Place as Prescribed: Yes Pain Present Now: No Electronic Signature(s) Signed: 08/01/2021 5:50:03 PM By: Baruch Gouty RN, BSN Entered By: Baruch Gouty on 07/31/2021 15:25:30 -------------------------------------------------------------------------------- Encounter Discharge Information Details Patient Name: Date of Service: [name], [name] 07/31/2021 3:00 PM Medical Record Number: [id number] Patient Account Number: [id number] Date of Birth/Sex: Treating RN: [birth year] (70 y.o. [name] Primary Care Provider: [name] Other Clinician: Referring Provider: Treating Provider/Extender: [name], [name]: 23 Encounter Discharge Information Items Post Procedure Vitals Discharge Condition:  Stable Temperature (F): 98.5 Ambulatory Status: Ambulatory Pulse (bpm): 87 Discharge Destination: Home Respiratory Rate (breaths/min): 18 Transportation: Private Auto Blood Pressure (mmHg): 148/76 Accompanied By: self Schedule Follow-up Appointment: Yes Clinical Summary of Care: Patient Declined Electronic Signature(s) Signed: 08/01/2021 5:50:03 PM By: Baruch Gouty RN, BSN Entered By: Baruch Gouty on 07/31/2021 15:55:50 -------------------------------------------------------------------------------- Lower Extremity Assessment Details Patient Name: Date of Service: [name], [name] 07/31/2021 3:00 PM Medical Record Number: [id number] Patient Account Number: [id number] Date of Birth/Sex: Treating RN: 1951-05-10 (70 y.o. [name] Primary Care Provider: [name] Other Clinician: Referring Provider: Treating Provider/Extender: [name], [name]: 16 Electronic Signature(s) Signed: 08/01/2021 5:50:03 PM By: Baruch Gouty RN, BSN Entered By: Baruch Gouty on 07/31/2021 15:29:53 -------------------------------------------------------------------------------- Multi-Disciplinary Care Plan Details Patient Name: Date of Service: [name], [name] 07/31/2021 3:00 PM Medical Record Number: [id number] Patient Account Number: [id number] Date of Birth/Sex: Treating RN: 1950-12-08 (70 y.o. [name] Primary Care Provider: [name] Other Clinician: Referring Provider: Treating Provider/Extender: [name], [name] in Treatment: 31 [place] reviewed with physician Active Inactive Necrotic Tissue Nursing Diagnoses: Knowledge deficit related to management of necrotic/devitalized tissue Goals: Necrotic/devitalized tissue will be minimized in the wound bed Date Initiated: 07/10/2021 Target Resolution Date: 08/16/2021 Goal Status: Active Interventions: Provide education on  necrotic tissue and debridement process Treatment Activities: Excisional debridement : 07/10/2021 Notes: Soft Tissue Infection Nursing Diagnoses: Impaired tissue integrity Knowledge deficit related to disease process and management Goals: Patient's soft tissue infection will resolve Date Initiated: 07/10/2021 Target Resolution Date: 08/09/2021 Goal Status: Active Interventions: Assess signs and symptoms of infection every visit Provide education on infection Treatment Activities: Culture : 07/10/2021 Education provided on Infection : 07/17/2021 Systemic antibiotics : 07/10/2021 T ordered outside of clinic : 07/10/2021 est Notes: Wound/Skin Impairment Nursing Diagnoses: Impaired tissue integrity Knowledge deficit related to ulceration/compromised skin integrity Goals: Patient/caregiver will verbalize understanding of skin care regimen Date Initiated: 08/22/2020 Target Resolution Date: 08/14/2021 Goal Status:  Active Ulcer/skin breakdown will have a volume reduction of 30% by week 4 Date Initiated: 08/22/2020 Date Inactivated: 10/03/2020 Target Resolution Date: 09/19/2020 Goal Status: Met Ulcer/skin breakdown will have a volume reduction of 50% by week 8 Date Initiated: 06/05/2021 Date Inactivated: 07/17/2021 Target Resolution Date: 07/03/2021 Goal Status: Unmet Unmet Reason: infection Interventions: Assess patient/caregiver ability to obtain necessary supplies Assess patient/caregiver ability to perform ulcer/skin care regimen upon admission and as needed Assess ulceration(s) every visit Treatment Activities: Skin care regimen initiated : 08/22/2020 Topical wound management initiated : 08/22/2020 Notes: 06/05/21: Wound care regimen ongoing. Electronic Signature(s) Signed: 08/01/2021 5:50:03 PM By: Baruch Gouty RN, BSN Entered By: Baruch Gouty on 07/31/2021 15:39:47 -------------------------------------------------------------------------------- Pain Assessment  Details Patient Name: Date of Service: [name], [name] 07/31/2021 3:00 PM Medical Record Number: [id number] Patient Account Number: [id number] Date of Birth/Sex: Treating RN: [birth year] (70 y.o. [name] Primary Care Provider: [name] Other Clinician: Referring Provider: Treating Provider/Extender: [name], [name]: 27 Active Problems Location of Pain Severity and Description of Pain Patient Has Paino No Site Locations Rate the pain. Rate the pain. Current Pain Level: 0 Pain Management and Medication Current Pain Management: Electronic Signature(s) Signed: 08/01/2021 5:50:03 PM By: Baruch Gouty RN, BSN Entered By: Baruch Gouty on 07/31/2021 15:26:08 -------------------------------------------------------------------------------- Patient/Caregiver Education Details Patient Name: Date of Service: GO Ricard Dillon, [name] 10/19/2022andnbsp3:00 PM Medical Record Number: [id number] Patient Account Number: [id number] Date of Birth/Gender: Treating RN: 26-Jan-1951 (70 y.o. [name] Primary Care Physician: [name] Other Clinician: Referring Physician: Treating Physician/Extender: [name], [name] in Treatment: 42 Education Assessment Education Provided To: Patient Education Topics Provided Infection: Methods: Explain/Verbal Responses: Reinforcements needed, State content correctly Wound Debridement: Methods: Explain/Verbal Responses: Reinforcements needed, State content correctly Electronic Signature(s) Signed: 08/01/2021 5:50:03 PM By: Baruch Gouty RN, BSN Entered By: Baruch Gouty on 07/31/2021 15:40:08 -------------------------------------------------------------------------------- Wound Assessment Details Patient Name: Date of Service: [name], [name] 07/31/2021 3:00 PM Medical Record Number: [id number] Patient Account Number: [id number] Date of Birth/Sex: Treating  RN: March 16, 1951 (70 y.o. [name], [name] Primary Care Provider: [name] Other Clinician: Referring Provider: Treating Provider/Extender: [name], [name]: 49 Wound Status Wound Number: 1 Primary Soft Tissue Radionecrosis Etiology: Wound Location: Right Breast (mastectomy site) Wound Open Wounding Event: Radiation Burn Status: Date Acquired: 07/26/2020 Comorbid Anemia, Lymphedema, Deep Vein Thrombosis, Hypertension, Weeks Of Treatment: 49 History: Peripheral Venous Disease, Osteoarthritis, Received Clustered Wound: No Chemotherapy, Received Radiation, Confinement Anxiety Photos Wound Measurements Length: (cm) 2 Width: (cm) 3.1 Depth: (cm) 0.5 Area: (cm) 4.869 Volume: (cm) 2.435 % Reduction in Area: -217.8% % Reduction in Volume: -1491.5% Epithelialization: None Tunneling: No Undermining: No Wound Description Classification: Full Thickness Without Exposed Support Structures Wound Margin: Distinct, outline attached Exudate Amount: Medium Exudate Type: Serous Exudate Color: amber Foul Odor After Cleansing: No Slough/Fibrino Yes Wound Bed Granulation Amount: Small (1-33%) Exposed Structure Granulation Quality: Pink Fascia Exposed: No Necrotic Amount: Large (67-100%) Fat Layer (Subcutaneous Tissue) Exposed: Yes Necrotic Quality: Adherent Slough Tendon Exposed: No Muscle Exposed: No Joint Exposed: No Bone Exposed: No Treatment Notes Wound #1 (Breast (mastectomy site)) Wound Laterality: Right Cleanser Soap and Water Discharge Instruction: May shower and wash wound with dial antibacterial soap and water prior to dressing change. Peri-Wound Care Topical Primary Dressing Dakin's solution Discharge Instruction: apply dakin's moistened gauze to wound bed Secondary Dressing Woven Gauze Sponges 2x2 in Discharge Instruction: Apply over primary dressing as directed. ABD Pad, 8x10  Discharge Instruction: Apply over primary  dressing as directed. Secured With 10M [place] Surgical T ape, 2x2 (in/yd) Discharge Instruction: Secure dressing with tape as directed. Compression Wrap Compression Stockings Add-Ons Electronic Signature(s) Signed: 08/01/2021 5:50:03 PM By: Baruch Gouty RN, BSN Entered By: Baruch Gouty on 07/31/2021 15:36:07 -------------------------------------------------------------------------------- Vitals Details Patient Name: Date of Service: [name], [name] 07/31/2021 3:00 PM Medical Record Number: [id number] Patient Account Number: [id number] Date of Birth/Sex: Treating RN: 12/11/1950 (70 y.o. [name] Primary Care Khaylee Mcevoy: [name] Other Clinician: Referring Arash Karstens: Treating Aniah Pauli/Extender: [name], [name]: 49 Vital Signs Time Taken: 15:25 Temperature (F): 98.5 Height (in): 63 Pulse (bpm): 87 Source: Stated Respiratory Rate (breaths/min): 18 Weight (lbs): 176 Blood Pressure (mmHg): 148/76 Source: Stated Reference Range: 80 - 120 mg / dl Body Mass Index (BMI): 31.2 Electronic Signature(s) Signed: 08/01/2021 5:50:03 PM By: Baruch Gouty RN, BSN Entered By: Baruch Gouty on 07/31/2021 15:25:58

## 2021-08-13 ENCOUNTER — Inpatient Hospital Stay (HOSPITAL_BASED_OUTPATIENT_CLINIC_OR_DEPARTMENT_OTHER): Payer: Medicare Other | Admitting: Hematology & Oncology

## 2021-08-13 ENCOUNTER — Other Ambulatory Visit: Payer: Self-pay

## 2021-08-13 ENCOUNTER — Inpatient Hospital Stay: Payer: Medicare Other | Attending: Hematology & Oncology

## 2021-08-13 ENCOUNTER — Encounter: Payer: Self-pay | Admitting: Hematology & Oncology

## 2021-08-13 VITALS — BP 153/72 | HR 80 | Temp 98.6°F | Resp 18 | Wt 187.5 lb

## 2021-08-13 DIAGNOSIS — Z79811 Long term (current) use of aromatase inhibitors: Secondary | ICD-10-CM | POA: Diagnosis not present

## 2021-08-13 DIAGNOSIS — D638 Anemia in other chronic diseases classified elsewhere: Secondary | ICD-10-CM | POA: Diagnosis not present

## 2021-08-13 DIAGNOSIS — C50011 Malignant neoplasm of nipple and areola, right female breast: Secondary | ICD-10-CM

## 2021-08-13 DIAGNOSIS — Z923 Personal history of irradiation: Secondary | ICD-10-CM | POA: Diagnosis not present

## 2021-08-13 DIAGNOSIS — C50911 Malignant neoplasm of unspecified site of right female breast: Secondary | ICD-10-CM | POA: Diagnosis not present

## 2021-08-13 DIAGNOSIS — Z7982 Long term (current) use of aspirin: Secondary | ICD-10-CM | POA: Insufficient documentation

## 2021-08-13 DIAGNOSIS — M818 Other osteoporosis without current pathological fracture: Secondary | ICD-10-CM | POA: Diagnosis not present

## 2021-08-13 DIAGNOSIS — D509 Iron deficiency anemia, unspecified: Secondary | ICD-10-CM | POA: Insufficient documentation

## 2021-08-13 DIAGNOSIS — D500 Iron deficiency anemia secondary to blood loss (chronic): Secondary | ICD-10-CM

## 2021-08-13 DIAGNOSIS — M25461 Effusion, right knee: Secondary | ICD-10-CM | POA: Diagnosis not present

## 2021-08-13 LAB — CBC WITH DIFFERENTIAL (CANCER CENTER ONLY)
Abs Immature Granulocytes: 0.01 10*3/uL (ref 0.00–0.07)
Basophils Absolute: 0 10*3/uL (ref 0.0–0.1)
Basophils Relative: 0 %
Eosinophils Absolute: 0.2 10*3/uL (ref 0.0–0.5)
Eosinophils Relative: 4 %
HCT: 32.8 % — ABNORMAL LOW (ref 36.0–46.0)
Hemoglobin: 10.2 g/dL — ABNORMAL LOW (ref 12.0–15.0)
Immature Granulocytes: 0 %
Lymphocytes Relative: 34 %
Lymphs Abs: 1.8 10*3/uL (ref 0.7–4.0)
MCH: 27.7 pg (ref 26.0–34.0)
MCHC: 31.1 g/dL (ref 30.0–36.0)
MCV: 89.1 fL (ref 80.0–100.0)
Monocytes Absolute: 0.4 10*3/uL (ref 0.1–1.0)
Monocytes Relative: 7 %
Neutro Abs: 3 10*3/uL (ref 1.7–7.7)
Neutrophils Relative %: 55 %
Platelet Count: 198 10*3/uL (ref 150–400)
RBC: 3.68 MIL/uL — ABNORMAL LOW (ref 3.87–5.11)
RDW: 13.6 % (ref 11.5–15.5)
WBC Count: 5.4 10*3/uL (ref 4.0–10.5)
nRBC: 0 % (ref 0.0–0.2)

## 2021-08-13 LAB — CMP (CANCER CENTER ONLY)
ALT: 9 U/L (ref 0–44)
AST: 12 U/L — ABNORMAL LOW (ref 15–41)
Albumin: 3.9 g/dL (ref 3.5–5.0)
Alkaline Phosphatase: 60 U/L (ref 38–126)
Anion gap: 7 (ref 5–15)
BUN: 16 mg/dL (ref 8–23)
CO2: 30 mmol/L (ref 22–32)
Calcium: 9.7 mg/dL (ref 8.9–10.3)
Chloride: 106 mmol/L (ref 98–111)
Creatinine: 0.63 mg/dL (ref 0.44–1.00)
GFR, Estimated: >60 mL/min (ref >60)
Glucose, Bld: 110 mg/dL — ABNORMAL HIGH (ref 70–99)
Potassium: 3.7 mmol/L (ref 3.5–5.1)
Sodium: 143 mmol/L (ref 135–145)
Total Bilirubin: 0.4 mg/dL (ref 0.3–1.2)
Total Protein: 7.4 g/dL (ref 6.5–8.1)

## 2021-08-13 LAB — RETICULOCYTES
Immature Retic Fract: 14.2 % (ref 2.3–15.9)
RBC.: 3.64 MIL/uL — ABNORMAL LOW (ref 3.87–5.11)
Retic Count, Absolute: 55 10*3/uL (ref 19.0–186.0)
Retic Ct Pct: 1.5 % (ref 0.4–3.1)

## 2021-08-13 LAB — IRON AND TIBC
Iron: 32 ug/dL — ABNORMAL LOW (ref 41–142)
Saturation Ratios: 16 % — ABNORMAL LOW (ref 21–57)
TIBC: 201 ug/dL — ABNORMAL LOW (ref 236–444)
UIBC: 169 ug/dL (ref 120–384)

## 2021-08-13 LAB — FERRITIN: Ferritin: 516 ng/mL — ABNORMAL HIGH (ref 11–307)

## 2021-08-13 LAB — VITAMIN D 25 HYDROXY (VIT D DEFICIENCY, FRACTURES): Vit D, 25-Hydroxy: 50.79 ng/mL (ref 30–100)

## 2021-08-13 NOTE — Progress Notes (Signed)
Hematology and Oncology Follow Up Visit  [name] [id number] [birth year] 70 y.o. 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 Aromasin 25 mg p.o. daily - on hold start on 03/18/2021 2. Aspirin 81 mg p.o. daily 3.  Feraheme-510 mg IV as needed.  Last dose given on 05/07/2021  Interim History:  [name] is here today for follow-up.  She is still bothered by the arthritis.  I think that her left knee is causing her a lot of problems.  She probably can have to see about getting this taken care of.  She does not want surgery.  Her last iron studies that were done back in July showed a ferritin of 514 with an iron saturation of 18%.  She did get some IV iron in July.  Her last erythropoietin level that we checked on her was actually about 9 years ago.  We probably need to get another one on her.  Despite the iron that we give her, her hemoglobin really does not come of all that much.  We may have to think about using Aranesp or Retacrit if she has a low erythropoietin level.  She has had no problems with bleeding.  There has been no issues with nausea or vomiting.  She has had no cough.  She has had no change in bowel or bladder habits.  She does have lymphedema in her right arm.  This is been stable.  Overall, I would say performance status is probably ECOG 1.  Ultrasound was never proven we will   Medications:  Allergies as of 08/13/2021       Reactions   Contrast Media [iodinated Diagnostic Agents] Hives, Shortness Of Breath, Nausea And Vomiting   Allergic reaction even after pre-meds.   Metrizamide Hives, Shortness Of Breath, Nausea And Vomiting   Allergic reaction even after pre-meds.   Other Shortness Of Breath, Nausea And Vomiting, Rash   Allergic reaction even after pre-meds.   Dye Fdc Blue [brilliant Blue Fcf (fd&c Blue #1)]  Hives, Nausea Only   Fd&c Blue #1 (brilliant Blue Fcf) Hives, Nausea Only        Medication List        Accurate as of August 13, 2021 12:06 PM. If you have any questions, ask your nurse or doctor.          amLODipine 10 MG tablet Commonly known as: NORVASC Take 10 mg by mouth daily.   aspirin EC 81 MG tablet Take 81 mg by mouth daily.   candesartan 32 MG tablet Commonly known as: ATACAND Take 32 mg by mouth daily.   exemestane 25 MG tablet Commonly known as: AROMASIN TAKE 1 TABLET BY MOUTH EVERY DAY   hydrochlorothiazide 25 MG tablet Commonly known as: HYDRODIURIL Take 25 mg by mouth daily.   ibuprofen 200 MG tablet Commonly known as: ADVIL Take 200 mg by mouth every 6 (six) hours as needed.   meloxicam 7.5 MG tablet Commonly known as: MOBIC Take 7.5 mg by mouth daily as needed.   methylPREDNISolone 4 MG Tbpk tablet Commonly known as: MEDROL DOSEPAK Take as directed   metoprolol succinate 50 MG 24 hr tablet Commonly known as: TOPROL-XL Take 50 mg by mouth daily.   simvastatin 20 MG tablet Commonly known as: ZOCOR Take 20 mg by mouth daily.   traMADol 50  MG tablet Commonly known as: ULTRAM Take 1 tablet (50 mg total) by mouth every 6 (six) hours as needed.   Vitamin D (Ergocalciferol) 1.25 MG (50000 UNIT) Caps capsule Commonly known as: DRISDOL TAKE 1 CAPSULE BY MOUTH ONE TIME PER WEEK        Allergies:  Allergies  Allergen Reactions   Contrast Media [Iodinated Diagnostic Agents] Hives, Shortness Of Breath and Nausea And Vomiting    Allergic reaction even after pre-meds.   Metrizamide Hives, Shortness Of Breath and Nausea And Vomiting    Allergic reaction even after pre-meds.   Other Shortness Of Breath, Nausea And Vomiting and Rash    Allergic reaction even after pre-meds.   Dye Fdc Blue [Brilliant Blue Fcf (Fd&C Blue #1)] Hives and Nausea Only   Fd&C Blue #1 (Brilliant Blue Fcf) Hives and Nausea Only    Past Medical History, Surgical  history, Social history, and Family History were reviewed and updated.  Review of Systems: Review of Systems  Constitutional: Negative.   HENT:  Positive for hearing loss.   Eyes:  Positive for discharge.  Respiratory: Negative.    Cardiovascular: Negative.   Gastrointestinal: Negative.   Genitourinary: Negative.   Musculoskeletal: Negative.   Skin: Negative.   Neurological: Negative.   Endo/Heme/Allergies: Negative.   Psychiatric/Behavioral: Negative.      Physical Exam:  weight is 187 lb 8 oz (85 kg). Her oral temperature is 98.6 F (37 C). Her blood pressure is 153/72 (abnormal) and her pulse is 80. Her respiration is 18 and oxygen saturation is 100%.   Wt Readings from Last 3 Encounters:  08/13/21 187 lb 8 oz (85 kg)  06/25/21 185 lb (83.9 kg)  05/07/21 181 lb 0.5 oz (82.1 kg)   Physical Exam Vitals reviewed.  Constitutional:      Comments: On her breast exam, her left breast is without any masses, edema or erythema.  There is no left axillary adenopathy.  There is no discharge from the nipple on the left breast.  Right chest wall shows radiation dermatitis which is about the same.  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  There is no obvious exudate.  There is no tenderness.  There is no right axillary adenopathy.  HENT:     Head: Normocephalic and atraumatic.  Eyes:     Pupils: Pupils are equal, round, and reactive to light.     Comments: Her ocular exam does show this stye on the upper eyelid of the left eye.  There is an area of purulent material that looks like he might be coming out soon.  She has good extraocular muscle movement.  Pupils react appropriately.  Cardiovascular:     Rate and Rhythm: Normal rate and regular rhythm.     Heart sounds: Normal heart sounds.  Pulmonary:     Effort: Pulmonary effort is normal.     Breath sounds: Normal breath sounds.  Abdominal:     General: Bowel sounds are normal.     Palpations: Abdomen is soft.   Musculoskeletal:        General: No tenderness or deformity. Normal range of motion.     Cervical back: Normal range of motion.  Lymphadenopathy:     Cervical: No cervical adenopathy.  Skin:    General: Skin is warm and dry.     Findings: No erythema or rash.  Neurological:     Mental Status: She is alert and oriented to person, place, and time.  Psychiatric:  Behavior: Behavior normal.        Thought Content: Thought content normal.        Judgment: Judgment normal.      Lab Results  Component Value Date   WBC 5.4 08/13/2021   HGB 10.2 (L) 08/13/2021   HCT 32.8 (L) 08/13/2021   MCV 89.1 08/13/2021   PLT 198 08/13/2021   Lab Results  Component Value Date   FERRITIN 514 (H) 04/29/2021   IRON 36 (L) 04/29/2021   TIBC 204 (L) 04/29/2021   UIBC 168 04/29/2021   IRONPCTSAT 18 (L) 04/29/2021   Lab Results  Component Value Date   RETICCTPCT 1.5 08/13/2021   RBC 3.68 (L) 08/13/2021   RETICCTABS 55.2 06/20/2011   No results found for: KPAFRELGTCHN, LAMBDASER, KAPLAMBRATIO No results found for: Kandis Cocking [facility] Lab Results  Component Value Date   TOTALPROTELP 6.6 08/14/2010   ALBUMINELP 53.8 (L) 08/14/2010   A1GS 5.3 (H) 08/14/2010   A2GS 14.3 (H) 08/14/2010   BETS 5.8 08/14/2010   BETA2SER 6.3 08/14/2010   GAMS 14.5 08/14/2010   MSPIKE NOT DET 08/14/2010   SPEI * 08/14/2010     Chemistry      Component Value Date/Time   NA 143 08/13/2021 0956   NA 146 (H) 07/23/2017 0900   NA 144 02/18/2017 0901   K 3.7 08/13/2021 0956   K 3.7 07/23/2017 0900   K 3.7 02/18/2017 0901   CL 106 08/13/2021 0956   CL 107 07/23/2017 0900   CO2 30 08/13/2021 0956   CO2 31 07/23/2017 0900   CO2 27 02/18/2017 0901   BUN 16 08/13/2021 0956   BUN 16 07/23/2017 0900   BUN 17.7 02/18/2017 0901   CREATININE 0.63 08/13/2021 0956   CREATININE 0.8 07/23/2017 0900   CREATININE 0.7 02/18/2017 0901      Component Value Date/Time   CALCIUM 9.7 08/13/2021 0956   CALCIUM  9.3 07/23/2017 0900   CALCIUM 9.3 02/18/2017 0901   ALKPHOS 60 08/13/2021 0956   ALKPHOS 67 07/23/2017 0900   ALKPHOS 78 02/18/2017 0901   AST 12 (L) 08/13/2021 0956   AST 13 02/18/2017 0901   ALT 9 08/13/2021 0956   ALT 20 07/23/2017 0900   ALT 12 02/18/2017 0901   BILITOT 0.4 08/13/2021 0956   BILITOT 0.54 02/18/2017 0901       Impression and Plan: Ms. Muecke is a very pleasant 71 yo caucasian female with history of locally recurrent adenocarcinoma of the right breast with resection and radiation.    We will see what her iron studies look like.  Otherwise, everything is looking okay with the breast cancer itself.  We will try to get her back after all the holidays now.  I am just happy that she is doing a little better.  Again we may have to think about using ESA to try to get her hemoglobin low but higher.    [name], MD 11/1/202212:06 PM

## 2021-08-14 ENCOUNTER — Encounter (HOSPITAL_BASED_OUTPATIENT_CLINIC_OR_DEPARTMENT_OTHER): Payer: Medicare Other | Attending: Physician Assistant | Admitting: Physician Assistant

## 2021-08-14 DIAGNOSIS — Z9011 Acquired absence of right breast and nipple: Secondary | ICD-10-CM | POA: Insufficient documentation

## 2021-08-14 DIAGNOSIS — Z86718 Personal history of other venous thrombosis and embolism: Secondary | ICD-10-CM | POA: Diagnosis not present

## 2021-08-14 DIAGNOSIS — L598 Other specified disorders of the skin and subcutaneous tissue related to radiation: Secondary | ICD-10-CM | POA: Insufficient documentation

## 2021-08-14 DIAGNOSIS — L589 Radiodermatitis, unspecified: Secondary | ICD-10-CM | POA: Insufficient documentation

## 2021-08-14 DIAGNOSIS — Z9221 Personal history of antineoplastic chemotherapy: Secondary | ICD-10-CM | POA: Diagnosis not present

## 2021-08-14 DIAGNOSIS — I10 Essential (primary) hypertension: Secondary | ICD-10-CM | POA: Insufficient documentation

## 2021-08-14 DIAGNOSIS — Z853 Personal history of malignant neoplasm of breast: Secondary | ICD-10-CM | POA: Insufficient documentation

## 2021-08-14 DIAGNOSIS — L98492 Non-pressure chronic ulcer of skin of other sites with fat layer exposed: Secondary | ICD-10-CM | POA: Insufficient documentation

## 2021-08-14 LAB — CANCER ANTIGEN 27.29: CA 27.29: 17.2 U/mL (ref 0.0–38.6)

## 2021-08-14 NOTE — Progress Notes (Addendum)
[name], [name] ([id number]) Visit Report for 08/14/2021 Chief Complaint Document Details Patient Name: Date of Service: [name] YE 08/14/2021 9:30 A M Medical Record Number: [id number] Patient Account Number: [id number] Date of Birth/Sex: Treating RN: 11-18-1950 (70 y.o. 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 [name], [name] Primary Care Provider: [name] Other Clinician: Referring Provider: Treating Provider/Extender: [name], [name]: 51 Debridement Performed for Assessment: Wound #1 Right Breast (mastectomy site) Performed By: Physician Worthy Keeler, PA Debridement Type: Debridement Level of Consciousness (Pre-procedure): Awake and Alert Pre-procedure Verification/Time Out Yes - 10:10 Taken: Start Time: 10:11 Pain Control: Lidocaine 5% topical ointment T Area Debrided (L x W): otal 1.8 (cm) x 3.2 (cm) = 5.76 (cm) Tissue and other material debrided: Viable, Non-Viable, Slough, Subcutaneous, Skin: Dermis , Skin: Epidermis, Fibrin/Exudate, Slough Level: Skin/Subcutaneous Tissue Debridement Description: Excisional Instrument: Curette Bleeding: Minimum Hemostasis Achieved: Pressure End Time: 10:17 Procedural Pain: 0 Post Procedural Pain: 0 Response to Treatment:  Procedure was tolerated well Level of Consciousness (Post- Awake and Alert procedure): Post Debridement Measurements of Total Wound Length: (cm) 1.8 Width: (cm) 3.2 Depth: (cm) 0.6 Volume: (cm) 2.714 Character of Wound/Ulcer Post Debridement: Requires Further Debridement Post Procedure Diagnosis Same as Pre-procedure Electronic Signature(s) Signed: 08/14/2021 6:26:15 PM By: Worthy Keeler PA-C Signed: 08/15/2021 6:11:26 PM By: [name] RN, BSN Entered By: [name] on 08/14/2021 10:18:04 -------------------------------------------------------------------------------- HPI Details Patient Name: Date of Service: [name], [name] 08/14/2021 9:30 A M Medical Record Number: [id number] Patient Account Number: [id number] Date of Birth/Sex: Treating RN: 05/22/1951 (70 y.o. [name] Primary Care Provider: [name] Other Clinician: Referring Provider: Treating Provider/Extender: [name], [name]: 59 History of Present Illness HPI Description: 08/22/2020 upon evaluation today patient actually appears to be doing somewhat poorly in regard to her mastectomy site on the right chest wall. She had a mastectomy initially in 1998. She had chemotherapy only no radiation following. In 2002 she subsequently did undergo radiation for the first time and then subsequently in 2012 had repeat radiation after having had a finding that was consistent with a relapse of the cancer. 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 She also has radiation damage to the skin over the right chest wall where she had the mastectomy. She was referred to [place] by Dr. Matthew Saras in [facility] and her oncologist is Dr. Burney Gauze. Subsequently I want to make sure before we delve into this more deeply that the patient does not have any issues with a return of cancer that needs to be managed by them. Obviously she does  have significant scar tissue which may be benefited secondary to the soft tissue radionecrosis by hyperbaric oxygen therapy in the absence of any recurrence of the cancer. Nonetheless she does not remember exactly when her last PET scan was but it was not too recently she tells me. She does have a history of a DVT in the right  leg in 2013. The patient does have hypertension as well. She sees her oncologist next on December 6. 09/12/2020 on evaluation today patient actually appears to be doing excellent in regard to her wound under the right breast location. Currently this is measuring smaller in general seems to be doing very well. She tells me that the collagen is doing a good job and that the dressing that she has been putting on is not causing any troubles as far pulling on her skin or scar tissue is concerned all of which is excellent news. 10/03/2020 upon evaluation today patient appears to be doing well with regard to her surgical site from her mastectomy on the right breast region. She tells me that she has had very little bleeding nothing seems to be sticking badly and in general she has been extremely pleased with where things stand today. No fevers, chills, nausea, vomiting, or diarrhea. 10/24/2020 upon evaluation today patient actually appears to be doing excellent in regard to her wound. There is just a very small area still open and to be honest there was minimal slough noted on the surface of the wound nothing that requires sharp debridement. In general I feel like she is actually doing quite well and overall I am pleased. I do think we may switch to silver alginate dressing and also contemplating using a AandE ointment in order to help keep everything moist and the scar tissue region while the alginate keeps it dry enough to heal 11/14/2020 upon evaluation today patient appears to be doing well at this point in regard to her wound. I do feel like that she is making good progress again this  is a very difficult region over the surgical site of the right chest wall at the site of mastectomy. Nonetheless I think that we are just a very small area still open I think alginate is doing a good job helping to dry this up and I would recommend this such that we continue with this. 01/02/2021 on evaluation today patient's wound actually appears to be doing decently well. There does not appear to be any signs of active infection which is great news. She does have some slight slough buildup with biofilm on the surface of the wound mainly more biofilm. Nonetheless I was able to gently remove this with saline and gauze as well as a sterile Q-tip. She tolerated that today without complication. 01/30/2021 upon evaluation today patient appears to be doing well with regard to her wound although she still has quite a bit of trouble getting this to close I think the scar tissue and radiation damage is quite significant here unfortunately. There does not appear to be any signs of active infection which is great news. No fevers, chills, nausea, vomiting, or diarrhea. 02/27/2021 upon evaluation today patient appears to be doing excellent in regard to her wound. She has been tolerating the dressing changes without complication and in general I am extremely pleased with where things stand today. There does not appear to be any signs of infection which is great news. No fevers, chills, nausea, vomiting, or diarrhea. With that being said I do think that she still developing some slough buildup. I did discuss with her today the possibility of proceeding with HBO therapy. We have had this discussion before but she really is not quite committed to wanting to do that much and spend that much time in the chamber. She wants to still think about it. 03/27/2021 upon evaluation today patient appears to be  doing about the same in regard to her wound. She still developing a lot of slough buildup on the surface of the wound. I  did try to clean that away to some degree today. She tolerated that without any pain or complication. Subsequently I am thinking we may want to switch over to [facility] see if this may do better for her. Patient is in agreement with giving that a trial. 05/01/2021 upon evaluation today patient appears to be doing about the same in regard to her wounds. She has been tolerating the dressing changes without complication. Fortunately there does not appear to be any signs of active infection at this time which is great news. No fevers, chills, nausea, vomiting, or diarrhea.. 06/05/2021 upon evaluation today patient appears to be doing pretty well currently in regard to her wound at the mastectomy site right chest wall. Overall since we switch back to the alginate she tells me things have significantly improved she is much happier with where things stand currently. Fortunately there does not appear to be any signs of active infection which is great news. No fevers, chills, nausea, vomiting, or diarrhea. 07/10/2021 upon evaluation today patient unfortunately does not appear to be doing nearly as well as what she previously was. There does not appear to be any evidence of new epithelial growth she has a lot of necrotic tissue the base of the wound this is much more significant than what we previously noted. She also has been having increased pain and increased drainage. In general I am very concerned about this especially in light of the fact that she does have a history of breast cancer I want to ensure that were not looking at any cancerous type lesion at this point. Otherwise also think she does need some debridement we did do MolecuLight scanning today. 07/17/2021 upon evaluation today patient appears to be doing well with regard to her wound compared to last week although she still having some erythema I am concerned in this regard. I do think that she fortunately had a negative biopsy which is great news  unfortunately she did have Pseudomonas noted as a bacteria present in the culture which I think is good and need to be addressed with Levaquin. I Minna send that into the pharmacy today. We did repeat the MolecuLight screening. 07/31/2021 upon evaluation today patient's wound is actually showing signs of improvement which is great news. Fortunately there does not appear to be any signs of infection currently locally nor systemically and I am very pleased in that regard. With that being said the patient is having some issues here with continued necrotic tissue I think packing with the Dakin's moistened gauze dressing is still in the right leg ago. 08/14/2021 upon evaluation today patient appears to be doing well with regard to her wound all things considered I do not see any signs of significant infection which is great news. Fortunately there does not appear to be any evidence of infection which is also great news. In general I think that the patient is tolerating the dressing changes well. We been using a Dakin's moistened gauze. Electronic Signature(s) Signed: 08/14/2021 5:54:36 PM By: Worthy Keeler PA-C Entered By: Worthy Keeler on 08/14/2021 17:54:36 -------------------------------------------------------------------------------- Physical Exam Details Patient Name: Date of Service: [name], [name] 08/14/2021 9:30 A M Medical Record Number: [id number] Patient Account Number: [id number] Date of Birth/Sex: Treating RN: [birth year] (70 y.o. [name] Primary Care Provider: [name] Other Clinician: Referring Provider: Treating Provider/Extender:  [name], [place], [name]: 70 Constitutional [name] and well-hydrated in no acute distress. Respiratory normal breathing without difficulty. Psychiatric this patient is able to make decisions and demonstrates good insight into disease process. Alert and Oriented x 3. pleasant and  cooperative. Notes Upon inspection patient's wound bed showed signs of good granulation and epithelization just in the very sparse areas for the most part she had necrotic tissue covering the majority of the wound I did perform debridement to clear away some of the necrotic debris there were a couple areas of bleeding but again I am trying not to be too aggressive with this being that I do know this tissue is radiated and again not as healthy with a lot of scarring and fibrosis I do not want to overdo this. Electronic Signature(s) Signed: 08/14/2021 5:55:05 PM By: Worthy Keeler PA-C Entered By: Worthy Keeler on 08/14/2021 17:55:05 -------------------------------------------------------------------------------- Physician Orders Details Patient Name: Date of Service: [name], [place] YE 08/14/2021 9:30 A M Medical Record Number: [id number] Patient Account Number: [id number] Date of Birth/Sex: Treating RN: [birth year] (70 y.o. [name] Primary Care Provider: [name] Other Clinician: Referring Provider: Treating Provider/Extender: [name], [name]: [phone] Verbal / Phone Orders: No Diagnosis Coding ICD-10 Coding Code Description [phone] Non-pressure chronic ulcer of skin of other sites with fat layer exposed L58.9 Radiodermatitis, unspecified L59.8 Other specified disorders of the skin and subcutaneous tissue related to radiation I10 Essential (primary) hypertension Follow-up Appointments ppointment in 2 weeks. Jeri Cos PA Return A Bathing/ Shower/ Hygiene May shower and wash wound with soap and water. - on days that dressing is changed Additional Orders / Instructions Follow Nutritious Diet Wound Treatment Wound #1 - Breast (mastectomy site) Wound Laterality: Right Cleanser: Soap and Water 1 x Per Day/30 Days Discharge Instructions: May shower and wash wound with dial antibacterial soap and water prior to dressing change. Prim  Dressing: Dakin's solution 1 x Per Day/30 Days ary Discharge Instructions: apply dakin's moistened gauze to wound bed Secondary Dressing: Woven Gauze Sponge, Non-Sterile 4x4 in (DME) (Generic) 1 x Per Day/30 Days Discharge Instructions: Apply over primary dressing as directed. Secondary Dressing: Woven Gauze Sponges 2x2 in (DME) (Generic) 1 x Per Day/30 Days Discharge Instructions: Apply over primary dressing as directed. Secondary Dressing: Bordered Gauze, 2x3.75 in (DME) (Generic) 1 x Per Day/30 Days Discharge Instructions: Apply over primary dressing as directed. Secured With: 45M Medipore H Soft Cloth Surgical Tape, 2x2 (in/yd) (Generic) 1 x Per Day/30 Days Discharge Instructions: Secure dressing with tape as directed. Electronic Signature(s) Signed: 08/14/2021 6:26:15 PM By: Worthy Keeler PA-C Signed: 08/15/2021 6:11:26 PM By: [name] RN, BSN Entered By: [name] on 08/14/2021 10:27:21 -------------------------------------------------------------------------------- Problem List Details Patient Name: Date of Service: [name], [place] YE 08/14/2021 9:30 A M Medical Record Number: [id number] Patient Account Number: [id number] Date of Birth/Sex: Treating RN: Nov 21, 1950 (70 y.o. [name] Primary Care Provider: [name] Other Clinician: Referring Provider: Treating Provider/Extender: [name], [name]: 28 Active Problems ICD-10 Encounter Code Description Active Date MDM Diagnosis [phone] Non-pressure chronic ulcer of skin of other sites with fat layer exposed 08/22/2020 No Yes L58.9 Radiodermatitis, unspecified 08/22/2020 No Yes L59.8 Other specified disorders of the skin and subcutaneous tissue related to 08/22/2020 No Yes radiation I10 Essential (primary) hypertension 08/22/2020 No Yes Inactive Problems Resolved Problems Electronic Signature(s) Signed: 08/14/2021 10:10:50 AM By: Worthy Keeler PA-C Entered By: Joaquim Lai  IIIMargarita Grizzle on 08/14/2021 10:10:50 -------------------------------------------------------------------------------- Progress Note Details Patient Name: Date of Service: [name], [name] 08/14/2021 9:30 A M Medical Record Number: [id number] Patient Account Number: [id number] Date of Birth/Sex: Treating RN: 18-Jun-1951 (70 y.o. 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 She had a mastectomy initially in 1998. She had chemotherapy only no radiation following. In 2002 she subsequently did undergo radiation for the first time and then subsequently in 2012 had repeat radiation after having had a finding that was consistent with a relapse of the cancer. 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 She also has radiation damage to the skin over the right chest wall where she had the mastectomy. She was referred to [place] by Dr. Matthew Saras in [facility] and her oncologist is Dr. Burney Gauze. Subsequently I want to make sure before we delve into this more deeply that the patient does not have any issues with a return of cancer that needs to be managed by them. 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 Nonetheless she does not remember exactly when her last PET scan was but it was not too recently she tells me. 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 The patient does  have hypertension as well. She sees her oncologist next on December 6. 09/12/2020 on evaluation today patient actually appears to be doing excellent in regard to her wound under the right breast location. Currently this is measuring smaller in general seems to be doing very well. She tells me that the collagen is doing a good job and that the dressing that she has been putting on is not causing any troubles as far pulling on her skin or scar tissue is concerned all of which is excellent news. 10/03/2020 upon evaluation today patient appears to be doing well with regard to her surgical site from her mastectomy on the right breast region. She tells me that she has had very little bleeding nothing seems to be sticking badly and in general she has been extremely pleased with where things stand today. No fevers, chills, nausea, vomiting, or diarrhea. 10/24/2020 upon evaluation today patient actually appears to be doing excellent in regard to her wound. There is just a very small area still open and to be honest there was minimal slough noted on the surface of the wound nothing that requires sharp debridement. In general I feel like she is actually doing quite well and overall I am pleased. I do think we may switch to silver alginate dressing and also contemplating using a AandE ointment in order to help keep everything moist and the scar tissue region while the alginate keeps it dry enough to heal 11/14/2020 upon evaluation today patient appears to be doing well at this point in regard to her wound. I do feel like that she is making good progress again this is a very difficult region over the surgical site of the right chest wall at the site of mastectomy.  Nonetheless I think that we are just a very small area still open I think alginate is doing a good job helping to dry this up and I would recommend this such that we continue with this. 01/02/2021 on evaluation today patient's wound actually appears to be  doing decently well. There does not appear to be any signs of active infection which is great news. She does have some slight slough buildup with biofilm on the surface of the wound mainly more biofilm. Nonetheless I was able to gently remove this with saline and gauze as well as a sterile Q-tip. She tolerated that today without complication. 01/30/2021 upon evaluation today patient appears to be doing well with regard to her wound although she still has quite a bit of trouble getting this to close I think the scar tissue and radiation damage is quite significant here unfortunately. There does not appear to be any signs of active infection which is great news. No fevers, chills, nausea, vomiting, or diarrhea. 02/27/2021 upon evaluation today patient appears to be doing excellent in regard to her wound. She has been tolerating the dressing changes without complication and in general I am extremely pleased with where things stand today. There does not appear to be any signs of infection which is great news. No fevers, chills, nausea, vomiting, or diarrhea. With that being said I do think that she still developing some slough buildup. I did discuss with her today the possibility of proceeding with HBO therapy. We have had this discussion before but she really is not quite committed to wanting to do that much and spend that much time in the chamber. She wants to still think about it. 03/27/2021 upon evaluation today patient appears to be doing about the same in regard to her wound. She still developing a lot of slough buildup on the surface of the wound. I did try to clean that away to some degree today. She tolerated that without any pain or complication. Subsequently I am thinking we may want to switch over to [facility] see if this may do better for her. Patient is in agreement with giving that a trial. 05/01/2021 upon evaluation today patient appears to be doing about the same in regard to her wounds. She has  been tolerating the dressing changes without complication. Fortunately there does not appear to be any signs of active infection at this time which is great news. No fevers, chills, nausea, vomiting, or diarrhea.. 06/05/2021 upon evaluation today patient appears to be doing pretty well currently in regard to her wound at the mastectomy site right chest wall. Overall since we switch back to the alginate she tells me things have significantly improved she is much happier with where things stand currently. Fortunately there does not appear to be any signs of active infection which is great news. No fevers, chills, nausea, vomiting, or diarrhea. 07/10/2021 upon evaluation today patient unfortunately does not appear to be doing nearly as well as what she previously was. There does not appear to be any evidence of new epithelial growth she has a lot of necrotic tissue the base of the wound this is much more significant than what we previously noted. She also has been having increased pain and increased drainage. In general I am very concerned about this especially in light of the fact that she does have a history of breast cancer I want to ensure that were not looking at any cancerous type lesion at this point. Otherwise also think she does  need some debridement we did do MolecuLight scanning today. 07/17/2021 upon evaluation today patient appears to be doing well with regard to her wound compared to last week although she still having some erythema I am concerned in this regard. I do think that she fortunately had a negative biopsy which is great news unfortunately she did have Pseudomonas noted as a bacteria present in the culture which I think is good and need to be addressed with Levaquin. I Minna send that into the pharmacy today. We did repeat the MolecuLight screening. 07/31/2021 upon evaluation today patient's wound is actually showing signs of improvement which is great news. Fortunately there does  not appear to be any signs of infection currently locally nor systemically and I am very pleased in that regard. With that being said the patient is having some issues here with continued necrotic tissue I think packing with the Dakin's moistened gauze dressing is still in the right leg ago. 08/14/2021 upon evaluation today patient appears to be doing well with regard to her wound all things considered I do not see any signs of significant infection which is great news. Fortunately there does not appear to be any evidence of infection which is also great news. In general I think that the patient is tolerating the dressing changes well. We been using a Dakin's moistened gauze. Objective Constitutional [name] and well-hydrated in no acute distress. Vitals Time Taken: 9:55 AM, Height: 63 in, Weight: 176 lbs, BMI: 31.2, Temperature: 98.4 F, Pulse: 76 bpm, Respiratory Rate: 20 breaths/min, Blood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

## 2021-08-15 ENCOUNTER — Telehealth: Payer: Self-pay

## 2021-08-15 NOTE — Telephone Encounter (Signed)
-----   Message from [name] Napoleon, MD sent at 08/13/2021  3:20 PM EDT ----- Please call her and tell her that her iron is low.  Please set her up for IV iron.  Thanks.  Laurey Arrow

## 2021-08-15 NOTE — Telephone Encounter (Signed)
Called and informed patient of lab results, patient verbalized understanding and denies any questions or concerns at this time.    Patient understands scheduling will call once iron is approved through her insurance. She verbalized and denies any questions
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
## 2021-08-20 ENCOUNTER — Telehealth: Payer: Self-pay | Admitting: *Deleted

## 2021-08-20 NOTE — Telephone Encounter (Signed)
Per staff message Tiffany - called and gave upcoming appointment - confirmed - (1) dose of monoferric IV Iron

## 2021-08-22 ENCOUNTER — Other Ambulatory Visit: Payer: Self-pay

## 2021-08-22 ENCOUNTER — Inpatient Hospital Stay: Payer: Medicare Other

## 2021-08-22 VITALS — BP 147/53 | HR 76 | Temp 98.9°F | Resp 18

## 2021-08-22 DIAGNOSIS — Z7982 Long term (current) use of aspirin: Secondary | ICD-10-CM | POA: Diagnosis not present

## 2021-08-22 DIAGNOSIS — D638 Anemia in other chronic diseases classified elsewhere: Secondary | ICD-10-CM | POA: Diagnosis not present

## 2021-08-22 DIAGNOSIS — D508 Other iron deficiency anemias: Secondary | ICD-10-CM

## 2021-08-22 DIAGNOSIS — Z79811 Long term (current) use of aromatase inhibitors: Secondary | ICD-10-CM | POA: Diagnosis not present

## 2021-08-22 DIAGNOSIS — M25461 Effusion, right knee: Secondary | ICD-10-CM | POA: Diagnosis not present

## 2021-08-22 DIAGNOSIS — D509 Iron deficiency anemia, unspecified: Secondary | ICD-10-CM | POA: Diagnosis not present

## 2021-08-22 DIAGNOSIS — C50911 Malignant neoplasm of unspecified site of right female breast: Secondary | ICD-10-CM | POA: Diagnosis not present

## 2021-08-22 MED ORDER — SODIUM CHLORIDE 0.9 % IV SOLN
510.0000 mg | Freq: Once | INTRAVENOUS | Status: AC
  Administered 2021-08-22: 510 mg via INTRAVENOUS
  Filled 2021-08-22: qty 17

## 2021-08-22 MED ORDER — SODIUM CHLORIDE 0.9 % IV SOLN: INTRAVENOUS | Status: DC

## 2021-08-22 NOTE — Patient Instructions (Signed)

## 2021-08-26 ENCOUNTER — Other Ambulatory Visit: Payer: Self-pay | Admitting: Hematology & Oncology

## 2021-08-26 DIAGNOSIS — C50011 Malignant neoplasm of nipple and areola, right female breast: Secondary | ICD-10-CM

## 2021-08-27 DIAGNOSIS — Z789 Other specified health status: Secondary | ICD-10-CM | POA: Diagnosis not present

## 2021-08-27 DIAGNOSIS — L299 Pruritus, unspecified: Secondary | ICD-10-CM | POA: Diagnosis not present

## 2021-08-27 DIAGNOSIS — S00419D Abrasion of unspecified ear, subsequent encounter: Secondary | ICD-10-CM | POA: Diagnosis not present

## 2021-08-28 ENCOUNTER — Encounter (HOSPITAL_BASED_OUTPATIENT_CLINIC_OR_DEPARTMENT_OTHER): Payer: Medicare Other | Admitting: Physician Assistant

## 2021-08-28 ENCOUNTER — Other Ambulatory Visit: Payer: Self-pay

## 2021-08-28 DIAGNOSIS — L598 Other specified disorders of the skin and subcutaneous tissue related to radiation: Secondary | ICD-10-CM | POA: Diagnosis not present

## 2021-08-28 DIAGNOSIS — L98492 Non-pressure chronic ulcer of skin of other sites with fat layer exposed: Secondary | ICD-10-CM | POA: Diagnosis not present

## 2021-08-28 DIAGNOSIS — L589 Radiodermatitis, unspecified: Secondary | ICD-10-CM | POA: Diagnosis not present

## 2021-08-28 DIAGNOSIS — Z9221 Personal history of antineoplastic chemotherapy: Secondary | ICD-10-CM | POA: Diagnosis not present

## 2021-08-28 DIAGNOSIS — I10 Essential (primary) hypertension: Secondary | ICD-10-CM | POA: Diagnosis not present

## 2021-08-28 DIAGNOSIS — Z86718 Personal history of other venous thrombosis and embolism: Secondary | ICD-10-CM | POA: Diagnosis not present

## 2021-08-28 NOTE — Progress Notes (Addendum)
[name], [name] ([id number]) Visit Report for 08/28/2021 Chief Complaint Document Details Patient Name: Date of Service: [name] YE 08/28/2021 9:30 A M Medical Record Number: [id number] Patient Account Number: [id number] Date of Birth/Sex: Treating RN: 14-Feb-1951 (70 y.o. 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 [name], [name] Primary Care Provider: Maryella [name] Other Clinician: Referring Provider: Treating Provider/Extender: [name], [name] in Treatment: 53 Debridement Performed for Assessment: Wound #1 Right Breast (mastectomy site) Performed By: Physician Worthy Keeler, PA Debridement Type: Debridement Level of Consciousness (Pre-procedure): Awake and Alert Pre-procedure Verification/Time Out Yes - 11:15 Taken: Start Time: 11:15 Pain Control: Lidocaine 4% T opical Solution T Area Debrided (L x W): otal 1.7 (cm) x 3.1 (cm) = 5.27 (cm) Tissue and other material debrided: Viable, Non-Viable, Slough, Subcutaneous, Slough Level: Skin/Subcutaneous Tissue Debridement Description: Excisional Instrument: Curette Bleeding: Minimum Hemostasis Achieved: Pressure Procedural Pain: 2 Post Procedural Pain: 0 Response to Treatment: Procedure was tolerated well Level of Consciousness (Post- Awake  and Alert procedure): Post Debridement Measurements of Total Wound Length: (cm) 1.7 Width: (cm) 3.1 Depth: (cm) 0.7 Volume: (cm) 2.897 Character of Wound/Ulcer Post Debridement: Requires Further Debridement Post Procedure Diagnosis Same as Pre-procedure Electronic Signature(s) Signed: 08/28/2021 4:59:49 PM By: Worthy Keeler PA-C Signed: 08/28/2021 5:55:37 PM By: Baruch Gouty RN, BSN Entered By: Baruch Gouty on 08/28/2021 10:49:58 -------------------------------------------------------------------------------- HPI Details Patient Name: Date of Service: [name], [name] 08/28/2021 9:30 A M Medical Record Number: [id number] Patient Account Number: [id number] Date of Birth/Sex: Treating RN: August 14, 1951 (70 y.o. [name] Primary Care Provider: Maryella [name] Other Clinician: Referring Provider: Treating Provider/Extender: [name], [name]: 78 History of Present Illness HPI Description: 08/22/2020 upon evaluation today patient actually appears to be doing somewhat poorly in regard to her mastectomy site on the right chest wall. She had a mastectomy initially in 1998. She had chemotherapy only no radiation following. In 2002 she subsequently did undergo radiation for the first time and then subsequently in 2012 had repeat radiation after having had a finding that was consistent with a relapse of the cancer. 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 She also has radiation damage to the skin over the right chest wall where she had the mastectomy. She was referred to [place] by Dr. Matthew Saras in [facility] and her oncologist is Dr. Burney Gauze. Subsequently I want to make sure before we delve into this more deeply that the patient does not have any issues with a return of cancer that needs to be managed by them. Obviously she does have significant scar tissue which may be benefited secondary  to the soft tissue radionecrosis by hyperbaric oxygen therapy in the absence of any recurrence of the cancer. Nonetheless she does not remember exactly when her last PET scan was but it was not too recently she tells me. 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 The patient does have hypertension  as well. She sees her oncologist next on December 6. 09/12/2020 on evaluation today patient actually appears to be doing excellent in regard to her wound under the right breast location. Currently this is measuring smaller in general seems to be doing very well. She tells me that the collagen is doing a good job and that the dressing that she has been putting on is not causing any troubles as far pulling on her skin or scar tissue is concerned all of which is excellent news. 10/03/2020 upon evaluation today patient appears to be doing well with regard to her surgical site from her mastectomy on the right breast region. She tells me that she has had very little bleeding nothing seems to be sticking badly and in general she has been extremely pleased with where things stand today. No fevers, chills, nausea, vomiting, or diarrhea. 10/24/2020 upon evaluation today patient actually appears to be doing excellent in regard to her wound. There is just a very small area still open and to be honest there was minimal slough noted on the surface of the wound nothing that requires sharp debridement. In general I feel like she is actually doing quite well and overall I am pleased. I do think we may switch to silver alginate dressing and also contemplating using a AandE ointment in order to help keep everything moist and the scar tissue region while the alginate keeps it dry enough to heal 11/14/2020 upon evaluation today patient appears to be doing well at this point in regard to her wound. I do feel like that she is making good progress again this is a very difficult region over the surgical site of the right  chest wall at the site of mastectomy. Nonetheless I think that we are just a very small area still open I think alginate is doing a good job helping to dry this up and I would recommend this such that we continue with this. 01/02/2021 on evaluation today patient's wound actually appears to be doing decently well. There does not appear to be any signs of active infection which is great news. She does have some slight slough buildup with biofilm on the surface of the wound mainly more biofilm. Nonetheless I was able to gently remove this with saline and gauze as well as a sterile Q-tip. She tolerated that today without complication. 01/30/2021 upon evaluation today patient appears to be doing well with regard to her wound although she still has quite a bit of trouble getting this to close I think the scar tissue and radiation damage is quite significant here unfortunately. There does not appear to be any signs of active infection which is great news. No fevers, chills, nausea, vomiting, or diarrhea. 02/27/2021 upon evaluation today patient appears to be doing excellent in regard to her wound. She has been tolerating the dressing changes without complication and in general I am extremely pleased with where things stand today. There does not appear to be any signs of infection which is great news. No fevers, chills, nausea, vomiting, or diarrhea. With that being said I do think that she still developing some slough buildup. I did discuss with her today the possibility of proceeding with HBO therapy. We have had this discussion before but she really is not quite committed to wanting to do that much and spend that much time in the chamber. She wants to still think about it. 03/27/2021 upon evaluation today patient appears to be doing about the same in regard to her  wound. She still developing a lot of slough buildup on the surface of the wound. I did try to clean that away to some degree today. She tolerated  that without any pain or complication. Subsequently I am thinking we may want to switch over to [facility] see if this may do better for her. Patient is in agreement with giving that a trial. 05/01/2021 upon evaluation today patient appears to be doing about the same in regard to her wounds. She has been tolerating the dressing changes without complication. Fortunately there does not appear to be any signs of active infection at this time which is great news. No fevers, chills, nausea, vomiting, or diarrhea.. 06/05/2021 upon evaluation today patient appears to be doing pretty well currently in regard to her wound at the mastectomy site right chest wall. Overall since we switch back to the alginate she tells me things have significantly improved she is much happier with where things stand currently. Fortunately there does not appear to be any signs of active infection which is great news. No fevers, chills, nausea, vomiting, or diarrhea. 07/10/2021 upon evaluation today patient unfortunately does not appear to be doing nearly as well as what she previously was. There does not appear to be any evidence of new epithelial growth she has a lot of necrotic tissue the base of the wound this is much more significant than what we previously noted. She also has been having increased pain and increased drainage. In general I am very concerned about this especially in light of the fact that she does have a history of breast cancer I want to ensure that were not looking at any cancerous type lesion at this point. Otherwise also think she does need some debridement we did do MolecuLight scanning today. 07/17/2021 upon evaluation today patient appears to be doing well with regard to her wound compared to last week although she still having some erythema I am concerned in this regard. I do think that she fortunately had a negative biopsy which is great news unfortunately she did have Pseudomonas noted as a bacteria present  in the culture which I think is good and need to be addressed with Levaquin. I Minna send that into the pharmacy today. We did repeat the MolecuLight screening. 07/31/2021 upon evaluation today patient's wound is actually showing signs of improvement which is great news. Fortunately there does not appear to be any signs of infection currently locally nor systemically and I am very pleased in that regard. With that being said the patient is having some issues here with continued necrotic tissue I think packing with the Dakin's moistened gauze dressing is still in the right leg ago. 08/14/2021 upon evaluation today patient appears to be doing well with regard to her wound all things considered I do not see any signs of significant infection which is great news. Fortunately there does not appear to be any evidence of infection which is also great news. In general I think that the patient is tolerating the dressing changes well. We been using a Dakin's moistened gauze. 08/28/2021 upon evaluation today patient appears to be doing well with regard to her wound. Worsening signs of definite improvement which is great news and overall very pleased with where we stand today. There is no signs of inflamed tissue or infection which is great as well and I think the Dakin's is doing a great job here. Electronic Signature(s) Signed: 08/28/2021 10:52:16 AM By: Worthy Keeler PA-C Entered By: Worthy Keeler  on 08/28/2021 10:52:15 -------------------------------------------------------------------------------- Physical Exam Details Patient Name: Date of Service: [name], [name] 08/28/2021 9:30 A M Medical Record Number: [id number] Patient Account Number: [id number] Date of Birth/Sex: Treating RN: 1951/04/17 (70 y.o. [name] Primary Care Provider: Maryella [name] Other Clinician: Referring Provider: Treating Provider/Extender: [name], [name]:  24 Constitutional [name] and well-hydrated in no acute distress. Respiratory normal breathing without difficulty. Psychiatric this patient is able to make decisions and demonstrates good insight into disease process. Alert and Oriented x 3. pleasant and cooperative. Notes Patient's wound bed showed signs of good granulation epithelization at this point. Fortunately there does not appear to be any evidence of infection and overall I think that were definitely headed in the appropriate direction. No fevers, chills, nausea, vomiting, or diarrhea. Electronic Signature(s) Signed: 08/28/2021 10:52:47 AM By: Worthy Keeler PA-C Entered By: Worthy Keeler on 08/28/2021 10:52:46 -------------------------------------------------------------------------------- Physician Orders Details Patient Name: Date of Service: [name], [name] 08/28/2021 9:30 A M Medical Record Number: [id number] Patient Account Number: [id number] Date of Birth/Sex: Treating RN: [birth year] (70 y.o. [name] Primary Care Provider: Maryella [name] Other Clinician: Referring Provider: Treating Provider/Extender: [name], [name]: 1 Verbal / Phone Orders: No Diagnosis Coding ICD-10 Coding Code Description [phone] Non-pressure chronic ulcer of skin of other sites with fat layer exposed L58.9 Radiodermatitis, unspecified L59.8 Other specified disorders of the skin and subcutaneous tissue related to radiation I10 Essential (primary) hypertension Follow-up Appointments ppointment in 2 weeks. Jeri Cos PA Return A Bathing/ Shower/ Hygiene May shower and wash wound with soap and water. - on days that dressing is changed Additional Orders / Instructions Follow Nutritious Diet Wound Treatment Wound #1 - Breast (mastectomy site) Wound Laterality: Right Cleanser: Soap and Water 1 x Per Day/30 Days Discharge Instructions: May shower and wash wound with dial antibacterial  soap and water prior to dressing change. Prim Dressing: Dakin's solution 1 x Per Day/30 Days ary Discharge Instructions: apply dakin's moistened gauze to wound bed Secondary Dressing: Woven Gauze Sponge, Non-Sterile 4x4 in (Generic) 1 x Per Day/30 Days Discharge Instructions: Apply over primary dressing as directed. Secondary Dressing: Woven Gauze Sponges 2x2 in (Generic) 1 x Per Day/30 Days Discharge Instructions: Apply over primary dressing as directed. Secondary Dressing: Bordered Gauze, 2x3.75 in (Generic) 1 x Per Day/30 Days Discharge Instructions: Apply over primary dressing as directed. Secured With: 84M Medipore H Soft Cloth Surgical Tape, 2x2 (in/yd) (Generic) 1 x Per Day/30 Days Discharge Instructions: Secure dressing with tape as directed. Electronic Signature(s) Signed: 08/28/2021 4:59:49 PM By: Worthy Keeler PA-C Signed: 08/28/2021 5:55:37 PM By: Baruch Gouty RN, BSN Entered By: Baruch Gouty on 08/28/2021 10:50:54 -------------------------------------------------------------------------------- Problem List Details Patient Name: Date of Service: [name], [place] YE 08/28/2021 9:30 A M Medical Record Number: [id number] Patient Account Number: [id number] Date of Birth/Sex: Treating RN: 08/21/1951 (70 y.o. [name] Primary Care Provider: Maryella [name] Other Clinician: Referring Provider: Treating Provider/Extender: [name], [name]: 61 Active Problems ICD-10 Encounter Code Description Active Date MDM Diagnosis [phone] Non-pressure chronic ulcer of skin of other sites with fat layer exposed 08/22/2020 No Yes L58.9 Radiodermatitis, unspecified 08/22/2020 No Yes L59.8 Other specified disorders of the skin and subcutaneous tissue related to 08/22/2020 No Yes radiation I10 Essential (primary) hypertension 08/22/2020 No Yes Inactive Problems Resolved Problems Electronic Signature(s) Signed: 08/28/2021 9:29:53 AM By: Worthy Keeler PA-C Entered By: Worthy Keeler  on 08/28/2021 09:29:52 -------------------------------------------------------------------------------- Progress Note Details Patient Name: Date of Service: [name], [place] YE 08/28/2021 9:30 A M Medical Record Number: [id number] Patient Account Number: [id number] Date of Birth/Sex: Treating RN: 08-19-1951 (70 y.o. 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 She had a mastectomy initially in 1998. She had chemotherapy only no radiation following. In 2002 she subsequently did undergo radiation for the first time and then subsequently in 2012 had repeat radiation after having had a finding that was consistent with a relapse of the cancer. 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 She also has radiation damage to the skin over the right chest wall where she had the mastectomy. She was referred to [place] by Dr. Matthew Saras in [facility] and her oncologist is Dr. Burney Gauze. Subsequently I want to make sure before we delve into this more deeply that the patient does not have any issues with a return of cancer that needs to be managed by them. 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 Nonetheless she does not remember exactly when her last PET scan was but it was not too recently she tells me. She does have a history of a DVT in  the right leg in 2013. The patient does have hypertension as well. She sees her oncologist next on December 6. 09/12/2020 on evaluation today patient actually appears to be doing excellent in regard to her wound under the right breast location. Currently this is measuring smaller in general seems to be doing very well. She tells me that the collagen is doing a good job and that the dressing that she has been putting on is not causing any troubles as far pulling on her skin or scar tissue is concerned all of which is excellent news. 10/03/2020 upon evaluation today patient appears to be doing well with regard to her surgical site from her mastectomy on the right breast region. She tells me that she has had very little bleeding nothing seems to be sticking badly and in general she has been extremely pleased with where things stand today. No fevers, chills, nausea, vomiting, or diarrhea. 10/24/2020 upon evaluation today patient actually appears to be doing excellent in regard to her wound. There is just a very small area still open and to be honest there was minimal slough noted on the surface of the wound nothing that requires sharp debridement. In general I feel like she is actually doing quite well and overall I am pleased. I do think we may switch to silver alginate dressing and also contemplating using a AandE ointment in order to help keep everything moist and the scar tissue region while the alginate keeps it dry enough to heal 11/14/2020 upon evaluation today patient appears to be doing well at this point in regard to her wound. I do feel like that she is making good progress again this is a very difficult region over the surgical site of the right chest wall at the site of mastectomy. Nonetheless I  think that we are just a very small area still open I think alginate is doing a good job helping to dry this up and I would recommend this such that we continue with this. 01/02/2021 on evaluation today  patient's wound actually appears to be doing decently well. There does not appear to be any signs of active infection which is great news. She does have some slight slough buildup with biofilm on the surface of the wound mainly more biofilm. Nonetheless I was able to gently remove this with saline and gauze as well as a sterile Q-tip. She tolerated that today without complication. 01/30/2021 upon evaluation today patient appears to be doing well with regard to her wound although she still has quite a bit of trouble getting this to close I think the scar tissue and radiation damage is quite significant here unfortunately. There does not appear to be any signs of active infection which is great news. No fevers, chills, nausea, vomiting, or diarrhea. 02/27/2021 upon evaluation today patient appears to be doing excellent in regard to her wound. She has been tolerating the dressing changes without complication and in general I am extremely pleased with where things stand today. There does not appear to be any signs of infection which is great news. No fevers, chills, nausea, vomiting, or diarrhea. With that being said I do think that she still developing some slough buildup. I did discuss with her today the possibility of proceeding with HBO therapy. We have had this discussion before but she really is not quite committed to wanting to do that much and spend that much time in the chamber. She wants to still think about it. 03/27/2021 upon evaluation today patient appears to be doing about the same in regard to her wound. She still developing a lot of slough buildup on the surface of the wound. I did try to clean that away to some degree today. She tolerated that without any pain or complication. Subsequently I am thinking we may want to switch over to [facility] see if this may do better for her. Patient is in agreement with giving that a trial. 05/01/2021 upon evaluation today patient appears to be doing about the  same in regard to her wounds. She has been tolerating the dressing changes without complication. Fortunately there does not appear to be any signs of active infection at this time which is great news. No fevers, chills, nausea, vomiting, or diarrhea.. 06/05/2021 upon evaluation today patient appears to be doing pretty well currently in regard to her wound at the mastectomy site right chest wall. Overall since we switch back to the alginate she tells me things have significantly improved she is much happier with where things stand currently. Fortunately there does not appear to be any signs of active infection which is great news. No fevers, chills, nausea, vomiting, or diarrhea. 07/10/2021 upon evaluation today patient unfortunately does not appear to be doing nearly as well as what she previously was. There does not appear to be any evidence of new epithelial growth she has a lot of necrotic tissue the base of the wound this is much more significant than what we previously noted. She also has been having increased pain and increased drainage. In general I am very concerned about this especially in light of the fact that she does have a history of breast cancer I want to ensure that were not looking at any cancerous type lesion at this point. Otherwise also think she does need some  debridement we did do MolecuLight scanning today. 07/17/2021 upon evaluation today patient appears to be doing well with regard to her wound compared to last week although she still having some erythema I am concerned in this regard. I do think that she fortunately had a negative biopsy which is great news unfortunately she did have Pseudomonas noted as a bacteria present in the culture which I think is good and need to be addressed with Levaquin. I Minna send that into the pharmacy today. We did repeat the MolecuLight screening. 07/31/2021 upon evaluation today patient's wound is actually showing signs of improvement which is  great news. Fortunately there does not appear to be any signs of infection currently locally nor systemically and I am very pleased in that regard. With that being said the patient is having some issues here with continued necrotic tissue I think packing with the Dakin's moistened gauze dressing is still in the right leg ago. 08/14/2021 upon evaluation today patient appears to be doing well with regard to her wound all things considered I do not see any signs of significant infection which is great news. Fortunately there does not appear to be any evidence of infection which is also great news. In general I think that the patient is tolerating the dressing changes well. We been using a Dakin's moistened gauze. 08/28/2021 upon evaluation today patient appears to be doing well with regard to her wound. Worsening signs of definite improvement which is great news and overall very pleased with where we stand today. There is no signs of inflamed tissue or infection which is great as well and I think the Dakin's is doing a great job here. Objective Constitutional [name] and well-hydrated in no acute distress. Vitals Time Taken: 10:02 AM, Height: 63 in, Source: Stated, Weight: 176 lbs, Source: Stated, BMI: 31.2, Temperature: 97.9 F, Pulse: 76 bpm, Respiratory Rate: 18 breaths/min, Blood Pressure: 158/73 mmHg. Respiratory normal breathing without difficulty. Psychiatric this patient is able to make decisions and demonstrates good insight into disease process. Alert and Oriented x 3. pleasant and cooperative. General Notes: Patient's wound bed showed signs of good granulation epithelization at this point. Fortunately there does not appear to be any evidence of infection and overall I think that were definitely headed in the appropriate direction. No fevers, chills, nausea, vomiting, or diarrhea. Integumentary (Hair, Skin) Wound #1 status is Open. Original cause of wound was Radiation Burn. The  date acquired was: 07/26/2020. The wound has been in treatment 53 weeks. The wound is located on the Right Breast (mastectomy site). The wound measures 1.7cm length x 3.1cm width x 0.7cm depth; 4.139cm^2 area and 2.897cm^3 volume. There is Fat Layer (Subcutaneous Tissue) exposed. There is no tunneling or undermining noted. There is a medium amount of serous drainage noted. The wound margin is distinct with the outline attached to the wound base. There is small (1-33%) pink granulation within the wound bed. There is a large (67- 100%) amount of necrotic tissue within the wound bed including Adherent Slough. 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 There was a Excisional Skin/Subcutaneous Tissue Debridement with a total area of 5.27 sq cm performed by Worthy Keeler, PA. With the following instrument(s): Curette to remove Viable  and Non-Viable tissue/material. Material removed includes Subcutaneous Tissue and Slough and after achieving pain control using Lidocaine 4% T opical Solution. No specimens were taken. A time out was conducted at 11:15, prior to the start of the procedure. A Minimum amount of bleeding was controlled with Pressure. The procedure was tolerated well with a pain level of 2 throughout and a pain level of 0 following the procedure. Post Debridement Measurements: 1.7cm length x 3.1cm width x 0.7cm depth; 2.897cm^3 volume. Character of Wound/Ulcer Post Debridement requires further debridement. Post procedure Diagnosis Wound #1: Same as Pre-Procedure Plan Follow-up Appointments: Return Appointment in 2 weeks. Jeri Cos PA Bathing/  Shower/ Hygiene: May shower and wash wound with soap and water. - on days that dressing is changed Additional Orders / Instructions: Follow Nutritious Diet WOUND #1: - Breast (mastectomy site) Wound Laterality: Right Cleanser: Soap and Water 1 x Per Day/30 Days Discharge Instructions: May shower and wash wound with dial antibacterial soap and water prior to dressing change. Prim Dressing: Dakin's solution 1 x Per Day/30 Days ary Discharge Instructions: apply dakin's moistened gauze to wound bed Secondary Dressing: Woven Gauze Sponge, Non-Sterile 4x4 in (Generic) 1 x Per Day/30 Days Discharge Instructions: Apply over primary dressing as directed. Secondary Dressing: Woven Gauze Sponges 2x2 in (Generic) 1 x Per Day/30 Days Discharge Instructions: Apply over primary dressing as directed. Secondary Dressing: Bordered Gauze, 2x3.75 in (Generic) 1 x Per Day/30 Days Discharge Instructions: Apply over primary dressing as directed. Secured With: 12M Medipore H Soft Cloth Surgical T ape, 2x2 (in/yd) (Generic) 1 x Per Day/30 Days Discharge Instructions: Secure dressing with tape as directed. 1. Would recommend that we going to continue with the wound care measures as before and the patient is in agreement with the plan. Fortunately there does not appear to be any signs of infection systemically which is great news and overall I think that she is doing great locally as well. I think the Dakin's moistened gauze is still the best way to go. 2. Also can recommend that we have the patient continue with the border gauze dressing to cover which is doing awesome for her. We will see patient back for reevaluation in 2 weeks here in the clinic. If anything worsens or changes patient will contact our office for additional recommendations. Electronic Signature(s) Signed: 08/28/2021 10:53:18 AM By: Worthy Keeler PA-C Entered By: Worthy Keeler on 08/28/2021  10:53:17 -------------------------------------------------------------------------------- SuperBill Details Patient Name: Date of Service: [name], [place] YE 08/28/2021 Medical Record Number: [id number] Patient Account Number: [id number] Date of Birth/Sex: Treating RN: 1951-02-09 (70 y.o. [name] Primary Care Provider: Maryella [name] Other Clinician: Referring Provider: Treating Provider/Extender: [name], [name]: 53 Diagnosis Coding ICD-10 Codes Code Description [phone] Non-pressure chronic ulcer of skin of other sites with fat layer exposed L58.9 Radiodermatitis, unspecified L59.8 Other specified disorders of the skin and subcutaneous tissue related to radiation I10 Essential (primary) hypertension Facility Procedures The patient participates with Medicare or their insurance follows the Medicare Facility Guidelines: CPT4 Code Description Modifier Quantity [phone] 11042 - DEB SUBQ TISSUE 20 SQ CM/< 1 ICD-10 Diagnosis Description L98.492 Non-pressure chronic ulcer of  skin of other sites with fat layer exposed Physician Procedures : CPT4 Code Description Modifier [id number] 11042 - WC PHYS SUBQ TISS 20 SQ CM ICD-10 Diagnosis Description L98.492 Non-pressure chronic ulcer of skin of other sites with fat layer exposed Quantity: 1 Electronic Signature(s) Signed: 08/28/2021 10:53:33 AM By: Worthy Keeler PA-C Entered  By: Worthy Keeler on 08/28/2021 10:53:32

## 2021-08-28 NOTE — Progress Notes (Signed)
[name], [name] ([id number]) Visit Report for 08/28/2021 Arrival Information Details Patient Name: Date of Service: [name] 08/28/2021 9:30 A M Medical Record Number: [id number] Patient Account Number: [id number] Date of Birth/Sex: Treating RN: [name] (70 y.o. [name], [name] Primary Care Diora Bellizzi: [name] Other Clinician: Referring Mickayla Trouten: Treating Romel Dumond/Extender: [name], [name] in Treatment: 81 Visit Information History Since Last Visit Added or deleted any medications: No Patient Arrived: Ambulatory Any new allergies or adverse reactions: No Arrival Time: 10:00 Had a fall or experienced change in No Accompanied By: self activities of daily living that may affect Transfer Assistance: None risk of falls: Patient Identification Verified: Yes Signs or symptoms of abuse/neglect since last visito No Secondary Verification Process Completed: Yes Hospitalized since last visit: No Patient Requires Transmission-Based Precautions: No Implantable device outside of the clinic excluding No Patient Has Alerts: No cellular tissue based products placed in the center since last visit: Has Dressing in Place as Prescribed: Yes Pain Present Now: No Electronic Signature(s) Signed: 08/28/2021 5:55:37 PM By: Baruch Gouty RN, BSN Entered By: Baruch Gouty on 08/28/2021 10:[name]:34 -------------------------------------------------------------------------------- Encounter Discharge Information Details Patient Name: Date of Service: [name] INS, [place] [name] 08/28/2021 9:30 A M Medical Record Number: [id number] Patient Account Number: [id number] Date of Birth/Sex: Treating RN: [name]/[birth year]/[name] (70 y.o. [name] Primary Care Aimar Borghi: [name] Other Clinician: Referring Tanise Russman: Treating Samiya Mervin/Extender: [name], [name]: 67 Encounter Discharge Information Items Post Procedure Vitals Discharge Condition:  Stable Temperature (F): 97.9 Ambulatory Status: Ambulatory Pulse (bpm): 76 Discharge Destination: Home Respiratory Rate (breaths/min): 18 Transportation: Private Auto Blood Pressure (mmHg): 158/73 Accompanied By: self Schedule Follow-up Appointment: Yes Clinical Summary of Care: Patient Declined Electronic Signature(s) Signed: 08/28/2021 5:55:37 PM By: Baruch Gouty RN, BSN Entered By: Baruch Gouty on 08/28/2021 10:56:24 -------------------------------------------------------------------------------- Lower Extremity Assessment Details Patient Name: Date of Service: [name] INS, [name] 08/28/2021 9:30 A M Medical Record Number: [id number] Patient Account Number: [id number] Date of Birth/Sex: Treating RN: [name] (70 y.o. [name] Primary Care Vaishnav Demartin: [name] Other Clinician: Referring Hawraa Stambaugh: Treating Zandra Lajeunesse/Extender: [name], [name]: [phone] Electronic Signature(s) Signed: 08/28/2021 5:55:37 PM By: Baruch Gouty RN, BSN Entered By: Baruch Gouty on 08/28/2021 10:04:19 -------------------------------------------------------------------------------- Multi-Disciplinary Care Plan Details Patient Name: Date of Service: [name] INS, [place] [name] 08/28/2021 9:30 A M Medical Record Number: [id number] Patient Account Number: [id number] Date of Birth/Sex: Treating RN: [name], [name] (70 y.o. [name] Primary Care Rylen Swindler: [name] Other Clinician: Referring Michel Hendon: Treating Mozel Burdett/Extender: [name], [name] in Treatment: 66 [place] reviewed with physician Active Inactive Necrotic Tissue Nursing Diagnoses: Knowledge deficit related to management of necrotic/devitalized tissue Goals: Necrotic/devitalized tissue will be minimized in the wound bed Date Initiated: 07/10/2021 Target Resolution Date: 10/04/2021 Goal Status: Active Interventions: Provide education on  necrotic tissue and debridement process Treatment Activities: Excisional debridement : 07/10/2021 Notes: Soft Tissue Infection Nursing Diagnoses: Impaired tissue integrity Knowledge deficit related to disease process and management Goals: Patient's soft tissue infection will resolve Date Initiated: 07/10/2021 Target Resolution Date: 09/06/2021 Goal Status: Active Interventions: Assess signs and symptoms of infection every visit Provide education on infection Treatment Activities: Culture : 07/10/2021 Education provided on Infection : 07/17/2021 Systemic antibiotics : 07/10/2021 T ordered outside of clinic : 07/10/2021 est Notes: Wound/Skin Impairment Nursing Diagnoses: Impaired tissue integrity Knowledge deficit related to ulceration/compromised skin integrity Goals: Patient/caregiver will verbalize understanding of skin care regimen Date Initiated: 08/22/2020 Target Resolution  Date: 10/04/2021 Goal Status: Active Ulcer/skin breakdown will have a volume reduction of 30% by week 4 Date Initiated: 08/22/2020 Date Inactivated: 10/03/2020 Target Resolution Date: 09/19/2020 Goal Status: Met Ulcer/skin breakdown will have a volume reduction of 50% by week 8 Date Initiated: 06/05/2021 Date Inactivated: 07/17/2021 Target Resolution Date: 07/03/2021 Goal Status: Unmet Unmet Reason: infection Interventions: Assess patient/caregiver ability to obtain necessary supplies Assess patient/caregiver ability to perform ulcer/skin care regimen upon admission and as needed Assess ulceration(s) every visit Treatment Activities: Skin care regimen initiated : 08/22/2020 Topical wound management initiated : 08/22/2020 Notes: 06/05/21: Wound care regimen ongoing. Electronic Signature(s) Signed: 08/28/2021 5:55:37 PM By: Baruch Gouty RN, BSN Entered By: Baruch Gouty on 08/28/2021 10:12:41 -------------------------------------------------------------------------------- Pain Assessment  Details Patient Name: Date of Service: [name] INS, [name] 08/28/2021 9:30 A M Medical Record Number: [id number] Patient Account Number: [id number] Date of Birth/Sex: Treating RN: [birth year] [name] (70 y.o. [name] Primary Care Daylan Boggess: [name] Other Clinician: Referring Jahvier Aldea: Treating Kalifa Cadden/Extender: [name], [name]: 74 Active Problems Location of Pain Severity and Description of Pain Patient Has Paino No Site Locations Rate the pain. Rate the pain. Current Pain Level: 0 Pain Management and Medication Current Pain Management: Electronic Signature(s) Signed: 08/28/2021 5:55:37 PM By: Baruch Gouty RN, BSN Entered By: Baruch Gouty on 08/28/2021 10:03:31 -------------------------------------------------------------------------------- Patient/Caregiver Education Details Patient Name: Date of Service: [name], [name] 11/16/2022andnbsp9:30 A M Medical Record Number: [id number] Patient Account Number: [id number] Date of Birth/Gender: Treating RN: [birth year] (70 y.o. [name] Primary Care Physician: [name] Other Clinician: Referring Physician: Treating Physician/Extender: [name], [name] in Treatment: 72 Education Assessment Education Provided To: Patient Education Topics Provided Wound Debridement: Methods: Explain/Verbal Responses: Reinforcements needed, State content correctly Electronic Signature(s) Signed: 08/28/2021 5:55:37 PM By: Baruch Gouty RN, BSN Entered By: Baruch Gouty on 08/28/2021 10:13:04 -------------------------------------------------------------------------------- Wound Assessment Details Patient Name: Date of Service: [name] INS, [place] [name] 08/28/2021 9:30 A M Medical Record Number: [id number] Patient Account Number: [id number] Date of Birth/Sex: Treating RN: [birth year], [name] (70 y.o. [name], [name] Primary Care Sharrieff Spratlin: [name] Other  Clinician: Referring Latishia Suitt: Treating Lucas Exline/Extender: [name], [name]: 53 Wound Status Wound Number: 1 Primary Soft Tissue Radionecrosis Etiology: Wound Location: Right Breast (mastectomy site) Wound Open Wounding Event: Radiation Burn Status: Date Acquired: 07/26/2020 Comorbid Anemia, Lymphedema, Deep Vein Thrombosis, Hypertension, Weeks Of Treatment: 53 History: Peripheral Venous Disease, Osteoarthritis, Received Clustered Wound: No Chemotherapy, Received Radiation, Confinement Anxiety Photos Wound Measurements Length: (cm) 1.7 Width: (cm) 3.1 Depth: (cm) 0.7 Area: (cm) 4.139 Volume: (cm) 2.897 % Reduction in Area: -170.2% % Reduction in Volume: -1793.5% Epithelialization: Small (1-33%) Tunneling: No Undermining: No Wound Description Classification: Full Thickness Without Exposed Support Structures Wound Margin: Distinct, outline attached Exudate Amount: Medium Exudate Type: Serous Exudate Color: amber Foul Odor After Cleansing: No Slough/Fibrino Yes Wound Bed Granulation Amount: Small (1-33%) Exposed Structure Granulation Quality: Pink Fascia Exposed: No Necrotic Amount: Large (67-100%) Fat Layer (Subcutaneous Tissue) Exposed: Yes Necrotic Quality: Adherent Slough Tendon Exposed: No Muscle Exposed: No Joint Exposed: No Bone Exposed: No Treatment Notes Wound #1 (Breast (mastectomy site)) Wound Laterality: Right Cleanser Soap and Water Discharge Instruction: May shower and wash wound with dial antibacterial soap and water prior to dressing change. Peri-Wound Care Topical Primary Dressing Dakin's solution Discharge Instruction: apply dakin's moistened gauze to wound bed Secondary Dressing Woven Gauze Sponge, Non-Sterile 4x4 in Discharge Instruction: Apply over primary dressing as directed. Woven Gauze Sponges  2x2 in Discharge Instruction: Apply over primary dressing as directed. Bordered Gauze, 2x3.75  in Discharge Instruction: Apply over primary dressing as directed. Secured With 14M Medipore H Soft Cloth Surgical Tape, 2x2 (in/yd) Discharge Instruction: Secure dressing with tape as directed. Compression Wrap Compression Stockings Add-Ons Electronic Signature(s) Signed: 08/28/2021 5:43:37 PM By: [name] RN, BSN Signed: 08/28/2021 5:55:37 PM By: Baruch Gouty RN, BSN Entered By: [name] on 08/28/2021 10:10:21 -------------------------------------------------------------------------------- Vitals Details Patient Name: Date of Service: [name] INS, [place] [name] 08/28/2021 9:30 A M Medical Record Number: [id number] Patient Account Number: [id number] Date of Birth/Sex: Treating RN: [birth year] (70 y.o. [name] Primary Care Asuna Peth: [name] Other Clinician: Referring Chastidy Ranker: Treating Connie Hilgert/Extender: [name], [name]: 53 Vital Signs Time Taken: 10:02 Temperature (F): 97.9 Height (in): 63 Pulse (bpm): 76 Source: Stated Respiratory Rate (breaths/min): 18 Weight (lbs): 176 Blood Pressure (mmHg): 158/73 Source: Stated Reference Range: 80 - 120 mg / dl Body Mass Index (BMI): 31.2 Electronic Signature(s) Signed: 08/28/2021 5:55:37 PM By: Baruch Gouty RN, BSN Entered By: Baruch Gouty on 08/28/2021 10:03:19

## 2021-08-29 ENCOUNTER — Ambulatory Visit
Admission: RE | Admit: 2021-08-29 | Discharge: 2021-08-29 | Disposition: A | Discharge status: Home / Self Care | Payer: Medicare Other | Source: Ambulatory Visit | Attending: Hematology & Oncology | Admitting: Hematology & Oncology

## 2021-08-29 DIAGNOSIS — Z1231 Encounter for screening mammogram for malignant neoplasm of breast: Secondary | ICD-10-CM | POA: Diagnosis not present

## 2021-09-11 ENCOUNTER — Other Ambulatory Visit: Payer: Self-pay

## 2021-09-11 ENCOUNTER — Encounter (HOSPITAL_BASED_OUTPATIENT_CLINIC_OR_DEPARTMENT_OTHER): Payer: Medicare Other | Admitting: Physician Assistant

## 2021-09-11 DIAGNOSIS — Z9221 Personal history of antineoplastic chemotherapy: Secondary | ICD-10-CM | POA: Diagnosis not present

## 2021-09-11 DIAGNOSIS — L598 Other specified disorders of the skin and subcutaneous tissue related to radiation: Secondary | ICD-10-CM | POA: Diagnosis not present

## 2021-09-11 DIAGNOSIS — I10 Essential (primary) hypertension: Secondary | ICD-10-CM | POA: Diagnosis not present

## 2021-09-11 DIAGNOSIS — L589 Radiodermatitis, unspecified: Secondary | ICD-10-CM | POA: Diagnosis not present

## 2021-09-11 DIAGNOSIS — L98492 Non-pressure chronic ulcer of skin of other sites with fat layer exposed: Secondary | ICD-10-CM | POA: Diagnosis not present

## 2021-09-11 DIAGNOSIS — Z86718 Personal history of other venous thrombosis and embolism: Secondary | ICD-10-CM | POA: Diagnosis not present

## 2021-09-11 NOTE — Progress Notes (Signed)
[name], [name] ([id number]) Visit Report for 09/11/2021 Arrival Information Details Patient Name: Date of Service: [name] 09/11/2021 9:30 A M Medical Record Number: [id number] Patient Account Number: [id number] Date of Birth/Sex: Treating RN: [birth year] (70 y.o. [name], [name] Primary Care Kitt Ledet: [name] Other Clinician: Referring Trisha Ken: Treating Syna Gad/Extender: [name], [name]: 51 Visit Information History Since Last Visit Added or deleted any medications: No Patient Arrived: Ambulatory Any new allergies or adverse reactions: No Arrival Time: 09:18 Had a fall or experienced change in No Accompanied By: spouse activities of daily living that may affect Transfer Assistance: None risk of falls: Patient Identification Verified: Yes Signs or symptoms of abuse/neglect since last visito No Secondary Verification Process Completed: Yes Hospitalized since last visit: No Patient Requires Transmission-Based Precautions: No Implantable device outside of the clinic excluding No Patient Has Alerts: No cellular tissue based products placed in the center since last visit: Has Dressing in Place as Prescribed: Yes Pain Present Now: No Electronic Signature(s) Signed: 09/11/2021 4:52:58 PM By: Baruch Gouty RN, BSN Entered By: Baruch Gouty on 09/11/2021 09:25:22 -------------------------------------------------------------------------------- Encounter Discharge Information Details Patient Name: Date of Service: [name], [place] YE 09/11/2021 9:30 A M Medical Record Number: [id number] Patient Account Number: [id number] Date of Birth/Sex: Treating RN: [birth year] (70 y.o. [name] Primary Care Chelsia Serres: [name] Other Clinician: Referring Edelyn Heidel: Treating Omnia Dollinger/Extender: [name], [name]: 4 Encounter Discharge Information Items Post Procedure Vitals Discharge Condition:  Stable Temperature (F): 98.3 Ambulatory Status: Ambulatory Pulse (bpm): 84 Discharge Destination: Home Respiratory Rate (breaths/min): 18 Transportation: Private Auto Blood Pressure (mmHg): 154/71 Accompanied By: spouse Schedule Follow-up Appointment: Yes Clinical Summary of Care: Patient Declined Electronic Signature(s) Signed: 09/11/2021 4:52:58 PM By: Baruch Gouty RN, BSN Entered By: Baruch Gouty on 09/11/2021 10:20:38 -------------------------------------------------------------------------------- Lower Extremity Assessment Details Patient Name: Date of Service: [name], [name] 09/11/2021 9:30 A M Medical Record Number: [id number] Patient Account Number: [id number] Date of Birth/Sex: Treating RN: 07/29/1951 (70 y.o. [name] Primary Care Dshawn Mcnay: [name] Other Clinician: Referring Kenniel Bergsma: Treating Sharnita Bogucki/Extender: [name], [name]: 55 Electronic Signature(s) Signed: 09/11/2021 4:52:58 PM By: Baruch Gouty RN, BSN Entered By: Baruch Gouty on 09/11/2021 59:16:38 -------------------------------------------------------------------------------- Multi-Disciplinary Care Plan Details Patient Name: Date of Service: [name], [name] 09/11/2021 9:30 A M Medical Record Number: [id number] Patient Account Number: [id number] Date of Birth/Sex: Treating RN: 1951-05-31 (70 y.o. [name] Primary Care Eyden Dobie: [name] Other Clinician: Referring Alandra Sando: Treating Yohan Samons/Extender: [name], [name] in Treatment: [place] reviewed with physician Active Inactive Necrotic Tissue Nursing Diagnoses: Knowledge deficit related to management of necrotic/devitalized tissue Goals: Necrotic/devitalized tissue will be minimized in the wound bed Date Initiated: 07/10/2021 Target Resolution Date: 10/04/2021 Goal Status: Active Interventions: Provide education on  necrotic tissue and debridement process Treatment Activities: Excisional debridement : 07/10/2021 Notes: Wound/Skin Impairment Nursing Diagnoses: Impaired tissue integrity Knowledge deficit related to ulceration/compromised skin integrity Goals: Patient/caregiver will verbalize understanding of skin care regimen Date Initiated: 08/22/2020 Target Resolution Date: 10/04/2021 Goal Status: Active Ulcer/skin breakdown will have a volume reduction of 30% by week 4 Date Initiated: 08/22/2020 Date Inactivated: 10/03/2020 Target Resolution Date: 09/19/2020 Goal Status: Met Ulcer/skin breakdown will have a volume reduction of 50% by week 8 Date Initiated: 06/05/2021 Date Inactivated: 07/17/2021 Target Resolution Date: 07/03/2021 Goal Status: Unmet Unmet Reason: infection Interventions: Assess patient/caregiver ability to obtain necessary supplies Assess patient/caregiver ability to  perform ulcer/skin care regimen upon admission and as needed Assess ulceration(s) every visit Treatment Activities: Skin care regimen initiated : 08/22/2020 Topical wound management initiated : 08/22/2020 Notes: 06/05/21: Wound care regimen ongoing. Electronic Signature(s) Signed: 09/11/2021 4:52:58 PM By: Baruch Gouty RN, BSN Entered By: Baruch Gouty on 09/11/2021 09:33:18 -------------------------------------------------------------------------------- Pain Assessment Details Patient Name: Date of Service: [name], [name] 09/11/2021 9:30 A M Medical Record Number: [id number] Patient Account Number: [id number] Date of Birth/Sex: Treating RN: 1951/07/23 (70 y.o. [name] Primary Care Staysha Truby: [name] Other Clinician: Referring Angy Swearengin: Treating Sari Cogan/Extender: [name], [name]: 38 Active Problems Location of Pain Severity and Description of Pain Patient Has Paino No Site Locations Rate the pain. Current Pain Level: 0 Pain Management and  Medication Current Pain Management: Electronic Signature(s) Signed: 09/11/2021 4:52:58 PM By: Baruch Gouty RN, BSN Entered By: Baruch Gouty on 09/11/2021 09:26:17 -------------------------------------------------------------------------------- Patient/Caregiver Education Details Patient Name: Date of Service: [name], [name] 11/30/2022andnbsp9:30 A M Medical Record Number: [id number] Patient Account Number: [id number] Date of Birth/Gender: Treating RN: 08/15/1951 (69 y.o. [name] Primary Care Physician: [name] Other Clinician: Referring Physician: Treating Physician/Extender: [name], [name] in Treatment: 63 Education Assessment Education Provided To: Patient Education Topics Provided Wound Debridement: Methods: Explain/Verbal Responses: Reinforcements needed, State content correctly [employer]) Signed: 09/11/2021 4:52:58 PM By: Baruch Gouty RN, BSN Entered By: Baruch Gouty on 09/11/2021 09:33:47 -------------------------------------------------------------------------------- Wound Assessment Details Patient Name: Date of Service: [name], [place] YE 09/11/2021 9:30 A M Medical Record Number: [id number] Patient Account Number: [id number] Date of Birth/Sex: Treating RN: 1951/05/29 (70 y.o. [name], [name] Primary Care Weronika Birch: [name] Other Clinician: Referring Laysa Kimmey: Treating Cuyler Vandyken/Extender: [name], [name]: 55 Wound Status Wound Number: 1 Primary Soft Tissue Radionecrosis Etiology: Wound Location: Right Breast (mastectomy site) Wound Open Wounding Event: Radiation Burn Status: Date Acquired: 07/26/2020 Comorbid Anemia, Lymphedema, Deep Vein Thrombosis, Hypertension, Weeks Of Treatment: 55 History: Peripheral Venous Disease, Osteoarthritis, Received Clustered Wound: No Chemotherapy, Received Radiation, Confinement Anxiety Photos Wound  Measurements Length: (cm) 1.7 Width: (cm) 3.1 Depth: (cm) 0.8 Area: (cm) 4.139 Volume: (cm) 3.311 % Reduction in Area: -170.2% % Reduction in Volume: -2064.1% Epithelialization: Small (1-33%) Tunneling: No Undermining: No Wound Description Classification: Full Thickness Without Exposed Support Structu Wound Margin: Distinct, outline attached Exudate Amount: Medium Exudate Type: Serous Exudate Color: amber res Foul Odor After Cleansing: No Slough/Fibrino Yes Wound Bed Granulation Amount: Small (1-33%) Exposed Structure Granulation Quality: Pink Fascia Exposed: No Necrotic Amount: Large (67-100%) Fat Layer (Subcutaneous Tissue) Exposed: Yes Necrotic Quality: Adherent Slough Tendon Exposed: No Muscle Exposed: No Joint Exposed: No Bone Exposed: No Treatment Notes Wound #1 (Breast (mastectomy site)) Wound Laterality: Right Cleanser Soap and Water Discharge Instruction: May shower and wash wound with dial antibacterial soap and water prior to dressing change. Peri-Wound Care Topical Primary Dressing Dakin's solution Discharge Instruction: apply dakin's moistened gauze to wound bed Secondary Dressing Woven Gauze Sponge, Non-Sterile 4x4 in Discharge Instruction: Apply over primary dressing as directed. Woven Gauze Sponges 2x2 in Discharge Instruction: Apply over primary dressing as directed. Bordered Gauze, 2x3.75 in Discharge Instruction: Apply over primary dressing as directed. Secured With 50M [place] [facility] T ape, 2x2 (in/yd) Discharge Instruction: Secure dressing with tape as directed. Compression Wrap Compression Stockings Add-Ons Electronic Signature(s) Signed: 09/11/2021 3:47:30 PM By: Sandre Kitty Signed: 09/11/2021 4:52:58 PM By: Baruch Gouty RN, BSN Entered By: Sandre Kitty on 09/11/2021 09:32:06 --------------------------------------------------------------------------------  Vitals Details Patient Name: Date of Service: [name], [place] YE 09/11/2021 9:30 A M Medical Record Number: [id number] Patient Account Number: [id number] Date of Birth/Sex: Treating RN: Feb 06, 1951 (70 y.o. [name] Primary Care Pattie Flaharty: [name] Other Clinician: Referring Harel Repetto: Treating Nashya Garlington/Extender: [name], [name]: 55 Vital Signs Time Taken: 09:25 Temperature (F): 98.3 Height (in): 63 Pulse (bpm): 84 Weight (lbs): 176 Respiratory Rate (breaths/min): 18 Body Mass Index (BMI): 31.2 Blood Pressure (mmHg): 154/71 Reference Range: 80 - 120 mg / dl Electronic Signature(s) Signed: 09/11/2021 4:52:58 PM By: Baruch Gouty RN, BSN Entered By: Baruch Gouty on 09/11/2021 09:25:49

## 2021-09-11 NOTE — Progress Notes (Addendum)
BAYLER, NEHRING ([id number]) Visit Report for 09/11/2021 Chief Complaint Document Details Patient Name: Date of Service: [name] 09/11/2021 9:30 A M Medical Record Number: [id number] Patient Account Number: [id number] Date of Birth/Sex: Treating RN: 12-May-1951 (70 y.o. 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 [name], [name] Primary Care Provider: [name] Other Clinician: Referring Provider: Treating Provider/Extender: [name], [name]: 55 Debridement Performed for Assessment: Wound #1 Right Breast (mastectomy site) Performed By: Physician Worthy Keeler, PA Debridement Type: Debridement Level of Consciousness (Pre-procedure): Awake and Alert Pre-procedure Verification/Time Out Yes - 10:10 Taken: Start Time: 10:10 Pain Control: Lidocaine 4% T opical Solution T Area Debrided (L x W): otal 1.7 (cm) x 3.1 (cm) = 5.27 (cm) Tissue and other material debrided: Viable, Non-Viable, Slough, Subcutaneous, Slough Level: Skin/Subcutaneous Tissue Debridement Description: Excisional Instrument: Curette Bleeding: Minimum Hemostasis Achieved: Pressure Procedural Pain: 3 Post Procedural Pain: 1 Response to Treatment: Procedure was tolerated well Level of Consciousness (Post- Awake  and Alert procedure): Post Debridement Measurements of Total Wound Length: (cm) 1.7 Width: (cm) 3.1 Depth: (cm) 0.8 Volume: (cm) 3.311 Character of Wound/Ulcer Post Debridement: Requires Further Debridement Post Procedure Diagnosis Same as Pre-procedure Electronic Signature(s) Signed: 09/11/2021 4:52:58 PM By: Baruch Gouty RN, BSN Signed: 09/11/2021 6:11:30 PM By: Worthy Keeler PA-C Entered By: Baruch Gouty on 09/11/2021 10:16:36 -------------------------------------------------------------------------------- HPI Details Patient Name: Date of Service: [name], [name] 09/11/2021 9:30 A M Medical Record Number: [id number] Patient Account Number: [id number] Date of Birth/Sex: Treating RN: 1951-01-16 (70 y.o. [name] Primary Care Provider: [name] Other Clinician: Referring Provider: Treating Provider/Extender: [name], [name]: 39 History of Present Illness HPI Description: 08/22/2020 upon evaluation today patient actually appears to be doing somewhat poorly in regard to her mastectomy site on the right chest wall. She had a mastectomy initially in 1998. She had chemotherapy only no radiation following. In 2002 she subsequently did undergo radiation for the first time and then subsequently in 2012 had repeat radiation after having had a finding that was consistent with a relapse of the cancer. 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 She also has radiation damage to the skin over the right chest wall where she had the mastectomy. She was referred to [place] by Dr. Matthew Saras in [facility] and her oncologist is Dr. Burney Gauze. Subsequently I want to make sure before we delve into this more deeply that the patient does not have any issues with a return of cancer that needs to be managed by them. Obviously she does have significant scar tissue which may be benefited secondary  to the soft tissue radionecrosis by hyperbaric oxygen therapy in the absence of any recurrence of the cancer. Nonetheless she does not remember exactly when her last PET scan was but it was not too recently she tells me. 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 The patient does have hypertension  as well. She sees her oncologist next on December 6. 09/12/2020 on evaluation today patient actually appears to be doing excellent in regard to her wound under the right breast location. Currently this is measuring smaller in general seems to be doing very well. She tells me that the collagen is doing a good job and that the dressing that she has been putting on is not causing any troubles as far pulling on her skin or scar tissue is concerned all of which is excellent news. 10/03/2020 upon evaluation today patient appears to be doing well with regard to her surgical site from her mastectomy on the right breast region. She tells me that she has had very little bleeding nothing seems to be sticking badly and in general she has been extremely pleased with where things stand today. No fevers, chills, nausea, vomiting, or diarrhea. 10/24/2020 upon evaluation today patient actually appears to be doing excellent in regard to her wound. There is just a very small area still open and to be honest there was minimal slough noted on the surface of the wound nothing that requires sharp debridement. In general I feel like she is actually doing quite well and overall I am pleased. I do think we may switch to silver alginate dressing and also contemplating using a AandE ointment in order to help keep everything moist and the scar tissue region while the alginate keeps it dry enough to heal 11/14/2020 upon evaluation today patient appears to be doing well at this point in regard to her wound. I do feel like that she is making good progress again this is a very difficult region over the surgical site of the right  chest wall at the site of mastectomy. Nonetheless I think that we are just a very small area still open I think alginate is doing a good job helping to dry this up and I would recommend this such that we continue with this. 01/02/2021 on evaluation today patient's wound actually appears to be doing decently well. There does not appear to be any signs of active infection which is great news. She does have some slight slough buildup with biofilm on the surface of the wound mainly more biofilm. Nonetheless I was able to gently remove this with saline and gauze as well as a sterile Q-tip. She tolerated that today without complication. 01/30/2021 upon evaluation today patient appears to be doing well with regard to her wound although she still has quite a bit of trouble getting this to close I think the scar tissue and radiation damage is quite significant here unfortunately. There does not appear to be any signs of active infection which is great news. No fevers, chills, nausea, vomiting, or diarrhea. 02/27/2021 upon evaluation today patient appears to be doing excellent in regard to her wound. She has been tolerating the dressing changes without complication and in general I am extremely pleased with where things stand today. There does not appear to be any signs of infection which is great news. No fevers, chills, nausea, vomiting, or diarrhea. With that being said I do think that she still developing some slough buildup. I did discuss with her today the possibility of proceeding with HBO therapy. We have had this discussion before but she really is not quite committed to wanting to do that much and spend that much time in the chamber. She wants to still think about it. 03/27/2021 upon evaluation today patient appears to be doing about the same in regard to her  wound. She still developing a lot of slough buildup on the surface of the wound. I did try to clean that away to some degree today. She tolerated  that without any pain or complication. Subsequently I am thinking we may want to switch over to [facility] see if this may do better for her. Patient is in agreement with giving that a trial. 05/01/2021 upon evaluation today patient appears to be doing about the same in regard to her wounds. She has been tolerating the dressing changes without complication. Fortunately there does not appear to be any signs of active infection at this time which is great news. No fevers, chills, nausea, vomiting, or diarrhea.. 06/05/2021 upon evaluation today patient appears to be doing pretty well currently in regard to her wound at the mastectomy site right chest wall. Overall since we switch back to the alginate she tells me things have significantly improved she is much happier with where things stand currently. Fortunately there does not appear to be any signs of active infection which is great news. No fevers, chills, nausea, vomiting, or diarrhea. 07/10/2021 upon evaluation today patient unfortunately does not appear to be doing nearly as well as what she previously was. There does not appear to be any evidence of new epithelial growth she has a lot of necrotic tissue the base of the wound this is much more significant than what we previously noted. She also has been having increased pain and increased drainage. In general I am very concerned about this especially in light of the fact that she does have a history of breast cancer I want to ensure that were not looking at any cancerous type lesion at this point. Otherwise also think she does need some debridement we did do MolecuLight scanning today. 07/17/2021 upon evaluation today patient appears to be doing well with regard to her wound compared to last week although she still having some erythema I am concerned in this regard. I do think that she fortunately had a negative biopsy which is great news unfortunately she did have Pseudomonas noted as a bacteria present  in the culture which I think is good and need to be addressed with Levaquin. I Minna send that into the pharmacy today. We did repeat the MolecuLight screening. 07/31/2021 upon evaluation today patient's wound is actually showing signs of improvement which is great news. Fortunately there does not appear to be any signs of infection currently locally nor systemically and I am very pleased in that regard. With that being said the patient is having some issues here with continued necrotic tissue I think packing with the Dakin's moistened gauze dressing is still in the right leg ago. 08/14/2021 upon evaluation today patient appears to be doing well with regard to her wound all things considered I do not see any signs of significant infection which is great news. Fortunately there does not appear to be any evidence of infection which is also great news. In general I think that the patient is tolerating the dressing changes well. We been using a Dakin's moistened gauze. 08/28/2021 upon evaluation today patient appears to be doing well with regard to her wound. Worsening signs of definite improvement which is great news and overall very pleased with where we stand today. There is no signs of inflamed tissue or infection which is great as well and I think the Dakin's is doing a great job here. 09/11/2021 upon evaluation today patient appears to be doing well with regard to her wound all  things considered. I am can perform some debridement today to clear away some of the necrotic debris with that being said overall I think that she is making decently good progress here which is great news. There does not appear to be any signs of active infection also good news. That in fact is probably the best news in her mind. Electronic Signature(s) Signed: 09/11/2021 6:00:17 PM By: Worthy Keeler PA-C Entered By: Worthy Keeler on 09/11/2021  18:00:17 -------------------------------------------------------------------------------- Physical Exam Details Patient Name: Date of Service: [name], [name] 09/11/2021 9:30 A M Medical Record Number: [id number] Patient Account Number: [id number] Date of Birth/Sex: Treating RN: 06/17/1951 (70 y.o. [name] Primary Care Provider: [name] Other Clinician: Referring Provider: Treating Provider/Extender: [name], [name]: 57 Constitutional [name] and well-hydrated in no acute distress. Respiratory normal breathing without difficulty. Psychiatric this patient is able to make decisions and demonstrates good insight into disease process. Alert and Oriented x 3. pleasant and cooperative. Notes Upon inspection patient's wound did require sharp debridement to clear away some of the slough and necrotic debris on the surface of the wound down to good subcutaneous tissue patient tolerated this today without complication postdebridement wound bed appears to be doing much better. Electronic Signature(s) Signed: 09/11/2021 6:00:31 PM By: Worthy Keeler PA-C Entered By: Worthy Keeler on 09/11/2021 18:00:31 -------------------------------------------------------------------------------- Physician Orders Details Patient Name: Date of Service: [name], [name] 09/11/2021 9:30 A M Medical Record Number: [id number] Patient Account Number: [id number] Date of Birth/Sex: Treating RN: Jul 03, 1951 (70 y.o. [name] Primary Care Provider: [name] Other Clinician: Referring Provider: Treating Provider/Extender: [name], [name]: 58 Verbal / Phone Orders: No Diagnosis Coding ICD-10 Coding Code Description [phone] Non-pressure chronic ulcer of skin of other sites with fat layer exposed L58.9 Radiodermatitis, unspecified L59.8 Other specified disorders of the skin and subcutaneous tissue  related to radiation I10 Essential (primary) hypertension Follow-up Appointments ppointment in 2 weeks. Jeri Cos PA Return A Bathing/ Shower/ Hygiene May shower and wash wound with soap and water. - on days that dressing is changed Additional Orders / Instructions Follow Nutritious Diet Wound Treatment Wound #1 - Breast (mastectomy site) Wound Laterality: Right Cleanser: Soap and Water 1 x Per Day/30 Days Discharge Instructions: May shower and wash wound with dial antibacterial soap and water prior to dressing change. Prim Dressing: Dakin's solution 1 x Per Day/30 Days ary Discharge Instructions: apply dakin's moistened gauze to wound bed Secondary Dressing: Woven Gauze Sponge, Non-Sterile 4x4 in (Generic) 1 x Per Day/30 Days Discharge Instructions: Apply over primary dressing as directed. Secondary Dressing: Woven Gauze Sponges 2x2 in (DME) (Generic) 1 x Per Day/30 Days Discharge Instructions: Apply over primary dressing as directed. Secondary Dressing: Bordered Gauze, 2x3.75 in (DME) (Generic) 1 x Per Day/30 Days Discharge Instructions: Apply over primary dressing as directed. Secured With: 26M Medipore H Soft Cloth Surgical Tape, 2x2 (in/yd) (Generic) 1 x Per Day/30 Days Discharge Instructions: Secure dressing with tape as directed. Electronic Signature(s) Signed: 09/12/2021 5:08:19 PM By: Baruch Gouty RN, BSN Signed: 09/12/2021 5:34:28 PM By: Worthy Keeler PA-C Previous Signature: 09/11/2021 4:52:58 PM Version By: Baruch Gouty RN, BSN Previous Signature: 09/11/2021 6:11:30 PM Version By: Worthy Keeler PA-C Entered By: Baruch Gouty on 09/12/2021 16:25:16 -------------------------------------------------------------------------------- Problem List Details Patient Name: Date of Service: [name], [place] YE 09/11/2021 9:30 A M Medical Record Number: [id number] Patient Account Number: [id number] Date of Birth/Sex: Treating  RN: [birth year] (70 y.o. [name] Primary  Care Provider: [name] Other Clinician: Referring Provider: Treating Provider/Extender: [name], [name]: 55 Active Problems ICD-10 Encounter Code Description Active Date MDM Diagnosis [phone] Non-pressure chronic ulcer of skin of other sites with fat layer exposed 08/22/2020 No Yes L58.9 Radiodermatitis, unspecified 08/22/2020 No Yes L59.8 Other specified disorders of the skin and subcutaneous tissue related to 08/22/2020 No Yes radiation I10 Essential (primary) hypertension 08/22/2020 No Yes Inactive Problems Resolved Problems Electronic Signature(s) Signed: 09/11/2021 9:59:17 AM By: Worthy Keeler PA-C Entered By: Worthy Keeler on 09/11/2021 09:59:17 -------------------------------------------------------------------------------- Progress Note Details Patient Name: Date of Service: [name], [name] 09/11/2021 9:30 A M Medical Record Number: [id number] Patient Account Number: [id number] Date of Birth/Sex: Treating RN: 08/15/1951 (70 y.o. 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 She had a mastectomy initially in 1998. She had chemotherapy only no radiation following. In 2002 she subsequently did undergo radiation for the first time and then subsequently in 2012 had repeat radiation after having had a finding that was consistent with a relapse of the cancer. 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 She also has radiation damage to the skin over the  right chest wall where she had the mastectomy. She was referred to [place] by Dr. Matthew Saras in [facility] and her oncologist is Dr. Burney Gauze. Subsequently I want to make sure before we delve into this more deeply that the patient does not have any issues with a return of cancer that needs to be managed by them. 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 Nonetheless she does not remember exactly when her last PET scan was but it was not too recently she tells me. 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 The patient does have hypertension as well. She sees her oncologist next on December 6. 09/12/2020 on evaluation today patient actually appears to be doing excellent in regard to her wound under the right breast location. Currently this is measuring smaller in general seems to be doing very well. She tells me that the collagen is doing a good job and that the dressing that she has been putting on is not causing any troubles as far pulling on her skin or scar tissue is concerned all of which is excellent news. 10/03/2020 upon evaluation today patient appears to be doing well with regard to her surgical site from her mastectomy on the right breast region. She tells me that she has had very little bleeding nothing seems to be sticking badly and in general she has been extremely pleased with where things stand today. No fevers, chills, nausea, vomiting, or diarrhea. 10/24/2020 upon evaluation today patient actually appears to be doing excellent in regard to her wound. There is just a very small area still open and to be honest there was minimal slough noted on the surface of the wound nothing that requires sharp debridement. In general  I feel like she is actually doing quite well and overall I am pleased. I do think we may switch to silver  alginate dressing and also contemplating using a AandE ointment in order to help keep everything moist and the scar tissue region while the alginate keeps it dry enough to heal 11/14/2020 upon evaluation today patient appears to be doing well at this point in regard to her wound. I do feel like that she is making good progress again this is a very difficult region over the surgical site of the right chest wall at the site of mastectomy. Nonetheless I think that we are just a very small area still open I think alginate is doing a good job helping to dry this up and I would recommend this such that we continue with this. 01/02/2021 on evaluation today patient's wound actually appears to be doing decently well. There does not appear to be any signs of active infection which is great news. She does have some slight slough buildup with biofilm on the surface of the wound mainly more biofilm. Nonetheless I was able to gently remove this with saline and gauze as well as a sterile Q-tip. She tolerated that today without complication. 01/30/2021 upon evaluation today patient appears to be doing well with regard to her wound although she still has quite a bit of trouble getting this to close I think the scar tissue and radiation damage is quite significant here unfortunately. There does not appear to be any signs of active infection which is great news. No fevers, chills, nausea, vomiting, or diarrhea. 02/27/2021 upon evaluation today patient appears to be doing excellent in regard to her wound. She has been tolerating the dressing changes without complication and in general I am extremely pleased with where things stand today. There does not appear to be any signs of infection which is great news. No fevers, chills, nausea, vomiting, or diarrhea. With that being said I do think that she still developing some slough buildup. I did discuss with her today the possibility of proceeding with HBO therapy. We have had this  discussion before but she really is not quite committed to wanting to do that much and spend that much time in the chamber. She wants to still think about it. 03/27/2021 upon evaluation today patient appears to be doing about the same in regard to her wound. She still developing a lot of slough buildup on the surface of the wound. I did try to clean that away to some degree today. She tolerated that without any pain or complication. Subsequently I am thinking we may want to switch over to [facility] see if this may do better for her. Patient is in agreement with giving that a trial. 05/01/2021 upon evaluation today patient appears to be doing about the same in regard to her wounds. She has been tolerating the dressing changes without complication. Fortunately there does not appear to be any signs of active infection at this time which is great news. No fevers, chills, nausea, vomiting, or diarrhea.. 06/05/2021 upon evaluation today patient appears to be doing pretty well currently in regard to her wound at the mastectomy site right chest wall. Overall since we switch back to the alginate she tells me things have significantly improved she is much happier with where things stand currently. Fortunately there does not appear to be any signs of active infection which is great news. No fevers, chills, nausea, vomiting, or diarrhea. 07/10/2021 upon evaluation today patient unfortunately does  not appear to be doing nearly as well as what she previously was. There does not appear to be any evidence of new epithelial growth she has a lot of necrotic tissue the base of the wound this is much more significant than what we previously noted. She also has been having increased pain and increased drainage. In general I am very concerned about this especially in light of the fact that she does have a history of breast cancer I want to ensure that were not looking at any cancerous type lesion at this point. Otherwise also  think she does need some debridement we did do MolecuLight scanning today. 07/17/2021 upon evaluation today patient appears to be doing well with regard to her wound compared to last week although she still having some erythema I am concerned in this regard. I do think that she fortunately had a negative biopsy which is great news unfortunately she did have Pseudomonas noted as a bacteria present in the culture which I think is good and need to be addressed with Levaquin. I Minna send that into the pharmacy today. We did repeat the MolecuLight screening. 07/31/2021 upon evaluation today patient's wound is actually showing signs of improvement which is great news. Fortunately there does not appear to be any signs of infection currently locally nor systemically and I am very pleased in that regard. With that being said the patient is having some issues here with continued necrotic tissue I think packing with the Dakin's moistened gauze dressing is still in the right leg ago. 08/14/2021 upon evaluation today patient appears to be doing well with regard to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

## 2021-09-18 ENCOUNTER — Other Ambulatory Visit: Payer: Self-pay | Admitting: Hematology & Oncology

## 2021-09-23 DIAGNOSIS — R7303 Prediabetes: Secondary | ICD-10-CM | POA: Diagnosis not present

## 2021-09-23 DIAGNOSIS — E782 Mixed hyperlipidemia: Secondary | ICD-10-CM | POA: Diagnosis not present

## 2021-09-25 ENCOUNTER — Other Ambulatory Visit: Payer: Self-pay

## 2021-09-25 ENCOUNTER — Encounter (HOSPITAL_BASED_OUTPATIENT_CLINIC_OR_DEPARTMENT_OTHER): Payer: Medicare Other | Attending: Physician Assistant | Admitting: Physician Assistant

## 2021-09-25 DIAGNOSIS — L98492 Non-pressure chronic ulcer of skin of other sites with fat layer exposed: Secondary | ICD-10-CM | POA: Diagnosis not present

## 2021-09-25 DIAGNOSIS — L598 Other specified disorders of the skin and subcutaneous tissue related to radiation: Secondary | ICD-10-CM | POA: Insufficient documentation

## 2021-09-25 DIAGNOSIS — I10 Essential (primary) hypertension: Secondary | ICD-10-CM | POA: Insufficient documentation

## 2021-09-25 DIAGNOSIS — L589 Radiodermatitis, unspecified: Secondary | ICD-10-CM | POA: Insufficient documentation
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
## 2021-09-25 NOTE — Progress Notes (Addendum)
BONNE, WHACK ([id number]) Visit Report for 09/25/2021 Chief Complaint Document Details Patient Name: Date of Service: [name] YE 09/25/2021 9:30 A M Medical Record Number: [id number] Patient Account Number: [id number] Date of Birth/Sex: Treating RN: April 07, 1951 (70 y.o. 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 [name] Primary Care Provider: [name] Other Clinician: Referring Provider: Treating Provider/Extender: [name], [name]: 72 History of Present Illness HPI Description: 08/22/2020 upon evaluation today patient actually appears to be doing somewhat poorly in regard to her mastectomy site on the right chest wall. She had a mastectomy initially in 1998. She had chemotherapy only no radiation following. In 2002 she subsequently did undergo radiation for the first time and then subsequently in 2012 had repeat radiation after having had a finding that was consistent with a relapse of the cancer. 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 She also has radiation damage to the skin over the right chest wall where she had the mastectomy. She was referred to [place] by Dr. Matthew Saras in [facility] and her oncologist is Dr. Burney Gauze.  Subsequently I want to make sure before we delve into this more deeply that the patient does not have any issues with a return of cancer that needs to be managed by them. 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 Nonetheless she does not remember exactly when her last PET scan was but it was not too recently she tells me. 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 The patient does have hypertension as well. She sees her oncologist next on December 6. 09/12/2020 on evaluation today patient actually appears to be doing excellent in regard to her wound under the right breast location. Currently this is measuring smaller in general seems to be doing very well. She tells me that the collagen is doing a good job and that the dressing that she has been putting on is not causing any troubles as far pulling on her skin or scar tissue is concerned all of which is excellent news. 10/03/2020 upon evaluation today patient appears to be doing well with regard to her surgical site from her mastectomy on the right breast region. She tells me that she has had very little bleeding nothing seems to be sticking badly and in general she has been extremely pleased with where things stand today. No fevers, chills, nausea, vomiting, or diarrhea. 10/24/2020 upon evaluation today patient actually appears to be doing excellent in regard to her wound. There is just a very small area still open and to be honest there was minimal slough noted on the surface of the wound nothing that requires sharp debridement. In general I feel like she is actually doing quite well and overall I am pleased. I do think we may switch to silver alginate dressing and  also contemplating using a AandE ointment in order to help keep everything moist and the scar tissue region while the alginate  keeps it dry enough to heal 11/14/2020 upon evaluation today patient appears to be doing well at this point in regard to her wound. I do feel like that she is making good progress again this is a very difficult region over the surgical site of the right chest wall at the site of mastectomy. Nonetheless I think that we are just a very small area still open I think alginate is doing a good job helping to dry this up and I would recommend this such that we continue with this. 01/02/2021 on evaluation today patient's wound actually appears to be doing decently well. There does not appear to be any signs of active infection which is great news. She does have some slight slough buildup with biofilm on the surface of the wound mainly more biofilm. Nonetheless I was able to gently remove this with saline and gauze as well as a sterile Q-tip. She tolerated that today without complication. 01/30/2021 upon evaluation today patient appears to be doing well with regard to her wound although she still has quite a bit of trouble getting this to close I think the scar tissue and radiation damage is quite significant here unfortunately. There does not appear to be any signs of active infection which is great news. No fevers, chills, nausea, vomiting, or diarrhea. 02/27/2021 upon evaluation today patient appears to be doing excellent in regard to her wound. She has been tolerating the dressing changes without complication and in general I am extremely pleased with where things stand today. There does not appear to be any signs of infection which is great news. No fevers, chills, nausea, vomiting, or diarrhea. With that being said I do think that she still developing some slough buildup. I did discuss with her today the possibility of proceeding with HBO therapy. We have had this discussion before but she really is not quite committed to wanting to do that much and spend that much time in the chamber. She wants to still think  about it. 03/27/2021 upon evaluation today patient appears to be doing about the same in regard to her wound. She still developing a lot of slough buildup on the surface of the wound. I did try to clean that away to some degree today. She tolerated that without any pain or complication. Subsequently I am thinking we may want to switch over to [facility] see if this may do better for her. Patient is in agreement with giving that a trial. 05/01/2021 upon evaluation today patient appears to be doing about the same in regard to her wounds. She has been tolerating the dressing changes without complication. Fortunately there does not appear to be any signs of active infection at this time which is great news. No fevers, chills, nausea, vomiting, or diarrhea.. 06/05/2021 upon evaluation today patient appears to be doing pretty well currently in regard to her wound at the mastectomy site right chest wall. Overall since we switch back to the alginate she tells me things have significantly improved she is much happier with where things stand currently. Fortunately there does not appear to be any signs of active infection which is great news. No fevers, chills, nausea, vomiting, or diarrhea. 07/10/2021 upon evaluation today patient unfortunately does not appear to be doing nearly as well as what she previously was. There does not appear to be any evidence of new epithelial growth  she has a lot of necrotic tissue the base of the wound this is much more significant than what we previously noted. She also has been having increased pain and increased drainage. In general I am very concerned about this especially in light of the fact that she does have a history of breast cancer I want to ensure that were not looking at any cancerous type lesion at this point. Otherwise also think she does need some debridement we did do MolecuLight scanning today. 07/17/2021 upon evaluation today patient appears to be doing well with regard  to her wound compared to last week although she still having some erythema I am concerned in this regard. I do think that she fortunately had a negative biopsy which is great news unfortunately she did have Pseudomonas noted as a bacteria present in the culture which I think is good and need to be addressed with Levaquin. I Minna send that into the pharmacy today. We did repeat the MolecuLight screening. 07/31/2021 upon evaluation today patient's wound is actually showing signs of improvement which is great news. Fortunately there does not appear to be any signs of infection currently locally nor systemically and I am very pleased in that regard. With that being said the patient is having some issues here with continued necrotic tissue I think packing with the Dakin's moistened gauze dressing is still in the right leg ago. 08/14/2021 upon evaluation today patient appears to be doing well with regard to her wound all things considered I do not see any signs of significant infection which is great news. Fortunately there does not appear to be any evidence of infection which is also great news. In general I think that the patient is tolerating the dressing changes well. We been using a Dakin's moistened gauze. 08/28/2021 upon evaluation today patient appears to be doing well with regard to her wound. Worsening signs of definite improvement which is great news and overall very pleased with where we stand today. There is no signs of inflamed tissue or infection which is great as well and I think the Dakin's is doing a great job here. 09/11/2021 upon evaluation today patient appears to be doing well with regard to her wound all things considered. I am can perform some debridement today to clear away some of the necrotic debris with that being said overall I think that she is making decently good progress here which is great news. There does not appear to be any signs of active infection also good news. That  in fact is probably the best news in her mind. 09/25/2021 upon evaluation today patient appears to be doing decently well in regard to her wound. Overall I do not see any signs of infection and I think she is doing quite well. I do believe that we are making headway here although is very slow due to the scarring and the depth of the wound this is a significantly deep wound. Electronic Signature(s) Signed: 09/25/2021 12:49:45 PM By: Worthy Keeler PA-C Entered By: Worthy Keeler on 09/25/2021 12:49:44 -------------------------------------------------------------------------------- Physical Exam Details Patient Name: Date of Service: [name], [name] 09/25/2021 9:30 A M Medical Record Number: [id number] Patient Account Number: [id number] Date of Birth/Sex: Treating RN: 1951/10/04 (70 y.o. [name] Primary Care Provider: [name] Other Clinician: Referring Provider: Treating Provider/Extender: [name], [name]: 23 Constitutional [name] and well-hydrated in no acute distress. Respiratory normal breathing without difficulty. Psychiatric this patient is able to make  decisions and demonstrates good insight into disease process. Alert and Oriented x 3. pleasant and cooperative. Notes Upon inspection patient's wound bed actually showed signs of good granulation and epithelization at this point around the edges although centrally there is still some tissue that is very scarred and necrotic to be honest although it is getting difficult to even be able to remove a lot of this necrotic tissue. I did not perform any debridement today I think we will still get a continue with the Dakin's moistened gauze packing which does seem to be helping to be honest. Electronic Signature(s) Signed: 09/25/2021 12:50:13 PM By: Worthy Keeler PA-C Entered By: Worthy Keeler on 09/25/2021  12:50:13 -------------------------------------------------------------------------------- Physician Orders Details Patient Name: Date of Service: [name], [name] 09/25/2021 9:30 A M Medical Record Number: [id number] Patient Account Number: [id number] Date of Birth/Sex: Treating RN: October 20, 1950 (70 y.o. [name] Primary Care Provider: [name] Other Clinician: Referring Provider: Treating Provider/Extender: [name], [name]: 9 Verbal / Phone Orders: No Diagnosis Coding ICD-10 Coding Code Description [phone] Non-pressure chronic ulcer of skin of other sites with fat layer exposed L58.9 Radiodermatitis, unspecified L59.8 Other specified disorders of the skin and subcutaneous tissue related to radiation I10 Essential (primary) hypertension Follow-up Appointments Return appointment in 3 weeks. Jeri Cos PA Bathing/ Shower/ Hygiene May shower and wash wound with soap and water. - on days that dressing is changed Additional Orders / Instructions Follow Nutritious Diet Wound Treatment Wound #1 - Breast (mastectomy site) Wound Laterality: Right Cleanser: Soap and Water 1 x Per Day/30 Days Discharge Instructions: May shower and wash wound with dial antibacterial soap and water prior to dressing change. Prim Dressing: Dakin's solution 1 x Per Day/30 Days ary Discharge Instructions: apply dakin's moistened gauze to wound bed Secondary Dressing: Woven Gauze Sponge, Non-Sterile 4x4 in (Generic) 1 x Per Day/30 Days Discharge Instructions: Apply over primary dressing as directed. Secondary Dressing: Woven Gauze Sponges 2x2 in (Generic) 1 x Per Day/30 Days Discharge Instructions: Apply over primary dressing as directed. Secondary Dressing: Zetuvit Plus Silicone Border Dressing 4x4 (in/in) (DME) (Generic) 1 x Per Day/30 Days Discharge Instructions: Apply silicone border over primary dressing as directed. Secured With: 19M Medipore H Soft Cloth  Surgical Tape, 2x2 (in/yd) (Generic) 1 x Per Day/30 Days Discharge Instructions: Secure dressing with tape as directed. Electronic Signature(s) Signed: 09/25/2021 3:33:06 PM By: Worthy Keeler PA-C Signed: 09/25/2021 5:27:53 PM By: [name] RN, BSN Entered By: [name] on 09/25/2021 10:20:26 -------------------------------------------------------------------------------- Problem List Details Patient Name: Date of Service: [name], [name] 09/25/2021 9:30 A M Medical Record Number: [id number] Patient Account Number: [id number] Date of Birth/Sex: Treating RN: 01-03-1951 (70 y.o. [name] Primary Care Provider: [name] Other Clinician: Referring Provider: Treating Provider/Extender: [name], [name]: 61 Active Problems ICD-10 Encounter Code Description Active Date MDM Diagnosis L98.492 Non-pressure chronic ulcer of skin of other sites with fat layer exposed 08/22/2020 No Yes L58.9 Radiodermatitis, unspecified 08/22/2020 No Yes L59.8 Other specified disorders of the skin and subcutaneous tissue related to 08/22/2020 No Yes radiation I10 Essential (primary) hypertension 08/22/2020 No Yes Inactive Problems Resolved Problems Electronic Signature(s) Signed: 09/25/2021 9:59:07 AM By: Worthy Keeler PA-C Entered By: Worthy Keeler on 09/25/2021 09:59:06 -------------------------------------------------------------------------------- Progress Note Details Patient Name: Date of Service: [name], [name] 09/25/2021 9:30 A M Medical Record Number: [id number] Patient Account Number: [id number] Date of Birth/Sex: Treating RN: 1951-03-06 (70  y.o. [name] Primary Care Provider: [name] Other Clinician: Referring Provider: Treating Provider/Extender: [name], Beatriz Chancellor Weeks in Treatment: 87 Subjective Chief Complaint Information obtained from Patient Right Breast soft tissue  radionecrosis following mastectomy History of Present Illness (HPI) 08/22/2020 upon evaluation today patient actually appears to be doing somewhat poorly in regard to her mastectomy site on the right chest wall. She had a mastectomy initially in 1998. She had chemotherapy only no radiation following. In 2002 she subsequently did undergo radiation for the first time and then subsequently in 2012 had repeat radiation after having had a finding that was consistent with a relapse of the cancer. 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 She also has radiation damage to the skin over the right chest wall where she had the mastectomy. She was referred to [place] by Dr. Matthew Saras in [facility] and her oncologist is Dr. Burney Gauze. Subsequently I want to make sure before we delve into this more deeply that the patient does not have any issues with a return of cancer that needs to be managed by them. 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 Nonetheless she does not remember exactly when her last PET scan was but it was not too recently she tells me. 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 The patient does have hypertension as well. She sees her oncologist next on December 6. 09/12/2020 on evaluation today patient actually appears to be doing excellent in regard to her wound under the right breast location. Currently this is measuring smaller in general seems to be doing very well. She tells me that the collagen is doing a good job and that the dressing that she has been putting on is not causing any troubles as far pulling on her skin or scar tissue is concerned all of which is excellent news. 10/03/2020 upon evaluation today patient appears to be doing well with regard to her surgical site from  her mastectomy on the right breast region. She tells me that she has had very little bleeding nothing seems to be sticking badly and in general she has been extremely pleased with where things stand today. No fevers, chills, nausea, vomiting, or diarrhea. 10/24/2020 upon evaluation today patient actually appears to be doing excellent in regard to her wound. There is just a very small area still open and to be honest there was minimal slough noted on the surface of the wound nothing that requires sharp debridement. In general I feel like she is actually doing quite well and overall I am pleased. I do think we may switch to silver alginate dressing and also contemplating using a AandE ointment in order to help keep everything moist and the scar tissue region while the alginate keeps it dry enough to heal 11/14/2020 upon evaluation today patient appears to be doing well at this point in regard to her wound. I do feel like that she is making good progress again this is a very difficult region over the surgical site of the right chest wall at the site of mastectomy. Nonetheless I think that we are just a very small area still open I think alginate is doing a good job helping to dry this up and I would recommend this such that we continue with  this. 01/02/2021 on evaluation today patient's wound actually appears to be doing decently well. There does not appear to be any signs of active infection which is great news. She does have some slight slough buildup with biofilm on the surface of the wound mainly more biofilm. Nonetheless I was able to gently remove this with saline and gauze as well as a sterile Q-tip. She tolerated that today without complication. 01/30/2021 upon evaluation today patient appears to be doing well with regard to her wound although she still has quite a bit of trouble getting this to close I think the scar tissue and radiation damage is quite significant here unfortunately. There does not  appear to be any signs of active infection which is great news. No fevers, chills, nausea, vomiting, or diarrhea. 02/27/2021 upon evaluation today patient appears to be doing excellent in regard to her wound. She has been tolerating the dressing changes without complication and in general I am extremely pleased with where things stand today. There does not appear to be any signs of infection which is great news. No fevers, chills, nausea, vomiting, or diarrhea. With that being said I do think that she still developing some slough buildup. I did discuss with her today the possibility of proceeding with HBO therapy. We have had this discussion before but she really is not quite committed to wanting to do that much and spend that much time in the chamber. She wants to still think about it. 03/27/2021 upon evaluation today patient appears to be doing about the same in regard to her wound. She still developing a lot of slough buildup on the surface of the wound. I did try to clean that away to some degree today. She tolerated that without any pain or complication. Subsequently I am thinking we may want to switch over to [facility] see if this may do better for her. Patient is in agreement with giving that a trial. 05/01/2021 upon evaluation today patient appears to be doing about the same in regard to her wounds. She has been tolerating the dressing changes without complication. Fortunately there does not appear to be any signs of active infection at this time which is great news. No fevers, chills, nausea, vomiting, or diarrhea.. 06/05/2021 upon evaluation today patient appears to be doing pretty well currently in regard to her wound at the mastectomy site right chest wall. Overall since we switch back to the alginate she tells me things have significantly improved she is much happier with where things stand currently. Fortunately there does not appear to be any signs of active infection which is great news. No  fevers, chills, nausea, vomiting, or diarrhea. 07/10/2021 upon evaluation today patient unfortunately does not appear to be doing nearly as well as what she previously was. There does not appear to be any evidence of new epithelial growth she has a lot of necrotic tissue the base of the wound this is much more significant than what we previously noted. She also has been having increased pain and increased drainage. In general I am very concerned about this especially in light of the fact that she does have a history of breast cancer I want to ensure that were not looking at any cancerous type lesion at this point. Otherwise also think she does need some debridement we did do MolecuLight scanning today. 07/17/2021 upon evaluation today patient appears to be doing well with regard to her wound compared to last week although she still having some erythema I am  concerned in this regard. I do think that she fortunately had a negative biopsy which is great news unfortunately she did have Pseudomonas noted as a bacteria present in the culture which I think is good and need to be addressed with Levaquin. I Minna send that into the pharmacy today. We did repeat the MolecuLight screening. 07/31/2021 upon evaluation today patient's wound is actually showing signs of improvement which is great news. Fortunately there does not appear to be any signs of infection currently locally nor systemically and I am very pleased in that regard. With that being said the patient is having some issues here with continued necrotic tissue I think packing with the Dakin's moistened gauze dressing is still in the right leg ago. 08/14/2021 upon evaluation today patient appears to be doing well with regard to her wound all things considered I do not see any signs of significant infection which is great news. Fortunately there does not appear to be any evidence of infection which is also great news. In general I think that the patient is  tolerating the dressing changes well. We been using a Dakin's moistened gauze. 08/28/2021 upon evaluation today patient appears to be doing well with regard to her wound. Worsening signs of definite improvement which is great news and overall very pleased with where we stand today. There is no signs of inflamed tissue or infection which is great as well and I think the Dakin's is doing a great job here. 09/11/2021 upon evaluation today patient appears to be doing well with regard to her wound all things considered. I am can perform some debridement today to clear away some of the necrotic debris with that being said overall I think that she is making decently good progress here which is great news. There does not appear to be any signs of active infection also good news. That in fact is probably the best news in her mind. 09/25/2021 upon evaluation today patient appears to be doing decently well in regard to her wound. Overall I do not see any signs of infection and I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

## 2021-09-26 DIAGNOSIS — L602 Onychogryphosis: Secondary | ICD-10-CM | POA: Diagnosis not present

## 2021-10-02 DIAGNOSIS — E782 Mixed hyperlipidemia: Secondary | ICD-10-CM | POA: Diagnosis not present

## 2021-10-02 DIAGNOSIS — I10 Essential (primary) hypertension: Secondary | ICD-10-CM | POA: Diagnosis not present

## 2021-10-02 DIAGNOSIS — M2550 Pain in unspecified joint: Secondary | ICD-10-CM | POA: Diagnosis not present

## 2021-10-02 DIAGNOSIS — R7303 Prediabetes: Secondary | ICD-10-CM | POA: Diagnosis not present

## 2021-10-16 ENCOUNTER — Encounter (HOSPITAL_BASED_OUTPATIENT_CLINIC_OR_DEPARTMENT_OTHER): Payer: Medicare Other | Attending: Physician Assistant | Admitting: Physician Assistant

## 2021-10-16 ENCOUNTER — Other Ambulatory Visit: Payer: Self-pay

## 2021-10-16 DIAGNOSIS — Z853 Personal history of malignant neoplasm of breast: Secondary | ICD-10-CM | POA: Diagnosis not present

## 2021-10-16 DIAGNOSIS — I10 Essential (primary) hypertension: Secondary | ICD-10-CM | POA: Diagnosis not present

## 2021-10-16 DIAGNOSIS — I890 Lymphedema, not elsewhere classified: Secondary | ICD-10-CM | POA: Diagnosis not present

## 2021-10-16 DIAGNOSIS — L598 Other specified disorders of the skin and subcutaneous tissue related to radiation: Secondary | ICD-10-CM | POA: Diagnosis not present

## 2021-10-16 DIAGNOSIS — Z9221 Personal history of antineoplastic chemotherapy: Secondary | ICD-10-CM | POA: Diagnosis not present

## 2021-10-16 DIAGNOSIS — L98492 Non-pressure chronic ulcer of skin of other sites with fat layer exposed: Secondary | ICD-10-CM | POA: Insufficient documentation

## 2021-10-16 DIAGNOSIS — Z9011 Acquired absence of right breast and nipple: Secondary | ICD-10-CM | POA: Diagnosis not present
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
## 2021-10-16 NOTE — Progress Notes (Signed)
RAYNETTE, ARRAS ([id number]) Visit Report for 10/16/2021 Chief Complaint Document Details Patient Name: Date of Service: [name] 10/16/2021 9:30 A M Medical Record Number: [id number] Patient Account Number: [id number] Date of Birth/Sex: Treating RN: Feb 02, 1951 (71 y.o. 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 [name] Primary Care Provider: [name] Other Clinician: Referring Provider: Treating Provider/Extender: [name], [name]: 60 Debridement Performed for Assessment: Wound #1 Right Breast (mastectomy site) Performed By: Clinician Baruch Gouty, RN Debridement Type: Chemical/Enzymatic/Mechanical Agent Used: Santyl Level of Consciousness (Pre-procedure): Awake and Alert Pre-procedure Verification/Time Out No Taken: Bleeding: None Response to Treatment: Procedure was tolerated well Level of Consciousness (Post- Awake and Alert procedure): Post Debridement Measurements of Total Wound Length: (cm) 1.9 Width: (cm) 2.9 Depth: (cm) 0.7 Volume: (cm) 3.029 Character of Wound/Ulcer Post Debridement: Requires Further Debridement Post Procedure Diagnosis Same as Pre-procedure Electronic Signature(s) Signed: 10/16/2021 4:51:59 PM By: Worthy Keeler PA-C Signed: 10/16/2021  5:43:10 PM By: Baruch Gouty RN, BSN Entered By: Baruch Gouty on 10/16/2021 10:04:52 -------------------------------------------------------------------------------- HPI Details Patient Name: Date of Service: [name], [name] 10/16/2021 9:30 A M Medical Record Number: [id number] Patient Account Number: [id number] Date of Birth/Sex: Treating RN: 01-Aug-1951 (71 y.o. [name] Primary Care Provider: [name] Other Clinician: Referring Provider: Treating Provider/Extender: [name], [name]: 60 History of Present Illness HPI Description: 08/22/2020 upon evaluation today patient actually appears to be doing somewhat poorly in regard to her mastectomy site on the right chest wall. She had a mastectomy initially in 1998. She had chemotherapy only no radiation following. In 2002 she subsequently did undergo radiation for the first time and then subsequently in 2012 had repeat radiation after having had a finding that was consistent with a relapse of the cancer. 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 She also has radiation damage to the skin over the right chest wall where she had the mastectomy. She was referred to [place] by Dr. Matthew Saras in [facility] and her oncologist is Dr. Burney Gauze. Subsequently I want to make sure before we delve into this more deeply that the patient does not have any issues with a return of cancer that needs to be managed by them. 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 Nonetheless she does not remember exactly when her last PET scan was but it was not too recently she tells me. 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 The patient does have hypertension as well. She sees her oncologist next on December  6. 09/12/2020 on evaluation today patient actually appears to be doing excellent in regard to her wound under the right breast location. Currently this is measuring smaller in general seems to be doing very well. She tells me that the collagen is doing a  good job and that the dressing that she has been putting on is not causing any troubles as far pulling on her skin or scar tissue is concerned all of which is excellent news. 10/03/2020 upon evaluation today patient appears to be doing well with regard to her surgical site from her mastectomy on the right breast region. She tells me that she has had very little bleeding nothing seems to be sticking badly and in general she has been extremely pleased with where things stand today. No fevers, chills, nausea, vomiting, or diarrhea. 10/24/2020 upon evaluation today patient actually appears to be doing excellent in regard to her wound. There is just a very small area still open and to be honest there was minimal slough noted on the surface of the wound nothing that requires sharp debridement. In general I feel like she is actually doing quite well and overall I am pleased. I do think we may switch to silver alginate dressing and also contemplating using a AandE ointment in order to help keep everything moist and the scar tissue region while the alginate keeps it dry enough to heal 11/14/2020 upon evaluation today patient appears to be doing well at this point in regard to her wound. I do feel like that she is making good progress again this is a very difficult region over the surgical site of the right chest wall at the site of mastectomy. Nonetheless I think that we are just a very small area still open I think alginate is doing a good job helping to dry this up and I would recommend this such that we continue with this. 01/02/2021 on evaluation today patient's wound actually appears to be doing decently well. There does not appear to be any signs of active  infection which is great news. She does have some slight slough buildup with biofilm on the surface of the wound mainly more biofilm. Nonetheless I was able to gently remove this with saline and gauze as well as a sterile Q-tip. She tolerated that today without complication. 01/30/2021 upon evaluation today patient appears to be doing well with regard to her wound although she still has quite a bit of trouble getting this to close I think the scar tissue and radiation damage is quite significant here unfortunately. There does not appear to be any signs of active infection which is great news. No fevers, chills, nausea, vomiting, or diarrhea. 02/27/2021 upon evaluation today patient appears to be doing excellent in regard to her wound. She has been tolerating the dressing changes without complication and in general I am extremely pleased with where things stand today. There does not appear to be any signs of infection which is great news. No fevers, chills, nausea, vomiting, or diarrhea. With that being said I do think that she still developing some slough buildup. I did discuss with her today the possibility of proceeding with HBO therapy. We have had this discussion before but she really is not quite committed to wanting to do that much and spend that much time in the chamber. She wants to still think about it. 03/27/2021 upon evaluation today patient appears to be doing about the same in regard to her wound. She still developing a lot of slough buildup on the surface of the wound. I did try to clean that away to some degree today. She tolerated that without any pain or complication. Subsequently I am thinking we may want to switch over to [facility] see if this may do better for  her. Patient is in agreement with giving that a trial. 05/01/2021 upon evaluation today patient appears to be doing about the same in regard to her wounds. She has been tolerating the dressing changes without complication.  Fortunately there does not appear to be any signs of active infection at this time which is great news. No fevers, chills, nausea, vomiting, or diarrhea.. 06/05/2021 upon evaluation today patient appears to be doing pretty well currently in regard to her wound at the mastectomy site right chest wall. Overall since we switch back to the alginate she tells me things have significantly improved she is much happier with where things stand currently. Fortunately there does not appear to be any signs of active infection which is great news. No fevers, chills, nausea, vomiting, or diarrhea. 07/10/2021 upon evaluation today patient unfortunately does not appear to be doing nearly as well as what she previously was. There does not appear to be any evidence of new epithelial growth she has a lot of necrotic tissue the base of the wound this is much more significant than what we previously noted. She also has been having increased pain and increased drainage. In general I am very concerned about this especially in light of the fact that she does have a history of breast cancer I want to ensure that were not looking at any cancerous type lesion at this point. Otherwise also think she does need some debridement we did do MolecuLight scanning today. 07/17/2021 upon evaluation today patient appears to be doing well with regard to her wound compared to last week although she still having some erythema I am concerned in this regard. I do think that she fortunately had a negative biopsy which is great news unfortunately she did have Pseudomonas noted as a bacteria present in the culture which I think is good and need to be addressed with Levaquin. I Minna send that into the pharmacy today. We did repeat the MolecuLight screening. 07/31/2021 upon evaluation today patient's wound is actually showing signs of improvement which is great news. Fortunately there does not appear to be any signs of infection currently locally nor  systemically and I am very pleased in that regard. With that being said the patient is having some issues here with continued necrotic tissue I think packing with the Dakin's moistened gauze dressing is still in the right leg ago. 08/14/2021 upon evaluation today patient appears to be doing well with regard to her wound all things considered I do not see any signs of significant infection which is great news. Fortunately there does not appear to be any evidence of infection which is also great news. In general I think that the patient is tolerating the dressing changes well. We been using a Dakin's moistened gauze. 08/28/2021 upon evaluation today patient appears to be doing well with regard to her wound. Worsening signs of definite improvement which is great news and overall very pleased with where we stand today. There is no signs of inflamed tissue or infection which is great as well and I think the Dakin's is doing a great job here. 09/11/2021 upon evaluation today patient appears to be doing well with regard to her wound all things considered. I am can perform some debridement today to clear away some of the necrotic debris with that being said overall I think that she is making decently good progress here which is great news. There does not appear to be any signs of active infection also good news. That in fact  is probably the best news in her mind. 09/25/2021 upon evaluation today patient appears to be doing decently well in regard to her wound. Overall I do not see any signs of infection and I think she is doing quite well. I do believe that we are making headway here although is very slow due to the scarring and the depth of the wound this is a significantly deep wound. 10/16/2021 upon evaluation today patient appears to be doing about the same in regard to the wound on her right chest wall. Fortunately there does not appear to be any signs of active infection locally nor systemically which is  great news. With that being said this still was not cleaning up quite as quickly as I would like to see. Fortunately I think that we can definitely give the Santyl another try we tried this in the past and unfortunately did not have a good result but again I think she was infected at that time as well which was part of the issue. Nonetheless I think currently that we will be able to go ahead and see what we can do about getting this little cleaner with the Santyl. Electronic Signature(s) Signed: 10/16/2021 2:10:01 PM By: Worthy Keeler PA-C Entered By: Worthy Keeler on 10/16/2021 14:10:01 -------------------------------------------------------------------------------- Physical Exam Details Patient Name: Date of Service: [name], [name] 10/16/2021 9:30 A M Medical Record Number: [id number] Patient Account Number: [id number] Date of Birth/Sex: Treating RN: 02-15-1951 (71 y.o. [name] Primary Care Provider: [name] Other Clinician: Referring Provider: Treating Provider/Extender: [name], [name]: 61 Constitutional [name] and well-hydrated in no acute distress. Respiratory normal breathing without difficulty. Psychiatric this patient is able to make decisions and demonstrates good insight into disease process. Alert and Oriented x 3. pleasant and cooperative. Notes Upon inspection patient's wound bed actually showed signs of good granulation epithelization at this point in some areas although she has a significant amount of necrotic tissue noted in general throughout the wound. Overall I think that we need to see what we can do to try to soften this up and get it out of there. Electronic Signature(s) Signed: 10/16/2021 2:10:15 PM By: Worthy Keeler PA-C Entered By: Worthy Keeler on 10/16/2021 14:10:15 -------------------------------------------------------------------------------- Physician Orders Details Patient Name: Date of  Service: [name], [name] 10/16/2021 9:30 A M Medical Record Number: [id number] Patient Account Number: [id number] Date of Birth/Sex: Treating RN: 10-26-1950 (71 y.o. [name] Primary Care Provider: [name] Other Clinician: Referring Provider: Treating Provider/Extender: [name], [name]: [phone] Verbal / Phone Orders: No Diagnosis Coding ICD-10 Coding Code Description [phone] Non-pressure chronic ulcer of skin of other sites with fat layer exposed L58.9 Radiodermatitis, unspecified L59.8 Other specified disorders of the skin and subcutaneous tissue related to radiation I10 Essential (primary) hypertension Follow-up Appointments ppointment in 2 weeks. Jeri Cos, [facility] Return A Bathing/ Shower/ Hygiene May shower and wash wound with soap and water. - on days that dressing is changed Additional Orders / Instructions Follow Nutritious Diet Wound Treatment Wound #1 - Breast (mastectomy site) Wound Laterality: Right Cleanser: Soap and Water 1 x Per Day/30 Days Discharge Instructions: May shower and wash wound with dial antibacterial soap and water prior to dressing change. Prim Dressing: Santyl Ointment 1 x Per Day/30 Days ary Discharge Instructions: Apply nickel thick amount to wound bed as instructed Secondary Dressing: Woven Gauze Sponge, Non-Sterile 4x4 in (DME) (Generic) 1 x Per Day/30  Days Discharge Instructions: Apply over primary dressing as directed. Secondary Dressing: Woven Gauze Sponges 2x2 in (DME) (Generic) 1 x Per Day/30 Days Discharge Instructions: Apply over primary dressing, fold in corners to fill the space Secured With: 8M Medipore H Soft Cloth Surgical Tape, 2x2 (in/yd) (DME) (Generic) 1 x Per Day/30 Days Discharge Instructions: Secure dressing with tape as directed. Electronic Signature(s) Signed: 10/16/2021 4:51:59 PM By: Worthy Keeler PA-C Signed: 10/16/2021 5:43:10 PM By: Baruch Gouty RN, BSN Entered By:  Baruch Gouty on 10/16/2021 10:11:53 -------------------------------------------------------------------------------- Problem List Details Patient Name: Date of Service: [name], [place] YE 10/16/2021 9:30 A M Medical Record Number: [id number] Patient Account Number: [id number] Date of Birth/Sex: Treating RN: Oct 14, 1950 (71 y.o. [name] Primary Care Provider: [name] Other Clinician: Referring Provider: Treating Provider/Extender: [name], [name]: 60 Active Problems ICD-10 Encounter Code Description Active Date MDM Diagnosis [phone] Non-pressure chronic ulcer of skin of other sites with fat layer exposed 08/22/2020 No Yes L58.9 Radiodermatitis, unspecified 08/22/2020 No Yes L59.8 Other specified disorders of the skin and subcutaneous tissue related to 08/22/2020 No Yes radiation I10 Essential (primary) hypertension 08/22/2020 No Yes Inactive Problems Resolved Problems Electronic Signature(s) Signed: 10/16/2021 10:16:30 AM By: Worthy Keeler PA-C Entered By: Worthy Keeler on 10/16/2021 10:16:30 -------------------------------------------------------------------------------- Progress Note Details Patient Name: Date of Service: [name], [name] 10/16/2021 9:30 A M Medical Record Number: [id number] Patient Account Number: [id number] Date of Birth/Sex: Treating RN: September 15, 1951 (71 y.o. 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 She had a mastectomy initially in 1998. She had chemotherapy only no  radiation following. In 2002 she subsequently did undergo radiation for the first time and then subsequently in 2012 had repeat radiation after having had a finding that was consistent with a relapse of the cancer. 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 She also has radiation damage to the skin over the right chest wall where she had the mastectomy. She was referred to [place] by Dr. Matthew Saras in [facility] and her oncologist is Dr. Burney Gauze. Subsequently I want to make sure before we delve into this more deeply that the patient does not have any issues with a return of cancer that needs to be managed by them. 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 Nonetheless she does not remember exactly when her last PET scan was but it was not too recently she tells me. 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 The patient does have hypertension as well. She sees her oncologist next on December 6. 09/12/2020 on evaluation today patient actually appears to be doing excellent in regard to her wound under the right breast location. Currently this is measuring smaller in general seems to be doing very well. She tells me that the collagen is doing a good job and that the dressing that she has been putting on is not causing any troubles as far pulling on her skin  or scar tissue is concerned all of which is excellent news. 10/03/2020 upon evaluation today patient appears to be doing well with regard to her surgical site from her mastectomy on the right breast region. She tells me that she has had very little bleeding nothing seems to be sticking badly and in general she has been extremely pleased with where things stand today. No fevers, chills, nausea, vomiting, or diarrhea. 10/24/2020 upon  evaluation today patient actually appears to be doing excellent in regard to her wound. There is just a very small area still open and to be honest there was minimal slough noted on the surface of the wound nothing that requires sharp debridement. In general I feel like she is actually doing quite well and overall I am pleased. I do think we may switch to silver alginate dressing and also contemplating using a AandE ointment in order to help keep everything moist and the scar tissue region while the alginate keeps it dry enough to heal 11/14/2020 upon evaluation today patient appears to be doing well at this point in regard to her wound. I do feel like that she is making good progress again this is a very difficult region over the surgical site of the right chest wall at the site of mastectomy. Nonetheless I think that we are just a very small area still open I think alginate is doing a good job helping to dry this up and I would recommend this such that we continue with this. 01/02/2021 on evaluation today patient's wound actually appears to be doing decently well. There does not appear to be any signs of active infection which is great news. She does have some slight slough buildup with biofilm on the surface of the wound mainly more biofilm. Nonetheless I was able to gently remove this with saline and gauze as well as a sterile Q-tip. She tolerated that today without complication. 01/30/2021 upon evaluation today patient appears to be doing well with regard to her wound although she still has quite a bit of trouble getting this to close I think the scar tissue and radiation damage is quite significant here unfortunately. There does not appear to be any signs of active infection which is great news. No fevers, chills, nausea, vomiting, or diarrhea. 02/27/2021 upon evaluation today patient appears to be doing excellent in regard to her wound. She has been tolerating the dressing changes  without complication and in general I am extremely pleased with where things stand today. There does not appear to be any signs of infection which is great news. No fevers, chills, nausea, vomiting, or diarrhea. With that being said I do think that she still developing some slough buildup. I did discuss with her today the possibility of proceeding with HBO therapy. We have had this discussion before but she really is not quite committed to wanting to do that much and spend that much time in the chamber. She wants to still think about it. 03/27/2021 upon evaluation today patient appears to be doing about the same in regard to her wound. She still developing a lot of slough buildup on the surface of the wound. I did try to clean that away to some degree today. She tolerated that without any pain or complication. Subsequently I am thinking we may want to switch over to [facility] see if this may do better for her. Patient is in agreement with giving that a trial. 05/01/2021 upon evaluation today patient appears to be doing about the same  in regard to her wounds. She has been tolerating the dressing changes without complication. Fortunately there does not appear to be any signs of active infection at this time which is great news. No fevers, chills, nausea, vomiting, or diarrhea.. 06/05/2021 upon evaluation today patient appears to be doing pretty well currently in regard to her wound at the mastectomy site right chest wall. Overall since we switch back to the alginate she tells me things have significantly improved she is much happier with where things stand currently. Fortunately there does not appear to be any signs of active infection which is great news. No fevers, chills, nausea, vomiting, or diarrhea. 07/10/2021 upon evaluation today patient unfortunately does not appear to be doing nearly as well as what she previously was. There does not appear to be any evidence of new epithelial growth she has a lot of  necrotic tissue the base of the wound this is much more significant than what we previously noted. She also has been having increased pain and increased drainage. In general I am very concerned about this especially in light of the fact that she does have a history of breast cancer I want to ensure that were not looking at any cancerous type lesion at this point. Otherwise also think she does need some debridement we did do MolecuLight scanning today. 07/17/2021 upon evaluation today patient appears to be doing well with regard to her wound compared to last week although she still having some erythema I am concerned in this regard. I do think that she fortunately had a negative biopsy which is great news unfortunately she did have Pseudomonas noted as a bacteria present in the culture which I think is good and need to be addressed with Levaquin. I Minna send that into the pharmacy today. We did repeat the [employer] screening. 07/31/2021 upon evaluation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

## 2021-10-28 DIAGNOSIS — Z6833 Body mass index (BMI) 33.0-33.9, adult: Secondary | ICD-10-CM | POA: Diagnosis not present

## 2021-10-28 DIAGNOSIS — R768 Other specified abnormal immunological findings in serum: Secondary | ICD-10-CM | POA: Diagnosis not present

## 2021-10-28 DIAGNOSIS — R700 Elevated erythrocyte sedimentation rate: Secondary | ICD-10-CM | POA: Diagnosis not present

## 2021-10-28 DIAGNOSIS — M2550 Pain in unspecified joint: Secondary | ICD-10-CM | POA: Diagnosis not present

## 2021-10-30 ENCOUNTER — Encounter (HOSPITAL_BASED_OUTPATIENT_CLINIC_OR_DEPARTMENT_OTHER): Payer: Medicare Other | Admitting: Physician Assistant

## 2021-11-06 ENCOUNTER — Encounter (HOSPITAL_BASED_OUTPATIENT_CLINIC_OR_DEPARTMENT_OTHER): Payer: Medicare Other | Admitting: Physician Assistant

## 2021-11-06 ENCOUNTER — Other Ambulatory Visit: Payer: Self-pay

## 2021-11-06 DIAGNOSIS — Z853 Personal history of malignant neoplasm of breast: Secondary | ICD-10-CM | POA: Diagnosis not present

## 2021-11-06 DIAGNOSIS — I890 Lymphedema, not elsewhere classified: Secondary | ICD-10-CM | POA: Diagnosis not present

## 2021-11-06 DIAGNOSIS — Z9221 Personal history of antineoplastic chemotherapy: Secondary | ICD-10-CM | POA: Diagnosis not present

## 2021-11-06 DIAGNOSIS — L598 Other specified disorders of the skin and subcutaneous tissue related to radiation: Secondary | ICD-10-CM | POA: Diagnosis not present

## 2021-11-06 DIAGNOSIS — I10 Essential (primary) hypertension: Secondary | ICD-10-CM | POA: Diagnosis not present

## 2021-11-06 DIAGNOSIS — L98492 Non-pressure chronic ulcer of skin of other sites with fat layer exposed: Secondary | ICD-10-CM | POA: Diagnosis not present

## 2021-11-06 DIAGNOSIS — Z9011 Acquired absence of right breast and nipple: Secondary | ICD-10-CM | POA: Diagnosis not present

## 2021-11-06 NOTE — Progress Notes (Addendum)
[name], [name] ([id number]) Visit Report for 11/06/2021 Chief Complaint Document Details Patient Name: Date of Service: [name] 11/06/2021 9:30 A M Medical Record Number: [id number] Patient Account Number: [id number] Date of Birth/Sex: Treating RN: [birth year] (71 y.o. 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 [name] Primary Care Provider: [name] Other Clinician: Referring Provider: Treating Provider/Extender: [name], [name] in Treatment: 63 Debridement Performed for Assessment: Wound #1 Right Breast (mastectomy site) Performed By: Clinician [name], RN Debridement Type: Chemical/Enzymatic/Mechanical Agent Used: Santyl Level of Consciousness (Pre-procedure): Awake and Alert Pre-procedure Verification/Time Out No Taken: Bleeding: None Response to Treatment: Procedure was tolerated well Level of Consciousness (Post- Awake and Alert procedure): Post Debridement Measurements of Total Wound Length: (cm) 1.5 Width: (cm) 3 Depth: (cm) 0.7 Volume: (cm) 2.474 Character of Wound/Ulcer Post Debridement: Stable Post Procedure Diagnosis Same as Pre-procedure Electronic Signature(s) Signed: 11/06/2021 3:45:35 PM By: Worthy Keeler PA-C Signed: 11/06/2021 4:52:26 PM By: [name] RN, BSN Entered By: [name] on 11/06/2021 10:30:51 -------------------------------------------------------------------------------- HPI Details Patient Name: Date of Service: [name], [name] 11/06/2021 9:30 A M Medical Record Number: [id number] Patient Account Number: [id number] Date of Birth/Sex: Treating RN: [birth year] (71 y.o. [name] Primary Care Provider: [name] Other Clinician: Referring Provider: Treating Provider/Extender: [name], [name]: 47 History of Present Illness HPI Description: 08/22/2020 upon evaluation today patient actually appears to be doing somewhat poorly in regard to her mastectomy site on the right chest wall. She had a mastectomy initially in 1998. She had chemotherapy only no radiation following. In 2002 she subsequently did undergo radiation for the first time and then subsequently in 2012 had repeat radiation after having had a finding that was consistent with a relapse of the cancer. 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 She also has radiation damage to the skin over the right chest wall where she had the mastectomy. She was referred to [place] by Dr. Matthew Saras in [facility] and her oncologist is Dr. Burney Gauze. Subsequently I want to make sure before we delve into this more deeply that the patient does not have any issues with a return of cancer that needs to be managed by them. 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 Nonetheless she does not remember exactly when her last PET scan was but it was not too recently she tells me. 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 The patient does have hypertension as well. She sees her oncologist next on December 6. 09/12/2020 on  evaluation today patient actually appears to be doing excellent in regard to her wound under the right breast location. Currently this is measuring smaller in general seems to be doing very well. She tells me that the collagen is doing a good job  and that the dressing that she has been putting on is not causing any troubles as far pulling on her skin or scar tissue is concerned all of which is excellent news. 10/03/2020 upon evaluation today patient appears to be doing well with regard to her surgical site from her mastectomy on the right breast region. She tells me that she has had very little bleeding nothing seems to be sticking badly and in general she has been extremely pleased with where things stand today. No fevers, chills, nausea, vomiting, or diarrhea. 10/24/2020 upon evaluation today patient actually appears to be doing excellent in regard to her wound. There is just a very small area still open and to be honest there was minimal slough noted on the surface of the wound nothing that requires sharp debridement. In general I feel like she is actually doing quite well and overall I am pleased. I do think we may switch to silver alginate dressing and also contemplating using a AandE ointment in order to help keep everything moist and the scar tissue region while the alginate keeps it dry enough to heal 11/14/2020 upon evaluation today patient appears to be doing well at this point in regard to her wound. I do feel like that she is making good progress again this is a very difficult region over the surgical site of the right chest wall at the site of mastectomy. Nonetheless I think that we are just a very small area still open I think alginate is doing a good job helping to dry this up and I would recommend this such that we continue with this. 01/02/2021 on evaluation today patient's wound actually appears to be doing decently well. There does not appear to be any signs of active infection which  is great news. She does have some slight slough buildup with biofilm on the surface of the wound mainly more biofilm. Nonetheless I was able to gently remove this with saline and gauze as well as a sterile Q-tip. She tolerated that today without complication. 01/30/2021 upon evaluation today patient appears to be doing well with regard to her wound although she still has quite a bit of trouble getting this to close I think the scar tissue and radiation damage is quite significant here unfortunately. There does not appear to be any signs of active infection which is great news. No fevers, chills, nausea, vomiting, or diarrhea. 02/27/2021 upon evaluation today patient appears to be doing excellent in regard to her wound. She has been tolerating the dressing changes without complication and in general I am extremely pleased with where things stand today. There does not appear to be any signs of infection which is great news. No fevers, chills, nausea, vomiting, or diarrhea. With that being said I do think that she still developing some slough buildup. I did discuss with her today the possibility of proceeding with HBO therapy. We have had this discussion before but she really is not quite committed to wanting to do that much and spend that much time in the chamber. She wants to still think about it. 03/27/2021 upon evaluation today patient appears to be doing about the same in regard to her wound. She still developing a lot of slough buildup on the surface of the wound. I did try to clean that away to some degree today. She tolerated that without any pain or complication. Subsequently I am thinking we may want to switch over to [facility] see if this may do better for her. Patient  is in agreement with giving that a trial. 05/01/2021 upon evaluation today patient appears to be doing about the same in regard to her wounds. She has been tolerating the dressing changes without complication. Fortunately there does  not appear to be any signs of active infection at this time which is great news. No fevers, chills, nausea, vomiting, or diarrhea.. 06/05/2021 upon evaluation today patient appears to be doing pretty well currently in regard to her wound at the mastectomy site right chest wall. Overall since we switch back to the alginate she tells me things have significantly improved she is much happier with where things stand currently. Fortunately there does not appear to be any signs of active infection which is great news. No fevers, chills, nausea, vomiting, or diarrhea. 07/10/2021 upon evaluation today patient unfortunately does not appear to be doing nearly as well as what she previously was. There does not appear to be any evidence of new epithelial growth she has a lot of necrotic tissue the base of the wound this is much more significant than what we previously noted. She also has been having increased pain and increased drainage. In general I am very concerned about this especially in light of the fact that she does have a history of breast cancer I want to ensure that were not looking at any cancerous type lesion at this point. Otherwise also think she does need some debridement we did do MolecuLight scanning today. 07/17/2021 upon evaluation today patient appears to be doing well with regard to her wound compared to last week although she still having some erythema I am concerned in this regard. I do think that she fortunately had a negative biopsy which is great news unfortunately she did have Pseudomonas noted as a bacteria present in the culture which I think is good and need to be addressed with Levaquin. I Minna send that into the pharmacy today. We did repeat the MolecuLight screening. 07/31/2021 upon evaluation today patient's wound is actually showing signs of improvement which is great news. Fortunately there does not appear to be any signs of infection currently locally nor systemically and I am  very pleased in that regard. With that being said the patient is having some issues here with continued necrotic tissue I think packing with the Dakin's moistened gauze dressing is still in the right leg ago. 08/14/2021 upon evaluation today patient appears to be doing well with regard to her wound all things considered I do not see any signs of significant infection which is great news. Fortunately there does not appear to be any evidence of infection which is also great news. In general I think that the patient is tolerating the dressing changes well. We been using a Dakin's moistened gauze. 08/28/2021 upon evaluation today patient appears to be doing well with regard to her wound. Worsening signs of definite improvement which is great news and overall very pleased with where we stand today. There is no signs of inflamed tissue or infection which is great as well and I think the Dakin's is doing a great job here. 09/11/2021 upon evaluation today patient appears to be doing well with regard to her wound all things considered. I am can perform some debridement today to clear away some of the necrotic debris with that being said overall I think that she is making decently good progress here which is great news. There does not appear to be any signs of active infection also good news. That in fact is probably  the best news in her mind. 09/25/2021 upon evaluation today patient appears to be doing decently well in regard to her wound. Overall I do not see any signs of infection and I think she is doing quite well. I do believe that we are making headway here although is very slow due to the scarring and the depth of the wound this is a significantly deep wound. 10/16/2021 upon evaluation today patient appears to be doing about the same in regard to the wound on her right chest wall. Fortunately there does not appear to be any signs of active infection locally nor systemically which is great news. With that  being said this still was not cleaning up quite as quickly as I would like to see. Fortunately I think that we can definitely give the Santyl another try we tried this in the past and unfortunately did not have a good result but again I think she was infected at that time as well which was part of the issue. Nonetheless I think currently that we will be able to go ahead and see what we can do about getting this little cleaner with the Santyl. 11/06/2021 upon evaluation today patient appears to be doing well with regard to her wound. This actually appears to be a little bit better with the Santyl I am happy in that regard. Fortunately I do not see any signs of active infection at this time. No fevers, chills, nausea, vomiting, or diarrhea. Electronic Signature(s) Signed: 11/06/2021 10:33:45 AM By: Worthy Keeler PA-C Entered By: Worthy Keeler on 11/06/2021 10:33:45 -------------------------------------------------------------------------------- Physical Exam Details Patient Name: Date of Service: [name], [name] 11/06/2021 9:30 A M Medical Record Number: [id number] Patient Account Number: [id number] Date of Birth/Sex: Treating RN: 08-10-1951 (71 y.o. [name] Primary Care Provider: [name] Other Clinician: Referring Provider: Treating Provider/Extender: [name], [name]: 30 Constitutional [name] and well-hydrated in no acute distress. Respiratory normal breathing without difficulty. Psychiatric this patient is able to make decisions and demonstrates good insight into disease process. Alert and Oriented x 3. pleasant and cooperative. Notes Upon inspection patient's wound bed showed signs of good granulation epithelization at this point. Fortunately I do not see any evidence of active infection locally nor systemically which is great and I think that she is doing well with the Santyl at this point. Hopefully this will continue to  soften up a lot of the necrotic tissue keeping down the biofilm. Electronic Signature(s) Signed: 11/06/2021 10:34:06 AM By: Worthy Keeler PA-C Entered By: Worthy Keeler on 11/06/2021 10:34:06 -------------------------------------------------------------------------------- Physician Orders Details Patient Name: Date of Service: [name], [name] 11/06/2021 9:30 A M Medical Record Number: [id number] Patient Account Number: [id number] Date of Birth/Sex: Treating RN: 1951-03-14 (71 y.o. [name] Primary Care Provider: [name] Other Clinician: Referring Provider: Treating Provider/Extender: [name], [name]: 22 Verbal / Phone Orders: No Diagnosis Coding ICD-10 Coding Code Description L98.492 Non-pressure chronic ulcer of skin of other sites with fat layer exposed L58.9 Radiodermatitis, unspecified L59.8 Other specified disorders of the skin and subcutaneous tissue related to radiation I10 Essential (primary) hypertension Follow-up Appointments Return appointment in 1 month. Margarita Grizzle Wednesday Bathing/ Shower/ Hygiene May shower and wash wound with soap and water. - on days that dressing is changed Additional Orders / Instructions Follow Nutritious Diet Wound Treatment Wound #1 - Breast (mastectomy site) Wound Laterality: Right Cleanser: Soap and Water 1 x Per Day/30  Days Discharge Instructions: May shower and wash wound with dial antibacterial soap and water prior to dressing change. Prim Dressing: Santyl Ointment 1 x Per Day/30 Days ary Discharge Instructions: Apply nickel thick amount to wound bed as instructed Secondary Dressing: Woven Gauze Sponge, Non-Sterile 4x4 in (Generic) 1 x Per Day/30 Days Discharge Instructions: Apply over primary dressing as directed. Secondary Dressing: Woven Gauze Sponges 2x2 in (Generic) 1 x Per Day/30 Days Discharge Instructions: Apply over primary dressing, fold in corners to fill the space Secured  With: 57M Medipore H Soft Cloth Surgical Tape, 2x2 (in/yd) (Generic) 1 x Per Day/30 Days Discharge Instructions: Secure dressing with tape as directed. Electronic Signature(s) Signed: 11/06/2021 3:45:35 PM By: Worthy Keeler PA-C Signed: 11/06/2021 4:52:26 PM By: [name] RN, BSN Entered By: [name] on 11/06/2021 10:30:13 -------------------------------------------------------------------------------- Problem List Details Patient Name: Date of Service: [name], [name] 11/06/2021 9:30 A M Medical Record Number: [id number] Patient Account Number: [id number] Date of Birth/Sex: Treating RN: [birth year] (71 y.o. [name] Primary Care Provider: [name] Other Clinician: Referring Provider: Treating Provider/Extender: [name], [name]: 78 Active Problems ICD-10 Encounter Code Description Active Date MDM Diagnosis L98.492 Non-pressure chronic ulcer of skin of other sites with fat layer exposed 08/22/2020 No Yes L58.9 Radiodermatitis, unspecified 08/22/2020 No Yes L59.8 Other specified disorders of the skin and subcutaneous tissue related to 08/22/2020 No Yes radiation I10 Essential (primary) hypertension 08/22/2020 No Yes Inactive Problems Resolved Problems Electronic Signature(s) Signed: 11/06/2021 9:31:27 AM By: Worthy Keeler PA-C Entered By: Worthy Keeler on 11/06/2021 09:31:27 -------------------------------------------------------------------------------- Progress Note Details Patient Name: Date of Service: [name], [name] 11/06/2021 9:30 A M Medical Record Number: [id number] Patient Account Number: [id number] Date of Birth/Sex: Treating RN: 01/16/1951 (71 y.o. [name] Primary Care Provider: [name] Other Clinician: Referring Provider: Treating Provider/Extender: [name], [name]: 66 Subjective Chief Complaint Information obtained from Patient Right Breast  soft tissue radionecrosis following mastectomy History of Present Illness (HPI) 08/22/2020 upon evaluation today patient actually appears to be doing somewhat poorly in regard to her mastectomy site on the right chest wall. She had a mastectomy initially in 1998. She had chemotherapy only no radiation following. In 2002 she subsequently did undergo radiation for the first time and then subsequently in 2012 had repeat radiation after having had a finding that was consistent with a relapse of the cancer. 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 She also has radiation damage to the skin over the right chest wall where she had the mastectomy. She was referred to [place] by Dr. Matthew Saras in [facility] and her oncologist is Dr. Burney Gauze. Subsequently I want to make sure before we delve into this more deeply that the patient does not have any issues with a return of cancer that needs to be managed by them. 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 Nonetheless she does not remember exactly when her last PET scan was but it was not too recently she tells me. 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 The patient does have hypertension as well. She sees her oncologist next on December 6. 09/12/2020 on evaluation today patient actually appears to be doing excellent in regard to her wound  under the right breast location. Currently this is measuring smaller in general seems to be doing very well. She tells me that the collagen is doing a good job and that the dressing that she has been putting on is not causing any troubles as far pulling on her skin or scar tissue is concerned all of which is excellent news. 10/03/2020 upon evaluation today patient appears to be doing well with regard to her  surgical site from her mastectomy on the right breast region. She tells me that she has had very little bleeding nothing seems to be sticking badly and in general she has been extremely pleased with where things stand today. No fevers, chills, nausea, vomiting, or diarrhea. 10/24/2020 upon evaluation today patient actually appears to be doing excellent in regard to her wound. There is just a very small area still open and to be honest there was minimal slough noted on the surface of the wound nothing that requires sharp debridement. In general I feel like she is actually doing quite well and overall I am pleased. I do think we may switch to silver alginate dressing and also contemplating using a AandE ointment in order to help keep everything moist and the scar tissue region while the alginate keeps it dry enough to heal 11/14/2020 upon evaluation today patient appears to be doing well at this point in regard to her wound. I do feel like that she is making good progress again this is a very difficult region over the surgical site of the right chest wall at the site of mastectomy. Nonetheless I think that we are just a very small area still open I think alginate is doing a good job helping to dry this up and I would recommend this such that we continue with this. 01/02/2021 on evaluation today patient's wound actually appears to be doing decently well. There does not appear to be any signs of active infection which is great news. She does have some slight slough buildup with biofilm on the surface of the wound mainly more biofilm. Nonetheless I was able to gently remove this with saline and gauze as well as a sterile Q-tip. She tolerated that today without complication. 01/30/2021 upon evaluation today patient appears to be doing well with regard to her wound although she still has quite a bit of trouble getting this to close I think the scar tissue and radiation damage is quite significant here  unfortunately. There does not appear to be any signs of active infection which is great news. No fevers, chills, nausea, vomiting, or diarrhea. 02/27/2021 upon evaluation today patient appears to be doing excellent in regard to her wound. She has been tolerating the dressing changes without complication and in general I am extremely pleased with where things stand today. There does not appear to be any signs of infection which is great news. No fevers, chills, nausea, vomiting, or diarrhea. With that being said I do think that she still developing some slough buildup. I did discuss with her today the possibility of proceeding with HBO therapy. We have had this discussion before but she really is not quite committed to wanting to do that much and spend that much time in the chamber. She wants to still think about it. 03/27/2021 upon evaluation today patient appears to be doing about the same in regard to her wound. She still developing a lot of slough buildup on the surface of the wound. I did try to clean that away to some degree  today. She tolerated that without any pain or complication. Subsequently I am thinking we may want to switch over to [facility] see if this may do better for her. Patient is in agreement with giving that a trial. 05/01/2021 upon evaluation today patient appears to be doing about the same in regard to her wounds. She has been tolerating the dressing changes without complication. Fortunately there does not appear to be any signs of active infection at this time which is great news. No fevers, chills, nausea, vomiting, or diarrhea.. 06/05/2021 upon evaluation today patient appears to be doing pretty well currently in regard to her wound at the mastectomy site right chest wall. Overall since we switch back to the alginate she tells me things have significantly improved she is much happier with where things stand currently. Fortunately there does not appear to be any signs of active  infection which is great news. No fevers, chills, nausea, vomiting, or diarrhea. 07/10/2021 upon evaluation today patient unfortunately does not appear to be doing nearly as well as what she previously was. There does not appear to be any evidence of new epithelial growth she has a lot of necrotic tissue the base of the wound this is much more significant than what we previously noted. She also has been having increased pain and increased drainage. In general I am very concerned about this especially in light of the fact that she does have a history of breast cancer I want to ensure that were not looking at any cancerous type lesion at this point. Otherwise also think she does need some debridement we did do MolecuLight scanning today. 07/17/2021 upon evaluation today patient appears to be doing well with regard to her wound compared to last week although she still having some erythema I am concerned in this regard. I do think that she fortunately had a

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
## 2021-11-14 ENCOUNTER — Ambulatory Visit: Payer: Medicare Other | Admitting: Hematology & Oncology

## 2021-11-14 ENCOUNTER — Other Ambulatory Visit: Payer: Medicare Other

## 2021-12-04 ENCOUNTER — Other Ambulatory Visit: Payer: Self-pay

## 2021-12-04 ENCOUNTER — Encounter (HOSPITAL_BASED_OUTPATIENT_CLINIC_OR_DEPARTMENT_OTHER): Payer: Medicare Other | Attending: Physician Assistant | Admitting: Physician Assistant

## 2021-12-04 DIAGNOSIS — L598 Other specified disorders of the skin and subcutaneous tissue related to radiation: Secondary | ICD-10-CM | POA: Diagnosis not present

## 2021-12-04 DIAGNOSIS — L98492 Non-pressure chronic ulcer of skin of other sites with fat layer exposed: Secondary | ICD-10-CM | POA: Insufficient documentation

## 2021-12-04 DIAGNOSIS — Z853 Personal history of malignant neoplasm of breast: Secondary | ICD-10-CM | POA: Insufficient documentation

## 2021-12-04 DIAGNOSIS — Z9011 Acquired absence of right breast and nipple: Secondary | ICD-10-CM | POA: Insufficient documentation

## 2021-12-04 DIAGNOSIS — Z9221 Personal history of antineoplastic chemotherapy: Secondary | ICD-10-CM | POA: Insufficient documentation

## 2021-12-04 DIAGNOSIS — I890 Lymphedema, not elsewhere classified: Secondary | ICD-10-CM | POA: Diagnosis not present

## 2021-12-04 DIAGNOSIS — Z86718 Personal history of other venous thrombosis and embolism: Secondary | ICD-10-CM | POA: Diagnosis not present

## 2021-12-04 DIAGNOSIS — I10 Essential (primary) hypertension: Secondary | ICD-10-CM | POA: Insufficient documentation

## 2021-12-04 DIAGNOSIS — N641 Fat necrosis of breast: Secondary | ICD-10-CM | POA: Insufficient documentation

## 2021-12-04 NOTE — Progress Notes (Addendum)
[name] ([id number]) Visit Report for 12/04/2021 Chief Complaint Document Details Patient Name: Date of Service: [name] [name] 12/04/2021 9:30 A M Medical Record Number: [id number] Patient Account Number: [id number] Date of Birth/Sex: Treating RN: [birth year] (71 y.o. 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 [name] Primary Care Provider: [name] Other Clinician: Referring Provider: Treating Provider/Extender: [name], [name]: 12 History of Present Illness HPI Description: 08/22/2020 upon evaluation today patient actually appears to be doing somewhat poorly in regard to her mastectomy site on the right chest wall. She had a mastectomy initially in 1998. She had chemotherapy only no radiation following. In 2002 she subsequently did undergo radiation for the first time and then subsequently in 2012 had repeat radiation after having had a finding that was consistent with a relapse of the cancer. 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 She also has radiation damage to the skin over the right chest wall where she had the mastectomy. She was referred to [place] by Dr. Matthew Saras in [facility] and her oncologist is Dr. Burney Gauze.  Subsequently I want to make sure before we delve into this more deeply that the patient does not have any issues with a return of cancer that needs to be managed by them. 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 Nonetheless she does not remember exactly when her last PET scan was but it was not too recently she tells me. 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 The patient does have hypertension as well. She sees her oncologist next on December 6. 09/12/2020 on evaluation today patient actually appears to be doing excellent in regard to her wound under the right breast location. Currently this is measuring smaller in general seems to be doing very well. She tells me that the collagen is doing a good job and that the dressing that she has been putting on is not causing any troubles as far pulling on her skin or scar tissue is concerned all of which is excellent news. 10/03/2020 upon evaluation today patient appears to be doing well with regard to her surgical site from her mastectomy on the right breast region. She tells me that she has had very little bleeding nothing seems to be sticking badly and in general she has been extremely pleased with where things stand today. No fevers, chills, nausea, vomiting, or diarrhea. 10/24/2020 upon evaluation today patient actually appears to be doing excellent in regard to her wound. There is just a very small area still open and to be honest there was minimal slough noted on the surface of the wound nothing that requires sharp debridement. In general I feel like she is actually doing quite well and overall I am pleased. I do think we may switch to silver alginate dressing and  also contemplating using a AandE ointment in order to help keep everything moist and the scar tissue region while the alginate  keeps it dry enough to heal 11/14/2020 upon evaluation today patient appears to be doing well at this point in regard to her wound. I do feel like that she is making good progress again this is a very difficult region over the surgical site of the right chest wall at the site of mastectomy. Nonetheless I think that we are just a very small area still open I think alginate is doing a good job helping to dry this up and I would recommend this such that we continue with this. 01/02/2021 on evaluation today patient's wound actually appears to be doing decently well. There does not appear to be any signs of active infection which is great news. She does have some slight slough buildup with biofilm on the surface of the wound mainly more biofilm. Nonetheless I was able to gently remove this with saline and gauze as well as a sterile Q-tip. She tolerated that today without complication. 01/30/2021 upon evaluation today patient appears to be doing well with regard to her wound although she still has quite a bit of trouble getting this to close I think the scar tissue and radiation damage is quite significant here unfortunately. There does not appear to be any signs of active infection which is great news. No fevers, chills, nausea, vomiting, or diarrhea. 02/27/2021 upon evaluation today patient appears to be doing excellent in regard to her wound. She has been tolerating the dressing changes without complication and in general I am extremely pleased with where things stand today. There does not appear to be any signs of infection which is great news. No fevers, chills, nausea, vomiting, or diarrhea. With that being said I do think that she still developing some slough buildup. I did discuss with her today the possibility of proceeding with HBO therapy. We have had this discussion before but she really is not quite committed to wanting to do that much and spend that much time in the chamber. She wants to still think  about it. 03/27/2021 upon evaluation today patient appears to be doing about the same in regard to her wound. She still developing a lot of slough buildup on the surface of the wound. I did try to clean that away to some degree today. She tolerated that without any pain or complication. Subsequently I am thinking we may want to switch over to [facility] see if this may do better for her. Patient is in agreement with giving that a trial. 05/01/2021 upon evaluation today patient appears to be doing about the same in regard to her wounds. She has been tolerating the dressing changes without complication. Fortunately there does not appear to be any signs of active infection at this time which is great news. No fevers, chills, nausea, vomiting, or diarrhea.. 06/05/2021 upon evaluation today patient appears to be doing pretty well currently in regard to her wound at the mastectomy site right chest wall. Overall since we switch back to the alginate she tells me things have significantly improved she is much happier with where things stand currently. Fortunately there does not appear to be any signs of active infection which is great news. No fevers, chills, nausea, vomiting, or diarrhea. 07/10/2021 upon evaluation today patient unfortunately does not appear to be doing nearly as well as what she previously was. There does not appear to be any evidence of new epithelial growth  she has a lot of necrotic tissue the base of the wound this is much more significant than what we previously noted. She also has been having increased pain and increased drainage. In general I am very concerned about this especially in light of the fact that she does have a history of breast cancer I want to ensure that were not looking at any cancerous type lesion at this point. Otherwise also think she does need some debridement we did do MolecuLight scanning today. 07/17/2021 upon evaluation today patient appears to be doing well with regard  to her wound compared to last week although she still having some erythema I am concerned in this regard. I do think that she fortunately had a negative biopsy which is great news unfortunately she did have Pseudomonas noted as a bacteria present in the culture which I think is good and need to be addressed with Levaquin. I Minna send that into the pharmacy today. We did repeat the MolecuLight screening. 07/31/2021 upon evaluation today patient's wound is actually showing signs of improvement which is great news. Fortunately there does not appear to be any signs of infection currently locally nor systemically and I am very pleased in that regard. With that being said the patient is having some issues here with continued necrotic tissue I think packing with the Dakin's moistened gauze dressing is still in the right leg ago. 08/14/2021 upon evaluation today patient appears to be doing well with regard to her wound all things considered I do not see any signs of significant infection which is great news. Fortunately there does not appear to be any evidence of infection which is also great news. In general I think that the patient is tolerating the dressing changes well. We been using a Dakin's moistened gauze. 08/28/2021 upon evaluation today patient appears to be doing well with regard to her wound. Worsening signs of definite improvement which is great news and overall very pleased with where we stand today. There is no signs of inflamed tissue or infection which is great as well and I think the Dakin's is doing a great job here. 09/11/2021 upon evaluation today patient appears to be doing well with regard to her wound all things considered. I am can perform some debridement today to clear away some of the necrotic debris with that being said overall I think that she is making decently good progress here which is great news. There does not appear to be any signs of active infection also good news. That  in fact is probably the best news in her mind. 09/25/2021 upon evaluation today patient appears to be doing decently well in regard to her wound. Overall I do not see any signs of infection and I think she is doing quite well. I do believe that we are making headway here although is very slow due to the scarring and the depth of the wound this is a significantly deep wound. 10/16/2021 upon evaluation today patient appears to be doing about the same in regard to the wound on her right chest wall. Fortunately there does not appear to be any signs of active infection locally nor systemically which is great news. With that being said this still was not cleaning up quite as quickly as I would like to see. Fortunately I think that we can definitely give the Santyl another try we tried this in the past and unfortunately did not have a good result but again I think she was infected at that  time as well which was part of the issue. Nonetheless I think currently that we will be able to go ahead and see what we can do about getting this little cleaner with the Santyl. 11/06/2021 upon evaluation today patient appears to be doing well with regard to her wound. This actually appears to be a little bit better with the Santyl I am happy in that regard. Fortunately I do not see any signs of active infection at this time. No fevers, chills, nausea, vomiting, or diarrhea. 12/04/2021 upon evaluation patient appears to be doing well with the Santyl at this point. I am very pleased with where we stand and I think that she is making good progress here. Fortunately I do not see any evidence of active infection locally or systemically at this time which is great news. No fevers, chills, nausea, vomiting, or diarrhea. Electronic Signature(s) Signed: 12/04/2021 9:56:13 AM By: Worthy Keeler PA-C Entered By: Worthy Keeler on 12/04/2021  09:56:13 -------------------------------------------------------------------------------- Physical Exam Details Patient Name: Date of Service: [name] 12/04/2021 9:30 A M Medical Record Number: [id number] Patient Account Number: [id number] Date of Birth/Sex: Treating RN: 01/25/1951 (71 y.o. [name] Primary Care Provider: [name] Other Clinician: Referring Provider: Treating Provider/Extender: [name], [name]: 49 Constitutional [name] and well-hydrated in no acute distress. Respiratory normal breathing without difficulty. Psychiatric this patient is able to make decisions and demonstrates good insight into disease process. Alert and Oriented x 3. pleasant and cooperative. Notes Upon inspection patient's wound bed actually showed some signs of pink granulation tissue this is very minimal but in the center part of the wound I am actually very pleased with what we see. Obviously I think the Annitta Needs is doing a good job this is just can take a very long time to heal to be honest. Electronic Signature(s) Signed: 12/04/2021 9:57:11 AM By: Worthy Keeler PA-C Entered By: Worthy Keeler on 12/04/2021 09:57:10 -------------------------------------------------------------------------------- Physician Orders Details Patient Name: Date of Service: [name] 12/04/2021 9:30 A M Medical Record Number: [id number] Patient Account Number: [id number] Date of Birth/Sex: Treating RN: Jul 04, 1951 (71 y.o. [name], [name] Primary Care Provider: [name] Other Clinician: Referring Provider: Treating Provider/Extender: [name], [name]: 55 Verbal / Phone Orders: No Diagnosis Coding ICD-10 Coding Code Description L98.492 Non-pressure chronic ulcer of skin of other sites with fat layer exposed L58.9 Radiodermatitis, unspecified L59.8 Other specified disorders of the skin and  subcutaneous tissue related to radiation I10 Essential (primary) hypertension Follow-up Appointments Return appointment in 1 month. Margarita Grizzle Wednesday Forestine Na in Room #6 Bathing/ Shower/ Hygiene May shower and wash wound with soap and water. - on days that dressing is changed Additional Orders / Instructions Follow Nutritious Diet Wound Treatment Wound #1 - Breast (mastectomy site) Wound Laterality: Right Cleanser: Soap and Water 1 x Per Day/30 Days Discharge Instructions: May shower and wash wound with dial antibacterial soap and water prior to dressing change. Prim Dressing: Santyl Ointment 1 x Per Day/30 Days ary Discharge Instructions: Apply nickel thick amount to wound bed as instructed Secondary Dressing: Woven Gauze Sponge, Non-Sterile 4x4 in (Generic) 1 x Per Day/30 Days Discharge Instructions: Apply over primary dressing as directed. Secondary Dressing: Woven Gauze Sponges 2x2 in (Generic) 1 x Per Day/30 Days Discharge Instructions: Apply over primary dressing, fold in corners to fill the space Secured With: 42M Medipore H Soft Cloth Surgical Tape, 2x2 (in/yd) (Generic) 1 x  Per Day/30 Days Discharge Instructions: Secure dressing with tape as directed. Electronic Signature(s) Signed: 12/04/2021 4:57:32 PM By: Worthy Keeler PA-C Signed: 12/04/2021 5:31:46 PM By: Rhae Hammock RN Entered By: Rhae Hammock on 12/04/2021 09:45:09 -------------------------------------------------------------------------------- Problem List Details Patient Name: Date of Service: [name] 12/04/2021 9:30 A M Medical Record Number: [id number] Patient Account Number: [id number] Date of Birth/Sex: Treating RN: Aug 25, 1951 (71 y.o. [name] Primary Care Provider: [name] Other Clinician: Referring Provider: Treating Provider/Extender: [name], [name]: 21 Active Problems ICD-10 Encounter Code Description Active Date  MDM Diagnosis L98.492 Non-pressure chronic ulcer of skin of other sites with fat layer exposed 08/22/2020 No Yes L58.9 Radiodermatitis, unspecified 08/22/2020 No Yes L59.8 Other specified disorders of the skin and subcutaneous tissue related to 08/22/2020 No Yes radiation I10 Essential (primary) hypertension 08/22/2020 No Yes Inactive Problems Resolved Problems Electronic Signature(s) Signed: 12/04/2021 9:35:50 AM By: Worthy Keeler PA-C Entered By: Worthy Keeler on 12/04/2021 09:35:50 -------------------------------------------------------------------------------- Progress Note Details Patient Name: Date of Service: [name] 12/04/2021 9:30 A M Medical Record Number: [id number] Patient Account Number: [id number] Date of Birth/Sex: Treating RN: 08-17-1951 (71 y.o. 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 She had a mastectomy initially in 1998. She had chemotherapy only no radiation following. In 2002 she subsequently did undergo radiation for the first time and then subsequently in 2012 had repeat radiation after having had a finding that was consistent with a relapse of the cancer. 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 She also has radiation damage to the skin over the right chest wall where she had the mastectomy. She was referred to [place] by Dr. Matthew Saras in [facility] and her oncologist is Dr. Burney Gauze. Subsequently I want to make sure before we delve into this more deeply that the  patient does not have any issues with a return of cancer that needs to be managed by them. 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 Nonetheless she does not remember exactly when her last PET scan was but it was not too recently she tells me. 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 The patient does have hypertension as well. She sees her oncologist next on December 6. 09/12/2020 on evaluation today patient actually appears to be doing excellent in regard to her wound under the right breast location. Currently this is measuring smaller in general seems to be doing very well. She tells me that the collagen is doing a good job and that the dressing that she has been putting on is not causing any troubles as far pulling on her skin or scar tissue is concerned all of which is excellent news. 10/03/2020 upon evaluation today patient appears to be doing well with regard to her surgical site from her mastectomy on the right breast region. She tells me that she has had very little bleeding nothing seems to be sticking badly and  in general she has been extremely pleased with where things stand today. No fevers, chills, nausea, vomiting, or diarrhea. 10/24/2020 upon evaluation today patient actually appears to be doing excellent in regard to her wound. There is just a very small area still open and to be honest there was minimal slough noted on the surface of the wound nothing that requires sharp debridement. In general I feel like she is actually doing quite well and overall I am pleased. I do think we may switch to silver alginate dressing and also contemplating using a AandE ointment in order to help keep everything moist and the scar tissue region while the alginate keeps it dry enough to heal 11/14/2020 upon evaluation today patient appears to be  doing well at this point in regard to her wound. I do feel like that she is making good progress again this is a very difficult region over the surgical site of the right chest wall at the site of mastectomy. Nonetheless I think that we are just a very small area still open I think alginate is doing a good job helping to dry this up and I would recommend this such that we continue with this. 01/02/2021 on evaluation today patient's wound actually appears to be doing decently well. There does not appear to be any signs of active infection which is great news. She does have some slight slough buildup with biofilm on the surface of the wound mainly more biofilm. Nonetheless I was able to gently remove this with saline and gauze as well as a sterile Q-tip. She tolerated that today without complication. 01/30/2021 upon evaluation today patient appears to be doing well with regard to her wound although she still has quite a bit of trouble getting this to close I think the scar tissue and radiation damage is quite significant here unfortunately. There does not appear to be any signs of active infection which is great news. No fevers, chills, nausea, vomiting, or diarrhea. 02/27/2021 upon evaluation today patient appears to be doing excellent in regard to her wound. She has been tolerating the dressing changes without complication and in general I am extremely pleased with where things stand today. There does not appear to be any signs of infection which is great news. No fevers, chills, nausea, vomiting, or diarrhea. With that being said I do think that she still developing some slough buildup. I did discuss with her today the possibility of proceeding with HBO therapy. We have had this discussion before but she really is not quite committed to wanting to do that much and spend that much time in the chamber. She wants to still think about it. 03/27/2021 upon evaluation today patient appears to be doing about the  same in regard to her wound. She still developing a lot of slough buildup on the surface of the wound. I did try to clean that away to some degree today. She tolerated that without any pain or complication. Subsequently I am thinking we may want to switch over to [facility] see if this may do better for her. Patient is in agreement with giving that a trial. 05/01/2021 upon evaluation today patient appears to be doing about the same in regard to her wounds. She has been tolerating the dressing changes without complication. Fortunately there does not appear to be any signs of active infection at this time which is great news. No fevers, chills, nausea, vomiting, or diarrhea.. 06/05/2021 upon evaluation today patient appears to be doing pretty well currently  in regard to her wound at the mastectomy site right chest wall. Overall since we switch back to the alginate she tells me things have significantly improved she is much happier with where things stand currently. Fortunately there does not appear to be any signs of active infection which is great news. No fevers, chills, nausea, vomiting, or diarrhea. 07/10/2021 upon evaluation today patient unfortunately does not appear to be doing nearly as well as what she previously was. There does not appear to be any evidence of new epithelial growth she has a lot of necrotic tissue the base of the wound this is much more significant than what we previously noted. She also has been having increased pain and increased drainage. In general I am very concerned about this especially in light of the fact that she does have a history of breast cancer I want to ensure that were not looking at any cancerous type lesion at this point. Otherwise also think she does need some debridement we did do MolecuLight scanning today. 07/17/2021 upon evaluation today patient appears to be doing well with regard to her wound compared to last week although she still having some erythema I  am concerned in this regard. I do think that she fortunately had a negative biopsy which is great news unfortunately she did have Pseudomonas noted as a bacteria present in the culture which I think is good and need to be addressed with Levaquin. I Minna send that into the pharmacy today. We did repeat the MolecuLight screening. 07/31/2021 upon evaluation today patient's wound is actually showing signs of improvement which is great news. Fortunately there does not appear to be any signs of infection currently locally nor systemically and I am very pleased in that regard. With that being said the patient is having some issues here with continued necrotic tissue I think packing with the Dakin's moistened gauze dressing is still in the right leg ago. 08/14/2021 upon evaluation today patient appears to be doing well with regard to her wound all things considered I do not see any signs of significant infection which is great news. Fortunately there

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
## 2021-12-19 DIAGNOSIS — M25561 Pain in right knee: Secondary | ICD-10-CM | POA: Diagnosis not present

## 2021-12-19 DIAGNOSIS — G8929 Other chronic pain: Secondary | ICD-10-CM | POA: Diagnosis not present

## 2021-12-31 DIAGNOSIS — L602 Onychogryphosis: Secondary | ICD-10-CM | POA: Diagnosis not present

## 2022-01-01 ENCOUNTER — Other Ambulatory Visit: Payer: Self-pay

## 2022-01-01 ENCOUNTER — Encounter (HOSPITAL_BASED_OUTPATIENT_CLINIC_OR_DEPARTMENT_OTHER): Payer: Medicare Other | Attending: Physician Assistant | Admitting: Physician Assistant

## 2022-01-01 DIAGNOSIS — I890 Lymphedema, not elsewhere classified: Secondary | ICD-10-CM | POA: Insufficient documentation

## 2022-01-01 DIAGNOSIS — L589 Radiodermatitis, unspecified: Secondary | ICD-10-CM | POA: Diagnosis not present

## 2022-01-01 DIAGNOSIS — I10 Essential (primary) hypertension: Secondary | ICD-10-CM | POA: Insufficient documentation

## 2022-01-01 DIAGNOSIS — L98492 Non-pressure chronic ulcer of skin of other sites with fat layer exposed: Secondary | ICD-10-CM | POA: Diagnosis not present

## 2022-01-01 DIAGNOSIS — L598 Other specified disorders of the skin and subcutaneous tissue related to radiation: Secondary | ICD-10-CM | POA: Insufficient documentation

## 2022-01-01 DIAGNOSIS — Z86718 Personal history of other venous thrombosis and embolism: Secondary | ICD-10-CM | POA: Insufficient documentation

## 2022-01-01 DIAGNOSIS — Z853 Personal history of malignant neoplasm of breast: Secondary | ICD-10-CM | POA: Diagnosis not present

## 2022-01-01 DIAGNOSIS — Z9011 Acquired absence of right breast and nipple: Secondary | ICD-10-CM | POA: Insufficient documentation

## 2022-01-01 NOTE — Progress Notes (Signed)
[name], [name] ([id number]) ?Visit Report for 01/01/2022 ?Arrival Information Details ?Patient Name: Date of Service: ?[name], [name] 01/01/2022 9:30 A M ?Medical Record Number: [id number] ?Patient Account Number: [id number] ?Date of Birth/Sex: Treating RN: ?December 12, 1950 (71 y.o. F) [name], Bobbi ?Primary Care Jahzion Brogden: [name] Other Clinician: ?Referring Zakiah Beckerman: ?Treating Rehmat Murtagh/Extender: Worthy Keeler ?[place], [name] ?Weeks in Treatment: 65 ?Visit Information History Since Last Visit ?Added or deleted any medications: No ?Patient Arrived: Ambulatory ?Any new allergies or adverse reactions: No ?Arrival Time: 09:23 ?Had a fall or experienced change in No ?Accompanied By: self ?activities of daily living that may affect ?Transfer Assistance: None ?risk of falls: ?Patient Identification Verified: Yes ?Signs or symptoms of abuse/neglect since last visito No ?Secondary Verification Process Completed: Yes ?Hospitalized since last visit: No ?Patient Requires Transmission-Based Precautions: No ?Implantable device outside of the clinic excluding No ?Patient Has Alerts: No ?cellular tissue based products placed in the center ?since last visit: ?Has Dressing in Place as Prescribed: Yes ?Pain Present Now: No ?Electronic Signature(s) ?Signed: 01/01/2022 5:40:22 PM By: [name] RN, BSN ?Entered By: [name] on 01/01/2022 09:24:18 ?-------------------------------------------------------------------------------- ?Encounter Discharge Information Details ?Patient Name: Date of Service: ?[name], [name] 01/01/2022 9:30 A M ?Medical Record Number: [id number] ?Patient Account Number: [id number] ?Date of Birth/Sex: Treating RN: ?01/03/1951 (70 y.o. [name] ?Primary Care Zahraa Bhargava: [name] Other Clinician: ?Referring Iyesha Such: ?Treating Meldon Hanzlik/Extender: Worthy Keeler ?[place], [name] ?Weeks in Treatment: 20 ?Encounter Discharge Information Items Post Procedure Vitals ?Discharge Condition:  Stable ?Temperature (F): 98.5 ?Ambulatory Status: Ambulatory ?Pulse (bpm): 73 ?Discharge Destination: Home ?Respiratory Rate (breaths/min): 20 ?Transportation: Private Auto ?Blood Pressure (mmHg): 174/72 ?Schedule Follow-up Appointment: Yes ?Clinical Summary of Care: Provided on 01/01/2022 ?Form Type Recipient ?Paper Patient Patient ?Electronic Signature(s) ?Signed: 01/01/2022 5:15:11 PM By: Lorrin Jackson ?Entered By: Lorrin Jackson on 01/01/2022 10:02:37 ?-------------------------------------------------------------------------------- ?Lower Extremity Assessment Details ?Patient Name: ?Date of Service: ?[name], [name] 01/01/2022 9:30 A M ?Medical Record Number: [id number] ?Patient Account Number: [id number] ?Date of Birth/Sex: ?Treating RN: ?[birth year] (71 y.o. F) [name], Bobbi ?Primary Care Savaya Hakes: [name] ?Other Clinician: ?Referring Letisia Schwalb: ?Treating Rodricus Candelaria/Extender: Worthy Keeler ?[place], [name] ?Weeks in Treatment: 71 ?Electronic Signature(s) ?Signed: 01/01/2022 5:40:22 PM By: [name] RN, BSN ?Entered By: [name] on 01/01/2022 09:24:53 ?-------------------------------------------------------------------------------- ?Multi-Disciplinary Care Plan Details ?Patient Name: ?Date of Service: ?[name], [name] 01/01/2022 9:30 A M ?Medical Record Number: [id number] ?Patient Account Number: [id number] ?Date of Birth/Sex: ?Treating RN: ?06-Mar-1951 (71 y.o. [name] ?Primary Care Xavier Fournier: [name] ?Other Clinician: ?Referring Latika Kronick: ?Treating Shondell Poulson/Extender: Worthy Keeler ?[place], [name] ?Weeks in Treatment: 67 ?Multidisciplinary Care Plan reviewed with physician ?Active Inactive ?Necrotic Tissue ?Nursing Diagnoses: ?Knowledge deficit related to management of necrotic/devitalized tissue ?Goals: ?Necrotic/devitalized tissue will be minimized in the wound bed ?Date Initiated: 07/10/2021 ?Target Resolution Date: 02/05/2022 ?Goal Status: Active ?Interventions: ?Provide education  on necrotic tissue and debridement process ?Treatment Activities: ?Excisional debridement : 07/10/2021 ?Notes: ?01/01/22: Continues to use Santyl ?Wound/Skin Impairment ?Nursing Diagnoses: ?Impaired tissue integrity ?Knowledge deficit related to ulceration/compromised skin integrity ?Goals: ?Patient/caregiver will verbalize understanding of skin care regimen ?Date Initiated: 08/22/2020 ?Target Resolution Date: 02/05/2022 ?Goal Status: Active ?Ulcer/skin breakdown will have a volume reduction of 30% by week 4 ?Date Initiated: 08/22/2020 ?Date Inactivated: 10/03/2020 ?Target Resolution Date: 09/19/2020 ?Goal Status: Met ?Ulcer/skin breakdown will have a volume reduction of 50% by week 8 ?Date Initiated: 06/05/2021 ?Date Inactivated: 07/17/2021 ?Target Resolution Date: 07/03/2021 ?Goal Status: Unmet ?Unmet Reason: infection ?Interventions: ?Assess patient/caregiver ability to  obtain necessary supplies ?Assess patient/caregiver ability to perform ulcer/skin care regimen upon admission and as needed ?Assess ulceration(s) every visit ?Treatment Activities: ?Skin care regimen initiated : 08/22/2020 ?Topical wound management initiated : 08/22/2020 ?Notes: ?06/05/21: Wound care regimen ongoing. ?Electronic Signature(s) ?Signed: 01/01/2022 5:15:11 PM By: Lorrin Jackson ?Entered By: Lorrin Jackson on 01/01/2022 09:48:37 ?-------------------------------------------------------------------------------- ?Pain Assessment Details ?Patient Name: ?Date of Service: ?[name], [name] 01/01/2022 9:30 A M ?Medical Record Number: [id number] ?Patient Account Number: [id number] ?Date of Birth/Sex: ?Treating RN: ?14-Feb-1951 (71 y.o. F) [name], Bobbi ?Primary Care Ileta Ofarrell: [name] ?Other Clinician: ?Referring Kathy Wares: ?Treating Nik Gorrell/Extender: Worthy Keeler ?[place], [name] ?Weeks in Treatment: 58 ?Active Problems ?Location of Pain Severity and Description of Pain ?Patient Has Paino No ?Site Locations ?Rate the pain. ?Current Pain Level:  0 ?Pain Management and Medication ?Current Pain Management: ?Medication: No ?Cold Application: No ?Rest: No ?Massage: No ?Activity: No ?T.E.N.S.: No ?Heat Application: No ?Leg drop or elevation: No ?Is the Current Pain Management Adequate: Adequate ?How does your wound impact your activities of daily livingo ?Sleep: No ?Bathing: No ?Appetite: No ?Relationship With Others: No ?Bladder Continence: No ?Emotions: No ?Bowel Continence: No ?Work: No ?Toileting: No ?Drive: No ?Dressing: No ?Hobbies: No ?Electronic Signature(s) ?Signed: 01/01/2022 5:40:22 PM By: [name] RN, BSN ?Entered By: [name] on 01/01/2022 09:24:48 ?-------------------------------------------------------------------------------- ?Patient/Caregiver Education Details ?Patient Name: ?Date of Service: ?[name], [name] 3/22/2023andnbsp9:30 A M ?Medical Record Number: [id number] ?Patient Account Number: [id number] ?Date of Birth/Gender: ?Treating RN: ?[birth year] (71 y.o. [name] ?Primary Care Physician: [name] ?Other Clinician: ?Referring Physician: ?Treating Physician/Extender: Worthy Keeler ?[place], [name] ?Weeks in Treatment: 79 ?Education Assessment ?Education Provided To: ?Patient ?Education Topics Provided ?Wound Debridement: ?Methods: Explain/Verbal, Printed ?Responses: State content correctly ?Wound/Skin Impairment: ?Methods: Explain/Verbal, Printed ?Responses: State content correctly ?Electronic Signature(s) ?Signed: 01/01/2022 5:15:11 PM By: Lorrin Jackson ?Entered By: Lorrin Jackson on 01/01/2022 09:49:01 ?-------------------------------------------------------------------------------- ?Wound Assessment Details ?Patient Name: ?Date of Service: ?[name], [name] 01/01/2022 9:30 A M ?Medical Record Number: [id number] ?Patient Account Number: [id number] ?Date of Birth/Sex: ?Treating RN: ?08-Jun-1951 (71 y.o. F) [name], Bobbi ?Primary Care Camelle Henkels: [name] ?Other Clinician: ?Referring Meha Vidrine: ?Treating  Charod Slawinski/Extender: Worthy Keeler ?[place], [name] ?Weeks in Treatment: 38 ?Wound Status ?Wound Number: 1 Primary Soft Tissue Radionecrosis ?Etiology: ?Wound Location: Right Breast (mastectomy site) ?Wound Open ?Wounding Ev

## 2022-01-01 NOTE — Progress Notes (Addendum)
GENNETTE, SHADIX ([id number]) ?Visit Report for 01/01/2022 ?Chief Complaint Document Details ?Patient Name: Date of Service: ?[name], [name] 01/01/2022 9:30 A M ?Medical Record Number: [id number] ?Patient Account Number: [id number] ?Date of Birth/Sex: Treating RN: ?09-Dec-1950 (71 y.o. 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 [name] ?Primary Care Provider: [name] Other Clinician: ?Referring Provider: ?Treating Provider/Extender: Worthy Keeler ?[place], [name] ?Weeks in Treatment: 47 ?Debridement Performed for Assessment: Wound #1 Right Breast (mastectomy site) ?Performed By: Physician Worthy Keeler, PA ?Debridement Type: Chemical/Enzymatic/Mechanical ?Agent Used: Santyl ?Level of Consciousness (Pre-procedure): Awake and Alert ?Pre-procedure Verification/Time Out Yes - 09:45 ?Taken: ?Start Time: 09:46 ?Bleeding: None ?End Time: 09:48 ?Response to Treatment: Procedure was tolerated well ?Level of Consciousness (Post- Awake and Alert ?procedure): ?Post Debridement Measurements of Total Wound ?Length: (cm) 1.8 ?Width: (cm) 3.2 ?Depth: (cm) 0.9 ?Volume: (cm?) 4.072 ?Character of Wound/Ulcer Post Debridement: Stable ?Post Procedure Diagnosis ?Same as Pre-procedure ?Electronic Signature(s) ?Signed: 01/01/2022 5:15:11 PM By: Lorrin Jackson ?Signed:  01/01/2022 5:24:10 PM By: Worthy Keeler PA-C ?Entered By: Lorrin Jackson on 01/01/2022 09:51:29 ?-------------------------------------------------------------------------------- ?HPI Details ?Patient Name: Date of Service: ?[name], [name] 01/01/2022 9:30 A M ?Medical Record Number: [id number] ?Patient Account Number: [id number] ?Date of Birth/Sex: Treating RN: ?07-27-1951 (71 y.o. F) ?Primary Care Provider: [name] Other Clinician: ?Referring Provider: ?Treating Provider/Extender: Worthy Keeler ?[place], [name] ?Weeks in Treatment: 92 ?History of Present Illness ?HPI Description: 08/22/2020 upon evaluation today patient actually appears to be doing somewhat poorly in regard to her mastectomy site on the right chest ?wall. She had a mastectomy initially in 1998. She had chemotherapy only no radiation following. In 2002 she subsequently did undergo radiation for the first ?time and then subsequently in 2012 had repeat radiation after having had a finding that was consistent with a relapse of the cancer. 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 She also has radiation damage to the skin over the right chest wall where she had the ?mastectomy. She was referred to [place] by Dr. Matthew Saras in [facility] and her oncologist is Dr. Burney Gauze. Subsequently I want to make sure before ?we delve into this more deeply that the patient does not have any issues with a return of cancer that needs to be managed by them. Obviously she does have ?significant scar tissue which may be benefited secondary to the soft tissue radionecrosis by hyperbaric oxygen therapy in the absence of any recurrence of ?the cancer. Nonetheless she does not remember exactly when her last PET scan was but it was not too recently she tells me. She does have a history of a ?DVT in the right leg in 2013. The patient does have hypertension as well. She sees her oncologist next on December  6. ?09/12/2020 on evaluation today patient actually appears to be doing excellent in regard to her wound under the right breast location. Currently this is measuring ?smaller in general seems to be doing very well. She tells me that the collagen is doing  a good job and that the dressing that she has been putting on is not ?causing any troubles as far pulling on her skin or scar tissue is concerned all of which is excellent news. ?10/03/2020 upon evaluation today patient appears to be doing well with regard to her surgical site from her mastectomy on the right breast region. She tells me ?that she has had very little bleeding nothing seems to be sticking badly and in general she has been extremely pleased with where things stand today. No ?fevers, chills, nausea, vomiting, or diarrhea. ?10/24/2020 upon evaluation today patient actually appears to be doing excellent in regard to her wound. There is just a very small area still open and to be ?honest there was minimal slough noted on the surface of the wound nothing that requires sharp debridement. In general I feel like she is actually doing quite ?well and overall I am pleased. I do think we may switch to silver alginate dressing and also contemplating using a AandE ointment in order to help keep ?everything moist and the scar tissue region while the alginate keeps it dry enough to heal ?11/14/2020 upon evaluation today patient appears to be doing well at this point in regard to her wound. I do feel like that she is making good progress again this is ?a very difficult region over the surgical site of the right chest wall at the site of mastectomy. Nonetheless I think that we are just a very small area still open I ?think alginate is doing a good job helping to dry this up and I would recommend this such that we continue with this. ?01/02/2021 on evaluation today patient's wound actually appears to be doing decently well. There does not appear to be any signs of active  infection which is ?great news. She does have some slight slough buildup with biofilm on the surface of the wound mainly more biofilm. Nonetheless I was able to gently remove ?this with saline and gauze as well as a sterile Q-tip. She tolerated that today without complication. ?01/30/2021 upon evaluation today patient appears to be doing well with regard to her wound although she still has quite a bit of trouble getting this to close I think ?the scar tissue and radiation damage is quite significant here unfortunately. There does not appear to be any signs of active infection which is great news. No ?fevers, chills, nausea, vomiting, or diarrhea. ?02/27/2021 upon evaluation today patient appears to be doing excellent in regard to her wound. She has been tolerating the dressing changes without ?complication and in general I am extremely pleased with where things stand today. There does not appear to be any signs of infection which is great news. No ?fevers, chills, nausea, vomiting, or diarrhea. With that being said I do think that she still developing some slough buildup. I did discuss with her today the ?possibility of proceeding with HBO therapy. We have had this discussion before but she really is not quite committed to wanting to do that much and spend ?that much time in the chamber. She wants to still think about it. ?03/27/2021 upon evaluation today patient appears to be doing about the same in regard to her wound. She still developing a lot of slough buildup on the surface ?of the wound. I did try to clean that away to some degree today. She tolerated that without any pain or complication. Subsequently I am thinking we may want ?to switch over to [facility] see if this may do better  for her. Patient is in agreement with giving that a trial. ?05/01/2021 upon evaluation today patient appears to be doing about the same in regard to her wounds. She has been tolerating the dressing changes without ?complication.  Fortunately there does not appear to be any signs of active infection at this time which is great news. No fevers, chills, nausea, vomiting, or ?diarrhea.Marland Kitchen ?06/05/2021 upon evaluation today patient appears to be doing pretty well

## 2022-01-02 ENCOUNTER — Inpatient Hospital Stay (HOSPITAL_BASED_OUTPATIENT_CLINIC_OR_DEPARTMENT_OTHER): Payer: Medicare Other | Admitting: Hematology & Oncology

## 2022-01-02 ENCOUNTER — Encounter: Payer: Self-pay | Admitting: Hematology & Oncology

## 2022-01-02 ENCOUNTER — Other Ambulatory Visit: Payer: Self-pay

## 2022-01-02 ENCOUNTER — Telehealth: Payer: Self-pay | Admitting: *Deleted

## 2022-01-02 ENCOUNTER — Inpatient Hospital Stay: Payer: Medicare Other | Attending: Hematology & Oncology

## 2022-01-02 VITALS — BP 136/58 | HR 73 | Temp 98.2°F | Resp 18 | Ht 64.17 in | Wt 180.4 lb

## 2022-01-02 DIAGNOSIS — Z923 Personal history of irradiation: Secondary | ICD-10-CM | POA: Insufficient documentation

## 2022-01-02 DIAGNOSIS — D638 Anemia in other chronic diseases classified elsewhere: Secondary | ICD-10-CM | POA: Diagnosis not present

## 2022-01-02 DIAGNOSIS — D509 Iron deficiency anemia, unspecified: Secondary | ICD-10-CM | POA: Diagnosis not present

## 2022-01-02 DIAGNOSIS — C50011 Malignant neoplasm of nipple and areola, right female breast: Secondary | ICD-10-CM | POA: Diagnosis not present

## 2022-01-02 DIAGNOSIS — Z7982 Long term (current) use of aspirin: Secondary | ICD-10-CM | POA: Insufficient documentation

## 2022-01-02 DIAGNOSIS — M818 Other osteoporosis without current pathological fracture: Secondary | ICD-10-CM | POA: Insufficient documentation

## 2022-01-02 DIAGNOSIS — C50911 Malignant neoplasm of unspecified site of right female breast: Secondary | ICD-10-CM | POA: Insufficient documentation

## 2022-01-02 DIAGNOSIS — Z79811 Long term (current) use of aromatase inhibitors: Secondary | ICD-10-CM | POA: Insufficient documentation

## 2022-01-02 DIAGNOSIS — D500 Iron deficiency anemia secondary to blood loss (chronic): Secondary | ICD-10-CM

## 2022-01-02 LAB — IRON AND IRON BINDING CAPACITY (CC-WL,HP ONLY)
Iron: 48 ug/dL (ref 28–170)
Saturation Ratios: 19 % (ref 10.4–31.8)
TIBC: 252 ug/dL (ref 250–450)
UIBC: 204 ug/dL (ref 148–442)

## 2022-01-02 LAB — RETICULOCYTES
Immature Retic Fract: 8.1 % (ref 2.3–15.9)
RBC.: 3.56 MIL/uL — ABNORMAL LOW (ref 3.87–5.11)
Retic Count, Absolute: 54.5 10*3/uL (ref 19.0–186.0)
Retic Ct Pct: 1.5 % (ref 0.4–3.1)

## 2022-01-02 LAB — CBC WITH DIFFERENTIAL (CANCER CENTER ONLY)
Abs Immature Granulocytes: 0.03 10*3/uL (ref 0.00–0.07)
Basophils Absolute: 0 10*3/uL (ref 0.0–0.1)
Basophils Relative: 1 %
Eosinophils Absolute: 0.2 10*3/uL (ref 0.0–0.5)
Eosinophils Relative: 3 %
HCT: 30.8 % — ABNORMAL LOW (ref 36.0–46.0)
Hemoglobin: 9.5 g/dL — ABNORMAL LOW (ref 12.0–15.0)
Immature Granulocytes: 1 %
Lymphocytes Relative: 30 %
Lymphs Abs: 2 10*3/uL (ref 0.7–4.0)
MCH: 26.6 pg (ref 26.0–34.0)
MCHC: 30.8 g/dL (ref 30.0–36.0)
MCV: 86.3 fL (ref 80.0–100.0)
Monocytes Absolute: 0.5 10*3/uL (ref 0.1–1.0)
Monocytes Relative: 7 %
Neutro Abs: 3.9 10*3/uL (ref 1.7–7.7)
Neutrophils Relative %: 58 %
Platelet Count: 201 10*3/uL (ref 150–400)
RBC: 3.57 MIL/uL — ABNORMAL LOW (ref 3.87–5.11)
RDW: 16 % — ABNORMAL HIGH (ref 11.5–15.5)
WBC Count: 6.6 10*3/uL (ref 4.0–10.5)
nRBC: 0 % (ref 0.0–0.2)

## 2022-01-02 LAB — CMP (CANCER CENTER ONLY)
ALT: 11 U/L (ref 0–44)
AST: 11 U/L — ABNORMAL LOW (ref 15–41)
Albumin: 3.8 g/dL (ref 3.5–5.0)
Alkaline Phosphatase: 66 U/L (ref 38–126)
Anion gap: 7 (ref 5–15)
BUN: 30 mg/dL — ABNORMAL HIGH (ref 8–23)
CO2: 27 mmol/L (ref 22–32)
Calcium: 9.3 mg/dL (ref 8.9–10.3)
Chloride: 108 mmol/L (ref 98–111)
Creatinine: 0.67 mg/dL (ref 0.44–1.00)
GFR, Estimated: >60 mL/min (ref >60)
Glucose, Bld: 118 mg/dL — ABNORMAL HIGH (ref 70–99)
Potassium: 4.1 mmol/L (ref 3.5–5.1)
Sodium: 142 mmol/L (ref 135–145)
Total Bilirubin: 0.5 mg/dL (ref 0.3–1.2)
Total Protein: 7.3 g/dL (ref 6.5–8.1)

## 2022-01-02 LAB — FERRITIN: Ferritin: 643 ng/mL — ABNORMAL HIGH (ref 11–307)

## 2022-01-02 NOTE — Progress Notes (Signed)
?Hematology and Oncology Follow Up Visit ? ?[name] ?[id number] ?[birth year] 71 y.o. ?01/02/2022 ? ? ?Principle Diagnosis:  ?Locally recurrent adenocarcinoma of the right breast ?History of superficial venous thrombus of the right thigh ?Iron deficiency anemia ?  ?Current Therapy:        ?1. Aromasin 25 mg p.o. daily - on hold -- start on 03/18/2021 -- hold on 01/02/1022 ?2. Aspirin 81 mg p.o. daily ?3.  Feraheme-510 mg IV as needed.  Last dose given on 08/23/2021  ? ?Interim History:  [name] is here today for follow-up.  She just is not doing all that well.  She is really bothered by the arthritis.  It seems to be getting worse on her.  There is more exacerbations of this. ? ?I told her to stop the Aromasin again.  I do not know if this might be contributing to the worsening of her arthritic pain. ? ?I think what is a real problem is the anemia.  Her hemoglobin is only 9.5.  She got iron back in November.  I am just surprised that her hemoglobin is not improved.  We are checking her erythropoietin level.  Her reticulocyte count, when corrected, is about 1%.  This is incredibly low.  I think she may need to have Aranesp if she has a low erythropoietin level. ? ?Her quality of life just is not that great right now.  A lot of this is being driven by the arthritis. ? ?She has had no problems with bowels or bladder.  She has had no cough or shortness of breath.  There is been no issues with COVID. ? ?She has had no rashes.  There is been no leg swelling. ? 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  She sees the [facility] for this.   ? ?Overall, I would have said that her performance status is probably ECOG 1-2.   ? ?Medications:  ?Allergies as of 01/02/2022   ? ?   Reactions  ? Contrast Media [iodinated Contrast Media] Hives, Shortness Of Breath, Nausea And Vomiting  ? Allergic reaction even after pre-meds.  ? Metrizamide Hives, Shortness Of Breath, Nausea And Vomiting  ?  Allergic reaction even after pre-meds.  ? Other Shortness Of Breath, Nausea And Vomiting, Rash  ? Allergic reaction even after pre-meds.  ? Dye Fdc Blue [brilliant Blue Fcf (fd&c Blue #1)] Hives, Nausea Only  ? Fd&c Blue #1 (brilliant Blue Fcf) Hives, Nausea Only  ? ?  ? ?  ?Medication List  ?  ? ?  ? Accurate as of January 02, 2022 10:59 AM. If you have any questions, ask your nurse or doctor.  ?  ?  ? ?  ? ?STOP taking these medications   ? ?meloxicam 7.5 MG tablet ?Commonly known as: MOBIC ?Stopped by: [name], MD ?  ?methylPREDNISolone 4 MG Tbpk tablet ?Commonly known as: MEDROL DOSEPAK ?Stopped by: [name], MD ?  ?traMADol 50 MG tablet ?Commonly known as: ULTRAM ?Stopped by: [name], MD ?  ? ?  ? ?TAKE these medications   ? ?amLODipine 10 MG tablet ?Commonly known as: NORVASC ?TAKE 1 TABLET BY MOUTH DAILY ?  ?aspirin EC 81 MG tablet ?Take 81 mg by mouth daily. ?  ?candesartan 32 MG tablet ?Commonly known as: ATACAND ?Take 32 mg by mouth daily. ?  ?celecoxib 200 MG capsule ?Commonly known as: CELEBREX ?Take 200 mg by mouth daily as needed. ?  ?  exemestane 25 MG tablet ?Commonly known as: AROMASIN ?TAKE 1 TABLET BY MOUTH EVERY DAY ?  ?hydrochlorothiazide 25 MG tablet ?Commonly known as: HYDRODIURIL ?Take 25 mg by mouth daily. ?  ?ibuprofen 200 MG tablet ?Commonly known as: ADVIL ?Take 200 mg by mouth every 6 (six) hours as needed. ?  ?metoprolol succinate 50 MG 24 hr tablet ?Commonly known as: TOPROL-XL ?Take 50 mg by mouth daily. ?  ?Santyl 250 UNIT/GM ointment ?Generic drug: collagenase ?Apply topically. ?  ?simvastatin 20 MG tablet ?Commonly known as: ZOCOR ?Take 20 mg by mouth daily. ?  ?Vitamin D (Ergocalciferol) 1.25 MG (50000 UNIT) Caps capsule ?Commonly known as: DRISDOL ?TAKE 1 CAPSULE BY MOUTH ONE TIME PER WEEK ?  ? ?  ? ? ?Allergies:  ?Allergies  ?Allergen Reactions  ? Contrast Media [Iodinated Contrast Media] Hives, Shortness Of Breath and Nausea And Vomiting  ?  Allergic reaction  even after pre-meds.  ? Metrizamide Hives, Shortness Of Breath and Nausea And Vomiting  ?  Allergic reaction even after pre-meds.  ? Other Shortness Of Breath, Nausea And Vomiting and Rash  ?  Allergic reaction even after pre-meds.  ? Dye Fdc Blue [Brilliant Blue Fcf (Fd&C Blue #1)] Hives and Nausea Only  ? Fd&C Blue #1 (Brilliant Blue Fcf) Hives and Nausea Only  ? ? ?Past Medical History, Surgical history, Social history, and Family History were reviewed and updated. ? ?Review of Systems: ?Review of Systems  ?Constitutional: Negative.   ?HENT:  Positive for hearing loss.   ?Eyes:  Positive for discharge.  ?Respiratory: Negative.    ?Cardiovascular: Negative.   ?Gastrointestinal: Negative.   ?Genitourinary: Negative.   ?Musculoskeletal: Negative.   ?Skin: Negative.   ?Neurological: Negative.   ?Endo/Heme/Allergies: Negative.   ?Psychiatric/Behavioral: Negative.    ? ? ?Physical Exam: ? height is 5' 4.17" (1.63 m) and weight is 180 lb 6.4 oz (81.8 kg). Her oral temperature is 98.2 ?F (36.8 ?C). Her blood pressure is 136/58 (abnormal) and her pulse is 73. Her respiration is 18 and oxygen saturation is 100%.  ? ?Wt Readings from Last 3 Encounters:  ?01/02/22 180 lb 6.4 oz (81.8 kg)  ?08/13/21 187 lb 8 oz (85 kg)  ?06/25/21 185 lb (83.9 kg)  ? ?Physical Exam ?Vitals reviewed.  ?Constitutional:   ?   Comments: On her breast exam, her left breast is without any masses, edema or erythema.  There is no left axillary adenopathy.  There is no discharge from the nipple on the left breast.  Right chest wall shows radiation dermatitis which is about the same.  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  There is no obvious exudate.  There is no tenderness.  There is no right axillary adenopathy.  ?HENT:  ?   Head: Normocephalic and atraumatic.  ?Eyes:  ?   Pupils: Pupils are equal, round, and reactive to light.  ?   Comments: Her ocular exam does show this stye on the upper eyelid of the left eye.  There is an  area of purulent material that looks like he might be coming out soon.  She has good extraocular muscle movement.  Pupils react appropriately.  ?Cardiovascular:  ?   Rate and Rhythm: Normal rate and regular rhythm.  ?   Heart sounds: Normal heart sounds.  ?Pulmonary:  ?   Effort: Pulmonary effort is normal.  ?   Breath sounds: Normal breath sounds.  ?Abdominal:  ?   General: Bowel sounds are normal.  ?  Palpations: Abdomen is soft.  ?Musculoskeletal:     ?   General: No tenderness or deformity. Normal range of motion.  ?   Cervical back: Normal range of motion.  ?Lymphadenopathy:  ?   Cervical: No cervical adenopathy.  ?Skin: ?   General: Skin is warm and dry.  ?   Findings: No erythema or rash.  ?Neurological:  ?   Mental Status: She is alert and oriented to person, place, and time.  ?Psychiatric:     ?   Behavior: Behavior normal.     ?   Thought Content: Thought content normal.     ?   Judgment: Judgment normal.  ? ? ? ? ?Lab Results  ?Component Value Date  ? WBC 6.6 01/02/2022  ? HGB 9.5 (L) 01/02/2022  ? HCT 30.8 (L) 01/02/2022  ? MCV 86.3 01/02/2022  ? PLT 201 01/02/2022  ? ?Lab Results  ?Component Value Date  ? FERRITIN 516 (H) 08/13/2021  ? IRON 32 (L) 08/13/2021  ? TIBC 201 (L) 08/13/2021  ? UIBC 169 08/13/2021  ? IRONPCTSAT 16 (L) 08/13/2021  ? ?Lab Results  ?Component Value Date  ? RETICCTPCT 1.5 01/02/2022  ? RBC 3.57 (L) 01/02/2022  ? RBC 3.56 (L) 01/02/2022  ? RETICCTABS 55.2 06/20/2011  ? ?No results found for: KPAFRELGTCHN, LAMBDASER, KAPLAMBRATIO ?No results found for: IGGSERUM, IGA, IGMSERUM ?Lab Results  ?Component Value Date  ? TOTALPROTELP 6.6 08/14/2010  ? ALBUMINELP 53.8 (L) 08/14/2010  ? A1GS 5.3 (H) 08/14/2010  ? A2GS 14.3 (H) 08/14/2010  ? BETS 5.8 08/14/2010  ? BETA2SER 6.3 08/14/2010  ? GAMS 14.5 08/14/2010  ? MSPIKE NOT DET 08/14/2010  ? SPEI * 08/14/2010  ? ?  Chemistry   ?   ?Component Value Date/Time  ? NA 142 01/02/2022 1011  ? NA 146 (H) 07/23/2017 0900  ? NA 144 02/18/2017 0901  ?  K 4.1 01/02/2022 1011  ? K 3.7 07/23/2017 0900  ? K 3.7 02/18/2017 0901  ? CL 108 01/02/2022 1011  ? CL 107 07/23/2017 0900  ? CO2 27 01/02/2022 1011  ? CO2 31 07/23/2017 0900  ? CO2 27 02/18/2017 0

## 2022-01-02 NOTE — Telephone Encounter (Signed)
As noted below by Dr. Marin Olp, I informed the patient that the iron level is not to bad. He wants to see what the EPO level is. You may need a booster injection, Aranesp, to help bring up the level. She verbalized understanding. ?

## 2022-01-02 NOTE — Telephone Encounter (Signed)
-----   Message from [name], MD sent at 01/02/2022  2:15 PM EDT ----- ?Please call let her know that the iron is actually not too bad.  We will have to see what her EPO level is.  She may need to have booster shots.  pete ?

## 2022-01-02 NOTE — Telephone Encounter (Signed)
Per 01/02/22 los - gave upcoming appointments - confirmed ?

## 2022-01-03 LAB — ERYTHROPOIETIN: Erythropoietin: 11.1 m[IU]/mL (ref 2.6–18.5)

## 2022-01-29 ENCOUNTER — Encounter (HOSPITAL_BASED_OUTPATIENT_CLINIC_OR_DEPARTMENT_OTHER): Payer: Medicare Other | Attending: Physician Assistant | Admitting: Physician Assistant

## 2022-01-29 DIAGNOSIS — Z853 Personal history of malignant neoplasm of breast: Secondary | ICD-10-CM | POA: Insufficient documentation

## 2022-01-29 DIAGNOSIS — Z86718 Personal history of other venous thrombosis and embolism: Secondary | ICD-10-CM | POA: Insufficient documentation

## 2022-01-29 DIAGNOSIS — I10 Essential (primary) hypertension: Secondary | ICD-10-CM | POA: Diagnosis not present

## 2022-01-29 DIAGNOSIS — I890 Lymphedema, not elsewhere classified: Secondary | ICD-10-CM | POA: Insufficient documentation

## 2022-01-29 DIAGNOSIS — L598 Other specified disorders of the skin and subcutaneous tissue related to radiation: Secondary | ICD-10-CM | POA: Insufficient documentation

## 2022-01-29 DIAGNOSIS — Z9221 Personal history of antineoplastic chemotherapy: Secondary | ICD-10-CM | POA: Insufficient documentation

## 2022-01-29 DIAGNOSIS — Z9011 Acquired absence of right breast and nipple: Secondary | ICD-10-CM | POA: Diagnosis not present

## 2022-01-29 DIAGNOSIS — L98492 Non-pressure chronic ulcer of skin of other sites with fat layer exposed: Secondary | ICD-10-CM | POA: Insufficient documentation

## 2022-01-29 DIAGNOSIS — L589 Radiodermatitis, unspecified: Secondary | ICD-10-CM | POA: Insufficient documentation

## 2022-01-29 NOTE — Progress Notes (Addendum)
BERNADEAN, SALING ([id number]) ?Visit Report for 01/29/2022 ?Chief Complaint Document Details ?Patient Name: Date of Service: ?[name], [name] 01/29/2022 9:30 A M ?Medical Record Number: [id number] ?Patient Account Number: [id number] ?Date of Birth/Sex: Treating RN: ?Sep 15, 1951 (71 y.o. 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 [name] ?Primary Care Provider: [name] Other Clinician: ?Referring Provider: ?Treating Provider/Extender: Worthy Keeler ?[place], [name] ?Weeks in Treatment: 61 ?History of Present Illness ?HPI Description: 08/22/2020 upon evaluation today patient actually appears to be doing somewhat poorly in regard to her mastectomy site on the right chest ?wall. She had a mastectomy initially in 1998. She had chemotherapy only no radiation following. In 2002 she subsequently did undergo radiation for the first ?time and then subsequently in 2012 had repeat radiation after having had a finding that was consistent with a relapse of the cancer. 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 She also has radiation damage to the skin over the right chest wall where she had the ?mastectomy. She was referred to [place] by Dr. Matthew Saras in [facility] and her oncologist is Dr. Burney Gauze.  Subsequently I want to make sure before ?we delve into this more deeply that the patient does not have any issues with a return of cancer that needs to be managed by them. Obviously she does have ?significant scar tissue which may be benefited secondary to the soft tissue radionecrosis by hyperbaric oxygen therapy in the absence of any recurrence of ?the cancer. Nonetheless she does not remember exactly when her last PET scan was but it was not too recently she tells me. She does have a history of a ?DVT in the right leg in 2013. The patient does have hypertension as well. She sees her oncologist next on December 6. ?09/12/2020 on evaluation today patient actually appears to be doing excellent in regard to her wound under the right breast location. Currently this is measuring ?smaller in general seems to be doing very well. She tells me that the collagen is doing a good job and that the dressing that she has been putting on is not ?causing any troubles as far pulling on her skin or scar tissue is concerned all of which is excellent news. ?10/03/2020 upon evaluation today patient appears to be doing well with regard to her surgical site from her mastectomy on the right breast region. She tells me ?that she has had very little bleeding nothing seems to be sticking badly and in general she has been extremely pleased with where things stand today. No ?fevers, chills, nausea, vomiting, or diarrhea. ?10/24/2020 upon evaluation today patient actually appears to be doing excellent in regard to her wound. There is just a very small area still open and to be ?honest there was minimal slough noted on the surface of the wound nothing that requires sharp debridement. In general I feel like she is actually doing quite ?well and overall I am pleased. I do think we may switch to silver alginate dressing and  also contemplating using a AandE ointment in order to help keep ?everything moist and the scar tissue region while the alginate  keeps it dry enough to heal ?11/14/2020 upon evaluation today patient appears to be doing well at this point in regard to her wound. I do feel like that she is making good progress again this is ?a very difficult region over the surgical site of the right chest wall at the site of mastectomy. Nonetheless I think that we are just a very small area still open I ?think alginate is doing a good job helping to dry this up and I would recommend this such that we continue with this. ?01/02/2021 on evaluation today patient's wound actually appears to be doing decently well. There does not appear to be any signs of active infection which is ?great news. She does have some slight slough buildup with biofilm on the surface of the wound mainly more biofilm. Nonetheless I was able to gently remove ?this with saline and gauze as well as a sterile Q-tip. She tolerated that today without complication. ?01/30/2021 upon evaluation today patient appears to be doing well with regard to her wound although she still has quite a bit of trouble getting this to close I think ?the scar tissue and radiation damage is quite significant here unfortunately. There does not appear to be any signs of active infection which is great news. No ?fevers, chills, nausea, vomiting, or diarrhea. ?02/27/2021 upon evaluation today patient appears to be doing excellent in regard to her wound. She has been tolerating the dressing changes without ?complication and in general I am extremely pleased with where things stand today. There does not appear to be any signs of infection which is great news. No ?fevers, chills, nausea, vomiting, or diarrhea. With that being said I do think that she still developing some slough buildup. I did discuss with her today the ?possibility of proceeding with HBO therapy. We have had this discussion before but she really is not quite committed to wanting to do that much and spend ?that much time in the chamber. She wants to still think  about it. ?03/27/2021 upon evaluation today patient appears to be doing about the same in regard to her wound. She still developing a lot of slough buildup on the surface ?of the wound. I did try to clean that away to some degree today. She tolerated that without any pain or complication. Subsequently I am thinking we may want ?to switch over to [facility] see if this may do better for her. Patient is in agreement with giving that a trial. ?05/01/2021 upon evaluation today patient appears to be doing about the same in regard to her wounds. She has been tolerating the dressing changes without ?complication. Fortunately there does not appear to be any signs of active infection at this time which is great news. No fevers, chills, nausea, vomiting, or ?diarrhea.Marland Kitchen ?06/05/2021 upon evaluation today patient appears to be doing pretty well currently in regard to her wound at the mastectomy site right chest wall. Overall since ?we switch back to the alginate she tells me things have significantly improved she is much happier with where things stand currently. Fortunately there does ?not appear to be any signs of active infection which is great news. No fevers, chills, nausea, vomiting, or diarrhea. ?07/10/2021 upon evaluation today patient unfortunately does not appear to be doing nearly as well as what she previously was. There does not appear to be any ?evidence of new epithelial growth  she has a lot of necrotic tissue the base of the wound this is much more significant than what we previously noted. She also ?has been having increased pain and increased drainage. In general I am very concerned about this especially in light of the fact that she does have a history ?of breast cancer I want to ensure that were not looking at any cancerous type lesion at this point. Otherwise also think she does need some debridement we did ?do [employer] scanning today. ?07/17/2021 upon evaluation today patient appears to be doing well with regard  to her wound compared to last week although she still having some erythema I am ?concerned in this regard. I do think that she fortunately had a negative biopsy which is great news unfortunately she did ha

## 2022-01-29 NOTE — Progress Notes (Signed)
[name], [name] ([id number]) ?Visit Report for 01/29/2022 ?Arrival Information Details ?Patient Name: Date of Service: ?[name], [name] 01/29/2022 9:30 A M ?Medical Record Number: [id number] ?Patient Account Number: [id number] ?Date of Birth/Sex: Treating RN: ?05-28-1951 (71 y.o. [name] ?Primary Care Jerline Linzy: [name] Other Clinician: ?Referring Seng Larch: ?Treating Virgle Arth/Extender: Worthy Keeler ?[place], [name] ?Weeks in Treatment: 77 ?Visit Information History Since Last Visit ?Added or deleted any medications: No ?Patient Arrived: Ambulatory ?Any new allergies or adverse reactions: No ?Arrival Time: 09:23 ?Had a fall or experienced change in No ?Transfer Assistance: None ?activities of daily living that may affect ?Patient Identification Verified: Yes ?risk of falls: ?Secondary Verification Process Completed: Yes ?Signs or symptoms of abuse/neglect since last visito No ?Patient Requires Transmission-Based Precautions: No ?Hospitalized since last visit: No ?Patient Has Alerts: No ?Implantable device outside of the clinic excluding No ?cellular tissue based products placed in the center ?since last visit: ?Has Dressing in Place as Prescribed: Yes ?Pain Present Now: No ?Electronic Signature(s) ?Signed: 01/29/2022 4:31:55 PM By: Lorrin Jackson ?Entered By: Lorrin Jackson on 01/29/2022 09:26:13 ?-------------------------------------------------------------------------------- ?Clinic Level of Care Assessment Details ?Patient Name: Date of Service: ?[name], [name] 01/29/2022 9:30 A M ?Medical Record Number: [id number] ?Patient Account Number: [id number] ?Date of Birth/Sex: Treating RN: ?1950/12/14 (71 y.o. [name] ?Primary Care Sibley Rolison: [name] Other Clinician: ?Referring Caydee Talkington: ?Treating Michon Kaczmarek/Extender: Worthy Keeler ?[place], [name] ?Weeks in Treatment: 77 ?Clinic Level of Care Assessment Items ?TOOL 4 Quantity Score ?X- 1 0 ?Use when only an EandM is performed on FOLLOW-UP  visit ?ASSESSMENTS - Nursing Assessment / Reassessment ?X- 1 10 ?Reassessment of Co-morbidities (includes updates in patient status) ?X- 1 5 ?Reassessment of Adherence to Treatment Plan ?ASSESSMENTS - Wound and Skin A ssessment / Reassessment ?X - Simple Wound Assessment / Reassessment - one wound 1 5 ?'[]'$  - 0 ?Complex Wound Assessment / Reassessment - multiple wounds ?'[]'$  - 0 ?Dermatologic / Skin Assessment (not related to wound area) ?ASSESSMENTS - Focused Assessment ?'[]'$  - 0 ?Circumferential Edema Measurements - multi extremities ?'[]'$  - 0 ?Nutritional Assessment / Counseling / Intervention ?'[]'$  - 0 ?Lower Extremity Assessment (monofilament, tuning fork, pulses) ?'[]'$  - 0 ?Peripheral Arterial Disease Assessment (using hand held doppler) ?ASSESSMENTS - Ostomy and/or Continence Assessment and Care ?'[]'$  - 0 ?Incontinence Assessment and Management ?'[]'$  - 0 ?Ostomy Care Assessment and Management (repouching, etc.) ?PROCESS - Coordination of Care ?'[]'$  - 0 ?Simple Patient / Family Education for ongoing care ?X- 1 20 ?Complex (extensive) Patient / Family Education for ongoing care ?'[]'$  - 0 ?Staff obtains Consents, Records, T Results / Process Orders ?est ?'[]'$  - 0 ?Staff telephones HHA, Nursing Homes / Clarify orders / etc ?'[]'$  - 0 ?Routine Transfer to another Facility (non-emergent condition) ?'[]'$  - 0 ?Routine Hospital Admission (non-emergent condition) ?'[]'$  - 0 ?New Admissions / Biomedical engineer / Ordering NPWT Apligraf, etc. ?, ?'[]'$  - 0 ?Emergency Hospital Admission (emergent condition) ?'[]'$  - 0 ?Simple Discharge Coordination ?'[]'$  - 0 ?Complex (extensive) Discharge Coordination ?PROCESS - Special Needs ?'[]'$  - 0 ?Pediatric / Minor Patient Management ?'[]'$  - 0 ?Isolation Patient Management ?'[]'$  - 0 ?Hearing / Language / Visual special needs ?'[]'$  - 0 ?Assessment of Community assistance (transportation, D/C planning, etc.) ?'[]'$  - 0 ?Additional assistance / Altered mentation ?'[]'$  - 0 ?Support Surface(s) Assessment (bed, cushion, seat,  etc.) ?INTERVENTIONS - Wound Cleansing / Measurement ?X - Simple Wound Cleansing - one wound 1 5 ?'[]'$  - 0 ?Complex Wound Cleansing - multiple wounds ?X- 1 5 ?  Wound Imaging (photographs - any number of wounds) ?'[]'$  - 0 ?Wound Tracing (instead of photographs) ?X- 1 5 ?Simple Wound Measurement - one wound ?'[]'$  - 0 ?Complex Wound Measurement - multiple wounds ?INTERVENTIONS - Wound Dressings ?'[]'$  - 0 ?Small Wound Dressing one or multiple wounds ?X- 1 15 ?Medium Wound Dressing one or multiple wounds ?'[]'$  - 0 ?Large Wound Dressing one or multiple wounds ?'[]'$  - 0 ?Application of Medications - topical ?'[]'$  - 0 ?Application of Medications - injection ?INTERVENTIONS - Miscellaneous ?'[]'$  - 0 ?External ear exam ?'[]'$  - 0 ?Specimen Collection (cultures, biopsies, blood, body fluids, etc.) ?'[]'$  - 0 ?Specimen(s) / Culture(s) sent or taken to Lab for analysis ?'[]'$  - 0 ?Patient Transfer (multiple staff / Civil Service fast streamer / Similar devices) ?'[]'$  - 0 ?Simple Staple / Suture removal (25 or less) ?'[]'$  - 0 ?Complex Staple / Suture removal (26 or more) ?'[]'$  - 0 ?Hypo / Hyperglycemic Management (close monitor of Blood Glucose) ?'[]'$  - 0 ?Ankle / Brachial Index (ABI) - do not check if billed separately ?X- 1 5 ?Vital Signs ?Has the patient been seen at the hospital within the last three years: Yes ?Total Score: 75 ?Level Of Care: New/Established - Level 2 ?Electronic Signature(s) ?Signed: 01/29/2022 4:31:55 PM By: Lorrin Jackson ?Entered By: Lorrin Jackson on 01/29/2022 10:02:00 ?-------------------------------------------------------------------------------- ?Encounter Discharge Information Details ?Patient Name: Date of Service: ?[name], [name] 01/29/2022 9:30 A M ?Medical Record Number: [id number] ?Patient Account Number: [id number] ?Date of Birth/Sex: Treating RN: ?1951-03-29 (71 y.o. [name] ?Primary Care Prudencio Velazco: [name] Other Clinician: ?Referring Onur Mori: ?Treating Manraj Yeo/Extender: Worthy Keeler ?[place], [name] ?Weeks in Treatment:  53 ?Encounter Discharge Information Items ?Discharge Condition: Stable ?Ambulatory Status: Ambulatory ?Discharge Destination: Home ?Transportation: Private Auto ?Schedule Follow-up Appointment: Yes ?Clinical Summary of Care: Provided on 01/29/2022 ?Form Type Recipient ?Paper Patient Patient ?Electronic Signature(s) ?Signed: 01/29/2022 4:31:55 PM By: Lorrin Jackson ?Entered By: Lorrin Jackson on 01/29/2022 10:09:04 ?-------------------------------------------------------------------------------- ?Lower Extremity Assessment Details ?Patient Name: Date of Service: ?[name], [name] 01/29/2022 9:30 A M ?Medical Record Number: [id number] ?Patient Account Number: [id number] ?Date of Birth/Sex: Treating RN: ?1951/09/11 (71 y.o. [name] ?Primary Care Latorsha Curling: [name] Other Clinician: ?Referring Eliya Bubar: ?Treating Isatou Agredano/Extender: Worthy Keeler ?[place], [name] ?Weeks in Treatment: 76 ?Electronic Signature(s) ?Signed: 01/29/2022 4:31:55 PM By: Lorrin Jackson ?Entered By: Lorrin Jackson on 01/29/2022 09:34:42 ?-------------------------------------------------------------------------------- ?Multi-Disciplinary Care Plan Details ?Patient Name: ?Date of Service: ?[name], [name] 01/29/2022 9:30 A M ?Medical Record Number: [id number] ?Patient Account Number: [id number] ?Date of Birth/Sex: ?Treating RN: ?[birth year] (71 y.o. [name] ?Primary Care Adaleena Mooers: [name] ?Other Clinician: ?Referring Haadi Santellan: ?Treating Tovia Kisner/Extender: Worthy Keeler ?[place], [name] ?Weeks in Treatment: 48 ?Multidisciplinary Care Plan reviewed with physician ?Active Inactive ?Necrotic Tissue ?Nursing Diagnoses: ?Knowledge deficit related to management of necrotic/devitalized tissue ?Goals: ?Necrotic/devitalized tissue will be minimized in the wound bed ?Date Initiated: 07/10/2021 ?Target Resolution Date: 02/05/2022 ?Goal Status: Active ?Interventions: ?Provide education on necrotic tissue and debridement  process ?Treatment Activities: ?Excisional debridement : 07/10/2021 ?Notes: ?01/01/22: Continues to use Santyl ?Wound/Skin Impairment ?Nursing Diagnoses: ?Impaired tissue integrity ?Knowledge deficit related to ulceration/compr

## 2022-02-10 DIAGNOSIS — M4722 Other spondylosis with radiculopathy, cervical region: Secondary | ICD-10-CM | POA: Diagnosis not present

## 2022-02-10 DIAGNOSIS — I10 Essential (primary) hypertension: Secondary | ICD-10-CM | POA: Diagnosis not present

## 2022-02-13 ENCOUNTER — Inpatient Hospital Stay: Payer: Medicare Other | Attending: Hematology & Oncology

## 2022-02-13 ENCOUNTER — Encounter: Payer: Self-pay | Admitting: Hematology & Oncology

## 2022-02-13 ENCOUNTER — Telehealth: Payer: Self-pay

## 2022-02-13 ENCOUNTER — Other Ambulatory Visit: Payer: Self-pay

## 2022-02-13 ENCOUNTER — Inpatient Hospital Stay (HOSPITAL_BASED_OUTPATIENT_CLINIC_OR_DEPARTMENT_OTHER): Payer: Medicare Other | Admitting: Hematology & Oncology

## 2022-02-13 VITALS — BP 157/58 | HR 67 | Temp 98.6°F | Resp 18 | Ht 64.0 in | Wt 186.0 lb

## 2022-02-13 DIAGNOSIS — C50011 Malignant neoplasm of nipple and areola, right female breast: Secondary | ICD-10-CM

## 2022-02-13 DIAGNOSIS — N189 Chronic kidney disease, unspecified: Secondary | ICD-10-CM | POA: Diagnosis not present

## 2022-02-13 DIAGNOSIS — D509 Iron deficiency anemia, unspecified: Secondary | ICD-10-CM | POA: Diagnosis not present

## 2022-02-13 DIAGNOSIS — Z7982 Long term (current) use of aspirin: Secondary | ICD-10-CM | POA: Insufficient documentation

## 2022-02-13 DIAGNOSIS — Z8639 Personal history of other endocrine, nutritional and metabolic disease: Secondary | ICD-10-CM | POA: Insufficient documentation

## 2022-02-13 DIAGNOSIS — D631 Anemia in chronic kidney disease: Secondary | ICD-10-CM | POA: Diagnosis not present

## 2022-02-13 DIAGNOSIS — M818 Other osteoporosis without current pathological fracture: Secondary | ICD-10-CM | POA: Diagnosis not present

## 2022-02-13 DIAGNOSIS — Z79811 Long term (current) use of aromatase inhibitors: Secondary | ICD-10-CM | POA: Insufficient documentation

## 2022-02-13 DIAGNOSIS — D638 Anemia in other chronic diseases classified elsewhere: Secondary | ICD-10-CM | POA: Insufficient documentation

## 2022-02-13 DIAGNOSIS — D500 Iron deficiency anemia secondary to blood loss (chronic): Secondary | ICD-10-CM

## 2022-02-13 DIAGNOSIS — C50911 Malignant neoplasm of unspecified site of right female breast: Secondary | ICD-10-CM | POA: Insufficient documentation

## 2022-02-13 DIAGNOSIS — Z923 Personal history of irradiation: Secondary | ICD-10-CM | POA: Insufficient documentation

## 2022-02-13 HISTORY — DX: Anemia in chronic kidney disease: D63.1

## 2022-02-13 LAB — CBC WITH DIFFERENTIAL (CANCER CENTER ONLY)
Abs Immature Granulocytes: 0.05 10*3/uL (ref 0.00–0.07)
Basophils Absolute: 0 10*3/uL (ref 0.0–0.1)
Basophils Relative: 1 %
Eosinophils Absolute: 0.2 10*3/uL (ref 0.0–0.5)
Eosinophils Relative: 4 %
HCT: 34.9 % — ABNORMAL LOW (ref 36.0–46.0)
Hemoglobin: 10.7 g/dL — ABNORMAL LOW (ref 12.0–15.0)
Immature Granulocytes: 1 %
Lymphocytes Relative: 30 %
Lymphs Abs: 1.7 10*3/uL (ref 0.7–4.0)
MCH: 27 pg (ref 26.0–34.0)
MCHC: 30.7 g/dL (ref 30.0–36.0)
MCV: 88.1 fL (ref 80.0–100.0)
Monocytes Absolute: 0.4 10*3/uL (ref 0.1–1.0)
Monocytes Relative: 7 %
Neutro Abs: 3.2 10*3/uL (ref 1.7–7.7)
Neutrophils Relative %: 57 %
Platelet Count: 198 10*3/uL (ref 150–400)
RBC: 3.96 MIL/uL (ref 3.87–5.11)
RDW: 16.3 % — ABNORMAL HIGH (ref 11.5–15.5)
WBC Count: 5.5 10*3/uL (ref 4.0–10.5)
nRBC: 0 % (ref 0.0–0.2)

## 2022-02-13 LAB — CMP (CANCER CENTER ONLY)
ALT: 12 U/L (ref 0–44)
AST: 13 U/L — ABNORMAL LOW (ref 15–41)
Albumin: 4.1 g/dL (ref 3.5–5.0)
Alkaline Phosphatase: 72 U/L (ref 38–126)
Anion gap: 7 (ref 5–15)
BUN: 25 mg/dL — ABNORMAL HIGH (ref 8–23)
CO2: 30 mmol/L (ref 22–32)
Calcium: 9.3 mg/dL (ref 8.9–10.3)
Chloride: 107 mmol/L (ref 98–111)
Creatinine: 0.67 mg/dL (ref 0.44–1.00)
GFR, Estimated: >60 mL/min (ref >60)
Glucose, Bld: 99 mg/dL (ref 70–99)
Potassium: 4 mmol/L (ref 3.5–5.1)
Sodium: 144 mmol/L (ref 135–145)
Total Bilirubin: 0.5 mg/dL (ref 0.3–1.2)
Total Protein: 7.3 g/dL (ref 6.5–8.1)

## 2022-02-13 LAB — LACTATE DEHYDROGENASE: LDH: 164 U/L (ref 98–192)

## 2022-02-13 LAB — RETICULOCYTES
Immature Retic Fract: 11.9 % (ref 2.3–15.9)
RBC.: 3.92 MIL/uL (ref 3.87–5.11)
Retic Count, Absolute: 59.6 10*3/uL (ref 19.0–186.0)
Retic Ct Pct: 1.5 % (ref 0.4–3.1)

## 2022-02-13 LAB — IRON AND IRON BINDING CAPACITY (CC-WL,HP ONLY)
Iron: 61 ug/dL (ref 28–170)
Saturation Ratios: 23 % (ref 10.4–31.8)
TIBC: 262 ug/dL (ref 250–450)
UIBC: 201 ug/dL (ref 148–442)

## 2022-02-13 LAB — FERRITIN: Ferritin: 506 ng/mL — ABNORMAL HIGH (ref 11–307)

## 2022-02-13 NOTE — Telephone Encounter (Addendum)
?  Patient notified via phone of attached message. Verbalizes understanding. dph ? ?----- Message from [name], MD sent at 02/13/2022 12:48 PM EDT ----- ?Call - the iron is ok.Laurey Arrow ?

## 2022-02-13 NOTE — Progress Notes (Signed)
?Hematology and Oncology Follow Up Visit ? ?[name] ?[id number] ?Aug 27, 1951 71 y.o. ?02/13/2022 ? ? ?Principle Diagnosis:  ?Locally recurrent adenocarcinoma of the right breast ?History of superficial venous thrombus of the right thigh ?Iron deficiency anemia ?Erythropoietin deficient anemia ?  ?Current Therapy:        ?1. Aromasin 25 mg p.o. daily - on hold -- start on 03/18/2021 -- hold on 01/02/1022 ?2. Aspirin 81 mg p.o. daily ?3.  Feraheme-510 mg IV as needed.  Last dose given on 08/23/2021 ?4.  Aranesp 300 mcg sq for hemoglobin less than 11  ? ?Interim History:  [name] is here today for follow-up.  She is still feeling quite tired.  She is having problems with her right knee.  She had some injections into the knee but this really did not help her. ? ?I still think that her big problem is her anemia.  Her erythropoietin level that we checked when she was last here was only 11.  As such, I Apolonio Schneiders think that she is going to be able to mount an erythropoietic response.  We are going to have to think about given her ESA. ? ?She still has issues with arthritis.  We stop the Aromasin when she was here last.  I think we can still hold the Aromasin for right now. ? ?She has had no fever.  There is been no cough or shortness of breath.  She has had no nausea or vomiting.  There is been no change in bowel or bladder habits. ? ?Overall, I would say performance status is probably ECOG 2.    ? ?Medications:  ?Allergies as of 02/13/2022   ? ?   Reactions  ? Contrast Media [iodinated Contrast Media] Hives, Shortness Of Breath, Nausea And Vomiting  ? Allergic reaction even after pre-meds.  ? Metrizamide Hives, Shortness Of Breath, Nausea And Vomiting  ? Allergic reaction even after pre-meds.  ? Other Shortness Of Breath, Nausea And Vomiting, Rash  ? Allergic reaction even after pre-meds.  ? Dye Fdc Blue [brilliant Blue Fcf (fd&c Blue #1)] Hives, Nausea Only  ? Fd&c Blue #1 (brilliant Blue Fcf) Hives, Nausea Only  ? ?  ? ?   ?Medication List  ?  ? ?  ? Accurate as of Feb 13, 2022  9:32 AM. If you have any questions, ask your nurse or doctor.  ?  ?  ? ?  ? ?amLODipine 10 MG tablet ?Commonly known as: NORVASC ?TAKE 1 TABLET BY MOUTH DAILY ?  ?aspirin EC 81 MG tablet ?Take 81 mg by mouth daily. ?  ?candesartan 32 MG tablet ?Commonly known as: ATACAND ?Take 32 mg by mouth daily. ?  ?celecoxib 200 MG capsule ?Commonly known as: CELEBREX ?Take 200 mg by mouth daily as needed. ?  ?exemestane 25 MG tablet ?Commonly known as: AROMASIN ?TAKE 1 TABLET BY MOUTH EVERY DAY ?  ?hydrochlorothiazide 25 MG tablet ?Commonly known as: HYDRODIURIL ?Take 25 mg by mouth daily. ?  ?ibuprofen 200 MG tablet ?Commonly known as: ADVIL ?Take 200 mg by mouth every 6 (six) hours as needed. ?  ?metoprolol succinate 50 MG 24 hr tablet ?Commonly known as: TOPROL-XL ?Take 50 mg by mouth daily. ?  ?Santyl 250 UNIT/GM ointment ?Generic drug: collagenase ?Apply topically. ?  ?simvastatin 20 MG tablet ?Commonly known as: ZOCOR ?Take 20 mg by mouth daily. ?  ?Vitamin D (Ergocalciferol) 1.25 MG (50000 UNIT) Caps capsule ?Commonly known as: DRISDOL ?TAKE 1 CAPSULE BY MOUTH ONE TIME PER WEEK ?  ? ?  ? ? ?  Allergies:  ?Allergies  ?Allergen Reactions  ? Contrast Media [Iodinated Contrast Media] Hives, Shortness Of Breath and Nausea And Vomiting  ?  Allergic reaction even after pre-meds.  ? Metrizamide Hives, Shortness Of Breath and Nausea And Vomiting  ?  Allergic reaction even after pre-meds.  ? Other Shortness Of Breath, Nausea And Vomiting and Rash  ?  Allergic reaction even after pre-meds.  ? Dye Fdc Blue [Brilliant Blue Fcf (Fd&C Blue #1)] Hives and Nausea Only  ? Fd&C Blue #1 (Brilliant Blue Fcf) Hives and Nausea Only  ? ? ?Past Medical History, Surgical history, Social history, and Family History were reviewed and updated. ? ?Review of Systems: ?Review of Systems  ?Constitutional: Negative.   ?HENT:  Positive for hearing loss.   ?Eyes:  Positive for discharge.  ?Respiratory:  Negative.    ?Cardiovascular: Negative.   ?Gastrointestinal: Negative.   ?Genitourinary: Negative.   ?Musculoskeletal: Negative.   ?Skin: Negative.   ?Neurological: Negative.   ?Endo/Heme/Allergies: Negative.   ?Psychiatric/Behavioral: Negative.    ? ? ?Physical Exam: ? height is '5\' 4"'$  (1.626 m) and weight is 186 lb (84.4 kg). Her oral temperature is 98.6 ?F (37 ?C). Her blood pressure is 157/58 (abnormal) and her pulse is 67. Her respiration is 18 and oxygen saturation is 99%.  ? ?Wt Readings from Last 3 Encounters:  ?02/13/22 186 lb (84.4 kg)  ?01/02/22 180 lb 6.4 oz (81.8 kg)  ?08/13/21 187 lb 8 oz (85 kg)  ? ?Physical Exam ?Vitals reviewed.  ?Constitutional:   ?   Comments: On her breast exam, her left breast is without any masses, edema or erythema.  There is no left axillary adenopathy.  There is no discharge from the nipple on the left breast.  Right chest wall shows radiation dermatitis which is about the same.  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  There is no obvious exudate.  There is no tenderness.  There is no right axillary adenopathy.  ?HENT:  ?   Head: Normocephalic and atraumatic.  ?Eyes:  ?   Pupils: Pupils are equal, round, and reactive to light.  ?   Comments: Her ocular exam does show this stye on the upper eyelid of the left eye.  There is an area of purulent material that looks like he might be coming out soon.  She has good extraocular muscle movement.  Pupils react appropriately.  ?Cardiovascular:  ?   Rate and Rhythm: Normal rate and regular rhythm.  ?   Heart sounds: Normal heart sounds.  ?Pulmonary:  ?   Effort: Pulmonary effort is normal.  ?   Breath sounds: Normal breath sounds.  ?Abdominal:  ?   General: Bowel sounds are normal.  ?   Palpations: Abdomen is soft.  ?Musculoskeletal:     ?   General: No tenderness or deformity. Normal range of motion.  ?   Cervical back: Normal range of motion.  ?Lymphadenopathy:  ?   Cervical: No cervical adenopathy.  ?Skin: ?    General: Skin is warm and dry.  ?   Findings: No erythema or rash.  ?Neurological:  ?   Mental Status: She is alert and oriented to person, place, and time.  ?Psychiatric:     ?   Behavior: Behavior normal.     ?   Thought Content: Thought content normal.     ?   Judgment: Judgment normal.  ? ? ? ? ?Lab Results  ?Component Value Date  ? WBC  5.5 02/13/2022  ? HGB 10.7 (L) 02/13/2022  ? HCT 34.9 (L) 02/13/2022  ? MCV 88.1 02/13/2022  ? PLT 198 02/13/2022  ? ?Lab Results  ?Component Value Date  ? FERRITIN 643 (H) 01/02/2022  ? IRON 48 01/02/2022  ? TIBC 252 01/02/2022  ? UIBC 204 01/02/2022  ? IRONPCTSAT 19 01/02/2022  ? ?Lab Results  ?Component Value Date  ? RETICCTPCT 1.5 02/13/2022  ? RBC 3.92 02/13/2022  ? RETICCTABS 55.2 06/20/2011  ? ?No results found for: KPAFRELGTCHN, LAMBDASER, KAPLAMBRATIO ?No results found for: IGGSERUM, IGA, IGMSERUM ?Lab Results  ?Component Value Date  ? TOTALPROTELP 6.6 08/14/2010  ? ALBUMINELP 53.8 (L) 08/14/2010  ? A1GS 5.3 (H) 08/14/2010  ? A2GS 14.3 (H) 08/14/2010  ? BETS 5.8 08/14/2010  ? BETA2SER 6.3 08/14/2010  ? GAMS 14.5 08/14/2010  ? MSPIKE NOT DET 08/14/2010  ? SPEI * 08/14/2010  ? ?  Chemistry   ?   ?Component Value Date/Time  ? NA 142 01/02/2022 1011  ? NA 146 (H) 07/23/2017 0900  ? NA 144 02/18/2017 0901  ? K 4.1 01/02/2022 1011  ? K 3.7 07/23/2017 0900  ? K 3.7 02/18/2017 0901  ? CL 108 01/02/2022 1011  ? CL 107 07/23/2017 0900  ? CO2 27 01/02/2022 1011  ? CO2 31 07/23/2017 0900  ? CO2 27 02/18/2017 0901  ? BUN 30 (H) 01/02/2022 1011  ? BUN 16 07/23/2017 0900  ? BUN 17.7 02/18/2017 0901  ? CREATININE 0.67 01/02/2022 1011  ? CREATININE 0.8 07/23/2017 0900  ? CREATININE 0.7 02/18/2017 0901  ?    ?Component Value Date/Time  ? CALCIUM 9.3 01/02/2022 1011  ? CALCIUM 9.3 07/23/2017 0900  ? CALCIUM 9.3 02/18/2017 0901  ? ALKPHOS 66 01/02/2022 1011  ? ALKPHOS 67 07/23/2017 0900  ? ALKPHOS 78 02/18/2017 0901  ? AST 11 (L) 01/02/2022 1011  ? AST 13 02/18/2017 0901  ? ALT 11  01/02/2022 1011  ? ALT 20 07/23/2017 0900  ? ALT 12 02/18/2017 0901  ? BILITOT 0.5 01/02/2022 1011  ? BILITOT 0.54 02/18/2017 0901  ?  ? ? ? ?Impression and Plan: [name] is a very pleasant 71 yo caucasian femal

## 2022-02-14 LAB — CANCER ANTIGEN 27.29: CA 27.29: 21.9 U/mL (ref 0.0–38.6)

## 2022-02-17 ENCOUNTER — Inpatient Hospital Stay: Payer: Medicare Other

## 2022-02-17 VITALS — BP 162/62 | HR 78 | Temp 98.8°F | Resp 17

## 2022-02-17 DIAGNOSIS — D631 Anemia in chronic kidney disease: Secondary | ICD-10-CM | POA: Diagnosis not present

## 2022-02-17 DIAGNOSIS — Z79811 Long term (current) use of aromatase inhibitors: Secondary | ICD-10-CM | POA: Diagnosis not present

## 2022-02-17 DIAGNOSIS — M818 Other osteoporosis without current pathological fracture: Secondary | ICD-10-CM | POA: Diagnosis not present

## 2022-02-17 DIAGNOSIS — Z7982 Long term (current) use of aspirin: Secondary | ICD-10-CM | POA: Diagnosis not present

## 2022-02-17 DIAGNOSIS — N189 Chronic kidney disease, unspecified: Secondary | ICD-10-CM | POA: Diagnosis not present

## 2022-02-17 DIAGNOSIS — C50911 Malignant neoplasm of unspecified site of right female breast: Secondary | ICD-10-CM | POA: Diagnosis not present

## 2022-02-17 DIAGNOSIS — D509 Iron deficiency anemia, unspecified: Secondary | ICD-10-CM | POA: Diagnosis not present

## 2022-02-17 DIAGNOSIS — Z8639 Personal history of other endocrine, nutritional and metabolic disease: Secondary | ICD-10-CM | POA: Diagnosis not present

## 2022-02-17 DIAGNOSIS — D638 Anemia in other chronic diseases classified elsewhere: Secondary | ICD-10-CM | POA: Diagnosis not present

## 2022-02-17 DIAGNOSIS — D508 Other iron deficiency anemias: Secondary | ICD-10-CM

## 2022-02-17 MED ORDER — DARBEPOETIN ALFA 300 MCG/0.6ML IJ SOSY
300.0000 ug | PREFILLED_SYRINGE | Freq: Once | INTRAMUSCULAR | Status: AC
  Administered 2022-02-17: 300 ug via SUBCUTANEOUS
  Filled 2022-02-17: qty 0.6

## 2022-02-17 NOTE — Patient Instructions (Signed)
Darbepoetin Alfa injection ?What is this medication? ?DARBEPOETIN ALFA (dar be POE e tin  AL fa) helps your body make more red blood cells. It is used to treat anemia caused by chronic kidney failure and chemotherapy. ?This medicine may be used for other purposes; ask your health care provider or pharmacist if you have questions. ?COMMON BRAND NAME(S): Aranesp ?What should I tell my care team before I take this medication? ?They need to know if you have any of these conditions: ?blood clotting disorders or history of blood clots ?cancer patient not on chemotherapy ?cystic fibrosis ?heart disease, such as angina, heart failure, or a history of a heart attack ?hemoglobin level of 12 g/dL or greater ?high blood pressure ?low levels of folate, iron, or vitamin B12 ?seizures ?an unusual or allergic reaction to darbepoetin, erythropoietin, albumin, hamster proteins, latex, other medicines, foods, dyes, or preservatives ?pregnant or trying to get pregnant ?breast-feeding ?How should I use this medication? ?This medicine is for injection into a vein or under the skin. It is usually given by a health care professional in a hospital or clinic setting. ?If you get this medicine at home, you will be taught how to prepare and give this medicine. Use exactly as directed. Take your medicine at regular intervals. Do not take your medicine more often than directed. ?It is important that you put your used needles and syringes in a special sharps container. Do not put them in a trash can. If you do not have a sharps container, call your pharmacist or healthcare provider to get one. ?A special MedGuide will be given to you by the pharmacist with each prescription and refill. Be sure to read this information carefully each time. ?Talk to your pediatrician regarding the use of this medicine in children. While this medicine may be used in children as young as 1 month of age for selected conditions, precautions do apply. ?Overdosage: If  you think you have taken too much of this medicine contact a poison control center or emergency room at once. ?NOTE: This medicine is only for you. Do not share this medicine with others. ?What if I miss a dose? ?If you miss a dose, take it as soon as you can. If it is almost time for your next dose, take only that dose. Do not take double or extra doses. ?What may interact with this medication? ?Do not take this medicine with any of the following medications: ?epoetin alfa ?This list may not describe all possible interactions. Give your health care provider a list of all the medicines, herbs, non-prescription drugs, or dietary supplements you use. Also tell them if you smoke, drink alcohol, or use illegal drugs. Some items may interact with your medicine. ?What should I watch for while using this medication? ?Your condition will be monitored carefully while you are receiving this medicine. ?You may need blood work done while you are taking this medicine. ?This medicine may cause a decrease in vitamin B6. You should make sure that you get enough vitamin B6 while you are taking this medicine. Discuss the foods you eat and the vitamins you take with your health care professional. ?What side effects may I notice from receiving this medication? ?Side effects that you should report to your doctor or health care professional as soon as possible: ?allergic reactions like skin rash, itching or hives, swelling of the face, lips, or tongue ?breathing problems ?changes in vision ?chest pain ?confusion, trouble speaking or understanding ?feeling faint or lightheaded, falls ?high blood   pressure ?muscle aches or pains ?pain, swelling, warmth in the leg ?rapid weight gain ?severe headaches ?sudden numbness or weakness of the face, arm or leg ?trouble walking, dizziness, loss of balance or coordination ?seizures (convulsions) ?swelling of the ankles, feet, hands ?unusually weak or tired ?Side effects that usually do not require  medical attention (report to your doctor or health care professional if they continue or are bothersome): ?diarrhea ?fever, chills (flu-like symptoms) ?headaches ?nausea, vomiting ?redness, stinging, or swelling at site where injected ?This list may not describe all possible side effects. Call your doctor for medical advice about side effects. You may report side effects to FDA at 1-800-FDA-1088. ?Where should I keep my medication? ?Keep out of the reach of children and pets. ?Store in a refrigerator. Do not freeze. Do not shake. Throw away any unused portion if using a single-dose vial. Multi-dose vials can be kept in the refrigerator for up to 21 days after the initial dose. Throw away unused medicine. ?To get rid of medications that are no longer needed or have expired: ?Take the medication to a medication take-back program. Check with your pharmacy or law enforcement to find a location. ?If you cannot return the medication, ask your pharmacist or care team how to get rid of the medication safely. ?NOTE: This sheet is a summary. It may not cover all possible information. If you have questions about this medicine, talk to your doctor, pharmacist, or health care provider. ?? 2023 [employer]vier/Gold Standard (2021-09-30 00:00:00) ? ?

## 2022-02-26 ENCOUNTER — Encounter (HOSPITAL_BASED_OUTPATIENT_CLINIC_OR_DEPARTMENT_OTHER): Payer: Medicare Other | Attending: Physician Assistant | Admitting: Physician Assistant

## 2022-02-26 DIAGNOSIS — I10 Essential (primary) hypertension: Secondary | ICD-10-CM | POA: Diagnosis not present

## 2022-02-26 DIAGNOSIS — L98492 Non-pressure chronic ulcer of skin of other sites with fat layer exposed: Secondary | ICD-10-CM | POA: Diagnosis not present

## 2022-02-26 DIAGNOSIS — L598 Other specified disorders of the skin and subcutaneous tissue related to radiation: Secondary | ICD-10-CM | POA: Insufficient documentation

## 2022-02-26 DIAGNOSIS — Z86718 Personal history of other venous thrombosis and embolism: Secondary | ICD-10-CM | POA: Diagnosis not present

## 2022-02-26 DIAGNOSIS — Z9011 Acquired absence of right breast and nipple: Secondary | ICD-10-CM | POA: Insufficient documentation

## 2022-02-26 DIAGNOSIS — Z853 Personal history of malignant neoplasm of breast: Secondary | ICD-10-CM | POA: Insufficient documentation

## 2022-02-26 NOTE — Progress Notes (Addendum)
[name], [name] ([id number]) ?Visit Report for 02/26/2022 ?Chief Complaint Document Details ?Patient Name: Date of Service: ?[name], [name] 02/26/2022 9:30 A M ?Medical Record Number: [id number] ?Patient Account Number: [id number] ?Date of Birth/Sex: Treating RN: ?July 19, 1951 (71 y.o. 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 F) [name], Bobbi ?Primary Care Provider: [name] Other Clinician: ?Referring Provider: ?Treating Provider/Extender: [name] ?[place], [name] ?Weeks in Treatment: 79 ?Debridement Performed for Assessment: Wound #1 Right Breast (mastectomy site) ?Performed By: Physician [name] Keeler, PA ?Debridement Type: Debridement ?Level of Consciousness (Pre-procedure): Awake and Alert ?Pre-procedure Verification/Time Out Yes - 09:28 ?Taken: ?Start Time: 09:29 ?Pain Control: ?Other : benzocaine 20% ?T Area Debrided (L x W): ?otal 1.7 (cm) x 2.6 (cm) = 4.42 (cm?) ?Tissue and other material debrided: Viable, Non-Viable, Slough, Subcutaneous, Biofilm, Fibrin/Exudate, Slough ?Level: Skin/Subcutaneous Tissue ?Debridement Description: Excisional ?Instrument: Curette ?Bleeding: Minimum ?Hemostasis Achieved: Pressure ?End Time: 09:32 ?Procedural Pain: 0 ?Post Procedural Pain: 0 ?Response to Treatment: Procedure was tolerated well ?Level  of Consciousness (Post- Awake and Alert ?procedure): ?Post Debridement Measurements of Total Wound ?Length: (cm) 1.7 ?Width: (cm) 2.6 ?Depth: (cm) 0.5 ?Volume: (cm?) 1.736 ?Character of Wound/Ulcer Post Debridement: Improved ?Post Procedure Diagnosis ?Same as Pre-procedure ?Electronic Signature(s) ?Signed: 02/26/2022 4:18:49 PM By: [name] Keeler PA-C ?Signed: 02/26/2022 4:50:23 PM By: [name] RN, BSN ?Entered By: [name] on 02/26/2022 09:33:34 ?-------------------------------------------------------------------------------- ?HPI Details ?Patient Name: Date of Service: ?[name], [name] 02/26/2022 9:30 A M ?Medical Record Number: [id number] ?Patient Account Number: [id number] ?Date of Birth/Sex: Treating RN: ?1951-06-25 (71 y.o. [name] ?Primary Care Provider: [name] Other Clinician: ?Referring Provider: ?Treating Provider/Extender: [name] ?[place], [name] ?Weeks in Treatment: 79 ?History of Present Illness ?HPI Description: 08/22/2020 upon evaluation today patient actually appears to be doing somewhat poorly in regard to her mastectomy site on the right chest ?wall. She had a mastectomy initially in 1998. She had chemotherapy only no radiation following. In 2002 she subsequently did undergo radiation for the first ?time and then subsequently in 2012 had repeat radiation after having had a finding that was consistent with a relapse of the cancer. 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 She also has radiation damage to the skin over the right chest wall where she had the ?mastectomy. She was referred to [place] by Dr. Matthew Saras in [facility] and her oncologist is Dr. Burney Gauze. Subsequently I want to make sure before ?we delve into this more deeply that the patient does not have any issues with a return of cancer that needs to be managed by them. Obviously she does have ?significant scar tissue which may be benefited  secondary to the soft tissue radionecrosis by hyperbaric oxygen therapy in the absence of any recurrence of ?the cancer. Nonetheless she does not remember exactly when her last PET scan was but it was not too recently she tells me. She does have a history of a ?DVT in the right leg in 2013. The patient does  have hypertension as well. She sees her oncologist next on December 6. ?09/12/2020 on evaluation today patient actually appears to be doing excellent in regard to her wound under the right breast location. Currently this is measuring ?smaller in general seems to be doing very well. She tells me that the collagen is doing a good job and that the dressing that she has been putting on is not ?causing any troubles as far pulling on her skin or scar tissue is concerned all of which is excellent news. ?10/03/2020 upon evaluation today patient appears to be doing well with regard to her surgical site from her mastectomy on the right breast region. She tells me ?that she has had very little bleeding nothing seems to be sticking badly and in general she has been extremely pleased with where things stand today. No ?fevers, chills, nausea, vomiting, or diarrhea. ?10/24/2020 upon evaluation today patient actually appears to be doing excellent in regard to her wound. There is just a very small area still open and to be ?honest there was minimal slough noted on the surface of the wound nothing that requires sharp debridement. In general I feel like she is actually doing quite ?well and overall I am pleased. I do think we may switch to silver alginate dressing and also contemplating using a AandE ointment in order to help keep ?everything moist and the scar tissue region while the alginate keeps it dry enough to heal ?11/14/2020 upon evaluation today patient appears to be doing well at this point in regard to her wound. I do feel like that she is making good progress again this is ?a very difficult region over the surgical site of  the right chest wall at the site of mastectomy. Nonetheless I think that we are just a very small area still open I ?think alginate is doing a good job helping to dry this up and I would recommend this such that we continue with this. ?01/02/2021 on evaluation today patient's wound actually appears to be doing decently well. There does not appear to be any signs of active infection which is ?great news. She does have some slight slough buildup with biofilm on the surface of the wound mainly more biofilm. Nonetheless I was able to gently remove ?this with saline and gauze as well as a sterile Q-tip. She tolerated that today without complication. ?01/30/2021 upon evaluation today patient appears to be doing well with regard to her wound although she still has quite a bit of trouble getting this to close I think ?the scar tissue and radiation damage is quite significant here unfortunately. There does not appear to be any signs of active infection which is great news. No ?fevers, chills, nausea, vomiting, or diarrhea. ?02/27/2021 upon evaluation today patient appears to be doing excellent in regard to her wound. She has been tolerating the dressing changes without ?complication and in general I am extremely pleased with where things stand today. There does not appear to be any signs of infection which is great news. No ?fevers, chills, nausea, vomiting, or diarrhea. With that being said I do think that she still developing some slough buildup. I did discuss with her today the ?possibility of proceeding with HBO therapy. We have had this discussion before but she really is not quite committed to wanting to do that much and spend ?that much time in the chamber. She wants to still think about it. ?03/27/2021 upon evaluation today patient appears to be doing about the same in regard  to her wound. She still developing a lot of slough buildup on the surface ?of the wound. I did try to clean that away to some degree today. She  tolerated that without any pain or complication. Subsequently I am thinking we may want ?to switch over to [facility] see if this may do better for her. Patient is in agreement with giving that a trial. ?05/01/2021 up

## 2022-03-13 DIAGNOSIS — I10 Essential (primary) hypertension: Secondary | ICD-10-CM | POA: Diagnosis not present

## 2022-03-13 DIAGNOSIS — M4722 Other spondylosis with radiculopathy, cervical region: Secondary | ICD-10-CM | POA: Diagnosis not present

## 2022-03-17 ENCOUNTER — Encounter: Payer: Self-pay | Admitting: Hematology & Oncology

## 2022-03-17 ENCOUNTER — Telehealth: Payer: Self-pay | Admitting: *Deleted

## 2022-03-17 ENCOUNTER — Inpatient Hospital Stay: Payer: Medicare Other

## 2022-03-17 ENCOUNTER — Inpatient Hospital Stay: Payer: Medicare Other | Attending: Hematology & Oncology

## 2022-03-17 ENCOUNTER — Inpatient Hospital Stay (HOSPITAL_BASED_OUTPATIENT_CLINIC_OR_DEPARTMENT_OTHER): Payer: Medicare Other | Admitting: Hematology & Oncology

## 2022-03-17 VITALS — BP 159/63 | HR 79 | Temp 98.3°F | Resp 20 | Ht 64.0 in | Wt 187.0 lb

## 2022-03-17 DIAGNOSIS — Z7982 Long term (current) use of aspirin: Secondary | ICD-10-CM | POA: Diagnosis not present

## 2022-03-17 DIAGNOSIS — D631 Anemia in chronic kidney disease: Secondary | ICD-10-CM | POA: Diagnosis not present

## 2022-03-17 DIAGNOSIS — Z923 Personal history of irradiation: Secondary | ICD-10-CM | POA: Diagnosis not present

## 2022-03-17 DIAGNOSIS — Z79811 Long term (current) use of aromatase inhibitors: Secondary | ICD-10-CM | POA: Insufficient documentation

## 2022-03-17 DIAGNOSIS — I890 Lymphedema, not elsewhere classified: Secondary | ICD-10-CM | POA: Insufficient documentation

## 2022-03-17 DIAGNOSIS — D638 Anemia in other chronic diseases classified elsewhere: Secondary | ICD-10-CM | POA: Diagnosis not present

## 2022-03-17 DIAGNOSIS — D509 Iron deficiency anemia, unspecified: Secondary | ICD-10-CM | POA: Insufficient documentation

## 2022-03-17 DIAGNOSIS — C50911 Malignant neoplasm of unspecified site of right female breast: Secondary | ICD-10-CM | POA: Insufficient documentation

## 2022-03-17 DIAGNOSIS — D500 Iron deficiency anemia secondary to blood loss (chronic): Secondary | ICD-10-CM

## 2022-03-17 LAB — IRON AND IRON BINDING CAPACITY (CC-WL,HP ONLY)
Iron: 55 ug/dL (ref 28–170)
Saturation Ratios: 21 % (ref 10.4–31.8)
TIBC: 263 ug/dL (ref 250–450)
UIBC: 208 ug/dL (ref 148–442)

## 2022-03-17 LAB — CMP (CANCER CENTER ONLY)
ALT: 12 U/L (ref 0–44)
AST: 14 U/L — ABNORMAL LOW (ref 15–41)
Albumin: 4.2 g/dL (ref 3.5–5.0)
Alkaline Phosphatase: 81 U/L (ref 38–126)
Anion gap: 8 (ref 5–15)
BUN: 23 mg/dL (ref 8–23)
CO2: 30 mmol/L (ref 22–32)
Calcium: 9.5 mg/dL (ref 8.9–10.3)
Chloride: 105 mmol/L (ref 98–111)
Creatinine: 0.76 mg/dL (ref 0.44–1.00)
GFR, Estimated: >60 mL/min (ref >60)
Glucose, Bld: 112 mg/dL — ABNORMAL HIGH (ref 70–99)
Potassium: 3.8 mmol/L (ref 3.5–5.1)
Sodium: 143 mmol/L (ref 135–145)
Total Bilirubin: 0.3 mg/dL (ref 0.3–1.2)
Total Protein: 7.5 g/dL (ref 6.5–8.1)

## 2022-03-17 LAB — CBC WITH DIFFERENTIAL (CANCER CENTER ONLY)
Abs Immature Granulocytes: 0.02 10*3/uL (ref 0.00–0.07)
Basophils Absolute: 0.1 10*3/uL (ref 0.0–0.1)
Basophils Relative: 1 %
Eosinophils Absolute: 0.2 10*3/uL (ref 0.0–0.5)
Eosinophils Relative: 3 %
HCT: 38.9 % (ref 36.0–46.0)
Hemoglobin: 12 g/dL (ref 12.0–15.0)
Immature Granulocytes: 0 %
Lymphocytes Relative: 33 %
Lymphs Abs: 2 10*3/uL (ref 0.7–4.0)
MCH: 27.7 pg (ref 26.0–34.0)
MCHC: 30.8 g/dL (ref 30.0–36.0)
MCV: 89.8 fL (ref 80.0–100.0)
Monocytes Absolute: 0.4 10*3/uL (ref 0.1–1.0)
Monocytes Relative: 7 %
Neutro Abs: 3.5 10*3/uL (ref 1.7–7.7)
Neutrophils Relative %: 56 %
Platelet Count: 204 10*3/uL (ref 150–400)
RBC: 4.33 MIL/uL (ref 3.87–5.11)
RDW: 15.4 % (ref 11.5–15.5)
WBC Count: 6.1 10*3/uL (ref 4.0–10.5)
nRBC: 0 % (ref 0.0–0.2)

## 2022-03-17 LAB — LACTATE DEHYDROGENASE: LDH: 162 U/L (ref 98–192)

## 2022-03-17 LAB — FERRITIN: Ferritin: 451 ng/mL — ABNORMAL HIGH (ref 11–307)

## 2022-03-17 NOTE — Telephone Encounter (Signed)
-----   Message from [name], MD sent at 03/17/2022  1:55 PM EDT ----- Call let her know that the iron level is okay.  Laurey Arrow

## 2022-03-17 NOTE — Telephone Encounter (Signed)
As noted below by Dr. Ennever, I informed the patient that her iron level is OK. She verbalized understanding. °

## 2022-03-17 NOTE — Progress Notes (Signed)
Hematology and Oncology Follow Up Visit  [name] [id number] [birth year] 71 y.o. 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 Aromasin 25 mg p.o. daily - on hold -- start on 03/18/2021 -- hold on 01/02/1022 2. Aspirin 81 mg p.o. daily 3.  Feraheme-510 mg IV as needed.  Last dose given on 08/23/2021 4.  Aranesp 300 mcg sq for Hgb less than 11 -- start on 02/17/2022   Interim History:  [name] is here today for follow-up.  Finally, we got her hemoglobin up.  The Aranesp was the key by 4.  Her hemoglobin is up to 12 now.  She feels better.  She has more energy.  There is been no problems with bleeding.  As always, her main problem has been the arthritis.  She has pretty bad arthritis.  Mostly bothers her right knee.  She is still off Aromasin.  We will keep her off the Aromasin for right now.  She has had no change in bowel or bladder habits.  She has had no cough or shortness of breath.  She has had no rashes.  There has been no swelling in her legs.  She has chronic lymphedema in her right arm.  Overall, I would say performance status is probably ECOG 1.    Medications:  Allergies as of 03/17/2022       Reactions   Contrast Media [iodinated Contrast Media] Hives, Shortness Of Breath, Nausea And Vomiting   Allergic reaction even after pre-meds.   Metrizamide Hives, Shortness Of Breath, Nausea And Vomiting   Allergic reaction even after pre-meds.   Other Shortness Of Breath, Nausea And Vomiting, Rash   Allergic reaction even after pre-meds.   Dye Fdc Blue [brilliant Blue Fcf (fd&c Blue #1)] Hives, Nausea Only   Fd&c Blue #1 (brilliant Blue Fcf) Hives, Nausea Only        Medication List        Accurate as of March 17, 2022 11:06 AM. If you have any questions, ask your nurse or doctor.           STOP taking these medications    ibuprofen 200 MG tablet Commonly known as: ADVIL Stopped by: [name], MD       TAKE these medications    amLODipine 10 MG tablet Commonly known as: NORVASC TAKE 1 TABLET BY MOUTH DAILY   aspirin EC 81 MG tablet Take 81 mg by mouth daily.   candesartan 32 MG tablet Commonly known as: ATACAND Take 32 mg by mouth daily.   celecoxib 200 MG capsule Commonly known as: CELEBREX Take 200 mg by mouth daily as needed.   exemestane 25 MG tablet Commonly known as: AROMASIN TAKE 1 TABLET BY MOUTH EVERY DAY   hydrochlorothiazide 25 MG tablet Commonly known as: HYDRODIURIL Take 25 mg by mouth daily.   metoprolol succinate 50 MG 24 hr tablet Commonly known as: TOPROL-XL Take 50 mg by mouth daily.   Santyl 250 UNIT/GM ointment Generic drug: collagenase Apply topically daily.   simvastatin 20 MG tablet Commonly known as: ZOCOR Take 20 mg by mouth daily.   Vitamin D (Ergocalciferol) 1.25 MG (50000 UNIT) Caps capsule Commonly known as: DRISDOL TAKE 1 CAPSULE BY MOUTH ONE TIME PER WEEK        Allergies:  Allergies  Allergen  Reactions   Contrast Media [Iodinated Contrast Media] Hives, Shortness Of Breath and Nausea And Vomiting    Allergic reaction even after pre-meds.   Metrizamide Hives, Shortness Of Breath and Nausea And Vomiting    Allergic reaction even after pre-meds.   Other Shortness Of Breath, Nausea And Vomiting and Rash    Allergic reaction even after pre-meds.   Dye Fdc Blue [Brilliant Blue Fcf (Fd&C Blue #1)] Hives and Nausea Only   Fd&C Blue #1 (Brilliant Blue Fcf) Hives and Nausea Only    Past Medical History, Surgical history, Social history, and Family History were reviewed and updated.  Review of Systems: Review of Systems  Constitutional: Negative.   HENT:  Positive for hearing loss.   Eyes:  Positive for discharge.  Respiratory: Negative.    Cardiovascular: Negative.   Gastrointestinal:  Negative.   Genitourinary: Negative.   Musculoskeletal: Negative.   Skin: Negative.   Neurological: Negative.   Endo/Heme/Allergies: Negative.   Psychiatric/Behavioral: Negative.      Physical Exam:  height is '5\' 4"'$  (1.626 m) and weight is 187 lb (84.8 kg). Her oral temperature is 98.3 F (36.8 C). Her blood pressure is 159/63 (abnormal) and her pulse is 79. Her respiration is 20 and oxygen saturation is 100%.   Wt Readings from Last 3 Encounters:  03/17/22 187 lb (84.8 kg)  02/13/22 186 lb (84.4 kg)  01/02/22 180 lb 6.4 oz (81.8 kg)   Physical Exam Vitals reviewed.  Constitutional:      Comments: On her breast exam, her left breast is without any masses, edema or erythema.  There is no left axillary adenopathy.  There is no discharge from the nipple on the left breast.  Right chest wall shows radiation dermatitis which is about the same.  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  There is no obvious exudate.  There is no tenderness.  There is no right axillary adenopathy.  HENT:     Head: Normocephalic and atraumatic.  Eyes:     Pupils: Pupils are equal, round, and reactive to light.     Comments: Her ocular exam does show this stye on the upper eyelid of the left eye.  There is an area of purulent material that looks like he might be coming out soon.  She has good extraocular muscle movement.  Pupils react appropriately.  Cardiovascular:     Rate and Rhythm: Normal rate and regular rhythm.     Heart sounds: Normal heart sounds.  Pulmonary:     Effort: Pulmonary effort is normal.     Breath sounds: Normal breath sounds.  Abdominal:     General: Bowel sounds are normal.     Palpations: Abdomen is soft.  Musculoskeletal:        General: No tenderness or deformity. Normal range of motion.     Cervical back: Normal range of motion.  Lymphadenopathy:     Cervical: No cervical adenopathy.  Skin:    General: Skin is warm and dry.     Findings: No erythema or  rash.  Neurological:     Mental Status: She is alert and oriented to person, place, and time.  Psychiatric:        Behavior: Behavior normal.        Thought Content: Thought content normal.        Judgment: Judgment normal.      Lab Results  Component Value Date   WBC 6.1 03/17/2022   HGB 12.0 03/17/2022  HCT 38.9 03/17/2022   MCV 89.8 03/17/2022   PLT 204 03/17/2022   Lab Results  Component Value Date   FERRITIN 506 (H) 02/13/2022   IRON 61 02/13/2022   TIBC 262 02/13/2022   UIBC 201 02/13/2022   IRONPCTSAT 23 02/13/2022   Lab Results  Component Value Date   RETICCTPCT 1.5 02/13/2022   RBC 4.33 03/17/2022   RETICCTABS 55.2 06/20/2011   No results found for: KPAFRELGTCHN, LAMBDASER, KAPLAMBRATIO No results found for: Kandis Cocking [facility] Lab Results  Component Value Date   TOTALPROTELP 6.6 08/14/2010   ALBUMINELP 53.8 (L) 08/14/2010   A1GS 5.3 (H) 08/14/2010   A2GS 14.3 (H) 08/14/2010   BETS 5.8 08/14/2010   BETA2SER 6.3 08/14/2010   GAMS 14.5 08/14/2010   MSPIKE NOT DET 08/14/2010   SPEI * 08/14/2010     Chemistry      Component Value Date/Time   NA 143 03/17/2022 0947   NA 146 (H) 07/23/2017 0900   NA 144 02/18/2017 0901   K 3.8 03/17/2022 0947   K 3.7 07/23/2017 0900   K 3.7 02/18/2017 0901   CL 105 03/17/2022 0947   CL 107 07/23/2017 0900   CO2 30 03/17/2022 0947   CO2 31 07/23/2017 0900   CO2 27 02/18/2017 0901   BUN 23 03/17/2022 0947   BUN 16 07/23/2017 0900   BUN 17.7 02/18/2017 0901   CREATININE 0.76 03/17/2022 0947   CREATININE 0.8 07/23/2017 0900   CREATININE 0.7 02/18/2017 0901      Component Value Date/Time   CALCIUM 9.5 03/17/2022 0947   CALCIUM 9.3 07/23/2017 0900   CALCIUM 9.3 02/18/2017 0901   ALKPHOS 81 03/17/2022 0947   ALKPHOS 67 07/23/2017 0900   ALKPHOS 78 02/18/2017 0901   AST 14 (L) 03/17/2022 0947   AST 13 02/18/2017 0901   ALT 12 03/17/2022 0947   ALT 20 07/23/2017 0900   ALT 12 02/18/2017 0901   BILITOT 0.3  03/17/2022 0947   BILITOT 0.54 02/18/2017 0901       Impression and Plan: Ms. Melichar is a very pleasant 71 yo caucasian female with history of locally recurrent adenocarcinoma of the right breast with resection and radiation.   Again, the Aranesp was critical here.  Also happy that this helped her.  She does not need any Aranesp today.  We will see what her iron studies look like.  Hopefully they should be okay.  I think we can now get her back after the July 4 holiday.  Hopefully, we can move her appointments out a little bit depending on what her hemoglobin shows.    [name], MD 6/5/202311:06 AM

## 2022-03-19 ENCOUNTER — Other Ambulatory Visit: Payer: Self-pay

## 2022-03-19 MED ORDER — VITAMIN D (ERGOCALCIFEROL) 1.25 MG (50000 UNIT) PO CAPS: ORAL_CAPSULE | ORAL | 3 refills | Status: DC

## 2022-03-25 NOTE — Progress Notes (Signed)
GARDENIA, WITTER ([id number]) Visit Report for 02/26/2022 Arrival Information Details Patient Name: Date of Service: [name] 02/26/2022 9:30 A M Medical Record Number: [id number] Patient Account Number: [id number] Date of Birth/Sex: Treating RN: Jun 18, 1951 (71 y.o. [name] Primary Care Xsavier Seeley: [name] Other Clinician: Referring Saabir Blyth: Treating Louetta Hollingshead/Extender: [name], [name]: 75 Visit Information History Since Last Visit Added or deleted any medications: No Patient Arrived: Ambulatory Any new allergies or adverse reactions: No Arrival Time: 09:09 Had a fall or experienced change in No Accompanied By: self activities of daily living that may affect Transfer Assistance: None risk of falls: Patient Identification Verified: Yes Signs or symptoms of abuse/neglect since last visito No Secondary Verification Process Completed: Yes Hospitalized since last visit: No Patient Requires Transmission-Based Precautions: No Implantable device outside of the clinic excluding No Patient Has Alerts: No cellular tissue based products placed in the center since last visit: Has Dressing in Place as Prescribed: Yes Has Compression in Place as Prescribed: No Pain Present Now: No Electronic Signature(s) Signed: 03/25/2022 8:46:54 AM By: Erenest Blank Entered By: Erenest Blank on 02/26/2022 09:12:14 -------------------------------------------------------------------------------- Encounter Discharge Information Details Patient Name: Date of Service: [name], [name] 02/26/2022 9:30 A M Medical Record Number: [id number] Patient Account Number: [id number] Date of Birth/Sex: Treating RN: October 08, 1951 (71 y.o. [name] Primary Care Evony Rezek: [name] Other Clinician: Referring Jamelah Sitzer: Treating Dorse Locy/Extender: [name], [name]: 21 Encounter Discharge Information Items Post Procedure  Vitals Discharge Condition: Stable Temperature (F): 98.2 Ambulatory Status: Ambulatory Pulse (bpm): 90 Discharge Destination: Home Respiratory Rate (breaths/min): 20 Transportation: Private Auto Blood Pressure (mmHg): 175/76 Accompanied By: self Schedule Follow-up Appointment: Yes Clinical Summary of Care: Electronic Signature(s) Signed: 02/26/2022 4:50:23 PM By: [name] RN, BSN Entered By: [name] on 02/26/2022 09:38:11 -------------------------------------------------------------------------------- Lower Extremity Assessment Details Patient Name: Date of Service: [name], [name] 02/26/2022 9:30 A M Medical Record Number: [id number] Patient Account Number: [id number] Date of Birth/Sex: Treating RN: 1951-10-07 (71 y.o. [name] Primary Care Camber Ninh: [name] Other Clinician: Referring Porfirio Bollier: Treating Mekiyah Gladwell/Extender: [name], [name]: 27 Electronic Signature(s) Signed: 02/26/2022 4:30:40 PM By: Lorrin Jackson Signed: 03/25/2022 8:46:54 AM By: Erenest Blank Entered By: Erenest Blank on 02/26/2022 09:12:59 -------------------------------------------------------------------------------- Multi-Disciplinary Care Plan Details Patient Name: Date of Service: [name], [name] 02/26/2022 9:30 A M Medical Record Number: [id number] Patient Account Number: [id number] Date of Birth/Sex: Treating RN: 1950-10-29 (71 y.o. [name] Primary Care Koryn Charlot: [name] Other Clinician: Referring Anatole Apollo: Treating Navraj Dreibelbis/Extender: [name], [name] in Treatment: 90 [place] reviewed with physician Active Inactive Necrotic Tissue Nursing Diagnoses: Knowledge deficit related to management of necrotic/devitalized tissue Goals: Necrotic/devitalized tissue will be minimized in the wound bed Date Initiated: 07/10/2021 Target Resolution Date: 04/04/2022 Goal Status:  Active Interventions: Provide education on necrotic tissue and debridement process Treatment Activities: Excisional debridement : 07/10/2021 Notes: 01/01/22: Continues to use Santyl Wound/Skin Impairment Nursing Diagnoses: Impaired tissue integrity Knowledge deficit related to ulceration/compromised skin integrity Goals: Patient/caregiver will verbalize understanding of skin care regimen Date Initiated: 08/22/2020 Target Resolution Date: 04/11/2022 Goal Status: Active Ulcer/skin breakdown will have a volume reduction of 30% by week 4 Date Initiated: 08/22/2020 Date Inactivated: 10/03/2020 Target Resolution Date: 09/19/2020 Goal Status: Met Ulcer/skin breakdown will have a volume reduction of 50% by week 8 Date Initiated: 06/05/2021 Date Inactivated: 07/17/2021 Target Resolution Date: 07/03/2021 Goal Status: Unmet Unmet Reason:  infection Interventions: Assess patient/caregiver ability to obtain necessary supplies Assess patient/caregiver ability to perform ulcer/skin care regimen upon admission and as needed Assess ulceration(s) every visit Treatment Activities: Skin care regimen initiated : 08/22/2020 Topical wound management initiated : 08/22/2020 Notes: 06/05/21: Wound care regimen ongoing. Electronic Signature(s) Signed: 02/26/2022 4:50:23 PM By: [name] RN, BSN Entered By: [name] on 02/26/2022 09:30:46 -------------------------------------------------------------------------------- Pain Assessment Details Patient Name: Date of Service: [name], [name] 02/26/2022 9:30 A M Medical Record Number: [id number] Patient Account Number: [id number] Date of Birth/Sex: Treating RN: 1951/01/29 (71 y.o. [name] Primary Care Rosabell Geyer: [name] Other Clinician: Referring Bethani Brugger: Treating Karizma Cheek/Extender: [name], [name]: 79 Active Problems Location of Pain Severity and Description of Pain Patient Has Paino No Site  Locations Pain Management and Medication Current Pain Management: Electronic Signature(s) Signed: 02/26/2022 4:30:40 PM By: Lorrin Jackson Signed: 03/25/2022 8:46:54 AM By: Erenest Blank Entered By: Erenest Blank on 02/26/2022 09:12:50 -------------------------------------------------------------------------------- Patient/Caregiver Education Details Patient Name: Date of Service: [name], [name] 5/17/2023andnbsp9:30 A M Medical Record Number: [id number] Patient Account Number: [id number] Date of Birth/Gender: Treating RN: Apr 10, 1951 (71 y.o. [name] Primary Care Physician: [name] Other Clinician: Referring Physician: Treating Physician/Extender: [name], [name] in Treatment: 70 Education Assessment Education Provided To: Patient Education Topics Provided Wound/Skin Impairment: Handouts: Skin Care Do's and Dont's Methods: Explain/Verbal Responses: Reinforcements needed Electronic Signature(s) Signed: 02/26/2022 4:50:23 PM By: [name] RN, BSN Entered By: [name] on 02/26/2022 09:31:02 -------------------------------------------------------------------------------- Wound Assessment Details Patient Name: Date of Service: [name], [name] 02/26/2022 9:30 A M Medical Record Number: [id number] Patient Account Number: [id number] Date of Birth/Sex: Treating RN: May 02, 1951 (71 y.o. [name] Primary Care Amirrah Quigley: [name] Other Clinician: Referring Robbi Spells: Treating Jahnaya Branscome/Extender: [name], [name]: 79 Wound Status Wound Number: 1 Primary Soft Tissue Radionecrosis Etiology: Wound Location: Right Breast (mastectomy site) Wound Open Wounding Event: Radiation Burn Status: Date Acquired: 07/26/2020 Comorbid Anemia, Lymphedema, Deep Vein Thrombosis, Hypertension, Weeks Of Treatment: 79 History: Peripheral Venous Disease, Osteoarthritis, Received Clustered Wound: No  Chemotherapy, Received Radiation, Confinement Anxiety Photos Wound Measurements Length: (cm) 1.7 Width: (cm) 2.6 Depth: (cm) 0.5 Area: (cm) 3.471 Volume: (cm) 1.736 Wound Description Classification: Full Thickness Without Exposed Support Struct Wound Margin: Distinct, outline attached Exudate Amount: Medium Exudate Type: Serous Exudate Color: amber ures Foul Odor After Cleansing: Slough/Fibrino % Reduction in Area: -126.6% % Reduction in Volume: -1034.6% Epithelialization: None Tunneling: No Undermining: No No Yes Wound Bed Granulation Amount: Medium (34-66%) Exposed Structure Granulation Quality: Pink Fascia Exposed: No Necrotic Amount: Medium (34-66%) Fat Layer (Subcutaneous Tissue) Exposed: Yes Necrotic Quality: Adherent Slough Tendon Exposed: No Muscle Exposed: No Joint Exposed: No Bone Exposed: No Treatment Notes Wound #1 (Breast (mastectomy site)) Wound Laterality: Right Cleanser Soap and Water Discharge Instruction: May shower and wash wound with dial antibacterial soap and water prior to dressing change. Peri-Wound Care Skin Prep Discharge Instruction: Use skin prep as directed Topical Primary Dressing Santyl Ointment Discharge Instruction: Apply nickel thick amount to wound bed as instructed Secondary Dressing Cosmopor Wound Dressing, 4x4 (in/in) (Border Gauze) Discharge Instruction: Apply bordered gauze or foam border over primary dressing. Woven Gauze Sponge, Non-Sterile 4x4 in Discharge Instruction: Apply over primary dressing as directed. Woven Gauze Sponges 2x2 in Discharge Instruction: Apply over primary dressing, fold in corners to fill the space Secured With Compression Wrap Compression Stockings Add-Ons Electronic Signature(s) Signed: 02/26/2022 4:30:40 PM By: Onnie Boer,  Leveda Anna Signed: 03/25/2022 8:46:54 AM By: Erenest Blank Entered By: Erenest Blank on 02/26/2022  09:20:45 -------------------------------------------------------------------------------- Vitals Details Patient Name: Date of Service: [name], [name] 02/26/2022 9:30 A M Medical Record Number: [id number] Patient Account Number: [id number] Date of Birth/Sex: Treating RN: [birth year] (71 y.o. [name] Primary Care Orel Hord: [name] Other Clinician: Referring Nishawn Rotan: Treating Gilbert Narain/Extender: [name], [name]: 79 Vital Signs Time Taken: 09:12 Temperature (F): 98.2 Height (in): 63 Pulse (bpm): 90 Weight (lbs): 176 Respiratory Rate (breaths/min): 18 Body Mass Index (BMI): 31.2 Blood Pressure (mmHg): 175/76 Reference Range: 80 - 120 mg / dl Electronic Signature(s) Signed: 03/25/2022 8:46:54 AM By: Erenest Blank Entered By: Erenest Blank on 02/26/2022 09:12:41

## 2022-03-26 ENCOUNTER — Encounter (HOSPITAL_BASED_OUTPATIENT_CLINIC_OR_DEPARTMENT_OTHER): Payer: Medicare Other | Attending: General Surgery | Admitting: Physician Assistant

## 2022-03-26 DIAGNOSIS — I890 Lymphedema, not elsewhere classified: Secondary | ICD-10-CM | POA: Insufficient documentation

## 2022-03-26 DIAGNOSIS — L98492 Non-pressure chronic ulcer of skin of other sites with fat layer exposed: Secondary | ICD-10-CM | POA: Diagnosis not present

## 2022-03-26 DIAGNOSIS — Z9221 Personal history of antineoplastic chemotherapy: Secondary | ICD-10-CM | POA: Diagnosis not present

## 2022-03-26 DIAGNOSIS — L589 Radiodermatitis, unspecified: Secondary | ICD-10-CM | POA: Diagnosis not present

## 2022-03-26 DIAGNOSIS — Z86718 Personal history of other venous thrombosis and embolism: Secondary | ICD-10-CM | POA: Insufficient documentation

## 2022-03-26 DIAGNOSIS — L598 Other specified disorders of the skin and subcutaneous tissue related to radiation: Secondary | ICD-10-CM | POA: Insufficient documentation

## 2022-03-26 DIAGNOSIS — Z9011 Acquired absence of right breast and nipple: Secondary | ICD-10-CM | POA: Diagnosis not present

## 2022-03-26 DIAGNOSIS — I10 Essential (primary) hypertension: Secondary | ICD-10-CM | POA: Diagnosis not present

## 2022-03-26 DIAGNOSIS — Z853 Personal history of malignant neoplasm of breast: Secondary | ICD-10-CM | POA: Insufficient documentation

## 2022-03-26 NOTE — Progress Notes (Addendum)
[name], [name] ([id number]) Visit Report for 03/26/2022 Chief Complaint Document Details Patient Name: Date of Service: [name], [name] YE 03/26/2022 9:30 A M Medical Record Number: [id number] Patient Account Number: [id number] Date of Birth/Sex: Treating RN: 1951-05-07 (71 y.o. 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 [name] Primary Care Provider: [name] Other Clinician: Referring Provider: Treating Provider/Extender: [name], [name]: 83 Debridement Performed for Assessment: Wound #1 Right Breast (mastectomy site) Performed By: Physician Worthy Keeler, PA Debridement Type: Debridement Level of Consciousness (Pre-procedure): Awake and Alert Pre-procedure Verification/Time Out Yes - 09:57 Taken: Start Time: 09:58 Pain Control: Other : Benzocaine T Area Debrided (L x W): otal 1.4 (cm) x 3.2 (cm) = 4.48 (cm) Tissue and other material debrided: Non-Viable, Slough, Subcutaneous, Biofilm, Slough Level: Skin/Subcutaneous Tissue Debridement Description: Excisional Instrument: Curette Bleeding: Minimum Hemostasis Achieved: Pressure End Time: 10:02 Response to Treatment: Procedure was tolerated well Level of Consciousness (Post- Awake and Alert procedure): Post Debridement  Measurements of Total Wound Length: (cm) 1.4 Width: (cm) 3.2 Depth: (cm) 0.8 Volume: (cm) 2.815 Character of Wound/Ulcer Post Debridement: Stable Post Procedure Diagnosis Same as Pre-procedure Electronic Signature(s) Signed: 03/26/2022 5:11:12 PM By: Worthy Keeler PA-C Signed: 03/26/2022 5:21:51 PM By: Lorrin Jackson Entered By: Lorrin Jackson on 03/26/2022 10:02:51 -------------------------------------------------------------------------------- HPI Details Patient Name: Date of Service: [name], [name] 03/26/2022 9:30 A M Medical Record Number: [id number] Patient Account Number: [id number] Date of Birth/Sex: Treating RN: April 19, 1951 (71 y.o. [name] Primary Care Provider: [name] Other Clinician: Referring Provider: Treating Provider/Extender: [name], [name]: 70 History of Present Illness HPI Description: 08/22/2020 upon evaluation today patient actually appears to be doing somewhat poorly in regard to her mastectomy site on the right chest wall. She had a mastectomy initially in 1998. She had chemotherapy only no radiation following. In 2002 she subsequently did undergo radiation for the first time and then subsequently in 2012 had repeat radiation after having had a finding that was consistent with a relapse of the cancer. 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 She also has radiation damage to the skin over the right chest wall where she had the mastectomy. She was referred to [place] by Dr. Matthew Saras in [facility] and her oncologist is Dr. Burney Gauze. Subsequently I want to make sure before we delve into this more deeply that the patient does not have any issues with a return of cancer that needs to be managed by them. Obviously she does have significant scar tissue which may be benefited secondary to the soft tissue radionecrosis by hyperbaric oxygen therapy in the absence  of any recurrence of the cancer. Nonetheless she does not remember exactly when her last PET scan was but it was not too recently she tells me. 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 The patient does have hypertension as well. She sees her oncologist next on December 6.  09/12/2020 on evaluation today patient actually appears to be doing excellent in regard to her wound under the right breast location. Currently this is measuring smaller in general seems to be doing very well. She tells me that the collagen is doing a good job and that the dressing that she has been putting on is not causing any troubles as far pulling on her skin or scar tissue is concerned all of which is excellent news. 10/03/2020 upon evaluation today patient appears to be doing well with regard to her surgical site from her mastectomy on the right breast region. She tells me that she has had very little bleeding nothing seems to be sticking badly and in general she has been extremely pleased with where things stand today. No fevers, chills, nausea, vomiting, or diarrhea. 10/24/2020 upon evaluation today patient actually appears to be doing excellent in regard to her wound. There is just a very small area still open and to be honest there was minimal slough noted on the surface of the wound nothing that requires sharp debridement. In general I feel like she is actually doing quite well and overall I am pleased. I do think we may switch to silver alginate dressing and also contemplating using a AandE ointment in order to help keep everything moist and the scar tissue region while the alginate keeps it dry enough to heal 11/14/2020 upon evaluation today patient appears to be doing well at this point in regard to her wound. I do feel like that she is making good progress again this is a very difficult region over the surgical site of the right chest wall at the site of mastectomy. Nonetheless I think that we are just a  very small area still open I think alginate is doing a good job helping to dry this up and I would recommend this such that we continue with this. 01/02/2021 on evaluation today patient's wound actually appears to be doing decently well. There does not appear to be any signs of active infection which is great news. She does have some slight slough buildup with biofilm on the surface of the wound mainly more biofilm. Nonetheless I was able to gently remove this with saline and gauze as well as a sterile Q-tip. She tolerated that today without complication. 01/30/2021 upon evaluation today patient appears to be doing well with regard to her wound although she still has quite a bit of trouble getting this to close I think the scar tissue and radiation damage is quite significant here unfortunately. There does not appear to be any signs of active infection which is great news. No fevers, chills, nausea, vomiting, or diarrhea. 02/27/2021 upon evaluation today patient appears to be doing excellent in regard to her wound. She has been tolerating the dressing changes without complication and in general I am extremely pleased with where things stand today. There does not appear to be any signs of infection which is great news. No fevers, chills, nausea, vomiting, or diarrhea. With that being said I do think that she still developing some slough buildup. I did discuss with her today the possibility of proceeding with HBO therapy. We have had this discussion before but she really is not quite committed to wanting to do that much and spend that much time in the chamber. She wants to still think about it. 03/27/2021 upon evaluation today patient appears to be doing about the same in regard to her wound. She still developing a lot of slough buildup on  the surface of the wound. I did try to clean that away to some degree today. She tolerated that without any pain or complication. Subsequently I am thinking we may  want to switch over to [facility] see if this may do better for her. Patient is in agreement with giving that a trial. 05/01/2021 upon evaluation today patient appears to be doing about the same in regard to her wounds. She has been tolerating the dressing changes without complication. Fortunately there does not appear to be any signs of active infection at this time which is great news. No fevers, chills, nausea, vomiting, or diarrhea.. 06/05/2021 upon evaluation today patient appears to be doing pretty well currently in regard to her wound at the mastectomy site right chest wall. Overall since we switch back to the alginate she tells me things have significantly improved she is much happier with where things stand currently. Fortunately there does not appear to be any signs of active infection which is great news. No fevers, chills, nausea, vomiting, or diarrhea. 07/10/2021 upon evaluation today patient unfortunately does not appear to be doing nearly as well as what she previously was. There does not appear to be any evidence of new epithelial growth she has a lot of necrotic tissue the base of the wound this is much more significant than what we previously noted. She also has been having increased pain and increased drainage. In general I am very concerned about this especially in light of the fact that she does have a history of breast cancer I want to ensure that were not looking at any cancerous type lesion at this point. Otherwise also think she does need some debridement we did do MolecuLight scanning today. 07/17/2021 upon evaluation today patient appears to be doing well with regard to her wound compared to last week although she still having some erythema I am concerned in this regard. I do think that she fortunately had a negative biopsy which is great news unfortunately she did have Pseudomonas noted as a bacteria present in the culture which I think is good and need to be addressed with  Levaquin. I Minna send that into the pharmacy today. We did repeat the MolecuLight screening. 07/31/2021 upon evaluation today patient's wound is actually showing signs of improvement which is great news. Fortunately there does not appear to be any signs of infection currently locally nor systemically and I am very pleased in that regard. With that being said the patient is having some issues here with continued necrotic tissue I think packing with the Dakin's moistened gauze dressing is still in the right leg ago. 08/14/2021 upon evaluation today patient appears to be doing well with regard to her wound all things considered I do not see any signs of significant infection which is great news. Fortunately there does not appear to be any evidence of infection which is also great news. In general I think that the patient is tolerating the dressing changes well. We been using a Dakin's moistened gauze. 08/28/2021 upon evaluation today patient appears to be doing well with regard to her wound. Worsening signs of definite improvement which is great news and overall very pleased with where we stand today. There is no signs of inflamed tissue or infection which is great as well and I think the Dakin's is doing a great job here. 09/11/2021 upon evaluation today patient appears to be doing well with regard to her wound all things considered. I am can perform some debridement today to  clear away some of the necrotic debris with that being said overall I think that she is making decently good progress here which is great news. There does not appear to be any signs of active infection also good news. That in fact is probably the best news in her mind. 09/25/2021 upon evaluation today patient appears to be doing decently well in regard to her wound. Overall I do not see any signs of infection and I think she is doing quite well. I do believe that we are making headway here although is very slow due to the scarring  and the depth of the wound this is a significantly deep wound. 10/16/2021 upon evaluation today patient appears to be doing about the same in regard to the wound on her right chest wall. Fortunately there does not appear to be any signs of active infection locally nor systemically which is great news. With that being said this still was not cleaning up quite as quickly as I would like to see. Fortunately I think that we can definitely give the Santyl another try we tried this in the past and unfortunately did not have a good result but again I think she was infected at that time as well which was part of the issue. Nonetheless I think currently that we will be able to go ahead and see what we can do about getting this little cleaner with the Santyl. 11/06/2021 upon evaluation today patient appears to be doing well with regard to her wound. This actually appears to be a little bit better with the Santyl I am happy in that regard. Fortunately I do not see any signs of active infection at this time. No fevers, chills, nausea, vomiting, or diarrhea. 12/04/2021 upon evaluation patient appears to be doing well with the Santyl at this point. I am very pleased with where we stand and I think that she is making good progress here. Fortunately I do not see any evidence of active infection locally or systemically at this time which is great news. No fevers, chills, nausea, vomiting, or diarrhea. 01/01/2022 upon evaluation today patient actually is making excellent progress with the Santyl. I am extremely pleased with where we stand today. I do not see any signs of infection. 01-29-2022 upon evaluation today patient appears to be doing well with regard to her wound. This is showing signs of some improvement. It is a little less deep than what it was. Size wise is also slightly smaller but again there still is a significant wound in this area. Fortunately I do not see any evidence of infection and she is doing quite  well. 02-26-2022 upon evaluation today patient appears to be doing well with regard to her wound. In fact this is actually the best that I have seen in a very long time. I do not see any evidence of active infection at this time locally or systemically which is great news and overall I think we are on the right track. Nonetheless I do believe that the patient is continuing to show evidence of improvement with the Santyl week by week. 03-26-2022 upon evaluation today patient appears to be doing well with regard to the wound on her right mastectomy site. She is actually showing signs of excellent improvement in fact there was some bleeding just with a little bit of light cleaning over the area. Fortunately I do not see any signs of active infection locally or systemically at this time which is great news. Electronic Signature(s) Signed:  03/26/2022 10:04:17 AM By: Worthy Keeler PA-C Entered By: Worthy Keeler on 03/26/2022 10:04:17 -------------------------------------------------------------------------------- Physical Exam Details Patient Name: Date of Service: [name], [name] 03/26/2022 9:30 A M Medical Record Number: [id number] Patient Account Number: [id number] Date of Birth/Sex: Treating RN: 1950/10/27 (71 y.o. [name] Primary Care Provider: [name] Other Clinician: Referring Provider: Treating Provider/Extender: [name], [name]: 31 Constitutional [name] and well-hydrated in no acute distress. Respiratory normal breathing without difficulty. Psychiatric this patient is able to make decisions and demonstrates good insight into disease process. Alert and Oriented x 3. pleasant and cooperative. Notes Patient's wound did require sharp debridement of clearway some of the necrotic debris she tolerated that today without complication and postdebridement wound bed appears to be doing much better overall I am extremely pleased with  where we stand today. Electronic Signature(s) Signed: 03/26/2022 10:05:03 AM By: Worthy Keeler PA-C Entered By: Worthy Keeler on 03/26/2022 10:05:03 -------------------------------------------------------------------------------- Physician Orders Details Patient Name: Date of Service: [name], [name] 03/26/2022 9:30 A M Medical Record Number: [id number] Patient Account Number: [id number] Date of Birth/Sex: Treating RN: 08/16/1951 (71 y.o. [name] Primary Care Provider: [name] Other Clinician: Referring Provider: Treating Provider/Extender: [name], [name]: 27 Verbal / Phone Orders: No Diagnosis Coding ICD-10 Coding Code Description [phone] Non-pressure chronic ulcer of skin of other sites with fat layer exposed L58.9 Radiodermatitis, unspecified L59.8 Other specified disorders of the skin and subcutaneous tissue related to radiation I10 Essential (primary) hypertension Follow-up Appointments Return appointment in 1 month. Delfin Gant, RN (Room 7) 04/30/2022 @ 9:30 Bathing/ Shower/ Hygiene May shower and wash wound with soap and water. - on days that dressing is changed Additional Orders / Instructions Follow Nutritious Diet Wound Treatment Wound #1 - Breast (mastectomy site) Wound Laterality: Right Cleanser: Soap and Water 1 x Per Day/30 Days Discharge Instructions: May shower and wash wound with dial antibacterial soap and water prior to dressing change. Peri-Wound Care: Skin Prep (Generic) 1 x Per Day/30 Days Discharge Instructions: Use skin prep as directed Prim Dressing: Santyl Ointment 1 x Per Day/30 Days ary Discharge Instructions: Apply nickel thick amount to wound bed as instructed Secondary Dressing: Cosmopor Wound Dressing, 4x4 (in/in) (Border Gauze) (Generic) 1 x Per Day/30 Days Discharge Instructions: Apply bordered gauze or foam border over primary dressing. Secondary Dressing: Woven Gauze Sponge,  Non-Sterile 4x4 in (Generic) 1 x Per Day/30 Days Discharge Instructions: Apply over primary dressing as directed. Secondary Dressing: Woven Gauze Sponges 2x2 in (Generic) 1 x Per Day/30 Days Discharge Instructions: Apply over primary dressing, fold in corners to fill the space Electronic Signature(s) Signed: 03/26/2022 5:11:12 PM By: Worthy Keeler PA-C Signed: 03/26/2022 5:21:51 PM By: Lorrin Jackson Entered By: Lorrin Jackson on 03/26/2022 10:04:57 -------------------------------------------------------------------------------- Problem List Details Patient Name: Date of Service: [name], [name] 03/26/2022 9:30 A M Medical Record Number: [id number] Patient Account Number: [id number] Date of Birth/Sex: Treating RN: 1950-12-11 (71 y.o. [name] Primary Care Provider: [name] Other Clinician: Referring Provider: Treating Provider/Extender: [name], [name]: 66 Active Problems ICD-10 Encounter Code Description Active Date MDM Diagnosis L98.492 Non-pressure chronic ulcer of skin of other sites with fat layer exposed 08/22/2020 No Yes L58.9 Radiodermatitis, unspecified 08/22/2020 No Yes L59.8 Other specified disorders of the skin and subcutaneous tissue related to 08/22/2020 No Yes radiation I10 Essential (primary) hypertension 08/22/2020 No Yes Inactive Problems Resolved  Problems Electronic Signature(s) Signed: 03/26/2022 9:07:41 AM By: Worthy Keeler PA-C Entered By: Worthy Keeler on 03/26/2022 09:07:41 -------------------------------------------------------------------------------- Progress Note Details Patient Name: Date of Service: [name], [name] 03/26/2022 9:30 A M Medical Record Number: [id number] Patient Account Number: [id number] Date of Birth/Sex: Treating RN: 1950-12-03 (71 y.o. 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 She had a mastectomy initially in 1998. She had chemotherapy only no radiation following. In 2002 she subsequently did undergo radiation for the first time and then subsequently in 2012 had repeat radiation after having had a finding that was consistent with a relapse of the cancer. 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 She also has radiation damage to the skin over the right chest wall where she had the mastectomy. She was referred to [place] by Dr. Matthew Saras in [facility] and her oncologist is Dr. Burney Gauze. Subsequently I want to make sure before we delve into this more deeply that the patient does not have any issues with a return of cancer that needs to be managed by them. 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 Nonetheless she does not remember exactly when her last PET scan was but it was not too recently she tells me. 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 The patient does have hypertension as well. She sees her oncologist next on December 6. 09/12/2020 on evaluation today patient actually appears to be doing excellent in regard to her wound under the right breast location. Currently this is measuring smaller in general seems to be doing very well. She tells me that the collagen is doing a good job and that the dressing that she has been putting on is not causing any troubles as far pulling on her skin or scar tissue is  concerned all of which is excellent news. 10/03/2020 upon evaluation today patient appears to be doing well with regard to her surgical site from her mastectomy on the right breast region. She tells me that she has had very little bleeding nothing seems to be sticking badly and in general she has been extremely pleased with where things stand today. No fevers, chills, nausea, vomiting, or diarrhea. 10/24/2020 upon evaluation today patient actually appears to be doing excellent in regard to her wound. There is just a very small area still open and to be honest there was minimal slough noted on the surface of the wound nothing that requires sharp debridement. In general I feel like she is actually doing quite well and overall I am pleased. I do think we may switch to silver alginate dressing and also contemplating using a AandE ointment in order to help keep everything moist and the scar tissue region while the alginate keeps it dry enough to heal 11/14/2020 upon evaluation today patient appears to be doing well at this point in regard to her wound. I do feel like that she is making good progress again this is a very  difficult region over the surgical site of the right chest wall at the site of mastectomy. Nonetheless I think that we are just a very small area still open I think alginate is doing a good job helping to dry this up and I would recommend this such that we continue with this. 01/02/2021 on evaluation today patient's wound actually appears to be doing decently well. There does not appear to be any signs of active infection which is great news. She does have some slight slough buildup with biofilm on the surface of the wound mainly more biofilm. Nonetheless I was able to gently remove this with saline and gauze as well as a sterile Q-tip. She tolerated that today without complication. 01/30/2021 upon evaluation today patient appears to be doing well with regard to her wound although she still has  quite a bit of trouble getting this to close I think the scar tissue and radiation damage is quite significant here unfortunately. There does not appear to be any signs of active infection which is great news. No fevers, chills, nausea, vomiting, or diarrhea. 02/27/2021 upon evaluation today patient appears to be doing excellent in regard to her wound. She has been tolerating the dressing changes without complication and in general I am extremely pleased with where things stand today. There does not appear to be any signs of infection which is great news. No fevers, chills, nausea, vomiting, or diarrhea. With that being said I do think that she still developing some slough buildup. I did discuss with her today the possibility of proceeding with HBO therapy. We have had this discussion before but she really is not quite committed to wanting to do that much and spend that much time in the chamber. She wants to still think

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
## 2022-04-02 DIAGNOSIS — R7303 Prediabetes: Secondary | ICD-10-CM | POA: Diagnosis not present

## 2022-04-02 DIAGNOSIS — E782 Mixed hyperlipidemia: Secondary | ICD-10-CM | POA: Diagnosis not present

## 2022-04-03 DIAGNOSIS — L608 Other nail disorders: Secondary | ICD-10-CM | POA: Diagnosis not present

## 2022-04-09 DIAGNOSIS — I10 Essential (primary) hypertension: Secondary | ICD-10-CM | POA: Diagnosis not present

## 2022-04-09 DIAGNOSIS — Z139 Encounter for screening, unspecified: Secondary | ICD-10-CM | POA: Diagnosis not present

## 2022-04-09 DIAGNOSIS — R7303 Prediabetes: Secondary | ICD-10-CM | POA: Diagnosis not present

## 2022-04-09 DIAGNOSIS — Z0000 Encounter for general adult medical examination without abnormal findings: Secondary | ICD-10-CM | POA: Diagnosis not present

## 2022-04-09 DIAGNOSIS — Z136 Encounter for screening for cardiovascular disorders: Secondary | ICD-10-CM | POA: Diagnosis not present

## 2022-04-09 DIAGNOSIS — Z1331 Encounter for screening for depression: Secondary | ICD-10-CM | POA: Diagnosis not present

## 2022-04-09 DIAGNOSIS — E782 Mixed hyperlipidemia: Secondary | ICD-10-CM | POA: Diagnosis not present

## 2022-04-09 DIAGNOSIS — Z1339 Encounter for screening examination for other mental health and behavioral disorders: Secondary | ICD-10-CM | POA: Diagnosis not present

## 2022-04-09 DIAGNOSIS — E669 Obesity, unspecified: Secondary | ICD-10-CM | POA: Diagnosis not present

## 2022-04-09 DIAGNOSIS — Z0189 Encounter for other specified special examinations: Secondary | ICD-10-CM | POA: Diagnosis not present

## 2022-04-09 DIAGNOSIS — Z6833 Body mass index (BMI) 33.0-33.9, adult: Secondary | ICD-10-CM | POA: Diagnosis not present

## 2022-04-12 DIAGNOSIS — I10 Essential (primary) hypertension: Secondary | ICD-10-CM | POA: Diagnosis not present

## 2022-04-12 DIAGNOSIS — E782 Mixed hyperlipidemia: Secondary | ICD-10-CM | POA: Diagnosis not present

## 2022-04-18 ENCOUNTER — Encounter: Payer: Self-pay | Admitting: Hematology & Oncology

## 2022-04-18 ENCOUNTER — Inpatient Hospital Stay: Payer: Medicare Other

## 2022-04-18 ENCOUNTER — Inpatient Hospital Stay (HOSPITAL_BASED_OUTPATIENT_CLINIC_OR_DEPARTMENT_OTHER): Payer: Medicare Other | Admitting: Hematology & Oncology

## 2022-04-18 ENCOUNTER — Inpatient Hospital Stay: Payer: Medicare Other | Attending: Hematology & Oncology

## 2022-04-18 ENCOUNTER — Other Ambulatory Visit: Payer: Self-pay

## 2022-04-18 VITALS — BP 150/52 | HR 64 | Temp 98.1°F | Resp 18 | Ht 64.0 in | Wt 188.1 lb

## 2022-04-18 DIAGNOSIS — Z79811 Long term (current) use of aromatase inhibitors: Secondary | ICD-10-CM | POA: Diagnosis not present

## 2022-04-18 DIAGNOSIS — C50911 Malignant neoplasm of unspecified site of right female breast: Secondary | ICD-10-CM | POA: Insufficient documentation

## 2022-04-18 DIAGNOSIS — D638 Anemia in other chronic diseases classified elsewhere: Secondary | ICD-10-CM | POA: Insufficient documentation

## 2022-04-18 DIAGNOSIS — Z7982 Long term (current) use of aspirin: Secondary | ICD-10-CM | POA: Insufficient documentation

## 2022-04-18 DIAGNOSIS — I890 Lymphedema, not elsewhere classified: Secondary | ICD-10-CM | POA: Diagnosis not present

## 2022-04-18 DIAGNOSIS — D631 Anemia in chronic kidney disease: Secondary | ICD-10-CM | POA: Diagnosis not present

## 2022-04-18 DIAGNOSIS — C50011 Malignant neoplasm of nipple and areola, right female breast: Secondary | ICD-10-CM

## 2022-04-18 DIAGNOSIS — Z923 Personal history of irradiation: Secondary | ICD-10-CM | POA: Diagnosis not present

## 2022-04-18 DIAGNOSIS — D509 Iron deficiency anemia, unspecified: Secondary | ICD-10-CM | POA: Diagnosis not present

## 2022-04-18 LAB — CBC WITH DIFFERENTIAL (CANCER CENTER ONLY)
Abs Immature Granulocytes: 0.01 10*3/uL (ref 0.00–0.07)
Basophils Absolute: 0 10*3/uL (ref 0.0–0.1)
Basophils Relative: 1 %
Eosinophils Absolute: 0.2 10*3/uL (ref 0.0–0.5)
Eosinophils Relative: 4 %
HCT: 35.7 % — ABNORMAL LOW (ref 36.0–46.0)
Hemoglobin: 11.2 g/dL — ABNORMAL LOW (ref 12.0–15.0)
Immature Granulocytes: 0 %
Lymphocytes Relative: 35 %
Lymphs Abs: 2 10*3/uL (ref 0.7–4.0)
MCH: 27.9 pg (ref 26.0–34.0)
MCHC: 31.4 g/dL (ref 30.0–36.0)
MCV: 88.8 fL (ref 80.0–100.0)
Monocytes Absolute: 0.4 10*3/uL (ref 0.1–1.0)
Monocytes Relative: 8 %
Neutro Abs: 2.9 10*3/uL (ref 1.7–7.7)
Neutrophils Relative %: 52 %
Platelet Count: 180 10*3/uL (ref 150–400)
RBC: 4.02 MIL/uL (ref 3.87–5.11)
RDW: 14.7 % (ref 11.5–15.5)
WBC Count: 5.5 10*3/uL (ref 4.0–10.5)
nRBC: 0 % (ref 0.0–0.2)

## 2022-04-18 LAB — RETICULOCYTES
Immature Retic Fract: 7.8 % (ref 2.3–15.9)
RBC.: 4.01 MIL/uL (ref 3.87–5.11)
Retic Count, Absolute: 46.5 10*3/uL (ref 19.0–186.0)
Retic Ct Pct: 1.2 % (ref 0.4–3.1)

## 2022-04-18 LAB — CMP (CANCER CENTER ONLY)
ALT: 9 U/L (ref 0–44)
AST: 12 U/L — ABNORMAL LOW (ref 15–41)
Albumin: 4.1 g/dL (ref 3.5–5.0)
Alkaline Phosphatase: 79 U/L (ref 38–126)
Anion gap: 7 (ref 5–15)
BUN: 20 mg/dL (ref 8–23)
CO2: 30 mmol/L (ref 22–32)
Calcium: 9.3 mg/dL (ref 8.9–10.3)
Chloride: 106 mmol/L (ref 98–111)
Creatinine: 0.7 mg/dL (ref 0.44–1.00)
GFR, Estimated: >60 mL/min (ref >60)
Glucose, Bld: 111 mg/dL — ABNORMAL HIGH (ref 70–99)
Potassium: 3.7 mmol/L (ref 3.5–5.1)
Sodium: 143 mmol/L (ref 135–145)
Total Bilirubin: 0.4 mg/dL (ref 0.3–1.2)
Total Protein: 7.3 g/dL (ref 6.5–8.1)

## 2022-04-18 LAB — IRON AND IRON BINDING CAPACITY (CC-WL,HP ONLY)
Iron: 68 ug/dL (ref 28–170)
Saturation Ratios: 29 % (ref 10.4–31.8)
TIBC: 232 ug/dL — ABNORMAL LOW (ref 250–450)
UIBC: 164 ug/dL (ref 148–442)

## 2022-04-18 LAB — FERRITIN: Ferritin: 465 ng/mL — ABNORMAL HIGH (ref 11–307)

## 2022-04-18 NOTE — Progress Notes (Signed)
Hematology and Oncology Follow Up Visit  [name] [id number] 08/08/1951 71 y.o. 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 Aromasin 25 mg p.o. daily - on hold -- start on 03/18/2021 -- hold on 01/02/1022 2. Aspirin 81 mg p.o. daily 3.  Feraheme-510 mg IV as needed.  Last dose given on 08/23/2021 4.  Aranesp 300 mcg sq for Hgb less than 11 -- start on 02/17/2022   Interim History:  [name] is here today for follow-up.  She has responded very nicely to the Aranesp.  She does not need any Aranesp today which is nice.  She is not too happy about being on all these medications for her blood pressure, cholesterol, etc.  Her big problem is still arthritis.  She has a lot of problems in her knees.  She has back issues.  There is been no change in bowel or bladder habits.  She has had no problems with nausea or vomiting.  There is no issues with cough or shortness of breath.  She has had no bleeding issues.  We still are holding the Aromasin on her.  The last time that we saw her, her ferritin was 451 with an iron saturation of 21%.  Currently, I would say her performance status is probably ECOG 1.     Medications:  Allergies as of 04/18/2022       Reactions   Contrast Media [iodinated Contrast Media] Hives, Shortness Of Breath, Nausea And Vomiting   Allergic reaction even after pre-meds.   Metrizamide Hives, Shortness Of Breath, Nausea And Vomiting   Allergic reaction even after pre-meds.   Other Shortness Of Breath, Nausea And Vomiting, Rash   Allergic reaction even after pre-meds.   Dye Fdc Blue [brilliant Blue Fcf (fd&c Blue #1)] Hives, Nausea Only   Fd&c Blue #1 (brilliant Blue Fcf) Hives, Nausea Only        Medication List        Accurate as of April 18, 2022 10:23 AM. If you have  any questions, ask your nurse or doctor.          STOP taking these medications    metoprolol succinate 50 MG 24 hr tablet Commonly known as: TOPROL-XL Stopped by: [name], MD       TAKE these medications    amLODipine 10 MG tablet Commonly known as: NORVASC TAKE 1 TABLET BY MOUTH DAILY   aspirin EC 81 MG tablet Take 81 mg by mouth daily.   candesartan 32 MG tablet Commonly known as: ATACAND Take 32 mg by mouth daily.   celecoxib 200 MG capsule Commonly known as: CELEBREX Take 200 mg by mouth daily as needed.   exemestane 25 MG tablet Commonly known as: AROMASIN TAKE 1 TABLET BY MOUTH EVERY DAY   hydrochlorothiazide 25 MG tablet Commonly known as: HYDRODIURIL Take 25 mg by mouth daily.   metoprolol tartrate 100 MG tablet Commonly known as: LOPRESSOR Take 100 mg by mouth daily.   Santyl 250 UNIT/GM ointment Generic drug: collagenase Apply topically daily.   simvastatin 20 MG tablet Commonly known as: ZOCOR Take 20 mg by mouth daily.   Vitamin D (Ergocalciferol) 1.25 MG (50000 UNIT) Caps capsule Commonly known as: DRISDOL TAKE 1 CAPSULE BY MOUTH ONE TIME PER WEEK  Allergies:  Allergies  Allergen Reactions   Contrast Media [Iodinated Contrast Media] Hives, Shortness Of Breath and Nausea And Vomiting    Allergic reaction even after pre-meds.   Metrizamide Hives, Shortness Of Breath and Nausea And Vomiting    Allergic reaction even after pre-meds.   Other Shortness Of Breath, Nausea And Vomiting and Rash    Allergic reaction even after pre-meds.   Dye Fdc Blue [Brilliant Blue Fcf (Fd&C Blue #1)] Hives and Nausea Only   Fd&C Blue #1 (Brilliant Blue Fcf) Hives and Nausea Only    Past Medical History, Surgical history, Social history, and Family History were reviewed and updated.  Review of Systems: Review of Systems  Constitutional: Negative.   HENT:  Positive for hearing loss.   Eyes:  Positive for discharge.  Respiratory:  Negative.    Cardiovascular: Negative.   Gastrointestinal: Negative.   Genitourinary: Negative.   Musculoskeletal: Negative.   Skin: Negative.   Neurological: Negative.   Endo/Heme/Allergies: Negative.   Psychiatric/Behavioral: Negative.       Physical Exam:  height is '5\' 4"'$  (1.626 m) and weight is 188 lb 1.9 oz (85.3 kg). Her oral temperature is 98.1 F (36.7 C). Her blood pressure is 150/52 (abnormal) and her pulse is 64. Her respiration is 18 and oxygen saturation is 98%.   Wt Readings from Last 3 Encounters:  04/18/22 188 lb 1.9 oz (85.3 kg)  03/17/22 187 lb (84.8 kg)  02/13/22 186 lb (84.4 kg)   Physical Exam Vitals reviewed.  Constitutional:      Comments: On her breast exam, her left breast is without any masses, edema or erythema.  There is no left axillary adenopathy.  There is no discharge from the nipple on the left breast.  Right chest wall shows radiation dermatitis which is about the same.  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  There is no obvious exudate.  There is no tenderness.  There is no right axillary adenopathy.  HENT:     Head: Normocephalic and atraumatic.  Eyes:     Pupils: Pupils are equal, round, and reactive to light.     Comments: Her ocular exam does show this stye on the upper eyelid of the left eye.  There is an area of purulent material that looks like he might be coming out soon.  She has good extraocular muscle movement.  Pupils react appropriately.  Cardiovascular:     Rate and Rhythm: Normal rate and regular rhythm.     Heart sounds: Normal heart sounds.  Pulmonary:     Effort: Pulmonary effort is normal.     Breath sounds: Normal breath sounds.  Abdominal:     General: Bowel sounds are normal.     Palpations: Abdomen is soft.  Musculoskeletal:        General: No tenderness or deformity. Normal range of motion.     Cervical back: Normal range of motion.  Lymphadenopathy:     Cervical: No cervical adenopathy.   Skin:    General: Skin is warm and dry.     Findings: No erythema or rash.  Neurological:     Mental Status: She is alert and oriented to person, place, and time.  Psychiatric:        Behavior: Behavior normal.        Thought Content: Thought content normal.        Judgment: Judgment normal.       Lab Results  Component Value Date  WBC 5.5 04/18/2022   HGB 11.2 (L) 04/18/2022   HCT 35.7 (L) 04/18/2022   MCV 88.8 04/18/2022   PLT 180 04/18/2022   Lab Results  Component Value Date   FERRITIN 451 (H) 03/17/2022   IRON 55 03/17/2022   TIBC 263 03/17/2022   UIBC 208 03/17/2022   IRONPCTSAT 21 03/17/2022   Lab Results  Component Value Date   RETICCTPCT 1.2 04/18/2022   RBC 4.02 04/18/2022   RBC 4.01 04/18/2022   RETICCTABS 55.2 06/20/2011   No results found for: "KPAFRELGTCHN", "LAMBDASER", "KAPLAMBRATIO" No results found for: "IGGSERUM", "IGA", "IGMSERUM" Lab Results  Component Value Date   TOTALPROTELP 6.6 08/14/2010   ALBUMINELP 53.8 (L) 08/14/2010   A1GS 5.3 (H) 08/14/2010   A2GS 14.3 (H) 08/14/2010   BETS 5.8 08/14/2010   BETA2SER 6.3 08/14/2010   GAMS 14.5 08/14/2010   MSPIKE NOT DET 08/14/2010   SPEI * 08/14/2010     Chemistry      Component Value Date/Time   NA 143 04/18/2022 0927   NA 146 (H) 07/23/2017 0900   NA 144 02/18/2017 0901   K 3.7 04/18/2022 0927   K 3.7 07/23/2017 0900   K 3.7 02/18/2017 0901   CL 106 04/18/2022 0927   CL 107 07/23/2017 0900   CO2 30 04/18/2022 0927   CO2 31 07/23/2017 0900   CO2 27 02/18/2017 0901   BUN 20 04/18/2022 0927   BUN 16 07/23/2017 0900   BUN 17.7 02/18/2017 0901   CREATININE 0.70 04/18/2022 0927   CREATININE 0.8 07/23/2017 0900   CREATININE 0.7 02/18/2017 0901      Component Value Date/Time   CALCIUM 9.3 04/18/2022 0927   CALCIUM 9.3 07/23/2017 0900   CALCIUM 9.3 02/18/2017 0901   ALKPHOS 79 04/18/2022 0927   ALKPHOS 67 07/23/2017 0900   ALKPHOS 78 02/18/2017 0901   AST 12 (L) 04/18/2022 0927    AST 13 02/18/2017 0901   ALT 9 04/18/2022 0927   ALT 20 07/23/2017 0900   ALT 12 02/18/2017 0901   BILITOT 0.4 04/18/2022 0927   BILITOT 0.54 02/18/2017 0901       Impression and Plan: [name] is a very pleasant 71 yo caucasian female with history of locally recurrent adenocarcinoma of the right breast with resection and radiation.   We will continue to hold the Aromasin right now.  I am not sure if she would tolerate this.  Again she has these arthritic issues.  I do not think this is anything from the Aromasin that she was taking.  She has not taken Aromasin now for probably 3 months.  We will see what her iron levels look like.  We will plan to get her back in another month or so.  We will probably plan to get her back before Labor Day.   [name], MD 7/7/202310:23 AM

## 2022-04-21 ENCOUNTER — Telehealth: Payer: Self-pay | Admitting: *Deleted

## 2022-04-21 NOTE — Telephone Encounter (Signed)
-----   Message from [name], MD sent at 04/18/2022  4:26 PM EDT ----- Call and let her know that the iron levels are okay.

## 2022-04-21 NOTE — Telephone Encounter (Signed)
As noted  below by Dr. Ennever, I informed the patient that the iron levels are OK. She verbalized understanding. 

## 2022-04-30 ENCOUNTER — Encounter (HOSPITAL_BASED_OUTPATIENT_CLINIC_OR_DEPARTMENT_OTHER): Payer: Medicare Other | Attending: Physician Assistant | Admitting: Physician Assistant

## 2022-04-30 DIAGNOSIS — I10 Essential (primary) hypertension: Secondary | ICD-10-CM | POA: Insufficient documentation

## 2022-04-30 DIAGNOSIS — L598 Other specified disorders of the skin and subcutaneous tissue related to radiation: Secondary | ICD-10-CM | POA: Diagnosis not present

## 2022-04-30 DIAGNOSIS — Z9221 Personal history of antineoplastic chemotherapy: Secondary | ICD-10-CM | POA: Diagnosis not present

## 2022-04-30 DIAGNOSIS — Z9011 Acquired absence of right breast and nipple: Secondary | ICD-10-CM | POA: Insufficient documentation

## 2022-04-30 DIAGNOSIS — L98492 Non-pressure chronic ulcer of skin of other sites with fat layer exposed: Secondary | ICD-10-CM | POA: Insufficient documentation

## 2022-04-30 DIAGNOSIS — L589 Radiodermatitis, unspecified: Secondary | ICD-10-CM | POA: Insufficient documentation

## 2022-04-30 NOTE — Progress Notes (Signed)
AUTRY, DROEGE ([id number]) Visit Report for 04/30/2022 Arrival Information Details Patient Name: Date of Service: [name] 04/30/2022 9:30 A M Medical Record Number: [id number] Patient Account Number: [id number] Date of Birth/Sex: Treating RN: 11-Nov-1950 (71 y.o. [name], Meta.Reding Primary Care Kizzy Olafson: [name] Other Clinician: Referring Sherrica Niehaus: Treating Lakevia Perris/Extender: [name], [name] in Treatment: 23 Visit Information History Since Last Visit Added or deleted any medications: No Patient Arrived: Ambulatory Any new allergies or adverse reactions: No Arrival Time: 09:28 Had a fall or experienced change in No Accompanied By: self activities of daily living that may affect Transfer Assistance: None risk of falls: Patient Identification Verified: Yes Signs or symptoms of abuse/neglect since last visito No Secondary Verification Process Completed: Yes Hospitalized since last visit: No Patient Requires Transmission-Based Precautions: No Implantable device outside of the clinic excluding No Patient Has Alerts: No cellular tissue based products placed in the center since last visit: Has Dressing in Place as Prescribed: Yes Pain Present Now: No Electronic Signature(s) Signed: 04/30/2022 4:20:13 PM By: [name] RN, BSN Entered By: [name] on 04/30/2022 09:28:45 -------------------------------------------------------------------------------- Encounter Discharge Information Details Patient Name: Date of Service: [name], [name] 04/30/2022 9:30 A M Medical Record Number: [id number] Patient Account Number: [id number] Date of Birth/Sex: Treating RN: [birth year] (71 y.o. [name] Primary Care Sheena Simonis: [name] Other Clinician: Referring Han Vejar: Treating Jaron Czarnecki/Extender: [name], [name]: 22 Encounter Discharge Information Items Post Procedure Vitals Discharge Condition:  Stable Temperature (F): 98.6 Ambulatory Status: Ambulatory Pulse (bpm): 79 Discharge Destination: Home Respiratory Rate (breaths/min): 20 Transportation: Private Auto Blood Pressure (mmHg): 153/76 Accompanied By: self Schedule Follow-up Appointment: Yes Clinical Summary of Care: Electronic Signature(s) Signed: 04/30/2022 4:20:13 PM By: [name] RN, BSN Entered By: [name] on 04/30/2022 09:59:15 -------------------------------------------------------------------------------- Lower Extremity Assessment Details Patient Name: Date of Service: [name], [name] 04/30/2022 9:30 A M Medical Record Number: [id number] Patient Account Number: [id number] Date of Birth/Sex: Treating RN: [birth year] (71 y.o. [name] Primary Care Terri Rorrer: [name] Other Clinician: Referring Yahsir Wickens: Treating Felecity Lemaster/Extender: [name], [name]: 86 Electronic Signature(s) Signed: 04/30/2022 4:20:13 PM By: [name] RN, BSN Entered By: [name] on 04/30/2022 09:29:08 -------------------------------------------------------------------------------- Reliance Details Patient Name: Date of Service: [name], [name] 04/30/2022 9:30 A M Medical Record Number: [id number] Patient Account Number: [id number] Date of Birth/Sex: Treating RN: 09-22-1951 (71 y.o. [name] Primary Care [name]: [name] Other Clinician: Referring Luke Falero: Treating Luzelena Heeg/Extender: [name], [name] in Treatment: 97 [place] reviewed with physician Active Inactive Necrotic Tissue Nursing Diagnoses: Knowledge deficit related to management of necrotic/devitalized tissue Goals: Necrotic/devitalized tissue will be minimized in the wound bed Date Initiated: 07/10/2021 Target Resolution Date: 06/05/2022 Goal Status: Active Interventions: Provide education on necrotic tissue and debridement  process Treatment Activities: Excisional debridement : 07/10/2021 Notes: 01/01/22: Continues to use Santyl Wound/Skin Impairment Nursing Diagnoses: Impaired tissue integrity Knowledge deficit related to ulceration/compromised skin integrity Goals: Patient/caregiver will verbalize understanding of skin care regimen Date Initiated: 08/22/2020 Target Resolution Date: 06/12/2022 Goal Status: Active Ulcer/skin breakdown will have a volume reduction of 30% by week 4 Date Initiated: 08/22/2020 Date Inactivated: 10/03/2020 Target Resolution Date: 09/19/2020 Goal Status: Met Ulcer/skin breakdown will have a volume reduction of 50% by week 8 Date Initiated: 06/05/2021 Date Inactivated: 07/17/2021 Target Resolution Date: 07/03/2021 Goal Status: Unmet Unmet Reason: infection Interventions: Assess patient/caregiver ability to obtain necessary supplies Assess  patient/caregiver ability to perform ulcer/skin care regimen upon admission and as needed Assess ulceration(s) every visit Treatment Activities: Skin care regimen initiated : 08/22/2020 Topical wound management initiated : 08/22/2020 Notes: 06/05/21: Wound care regimen ongoing. Electronic Signature(s) Signed: 04/30/2022 4:20:13 PM By: [name] RN, BSN Entered By: [name] on 04/30/2022 09:45:15 -------------------------------------------------------------------------------- Pain Assessment Details Patient Name: Date of Service: [name], [name] 04/30/2022 9:30 A M Medical Record Number: [id number] Patient Account Number: [id number] Date of Birth/Sex: Treating RN: 26-Jun-1951 (71 y.o. [name] Primary Care Tayja Manzer: [name] Other Clinician: Referring Arva Slaugh: Treating Rusty Glodowski/Extender: [name], [name]: 21 Active Problems Location of Pain Severity and Description of Pain Patient Has Paino No Site Locations Rate the pain. Current Pain Level: 0 Pain Management and  Medication Current Pain Management: Medication: No Cold Application: No Rest: No Massage: No Activity: No T.E.N.S.: No Heat Application: No Leg drop or elevation: No Is the Current Pain Management Adequate: Adequate How does your wound impact your activities of daily livingo Sleep: No Bathing: No Appetite: No Relationship With Others: No Bladder Continence: No Emotions: No Bowel Continence: No Work: No Toileting: No Drive: No Dressing: No Hobbies: No Engineer, maintenance) Signed: 04/30/2022 4:20:13 PM By: [name] RN, BSN Entered By: [name] on 04/30/2022 09:29:03 -------------------------------------------------------------------------------- Patient/Caregiver Education Details Patient Name: Date of Service: [name], [name] 7/19/2023andnbsp9:30 A M Medical Record Number: [id number] Patient Account Number: [id number] Date of Birth/Gender: Treating RN: 01/19/1951 (71 y.o. [name] Primary Care Physician: [name] Other Clinician: Referring Physician: Treating Physician/Extender: [name], [name] in Treatment: 69 Education Assessment Education Provided To: Patient Education Topics Provided Wound Debridement: Handouts: Wound Debridement Methods: Explain/Verbal Responses: State content correctly Electronic Signature(s) Signed: 04/30/2022 4:20:13 PM By: [name] RN, BSN Entered By: [name] on 04/30/2022 09:45:29 -------------------------------------------------------------------------------- Wound Assessment Details Patient Name: Date of Service: [name], [name] 04/30/2022 9:30 A M Medical Record Number: [id number] Patient Account Number: [id number] Date of Birth/Sex: Treating RN: 11-Jun-1951 (71 y.o. [name], [name] Primary Care Amarrah Meinhart: [name] Other Clinician: Referring Adel Neyer: Treating Dietrich Samuelson/Extender: [name], [name]: 88 Wound Status Wound  Number: 1 Primary Soft Tissue Radionecrosis Etiology: Wound Location: Right Breast (mastectomy site) Wound Open Wounding Event: Radiation Burn Status: Date Acquired: 07/26/2020 Comorbid Anemia, Lymphedema, Deep Vein Thrombosis, Hypertension, Weeks Of Treatment: 88 History: Peripheral Venous Disease, Osteoarthritis, Received Clustered Wound: No Chemotherapy, Received Radiation, Confinement Anxiety Photos Wound Measurements Length: (cm) 1.8 Width: (cm) 3.2 Depth: (cm) 0.6 Area: (cm) 4.524 Volume: (cm) 2.714 % Reduction in Area: -195.3% % Reduction in Volume: -1673.9% Epithelialization: None Tunneling: No Undermining: Yes Starting Position (o'clock): 10 Ending Position (o'clock): 12 Maximum Distance: (cm) 0.6 Wound Description Classification: Full Thickness Without Exposed Support Structures Wound Margin: Distinct, outline attached Exudate Amount: Medium Exudate Type: Serosanguineous Exudate Color: red, brown Foul Odor After Cleansing: No Slough/Fibrino Yes Wound Bed Granulation Amount: Medium (34-66%) Exposed Structure Granulation Quality: Pink Fascia Exposed: No Necrotic Amount: Medium (34-66%) Fat Layer (Subcutaneous Tissue) Exposed: Yes Necrotic Quality: Adherent Slough Tendon Exposed: No Muscle Exposed: No Joint Exposed: No Bone Exposed: No Treatment Notes Wound #1 (Breast (mastectomy site)) Wound Laterality: Right Cleanser Soap and Water Discharge Instruction: May shower and wash wound with dial antibacterial soap and water prior to dressing change. Peri-Wound Care Skin Prep Discharge Instruction: Use skin prep as directed Topical topical compounding antibiotics Discharge Instruction: [place]- compounding topical antibiotics once arrives apply under  the santyl. Primary Dressing Santyl Ointment Discharge Instruction: Apply nickel thick amount to wound bed as instructed Secondary Dressing Cosmopor Wound Dressing, 4x4 (in/in) (Border  Gauze) Discharge Instruction: Apply bordered gauze or foam border over primary dressing. Woven Gauze Sponge, Non-Sterile 4x4 in Discharge Instruction: Apply over primary dressing as directed. Woven Gauze Sponges 2x2 in Discharge Instruction: Apply over primary dressing, fold in corners to fill the space Secured With Compression Wrap Compression Stockings Add-Ons Electronic Signature(s) Signed: 04/30/2022 3:34:17 PM By: Rhae Hammock RN Signed: 04/30/2022 4:20:13 PM By: [name] RN, BSN Entered By: Rhae Hammock on 04/30/2022 09:30:20 -------------------------------------------------------------------------------- Vitals Details Patient Name: Date of Service: [name], [name] 04/30/2022 9:30 A M Medical Record Number: [id number] Patient Account Number: [id number] Date of Birth/Sex: Treating RN: 1951/04/24 (71 y.o. [name], [name] Primary Care Brailey Buescher: [name] Other Clinician: Referring Jailyn Leeson: Treating Trinity Haun/Extender: [name], [name]: 20 Vital Signs Time Taken: 09:25 Temperature (F): 98.6 Height (in): 63 Pulse (bpm): 79 Weight (lbs): 176 Respiratory Rate (breaths/min): 20 Body Mass Index (BMI): 31.2 Blood Pressure (mmHg): 153/76 Reference Range: 80 - 120 mg / dl Electronic Signature(s) Signed: 04/30/2022 4:20:13 PM By: [name] RN, BSN Entered By: [name] on 04/30/2022 09:28:56

## 2022-04-30 NOTE — Progress Notes (Addendum)
[name], [name] ([id number]) Visit Report for 04/30/2022 Chief Complaint Document Details Patient Name: Date of Service: [name], [name] 04/30/2022 9:30 A M Medical Record Number: [id number] Patient Account Number: [id number] Date of Birth/Sex: Treating RN: 06/19/1951 (71 y.o. 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 [name], [name] Primary Care Provider: [name] Other Clinician: Referring Provider: Treating Provider/Extender: [name], [name] in Treatment: 88 Debridement Performed for Assessment: Wound #1 Right Breast (mastectomy site) Performed By: Physician Worthy Keeler, PA Debridement Type: Debridement Level of Consciousness (Pre-procedure): Awake and Alert Pre-procedure Verification/Time Out Yes - 09:50 Taken: Start Time: 09:51 Pain Control: Lidocaine 4% T opical Solution T Area Debrided (L x W): otal 1.8 (cm) x 2 (cm) = 3.6 (cm) Tissue and other material debrided: Viable, Non-Viable, Slough, Subcutaneous, Fibrin/Exudate, Slough Level: Skin/Subcutaneous Tissue Debridement Description: Excisional Instrument: Curette Bleeding: Minimum Hemostasis Achieved: Pressure End Time: 09:54 Procedural Pain: 0 Post Procedural Pain: 0 Response to Treatment: Procedure was tolerated well Level of  Consciousness (Post- Awake and Alert procedure): Post Debridement Measurements of Total Wound Length: (cm) 1.8 Width: (cm) 3.2 Depth: (cm) 0.6 Volume: (cm) 2.714 Character of Wound/Ulcer Post Debridement: Improved Post Procedure Diagnosis Same as Pre-procedure Electronic Signature(s) Signed: 04/30/2022 3:46:42 PM By: Worthy Keeler PA-C Signed: 04/30/2022 4:20:13 PM By: [name] RN, BSN Entered By: [name] on 04/30/2022 09:54:46 -------------------------------------------------------------------------------- HPI Details Patient Name: Date of Service: [name], [name] 04/30/2022 9:30 A M Medical Record Number: [id number] Patient Account Number: [id number] Date of Birth/Sex: Treating RN: [birth year] (71 y.o. [name] Primary Care Provider: [name] Other Clinician: Referring Provider: Treating Provider/Extender: [name], [name]: 94 History of Present Illness HPI Description: 08/22/2020 upon evaluation today patient actually appears to be doing somewhat poorly in regard to her mastectomy site on the right chest wall. She had a mastectomy initially in 1998. She had chemotherapy only no radiation following. In 2002 she subsequently did undergo radiation for the first time and then subsequently in 2012 had repeat radiation after having had a finding that was consistent with a relapse of the cancer. 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 She also has radiation damage to the skin over the right chest wall where she had the mastectomy. She was referred to [place] by Dr. Matthew Saras in [facility] and her oncologist is Dr. Burney Gauze. Subsequently I want to make sure before we delve into this more deeply that the patient does not have any issues with a return of cancer that needs to be managed by them. Obviously she does have significant scar tissue which may be benefited secondary  to the soft tissue radionecrosis by hyperbaric oxygen therapy in the absence of any recurrence of the cancer. Nonetheless she does not remember exactly when her last PET scan was but it was not too recently she tells me. 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 The patient does  have hypertension as well. She sees her oncologist next on December 6. 09/12/2020 on evaluation today patient actually appears to be doing excellent in regard to her wound under the right breast location. Currently this is measuring smaller in general seems to be doing very well. She tells me that the collagen is doing a good job and that the dressing that she has been putting on is not causing any troubles as far pulling on her skin or scar tissue is concerned all of which is excellent news. 10/03/2020 upon evaluation today patient appears to be doing well with regard to her surgical site from her mastectomy on the right breast region. She tells me that she has had very little bleeding nothing seems to be sticking badly and in general she has been extremely pleased with where things stand today. No fevers, chills, nausea, vomiting, or diarrhea. 10/24/2020 upon evaluation today patient actually appears to be doing excellent in regard to her wound. There is just a very small area still open and to be honest there was minimal slough noted on the surface of the wound nothing that requires sharp debridement. In general I feel like she is actually doing quite well and overall I am pleased. I do think we may switch to silver alginate dressing and also contemplating using a AandE ointment in order to help keep everything moist and the scar tissue region while the alginate keeps it dry enough to heal 11/14/2020 upon evaluation today patient appears to be doing well at this point in regard to her wound. I do feel like that she is making good progress again this is a very difficult region over the surgical site of the right  chest wall at the site of mastectomy. Nonetheless I think that we are just a very small area still open I think alginate is doing a good job helping to dry this up and I would recommend this such that we continue with this. 01/02/2021 on evaluation today patient's wound actually appears to be doing decently well. There does not appear to be any signs of active infection which is great news. She does have some slight slough buildup with biofilm on the surface of the wound mainly more biofilm. Nonetheless I was able to gently remove this with saline and gauze as well as a sterile Q-tip. She tolerated that today without complication. 01/30/2021 upon evaluation today patient appears to be doing well with regard to her wound although she still has quite a bit of trouble getting this to close I think the scar tissue and radiation damage is quite significant here unfortunately. There does not appear to be any signs of active infection which is great news. No fevers, chills, nausea, vomiting, or diarrhea. 02/27/2021 upon evaluation today patient appears to be doing excellent in regard to her wound. She has been tolerating the dressing changes without complication and in general I am extremely pleased with where things stand today. There does not appear to be any signs of infection which is great news. No fevers, chills, nausea, vomiting, or diarrhea. With that being said I do think that she still developing some slough buildup. I did discuss with her today the possibility of proceeding with HBO therapy. We have had this discussion before but she really is not quite committed to wanting to do that much and spend that much time in the chamber. She wants to still think about it. 03/27/2021 upon evaluation today patient appears to be doing about the same in regard  to her wound. She still developing a lot of slough buildup on the surface of the wound. I did try to clean that away to some degree today. She tolerated  that without any pain or complication. Subsequently I am thinking we may want to switch over to [facility] see if this may do better for her. Patient is in agreement with giving that a trial. 05/01/2021 upon evaluation today patient appears to be doing about the same in regard to her wounds. She has been tolerating the dressing changes without complication. Fortunately there does not appear to be any signs of active infection at this time which is great news. No fevers, chills, nausea, vomiting, or diarrhea.. 06/05/2021 upon evaluation today patient appears to be doing pretty well currently in regard to her wound at the mastectomy site right chest wall. Overall since we switch back to the alginate she tells me things have significantly improved she is much happier with where things stand currently. Fortunately there does not appear to be any signs of active infection which is great news. No fevers, chills, nausea, vomiting, or diarrhea. 07/10/2021 upon evaluation today patient unfortunately does not appear to be doing nearly as well as what she previously was. There does not appear to be any evidence of new epithelial growth she has a lot of necrotic tissue the base of the wound this is much more significant than what we previously noted. She also has been having increased pain and increased drainage. In general I am very concerned about this especially in light of the fact that she does have a history of breast cancer I want to ensure that were not looking at any cancerous type lesion at this point. Otherwise also think she does need some debridement we did do MolecuLight scanning today. 07/17/2021 upon evaluation today patient appears to be doing well with regard to her wound compared to last week although she still having some erythema I am concerned in this regard. I do think that she fortunately had a negative biopsy which is great news unfortunately she did have Pseudomonas noted as a bacteria present  in the culture which I think is good and need to be addressed with Levaquin. I Minna send that into the pharmacy today. We did repeat the MolecuLight screening. 07/31/2021 upon evaluation today patient's wound is actually showing signs of improvement which is great news. Fortunately there does not appear to be any signs of infection currently locally nor systemically and I am very pleased in that regard. With that being said the patient is having some issues here with continued necrotic tissue I think packing with the Dakin's moistened gauze dressing is still in the right leg ago. 08/14/2021 upon evaluation today patient appears to be doing well with regard to her wound all things considered I do not see any signs of significant infection which is great news. Fortunately there does not appear to be any evidence of infection which is also great news. In general I think that the patient is tolerating the dressing changes well. We been using a Dakin's moistened gauze. 08/28/2021 upon evaluation today patient appears to be doing well with regard to her wound. Worsening signs of definite improvement which is great news and overall very pleased with where we stand today. There is no signs of inflamed tissue or infection which is great as well and I think the Dakin's is doing a great job here. 09/11/2021 upon evaluation today patient appears to be doing well with regard to her  wound all things considered. I am can perform some debridement today to clear away some of the necrotic debris with that being said overall I think that she is making decently good progress here which is great news. There does not appear to be any signs of active infection also good news. That in fact is probably the best news in her mind. 09/25/2021 upon evaluation today patient appears to be doing decently well in regard to her wound. Overall I do not see any signs of infection and I think she is doing quite well. I do believe that we  are making headway here although is very slow due to the scarring and the depth of the wound this is a significantly deep wound. 10/16/2021 upon evaluation today patient appears to be doing about the same in regard to the wound on her right chest wall. Fortunately there does not appear to be any signs of active infection locally nor systemically which is great news. With that being said this still was not cleaning up quite as quickly as I would like to see. Fortunately I think that we can definitely give the Santyl another try we tried this in the past and unfortunately did not have a good result but again I think she was infected at that time as well which was part of the issue. Nonetheless I think currently that we will be able to go ahead and see what we can do about getting this little cleaner with the Santyl. 11/06/2021 upon evaluation today patient appears to be doing well with regard to her wound. This actually appears to be a little bit better with the Santyl I am happy in that regard. Fortunately I do not see any signs of active infection at this time. No fevers, chills, nausea, vomiting, or diarrhea. 12/04/2021 upon evaluation patient appears to be doing well with the Santyl at this point. I am very pleased with where we stand and I think that she is making good progress here. Fortunately I do not see any evidence of active infection locally or systemically at this time which is great news. No fevers, chills, nausea, vomiting, or diarrhea. 01/01/2022 upon evaluation today patient actually is making excellent progress with the Santyl. I am extremely pleased with where we stand today. I do not see any signs of infection. 01-29-2022 upon evaluation today patient appears to be doing well with regard to her wound. This is showing signs of some improvement. It is a little less deep than what it was. Size wise is also slightly smaller but again there still is a significant wound in this area. Fortunately  I do not see any evidence of infection and she is doing quite well. 02-26-2022 upon evaluation today patient appears to be doing well with regard to her wound. In fact this is actually the best that I have seen in a very long time. I do not see any evidence of active infection at this time locally or systemically which is great news and overall I think we are on the right track. Nonetheless I do believe that the patient is continuing to show evidence of improvement with the Santyl week by week. 03-26-2022 upon evaluation today patient appears to be doing well with regard to the wound on her right mastectomy site. She is actually showing signs of excellent improvement in fact there was some bleeding just with a little bit of light cleaning over the area. Fortunately I do not see any signs of active infection locally  or systemically at this time which is great news. 04-30-2022 upon evaluation today patient appears to be doing well currently in regard to her wound. She is actually showing some signs of improvement here which is great news. Still this is very very slow. Subsequently I do believe that she may benefit from the use of [place] topical antibiotics for short amount of time before applying a skin substitute I think EpiCord could be doing very well for her as far as that is concerned. I discussed that with her today. Fortunately I do not see any evidence of active infection locally or systemically at this time which is great news. No fevers, chills, nausea, vomiting, or diarrhea. Electronic Signature(s) Signed: 04/30/2022 10:11:25 AM By: Worthy Keeler PA-C Entered By: Worthy Keeler on 04/30/2022 10:11:25 -------------------------------------------------------------------------------- Physical Exam Details Patient Name: Date of Service: [name], [name] 04/30/2022 9:30 A M Medical Record Number: [id number] Patient Account Number: [id number] Date of Birth/Sex: Treating RN: 10/29/1950 (71 y.o. [name] Primary Care Provider: [name] Other Clinician: Referring Provider: Treating Provider/Extender: [name], [name]: 77 Constitutional [name] and well-hydrated in no acute distress. Respiratory normal breathing without difficulty. Psychiatric this patient is able to make decisions and demonstrates good insight into disease process. Alert and Oriented x 3. pleasant and cooperative. Notes Upon inspection patient's wound bed actually showed signs of good granulation in some areas there was some slough and biofilm buildup I did perform debridement of clearway some of the necrotic debris patient tolerated this today without complication and postdebridement wound bed appears to be doing significantly better. I do think she could benefit from a skin substitute. Electronic Signature(s) Signed: 04/30/2022 10:11:46 AM By: Worthy Keeler PA-C Entered By: Worthy Keeler on 04/30/2022 10:11:46 -------------------------------------------------------------------------------- Physician Orders Details Patient Name: Date of Service: [name], [name] 04/30/2022 9:30 A M Medical Record Number: [id number] Patient Account Number: [id number] Date of Birth/Sex: Treating RN: [birth year] (71 y.o. [name] Primary Care Provider: [name] Other Clinician: Referring Provider: Treating Provider/Extender: [name], [name]: 50 Verbal / Phone Orders: No Diagnosis Coding ICD-10 Coding Code Description L98.492 Non-pressure chronic ulcer of skin of other sites with fat layer exposed L58.9 Radiodermatitis, unspecified L59.8 Other specified disorders of the skin and subcutaneous tissue related to radiation I10 Essential (primary) hypertension Follow-up Appointments Return appointment in 3 weeks. - Wednesday Jeri Cos, PA and [place], Room 7 0930 05/28/2022 Other: - Will order compounding topical  antibiotics from [facility]. The Pharmacy will call you as well. You will apply daily they will ship to your home. Cellular or Tissue Based Products Cellular or Tissue Based Product Type: - wound clinic to run insurance authorization for epicord. Bathing/ Shower/ Hygiene May shower and wash wound with soap and water. - on days that dressing is changed Additional Orders / Instructions Follow Nutritious Diet Wound Treatment Wound #1 - Breast (mastectomy site) Wound Laterality: Right Cleanser: Soap and Water 1 x Per Day/30 Days Discharge Instructions: May shower and wash wound with dial antibacterial soap and water prior to dressing change. Peri-Wound Care: Skin Prep (Generic) 1 x Per Day/30 Days Discharge Instructions: Use skin prep as directed Topical: topical compounding antibiotics 1 x Per Day/30 Days Discharge Instructions: [place]- compounding topical antibiotics once arrives apply under the santyl. Prim Dressing: Santyl Ointment 1 x Per Day/30 Days ary Discharge Instructions: Apply nickel thick amount to wound bed as instructed Secondary Dressing: Cosmopor  Wound Dressing, 4x4 (in/in) (Border Gauze) (Generic) 1 x Per Day/30 Days Discharge Instructions: Apply bordered gauze or foam border over primary dressing. Secondary Dressing: Woven Gauze Sponge, Non-Sterile 4x4 in (Generic) 1 x Per Day/30 Days Discharge Instructions: Apply over primary dressing as directed. Secondary Dressing: Woven Gauze Sponges 2x2 in (Generic) 1 x Per Day/30 Days Discharge Instructions: Apply over primary dressing, fold in corners to fill the space Electronic Signature(s) Signed: 04/30/2022 3:46:42 PM By: Worthy Keeler PA-C Signed: 04/30/2022 4:20:13 PM By: [name] RN, BSN Entered By: [name] on 04/30/2022 10:07:37 -------------------------------------------------------------------------------- Problem List Details Patient Name: Date of Service: [name], [name] 04/30/2022 9:30 A  M Medical Record Number: [id number] Patient Account Number: [id number] Date of Birth/Sex: Treating RN: 24-Aug-1951 (71 y.o. [name] Primary Care Provider: [name] Other Clinician: Referring Provider: Treating Provider/Extender: [name], [name]: 58 Active Problems ICD-10 Encounter Code Description Active Date MDM Diagnosis L98.492 Non-pressure chronic ulcer of skin of other sites with fat layer exposed 08/22/2020 No Yes L58.9 Radiodermatitis, unspecified 08/22/2020 No Yes L59.8 Other specified disorders of the skin and subcutaneous tissue related to 08/22/2020 No Yes radiation I10 Essential (primary) hypertension 08/22/2020 No Yes Inactive Problems Resolved Problems Electronic Signature(s) Signed: 04/30/2022 9:29:10 AM By: Worthy Keeler PA-C Entered By: Worthy Keeler on 04/30/2022 09:29:10 -------------------------------------------------------------------------------- Progress Note Details Patient Name: Date of Service: [name], [name] 04/30/2022 9:30 A M Medical Record Number: [id number] Patient Account Number: [id number] Date of Birth/Sex: Treating RN: [birth year] (71 y.o. 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 She had a mastectomy initially in 1998. She had chemotherapy only no radiation following. In 2002 she subsequently did undergo radiation for the first time and then subsequently in 2012 had repeat radiation after having had a finding that was consistent with a relapse of the  cancer. 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 She also has radiation damage to the skin over the right chest wall where she had the mastectomy. She was referred to [place] by Dr. Matthew Saras in [facility] and her oncologist is Dr. Burney Gauze. Subsequently I want to make sure before we delve into this more deeply that the patient does not have any issues with a return of cancer that needs to be managed by them. 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 Nonetheless she does not remember exactly when her last PET scan was but it was not too recently she tells me. 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 The patient does have hypertension as well. She sees her oncologist next on December 6. 09/12/2020 on evaluation today patient actually appears to be doing excellent in regard to her wound under the right breast location. Currently this is measuring smaller in general seems to be doing very well. She tells me that the collagen is doing a good job and that the dressing that she has been putting on is not  causing any troubles as far pulling on her skin or scar tissue is concerned all of which is excellent news. 10/03/2020 upon evaluation today patient appears to be doing well with regard to her surgical site from her mastectomy on the right breast region. She tells me that she has had very little bleeding nothing seems to be sticking badly and in general she has been extremely pleased with where things stand today. No fevers, chills, nausea, vomiting, or diarrhea. 10/24/2020 upon evaluation today patient actually appears to be doing excellent in regard to her wound. There is just a very small area still open and to be honest there was minimal slough noted on the surface of the wound nothing  that requires sharp debridement. In general I feel like she is actually doing quite well and overall I am pleased. I do think we may switch to silver alginate dressing and also contemplating using a AandE ointment in order to help keep everything moist and the scar tissue region while the alginate keeps it dry enough to heal 11/14/2020 upon evaluation today patient appears to be doing well at this point in regard to her wound. I do feel like that she is making good progress again this is a very difficult region over the surgical site of the right chest wall at the site of mastectomy. Nonetheless I think that we are just a very small area still open I think alginate is doing a good job helping to dry this up and I would recommend this such that we continue with this. 01/02/2021 on evaluation today patient's wound actually appears to be doing decently well. There does not appear to be any signs of active infection which is great news. She does have some slight slough buildup with biofilm on the surface of the wound mainly more biofilm. Nonetheless I was able to gently remove this with saline and gauze as well as a sterile Q-tip. She tolerated that today without complication. 01/30/2021 upon evaluation today patient appears to be doing well with regard to her wound although she still has quite a bit of trouble getting this to close I think the scar tissue and radiation damage is quite significant here unfortunately. There does not appear to be any signs of active infection which is great news. No fevers, chills, nausea, vomiting, or diarrhea. 02/27/2021 upon evaluation today patient appears to be doing excellent in regard to her wound. She has been tolerating the dressing changes without complication and in general I am extremely pleased with where things stand today. There does not appear to be any signs of infection which is great news. No fevers, chills, nausea, vomiting, or diarrhea. With that being said I  do think that she still developing some slough buildup. I did discuss with her today the possibility of proceeding with HBO therapy. We have had this discussion before but she really is not quite committed to wanting to do that much and spend that much time in the chamber. She wants to still think about it. 03/27/2021 upon evaluation today patient appears to be doing about the same in regard to her wound. She still developing a lot of slough buildup on the surface of the wound. I did try to clean that away to some degree today. She tolerated that without any pain or complication. Subsequently I am thinking we may want to switch over to [facility] see if this may do better for her. Patient is in agreement with giving that a trial. 05/01/2021 upon evaluation  today patient appears to be doing about the same in regard to her wounds. She has been tolerating the dressing changes without complication. Fortunately there does not appear to be any signs of active infection at this time which is great news. No fevers, chills, nausea, vomiting, or diarrhea.. 06/05/2021 upon evaluation today patient appears to be doing pretty well currently in regard to her wound at the mastectomy site right chest wall. Overall since we switch back to the alginate she tells me things have significantly improved she is much happier with where things stand currently. Fortunately there does not appear to be any signs of active infection which is great news. No fevers, chills, nausea, vomiting, or diarrhea. 07/10/2021 upon evaluation today patient unfortunately does not appear to be doing nearly as well as what she previously was. There does not appear to be any evidence of new epithelial growth she has a lot of necrotic tissue the base of the wound this is much more significant than what we previously noted. She also has been having increased pain and increased drainage. In general I am very concerned about this especially in light of the  fact that she does have a history of breast cancer I want to ensure that were not looking at any cancerous type lesion at this point. Otherwise also think she does need some debridement we did do MolecuLight scanning today. 07/17/2021 upon evaluation today patient appears to be doing well with regard to her wound compared to last week although she still having some erythema I am concerned in this regard. I do think that she fortunately had a negative biopsy which is great news unfortunately she did have Pseudomonas noted as a bacteria present in the culture which I think is good and need to be addressed with Levaquin. I Minna send that into the pharmacy today. We did repeat the MolecuLight screening. 07/31/2021 upon evaluation today patient's wound is actually showing signs of improvement which is great news. Fortunately there does not appear to be any signs of infection currently locally nor systemically and I am very pleased in that regard. With that being said the patient is having some issues here with continued necrotic tissue I think packing with the Dakin's moistened gauze dressing is still in the right leg ago. 08/14/2021 upon evaluation today patient appears to be doing well with regard to her wound all things considered I do not see any signs of significant infection which is great news. Fortunately there does not appear to be any evidence of infection which is also great news. In general I think that the patient is tolerating the dressing changes well. We been using a Dakin's moistened gauze. 08/28/2021 upon evaluation today patient appears to be doing well with regard to her wound. Worsening signs of definite improvement which is great news and overall very pleased with where we stand today. There is no signs of inflamed tissue or infection which is great as well and I think the Dakin's is doing a great job here. 09/11/2021 upon evaluation today patient appears to be doing well with regard  to her wound all things considered. I am can perform some debridement today to clear away some of the necrotic debris with that being said overall I think that she is making decently good progress here which is great news. There does not appear to be any signs of active infection also good news. That in fact is probably the best news in her mind. 09/25/2021 upon evaluation today patient  appears to be doing decently well in regard to her wound. Overall I do not see any signs of infection and I think she is doing quite well. I do believe that we are making headway here although is very slow due to the scarring and the depth of the wound this is a significantly deep wound. 10/16/2021 upon evaluation today patient appears to be doing about the same in regard to the wound on her right chest wall. Fortunately there does not appear to be any signs of active infection locally nor systemically which is great news. With that being said this still was not cleaning up quite as quickly as I would like to see. Fortunately I think that we can definitely give the Santyl another try we tried this in the past and unfortunately did not have a good result but again I think she was infected at that time as well which was part of the issue. Nonetheless I think currently that we will be able to go ahead and see what we can do about getting this little cleaner with the Santyl. 11/06/2021 upon evaluation today patient appears to be doing well with regard to her wound. This actually appears to be a little bit better with the Santyl I am happy in that regard. Fortunately I do not see any signs of active infection at this time. No fevers, chills, nausea, vomiting, or diarrhea. 12/04/2021 upon evaluation patient appears to be doing well with the Santyl at this point. I am very pleased with where we stand and I think that she is making good progress here. Fortunately I do not see any evidence of active infection locally or systemically  at this time which is great news. No fevers, chills, nausea, vomiting, or diarrhea. 01/01/2022 upon evaluation today patient actually is making excellent progress with the Santyl. I am extremely pleased with where we stand today. I do not see any signs of infection. 01-29-2022 upon evaluation today patient appears to be doing well with regard to her wound. This is showing signs of some improvement. It is a little less deep than what it was. Size wise is also slightly smaller but again there still is a significant wound in this area. Fortunately I do not see any evidence of infection and she is doing quite well. 02-26-2022 upon evaluation today patient appears to be doing well with regard to her wound. In fact this is actually the best that I have seen in a very long time. I do not see any evidence of active infection at this time locally or systemically which is great news and overall I think we are on the right track. Nonetheless I do believe that the patient is continuing to show evidence of improvement with the Santyl week by week. 03-26-2022 upon evaluation today patient appears to be doing well with regard to the wound on her right mastectomy site. She is actually showing signs of excellent improvement in fact there was some bleeding just with a little bit of light cleaning over the area. Fortunately I do not see any signs of active infection locally or systemically at this time which is great news. 04-30-2022 upon evaluation today patient appears to be doing well currently in regard to her wound. She is actually showing some signs of improvement here which is great news. Still this is very very slow. Subsequently I do believe that she may benefit from the use of Keystone topical antibiotics for short amount of time before applying a skin substitute  I think EpiCord could be doing very well for her as far as that is concerned. I discussed that with her today. Fortunately I do not see any evidence of  active infection locally or systemically at this time which is great news. No fevers, chills, nausea, vomiting, or diarrhea. Objective Constitutional [name] and well-hydrated in no acute distress. Vitals Time Taken: 9:25 AM, Height: 63 in, Weight: 176 lbs, BMI: 31.2, Temperature: 98.6 F, Pulse: 79 bpm, Respiratory Rate: 20 breaths/min, Blood Pressure: 153/76 mmHg. Respiratory normal breathing without difficulty. Psychiatric this patient is able to make decisions and demonstrates good insight into disease process. Alert and Oriented x 3. pleasant and cooperative. General Notes: Upon inspection patient's wound bed actually showed signs of good granulation in some areas there was some slough and biofilm buildup I did perform debridement of clearway some of the necrotic debris patient tolerated this today without complication and postdebridement wound bed appears to be doing significantly better. I do think she could benefit from a skin substitute. Integumentary (Hair, Skin) Wound #1 status is Open. Original cause of wound was Radiation Burn. The date acquired was: 07/26/2020. The wound has been in treatment 88 weeks. The wound is located on the Right Breast (mastectomy site). The wound measures 1.8cm length x 3.2cm width x 0.6cm depth; 4.524cm^2 area and 2.714cm^3 volume. There is Fat Layer (Subcutaneous Tissue) exposed. There is no tunneling noted, however, there is undermining starting at 10:00 and ending at 12:00 with a maximum distance of 0.6cm. There is a medium amount of serosanguineous drainage noted. The wound margin is distinct with the outline attached to the wound base. There is medium (34-66%) pink granulation within the wound bed. There is a medium (34-66%) amount of necrotic tissue within the wound bed including Adherent Slough. 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 There was a Excisional Skin/Subcutaneous Tissue Debridement with a total area of 3.6 sq cm performed by Worthy Keeler, PA. With the following instrument(s): Curette to remove Viable and Non-Viable tissue/material. Material removed includes Subcutaneous Tissue, Slough, and Fibrin/Exudate after achieving pain control using Lidocaine 4% T opical Solution. A time out was conducted at 09:50, prior to the start of the procedure. A Minimum amount of bleeding was controlled with Pressure. The procedure was tolerated well with a pain level of 0 throughout and a pain level of 0 following the procedure. Post Debridement Measurements: 1.8cm length x 3.2cm width x 0.6cm depth; 2.714cm^3 volume. Character of Wound/Ulcer Post Debridement is improved. Post procedure Diagnosis Wound #1: Same as Pre-Procedure Plan Follow-up Appointments: Return appointment in 3 weeks. - Wednesday Jeri Cos, PA and [place], Room 7 0930 05/28/2022 Other: - Will order compounding topical antibiotics from [name]. The Pharmacy will call you as well. You will apply daily they will ship to your home. Cellular or Tissue Based Products: Cellular or Tissue Based Product Type: - wound clinic to run insurance authorization for epicord. Bathing/ Shower/ Hygiene: May shower and wash wound with soap and water. - on days that dressing is changed Additional Orders / Instructions: Follow Nutritious Diet WOUND #1: - Breast (mastectomy site) Wound Laterality: Right Cleanser: Soap and Water 1 x Per Day/30 Days Discharge Instructions: May shower and wash wound with  dial antibacterial soap and water prior to dressing change. Peri-Wound Care: Skin Prep (Generic) 1 x Per Day/30 Days Discharge  Instructions: Use skin prep as directed Topical: topical compounding antibiotics 1 x Per Day/30 Days Discharge Instructions: [place]- compounding topical antibiotics once arrives apply under the santyl. Prim Dressing: Santyl Ointment 1 x Per Day/30 Days ary Discharge Instructions: Apply nickel thick amount to wound bed as instructed Secondary Dressing: Cosmopor Wound Dressing, 4x4 (in/in) (Border Gauze) (Generic) 1 x Per Day/30 Days Discharge Instructions: Apply bordered gauze or foam border over primary dressing. Secondary Dressing: Woven Gauze Sponge, Non-Sterile 4x4 in (Generic) 1 x Per Day/30 Days Discharge Instructions: Apply over primary dressing as directed. Secondary Dressing: Woven Gauze Sponges 2x2 in (Generic) 1 x Per Day/30 Days Discharge Instructions: Apply over primary dressing, fold in corners to fill the space 1. Would recommend currently that we going continue with the wound care measures as before and the patient is in agreement with plan. This includes the use of the Santyl ointment which is doing quite well. 2. I am also can recommend that we have the patient continue to monitor for any signs of worsening infection. 3. We are going to initiate treatment with topical Keystone antibiotics for the next several weeks and then following that my hope is that we will be able to get [place] approved for authorization and application to try to see if we can speed up this healing which would be awesome for the patient. We will see patient back for reevaluation in 1 week here in the clinic. If anything worsens or changes patient will contact our office for additional recommendations. Electronic Signature(s) Signed: 04/30/2022 10:12:43 AM By: Worthy Keeler PA-C Entered By: Worthy Keeler on 04/30/2022 10:12:43 -------------------------------------------------------------------------------- SuperBill Details Patient Name: Date of Service: [name], Frederika YE 04/30/2022 Medical  Record Number: [id number] Patient Account Number: [id number] Date of Birth/Sex: Treating RN: 1951-08-08 (71 y.o. [name], [name] Primary Care Provider: [name] Other Clinician: Referring Provider: Treating Provider/Extender: [name], [name]: 88 Diagnosis Coding ICD-10 Codes Code Description [phone] Non-pressure chronic ulcer of skin of other sites with fat layer exposed L58.9 Radiodermatitis, unspecified L59.8 Other specified disorders of the skin and subcutaneous tissue related to radiation I10 Essential (primary) hypertension Facility Procedures The patient participates with Medicare or their insurance follows the Medicare Facility Guidelines: CPT4 Code Description Modifier Quantity [phone] [id number] - DEB SUBQ TISSUE 20 SQ CM/< 1 ICD-10 Diagnosis Description L98.492 Non-pressure chronic ulcer of  skin of other sites with fat layer exposed Physician Procedures : CPT4 Code Description Modifier [id number] - WC PHYS LEVEL 4 - EST PT 25 ICD-10 Diagnosis Description L98.492 Non-pressure chronic ulcer of skin of other sites with fat layer exposed L58.9 Radiodermatitis, unspecified L59.8 Other specified disorders  of the skin and subcutaneous tissue related to radiation I10 Essential (primary) hypertension Quantity: 1 : [id number] - WC PHYS SUBQ TISS 20 SQ CM ICD-10 Diagnosis Description L98.492 Non-pressure chronic ulcer of skin of other sites with fat layer exposed Quantity: 1 Electronic Signature(s) Signed: 04/30/2022 10:13:10 AM By: Worthy Keeler PA-C Entered By: Worthy Keeler on 04/30/2022 10:13:10

## 2022-05-09 DIAGNOSIS — Z6833 Body mass index (BMI) 33.0-33.9, adult: Secondary | ICD-10-CM | POA: Diagnosis not present

## 2022-05-09 DIAGNOSIS — I700 Atherosclerosis of aorta: Secondary | ICD-10-CM | POA: Diagnosis not present

## 2022-05-09 DIAGNOSIS — I10 Essential (primary) hypertension: Secondary | ICD-10-CM | POA: Diagnosis not present

## 2022-05-12 DIAGNOSIS — E669 Obesity, unspecified: Secondary | ICD-10-CM | POA: Diagnosis not present

## 2022-05-12 DIAGNOSIS — I10 Essential (primary) hypertension: Secondary | ICD-10-CM | POA: Diagnosis not present

## 2022-05-13 DIAGNOSIS — E782 Mixed hyperlipidemia: Secondary | ICD-10-CM | POA: Diagnosis not present

## 2022-05-13 DIAGNOSIS — I10 Essential (primary) hypertension: Secondary | ICD-10-CM | POA: Diagnosis not present

## 2022-05-28 ENCOUNTER — Encounter (HOSPITAL_BASED_OUTPATIENT_CLINIC_OR_DEPARTMENT_OTHER): Payer: Medicare Other | Attending: Physician Assistant | Admitting: Physician Assistant

## 2022-05-28 DIAGNOSIS — L598 Other specified disorders of the skin and subcutaneous tissue related to radiation: Secondary | ICD-10-CM | POA: Insufficient documentation

## 2022-05-28 DIAGNOSIS — Z853 Personal history of malignant neoplasm of breast: Secondary | ICD-10-CM | POA: Diagnosis not present

## 2022-05-28 DIAGNOSIS — L98492 Non-pressure chronic ulcer of skin of other sites with fat layer exposed: Secondary | ICD-10-CM | POA: Insufficient documentation

## 2022-05-28 DIAGNOSIS — Z86718 Personal history of other venous thrombosis and embolism: Secondary | ICD-10-CM | POA: Insufficient documentation

## 2022-05-28 DIAGNOSIS — L589 Radiodermatitis, unspecified: Secondary | ICD-10-CM | POA: Insufficient documentation

## 2022-05-28 DIAGNOSIS — Z9221 Personal history of antineoplastic chemotherapy: Secondary | ICD-10-CM | POA: Insufficient documentation

## 2022-05-28 DIAGNOSIS — Z9011 Acquired absence of right breast and nipple: Secondary | ICD-10-CM | POA: Insufficient documentation

## 2022-05-28 DIAGNOSIS — I10 Essential (primary) hypertension: Secondary | ICD-10-CM | POA: Insufficient documentation

## 2022-05-28 DIAGNOSIS — I890 Lymphedema, not elsewhere classified: Secondary | ICD-10-CM | POA: Insufficient documentation

## 2022-05-28 NOTE — Progress Notes (Signed)
[name], SWEARINGIN ([id number]) Visit Report for 05/28/2022 Chief Complaint Document Details Patient Name: Date of Service: [name] 05/28/2022 9:30 A M Medical Record Number: [id number] Patient Account Number: [id number] Date of Birth/Sex: Treating RN: [birth year] (71 y.o. 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 [name] Primary Care Provider: [name] Other Clinician: Referring Provider: Treating Provider/Extender: [name], [name]: 92 Debridement Performed for Assessment: Wound #1 Right Breast (mastectomy site) Performed By: Physician Worthy Keeler, PA Debridement Type: Debridement Level of Consciousness (Pre-procedure): Awake and Alert Pre-procedure Verification/Time Out Yes - 10:29 Taken: Start Time: 10:30 Pain Control: Lidocaine 4% T opical Solution T Area Debrided (L x W): otal 1.8 (cm) x 2.3 (cm) = 4.14 (cm) Tissue and other material debrided: Non-Viable, Slough, Subcutaneous, Slough Level: Skin/Subcutaneous Tissue Debridement Description: Excisional Instrument: Curette Bleeding: Minimum Hemostasis Achieved: Pressure End Time: 10:35 Response to Treatment: Procedure was tolerated well Level of Consciousness (Post- Awake and Alert procedure): Post Debridement  Measurements of Total Wound Length: (cm) 1.8 Width: (cm) 2.3 Depth: (cm) 0.5 Volume: (cm) 1.626 Character of Wound/Ulcer Post Debridement: Stable Post Procedure Diagnosis Same as Pre-procedure Electronic Signature(s) Signed: 05/28/2022 4:35:53 PM By: Worthy Keeler PA-C Signed: 05/28/2022 5:13:36 PM By: Lorrin Jackson Entered By: Lorrin Jackson on 05/28/2022 10:36:24 -------------------------------------------------------------------------------- HPI Details Patient Name: Date of Service: [name], [name] 05/28/2022 9:30 A M Medical Record Number: [id number] Patient Account Number: [id number] Date of Birth/Sex: Treating RN: 11-Sep-1951 (71 y.o. [name] Primary Care Provider: [name] Other Clinician: Referring Provider: Treating Provider/Extender: [name], [name]: 28 History of Present Illness HPI Description: 08/22/2020 upon evaluation today patient actually appears to be doing somewhat poorly in regard to her mastectomy site on the right chest wall. She had a mastectomy initially in 1998. She had chemotherapy only no radiation following. In 2002 she subsequently did undergo radiation for the first time and then subsequently in 2012 had repeat radiation after having had a finding that was consistent with a relapse of the cancer. 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 She also has radiation damage to the skin over the right chest wall where she had the mastectomy. She was referred to [place] by Dr. Matthew Saras in [facility] and her oncologist is Dr. Burney Gauze. Subsequently I want to make sure before we delve into this more deeply that the patient does not have any issues with a return of cancer that needs to be managed by them. Obviously she does have significant scar tissue which may be benefited secondary to the soft tissue radionecrosis by hyperbaric oxygen therapy in the absence  of any recurrence of the cancer. Nonetheless she does not remember exactly when her last PET scan was but it was not too recently she tells me. 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 The patient does have hypertension as well. She sees her oncologist next on December  6. 09/12/2020 on evaluation today patient actually appears to be doing excellent in regard to her wound under the right breast location. Currently this is measuring smaller in general seems to be doing very well. She tells me that the collagen is doing a good job and that the dressing that she has been putting on is not causing any troubles as far pulling on her skin or scar tissue is concerned all of which is excellent news. 10/03/2020 upon evaluation today patient appears to be doing well with regard to her surgical site from her mastectomy on the right breast region. She tells me that she has had very little bleeding nothing seems to be sticking badly and in general she has been extremely pleased with where things stand today. No fevers, chills, nausea, vomiting, or diarrhea. 10/24/2020 upon evaluation today patient actually appears to be doing excellent in regard to her wound. There is just a very small area still open and to be honest there was minimal slough noted on the surface of the wound nothing that requires sharp debridement. In general I feel like she is actually doing quite well and overall I am pleased. I do think we may switch to silver alginate dressing and also contemplating using a AandE ointment in order to help keep everything moist and the scar tissue region while the alginate keeps it dry enough to heal 11/14/2020 upon evaluation today patient appears to be doing well at this point in regard to her wound. I do feel like that she is making good progress again this is a very difficult region over the surgical site of the right chest wall at the site of mastectomy. Nonetheless I think that we are just a  very small area still open I think alginate is doing a good job helping to dry this up and I would recommend this such that we continue with this. 01/02/2021 on evaluation today patient's wound actually appears to be doing decently well. There does not appear to be any signs of active infection which is great news. She does have some slight slough buildup with biofilm on the surface of the wound mainly more biofilm. Nonetheless I was able to gently remove this with saline and gauze as well as a sterile Q-tip. She tolerated that today without complication. 01/30/2021 upon evaluation today patient appears to be doing well with regard to her wound although she still has quite a bit of trouble getting this to close I think the scar tissue and radiation damage is quite significant here unfortunately. There does not appear to be any signs of active infection which is great news. No fevers, chills, nausea, vomiting, or diarrhea. 02/27/2021 upon evaluation today patient appears to be doing excellent in regard to her wound. She has been tolerating the dressing changes without complication and in general I am extremely pleased with where things stand today. There does not appear to be any signs of infection which is great news. No fevers, chills, nausea, vomiting, or diarrhea. With that being said I do think that she still developing some slough buildup. I did discuss with her today the possibility of proceeding with HBO therapy. We have had this discussion before but she really is not quite committed to wanting to do that much and spend that much time in the chamber. She wants to still think about it. 03/27/2021 upon evaluation today patient appears to be doing about the same in regard to her wound. She still developing a lot of slough buildup  on the surface of the wound. I did try to clean that away to some degree today. She tolerated that without any pain or complication. Subsequently I am thinking we may  want to switch over to [facility] see if this may do better for her. Patient is in agreement with giving that a trial. 05/01/2021 upon evaluation today patient appears to be doing about the same in regard to her wounds. She has been tolerating the dressing changes without complication. Fortunately there does not appear to be any signs of active infection at this time which is great news. No fevers, chills, nausea, vomiting, or diarrhea.. 06/05/2021 upon evaluation today patient appears to be doing pretty well currently in regard to her wound at the mastectomy site right chest wall. Overall since we switch back to the alginate she tells me things have significantly improved she is much happier with where things stand currently. Fortunately there does not appear to be any signs of active infection which is great news. No fevers, chills, nausea, vomiting, or diarrhea. 07/10/2021 upon evaluation today patient unfortunately does not appear to be doing nearly as well as what she previously was. There does not appear to be any evidence of new epithelial growth she has a lot of necrotic tissue the base of the wound this is much more significant than what we previously noted. She also has been having increased pain and increased drainage. In general I am very concerned about this especially in light of the fact that she does have a history of breast cancer I want to ensure that were not looking at any cancerous type lesion at this point. Otherwise also think she does need some debridement we did do MolecuLight scanning today. 07/17/2021 upon evaluation today patient appears to be doing well with regard to her wound compared to last week although she still having some erythema I am concerned in this regard. I do think that she fortunately had a negative biopsy which is great news unfortunately she did have Pseudomonas noted as a bacteria present in the culture which I think is good and need to be addressed with  Levaquin. I Minna send that into the pharmacy today. We did repeat the MolecuLight screening. 07/31/2021 upon evaluation today patient's wound is actually showing signs of improvement which is great news. Fortunately there does not appear to be any signs of infection currently locally nor systemically and I am very pleased in that regard. With that being said the patient is having some issues here with continued necrotic tissue I think packing with the Dakin's moistened gauze dressing is still in the right leg ago. 08/14/2021 upon evaluation today patient appears to be doing well with regard to her wound all things considered I do not see any signs of significant infection which is great news. Fortunately there does not appear to be any evidence of infection which is also great news. In general I think that the patient is tolerating the dressing changes well. We been using a Dakin's moistened gauze. 08/28/2021 upon evaluation today patient appears to be doing well with regard to her wound. Worsening signs of definite improvement which is great news and overall very pleased with where we stand today. There is no signs of inflamed tissue or infection which is great as well and I think the Dakin's is doing a great job here. 09/11/2021 upon evaluation today patient appears to be doing well with regard to her wound all things considered. I am can perform some debridement today  to clear away some of the necrotic debris with that being said overall I think that she is making decently good progress here which is great news. There does not appear to be any signs of active infection also good news. That in fact is probably the best news in her mind. 09/25/2021 upon evaluation today patient appears to be doing decently well in regard to her wound. Overall I do not see any signs of infection and I think she is doing quite well. I do believe that we are making headway here although is very slow due to the scarring  and the depth of the wound this is a significantly deep wound. 10/16/2021 upon evaluation today patient appears to be doing about the same in regard to the wound on her right chest wall. Fortunately there does not appear to be any signs of active infection locally nor systemically which is great news. With that being said this still was not cleaning up quite as quickly as I would like to see. Fortunately I think that we can definitely give the Santyl another try we tried this in the past and unfortunately did not have a good result but again I think she was infected at that time as well which was part of the issue. Nonetheless I think currently that we will be able to go ahead and see what we can do about getting this little cleaner with the Santyl. 11/06/2021 upon evaluation today patient appears to be doing well with regard to her wound. This actually appears to be a little bit better with the Santyl I am happy in that regard. Fortunately I do not see any signs of active infection at this time. No fevers, chills, nausea, vomiting, or diarrhea. 12/04/2021 upon evaluation patient appears to be doing well with the Santyl at this point. I am very pleased with where we stand and I think that she is making good progress here. Fortunately I do not see any evidence of active infection locally or systemically at this time which is great news. No fevers, chills, nausea, vomiting, or diarrhea. 01/01/2022 upon evaluation today patient actually is making excellent progress with the Santyl. I am extremely pleased with where we stand today. I do not see any signs of infection. 01-29-2022 upon evaluation today patient appears to be doing well with regard to her wound. This is showing signs of some improvement. It is a little less deep than what it was. Size wise is also slightly smaller but again there still is a significant wound in this area. Fortunately I do not see any evidence of infection and she is doing quite  well. 02-26-2022 upon evaluation today patient appears to be doing well with regard to her wound. In fact this is actually the best that I have seen in a very long time. I do not see any evidence of active infection at this time locally or systemically which is great news and overall I think we are on the right track. Nonetheless I do believe that the patient is continuing to show evidence of improvement with the Santyl week by week. 03-26-2022 upon evaluation today patient appears to be doing well with regard to the wound on her right mastectomy site. She is actually showing signs of excellent improvement in fact there was some bleeding just with a little bit of light cleaning over the area. Fortunately I do not see any signs of active infection locally or systemically at this time which is great news. 04-30-2022 upon  evaluation today patient appears to be doing well currently in regard to her wound. She is actually showing some signs of improvement here which is great news. Still this is very very slow. Subsequently I do believe that she may benefit from the use of [place] topical antibiotics for short amount of time before applying a skin substitute I think EpiCord could be doing very well for her as far as that is concerned. I discussed that with her today. Fortunately I do not see any evidence of active infection locally or systemically at this time which is great news. No fevers, chills, nausea, vomiting, or diarrhea. 05-28-2022 upon evaluation today patient's wound is actually showing signs of significant improvement. Fortunately I do not see any evidence of infection at this time which is great news. No fevers, chills, nausea, vomiting, or diarrhea. With that being said I do believe that the patient is tolerating the dressing changes without complication. We been using the [facility] which I think is helpful. We do have approval for the [place]. Electronic Signature(s) Signed: 05/28/2022 10:44:33  AM By: Worthy Keeler PA-C Entered By: Worthy Keeler on 05/28/2022 10:44:33 -------------------------------------------------------------------------------- Physical Exam Details Patient Name: Date of Service: [name], [name] 05/28/2022 9:30 A M Medical Record Number: [id number] Patient Account Number: [id number] Date of Birth/Sex: Treating RN: [birth year] (71 y.o. [name] Primary Care Provider: [name] Other Clinician: Referring Provider: Treating Provider/Extender: [name], [name]: 53 Constitutional [name] and well-hydrated in no acute distress. Respiratory normal breathing without difficulty. Psychiatric this patient is able to make decisions and demonstrates good insight into disease process. Alert and Oriented x 3. pleasant and cooperative. Notes Upon inspection patient's wound bed actually showed signs of good granulation and epithelization at this point. Fortunately I do not see any evidence of active infection locally or systemically which is great news and overall I am extremely pleased with where we stand today. I did perform debridement clearway some of the slough and biofilm. Electronic Signature(s) Signed: 05/28/2022 10:44:51 AM By: Worthy Keeler PA-C Entered By: Worthy Keeler on 05/28/2022 10:44:51 -------------------------------------------------------------------------------- Physician Orders Details Patient Name: Date of Service: [name], [name] 05/28/2022 9:30 A M Medical Record Number: [id number] Patient Account Number: [id number] Date of Birth/Sex: Treating RN: [birth year] (71 y.o. [name] Primary Care Provider: [name] Other Clinician: Referring Provider: Treating Provider/Extender: [name], [name]: 34 Verbal / Phone Orders: No Diagnosis Coding ICD-10 Coding Code Description L98.492 Non-pressure chronic ulcer of skin of other sites with  fat layer exposed L58.9 Radiodermatitis, unspecified L59.8 Other specified disorders of the skin and subcutaneous tissue related to radiation I10 Essential (primary) hypertension Follow-up Appointments ppointment in 2 weeks. - 06/11/22 @ 10:15am with Orland Jarred, RN (Room 7) Return A Anesthetic (In clinic) Topical Lidocaine 5% applied to wound bed (In clinic) Topical Lidocaine 4% applied to wound bed Cellular or Tissue Based Products Cellular or Tissue Based Product Type: - Order Epicord for application on 7/82/95=621% covered Bathing/ Shower/ Hygiene May shower and wash wound with soap and water. - on days that dressing is changed Additional Orders / Instructions Follow Nutritious Diet Wound Treatment Wound #1 - Breast (mastectomy site) Wound Laterality: Right Cleanser: Soap and Water 1 x Per Day/30 Days Discharge Instructions: May shower and wash wound with dial antibacterial soap and water prior to dressing change. Peri-Wound Care: Skin Prep (DME) (Generic) 1 x Per Day/30 Days Discharge Instructions: Use skin  prep as directed Topical: topical compounding antibiotics 1 x Per Day/30 Days Discharge Instructions: [place]- compounding topical antibiotics once arrives apply under the santyl. Secondary Dressing: Cosmopor Wound Dressing, 4x4 (in/in) (Border Gauze) (DME) (Generic) 1 x Per Day/30 Days Discharge Instructions: Apply bordered gauze or foam border over primary dressing. Secondary Dressing: Woven Gauze Sponges 2x2 in (DME) (Generic) 1 x Per Day/30 Days Discharge Instructions: Apply over primary dressing, fold in corners to fill the space Electronic Signature(s) Signed: 05/28/2022 4:35:53 PM By: Worthy Keeler PA-C Signed: 05/28/2022 5:13:36 PM By: Lorrin Jackson Entered By: Lorrin Jackson on 05/28/2022 10:57:41 -------------------------------------------------------------------------------- Problem List Details Patient Name: Date of Service: [name], [name] 05/28/2022  9:30 A M Medical Record Number: [id number] Patient Account Number: [id number] Date of Birth/Sex: Treating RN: Feb 09, 1951 (71 y.o. [name] Primary Care Provider: [name] Other Clinician: Referring Provider: Treating Provider/Extender: [name], [name]: 59 Active Problems ICD-10 Encounter Code Description Active Date MDM Diagnosis L98.492 Non-pressure chronic ulcer of skin of other sites with fat layer exposed 08/22/2020 No Yes L58.9 Radiodermatitis, unspecified 08/22/2020 No Yes L59.8 Other specified disorders of the skin and subcutaneous tissue related to 08/22/2020 No Yes radiation I10 Essential (primary) hypertension 08/22/2020 No Yes Inactive Problems Resolved Problems Electronic Signature(s) Signed: 05/28/2022 4:35:53 PM By: Worthy Keeler PA-C Signed: 05/28/2022 5:13:36 PM By: Lorrin Jackson Previous Signature: 05/28/2022 9:15:09 AM Version By: Worthy Keeler PA-C Entered By: Lorrin Jackson on 05/28/2022 09:51:51 -------------------------------------------------------------------------------- Progress Note Details Patient Name: Date of Service: [name], [name] 05/28/2022 9:30 A M Medical Record Number: [id number] Patient Account Number: [id number] Date of Birth/Sex: Treating RN: [birth year] (71 y.o. 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 She had a mastectomy initially in 1998. She had chemotherapy only no radiation following. In 2002 she subsequently did undergo radiation for the first  time and then subsequently in 2012 had repeat radiation after having had a finding that was consistent with a relapse of the cancer. 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 She also has radiation damage to the skin over the right chest wall where she had the mastectomy. She was referred to [place] by Dr. Matthew Saras in [facility] and her oncologist is Dr. Burney Gauze. Subsequently I want to make sure before we delve into this more deeply that the patient does not have any issues with a return of cancer that needs to be managed by them. 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 Nonetheless she does not remember exactly when her last PET scan was but it was not too recently she tells me. 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 The patient does have hypertension as well. She sees her oncologist next on December 6. 09/12/2020 on evaluation today patient actually appears to be doing excellent in regard to her wound under the right breast location. Currently this is measuring smaller in general seems to be doing very well.  She tells me that the collagen is doing a good job and that the dressing that she has been putting on is not causing any troubles as far pulling on her skin or scar tissue is concerned all of which is excellent news. 10/03/2020 upon evaluation today patient appears to be doing well with regard to her surgical site from her mastectomy on the right breast region. She tells me that she has had very little bleeding nothing seems to be sticking badly and in general she has been extremely pleased with where things stand today. No fevers, chills, nausea, vomiting, or diarrhea. 10/24/2020 upon evaluation today patient actually appears to be doing excellent in regard to her wound.  There is just a very small area still open and to be honest there was minimal slough noted on the surface of the wound nothing that requires sharp debridement. In general I feel like she is actually doing quite well and overall I am pleased. I do think we may switch to silver alginate dressing and also contemplating using a AandE ointment in order to help keep everything moist and the scar tissue region while the alginate keeps it dry enough to heal 11/14/2020 upon evaluation today patient appears to be doing well at this point in regard to her wound. I do feel like that she is making good progress again this is a very difficult region over the surgical site of the right chest wall at the site of mastectomy. Nonetheless I think that we are just a very small area still open I think alginate is doing a good job helping to dry this up and I would recommend this such that we continue with this. 01/02/2021 on evaluation today patient's wound actually appears to be doing decently well. There does not appear to be any signs of active infection which is great news. She does have some slight slough buildup with biofilm on the surface of the wound mainly more biofilm. Nonetheless I was able to gently remove this with saline and gauze as well as a sterile Q-tip. She tolerated that today without complication. 01/30/2021 upon evaluation today patient appears to be doing well with regard to her wound although she still has quite a bit of trouble getting this to close I think the scar tissue and radiation damage is quite significant here unfortunately. There does not appear to be any signs of active infection which is great news. No fevers, chills, nausea, vomiting, or diarrhea. 02/27/2021 upon evaluation today patient appears to be doing excellent in regard to her wound. She has been tolerating the dressing changes without complication and in general I am extremely pleased with where things stand today. There does not  appear to be any signs of infection which is great news. No fevers, chills, nausea, vomiting, or diarrhea. With that being said I do think that she still developing some slough buildup. I did discuss with her today the possibility of proceeding with HBO therapy. We have had this discussion before but she really is not quite committed to wanting to do that much and spend that much time in the chamber. She wants to still think about it. 03/27/2021 upon evaluation today patient appears to be doing about the same in regard to her wound. She still developing a lot of slough buildup on the surface of the wound. I did try to clean that away to some degree today. She tolerated that without any pain or complication. Subsequently I am thinking we may want to switch  over to [facility] see if this may do better for her. Patient is in agreement with giving that a trial. 05/01/2021 upon evaluation today patient appears to be doing about the same in regard to her wounds. She has been tolerating the dressing changes without complication. Fortunately there does not appear to be any signs of active infection at this time which is great news. No fevers, chills, nausea, vomiting, or diarrhea.. 06/05/2021 upon evaluation today patient appears to be doing pretty well currently in regard to her wound at the mastectomy site right chest wall. Overall since we switch back to the alginate she tells me things have significantly improved she is much happier with where things stand currently. Fortunately there does not appear to be any signs of active infection which is great news. No fevers, chills, nausea, vomiting, or diarrhea. 07/10/2021 upon evaluation today patient unfortunately does not appear to be doing nearly as well as what she previously was. There does not appear to be any evidence of new epithelial growth she has a lot of necrotic tissue the base of the wound this is much more significant than what we previously noted. She  also has been having increased pain and increased drainage. In general I am very concerned about this especially in light of the fact that she does have a history of breast cancer I want to ensure that were not looking at any cancerous type lesion at this point. Otherwise also think she does need some debridement we did do Molecu[employer] scanning today. 07/17/2021 upon evaluation today patient appears to be doing well with regard to her wound compared to last week although she still having some erythema I am concerned in this regard. I do think that she fortunately had a negative biopsy which is great news unfortunately she did have Pseudomonas noted as a bacteria present in the culture which I think is good and need to be addressed with Levaquin. I Minna send that into the pharmacy today. We did repeat the Molecu[employer] screening. 07/31/2021 upon evaluation today patient's wound is actually showing signs of improvement which is great news. Fortunately there does not appear to be any signs of infection currently locally nor systemically and I am very pleased in that regard. With that being said the patient is having some issues here with continued necrotic tissue I think packing with the Dakin's moistened gauze dressing is still in the right leg ago. 08/14/2021 upon evaluation today patient appears to be doing well with regard to her wound all things considered I do not see any signs of significant infection which is great news. Fortunately there does not appear to be any evidence of infection which is also great news. In general I think that the patient is tolerating the dressing changes well. We been using a Dakin's moistened gauze. 08/28/2021 upon evaluation today patient appears to be doing well with regard to her wound. Worsening signs of definite improvement which is great news and overall very pleased with where we stand today. There is no signs of inflamed tissue or infection which is great as well  and I think the Dakin's is doing a great job here. 09/11/2021 upon evaluation today patient appears to be doing well with regard to her wound all things considered. I am can perform some debridement today to clear away some of the necrotic debris with that being said overall I think that she is making decently good progress here which is great news. There does not appear to be any  signs of active infection also good news. That in fact is probably the best news in her mind. 09/25/2021 upon evaluation today patient appears to be doing decently well in regard to her wound. Overall I do not see any signs of infection and I think she is doing quite well. I do believe that we are making headway here although is very slow due to the scarring and the depth of the wound this is a significantly deep wound. 10/16/2021 upon evaluation today patient appears to be doing about the same in regard to the wound on her right chest wall. Fortunately there does not appear to be any signs of active infection locally nor systemically which is great news. With that being said this still was not cleaning up quite as quickly as I would like to see. Fortunately I think that we can definitely give the Santyl another try we tried this in the past and unfortunately did not have a good result but again I think she was infected at that time as well which was part of the issue. Nonetheless I think currently that we will be able to go ahead and see what we can do about getting this little cleaner with the Santyl. 11/06/2021 upon evaluation today patient appears to be doing well with regard to her wound. This actually appears to be a little bit better with the Santyl I am happy in that regard. Fortunately I do not see any signs of active infection at this time. No fevers, chills, nausea, vomiting, or diarrhea. 12/04/2021 upon evaluation patient appears to be doing well with the Santyl at this point. I am very pleased with where we stand  and I think that she is making good progress here. Fortunately I do not see any evidence of active infection locally or systemically at this time which is great news. No fevers, chills, nausea, vomiting, or diarrhea. 01/01/2022 upon evaluation today patient actually is making excellent progress with the Santyl. I am extremely pleased with where we stand today. I do not see any signs of infection. 01-29-2022 upon evaluation today patient appears to be doing well with regard to her wound. This is showing signs of some improvement. It is a little less deep than what it was. Size wise is also slightly smaller but again there still is a significant wound in this area. Fortunately I do not see any evidence of infection and she is doing quite well. 02-26-2022 upon evaluation today patient appears to be doing well with regard to her wound. In fact this is actually the best that I have seen in a very long time. I do not see any evidence of active infection at this time locally or systemically which is great news and overall I think we are on the right track. Nonetheless I do believe that the patient is continuing to show evidence of improvement with the Santyl week by week. 03-26-2022 upon evaluation today patient appears to be doing well with regard to the wound on her right mastectomy site. She is actually showing signs of excellent improvement in fact there was some bleeding just with a little bit of light cleaning over the area. Fortunately I do not see any signs of active infection locally or systemically at this time which is great news. 04-30-2022 upon evaluation today patient appears to be doing well currently in regard to her wound. She is actually showing some signs of improvement here which is great news. Still this is very very slow. Subsequently I  do believe that she may benefit from the use of [place] topical antibiotics for short amount of time before applying a skin substitute I think EpiCord  could be doing very well for her as far as that is concerned. I discussed that with her today. Fortunately I do not see any evidence of active infection locally or systemically at this time which is great news. No fevers, chills, nausea, vomiting, or diarrhea. 05-28-2022 upon evaluation today patient's wound is actually showing signs of significant improvement. Fortunately I do not see any evidence of infection at this time which is great news. No fevers, chills, nausea, vomiting, or diarrhea. With that being said I do believe that the patient is tolerating the dressing changes without complication. We been using the [facility] which I think is helpful. We do have approval for the [place]. Objective Constitutional [name] and well-hydrated in no acute distress. Vitals Time Taken: 9:38 AM, Height: 63 in, Weight: 176 lbs, BMI: 31.2, Temperature: 97.8 F, Pulse: 73 bpm, Respiratory Rate: 18 breaths/min, Blood Pressure: 158/72 mmHg. Respiratory normal breathing without difficulty. Psychiatric this patient is able to make decisions and demonstrates good insight into disease process. Alert and Oriented x 3. pleasant and cooperative. General Notes: Upon inspection patient's wound bed actually showed signs of good granulation and epithelization at this point. Fortunately I do not see any evidence of active infection locally or systemically which is great news and overall I am extremely pleased with where we stand today. I did perform debridement clearway some of the slough and biofilm. Integumentary (Hair, Skin) Wound #1 status is Open. Original cause of wound was Radiation Burn. The date acquired was: 07/26/2020. The wound has been in treatment 92 weeks. The wound is located on the Right Breast (mastectomy site). The wound measures 1.8cm length x 2.3cm width x 0.5cm depth; 3.252cm^2 area and 1.626cm^3 volume. There is Fat Layer (Subcutaneous Tissue) exposed. There is no tunneling or undermining  noted. There is a medium amount of serosanguineous drainage noted. The wound margin is distinct with the outline attached to the wound base. There is medium (34-66%) pink granulation within the wound bed. There is a medium (34-66%) amount of necrotic tissue within the wound bed including Adherent Slough. 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 There was a Excisional Skin/Subcutaneous Tissue Debridement with a total area of 4.14 sq cm performed by Worthy Keeler, PA. With the following instrument(s): Curette to remove Non-Viable tissue/material. Material removed includes Subcutaneous Tissue and Slough and after achieving pain control using Lidocaine 4% Topical Solution. No specimens were taken. A time out was conducted at 10:29, prior to the start of the procedure. A Minimum amount of bleeding was controlled with Pressure. The procedure was tolerated well. Post Debridement Measurements: 1.8cm length x 2.3cm width x 0.5cm depth; 1.626cm^3 volume. Character of Wound/Ulcer Post Debridement is stable. Post procedure Diagnosis Wound #1: Same as Pre-Procedure Plan 1. I am good recommend that we go ahead and continue with the wound care measures as before and the patient is in agreement with plan this includes the use of the [facility] topical antibiotics followed by the gauze to secure in place. 2. I am good recommend as well she continue to cover this with ABD  pad as before. 3. I am also going to go ahead and order the [place] for the next time she comes in order to give this a trial she is a little apprehensive about how it will stay in place but I think we  can make it do so. We will see patient back for reevaluation in 2 weeks here in the clinic. If anything worsens or changes patient will contact our office for additional recommendations. Electronic Signature(s) Signed: 05/28/2022 10:46:02 AM By: Worthy Keeler PA-C Entered By: Worthy Keeler on 05/28/2022 10:46:01 -------------------------------------------------------------------------------- SuperBill Details Patient Name: Date of Service: [name], [name] 05/28/2022 Medical Record Number: [id number] Patient Account Number: [id number] Date of Birth/Sex: Treating RN: 01/15/1951 (71 y.o. [name] Primary Care Provider: [name] Other Clinician: Referring Provider: Treating Provider/Extender: [name], [name]: 92 Diagnosis Coding ICD-10 Codes Code Description [phone] Non-pressure chronic ulcer of skin of other sites with fat layer exposed L58.9 Radiodermatitis, unspecified L59.8 Other specified disorders of the skin and subcutaneous tissue related to radiation I10 Essential (primary) hypertension Facility Procedures The patient participates with Medicare or their insurance follows the Medicare Facility Guidelines: CPT4 Code Description Modifier Quantity [id number] [place] TISSUE 20 SQ CM/< 1 ICD-10 Diagnosis Description L98.492 Non-pressure chronic ulcer of  skin of other sites with fat layer exposed Physician Procedures Electronic Signature(s) Signed: 05/28/2022 4:35:53 PM By: Worthy Keeler PA-C Signed: 05/28/2022 5:13:36 PM By: Lorrin Jackson Previous Signature: 05/28/2022 10:46:20 AM Version By: Worthy Keeler PA-C Entered By: Lorrin Jackson on 05/28/2022 10:46:26

## 2022-05-29 ENCOUNTER — Telehealth: Payer: Self-pay

## 2022-05-29 ENCOUNTER — Inpatient Hospital Stay: Payer: Medicare Other

## 2022-05-29 ENCOUNTER — Inpatient Hospital Stay: Payer: Medicare Other | Attending: Hematology & Oncology

## 2022-05-29 ENCOUNTER — Encounter: Payer: Self-pay | Admitting: Hematology & Oncology

## 2022-05-29 ENCOUNTER — Other Ambulatory Visit: Payer: Self-pay

## 2022-05-29 ENCOUNTER — Inpatient Hospital Stay (HOSPITAL_BASED_OUTPATIENT_CLINIC_OR_DEPARTMENT_OTHER): Payer: Medicare Other | Admitting: Hematology & Oncology

## 2022-05-29 VITALS — BP 149/56 | HR 70 | Temp 98.2°F | Resp 18 | Ht 64.0 in | Wt 190.1 lb

## 2022-05-29 DIAGNOSIS — D509 Iron deficiency anemia, unspecified: Secondary | ICD-10-CM | POA: Insufficient documentation

## 2022-05-29 DIAGNOSIS — Z170 Estrogen receptor positive status [ER+]: Secondary | ICD-10-CM | POA: Diagnosis not present

## 2022-05-29 DIAGNOSIS — C50011 Malignant neoplasm of nipple and areola, right female breast: Secondary | ICD-10-CM

## 2022-05-29 DIAGNOSIS — Z86718 Personal history of other venous thrombosis and embolism: Secondary | ICD-10-CM | POA: Insufficient documentation

## 2022-05-29 DIAGNOSIS — Z7982 Long term (current) use of aspirin: Secondary | ICD-10-CM | POA: Diagnosis not present

## 2022-05-29 DIAGNOSIS — D631 Anemia in chronic kidney disease: Secondary | ICD-10-CM

## 2022-05-29 DIAGNOSIS — Z79811 Long term (current) use of aromatase inhibitors: Secondary | ICD-10-CM | POA: Insufficient documentation

## 2022-05-29 DIAGNOSIS — Z79899 Other long term (current) drug therapy: Secondary | ICD-10-CM | POA: Insufficient documentation

## 2022-05-29 DIAGNOSIS — C50911 Malignant neoplasm of unspecified site of right female breast: Secondary | ICD-10-CM | POA: Insufficient documentation

## 2022-05-29 LAB — CBC WITH DIFFERENTIAL (CANCER CENTER ONLY)
Abs Immature Granulocytes: 0.02 10*3/uL (ref 0.00–0.07)
Basophils Absolute: 0.1 10*3/uL (ref 0.0–0.1)
Basophils Relative: 1 %
Eosinophils Absolute: 0.3 10*3/uL (ref 0.0–0.5)
Eosinophils Relative: 4 %
HCT: 34.5 % — ABNORMAL LOW (ref 36.0–46.0)
Hemoglobin: 11 g/dL — ABNORMAL LOW (ref 12.0–15.0)
Immature Granulocytes: 0 %
Lymphocytes Relative: 30 %
Lymphs Abs: 1.8 10*3/uL (ref 0.7–4.0)
MCH: 28.8 pg (ref 26.0–34.0)
MCHC: 31.9 g/dL (ref 30.0–36.0)
MCV: 90.3 fL (ref 80.0–100.0)
Monocytes Absolute: 0.5 10*3/uL (ref 0.1–1.0)
Monocytes Relative: 8 %
Neutro Abs: 3.5 10*3/uL (ref 1.7–7.7)
Neutrophils Relative %: 57 %
Platelet Count: 189 10*3/uL (ref 150–400)
RBC: 3.82 MIL/uL — ABNORMAL LOW (ref 3.87–5.11)
RDW: 15.1 % (ref 11.5–15.5)
WBC Count: 6.2 10*3/uL (ref 4.0–10.5)
nRBC: 0 % (ref 0.0–0.2)

## 2022-05-29 LAB — IRON AND IRON BINDING CAPACITY (CC-WL,HP ONLY)
Iron: 60 ug/dL (ref 28–170)
Saturation Ratios: 26 % (ref 10.4–31.8)
TIBC: 228 ug/dL — ABNORMAL LOW (ref 250–450)
UIBC: 168 ug/dL (ref 148–442)

## 2022-05-29 LAB — CMP (CANCER CENTER ONLY)
ALT: 12 U/L (ref 0–44)
AST: 13 U/L — ABNORMAL LOW (ref 15–41)
Albumin: 4.1 g/dL (ref 3.5–5.0)
Alkaline Phosphatase: 65 U/L (ref 38–126)
Anion gap: 7 (ref 5–15)
BUN: 18 mg/dL (ref 8–23)
CO2: 29 mmol/L (ref 22–32)
Calcium: 9.3 mg/dL (ref 8.9–10.3)
Chloride: 106 mmol/L (ref 98–111)
Creatinine: 0.68 mg/dL (ref 0.44–1.00)
GFR, Estimated: >60 mL/min (ref >60)
Glucose, Bld: 106 mg/dL — ABNORMAL HIGH (ref 70–99)
Potassium: 3.7 mmol/L (ref 3.5–5.1)
Sodium: 142 mmol/L (ref 135–145)
Total Bilirubin: 0.6 mg/dL (ref 0.3–1.2)
Total Protein: 7.6 g/dL (ref 6.5–8.1)

## 2022-05-29 LAB — RETICULOCYTES
Immature Retic Fract: 13.5 % (ref 2.3–15.9)
RBC.: 3.84 MIL/uL — ABNORMAL LOW (ref 3.87–5.11)
Retic Count, Absolute: 60.3 10*3/uL (ref 19.0–186.0)
Retic Ct Pct: 1.6 % (ref 0.4–3.1)

## 2022-05-29 LAB — FERRITIN: Ferritin: 432 ng/mL — ABNORMAL HIGH (ref 11–307)

## 2022-05-29 NOTE — Telephone Encounter (Signed)
-----   Message from [name], MD sent at 05/29/2022  1:11 PM EDT ----- Call let her know that the iron level is okay.  Thanks.  Laurey Arrow

## 2022-05-29 NOTE — Progress Notes (Signed)
[name], AVENI ([id number]) Visit Report for 05/28/2022 Arrival Information Details Patient Name: Date of Service: [name] YE 05/28/2022 9:30 A M Medical Record Number: [id number] Patient Account Number: [id number] Date of Birth/Sex: Treating RN: [birth year] (71 y.o. [name] Primary Care Tangelia Sanson: [name] Other Clinician: Referring Maxxon Schwanke: Treating Dennette Faulconer/Extender: [name], [name]: 32 Visit Information History Since Last Visit Added or deleted any medications: No Patient Arrived: Ambulatory Any new allergies or adverse reactions: No Arrival Time: 09:39 Had a fall or experienced change in No Transfer Assistance: None activities of daily living that may affect Patient Identification Verified: Yes risk of falls: Secondary Verification Process Completed: Yes Signs or symptoms of abuse/neglect since last visito No Patient Requires Transmission-Based Precautions: No Hospitalized since last visit: No Patient Has Alerts: No Implantable device outside of the clinic excluding No cellular tissue based products placed in the center since last visit: Has Dressing in Place as Prescribed: Yes Pain Present Now: No Electronic Signature(s) Signed: 05/28/2022 5:13:36 PM By: Lorrin Jackson Entered By: Lorrin Jackson on 05/28/2022 09:39:35 -------------------------------------------------------------------------------- Encounter Discharge Information Details Patient Name: Date of Service: [name], [name] 05/28/2022 9:30 A M Medical Record Number: [id number] Patient Account Number: [id number] Date of Birth/Sex: Treating RN: 07-12-1951 (71 y.o. [name] Primary Care Vernis Cabacungan: [name] Other Clinician: Referring Jarrod Mcenery: Treating Robt Okuda/Extender: [name], [name]: 33 Encounter Discharge Information Items Post Procedure Vitals Discharge Condition: Stable Temperature (F): 97.8 Ambulatory  Status: Ambulatory Pulse (bpm): 73 Discharge Destination: Home Respiratory Rate (breaths/min): 18 Transportation: Private Auto Blood Pressure (mmHg): 158/72 Schedule Follow-up Appointment: Yes Clinical Summary of Care: Provided on 05/28/2022 Form Type Recipient Paper Patient Patient Electronic Signature(s) Signed: 05/28/2022 5:13:36 PM By: Lorrin Jackson Entered By: Lorrin Jackson on 05/28/2022 10:52:38 -------------------------------------------------------------------------------- Lower Extremity Assessment Details Patient Name: Date of Service: [name], [name] 05/28/2022 9:30 A M Medical Record Number: [id number] Patient Account Number: [id number] Date of Birth/Sex: Treating RN: Nov 17, 1950 (71 y.o. [name] Primary Care Carliyah Cotterman: [name] Other Clinician: Referring Prestyn Stanco: Treating Starleen Trussell/Extender: [name], [name]: 22 Electronic Signature(s) Signed: 05/28/2022 5:13:36 PM By: Lorrin Jackson Entered By: Lorrin Jackson on 05/28/2022 09:39:52 -------------------------------------------------------------------------------- Multi-Disciplinary Care Plan Details Patient Name: Date of Service: [name], [name] 05/28/2022 9:30 A M Medical Record Number: [id number] Patient Account Number: [id number] Date of Birth/Sex: Treating RN: [birth year] (71 y.o. [name] Primary Care Darnisha Vernet: [name] Other Clinician: Referring Shakeyla Giebler: Treating Suriyah Vergara/Extender: [name], [name] in Treatment: 37 [place] reviewed with physician Active Inactive Necrotic Tissue Nursing Diagnoses: Knowledge deficit related to management of necrotic/devitalized tissue Goals: Necrotic/devitalized tissue will be minimized in the wound bed Date Initiated: 07/10/2021 Target Resolution Date: 06/05/2022 Goal Status: Active Interventions: Provide education on necrotic tissue and debridement  process Treatment Activities: Excisional debridement : 07/10/2021 Notes: 01/01/22: Continues to use Santyl Wound/Skin Impairment Nursing Diagnoses: Impaired tissue integrity Knowledge deficit related to ulceration/compromised skin integrity Goals: Patient/caregiver will verbalize understanding of skin care regimen Date Initiated: 08/22/2020 Target Resolution Date: 06/12/2022 Goal Status: Active Ulcer/skin breakdown will have a volume reduction of 30% by week 4 Date Initiated: 08/22/2020 Date Inactivated: 10/03/2020 Target Resolution Date: 09/19/2020 Goal Status: Met Ulcer/skin breakdown will have a volume reduction of 50% by week 8 Date Initiated: 06/05/2021 Date Inactivated: 07/17/2021 Target Resolution Date: 07/03/2021 Goal Status: Unmet Unmet Reason: infection Interventions: Assess patient/caregiver ability to obtain necessary supplies Assess patient/caregiver ability to  perform ulcer/skin care regimen upon admission and as needed Assess ulceration(s) every visit Treatment Activities: Skin care regimen initiated : 08/22/2020 Topical wound management initiated : 08/22/2020 Notes: 06/05/21: Wound care regimen ongoing. Electronic Signature(s) Signed: 05/28/2022 5:13:36 PM By: Lorrin Jackson Entered By: Lorrin Jackson on 05/28/2022 09:52:05 -------------------------------------------------------------------------------- Pain Assessment Details Patient Name: Date of Service: [name], [name] 05/28/2022 9:30 A M Medical Record Number: [id number] Patient Account Number: [id number] Date of Birth/Sex: Treating RN: May 12, 1951 (71 y.o. [name] Primary Care Mikaili Flippin: [name] Other Clinician: Referring Alaisha Eversley: Treating Stepen Prins/Extender: [name], [name]: 65 Active Problems Location of Pain Severity and Description of Pain Patient Has Paino No Site Locations Pain Management and Medication Current Pain Management: Electronic  Signature(s) Signed: 05/28/2022 5:13:36 PM By: Lorrin Jackson Entered By: Lorrin Jackson on 05/28/2022 09:39:44 -------------------------------------------------------------------------------- Patient/Caregiver Education Details Patient Name: Date of Service: [name], [name] 8/16/2023andnbsp9:30 A M Medical Record Number: [id number] Patient Account Number: [id number] Date of Birth/Gender: Treating RN: Nov 11, 1950 (71 y.o. [name] Primary Care Physician: [name] Other Clinician: Referring Physician: Treating Physician/Extender: [name], [name] in Treatment: 56 Education Assessment Education Provided To: Patient Education Topics Provided Wound/Skin Impairment: Methods: Demonstration, Explain/Verbal, Printed Responses: State content correctly Electronic Signature(s) Signed: 05/28/2022 5:13:36 PM By: Lorrin Jackson Entered By: Lorrin Jackson on 05/28/2022 09:52:40 -------------------------------------------------------------------------------- Wound Assessment Details Patient Name: Date of Service: [name], [name] 05/28/2022 9:30 A M Medical Record Number: [id number] Patient Account Number: [id number] Date of Birth/Sex: Treating RN: 1951-01-19 (71 y.o. [name] Primary Care Nixon Sparr: [name] Other Clinician: Referring Demarquis Osley: Treating Detrell Umscheid/Extender: [name], [name]: 92 Wound Status Wound Number: 1 Primary Soft Tissue Radionecrosis Etiology: Wound Location: Right Breast (mastectomy site) Wound Open Wounding Event: Radiation Burn Status: Date Acquired: 07/26/2020 Comorbid Anemia, Lymphedema, Deep Vein Thrombosis, Hypertension, Weeks Of Treatment: 92 History: Peripheral Venous Disease, Osteoarthritis, Received Clustered Wound: No Chemotherapy, Received Radiation, Confinement Anxiety Photos Wound Measurements Length: (cm) 1.8 Width: (cm) 2.3 Depth: (cm) 0.5 Area: (cm)  3.252 Volume: (cm) 1.626 % Reduction in Area: -112.3% % Reduction in Volume: -962.7% Epithelialization: None Tunneling: No Undermining: No Wound Description Classification: Full Thickness Without Exposed Support Structures Wound Margin: Distinct, outline attached Exudate Amount: Medium Exudate Type: Serosanguineous Exudate Color: red, brown Foul Odor After Cleansing: No Slough/Fibrino Yes Wound Bed Granulation Amount: Medium (34-66%) Exposed Structure Granulation Quality: Pink Fascia Exposed: No Necrotic Amount: Medium (34-66%) Fat Layer (Subcutaneous Tissue) Exposed: Yes Necrotic Quality: Adherent Slough Tendon Exposed: No Muscle Exposed: No Joint Exposed: No Bone Exposed: No Treatment Notes Wound #1 (Breast (mastectomy site)) Wound Laterality: Right Cleanser Soap and Water Discharge Instruction: May shower and wash wound with dial antibacterial soap and water prior to dressing change. Peri-Wound Care Skin Prep Discharge Instruction: Use skin prep as directed Topical topical compounding antibiotics Discharge Instruction: [place]- compounding topical antibiotics once arrives apply under the santyl. Primary Dressing Secondary Dressing Cosmopor Wound Dressing, 4x4 (in/in) (Border Gauze) Discharge Instruction: Apply bordered gauze or foam border over primary dressing. Woven Gauze Sponges 2x2 in Discharge Instruction: Apply over primary dressing, fold in corners to fill the space Secured With Compression Wrap Compression Stockings Add-Ons Electronic Signature(s) Signed: 05/28/2022 5:13:36 PM By: Lorrin Jackson Entered By: Lorrin Jackson on 05/28/2022 09:46:14 -------------------------------------------------------------------------------- Vitals Details Patient Name: Date of Service: [name], [name] 05/28/2022 9:30 A M Medical Record Number: [id number] Patient Account Number: [id number] Date of Birth/Sex: Treating RN: 13-May-1951 (  71 y.o. [name] Primary Care Camyah Pultz: [name] Other Clinician: Referring [place]: Treating Amerika Nourse/Extender: [name], [name]: 92 Vital Signs Time Taken: 09:38 Temperature (F): 97.8 Height (in): 63 Pulse (bpm): 73 Weight (lbs): 176 Respiratory Rate (breaths/min): 18 Body Mass Index (BMI): 31.2 Blood Pressure (mmHg): 158/72 Reference Range: 80 - 120 mg / dl Electronic Signature(s) Signed: 05/28/2022 5:13:36 PM By: Lorrin Jackson Entered By: Lorrin Jackson on 05/28/2022 09:39:07

## 2022-05-29 NOTE — Telephone Encounter (Signed)
Called and informed patient of lab results, patient verbalized understanding and denies any questions or concerns at this time.   

## 2022-05-29 NOTE — Progress Notes (Signed)
Hematology and Oncology Follow Up Visit  [name] [id number] 01/20/1951 71 y.o. 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 Aromasin 25 mg p.o. daily - on hold -- start on 03/18/2021 -- hold on 01/02/1022 2. Aspirin 81 mg p.o. daily 3.  Feraheme-510 mg IV as needed.  Last dose given on 08/23/2021 4.  Aranesp 300 mcg sq for Hgb less than 11 -- start on 02/17/2022   Interim History:  [name] is here today for follow-up.  As expected, she has responded very well to the Aranesp.  She has not had an Aranesp injection for about 3 months.  She does not need 1 today.  She is still dealing with the wound over on the right anterior chest wall.  She is applying some gentamicin powder now.  She has had no problem with fever.  She does have the arthritis that she is dealing with.  She does have the back issues.  When we last saw her, her ferritin was 465 with an iron saturation of 29%.  There is no problems with bowels or bladder.  She has had no bleeding.  There is been no leg swelling.  She does have the lymphedema of the right arm.  She is on baby aspirin for the superficial thrombus of the right thigh.  Currently, I would say her performance status is probably ECOG 1.       Medications:  Allergies as of 05/29/2022       Reactions   Contrast Media [iodinated Contrast Media] Hives, Shortness Of Breath, Nausea And Vomiting   Allergic reaction even after pre-meds.   Metrizamide Hives, Shortness Of Breath, Nausea And Vomiting   Allergic reaction even after pre-meds.   Other Shortness Of Breath, Nausea And Vomiting, Rash   Allergic reaction even after pre-meds.   Dye Fdc Blue [brilliant Blue Fcf (fd&c Blue #1)] Hives, Nausea Only   Fd&c Blue #1 (brilliant Blue Fcf) Hives, Nausea Only         Medication List        Accurate as of May 29, 2022 10:54 AM. If you have any questions, ask your nurse or doctor.          STOP taking these medications    Santyl 250 UNIT/GM ointment Generic drug: collagenase Stopped by: [name], MD       TAKE these medications    amLODipine 10 MG tablet Commonly known as: NORVASC TAKE 1 TABLET BY MOUTH DAILY   aspirin EC 81 MG tablet Take 81 mg by mouth daily.   candesartan 32 MG tablet Commonly known as: ATACAND Take 32 mg by mouth daily.   celecoxib 200 MG capsule Commonly known as: CELEBREX Take 200 mg by mouth daily as needed.   exemestane 25 MG tablet Commonly known as: AROMASIN TAKE 1 TABLET BY MOUTH EVERY DAY   Gentamicin Sulfate Powd Apply 1 Application topically daily.   hydrochlorothiazide 25 MG tablet Commonly known as: HYDRODIURIL Take 25 mg by mouth daily.   metoprolol tartrate 100 MG tablet Commonly known as: LOPRESSOR Take 100 mg by mouth daily.   simvastatin 20 MG tablet Commonly known as: ZOCOR Take 20 mg by mouth daily.   Vitamin D (Ergocalciferol) 1.25 MG (50000 UNIT) Caps capsule Commonly known as: DRISDOL TAKE 1 CAPSULE BY MOUTH  ONE TIME PER WEEK        Allergies:  Allergies  Allergen Reactions   Contrast Media [Iodinated Contrast Media] Hives, Shortness Of Breath and Nausea And Vomiting    Allergic reaction even after pre-meds.   Metrizamide Hives, Shortness Of Breath and Nausea And Vomiting    Allergic reaction even after pre-meds.   Other Shortness Of Breath, Nausea And Vomiting and Rash    Allergic reaction even after pre-meds.   Dye Fdc Blue [Brilliant Blue Fcf (Fd&C Blue #1)] Hives and Nausea Only   Fd&C Blue #1 (Brilliant Blue Fcf) Hives and Nausea Only    Past Medical History, Surgical history, Social history, and Family History were reviewed and updated.  Review of Systems: Review of Systems  Constitutional: Negative.   HENT:  Positive for hearing loss.    Eyes:  Positive for discharge.  Respiratory: Negative.    Cardiovascular: Negative.   Gastrointestinal: Negative.   Genitourinary: Negative.   Musculoskeletal: Negative.   Skin: Negative.   Neurological: Negative.   Endo/Heme/Allergies: Negative.   Psychiatric/Behavioral: Negative.       Physical Exam:  height is '5\' 4"'$  (1.626 m) and weight is 190 lb 1.3 oz (86.2 kg). Her oral temperature is 98.2 F (36.8 C). Her blood pressure is 149/56 (abnormal) and her pulse is 70. Her respiration is 18 and oxygen saturation is 100%.   Wt Readings from Last 3 Encounters:  05/29/22 190 lb 1.3 oz (86.2 kg)  04/18/22 188 lb 1.9 oz (85.3 kg)  03/17/22 187 lb (84.8 kg)   Physical Exam Vitals reviewed.  Constitutional:      Comments: On her breast exam, her left breast is without any masses, edema or erythema.  There is no left axillary adenopathy.  There is no discharge from the nipple on the left breast.  Right chest wall shows radiation dermatitis which is about the same.  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  There is no obvious exudate.  There is no tenderness.  There is no right axillary adenopathy.  HENT:     Head: Normocephalic and atraumatic.  Eyes:     Pupils: Pupils are equal, round, and reactive to light.  Cardiovascular:     Rate and Rhythm: Normal rate and regular rhythm.     Heart sounds: Normal heart sounds.  Pulmonary:     Effort: Pulmonary effort is normal.     Breath sounds: Normal breath sounds.  Abdominal:     General: Bowel sounds are normal.     Palpations: Abdomen is soft.  Musculoskeletal:        General: No tenderness or deformity. Normal range of motion.     Cervical back: Normal range of motion.  Lymphadenopathy:     Cervical: No cervical adenopathy.  Skin:    General: Skin is warm and dry.     Findings: No erythema or rash.  Neurological:     Mental Status: She is alert and oriented to person, place, and time.  Psychiatric:         Behavior: Behavior normal.        Thought Content: Thought content normal.        Judgment: Judgment normal.     Lab Results  Component Value Date   WBC 6.2 05/29/2022   HGB 11.0 (L) 05/29/2022   HCT 34.5 (L) 05/29/2022   MCV 90.3 05/29/2022   PLT 189 05/29/2022   Lab Results  Component Value Date   FERRITIN  465 (H) 04/18/2022   IRON 68 04/18/2022   TIBC 232 (L) 04/18/2022   UIBC 164 04/18/2022   IRONPCTSAT 29 04/18/2022   Lab Results  Component Value Date   RETICCTPCT 1.6 05/29/2022   RBC 3.84 (L) 05/29/2022   RETICCTABS 55.2 06/20/2011   No results found for: "KPAFRELGTCHN", "LAMBDASER", "KAPLAMBRATIO" No results found for: "IGGSERUM", "IGA", "IGMSERUM" Lab Results  Component Value Date   TOTALPROTELP 6.6 08/14/2010   ALBUMINELP 53.8 (L) 08/14/2010   A1GS 5.3 (H) 08/14/2010   A2GS 14.3 (H) 08/14/2010   BETS 5.8 08/14/2010   BETA2SER 6.3 08/14/2010   GAMS 14.5 08/14/2010   MSPIKE NOT DET 08/14/2010   SPEI * 08/14/2010     Chemistry      Component Value Date/Time   NA 142 05/29/2022 0940   NA 146 (H) 07/23/2017 0900   NA 144 02/18/2017 0901   K 3.7 05/29/2022 0940   K 3.7 07/23/2017 0900   K 3.7 02/18/2017 0901   CL 106 05/29/2022 0940   CL 107 07/23/2017 0900   CO2 29 05/29/2022 0940   CO2 31 07/23/2017 0900   CO2 27 02/18/2017 0901   BUN 18 05/29/2022 0940   BUN 16 07/23/2017 0900   BUN 17.7 02/18/2017 0901   CREATININE 0.68 05/29/2022 0940   CREATININE 0.8 07/23/2017 0900   CREATININE 0.7 02/18/2017 0901      Component Value Date/Time   CALCIUM 9.3 05/29/2022 0940   CALCIUM 9.3 07/23/2017 0900   CALCIUM 9.3 02/18/2017 0901   ALKPHOS 65 05/29/2022 0940   ALKPHOS 67 07/23/2017 0900   ALKPHOS 78 02/18/2017 0901   AST 13 (L) 05/29/2022 0940   AST 13 02/18/2017 0901   ALT 12 05/29/2022 0940   ALT 20 07/23/2017 0900   ALT 12 02/18/2017 0901   BILITOT 0.6 05/29/2022 0940   BILITOT 0.54 02/18/2017 0901       Impression and Plan: [name] is  a very pleasant 71 yo caucasian female with history of locally recurrent adenocarcinoma of the right breast with resection and radiation.   We will continue to hold the Aromasin right now.  I am not sure if she would tolerate this.  Again she has these arthritic issues.  However, I do not think this is anything from the Aromasin that she was taking.  She has not taken Aromasin now for probably 5-6 months.  We will see what her iron levels look like.  I would like to see that this would be okay.  We will plan to get her back now I think in early October.  I want to try to work things out so that we we will give her a dose of Aranesp We will see her and then get her through the holiday season.   8/17/202310:54 AM

## 2022-06-11 ENCOUNTER — Encounter (HOSPITAL_BASED_OUTPATIENT_CLINIC_OR_DEPARTMENT_OTHER): Payer: Medicare Other | Admitting: Physician Assistant

## 2022-06-11 DIAGNOSIS — Z9221 Personal history of antineoplastic chemotherapy: Secondary | ICD-10-CM | POA: Diagnosis not present

## 2022-06-11 DIAGNOSIS — L589 Radiodermatitis, unspecified: Secondary | ICD-10-CM | POA: Diagnosis not present

## 2022-06-11 DIAGNOSIS — L598 Other specified disorders of the skin and subcutaneous tissue related to radiation: Secondary | ICD-10-CM | POA: Diagnosis not present

## 2022-06-11 DIAGNOSIS — Z86718 Personal history of other venous thrombosis and embolism: Secondary | ICD-10-CM | POA: Diagnosis not present

## 2022-06-11 DIAGNOSIS — Z9011 Acquired absence of right breast and nipple: Secondary | ICD-10-CM | POA: Diagnosis not present

## 2022-06-11 DIAGNOSIS — I890 Lymphedema, not elsewhere classified: Secondary | ICD-10-CM | POA: Diagnosis not present

## 2022-06-11 NOTE — Progress Notes (Addendum)
LARONICA, BHAGAT ([id number]) Visit Report for 06/11/2022 Chief Complaint Document Details Patient Name: Date of Service: GO [name] 06/11/2022 10:15 A M Medical Record Number: [id number] Patient Account Number: [id number] Date of Birth/Sex: Treating RN: 06-Jan-1951 (71 y.o. 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 [name] Primary Care Provider: [name] Other Clinician: Referring Provider: Treating Provider/Extender: [name], [name]: 58 Cellular or Tissue Based Product Type Wound #1 Right Breast (mastectomy site) Applied to: Performed By: Physician Worthy Keeler, PA Cellular or Tissue Based Product Type: Epicord Level of Consciousness (Pre-procedure): Awake and Alert Pre-procedure Verification/Time Out Yes - 10:50 Taken: Location: trunk / arms / legs Wound Size (sq cm): 6 Product Size (sq cm): 6 Waste Size (sq cm): 0 Amount of Product Applied (sq cm): 6 Instrument Used: Forceps, Scissors Lot #: [id number] Expiration Date: 05/13/2025 Fenestrated: Yes Instrument: Blade Secured: Yes Secured With: Steri-Strips Dressing Applied: Yes Primary Dressing: Sorbact, Adaptic Response to Treatment: Procedure was tolerated well Level of  Consciousness (Post- Awake and Alert procedure): Post Procedure Diagnosis Same as Pre-procedure Electronic Signature(s) Signed: 06/11/2022 5:14:36 PM By: Lorrin Jackson Signed: 06/11/2022 6:03:16 PM By: Worthy Keeler PA-C Entered By: Lorrin Jackson on 06/11/2022 10:59:19 -------------------------------------------------------------------------------- Debridement Details Patient Name: Date of Service: [name], [name] 06/11/2022 10:15 A M Medical Record Number: [id number] Patient Account Number: [id number] Date of Birth/Sex: Treating RN: Apr 17, 1951 (71 y.o. [name] Primary Care Provider: [name] Other Clinician: Referring Provider: Treating Provider/Extender: [name], [name]: 94 Debridement Performed for Assessment: Wound #1 Right Breast (mastectomy site) Performed By: Physician Worthy Keeler, PA Debridement Type: Debridement Level of Consciousness (Pre-procedure): Awake and Alert Pre-procedure Verification/Time Out Yes - 10:49 Taken: Start Time: 10:50 Pain Control: Lidocaine 4% T opical Solution T Area Debrided (L x W): otal 2 (cm) x 3 (cm) = 6 (cm) Tissue and other material debrided: Non-Viable, Slough, Subcutaneous, Slough Level: Skin/Subcutaneous Tissue Debridement Description: Excisional Instrument: Curette Bleeding: Minimum Hemostasis Achieved: Pressure End Time: 10:54 Response to Treatment: Procedure was tolerated well Level of Consciousness (Post- Awake and Alert procedure): Post Debridement Measurements of Total Wound Length: (cm) 2 Width: (cm) 3 Depth: (cm) 0.4 Volume: (cm) 1.885 Character of Wound/Ulcer Post Debridement: Stable Post Procedure Diagnosis Same as Pre-procedure Electronic Signature(s) Signed: 06/11/2022 5:14:36 PM By: Lorrin Jackson Signed: 06/11/2022 6:03:16 PM By: Worthy Keeler PA-C Entered By: Lorrin Jackson on 06/11/2022  10:55:29 -------------------------------------------------------------------------------- HPI Details Patient Name: Date of Service: [name], [name] 06/11/2022 10:15 A M Medical Record Number: [id number] Patient Account Number: [id number] Date of Birth/Sex: Treating RN: 09-26-1951 (71 y.o. [name] Primary Care Provider: [name] Other Clinician: Referring Provider: Treating Provider/Extender: [name], [name]: 88 History of Present Illness HPI Description: 08/22/2020 upon evaluation today patient actually appears to be doing somewhat poorly in regard to her mastectomy site on the right chest wall. She had a mastectomy initially in 1998. She had  chemotherapy only no radiation following. In 2002 she subsequently did undergo radiation for the first time and then subsequently in 2012 had repeat radiation after having had a finding that was consistent with a relapse of the cancer. 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 She also has radiation damage to the skin over the right chest wall where she had the mastectomy. She was referred to [place] by Dr. Matthew Saras in [facility] and her oncologist is Dr. Burney Gauze. Subsequently I want to make sure before we delve into this more deeply that the patient does not have any issues with a return of cancer that needs to be managed by them. 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 Nonetheless she does not remember exactly when her last PET scan was but it was not too recently she tells me. 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 The patient does have hypertension as well. She sees her oncologist next on December 6. 09/12/2020 on evaluation today patient actually appears to be doing  excellent in regard to her wound under the right breast location. Currently this is measuring smaller in general seems to be doing very well. She tells me that the collagen is doing a good job and that the dressing that she has been putting on is not causing any troubles as far pulling on her skin or scar tissue is concerned all of which is excellent news. 10/03/2020 upon evaluation today patient appears to be doing well with regard to her surgical site from her mastectomy on the right breast region. She tells me that she has had very little bleeding nothing seems to be sticking badly and in general she has been extremely pleased with where things stand today. No fevers, chills, nausea, vomiting, or diarrhea. 10/24/2020 upon evaluation today patient actually appears to be doing excellent in regard to her wound. There is just a very small area still open and to be honest there was minimal slough noted on the surface of the wound nothing that requires sharp debridement. In general I feel like she is actually doing quite well and overall I am pleased. I do think we may switch to silver alginate dressing and also contemplating using a AandE ointment in order to help keep everything moist and the scar tissue region while the alginate keeps it dry enough to heal 11/14/2020 upon evaluation today patient appears to be doing well at this point in regard to her wound. I do feel like that she is making good progress again this is a very difficult region over the surgical site of the right chest wall at the site of mastectomy. Nonetheless I think that we are just a very small area still open I think alginate is doing a good job helping to dry this up and I would recommend this such that we continue with this. 01/02/2021 on evaluation today patient's wound actually appears to be doing decently well. There does not appear to be any signs of active infection which is great news. She does have some slight slough buildup  with biofilm on the surface of the wound mainly more biofilm. Nonetheless I was able to gently remove this with saline and gauze as well as a sterile Q-tip. She tolerated that today without complication. 01/30/2021 upon evaluation today  patient appears to be doing well with regard to her wound although she still has quite a bit of trouble getting this to close I think the scar tissue and radiation damage is quite significant here unfortunately. There does not appear to be any signs of active infection which is great news. No fevers, chills, nausea, vomiting, or diarrhea. 02/27/2021 upon evaluation today patient appears to be doing excellent in regard to her wound. She has been tolerating the dressing changes without complication and in general I am extremely pleased with where things stand today. There does not appear to be any signs of infection which is great news. No fevers, chills, nausea, vomiting, or diarrhea. With that being said I do think that she still developing some slough buildup. I did discuss with her today the possibility of proceeding with HBO therapy. We have had this discussion before but she really is not quite committed to wanting to do that much and spend that much time in the chamber. She wants to still think about it. 03/27/2021 upon evaluation today patient appears to be doing about the same in regard to her wound. She still developing a lot of slough buildup on the surface of the wound. I did try to clean that away to some degree today. She tolerated that without any pain or complication. Subsequently I am thinking we may want to switch over to [facility] see if this may do better for her. Patient is in agreement with giving that a trial. 05/01/2021 upon evaluation today patient appears to be doing about the same in regard to her wounds. She has been tolerating the dressing changes without complication. Fortunately there does not appear to be any signs of active infection at this time  which is great news. No fevers, chills, nausea, vomiting, or diarrhea.. 06/05/2021 upon evaluation today patient appears to be doing pretty well currently in regard to her wound at the mastectomy site right chest wall. Overall since we switch back to the alginate she tells me things have significantly improved she is much happier with where things stand currently. Fortunately there does not appear to be any signs of active infection which is great news. No fevers, chills, nausea, vomiting, or diarrhea. 07/10/2021 upon evaluation today patient unfortunately does not appear to be doing nearly as well as what she previously was. There does not appear to be any evidence of new epithelial growth she has a lot of necrotic tissue the base of the wound this is much more significant than what we previously noted. She also has been having increased pain and increased drainage. In general I am very concerned about this especially in light of the fact that she does have a history of breast cancer I want to ensure that were not looking at any cancerous type lesion at this point. Otherwise also think she does need some debridement we did do MolecuLight scanning today. 07/17/2021 upon evaluation today patient appears to be doing well with regard to her wound compared to last week although she still having some erythema I am concerned in this regard. I do think that she fortunately had a negative biopsy which is great news unfortunately she did have Pseudomonas noted as a bacteria present in the culture which I think is good and need to be addressed with Levaquin. I Minna send that into the pharmacy today. We did repeat the MolecuLight screening. 07/31/2021 upon evaluation today patient's wound is actually showing signs of improvement which is great news. Fortunately there does  not appear to be any signs of infection currently locally nor systemically and I am very pleased in that regard. With that being said the  patient is having some issues here with continued necrotic tissue I think packing with the Dakin's moistened gauze dressing is still in the right leg ago. 08/14/2021 upon evaluation today patient appears to be doing well with regard to her wound all things considered I do not see any signs of significant infection which is great news. Fortunately there does not appear to be any evidence of infection which is also great news. In general I think that the patient is tolerating the dressing changes well. We been using a Dakin's moistened gauze. 08/28/2021 upon evaluation today patient appears to be doing well with regard to her wound. Worsening signs of definite improvement which is great news and overall very pleased with where we stand today. There is no signs of inflamed tissue or infection which is great as well and I think the Dakin's is doing a great job here. 09/11/2021 upon evaluation today patient appears to be doing well with regard to her wound all things considered. I am can perform some debridement today to clear away some of the necrotic debris with that being said overall I think that she is making decently good progress here which is great news. There does not appear to be any signs of active infection also good news. That in fact is probably the best news in her mind. 09/25/2021 upon evaluation today patient appears to be doing decently well in regard to her wound. Overall I do not see any signs of infection and I think she is doing quite well. I do believe that we are making headway here although is very slow due to the scarring and the depth of the wound this is a significantly deep wound. 10/16/2021 upon evaluation today patient appears to be doing about the same in regard to the wound on her right chest wall. Fortunately there does not appear to be any signs of active infection locally nor systemically which is great news. With that being said this still was not cleaning up quite as  quickly as I would like to see. Fortunately I think that we can definitely give the Santyl another try we tried this in the past and unfortunately did not have a good result but again I think she was infected at that time as well which was part of the issue. Nonetheless I think currently that we will be able to go ahead and see what we can do about getting this little cleaner with the Santyl. 11/06/2021 upon evaluation today patient appears to be doing well with regard to her wound. This actually appears to be a little bit better with the Santyl I am happy in that regard. Fortunately I do not see any signs of active infection at this time. No fevers, chills, nausea, vomiting, or diarrhea. 12/04/2021 upon evaluation patient appears to be doing well with the Santyl at this point. I am very pleased with where we stand and I think that she is making good progress here. Fortunately I do not see any evidence of active infection locally or systemically at this time which is great news. No fevers, chills, nausea, vomiting, or diarrhea. 01/01/2022 upon evaluation today patient actually is making excellent progress with the Santyl. I am extremely pleased with where we stand today. I do not see any signs of infection. 01-29-2022 upon evaluation today patient appears to be doing well  with regard to her wound. This is showing signs of some improvement. It is a little less deep than what it was. Size wise is also slightly smaller but again there still is a significant wound in this area. Fortunately I do not see any evidence of infection and she is doing quite well. 02-26-2022 upon evaluation today patient appears to be doing well with regard to her wound. In fact this is actually the best that I have seen in a very long time. I do not see any evidence of active infection at this time locally or systemically which is great news and overall I think we are on the right track. Nonetheless I do believe that the patient is  continuing to show evidence of improvement with the Santyl week by week. 03-26-2022 upon evaluation today patient appears to be doing well with regard to the wound on her right mastectomy site. She is actually showing signs of excellent improvement in fact there was some bleeding just with a little bit of light cleaning over the area. Fortunately I do not see any signs of active infection locally or systemically at this time which is great news. 04-30-2022 upon evaluation today patient appears to be doing well currently in regard to her wound. She is actually showing some signs of improvement here which is great news. Still this is very very slow. Subsequently I do believe that she may benefit from the use of Keystone topical antibiotics for short amount of time before applying a skin substitute I think EpiCord could be doing very well for her as far as that is concerned. I discussed that with her today. Fortunately I do not see any evidence of active infection locally or systemically at this time which is great news. No fevers, chills, nausea, vomiting, or diarrhea. 05-28-2022 upon evaluation today patient's wound is actually showing signs of significant improvement. Fortunately I do not see any evidence of infection at this time which is great news. No fevers, chills, nausea, vomiting, or diarrhea. With that being said I do believe that the patient is tolerating the dressing changes without complication. We been using the [facility] which I think is helpful. We do have approval for the Pollard. 06-11-2022 upon evaluation today patient appears to be doing well with regard to her wound she is actually here today for the first application of Bath which I think is good to be beneficial for her. I do think that we will probably be able to keep this in place without can be the one thing of question to be honest. Electronic Signature(s) Signed: 06/11/2022 11:25:55 AM By: Worthy Keeler PA-C Previous  Signature: 06/11/2022 11:25:38 AM Version By: Worthy Keeler PA-C Entered By: Worthy Keeler on 06/11/2022 11:25:55 -------------------------------------------------------------------------------- Physical Exam Details Patient Name: Date of Service: [name], [name] 06/11/2022 10:15 A M Medical Record Number: [id number] Patient Account Number: [id number] Date of Birth/Sex: Treating RN: 24-May-1951 (71 y.o. [name] Primary Care Provider: [name] Other Clinician: Referring Provider: Treating Provider/Extender: [name], [name]: 26 Constitutional [name] and well-hydrated in no acute distress. Respiratory normal breathing without difficulty. Psychiatric this patient is able to make decisions and demonstrates good insight into disease process. Alert and Oriented x 3. pleasant and cooperative. Notes Upon inspection patient's wound required minimal sharp debridement of clearway some of the necrotic debris and he tolerated this today without complication. Postdebridement the wound bed appears to be doing much better which is great news.  No fevers, chills, nausea, vomiting, or diarrhea. I then proceeded to go ahead and apply the [place] for the first application today. The patient tolerated this application without complication and overall I think we are on the right track here. Electronic Signature(s) Signed: 06/11/2022 12:22:22 PM By: Worthy Keeler PA-C Entered By: Worthy Keeler on 06/11/2022 12:22:22 -------------------------------------------------------------------------------- Physician Orders Details Patient Name: Date of Service: [name], [name] 06/11/2022 10:15 A M Medical Record Number: [id number] Patient Account Number: [id number] Date of Birth/Sex: Treating RN: [birth year] (71 y.o. [name] Primary Care Provider: [name] Other Clinician: Referring Provider: Treating Provider/Extender: [name], [name]: 42 Verbal / Phone Orders: No Diagnosis Coding ICD-10 Coding Code Description L98.492 Non-pressure chronic ulcer of skin of other sites with fat layer exposed L58.9 Radiodermatitis, unspecified L59.8 Other specified disorders of the skin and subcutaneous tissue related to radiation I10 Essential (primary) hypertension Follow-up Appointments ppointment in 1 week. - 06/18/22 @ 8:00am with Orland Jarred, RN (Room 7) Return A ppointment in 2 weeks. - 06/25/22 @ 9:30am Return A Anesthetic (In clinic) Topical Lidocaine 5% applied to wound bed (In clinic) Topical Lidocaine 4% applied to wound bed Cellular or Tissue Based Products Cellular or Tissue Based Product Type: - Order Epicord for application on 02/03/52=614% covered daptic or Mepitel. (DO NOT REMOVE). - Cellular or Tissue Based Product applied to wound bed, secured with steri-strips, cover with A Epicord #1 06/11/22 Bathing/ Shower/ Hygiene May shower and wash wound with soap and water. - on days that dressing is changed Additional Orders / Instructions Follow Nutritious Diet Wound Treatment Wound #1 - Breast (mastectomy site) Wound Laterality: Right Cleanser: Soap and Water 1 x Per Day/30 Days Discharge Instructions: May shower and wash wound with dial antibacterial soap and water prior to dressing change. Peri-Wound Care: Skin Prep (Generic) 1 x Per Day/30 Days Discharge Instructions: Use skin prep as directed Secondary Dressing: Woven Gauze Sponges 2x2 in (Generic) 1 x Per Day/30 Days Discharge Instructions: Apply over primary dressing, fold in corners to fill the space Secured With: 80M Medipore H Soft Cloth Surgical T ape, 4 x 10 (in/yd) 1 x Per Day/30 Days Discharge Instructions: Secure with tape as directed. Electronic Signature(s) Signed: 06/11/2022 5:14:36 PM By: Lorrin Jackson Signed: 06/11/2022 6:03:16 PM By: Worthy Keeler PA-C Entered By: Lorrin Jackson on 06/11/2022  11:03:26 -------------------------------------------------------------------------------- Problem List Details Patient Name: Date of Service: [name], [name] 06/11/2022 10:15 A M Medical Record Number: [id number] Patient Account Number: [id number] Date of Birth/Sex: Treating RN: 19-Jan-1951 (71 y.o. [name] Primary Care Provider: [name] Other Clinician: Referring Provider: Treating Provider/Extender: [name], [name]: 94 Active Problems ICD-10 Encounter Code Description Active Date MDM Diagnosis L98.492 Non-pressure chronic ulcer of skin of other sites with fat layer exposed 08/22/2020 No Yes L58.9 Radiodermatitis, unspecified 08/22/2020 No Yes L59.8 Other specified disorders of the skin and subcutaneous tissue related to 08/22/2020 No Yes radiation I10 Essential (primary) hypertension 08/22/2020 No Yes Inactive Problems Resolved Problems Electronic Signature(s) Signed: 06/11/2022 10:04:27 AM By: Worthy Keeler PA-C Entered By: Worthy Keeler on 06/11/2022 10:04:27 -------------------------------------------------------------------------------- Progress Note Details Patient Name: Date of Service: [name], [name] 06/11/2022 10:15 A M Medical Record Number: [id number] Patient Account Number: [id number] Date of Birth/Sex: Treating RN: May 17, 1951 (71 y.o. 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 She had a mastectomy initially in 1998. She had chemotherapy only no radiation following. In 2002 she  subsequently did undergo radiation for the first time and then subsequently in 2012 had repeat radiation after having had a finding that was consistent with a relapse of the cancer. 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 She also has radiation damage to the skin over the right chest wall where she had the mastectomy. She was referred to [place] by Dr. Matthew Saras in [facility] and her oncologist is Dr. Burney Gauze. Subsequently I want to make sure before we delve into this more deeply that the patient does not have any issues with a return of cancer that needs to be managed by them. 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 Nonetheless she does not remember exactly when her last PET scan was but it was not too recently she tells me. 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 The patient does have hypertension as well. She sees her oncologist next on December 6. 09/12/2020 on evaluation today patient actually appears to be doing excellent in regard to her wound under the right breast location. Currently this is measuring smaller in general seems to be doing very well. She tells me that the collagen is doing a good job and that the dressing that she has been putting on is not causing any troubles as far pulling on her skin or scar tissue is concerned all of which is excellent news. 10/03/2020 upon evaluation today patient appears to be doing well with regard to her surgical site from her mastectomy on the right breast region. She tells me that she has had very little bleeding nothing seems to be sticking badly and in general she has been extremely pleased with where things stand today. No fevers, chills, nausea, vomiting, or diarrhea. 10/24/2020 upon evaluation today patient actually appears  to be doing excellent in regard to her wound. There is just a very small area still open and to be honest there was minimal slough noted on the surface of the wound nothing that requires sharp debridement. In general I feel like she is actually doing quite well and overall I am pleased. I do think we may switch to silver alginate dressing and also contemplating using a AandE ointment in order to help keep everything moist and the scar tissue region while the alginate keeps it dry enough to heal 11/14/2020 upon evaluation today patient appears to be doing well at this point in regard to her wound. I do feel like that she is making good progress again this is a very difficult region over the surgical site of the right chest wall at the site of mastectomy. Nonetheless I think that we are just a very small area still open I think alginate is doing a good job helping to dry this up and I would recommend this such that we continue with this. 01/02/2021 on evaluation today patient's wound actually appears to be doing decently well. There does not appear to be any signs of active  infection which is great news. She does have some slight slough buildup with biofilm on the surface of the wound mainly more biofilm. Nonetheless I was able to gently remove this with saline and gauze as well as a sterile Q-tip. She tolerated that today without complication. 01/30/2021 upon evaluation today patient appears to be doing well with regard to her wound although she still has quite a bit of trouble getting this to close I think the scar tissue and radiation damage is quite significant here unfortunately. There does not appear to be any signs of active infection which is great news. No fevers, chills, nausea, vomiting, or diarrhea. 02/27/2021 upon evaluation today patient appears to be doing excellent in regard to her wound. She has been tolerating the dressing changes without complication and in general I am extremely pleased  with where things stand today. There does not appear to be any signs of infection which is great news. No fevers, chills, nausea, vomiting, or diarrhea. With that being said I do think that she still developing some slough buildup. I did discuss with her today the possibility of proceeding with HBO therapy. We have had this discussion before but she really is not quite committed to wanting to do that much and spend that much time in the chamber. She wants to still think about it. 03/27/2021 upon evaluation today patient appears to be doing about the same in regard to her wound. She still developing a lot of slough buildup on the surface of the wound. I did try to clean that away to some degree today. She tolerated that without any pain or complication. Subsequently I am thinking we may want to switch over to [facility] see if this may do better for her. Patient is in agreement with giving that a trial. 05/01/2021 upon evaluation today patient appears to be doing about the same in regard to her wounds. She has been tolerating the dressing changes without complication. Fortunately there does not appear to be any signs of active infection at this time which is great news. No fevers, chills, nausea, vomiting, or diarrhea.. 06/05/2021 upon evaluation today patient appears to be doing pretty well currently in regard to her wound at the mastectomy site right chest wall. Overall since we switch back to the alginate she tells me things have significantly improved she is much happier with where things stand currently. Fortunately there does not appear to be any signs of active infection which is great news. No fevers, chills, nausea, vomiting, or diarrhea. 07/10/2021 upon evaluation today patient unfortunately does not appear to be doing nearly as well as what she previously was. There does not appear to be any evidence of new epithelial growth she has a lot of necrotic tissue the base of the wound this is much more  significant than what we previously noted. She also has been having increased pain and increased drainage. In general I am very concerned about this especially in light of the fact that she does have a history of breast cancer I want to ensure that were not looking at any cancerous type lesion at this point. Otherwise also think she does need some debridement we did do MolecuLight scanning today. 07/17/2021 upon evaluation today patient appears to be doing well with regard to her wound compared to last week although she still having some erythema I am concerned in this regard. I do think that she fortunately had a negative biopsy which is great news unfortunately she did have Pseudomonas noted  as a bacteria present in the culture which I think is good and need to be addressed with Levaquin. I Minna send that into the pharmacy today. We did repeat the MolecuLight screening. 07/31/2021 upon evaluation today patient's wound is actually showing signs of improvement which is great news. Fortunately there does not appear to be any signs of infection currently locally nor systemically and I am very pleased in that regard. With that being said the patient is having some issues here with continued necrotic tissue I think packing with the Dakin's moistened gauze dressing is still in the right leg ago. 08/14/2021 upon evaluation today patient appears to be doing well with regard to her wound all things considered I do not see any signs of significant infection which is great news. Fortunately there does not appear to be any evidence of infection which is also great news. In general I think that the patient is tolerating the dressing changes well. We been using a Dakin's moistened gauze. 08/28/2021 upon evaluation today patient appears to be doing well with regard to her wound. Worsening signs of definite improvement which is great news and overall very pleased with where we stand today. There is no signs of  inflamed tissue or infection which is great as well and I think the Dakin's is doing a great job here. 09/11/2021 upon evaluation today patient appears to be doing well with regard to her wound all things considered. I am can perform some debridement today to clear away some of the necrotic debris with that being said overall I think that she is making decently good progress here which is great news. There does not appear to be any signs of active infection also good news. That in fact is probably the best news in her mind. 09/25/2021 upon evaluation today patient appears to be doing decently well in regard to her wound. Overall I do not see any signs of infection and I think she is doing quite well. I do believe that we are making headway here although is very slow due to the scarring and the depth of the wound this is a significantly deep wound. 10/16/2021 upon evaluation today patient appears to be doing about the same in regard to the wound on her right chest wall. Fortunately there does not appear to be any signs of active infection locally nor systemically which is great news. With that being said this still was not cleaning up quite as quickly as I would like to see. Fortunately I think that we can definitely give the Santyl another try we tried this in the past and unfortunately did not have a good result but again I think she was infected at that time as well which was part of the issue. Nonetheless I think currently that we will be able to go ahead and see what we can do about getting this little cleaner with the Santyl. 11/06/2021 upon evaluation today patient appears to be doing well with regard to her wound. This actually appears to be a little bit better with the Santyl I am happy in that regard. Fortunately I do not see any signs of active infection at this time. No fevers, chills, nausea, vomiting, or diarrhea. 12/04/2021 upon evaluation patient appears to be doing well with the Santyl at  this point. I am very pleased with where we stand and I think that she is making good progress here. Fortunately I do not see any evidence of active infection locally or systemically at this  time which is great news. No fevers, chills, nausea, vomiting, or diarrhea. 01/01/2022 upon evaluation today patient actually is making excellent progress with the Santyl. I am extremely pleased with where we stand today. I do not see any signs of infection. 01-29-2022 upon evaluation today patient appears to be doing well with regard to her wound. This is showing signs of some improvement. It is a little less deep than what it was. Size wise is also slightly smaller but again there still is a significant wound in this area. Fortunately I do not see any evidence of infection and she is doing quite well. 02-26-2022 upon evaluation today patient appears to be doing well with regard to her wound. In fact this is actually the best that I have seen in a very long time. I do not see any evidence of active infection at this time locally or systemically which is great news and overall I think we are on the right track. Nonetheless I do believe that the patient is continuing to show evidence of improvement with the Santyl week by week. 03-26-2022 upon evaluation today patient appears to be doing well with regard to the wound on her right mastectomy site. She is actually showing signs of excellent improvement in fact there was some bleeding just with a little bit of light cleaning over the area. Fortunately I do not see any signs of active infection locally or systemically at this time which is great news. 04-30-2022 upon evaluation today patient appears to be doing well currently in regard to her wound. She is actually showing some signs of improvement here which is great news. Still this is very very slow. Subsequently I do believe that she may benefit from the use of [place] topical antibiotics for short amount of time  before applying a skin substitute I think EpiCord could be doing very well for her as far as that is concerned. I discussed that with her today. Fortunately I do not see any evidence of active infection locally or systemically at this time which is great news. No fevers, chills, nausea, vomiting, or diarrhea. 05-28-2022 upon evaluation today patient's wound is actually showing signs of significant improvement. Fortunately I do not see any evidence of infection at this time which is great news. No fevers, chills, nausea, vomiting, or diarrhea. With that being said I do believe that the patient is tolerating the dressing changes without complication. We been using the [facility] which I think is helpful. We do have approval for the [place]. 06-11-2022 upon evaluation today patient appears to be doing well with regard to her wound she is actually here today for the first application of [place] which I think is good to be beneficial for her. I do think that we will probably be able to keep this in place without can be the one thing of question to be honest. Objective Constitutional [name] and well-hydrated in no acute distress. Vitals Time Taken: 10:23 AM, Height: 63 in, Weight: 176 lbs, BMI: 31.2, Temperature: 97.9 F, Pulse: 75 bpm, Respiratory Rate: 18 breaths/min, Blood Pressure: 158/79 mmHg. Respiratory normal breathing without difficulty. Psychiatric this patient is able to make decisions and demonstrates good insight into disease process. Alert and Oriented x 3. pleasant and cooperative. General Notes: Upon inspection patient's wound required minimal sharp debridement of clearway some of the necrotic debris and he tolerated this today without complication. Postdebridement the wound bed appears to be doing much better which is great news. No fevers, chills, nausea, vomiting, or  diarrhea. I then proceeded to go ahead and apply the [place] for the first application today. The patient  tolerated this application without complication and overall I think we are on the right track here. Integumentary (Hair, Skin) Wound #1 status is Open. Original cause of wound was Radiation Burn. The date acquired was: 07/26/2020. The wound has been in treatment 94 weeks. The wound is located on the Right Breast (mastectomy site). The wound measures 2cm length x 3cm width x 0.4cm depth; 4.712cm^2 area and 1.885cm^3 volume. There is Fat Layer (Subcutaneous Tissue) exposed. There is no tunneling or undermining noted. There is a medium amount of serosanguineous drainage noted. The wound margin is distinct with the outline attached to the wound base. There is medium (34-66%) pink granulation within the wound bed. There is a medium (34-66%) amount of necrotic tissue within the wound bed including Adherent Slough. 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 There was a Excisional Skin/Subcutaneous Tissue Debridement with a total area of 6 sq cm performed by Worthy Keeler, PA. With the following instrument(s): Curette to remove Non-Viable tissue/material. Material removed includes Subcutaneous Tissue and Slough and after achieving pain control using Lidocaine 4% Topical Solution. No specimens were taken. A time out was conducted at 10:49, prior to the start of the procedure. A Minimum amount of bleeding was controlled with Pressure. The procedure was tolerated well. Post Debridement Measurements: 2cm length x 3cm width x 0.4cm depth; 1.885cm^3 volume. Character of Wound/Ulcer Post Debridement is stable. 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 A skin graft procedure using a bioengineered skin substitute/cellular or tissue based product was performed by Worthy Keeler, PA with the following instrument(s): Forceps and Scissors. Epicord was applied and secured with Steri-Strips. 6 sq cm of product was utilized and 0 sq cm was wasted. Post Application, Sorbact, Adaptic was applied. A Time Out was conducted at 10:50, prior to the start of the procedure. The procedure was tolerated well. Post procedure Diagnosis Wound #1: Same as Pre-Procedure . Plan Follow-up Appointments: Return Appointment in 1 week. - 06/18/22 @ 8:00am with Orland Jarred, RN (Room 7) Return Appointment in 2 weeks. - 06/25/22 @ 9:30am Anesthetic: (In clinic) Topical Lidocaine 5% applied to wound bed (In clinic) Topical Lidocaine 4% applied to wound bed Cellular or Tissue Based Products: Cellular or Tissue Based Product Type: - Order Epicord for application on 5/62/13=086% covered Cellular or Tissue Based Product applied to wound bed, secured with steri-strips, cover with Adaptic or Mepitel. (DO NOT REMOVE). - Epicord #1 06/11/22 Bathing/ Shower/ Hygiene: May shower and wash wound with soap and water. - on days that dressing is changed Additional Orders / Instructions: Follow Nutritious Diet WOUND #1: - Breast (mastectomy site) Wound Laterality: Right Cleanser: Soap and Water 1 x Per Day/30 Days Discharge Instructions: May shower and wash wound with dial antibacterial soap and water prior to dressing change. Peri-Wound Care: Skin Prep (Generic) 1 x Per Day/30 Days Discharge Instructions: Use skin prep as directed Secondary Dressing: Woven Gauze Sponges 2x2 in (Generic) 1 x Per Day/30 Days Discharge Instructions: Apply over primary dressing, fold in  corners to fill the space Secured With: 2M Medipore H Soft Cloth Surgical T ape, 4 x 10 (in/yd)  1 x Per Day/30 Days Discharge Instructions: Secure with tape as directed. 1. I am good recommend that patient keep this in place over the next week working to see how things do at the next week's evaluation. 2. I am good recommend as well that the patient should continue to monitor for any signs of worsening or infection. Obviously if anything changes she should contact the office and let me know. Otherwise at the moment I do believe that the primary thing is to try to keep the dressing in place with the [place] but this is the first application today. We will see patient back for reevaluation in 1 week here in the clinic. If anything worsens or changes patient will contact our office for additional recommendations. Electronic Signature(s) Signed: 06/11/2022 12:22:55 PM By: Worthy Keeler PA-C Entered By: Worthy Keeler on 06/11/2022 12:22:55 -------------------------------------------------------------------------------- SuperBill Details Patient Name: Date of Service: [name], [place] YE 06/11/2022 Medical Record Number: [id number] Patient Account Number: [id number] Date of Birth/Sex: Treating RN: 09-09-1951 (71 y.o. [name] Primary Care Provider: [name] Other Clinician: Referring Provider: Treating Provider/Extender: [name], [name]: 94 Diagnosis Coding ICD-10 Codes Code Description [phone] Non-pressure chronic ulcer of skin of other sites with fat layer exposed L58.9 Radiodermatitis, unspecified L59.8 Other specified disorders of the skin and subcutaneous tissue related to radiation I10 Essential (primary) hypertension Facility Procedures The patient participates with Medicare or their insurance follows the Medicare Facility Guidelines: CPT4 Code Description Modifier Quantity [phone] Epicord 2cm x 3cm - per sqcm 6 ICD-10 Diagnosis Description L98.492 Non-pressure chronic ulcer of  skin of other sites with fat layer  exposed The patient participates with Medicare or their insurance follows the Medicare Facility Guidelines: [id number] - SKIN SUB GRAFT TRNK/ARM/LEG 1 ICD-10 Diagnosis Description L98.492 Non-pressure chronic ulcer of skin of other sites with fat layer  exposed Physician Procedures : CPT4 Code Description Modifier [id number] - WC PHYS SKIN SUB GRAFT TRNK/ARM/LEG ICD-10 Diagnosis Description L98.492 Non-pressure chronic ulcer of skin of other sites with fat layer exposed Quantity: 1 Electronic Signature(s) Signed: 06/11/2022 12:23:03 PM By: Worthy Keeler PA-C Entered By: Worthy Keeler on 06/11/2022 12:23:02

## 2022-06-11 NOTE — Progress Notes (Signed)
[name], BAYNES ([id number]) Visit Report for 06/11/2022 Arrival Information Details Patient Name: Date of Service: [name] [name] 06/11/2022 10:15 A M Medical Record Number: [id number] Patient Account Number: [id number] Date of Birth/Sex: Treating RN: 07-05-1951 (71 y.o. [name] Primary Care Peityn Payton: [name] Other Clinician: Referring Tannia Contino: Treating Hart Haas/Extender: [name], [name]: 70 Visit Information History Since Last Visit Added or deleted any medications: No Patient Arrived: Ambulatory Any new allergies or adverse reactions: No Arrival Time: 10:22 Had a fall or experienced change in No Accompanied By: self activities of daily living that may affect Transfer Assistance: None risk of falls: Patient Identification Verified: Yes Signs or symptoms of abuse/neglect since last visito No Secondary Verification Process Completed: Yes Hospitalized since last visit: No Patient Requires Transmission-Based Precautions: No Implantable device outside of the clinic excluding No Patient Has Alerts: No cellular tissue based products placed in the center since last visit: Has Dressing in Place as Prescribed: Yes Pain Present Now: No Electronic Signature(s) Signed: 06/11/2022 4:51:17 PM By: Erenest Blank Entered By: Erenest Blank on 06/11/2022 10:22:48 -------------------------------------------------------------------------------- Encounter Discharge Information Details Patient Name: Date of Service: [name], [name] 06/11/2022 10:15 A M Medical Record Number: [id number] Patient Account Number: [id number] Date of Birth/Sex: Treating RN: August 18, 1951 (71 y.o. [name] Primary Care Corina Stacy: [name] Other Clinician: Referring Hartley Wyke: Treating Kainan Patty/Extender: [name], [name]: 47 Encounter Discharge Information Items Post Procedure Vitals Discharge Condition:  Stable Temperature (F): 97.9 Ambulatory Status: Ambulatory Pulse (bpm): 75 Discharge Destination: Home Respiratory Rate (breaths/min): 18 Transportation: Private Auto Blood Pressure (mmHg): 158/79 Schedule Follow-up Appointment: Yes Clinical Summary of Care: Provided on 06/11/2022 Form Type Recipient Paper Patient Patient Electronic Signature(s) Signed: 06/11/2022 5:14:36 PM By: Lorrin Jackson Entered By: Lorrin Jackson on 06/11/2022 11:04:56 -------------------------------------------------------------------------------- Lower Extremity Assessment Details Patient Name: Date of Service: [name], [name] 06/11/2022 10:15 A M Medical Record Number: [id number] Patient Account Number: [id number] Date of Birth/Sex: Treating RN: 03/13/1951 (71 y.o. [name] Primary Care Divante Kotch: [name] Other Clinician: Referring Envy Meno: Treating Dunia Pringle/Extender: [name], [name]: 28 Electronic Signature(s) Signed: 06/11/2022 4:51:17 PM By: Erenest Blank Signed: 06/11/2022 5:14:36 PM By: Fara Chute By: Erenest Blank on 06/11/2022 10:24:28 -------------------------------------------------------------------------------- Multi-Disciplinary Care Plan Details Patient Name: Date of Service: [name], [name] 06/11/2022 10:15 A M Medical Record Number: [id number] Patient Account Number: [id number] Date of Birth/Sex: Treating RN: 02/01/1951 (71 y.o. [name] Primary Care Jaycie Kregel: [name] Other Clinician: Referring Taj Arteaga: Treating Danniel Grenz/Extender: [name], [name] in Treatment: 64 [place] reviewed with physician Active Inactive Necrotic Tissue Nursing Diagnoses: Knowledge deficit related to management of necrotic/devitalized tissue Goals: Necrotic/devitalized tissue will be minimized in the wound bed Date Initiated: 07/10/2021 Target Resolution Date: 07/09/2022 Goal  Status: Active Interventions: Provide education on necrotic tissue and debridement process Treatment Activities: Excisional debridement : 07/10/2021 Notes: 01/01/22: Continues to use Santyl 06/11/22: Wound cleaner Wound/Skin Impairment Nursing Diagnoses: Impaired tissue integrity Knowledge deficit related to ulceration/compromised skin integrity Goals: Patient/caregiver will verbalize understanding of skin care regimen Date Initiated: 08/22/2020 Target Resolution Date: 07/09/2022 Goal Status: Active Ulcer/skin breakdown will have a volume reduction of 30% by week 4 Date Initiated: 08/22/2020 Date Inactivated: 10/03/2020 Target Resolution Date: 09/19/2020 Goal Status: Met Ulcer/skin breakdown will have a volume reduction of 50% by week 8 Date Initiated: 06/05/2021 Date Inactivated: 07/17/2021 Target Resolution Date: 07/03/2021 Goal Status: Unmet Unmet Reason:  infection Interventions: Assess patient/caregiver ability to obtain necessary supplies Assess patient/caregiver ability to perform ulcer/skin care regimen upon admission and as needed Assess ulceration(s) every visit Treatment Activities: Skin care regimen initiated : 08/22/2020 Topical wound management initiated : 08/22/2020 Notes: 06/05/21: Wound care regimen ongoing. 06/11/22: Wound care regimen continues, applying skin sub ([place]) Electronic Signature(s) Signed: 06/11/2022 10:31:25 AM By: Lorrin Jackson Entered By: Lorrin Jackson on 06/11/2022 10:31:25 -------------------------------------------------------------------------------- Pain Assessment Details Patient Name: Date of Service: [name], [name] 06/11/2022 10:15 A M Medical Record Number: [id number] Patient Account Number: [id number] Date of Birth/Sex: Treating RN: Mar 20, 1951 (71 y.o. [name] Primary Care Tanicia Wolaver: [name] Other Clinician: Referring Aalaya Yadao: Treating Isela Stantz/Extender: [name], [name]:  35 Active Problems Location of Pain Severity and Description of Pain Patient Has Paino No Site Locations Pain Management and Medication Current Pain Management: Electronic Signature(s) Signed: 06/11/2022 4:51:17 PM By: Erenest Blank Signed: 06/11/2022 5:14:36 PM By: Lorrin Jackson Entered By: Erenest Blank on 06/11/2022 10:24:21 -------------------------------------------------------------------------------- Patient/Caregiver Education Details Patient Name: Date of Service: [name], [name] 8/30/2023andnbsp10:15 A M Medical Record Number: [id number] Patient Account Number: [id number] Date of Birth/Gender: Treating RN: [birth year] (71 y.o. [name] Primary Care Physician: [name] Other Clinician: Referring Physician: Treating Physician/Extender: [name], [name] in Treatment: 74 Education Assessment Education Provided To: Patient Education Topics Provided Wound/Skin Impairment: Methods: Demonstration, Explain/Verbal, Printed Responses: State content correctly [employer]) Signed: 06/11/2022 5:14:36 PM By: Lorrin Jackson Entered By: Lorrin Jackson on 06/11/2022 10:31:45 -------------------------------------------------------------------------------- Wound Assessment Details Patient Name: Date of Service: [name], [name] 06/11/2022 10:15 A M Medical Record Number: [id number] Patient Account Number: [id number] Date of Birth/Sex: Treating RN: 1951-01-13 (71 y.o. [name] Primary Care Kailan Laws: [name] Other Clinician: Referring Aden Youngman: Treating Lean Fayson/Extender: [name], [name]: 94 Wound Status Wound Number: 1 Primary Soft Tissue Radionecrosis Etiology: Wound Location: Right Breast (mastectomy site) Wound Open Wounding Event: Radiation Burn Status: Date Acquired: 07/26/2020 Comorbid Anemia, Lymphedema, Deep Vein Thrombosis, Hypertension, Weeks Of Treatment:  94 History: Peripheral Venous Disease, Osteoarthritis, Received Clustered Wound: No Chemotherapy, Received Radiation, Confinement Anxiety Photos Wound Measurements Length: (cm) 2 Width: (cm) 3 Depth: (cm) 0.4 Area: (cm) 4.712 Volume: (cm) 1.885 % Reduction in Area: -207.6% % Reduction in Volume: -1132% Epithelialization: None Tunneling: No Undermining: No Wound Description Classification: Full Thickness Without Exposed Support Structures Wound Margin: Distinct, outline attached Exudate Amount: Medium Exudate Type: Serosanguineous Exudate Color: red, brown Foul Odor After Cleansing: No Slough/Fibrino Yes Wound Bed Granulation Amount: Medium (34-66%) Exposed Structure Granulation Quality: Pink Fascia Exposed: No Necrotic Amount: Medium (34-66%) Fat Layer (Subcutaneous Tissue) Exposed: Yes Necrotic Quality: Adherent Slough Tendon Exposed: No Muscle Exposed: No Joint Exposed: No Bone Exposed: No Treatment Notes Wound #1 (Breast (mastectomy site)) Wound Laterality: Right Cleanser Soap and Water Discharge Instruction: May shower and wash wound with dial antibacterial soap and water prior to dressing change. Peri-Wound Care Skin Prep Discharge Instruction: Use skin prep as directed Topical Primary Dressing Secondary Dressing Woven Gauze Sponges 2x2 in Discharge Instruction: Apply over primary dressing, fold in corners to fill the space Secured With 32M Medipore H Soft Cloth Surgical T ape, 4 x 10 (in/yd) Discharge Instruction: Secure with tape as directed. Compression Wrap Compression Stockings Add-Ons Electronic Signature(s) Signed: 06/11/2022 4:51:17 PM By: Erenest Blank Signed: 06/11/2022 5:14:36 PM By: Lorrin Jackson Entered By: Erenest Blank on 06/11/2022 10:30:31 -------------------------------------------------------------------------------- Vitals Details Patient Name: Date of Service: [name]  [name], [name] 06/11/2022 10:15 A M Medical Record Number:  [id number] Patient Account Number: [id number] Date of Birth/Sex: Treating RN: Apr 29, 1951 (71 y.o. [name] Primary Care Kadisha Goodine: [name] Other Clinician: Referring Shameca Landen: Treating Wenona [place]/Extender: [name], [name]: 30 Vital Signs Time Taken: 10:23 Temperature (F): 97.9 Height (in): 63 Pulse (bpm): 75 Weight (lbs): 176 Respiratory Rate (breaths/min): 18 Body Mass Index (BMI): 31.2 Blood Pressure (mmHg): 158/79 Reference Range: 80 - 120 mg / dl Electronic Signature(s) Signed: 06/11/2022 4:51:17 PM By: Erenest Blank Entered By: Erenest Blank on 06/11/2022 10:24:16

## 2022-06-12 DIAGNOSIS — I10 Essential (primary) hypertension: Secondary | ICD-10-CM | POA: Diagnosis not present

## 2022-06-12 DIAGNOSIS — E669 Obesity, unspecified: Secondary | ICD-10-CM | POA: Diagnosis not present

## 2022-06-13 DIAGNOSIS — I700 Atherosclerosis of aorta: Secondary | ICD-10-CM | POA: Diagnosis not present

## 2022-06-13 DIAGNOSIS — I10 Essential (primary) hypertension: Secondary | ICD-10-CM | POA: Diagnosis not present

## 2022-06-18 ENCOUNTER — Encounter (HOSPITAL_BASED_OUTPATIENT_CLINIC_OR_DEPARTMENT_OTHER): Payer: Medicare Other | Attending: Physician Assistant | Admitting: Physician Assistant

## 2022-06-18 DIAGNOSIS — Z853 Personal history of malignant neoplasm of breast: Secondary | ICD-10-CM | POA: Diagnosis not present

## 2022-06-18 DIAGNOSIS — Z9011 Acquired absence of right breast and nipple: Secondary | ICD-10-CM | POA: Insufficient documentation

## 2022-06-18 DIAGNOSIS — L589 Radiodermatitis, unspecified: Secondary | ICD-10-CM | POA: Insufficient documentation

## 2022-06-18 DIAGNOSIS — I10 Essential (primary) hypertension: Secondary | ICD-10-CM | POA: Diagnosis not present

## 2022-06-18 DIAGNOSIS — Z9221 Personal history of antineoplastic chemotherapy: Secondary | ICD-10-CM | POA: Insufficient documentation

## 2022-06-18 DIAGNOSIS — Z86718 Personal history of other venous thrombosis and embolism: Secondary | ICD-10-CM | POA: Diagnosis not present

## 2022-06-18 DIAGNOSIS — S21001A Unspecified open wound of right breast, initial encounter: Secondary | ICD-10-CM | POA: Diagnosis not present

## 2022-06-18 DIAGNOSIS — I890 Lymphedema, not elsewhere classified: Secondary | ICD-10-CM | POA: Insufficient documentation

## 2022-06-18 DIAGNOSIS — L98492 Non-pressure chronic ulcer of skin of other sites with fat layer exposed: Secondary | ICD-10-CM | POA: Insufficient documentation

## 2022-06-18 DIAGNOSIS — L598 Other specified disorders of the skin and subcutaneous tissue related to radiation: Secondary | ICD-10-CM | POA: Insufficient documentation

## 2022-06-18 NOTE — Progress Notes (Addendum)
CURLEY, [name] ([id number]) Visit Report for 06/18/2022 Chief Complaint Document Details Patient Name: Date of Service: [name], [name] 06/18/2022 8:00 A M Medical Record Number: [id number] Patient Account Number: [id number] Date of Birth/Sex: Treating RN: June 14, 1951 (71 y.o. 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 [name] Primary Care Provider: [name] Other Clinician: Referring Provider: Treating Provider/Extender: [name], [name]: [phone] Cellular or Tissue Based Product Type Wound #1 Right Breast (mastectomy site) Applied to: Performed By: Physician Worthy Keeler, PA Cellular or Tissue Based Product Type: Epicord Level of Consciousness (Pre-procedure): Awake and Alert Pre-procedure Verification/Time Out Yes - 08:35 Taken: Location: trunk / arms / legs Wound Size (sq cm): 5.4 Product Size (sq cm): 6 Waste Size (sq cm): 0 Amount of Product Applied (sq cm): 6 Instrument Used: Forceps, Scissors Lot #: [id number] Expiration Date: 02/11/2027 Reconstituted: Yes Solution Type: Normal Saline Solution Amount: 41m Lot #: [id number] Expiration Date: 07/13/2022 Secured: Yes Secured With: Steri-Strips Dressing Applied: Yes Primary Dressing: Sorbact, Gauze Response  to Treatment: Procedure was tolerated well Level of Consciousness (Post- Awake and Alert procedure): Post Procedure Diagnosis Same as Pre-procedure Electronic Signature(s) Signed: 06/18/2022 4:41:40 PM By: BLorrin JacksonSigned: 06/18/2022 7:10:08 PM By: SWorthy KeelerPA-C Entered By: BLorrin Jacksonon 06/18/2022 08:41:13 -------------------------------------------------------------------------------- HPI Details Patient Name: Date of Service: [name], [name] 06/18/2022 8:00 A M Medical Record Number: [id number] Account Number: [id number] of Birth/Sex: Treating RN: 2January 15, 1952((71y.o. F) Primary Care Provider: HMaryella ShiversOther Clinician: Referring Provider: Treating Provider/Extender: SZenon Mayo [name]: 95 History of Present Illness HPI Description: 08/22/2020 upon evaluation today patient actually appears to be doing somewhat poorly in regard to her mastectomy site on the right chest wall. She had a mastectomy initially in 1998. She had chemotherapy only no radiation following. In 2002 she subsequently did undergo radiation for the first time and then subsequently in 2012 had repeat radiation after having had a finding that was consistent with a relapse of the cancer. 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 She also has radiation damage to the skin over the right chest wall where she had the mastectomy. [facility] [place] [facility] [place] [facility]and her oncologist is Dr. PBurney Gauze Subsequently I want to make sure before we delve into this more deeply that the patient does not have any issues with a return of cancer that needs to be managed by them. 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 Nonetheless she does not remember  exactly when her last PET scan was but it was not too recently she tells me. 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 The patient does have hypertension as well. She sees her oncologist next on December  6. 09/12/2020 on evaluation today patient actually appears to be doing excellent in regard to her wound under the right breast location. Currently this is measuring smaller in general seems to be doing very well. She tells me that the collagen is doing a good job and that the dressing that she has been putting on is not causing any troubles as far pulling on her skin or scar tissue is concerned all of which is excellent news. 10/03/2020 upon evaluation today patient appears to be doing well with regard to her surgical site from her mastectomy on the right breast region. She tells me that she has had very little bleeding nothing seems to be sticking badly and in general she has been extremely pleased with where things stand today. No fevers, chills, nausea, vomiting, or diarrhea. 10/24/2020 upon evaluation today patient actually appears to be doing excellent in regard to her wound. There is just a very small area still open and to be honest there was minimal slough noted on the surface of the wound nothing that requires sharp debridement. In general I feel like she is actually doing quite well and overall I am pleased. I do think we may switch to silver alginate dressing and also contemplating using a AandE ointment in order to help keep everything moist and the scar tissue region while the alginate keeps it dry enough to heal 11/14/2020 upon evaluation today patient appears to be doing well at this point in regard to her wound. I do feel like that she is making good progress again this is a very difficult region over the surgical site of the right chest wall at the site of mastectomy. Nonetheless I think that we are just a very small area still open I think alginate is doing a good job  helping to dry this up and I would recommend this such that we continue with this. 01/02/2021 on evaluation today patient's wound actually appears to be doing decently well. There does not appear to be any signs of active infection which is great news. She does have some slight slough buildup with biofilm on the surface of the wound mainly more biofilm. Nonetheless I was able to gently remove this with saline and gauze as well as a sterile Q-tip. She tolerated that today without complication. 01/30/2021 upon evaluation today patient appears to be doing well with regard to her wound although she still has quite a bit of trouble getting this to close I think the scar tissue and radiation damage is quite significant here unfortunately. There does not appear to be any signs of active infection which is great news. No fevers, chills, nausea, vomiting, or diarrhea. 02/27/2021 upon evaluation today patient appears to be doing excellent in regard to her wound. She has been tolerating the dressing changes without complication and in general I am extremely pleased with where things stand today. There does not appear to be any signs of infection which is great news. No fevers, chills, nausea, vomiting, or diarrhea. With that being said I do think that she still developing some slough buildup. I did discuss with her today the possibility of proceeding with HBO therapy. We have had this discussion before but she really is not quite committed to wanting to do that much and spend that much time in the chamber. She wants to still think about it. 03/27/2021 upon evaluation today patient appears to be doing about the same in regard to her wound. She still developing a lot of slough buildup  on the surface of the wound. I did try to clean that away to some degree today. She tolerated that without any pain or complication. Subsequently I am thinking we may want to switch over to [facility] see if this may do better for her.  Patient is in agreement with giving that a trial. 05/01/2021 upon evaluation today patient appears to be doing about the same in regard to her wounds. She has been tolerating the dressing changes without complication. Fortunately there does not appear to be any signs of active infection at this time which is great news. No fevers, chills, nausea, vomiting, or diarrhea.. 06/05/2021 upon evaluation today patient appears to be doing pretty well currently in regard to her wound at the mastectomy site right chest wall. Overall since we switch back to the alginate she tells me things have significantly improved she is much happier with where things stand currently. Fortunately there does not appear to be any signs of active infection which is great news. No fevers, chills, nausea, vomiting, or diarrhea. 07/10/2021 upon evaluation today patient unfortunately does not appear to be doing nearly as well as what she previously was. There does not appear to be any evidence of new epithelial growth she has a lot of necrotic tissue the base of the wound this is much more significant than what we previously noted. She also has been having increased pain and increased drainage. In general I am very concerned about this especially in light of the fact that she does have a history of breast cancer I want to ensure that were not looking at any cancerous type lesion at this point. Otherwise also think she does need some debridement we did do MolecuLight scanning today. 07/17/2021 upon evaluation today patient appears to be doing well with regard to her wound compared to last week although she still having some erythema I am concerned in this regard. I do think that she fortunately had a negative biopsy which is great news unfortunately she did have Pseudomonas noted as a bacteria present in the culture which I think is good and need to be addressed with Levaquin. I Minna send that into the pharmacy today. We did repeat  the MolecuLight screening. 07/31/2021 upon evaluation today patient's wound is actually showing signs of improvement which is great news. Fortunately there does not appear to be any signs of infection currently locally nor systemically and I am very pleased in that regard. With that being said the patient is having some issues here with continued necrotic tissue I think packing with the Dakin's moistened gauze dressing is still in the right leg ago. 08/14/2021 upon evaluation today patient appears to be doing well with regard to her wound all things considered I do not see any signs of significant infection which is great news. Fortunately there does not appear to be any evidence of infection which is also great news. In general I think that the patient is tolerating the dressing changes well. We been using a Dakin's moistened gauze. 08/28/2021 upon evaluation today patient appears to be doing well with regard to her wound. Worsening signs of definite improvement which is great news and overall very pleased with where we stand today. There is no signs of inflamed tissue or infection which is great as well and I think the Dakin's is doing a great job here. 09/11/2021 upon evaluation today patient appears to be doing well with regard to her wound all things considered. I am can perform some debridement today  to clear away some of the necrotic debris with that being said overall I think that she is making decently good progress here which is great news. There does not appear to be any signs of active infection also good news. That in fact is probably the best news in her mind. 09/25/2021 upon evaluation today patient appears to be doing decently well in regard to her wound. Overall I do not see any signs of infection and I think she is doing quite well. I do believe that we are making headway here although is very slow due to the scarring and the depth of the wound this is a significantly deep  wound. 10/16/2021 upon evaluation today patient appears to be doing about the same in regard to the wound on her right chest wall. Fortunately there does not appear to be any signs of active infection locally nor systemically which is great news. With that being said this still was not cleaning up quite as quickly as I would like to see. Fortunately I think that we can definitely give the Santyl another try we tried this in the past and unfortunately did not have a good result but again I think she was infected at that time as well which was part of the issue. Nonetheless I think currently that we will be able to go ahead and see what we can do about getting this little cleaner with the Santyl. 11/06/2021 upon evaluation today patient appears to be doing well with regard to her wound. This actually appears to be a little bit better with the Santyl I am happy in that regard. Fortunately I do not see any signs of active infection at this time. No fevers, chills, nausea, vomiting, or diarrhea. 12/04/2021 upon evaluation patient appears to be doing well with the Santyl at this point. I am very pleased with where we stand and I think that she is making good progress here. Fortunately I do not see any evidence of active infection locally or systemically at this time which is great news. No fevers, chills, nausea, vomiting, or diarrhea. 01/01/2022 upon evaluation today patient actually is making excellent progress with the Santyl. I am extremely pleased with where we stand today. I do not see any signs of infection. 01-29-2022 upon evaluation today patient appears to be doing well with regard to her wound. This is showing signs of some improvement. It is a little less deep than what it was. Size wise is also slightly smaller but again there still is a significant wound in this area. Fortunately I do not see any evidence of infection and she is doing quite well. 02-26-2022 upon evaluation today patient appears to  be doing well with regard to her wound. In fact this is actually the best that I have seen in a very long time. I do not see any evidence of active infection at this time locally or systemically which is great news and overall I think we are on the right track. Nonetheless I do believe that the patient is continuing to show evidence of improvement with the Santyl week by week. 03-26-2022 upon evaluation today patient appears to be doing well with regard to the wound on her right mastectomy site. She is actually showing signs of excellent improvement in fact there was some bleeding just with a little bit of light cleaning over the area. Fortunately I do not see any signs of active infection locally or systemically at this time which is great news. 04-30-2022 upon  evaluation today patient appears to be doing well currently in regard to her wound. She is actually showing some signs of improvement here which is great news. Still this is very very slow. Subsequently I do believe that she may benefit from the use of [place] topical antibiotics for short amount of time before applying a skin substitute I think EpiCord could be doing very well for her as far as that is concerned. I discussed that with her today. Fortunately I do not see any evidence of active infection locally or systemically at this time which is great news. No fevers, chills, nausea, vomiting, or diarrhea. 05-28-2022 upon evaluation today patient's wound is actually showing signs of significant improvement. Fortunately I do not see any evidence of infection at this time which is great news. No fevers, chills, nausea, vomiting, or diarrhea. With that being said I do believe that the patient is tolerating the dressing changes without complication. We been using the [facility] which I think is helpful. We do have approval for the [place]. 06-11-2022 upon evaluation today patient appears to be doing well with regard to her wound she is actually here  today for the first application of [place] which I think is good to be beneficial for her. I do think that we will probably be able to keep this in place without can be the one thing of question to be honest. 06-18-2022 upon evaluation today patient actually appears to be doing excellent today. She has 1 treatment of the EpiCord underway and the wound surface already looks much better. This will be EpiCord #2 today. Fortunately I think we are on the right track here. Electronic Signature(s) Signed: 06/20/2022 7:49:58 AM By: Worthy Keeler PA-C Entered By: Worthy Keeler on 06/20/2022 07:49:57 -------------------------------------------------------------------------------- Physical Exam Details Patient Name: Date of Service: [name], [name] 06/18/2022 8:00 A M Medical Record Number: [id number] Patient Account Number: [id number] Date of Birth/Sex: Treating RN: 1951-07-16 (71 y.o. F) Primary Care Provider: [name] Other Clinician: Referring Provider: Treating Provider/Extender: [name], [name]: 95 Constitutional [name] and well-hydrated in no acute distress. Respiratory normal breathing without difficulty. Psychiatric this patient is able to make decisions and demonstrates good insight into disease process. Alert and Oriented x 3. pleasant and cooperative. Notes Upon inspection patient's wound bed actually showed signs of good granulation and epithelization at this point. Fortunately I see no evidence of active infection locally or systemically at this point which is great news no sharp debridement was necessary today. I did reapply the second [place] today and I honestly think this is doing awesome for her. Electronic Signature(s) Signed: 06/20/2022 7:50:22 AM By: Worthy Keeler PA-C Entered By: Worthy Keeler on 06/20/2022 07:50:22 -------------------------------------------------------------------------------- Physician Orders  Details Patient Name: Date of Service: [name], [name] 06/18/2022 8:00 A M Medical Record Number: [id number] Patient Account Number: [id number] Date of Birth/Sex: Treating RN: 10/23/1950 (71 y.o. [name] Primary Care Provider: [name] Other Clinician: Referring Provider: Treating Provider/Extender: [name], [name]: [phone] Verbal / Phone Orders: No Diagnosis Coding ICD-10 Coding Code Description [phone] Non-pressure chronic ulcer of skin of other sites with fat layer exposed L58.9 Radiodermatitis, unspecified L59.8 Other specified disorders of the skin and subcutaneous tissue related to radiation I10 Essential (primary) hypertension Follow-up Appointments ppointment in 1 week. - 06/25/22 @ 9:30am with Orland Jarred, RN (Room 7) Return A ppointment in 2 weeks. - 07/02/22 @ 8:45am Return A Anesthetic (  In clinic) Topical Lidocaine 5% applied to wound bed (In clinic) Topical Lidocaine 4% applied to wound bed Cellular or Tissue Based Products Cellular or Tissue Based Product Type: - Order Epicord for application on 02/20/24=852% covered daptic or Mepitel. (DO NOT REMOVE). - Cellular or Tissue Based Product applied to wound bed, secured with steri-strips, cover with A Epicord #1 06/11/22, #2 06/18/22 Bathing/ Shower/ Hygiene May shower and wash wound with soap and water. - on days that dressing is changed Additional Orders / Instructions Follow Nutritious Diet Wound Treatment Wound #1 - Breast (mastectomy site) Wound Laterality: Right Cleanser: Soap and Water 1 x Per Day/30 Days Discharge Instructions: May shower and wash wound with dial antibacterial soap and water prior to dressing change. Peri-Wound Care: Skin Prep (Generic) 1 x Per Day/30 Days Discharge Instructions: Use skin prep as directed Prim Dressing: Cutimed Sorbact Swab 1 x Per Day/30 Days ary Discharge Instructions: Apply to wound bed as instructed Secondary Dressing: ALLEVYN  Gentle Border, 5x5 (in/in) 1 x Per Day/30 Days Discharge Instructions: Apply over primary dressing as directed. Secondary Dressing: Woven Gauze Sponges 2x2 in (Generic) 1 x Per Day/30 Days Discharge Instructions: Apply over primary dressing, fold in corners to fill the space Electronic Signature(s) Signed: 06/18/2022 4:41:40 PM By: Lorrin Jackson Signed: 06/18/2022 7:10:08 PM By: Worthy Keeler PA-C Entered By: Lorrin Jackson on 06/18/2022 08:42:40 -------------------------------------------------------------------------------- Problem List Details Patient Name: Date of Service: [name], [name] 06/18/2022 8:00 A M Medical Record Number: [id number] Patient Account Number: [id number] Date of Birth/Sex: Treating RN: [birth year] (71 y.o. F) Primary Care Provider: [name] Other Clinician: Referring Provider: Treating Provider/Extender: [name], [name]: 95 Active Problems ICD-10 Encounter Code Description Active Date MDM Diagnosis L98.492 Non-pressure chronic ulcer of skin of other sites with fat layer exposed 08/22/2020 No Yes L58.9 Radiodermatitis, unspecified 08/22/2020 No Yes L59.8 Other specified disorders of the skin and subcutaneous tissue related to 08/22/2020 No Yes radiation I10 Essential (primary) hypertension 08/22/2020 No Yes Inactive Problems Resolved Problems Electronic Signature(s) Signed: 06/18/2022 7:10:08 PM By: Worthy Keeler PA-C Entered By: Worthy Keeler on 06/18/2022 08:26:44 -------------------------------------------------------------------------------- Progress Note Details Patient Name: Date of Service: [name], [name] 06/18/2022 8:00 A M Medical Record Number: [id number] Patient Account Number: [id number] Date of Birth/Sex: Treating RN: December 17, 1950 (71 y.o. 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 She had a mastectomy initially in 1998. She had chemotherapy only no radiation following. In 2002 she subsequently did undergo radiation for the first time and then subsequently in 2012 had repeat radiation after having had a finding that was consistent with a relapse of the cancer. 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 She also has radiation damage to the skin over the right chest wall where she had the mastectomy. She was referred to [place] by Dr. Matthew Saras in [facility] and her oncologist is Dr. Burney Gauze. Subsequently I want to make sure before we delve into this more deeply that the patient does not have any issues with a return of cancer that needs to be managed by them. Obviously she does have significant  scar tissue which may be benefited secondary to the soft tissue radionecrosis by hyperbaric oxygen therapy in the absence of any recurrence of the cancer. Nonetheless she does not remember exactly when her last PET scan was but it was not too recently she tells me. 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 The patient does have hypertension as well. She sees her oncologist next on December 6. 09/12/2020 on evaluation today patient actually appears to be doing excellent in regard to her wound under the right breast location. Currently this is measuring smaller in general seems to be doing very well. She tells me that the collagen is doing a good job and that the dressing that she has been putting on is not causing any troubles as far pulling on her skin or scar tissue is concerned all of which is excellent  news. 10/03/2020 upon evaluation today patient appears to be doing well with regard to her surgical site from her mastectomy on the right breast region. She tells me that she has had very little bleeding nothing seems to be sticking badly and in general she has been extremely pleased with where things stand today. No fevers, chills, nausea, vomiting, or diarrhea. 10/24/2020 upon evaluation today patient actually appears to be doing excellent in regard to her wound. There is just a very small area still open and to be honest there was minimal slough noted on the surface of the wound nothing that requires sharp debridement. In general I feel like she is actually doing quite well and overall I am pleased. I do think we may switch to silver alginate dressing and also contemplating using a AandE ointment in order to help keep everything moist and the scar tissue region while the alginate keeps it dry enough to heal 11/14/2020 upon evaluation today patient appears to be doing well at this point in regard to her wound. I do feel like that she is making good progress again this is a very difficult region over the surgical site of the right chest wall at the site of mastectomy. Nonetheless I think that we are just a very small area still open I think alginate is doing a good job helping to dry this up and I would recommend this such that we continue with this. 01/02/2021 on evaluation today patient's wound actually appears to be doing decently well. There does not appear to be any signs of active infection which is great news. She does have some slight slough buildup with biofilm on the surface of the wound mainly more biofilm. Nonetheless I was able to gently remove this with saline and gauze as well as a sterile Q-tip. She tolerated that today without complication. 01/30/2021 upon evaluation today patient appears to be doing well with regard to her wound although she still has quite a bit of trouble getting this  to close I think the scar tissue and radiation damage is quite significant here unfortunately. There does not appear to be any signs of active infection which is great news. No fevers, chills, nausea, vomiting, or diarrhea. 02/27/2021 upon evaluation today patient appears to be doing excellent in regard to her wound. She has been tolerating the dressing changes without complication and in general I am extremely pleased with where things stand today. There does not appear to be any signs of infection which is great news. No fevers, chills, nausea, vomiting, or diarrhea. With that being said I do think that she still developing  some slough buildup. I did discuss with her today the possibility of proceeding with HBO therapy. We have had this discussion before but she really is not quite committed to wanting to do that much and spend that much time in the chamber. She wants to still think about it. 03/27/2021 upon evaluation today patient appears to be doing about the same in regard to her wound. She still developing a lot of slough buildup on the surface of the wound. I did try to clean that away to some degree today. She tolerated that without any pain or complication. Subsequently I am thinking we may want to switch over to [facility] see if this may do better for her. Patient is in agreement with giving that a trial. 05/01/2021 upon evaluation today patient appears to be doing about the same in regard to her wounds. She has been tolerating the dressing changes without complication. Fortunately there does not appear to be any signs of active infection at this time which is great news. No fevers, chills, nausea, vomiting, or diarrhea.. 06/05/2021 upon evaluation today patient appears to be doing pretty well currently in regard to her wound at the mastectomy site right chest wall. Overall since we switch back to the alginate she tells me things have significantly improved she is much happier with where things  stand currently. Fortunately there does not appear to be any signs of active infection which is great news. No fevers, chills, nausea, vomiting, or diarrhea. 07/10/2021 upon evaluation today patient unfortunately does not appear to be doing nearly as well as what she previously was. There does not appear to be any evidence of new epithelial growth she has a lot of necrotic tissue the base of the wound this is much more significant than what we previously noted. She also has been having increased pain and increased drainage. In general I am very concerned about this especially in light of the fact that she does have a history of breast cancer I want to ensure that were not looking at any cancerous type lesion at this point. Otherwise also think she does need some debridement we did do Molecu[employer] scanning today. 07/17/2021 upon evaluation today patient appears to be doing well with regard to her wound compared to last week although she still having some erythema I am concerned in this regard. I do think that she fortunately had a negative biopsy which is great news unfortunately she did have Pseudomonas noted as a bacteria present in the culture which I think is good and need to be addressed with Levaquin. I Minna send that into the pharmacy today. We did repeat the Molecu[employer] screening. 07/31/2021 upon evaluation today patient's wound is actually showing signs of improvement which is great news. Fortunately there does not appear to be any signs of infection currently locally nor systemically and I am very pleased in that regard. With that being said the patient is having some issues here with continued necrotic tissue I think packing with the Dakin's moistened gauze dressing is still in the right leg ago. 08/14/2021 upon evaluation today patient appears to be doing well with regard to her wound all things considered I do not see any signs of significant infection which is great news. Fortunately there  does not appear to be any evidence of infection which is also great news. In general I think that the patient is tolerating the dressing changes well. We been using a Dakin's moistened gauze. 08/28/2021 upon evaluation today patient appears to be  doing well with regard to her wound. Worsening signs of definite improvement which is great news and overall very pleased with where we stand today. There is no signs of inflamed tissue or infection which is great as well and I think the Dakin's is doing a great job here. 09/11/2021 upon evaluation today patient appears to be doing well with regard to her wound all things considered. I am can perform some debridement today to clear away some of the necrotic debris with that being said overall I think that she is making decently good progress here which is great news. There does not appear to be any signs of active infection also good news. That in fact is probably the best news in her mind. 09/25/2021 upon evaluation today patient appears to be doing decently well in regard to her wound. Overall I do not see any signs of infection and I think she is doing quite well. I do believe that we are making headway here although is very slow due to the scarring and the depth of the wound this is a significantly deep wound. 10/16/2021 upon evaluation today patient appears to be doing about the same in regard to the wound on her right chest wall. Fortunately there does not appear to be any signs of active infection locally nor systemically which is great news. With that being said this still was not cleaning up quite as quickly as I would like to see. Fortunately I think that we can definitely give the Santyl another try we tried this in the past and unfortunately did not have a good result but again I think she was infected at that time as well which was part of the issue. Nonetheless I think currently that we will be able to go ahead and see what we can do about getting  this little cleaner with the Santyl. 11/06/2021 upon evaluation today patient appears to be doing well with regard to her wound. This actually appears to be a little bit better with the Santyl I am happy in that regard. Fortunately I do not see any signs of active infection at this time. No fevers, chills, nausea, vomiting, or diarrhea. 12/04/2021 upon evaluation patient appears to be doing well with the Santyl at this point. I am very pleased with where we stand and I think that she is making good progress here. Fortunately I do not see any evidence of active infection locally or systemically at this time which is great news. No fevers, chills, nausea, vomiting, or diarrhea. 01/01/2022 upon evaluation today patient actually is making excellent progress with the Santyl. I am extremely pleased with where we stand today. I do not see any signs of infection. 01-29-2022 upon evaluation today patient appears to be doing well with regard to her wound. This is showing signs of some improvement. It is a little less deep than what it was. Size wise is also slightly smaller but again there still is a significant wound in this area. Fortunately I do not see any evidence of infection and she is doing quite well. 02-26-2022 upon evaluation today patient appears to be doing well with regard to her wound. In fact this is actually the best that I have seen in a very long time. I do not see any evidence of active infection at this time locally or systemically which is great news and overall I think we are on the right track. Nonetheless I do believe that the patient is continuing to show evidence of  improvement with the Santyl week by week. 03-26-2022 upon evaluation today patient appears to be doing well with regard to the wound on her right mastectomy site. She is actually showing signs of excellent improvement in fact there was some bleeding just with a little bit of light cleaning over the area. Fortunately I do not see  any signs of active infection locally or systemically at this time which is great news. 04-30-2022 upon evaluation today patient appears to be doing well currently in regard to her wound. She is actually showing some signs of improvement here which is great news. Still this is very very slow. Subsequently I do believe that she may benefit from the use of [place] topical antibiotics for short amount of time before applying a skin substitute I think EpiCord could be doing very well for her as far as that is concerned. I discussed that with her today. Fortunately I do not see any evidence of active infection locally or systemically at this time which is great news. No fevers, chills, nausea, vomiting, or diarrhea. 05-28-2022 upon evaluation today patient's wound is actually showing signs of significant improvement. Fortunately I do not see any evidence of infection at this time which is great news. No fevers, chills, nausea, vomiting, or diarrhea. With that being said I do believe that the patient is tolerating the dressing changes without complication. We been using the [facility] which I think is helpful. We do have approval for the [place]. 06-11-2022 upon evaluation today patient appears to be doing well with regard to her wound she is actually here today for the first application of [place] which I think is good to be beneficial for her. I do think that we will probably be able to keep this in place without can be the one thing of question to be honest. 06-18-2022 upon evaluation today patient actually appears to be doing excellent today. She has 1 treatment of the EpiCord underway and the wound surface already looks much better. This will be EpiCord #2 today. Fortunately I think we are on the right track here. Objective Constitutional [name] and well-hydrated in no acute distress. Vitals Time Taken: 7:55 AM, Height: 63 in, Weight: 176 lbs, BMI: 31.2, Temperature: 98.3 F, Pulse: 80 bpm,  Respiratory Rate: 18 breaths/min, Blood Pressure: 163/70 mmHg. Respiratory normal breathing without difficulty. Psychiatric this patient is able to make decisions and demonstrates good insight into disease process. Alert and Oriented x 3. pleasant and cooperative. General Notes: Upon inspection patient's wound bed actually showed signs of good granulation and epithelization at this point. Fortunately I see no evidence of active infection locally or systemically at this point which is great news no sharp debridement was necessary today. I did reapply the second [place] today and I honestly think this is doing awesome for her. Integumentary (Hair, Skin) Wound #1 status is Open. Original cause of wound was Radiation Burn. The date acquired was: 07/26/2020. The wound has been in treatment 95 weeks. The wound is located on the Right Breast (mastectomy site). The wound measures 1.8cm length x 3cm width x 0.4cm depth; 4.241cm^2 area and 1.696cm^3 volume. There is Fat Layer (Subcutaneous Tissue) exposed. There is no tunneling or undermining noted. There is a medium amount of serosanguineous drainage noted. The wound margin is distinct with the outline attached to the wound base. There is large (67-100%) red, pink granulation within the wound bed. There is a small (1-33%) amount of necrotic tissue within the wound bed including Adherent Slough. 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 A skin graft procedure using a bioengineered skin substitute/cellular or tissue based product was performed by Worthy Keeler, PA with the following instrument(s): Forceps and  Scissors. Epicord was applied and secured with Steri-Strips. 6 sq cm of product was utilized and 0 sq cm was wasted. Post Application, Sorbact, Gauze was applied. A Time Out was conducted at 08:35, prior to the start of the procedure. The procedure was tolerated well. Post procedure Diagnosis Wound #1: Same as Pre-Procedure . Plan Follow-up Appointments: Return Appointment in 1 week. - 06/25/22 @ 9:30am with Orland Jarred, RN (Room 7) Return Appointment in 2 weeks. - 07/02/22 @ 8:45am Anesthetic: (In clinic) Topical Lidocaine 5% applied to wound bed (In clinic) Topical Lidocaine 4% applied to wound bed Cellular or Tissue Based Products: Cellular or Tissue Based Product Type: - Order Epicord for application on 1/61/09=604% covered Cellular or Tissue Based Product applied to wound bed, secured with steri-strips, cover with Adaptic or Mepitel. (DO NOT REMOVE). - Epicord #1 06/11/22, #2 06/18/22 Bathing/ Shower/ Hygiene: May shower and wash wound with soap and water. - on days that dressing is changed Additional Orders / Instructions: Follow Nutritious Diet WOUND #1: - Breast (mastectomy site) Wound Laterality: Right Cleanser: Soap and Water 1 x Per Day/30 Days Discharge Instructions: May shower and wash wound with dial antibacterial soap and water prior to dressing change. Peri-Wound Care: Skin Prep (Generic) 1 x Per Day/30 Days Discharge Instructions: Use skin prep as directed Prim Dressing: Cutimed Sorbact Swab 1 x Per Day/30 Days ary Discharge Instructions: Apply to wound bed as instructed Secondary Dressing: ALLEVYN Gentle Border, 5x5 (in/in) 1 x Per Day/30 Days Discharge Instructions: Apply over primary dressing as directed. Secondary Dressing: Woven Gauze Sponges 2x2 in (Generic) 1 x Per Day/30 Days Discharge Instructions: Apply over primary dressing, fold in corners to fill the space 1. I would recommend that we continue with the treatment utilizing [place] which I think is doing  awesome for the patient. 2. I am also can recommend that we have the patient continue to monitor for any signs of worsening or infection. Office if anything changes she should contact the office and let me know but otherwise we will plan to see her next week. We will see patient back for reevaluation in 1 week here in the clinic. If anything worsens or changes patient will contact our office for additional recommendations. Electronic Signature(s) Signed: 06/20/2022 7:50:48 AM By: Worthy Keeler PA-C Entered By: Worthy Keeler on 06/20/2022 07:50:48 -------------------------------------------------------------------------------- SuperBill Details Patient Name: Date of Service: [name], [place] YE 06/18/2022 Medical Record Number: [id number] Patient Account Number: [id number] Date of Birth/Sex: Treating RN: 04-24-1951 (71 y.o. [name] Primary Care Provider: [name] Other Clinician: Referring Provider: Treating Provider/Extender: [name], [name]: 95 Diagnosis Coding ICD-10 Codes Code Description [phone] Non-pressure chronic ulcer of skin of other sites with fat layer exposed L58.9 Radiodermatitis, unspecified L59.8 Other specified disorders of the skin and subcutaneous tissue related to radiation I10 Essential (primary) hypertension Facility Procedures The patient participates with Medicare or their insurance follows the Medicare Facility Guidelines: CPT4 Code Description Modifier Quantity [id number] Epicord 2cm x 3cm - per sqcm  6 ICD-10 Diagnosis Description L98.492 Non-pressure chronic ulcer of  skin of other sites with fat layer exposed The patient participates with Medicare or their insurance follows the Medicare Facility Guidelines: [id number] - SKIN SUB GRAFT TRNK/ARM/LEG 1 ICD-10 Diagnosis Description L98.492 Non-pressure chronic ulcer of skin of other sites with fat layer  exposed Physician Procedures : CPT4 Code  Description Modifier [id number] - WC PHYS SKIN SUB GRAFT TRNK/ARM/LEG ICD-10 Diagnosis Description L98.492 Non-pressure chronic ulcer of skin of other sites with fat layer exposed Quantity: 1 Electronic Signature(s) Signed: 06/18/2022 7:04:33 PM By: Worthy Keeler PA-C Previous Signature: 06/18/2022 4:41:40 PM Version By: Lorrin Jackson Entered By: Worthy Keeler on 06/18/2022 19:04:32

## 2022-06-18 NOTE — Progress Notes (Signed)
[name] ([id number]) Visit Report for 06/18/2022 Arrival Information Details Patient Name: Date of Service: [name], Conception YE 06/18/2022 8:00 A M Medical Record Number: [id number] Patient Account Number: [id number] Date of Birth/Sex: Treating RN: [birth year] (71 y.o. [name] Primary Care Maylen Waltermire: [name] Other Clinician: Referring Yolander Goodie: Treating Thoma Paulsen/Extender: [name], [name]: 95 Visit Information History Since Last Visit Added or deleted any medications: No Patient Arrived: Ambulatory Any new allergies or adverse reactions: No Arrival Time: 07:50 Had a fall or experienced change in No Transfer Assistance: None activities of daily living that may affect Patient Identification Verified: Yes risk of falls: Secondary Verification Process Completed: Yes Signs or symptoms of abuse/neglect since last visito No Patient Requires Transmission-Based Precautions: No Hospitalized since last visit: No Patient Has Alerts: No Implantable device outside of the clinic excluding No cellular tissue based products placed in the center since last visit: Has Dressing in Place as Prescribed: Yes Pain Present Now: No Electronic Signature(s) Signed: 06/18/2022 4:41:40 PM By: Lorrin Jackson Entered By: Lorrin Jackson on 06/18/2022 07:50:21 -------------------------------------------------------------------------------- Encounter Discharge Information Details Patient Name: Date of Service: [name], [name] 06/18/2022 8:00 A M Medical Record Number: [id number] Patient Account Number: [id number] Date of Birth/Sex: Treating RN: [birth year] (71 y.o. [name] Primary Care Avaya Mcjunkins: [name] Other Clinician: Referring Colletta Spillers: Treating Kimm Ungaro/Extender: [name], [name]: 95 Encounter Discharge Information Items Post Procedure Vitals Discharge Condition: Stable Temperature (F): 98.3 Ambulatory  Status: Ambulatory Pulse (bpm): 80 Discharge Destination: Home Respiratory Rate (breaths/min): 18 Transportation: Private Auto Blood Pressure (mmHg): 163/70 Schedule Follow-up Appointment: Yes Clinical Summary of Care: Provided on 06/18/2022 Form Type Recipient Paper Patient Patient Electronic Signature(s) Signed: 06/18/2022 4:41:40 PM By: Lorrin Jackson Entered By: Lorrin Jackson on 06/18/2022 08:49:54 -------------------------------------------------------------------------------- Lower Extremity Assessment Details Patient Name: Date of Service: [name], [name] 06/18/2022 8:00 A M Medical Record Number: [id number] Patient Account Number: [id number] Date of Birth/Sex: Treating RN: [birth year] (71 y.o. [name] Primary Care Harshil Cavallaro: [name] Other Clinician: Referring Owynn Mosqueda: Treating Greig Altergott/Extender: [name], [name]: 95 Electronic Signature(s) Signed: 06/18/2022 4:41:40 PM By: Lorrin Jackson Entered By: Lorrin Jackson on 06/18/2022 07:57:43 -------------------------------------------------------------------------------- Multi-Disciplinary Care Plan Details Patient Name: Date of Service: [name], [name] 06/18/2022 8:00 A M Medical Record Number: [id number] Patient Account Number: [id number] Date of Birth/Sex: Treating RN: [birth year] (71 y.o. [name] Primary Care Gracen Southwell: [name] Other Clinician: Referring Marcha Licklider: Treating Alece Koppel/Extender: [name], [name] in Treatment: Tom Bean reviewed with physician Active Inactive Necrotic Tissue Nursing Diagnoses: Knowledge deficit related to management of necrotic/devitalized tissue Goals: Necrotic/devitalized tissue will be minimized in the wound bed Date Initiated: 07/10/2021 Target Resolution Date: 07/09/2022 Goal Status: Active Interventions: Provide education on necrotic tissue and debridement  process Treatment Activities: Excisional debridement : 07/10/2021 Notes: 01/01/22: Continues to use Santyl 06/11/22: Wound cleaner Wound/Skin Impairment Nursing Diagnoses: Impaired tissue integrity Knowledge deficit related to ulceration/compromised skin integrity Goals: Patient/caregiver will verbalize understanding of skin care regimen Date Initiated: 08/22/2020 Target Resolution Date: 07/09/2022 Goal Status: Active Ulcer/skin breakdown will have a volume reduction of 30% by week 4 Date Initiated: 08/22/2020 Date Inactivated: 10/03/2020 Target Resolution Date: 09/19/2020 Goal Status: Met Ulcer/skin breakdown will have a volume reduction of 50% by week 8 Date Initiated: 06/05/2021 Date Inactivated: 07/17/2021 Target Resolution Date: 07/03/2021 Goal Status: Unmet Unmet Reason: infection Interventions: Assess patient/caregiver ability to obtain necessary supplies Assess  patient/caregiver ability to perform ulcer/skin care regimen upon admission and as needed Assess ulceration(s) every visit Treatment Activities: Skin care regimen initiated : 08/22/2020 Topical wound management initiated : 08/22/2020 Notes: 06/05/21: Wound care regimen ongoing. 06/11/22: Wound care regimen continues, applying skin sub ([place]picord) Electronic Signature(s) Signed: 06/18/2022 4:41:40 PM By: Lorrin Jackson Entered By: Lorrin Jackson on 06/18/2022 07:49:47 -------------------------------------------------------------------------------- Pain Assessment Details Patient Name: Date of Service: [name], [name] 06/18/2022 8:00 A M Medical Record Number: [id number] Patient Account Number: [id number] Date of Birth/Sex: Treating RN: 1951/02/05 (71 y.o. [name] Primary Care Avalin Briley: [name] Other Clinician: Referring Jeanmarie Mccowen: Treating Paulene Tayag/Extender: [name], [name]: 95 Active Problems Location of Pain Severity and Description of Pain Patient Has Paino  No Site Locations Pain Management and Medication Current Pain Management: Electronic Signature(s) Signed: 06/18/2022 4:41:40 PM By: Lorrin Jackson Entered By: Lorrin Jackson on 06/18/2022 07:57:36 -------------------------------------------------------------------------------- Patient/Caregiver Education Details Patient Name: Date of Service: [name], [name] 9/6/2023andnbsp8:00 A M Medical Record Number: [id number] Patient Account Number: [id number] Date of Birth/Gender: Treating RN: [birth year] (71 y.o. [name] Primary Care Physician: [name] Other Clinician: Referring Physician: Treating Physician/Extender: [name], [name] in Treatment: [phone] Education Assessment Education Provided To: Patient Education Topics Provided Wound/Skin Impairment: Methods: Demonstration, Explain/Verbal, Printed Responses: State content correctly Electronic Signature(s) Signed: 06/18/2022 4:41:40 PM By: Lorrin Jackson Entered By: Lorrin Jackson on 06/18/2022 07:50:01 -------------------------------------------------------------------------------- Wound Assessment Details Patient Name: Date of Service: [name], [name] 06/18/2022 8:00 A M Medical Record Number: [id number] Patient Account Number: [id number] Date of Birth/Sex: Treating RN: 1950-10-24 (71 y.o. [name] Primary Care Dedee Liss: [name] Other Clinician: Referring Tyshana Nishida: Treating Al Gagen/Extender: [name], [name]: 95 Wound Status Wound Number: 1 Primary Soft Tissue Radionecrosis Etiology: Wound Location: Right Breast (mastectomy site) Wound Open Wounding Event: Radiation Burn Status: Date Acquired: 07/26/2020 Comorbid Anemia, Lymphedema, Deep Vein Thrombosis, Hypertension, Weeks Of Treatment: 95 History: Peripheral Venous Disease, Osteoarthritis, Received Clustered Wound: No Chemotherapy, Received Radiation, Confinement  Anxiety Photos Wound Measurements Length: (cm) 1.8 Width: (cm) 3 Depth: (cm) 0.4 Area: (cm) 4.241 Volume: (cm) 1.696 % Reduction in Area: -176.8% % Reduction in Volume: -1008.5% Epithelialization: None Tunneling: No Undermining: No Wound Description Classification: Full Thickness Without Exposed Support Structures Wound Margin: Distinct, outline attached Exudate Amount: Medium Exudate Type: Serosanguineous Exudate Color: red, brown Foul Odor After Cleansing: No Slough/Fibrino Yes Wound Bed Granulation Amount: Large (67-100%) Exposed Structure Granulation Quality: Red, Pink Fascia Exposed: No Necrotic Amount: Small (1-33%) Fat Layer (Subcutaneous Tissue) Exposed: Yes Necrotic Quality: Adherent Slough Tendon Exposed: No Muscle Exposed: No Joint Exposed: No Bone Exposed: No Treatment Notes Wound #1 (Breast (mastectomy site)) Wound Laterality: Right Cleanser Soap and Water Discharge Instruction: May shower and wash wound with dial antibacterial soap and water prior to dressing change. Peri-Wound Care Skin Prep Discharge Instruction: Use skin prep as directed Topical Primary Dressing Cutimed Sorbact Swab Discharge Instruction: Apply to wound bed as instructed Secondary Dressing ALLEVYN Gentle Border, 5x5 (in/in) Discharge Instruction: Apply over primary dressing as directed. Woven Gauze Sponges 2x2 in Discharge Instruction: Apply over primary dressing, fold in corners to fill the space Secured With Compression Wrap Compression Stockings Add-Ons Electronic Signature(s) Signed: 06/18/2022 4:41:40 PM By: Lorrin Jackson Entered By: Lorrin Jackson on 06/18/2022 08:02:04 -------------------------------------------------------------------------------- Vitals Details Patient Name: Date of Service: [name], [name] 06/18/2022 8:00 A M Medical Record Number: [id number] Patient Account Number: [id number] Date of Birth/Sex:  Treating RN: [birth year] (71 y.o. [name] Primary Care Deuce Paternoster: [name] Other Clinician: Referring Briteny Fulghum: Treating Lazara Grieser/Extender: [name], [name]: 95 Vital Signs Time Taken: 07:55 Temperature (F): 98.3 Height (in): 63 Pulse (bpm): 80 Weight (lbs): 176 Respiratory Rate (breaths/min): 18 Body Mass Index (BMI): 31.2 Blood Pressure (mmHg): 163/70 Reference Range: 80 - 120 mg / dl Electronic Signature(s) Signed: 06/18/2022 4:41:40 PM By: Lorrin Jackson Entered By: Lorrin Jackson on 06/18/2022 07:56:58

## 2022-06-25 ENCOUNTER — Encounter (HOSPITAL_BASED_OUTPATIENT_CLINIC_OR_DEPARTMENT_OTHER): Payer: Medicare Other | Admitting: Physician Assistant

## 2022-06-25 DIAGNOSIS — L589 Radiodermatitis, unspecified: Secondary | ICD-10-CM | POA: Diagnosis not present

## 2022-06-25 DIAGNOSIS — I10 Essential (primary) hypertension: Secondary | ICD-10-CM | POA: Diagnosis not present

## 2022-06-25 DIAGNOSIS — Z86718 Personal history of other venous thrombosis and embolism: Secondary | ICD-10-CM | POA: Diagnosis not present

## 2022-06-25 DIAGNOSIS — L98492 Non-pressure chronic ulcer of skin of other sites with fat layer exposed: Secondary | ICD-10-CM | POA: Diagnosis not present

## 2022-06-25 DIAGNOSIS — L598 Other specified disorders of the skin and subcutaneous tissue related to radiation: Secondary | ICD-10-CM | POA: Diagnosis not present

## 2022-06-25 DIAGNOSIS — I890 Lymphedema, not elsewhere classified: Secondary | ICD-10-CM | POA: Diagnosis not present

## 2022-06-25 NOTE — Progress Notes (Addendum)
[name], [name] ([id number]) Visit Report for 06/25/2022 Chief Complaint Document Details Patient Name: Date of Service: [name], [name] 06/25/2022 9:30 A M Medical Record Number: [id number] Patient Account Number: [id number] Date of Birth/Sex: Treating RN: 02-10-1951 (71 y.o. 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 [name] Primary Care Provider: [name] Other Clinician: Referring Provider: Treating Provider/Extender: [name], [name]: 96 Cellular or Tissue Based Product Type Wound #1 Right Breast (mastectomy site) Applied to: Performed By: Physician Worthy Keeler, PA Cellular or Tissue Based Product Type: Epicord Level of Consciousness (Pre-procedure): Awake and Alert Pre-procedure Verification/Time Out Yes - 09:47 Taken: Location: trunk / arms / legs Wound Size (sq cm): 4.68 Product Size (sq cm): 6 Waste Size (sq cm): 0 Amount of Product Applied (sq cm): 6 Instrument Used: Forceps, Scissors Lot #: [id number] Expiration Date: 02/11/2027 Fenestrated: No Reconstituted: Yes Solution Type: Wound Cleaner Secured: Yes Secured With: cutimed sorbact Dressing Applied: Yes Primary Dressing: Gauze, Allevyn Response to Treatment: Procedure was tolerated well Level  of Consciousness (Post- Awake and Alert procedure): Post Procedure Diagnosis Same as Pre-procedure Electronic Signature(s) Signed: 06/25/2022 5:04:50 PM By: Lorrin Jackson Signed: 06/25/2022 5:46:43 PM By: Worthy Keeler PA-C Entered By: Lorrin Jackson on 06/25/2022 09:49:46 -------------------------------------------------------------------------------- HPI Details Patient Name: Date of Service: [name], [name] 06/25/2022 9:30 A M Medical Record Number: [id number] Patient Account Number: [id number] Date of Birth/Sex: Treating RN: [birth year] (71 y.o. F) Primary Care Provider: [name] Other Clinician: Referring Provider: Treating Provider/Extender: [name], [name]: 96 History of Present Illness HPI Description: 08/22/2020 upon evaluation today patient actually appears to be doing somewhat poorly in regard to her mastectomy site on the right chest wall. She had a mastectomy initially in 1998. She had chemotherapy only no radiation following. In 2002 she subsequently did undergo radiation for the first time and then subsequently in 2012 had repeat radiation after having had a finding that was consistent with a relapse of the cancer. 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 She also has radiation damage to the skin over the right chest wall where she had the mastectomy. She was referred to [place] by Dr. Matthew Saras in [facility] and her oncologist is Dr. Burney Gauze. Subsequently I want to make sure before we delve into this more deeply that the patient does not have any issues with a return of cancer that needs to be managed by them. 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 Nonetheless she does not remember exactly when her last PET scan was but it was  not too recently she tells me. 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 The patient does have hypertension as well. She sees her oncologist next on December 6. 09/12/2020 on evaluation today patient actually  appears to be doing excellent in regard to her wound under the right breast location. Currently this is measuring smaller in general seems to be doing very well. She tells me that the collagen is doing a good job and that the dressing that she has been putting on is not causing any troubles as far pulling on her skin or scar tissue is concerned all of which is excellent news. 10/03/2020 upon evaluation today patient appears to be doing well with regard to her surgical site from her mastectomy on the right breast region. She tells me that she has had very little bleeding nothing seems to be sticking badly and in general she has been extremely pleased with where things stand today. No fevers, chills, nausea, vomiting, or diarrhea. 10/24/2020 upon evaluation today patient actually appears to be doing excellent in regard to her wound. There is just a very small area still open and to be honest there was minimal slough noted on the surface of the wound nothing that requires sharp debridement. In general I feel like she is actually doing quite well and overall I am pleased. I do think we may switch to silver alginate dressing and also contemplating using a AandE ointment in order to help keep everything moist and the scar tissue region while the alginate keeps it dry enough to heal 11/14/2020 upon evaluation today patient appears to be doing well at this point in regard to her wound. I do feel like that she is making good progress again this is a very difficult region over the surgical site of the right chest wall at the site of mastectomy. Nonetheless I think that we are just a very small area still open I think alginate is doing a good job helping to dry this up and I would recommend this  such that we continue with this. 01/02/2021 on evaluation today patient's wound actually appears to be doing decently well. There does not appear to be any signs of active infection which is great news. She does have some slight slough buildup with biofilm on the surface of the wound mainly more biofilm. Nonetheless I was able to gently remove this with saline and gauze as well as a sterile Q-tip. She tolerated that today without complication. 01/30/2021 upon evaluation today patient appears to be doing well with regard to her wound although she still has quite a bit of trouble getting this to close I think the scar tissue and radiation damage is quite significant here unfortunately. There does not appear to be any signs of active infection which is great news. No fevers, chills, nausea, vomiting, or diarrhea. 02/27/2021 upon evaluation today patient appears to be doing excellent in regard to her wound. She has been tolerating the dressing changes without complication and in general I am extremely pleased with where things stand today. There does not appear to be any signs of infection which is great news. No fevers, chills, nausea, vomiting, or diarrhea. With that being said I do think that she still developing some slough buildup. I did discuss with her today the possibility of proceeding with HBO therapy. We have had this discussion before but she really is not quite committed to wanting to do that much and spend that much time in the chamber. She wants to still think about it. 03/27/2021 upon evaluation today patient appears to be doing about the same in regard to her wound. She still developing a lot of slough buildup on the surface of the wound. I  did try to clean that away to some degree today. She tolerated that without any pain or complication. Subsequently I am thinking we may want to switch over to [facility] see if this may do better for her. Patient is in agreement with giving that a  trial. 05/01/2021 upon evaluation today patient appears to be doing about the same in regard to her wounds. She has been tolerating the dressing changes without complication. Fortunately there does not appear to be any signs of active infection at this time which is great news. No fevers, chills, nausea, vomiting, or diarrhea.. 06/05/2021 upon evaluation today patient appears to be doing pretty well currently in regard to her wound at the mastectomy site right chest wall. Overall since we switch back to the alginate she tells me things have significantly improved she is much happier with where things stand currently. Fortunately there does not appear to be any signs of active infection which is great news. No fevers, chills, nausea, vomiting, or diarrhea. 07/10/2021 upon evaluation today patient unfortunately does not appear to be doing nearly as well as what she previously was. There does not appear to be any evidence of new epithelial growth she has a lot of necrotic tissue the base of the wound this is much more significant than what we previously noted. She also has been having increased pain and increased drainage. In general I am very concerned about this especially in light of the fact that she does have a history of breast cancer I want to ensure that were not looking at any cancerous type lesion at this point. Otherwise also think she does need some debridement we did do MolecuLight scanning today. 07/17/2021 upon evaluation today patient appears to be doing well with regard to her wound compared to last week although she still having some erythema I am concerned in this regard. I do think that she fortunately had a negative biopsy which is great news unfortunately she did have Pseudomonas noted as a bacteria present in the culture which I think is good and need to be addressed with Levaquin. I Minna send that into the pharmacy today. We did repeat the MolecuLight screening. 07/31/2021 upon  evaluation today patient's wound is actually showing signs of improvement which is great news. Fortunately there does not appear to be any signs of infection currently locally nor systemically and I am very pleased in that regard. With that being said the patient is having some issues here with continued necrotic tissue I think packing with the Dakin's moistened gauze dressing is still in the right leg ago. 08/14/2021 upon evaluation today patient appears to be doing well with regard to her wound all things considered I do not see any signs of significant infection which is great news. Fortunately there does not appear to be any evidence of infection which is also great news. In general I think that the patient is tolerating the dressing changes well. We been using a Dakin's moistened gauze. 08/28/2021 upon evaluation today patient appears to be doing well with regard to her wound. Worsening signs of definite improvement which is great news and overall very pleased with where we stand today. There is no signs of inflamed tissue or infection which is great as well and I think the Dakin's is doing a great job here. 09/11/2021 upon evaluation today patient appears to be doing well with regard to her wound all things considered. I am can perform some debridement today to clear away some of the necrotic  debris with that being said overall I think that she is making decently good progress here which is great news. There does not appear to be any signs of active infection also good news. That in fact is probably the best news in her mind. 09/25/2021 upon evaluation today patient appears to be doing decently well in regard to her wound. Overall I do not see any signs of infection and I think she is doing quite well. I do believe that we are making headway here although is very slow due to the scarring and the depth of the wound this is a significantly deep wound. 10/16/2021 upon evaluation today patient appears  to be doing about the same in regard to the wound on her right chest wall. Fortunately there does not appear to be any signs of active infection locally nor systemically which is great news. With that being said this still was not cleaning up quite as quickly as I would like to see. Fortunately I think that we can definitely give the Santyl another try we tried this in the past and unfortunately did not have a good result but again I think she was infected at that time as well which was part of the issue. Nonetheless I think currently that we will be able to go ahead and see what we can do about getting this little cleaner with the Santyl. 11/06/2021 upon evaluation today patient appears to be doing well with regard to her wound. This actually appears to be a little bit better with the Santyl I am happy in that regard. Fortunately I do not see any signs of active infection at this time. No fevers, chills, nausea, vomiting, or diarrhea. 12/04/2021 upon evaluation patient appears to be doing well with the Santyl at this point. I am very pleased with where we stand and I think that she is making good progress here. Fortunately I do not see any evidence of active infection locally or systemically at this time which is great news. No fevers, chills, nausea, vomiting, or diarrhea. 01/01/2022 upon evaluation today patient actually is making excellent progress with the Santyl. I am extremely pleased with where we stand today. I do not see any signs of infection. 01-29-2022 upon evaluation today patient appears to be doing well with regard to her wound. This is showing signs of some improvement. It is a little less deep than what it was. Size wise is also slightly smaller but again there still is a significant wound in this area. Fortunately I do not see any evidence of infection and she is doing quite well. 02-26-2022 upon evaluation today patient appears to be doing well with regard to her wound. In fact this is  actually the best that I have seen in a very long time. I do not see any evidence of active infection at this time locally or systemically which is great news and overall I think we are on the right track. Nonetheless I do believe that the patient is continuing to show evidence of improvement with the Santyl week by week. 03-26-2022 upon evaluation today patient appears to be doing well with regard to the wound on her right mastectomy site. She is actually showing signs of excellent improvement in fact there was some bleeding just with a little bit of light cleaning over the area. Fortunately I do not see any signs of active infection locally or systemically at this time which is great news. 04-30-2022 upon evaluation today patient appears to be doing  well currently in regard to her wound. She is actually showing some signs of improvement here which is great news. Still this is very very slow. Subsequently I do believe that she may benefit from the use of [place] topical antibiotics for short amount of time before applying a skin substitute I think EpiCord could be doing very well for her as far as that is concerned. I discussed that with her today. Fortunately I do not see any evidence of active infection locally or systemically at this time which is great news. No fevers, chills, nausea, vomiting, or diarrhea. 05-28-2022 upon evaluation today patient's wound is actually showing signs of significant improvement. Fortunately I do not see any evidence of infection at this time which is great news. No fevers, chills, nausea, vomiting, or diarrhea. With that being said I do believe that the patient is tolerating the dressing changes without complication. We been using the [facility] which I think is helpful. We do have approval for the [place]. 06-11-2022 upon evaluation today patient appears to be doing well with regard to her wound she is actually here today for the first application of [place] which  I think is good to be beneficial for her. I do think that we will probably be able to keep this in place without can be the one thing of question to be honest. 06-18-2022 upon evaluation today patient actually appears to be doing excellent today. She has 1 treatment of the EpiCord underway and the wound surface already looks much better. This will be EpiCord #2 today. Fortunately I think we are on the right track here. 06-25-2022 upon evaluation today patient appears to be making good progress and overall the patient seems to have tolerated EpiCord without any complication. I do feel like that the wound is improving quite significantly. This is EpiCord #3 today. Electronic Signature(s) Signed: 06/25/2022 10:04:48 AM By: Worthy Keeler PA-C Entered By: Worthy Keeler on 06/25/2022 10:04:47 -------------------------------------------------------------------------------- Physical Exam Details Patient Name: Date of Service: [name], [name] 06/25/2022 9:30 A M Medical Record Number: [id number] Patient Account Number: [id number] Date of Birth/Sex: Treating RN: May 20, 1951 (71 y.o. F) Primary Care Provider: Other Clinician: [name] Referring Provider: Treating Provider/Extender: [name], [name]: 96 Constitutional [name] and well-hydrated in no acute distress. Respiratory normal breathing without difficulty. Psychiatric this patient is able to make decisions and demonstrates good insight into disease process. Alert and Oriented x 3. pleasant and cooperative. Notes Upon evaluation of the wound I feel like the area of undermining when facing the patient at the 10-12 o'clock location does seem to be filling in to some degree this is still very slow but again the tissue appears to be much more healthy compared to what it was previous. No sharp debridement was needed today. I am going to go ahead and reapply the [place] which I think is doing quite  well for her. Electronic Signature(s) Signed: 06/25/2022 10:05:17 AM By: Worthy Keeler PA-C Entered By: Worthy Keeler on 06/25/2022 10:05:17 -------------------------------------------------------------------------------- Physician Orders Details Patient Name: Date of Service: [name], [name] 06/25/2022 9:30 A M Medical Record Number: [id number] Patient Account Number: [id number] Date of Birth/Sex: Treating RN: [birth year] (71 y.o. [name] Primary Care Provider: [name] Other Clinician: Referring Provider: Treating Provider/Extender: [name], [name]: [phone] Verbal / Phone Orders: No Diagnosis Coding ICD-10 Coding Code Description L98.492 Non-pressure chronic ulcer of skin of other sites with fat layer  exposed L58.9 Radiodermatitis, unspecified L59.8 Other specified disorders of the skin and subcutaneous tissue related to radiation I10 Essential (primary) hypertension Follow-up Appointments ppointment in 1 week. - 07/02/22 @ 8:45am with Orland Jarred, RN (Room 7) Return A Anesthetic (In clinic) Topical Lidocaine 5% applied to wound bed (In clinic) Topical Lidocaine 4% applied to wound bed Cellular or Tissue Based Products Cellular or Tissue Based Product Type: - Order Epicord for application on 3/00/76=226% covered daptic or Mepitel. (DO NOT REMOVE). - Cellular or Tissue Based Product applied to wound bed, secured with steri-strips, cover with A Epicord: #1 06/11/22, #2 06/18/22, #3 06/25/22 Bathing/ Shower/ Hygiene May shower with protection but do not get wound dressing(s) wet. Additional Orders / Instructions Follow Nutritious Diet Wound Treatment Wound #1 - Breast (mastectomy site) Wound Laterality: Right Cleanser: Soap and Water 1 x Per Day/30 Days Discharge Instructions: May shower and wash wound with dial antibacterial soap and water prior to dressing change. Peri-Wound Care: Skin Prep (Generic) 1 x Per Day/30  Days Discharge Instructions: Use skin prep as directed Prim Dressing: Cutimed Sorbact Swab 1 x Per Day/30 Days ary Discharge Instructions: Apply to wound bed as instructed Secondary Dressing: ALLEVYN Gentle Border, 5x5 (in/in) 1 x Per Day/30 Days Discharge Instructions: Apply over primary dressing as directed. Secondary Dressing: Woven Gauze Sponges 2x2 in (Generic) 1 x Per Day/30 Days Discharge Instructions: Apply over primary dressing, fold in corners to fill the space Electronic Signature(s) Signed: 06/25/2022 5:04:50 PM By: Lorrin Jackson Signed: 06/25/2022 5:46:43 PM By: Worthy Keeler PA-C Entered By: Lorrin Jackson on 06/25/2022 09:50:51 -------------------------------------------------------------------------------- Problem List Details Patient Name: Date of Service: [name], [name] 06/25/2022 9:30 A M Medical Record Number: [id number] Patient Account Number: [id number] Date of Birth/Sex: Treating RN: Feb 23, 1951 (71 y.o. 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 exposed 08/22/2020 No Yes L58.9 Radiodermatitis, unspecified 08/22/2020 No Yes L59.8 Other specified disorders of the skin and subcutaneous tissue related to 08/22/2020 No Yes radiation I10 Essential (primary) hypertension 08/22/2020 No Yes Inactive Problems Resolved Problems Electronic Signature(s) Signed: 06/25/2022 9:24:44 AM By: Worthy Keeler PA-C Entered By: Worthy Keeler on 06/25/2022 09:24:44 -------------------------------------------------------------------------------- Progress Note Details Patient Name: Date of Service: [name], [name] 06/25/2022 9:30 A M Medical Record Number: [id number] Patient Account Number: [id number] Date of  Birth/Sex: Treating RN: [birth year] (71 y.o. 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 She had a mastectomy initially in 1998. She had chemotherapy only no radiation following. In 2002 she subsequently did undergo radiation for the first time and then subsequently in 2012 had repeat radiation after having had a finding that was consistent with a relapse of the cancer. 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 She also has radiation damage to the skin over the right chest wall where she had the mastectomy. She was referred to [place] by Dr. Matthew Saras in [facility] and her oncologist is Dr. Burney Gauze.  Subsequently I want to make sure before we delve into this more deeply that the patient does not have any issues with a return of cancer that needs to be managed by them. 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 Nonetheless she does not remember exactly when her last PET scan was but it was not too recently she tells me. 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 The patient does have hypertension as well. She sees her oncologist next on December 6. 09/12/2020 on evaluation today patient actually appears to be doing excellent in regard to her wound under the right breast location. Currently this is measuring smaller in general seems to be doing very well. She tells me that  the collagen is doing a good job and that the dressing that she has been putting on is not causing any troubles as far pulling on her skin or scar tissue is concerned all of which is excellent news. 10/03/2020 upon evaluation today patient appears to be doing well with regard to her surgical site from her mastectomy on the right breast region. She tells me that she has had very little bleeding nothing seems to be sticking badly and in general she has been extremely pleased with where things stand today. No fevers, chills, nausea, vomiting, or diarrhea. 10/24/2020 upon evaluation today patient actually appears to be doing excellent in regard to her wound. There is just a very small area still open and to be honest there was minimal slough noted on the surface of the wound nothing that requires sharp debridement. In general I feel like she is actually doing quite well and overall I am pleased. I do think we may switch to silver alginate dressing and also contemplating using a AandE ointment in order to help keep everything moist and the scar tissue region while the alginate keeps it dry enough to heal 11/14/2020 upon evaluation today patient appears to be doing well at this point in regard to her wound. I do feel like that she is making good progress again this is a very difficult region over the surgical site of the right chest wall at the site of mastectomy. Nonetheless I think that we are just a very small area still open I think alginate is doing a good job helping to dry this up and I would recommend this such that we continue with this. 01/02/2021 on evaluation today patient's wound actually appears to be doing decently well. There does not appear to be any signs of active infection which is great news. She does have some slight slough buildup with biofilm on the surface of the wound mainly more biofilm. Nonetheless I was able to gently remove this with saline and gauze as well as a sterile Q-tip. She  tolerated that today without complication. 01/30/2021 upon evaluation today patient appears to be doing well with regard to her wound although she still has quite a bit of trouble getting this to close I think the scar tissue and radiation damage is quite significant here unfortunately. There does not appear to be any signs of active infection which is great news. No fevers, chills, nausea, vomiting, or diarrhea. 02/27/2021 upon evaluation today patient appears to be doing excellent in regard to her wound. She has been tolerating the dressing changes without complication and in general I am extremely  pleased with where things stand today. There does not appear to be any signs of infection which is great news. No fevers, chills, nausea, vomiting, or diarrhea. With that being said I do think that she still developing some slough buildup. I did discuss with her today the possibility of proceeding with HBO therapy. We have had this discussion before but she really is not quite committed to wanting to do that much and spend that much time in the chamber. She wants to still think about it. 03/27/2021 upon evaluation today patient appears to be doing about the same in regard to her wound. She still developing a lot of slough buildup on the surface of the wound. I did try to clean that away to some degree today. She tolerated that without any pain or complication. Subsequently I am thinking we may want to switch over to [facility] see if this may do better for her. Patient is in agreement with giving that a trial. 05/01/2021 upon evaluation today patient appears to be doing about the same in regard to her wounds. She has been tolerating the dressing changes without complication. Fortunately there does not appear to be any signs of active infection at this time which is great news. No fevers, chills, nausea, vomiting, or diarrhea.. 06/05/2021 upon evaluation today patient appears to be doing pretty well currently in  regard to her wound at the mastectomy site right chest wall. Overall since we switch back to the alginate she tells me things have significantly improved she is much happier with where things stand currently. Fortunately there does not appear to be any signs of active infection which is great news. No fevers, chills, nausea, vomiting, or diarrhea. 07/10/2021 upon evaluation today patient unfortunately does not appear to be doing nearly as well as what she previously was. There does not appear to be any evidence of new epithelial growth she has a lot of necrotic tissue the base of the wound this is much more significant than what we previously noted. She also has been having increased pain and increased drainage. In general I am very concerned about this especially in light of the fact that she does have a history of breast cancer I want to ensure that were not looking at any cancerous type lesion at this point. Otherwise also think she does need some debridement we did do Molecu[employer] scanning today. 07/17/2021 upon evaluation today patient appears to be doing well with regard to her wound compared to last week although she still having some erythema I am concerned in this regard. I do think that she fortunately had a negative biopsy which is great news unfortunately she did have Pseudomonas noted as a bacteria present in the culture which I think is good and need to be addressed with Levaquin. I Minna send that into the pharmacy today. We did repeat the MolecuLight screening. 07/31/2021 upon evaluation today patient's wound is actually showing signs of improvement which is great news. Fortunately there does not appear to be any signs of infection currently locally nor systemically and I am very pleased in that regard. With that being said the patient is having some issues here with continued necrotic tissue I think packing with the Dakin's moistened gauze dressing is still in the right leg  ago. 08/14/2021 upon evaluation today patient appears to be doing well with regard to her wound all things considered I do not see any signs of significant infection which is great news. Fortunately there does not appear to  be any evidence of infection which is also great news. In general I think that the patient is tolerating the dressing changes well. We been using a Dakin's moistened gauze. 08/28/2021 upon evaluation today patient appears to be doing well with regard to her wound. Worsening signs of definite improvement which is great news and overall very pleased with where we stand today. There is no signs of inflamed tissue or infection which is great as well and I think the Dakin's is doing a great job here. 09/11/2021 upon evaluation today patient appears to be doing well with regard to her wound all things considered. I am can perform some debridement today to clear away some of the necrotic debris with that being said overall I think that she is making decently good progress here which is great news. There does not appear to be any signs of active infection also good news. That in fact is probably the best news in her mind. 09/25/2021 upon evaluation today patient appears to be doing decently well in regard to her wound. Overall I do not see any signs of infection and I think she is doing quite well. I do believe that we are making headway here although is very slow due to the scarring and the depth of the wound this is a significantly deep wound. 10/16/2021 upon evaluation today patient appears to be doing about the same in regard to the wound on her right chest wall. Fortunately there does not appear to be any signs of active infection locally nor systemically which is great news. With that being said this still was not cleaning up quite as quickly as I would like to see. Fortunately I think that we can definitely give the Santyl another try we tried this in the past and unfortunately did not  have a good result but again I think she was infected at that time as well which was part of the issue. Nonetheless I think currently that we will be able to go ahead and see what we can do about getting this little cleaner with the Santyl. 11/06/2021 upon evaluation today patient appears to be doing well with regard to her wound. This actually appears to be a little bit better with the Santyl I am happy in that regard. Fortunately I do not see any signs of active infection at this time. No fevers, chills, nausea, vomiting, or diarrhea. 12/04/2021 upon evaluation patient appears to be doing well with the Santyl at this point. I am very pleased with where we stand and I think that she is making good progress here. Fortunately I do not see any evidence of active infection locally or systemically at this time which is great news. No fevers, chills, nausea, vomiting, or diarrhea. 01/01/2022 upon evaluation today patient actually is making excellent progress with the Santyl. I am extremely pleased with where we stand today. I do not see any signs of infection. 01-29-2022 upon evaluation today patient appears to be doing well with regard to her wound. This is showing signs of some improvement. It is a little less deep than what it was. Size wise is also slightly smaller but again there still is a significant wound in this area. Fortunately I do not see any evidence of infection and she is doing quite well. 02-26-2022 upon evaluation today patient appears to be doing well with regard to her wound. In fact this is actually the best that I have seen in a very long time. I do not see  any evidence of active infection at this time locally or systemically which is great news and overall I think we are on the right track. Nonetheless I do believe that the patient is continuing to show evidence of improvement with the Santyl week by week. 03-26-2022 upon evaluation today patient appears to be doing well with regard to  the wound on her right mastectomy site. She is actually showing signs of excellent improvement in fact there was some bleeding just with a little bit of light cleaning over the area. Fortunately I do not see any signs of active infection locally or systemically at this time which is great news. 04-30-2022 upon evaluation today patient appears to be doing well currently in regard to her wound. She is actually showing some signs of improvement here which is great news. Still this is very very slow. Subsequently I do believe that she may benefit from the use of Keystone topical antibiotics for short amount of time before applying a skin substitute I think EpiCord could be doing very well for her as far as that is concerned. I discussed that with her today. Fortunately I do not see any evidence of active infection locally or systemically at this time which is great news. No fevers, chills, nausea, vomiting, or diarrhea. 05-28-2022 upon evaluation today patient's wound is actually showing signs of significant improvement. Fortunately I do not see any evidence of infection at this time which is great news. No fevers, chills, nausea, vomiting, or diarrhea. With that being said I do believe that the patient is tolerating the dressing changes without complication. We been using the [facility] which I think is helpful. We do have approval for the Jay. 06-11-2022 upon evaluation today patient appears to be doing well with regard to her wound she is actually here today for the first application of Sullivan which I think is good to be beneficial for her. I do think that we will probably be able to keep this in place without can be the one thing of question to be honest. 06-18-2022 upon evaluation today patient actually appears to be doing excellent today. She has 1 treatment of the EpiCord underway and the wound surface already looks much better. This will be EpiCord #2 today. Fortunately I think we are on the right  track here. 06-25-2022 upon evaluation today patient appears to be making good progress and overall the patient seems to have tolerated EpiCord without any complication. I do feel like that the wound is improving quite significantly. This is EpiCord #3 today. Objective Constitutional [name] and well-hydrated in no acute distress. Vitals Time Taken: 9:20 AM, Height: 63 in, Weight: 176 lbs, BMI: 31.2, Temperature: 97.7 F, Pulse: 77 bpm, Respiratory Rate: 18 breaths/min, Blood Pressure: 172/72 mmHg. Respiratory normal breathing without difficulty. Psychiatric this patient is able to make decisions and demonstrates good insight into disease process. Alert and Oriented x 3. pleasant and cooperative. General Notes: Upon evaluation of the wound I feel like the area of undermining when facing the patient at the 10-12 o'clock location does seem to be filling in to some degree this is still very slow but again the tissue appears to be much more healthy compared to what it was previous. No sharp debridement was needed today. I am going to go ahead and reapply the Loaza which I think is doing quite well for her. Integumentary (Hair, Skin) Wound #1 status is Open. Original cause of wound was Radiation Burn. The date acquired was: 07/26/2020. The wound has  been in treatment 96 weeks. The wound is located on the Right Breast (mastectomy site). The wound measures 1.8cm length x 2.6cm width x 0.4cm depth; 3.676cm^2 area and 1.47cm^3 volume. There is Fat Layer (Subcutaneous Tissue) exposed. There is no tunneling or undermining noted. There is a medium amount of serosanguineous drainage noted. The wound margin is distinct with the outline attached to the wound base. There is large (67-100%) red, pink granulation within the wound bed. There is a small (1-33%) amount of necrotic tissue within the wound bed including Adherent Slough. 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 A skin graft procedure using a bioengineered skin substitute/cellular or tissue based product was performed by Worthy Keeler, PA with the following instrument(s): Forceps and Scissors. Epicord was applied and secured with cutimed sorbact. 6 sq cm of product was utilized and 0 sq cm was wasted. Post Application, Gauze, Allevyn was applied. A Time Out was conducted at 09:47, prior to the start of the procedure. The procedure was tolerated well. Post procedure Diagnosis Wound #1: Same as Pre-Procedure . Plan Follow-up Appointments: Return Appointment in 1 week. - 07/02/22 @ 8:45am with Orland Jarred, RN (Room 7) Anesthetic: (In clinic) Topical Lidocaine 5% applied to wound bed (In clinic) Topical Lidocaine 4% applied to wound bed Cellular or Tissue Based Products: Cellular or Tissue Based Product Type: - Order Epicord for application on 04/12/15=010% covered Cellular or Tissue Based Product applied to wound bed, secured with steri-strips, cover with Adaptic or Mepitel. (DO NOT REMOVE). - Epicord: #1 06/11/22, #2 06/18/22, #3 06/25/22 Bathing/ Shower/ Hygiene: May shower with protection but do not get wound dressing(s) wet. Additional Orders / Instructions: Follow Nutritious Diet WOUND #1: - Breast (mastectomy site) Wound Laterality: Right Cleanser: Soap and Water 1 x Per Day/30 Days Discharge Instructions: May shower and wash wound with dial antibacterial soap and water prior to dressing change. Peri-Wound Care: Skin Prep (Generic) 1 x Per Day/30 Days Discharge Instructions: Use skin prep as directed Prim Dressing: Cutimed Sorbact Swab 1 x Per Day/30 Days ary Discharge  Instructions: Apply to wound bed as instructed Secondary Dressing: ALLEVYN Gentle Border, 5x5 (in/in) 1 x Per Day/30 Days Discharge Instructions: Apply over primary dressing as directed. Secondary Dressing: Woven Gauze Sponges 2x2 in (Generic) 1 x Per Day/30 Days Discharge Instructions: Apply over primary dressing, fold in corners to fill the space 1. I would recommend currently that we go ahead and continue with the recommendation for wound care measures as before and the patient is in agreement with plan. This includes the use of the EpiCord which was reapplied today by myself. 2. I am also can recommend that we have the patient continue with the Sorbact packed and behind followed by the Allevyn border foam dressing. 3. I am also going to suggest that the patient should continue to monitor for any signs of infection if anything changes she should let me know but she did very well with the weight we put this on last week she was able to keep it on for the entirety of the week. We will see patient back for reevaluation in 1 week here in the clinic. If anything worsens or changes patient will contact our office for additional  recommendations. Electronic Signature(s) Signed: 06/25/2022 10:06:00 AM By: Worthy Keeler PA-C Entered By: Worthy Keeler on 06/25/2022 10:06:00 -------------------------------------------------------------------------------- SuperBill Details Patient Name: Date of Service: [name], [name] 06/25/2022 Medical Record Number: [id number] Patient Account Number: [id number] Date of Birth/Sex: Treating RN: 08-16-1951 (71 y.o. [name] Primary Care Provider: [name] Other Clinician: Referring Provider: Treating Provider/Extender: [name], [name]: 96 Diagnosis Coding ICD-10 Codes Code Description [phone] Non-pressure chronic ulcer of skin of other sites with fat layer exposed L58.9 Radiodermatitis, unspecified L59.8  Other specified disorders of the skin and subcutaneous tissue related to radiation I10 Essential (primary) hypertension Facility Procedures The patient participates with Medicare or their insurance follows the Medicare Facility Guidelines: CPT4 Code Description Modifier Quantity [id number] [phone] Epicord 2cm x 3cm - per sqcm 6 The patient participates with Medicare or their insurance follows the Medicare Facility Guidelines: [id number] - SKIN SUB GRAFT TRNK/ARM/LEG 1 ICD-10 Diagnosis Description L98.492 Non-pressure chronic ulcer of skin of other sites with fat layer  exposed Physician Procedures : CPT4 Code Description Modifier [id number] - WC PHYS SKIN SUB GRAFT TRNK/ARM/LEG ICD-10 Diagnosis Description L98.492 Non-pressure chronic ulcer of skin of other sites with fat layer exposed Quantity: 1 Electronic Signature(s) Signed: 06/25/2022 10:18:15 AM By: Worthy Keeler PA-C Entered By: Worthy Keeler on 06/25/2022 10:18:14

## 2022-06-25 NOTE — Progress Notes (Signed)
JAQUAY, MORNEAULT ([id number]) Visit Report for 06/25/2022 Arrival Information Details Patient Name: Date of Service: [name] 06/25/2022 9:30 A M Medical Record Number: [id number] Patient Account Number: [id number] Date of Birth/Sex: Treating RN: 1951-09-14 (71 y.o. [name] Primary Care Hayden Mabin: [name] Other Clinician: Referring Haydin Calandra: Treating Jacki Couse/Extender: [name], [name]: 96 Visit Information History Since Last Visit Added or deleted any medications: No Patient Arrived: Ambulatory Any new allergies or adverse reactions: No Arrival Time: 09:16 Had a fall or experienced change in No Transfer Assistance: None activities of daily living that may affect Patient Identification Verified: Yes risk of falls: Secondary Verification Process Completed: Yes Signs or symptoms of abuse/neglect since last visito No Patient Requires Transmission-Based Precautions: No Hospitalized since last visit: No Patient Has Alerts: No Implantable device outside of the clinic excluding No cellular tissue based products placed in the center since last visit: Has Dressing in Place as Prescribed: Yes Pain Present Now: No Electronic Signature(s) Signed: 06/25/2022 5:04:50 PM By: Lorrin Jackson Entered By: Lorrin Jackson on 06/25/2022 09:17:01 -------------------------------------------------------------------------------- Encounter Discharge Information Details Patient Name: Date of Service: [name], [name] 06/25/2022 9:30 A M Medical Record Number: [id number] Patient Account Number: [id number] Date of Birth/Sex: Treating RN: 04-23-1951 (71 y.o. [name] Primary Care Tayquan Gassman: [name] Other Clinician: Referring Kaled Allende: Treating Johnette Teigen/Extender: [name], [name]: 96 Encounter Discharge Information Items Post Procedure Vitals Discharge Condition: Stable Temperature (F): 97.7 Ambulatory  Status: Ambulatory Pulse (bpm): 77 Discharge Destination: Home Respiratory Rate (breaths/min): 18 Transportation: Private Auto Blood Pressure (mmHg): 172/72 Schedule Follow-up Appointment: Yes Clinical Summary of Care: Provided on 06/25/2022 Form Type Recipient Paper Patient Patient Electronic Signature(s) Signed: 06/25/2022 5:04:50 PM By: Lorrin Jackson Entered By: Lorrin Jackson on 06/25/2022 10:00:16 -------------------------------------------------------------------------------- Lower Extremity Assessment Details Patient Name: Date of Service: [name], [name] 06/25/2022 9:30 A M Medical Record Number: [id number] Patient Account Number: [id number] Date of Birth/Sex: Treating RN: August 27, 1951 (71 y.o. [name] Primary Care Oliviana Mcgahee: [name] Other Clinician: Referring Jemmie Rhinehart: Treating Caroleena Paolini/Extender: [name], [name]: 96 Electronic Signature(s) Signed: 06/25/2022 5:04:50 PM By: Lorrin Jackson Entered By: Lorrin Jackson on 06/25/2022 09:22:14 -------------------------------------------------------------------------------- Multi-Disciplinary Care Plan Details Patient Name: Date of Service: [name], [name] 06/25/2022 9:30 A M Medical Record Number: [id number] Patient Account Number: [id number] Date of Birth/Sex: Treating RN: 03/28/1951 (71 y.o. [name] Primary Care Dameian Crisman: [name] Other Clinician: Referring Thor Nannini: Treating Jamille Fisher/Extender: [name], [name] in Treatment: 96 [place] reviewed with physician Active Inactive Necrotic Tissue Nursing Diagnoses: Knowledge deficit related to management of necrotic/devitalized tissue Goals: Necrotic/devitalized tissue will be minimized in the wound bed Date Initiated: 07/10/2021 Target Resolution Date: 07/09/2022 Goal Status: Active Interventions: Provide education on necrotic tissue and debridement  process Treatment Activities: Excisional debridement : 07/10/2021 Notes: 01/01/22: Continues to use Santyl 06/11/22: Wound cleaner Wound/Skin Impairment Nursing Diagnoses: Impaired tissue integrity Knowledge deficit related to ulceration/compromised skin integrity Goals: Patient/caregiver will verbalize understanding of skin care regimen Date Initiated: 08/22/2020 Target Resolution Date: 07/09/2022 Goal Status: Active Ulcer/skin breakdown will have a volume reduction of 30% by week 4 Date Initiated: 08/22/2020 Date Inactivated: 10/03/2020 Target Resolution Date: 09/19/2020 Goal Status: Met Ulcer/skin breakdown will have a volume reduction of 50% by week 8 Date Initiated: 06/05/2021 Date Inactivated: 07/17/2021 Target Resolution Date: 07/03/2021 Goal Status: Unmet Unmet Reason: infection Interventions: Assess patient/caregiver ability to obtain necessary supplies Assess  patient/caregiver ability to perform ulcer/skin care regimen upon admission and as needed Assess ulceration(s) every visit Treatment Activities: Skin care regimen initiated : 08/22/2020 Topical wound management initiated : 08/22/2020 Notes: 06/05/21: Wound care regimen ongoing. 06/11/22: Wound care regimen continues, applying skin sub ([place]) Electronic Signature(s) Signed: 06/25/2022 5:04:50 PM By: Lorrin Jackson Entered By: Lorrin Jackson on 06/25/2022 09:16:15 -------------------------------------------------------------------------------- Pain Assessment Details Patient Name: Date of Service: [name], [name] 06/25/2022 9:30 A M Medical Record Number: [id number] Patient Account Number: [id number] Date of Birth/Sex: Treating RN: 1951/04/02 (71 y.o. [name] Primary Care Linnae Rasool: [name] Other Clinician: Referring Benjamen Koelling: Treating Erie Sica/Extender: [name], [name]: 96 Active Problems Location of Pain Severity and Description of Pain Patient Has Paino  No Site Locations Pain Management and Medication Current Pain Management: Electronic Signature(s) Signed: 06/25/2022 5:04:50 PM By: Lorrin Jackson Entered By: Lorrin Jackson on 06/25/2022 09:22:08 -------------------------------------------------------------------------------- Patient/Caregiver Education Details Patient Name: Date of Service: [name], [name] 9/13/2023andnbsp9:30 A M Medical Record Number: [id number] Patient Account Number: [id number] Date of Birth/Gender: Treating RN: 02-Sep-1951 (71 y.o. [name] Primary Care Physician: [name] Other Clinician: Referring Physician: Treating Physician/Extender: [name], [name] in Treatment: 56 Education Assessment Education Provided To: Patient Education Topics Provided Wound/Skin Impairment: Methods: Demonstration, Explain/Verbal, Printed Responses: State content correctly Electronic Signature(s) Signed: 06/25/2022 5:04:50 PM By: Lorrin Jackson Entered By: Lorrin Jackson on 06/25/2022 09:16:35 -------------------------------------------------------------------------------- Wound Assessment Details Patient Name: Date of Service: [name], [name] 06/25/2022 9:30 A M Medical Record Number: [id number] Patient Account Number: [id number] Date of Birth/Sex: Treating RN: [birth year] (71 y.o. [name] Primary Care Yan Okray: [name] Other Clinician: Referring Alyha Marines: Treating Kiley Solimine/Extender: [name], [name]: 96 Wound Status Wound Number: 1 Primary Soft Tissue Radionecrosis Etiology: Wound Location: Right Breast (mastectomy site) Wound Open Wounding Event: Radiation Burn Status: Date Acquired: 07/26/2020 Comorbid Anemia, Lymphedema, Deep Vein Thrombosis, Hypertension, Weeks Of Treatment: 96 History: Peripheral Venous Disease, Osteoarthritis, Received Clustered Wound: No Chemotherapy, Received Radiation, Confinement  Anxiety Photos Wound Measurements Length: (cm) 1.8 Width: (cm) 2.6 Depth: (cm) 0.4 Area: (cm) 3.676 Volume: (cm) 1.47 % Reduction in Area: -139.9% % Reduction in Volume: -860.8% Epithelialization: None Tunneling: No Undermining: No Wound Description Classification: Full Thickness Without Exposed Support Structures Wound Margin: Distinct, outline attached Exudate Amount: Medium Exudate Type: Serosanguineous Exudate Color: red, brown Foul Odor After Cleansing: No Slough/Fibrino Yes Wound Bed Granulation Amount: Large (67-100%) Exposed Structure Granulation Quality: Red, Pink Fascia Exposed: No Necrotic Amount: Small (1-33%) Fat Layer (Subcutaneous Tissue) Exposed: Yes Necrotic Quality: Adherent Slough Tendon Exposed: No Muscle Exposed: No Joint Exposed: No Bone Exposed: No Treatment Notes Wound #1 (Breast (mastectomy site)) Wound Laterality: Right Cleanser Soap and Water Discharge Instruction: May shower and wash wound with dial antibacterial soap and water prior to dressing change. Peri-Wound Care Skin Prep Discharge Instruction: Use skin prep as directed Topical Primary Dressing Cutimed Sorbact Swab Discharge Instruction: Apply to wound bed as instructed Secondary Dressing ALLEVYN Gentle Border, 5x5 (in/in) Discharge Instruction: Apply over primary dressing as directed. Woven Gauze Sponges 2x2 in Discharge Instruction: Apply over primary dressing, fold in corners to fill the space Secured With Compression Wrap Compression Stockings Add-Ons Electronic Signature(s) Signed: 06/25/2022 5:04:50 PM By: Lorrin Jackson Entered By: Lorrin Jackson on 06/25/2022 37:10:62 -------------------------------------------------------------------------------- Vitals Details Patient Name: Date of Service: [name], [name] 06/25/2022 9:30 A M Medical Record Number: [id number] Patient Account Number: [id number] Date of Birth/Sex:  Treating RN: [birth year] (71 y.o. [name] Primary Care Quiara Killian: [name] Other Clinician: Referring Zahra Peffley: Treating Shresta Risden/Extender: [name], [name]: 96 Vital Signs Time Taken: 09:20 Temperature (F): 97.7 Height (in): 63 Pulse (bpm): 77 Weight (lbs): 176 Respiratory Rate (breaths/min): 18 Body Mass Index (BMI): 31.2 Blood Pressure (mmHg): 172/72 Reference Range: 80 - 120 mg / dl Electronic Signature(s) Signed: 06/25/2022 5:04:50 PM By: Lorrin Jackson Entered By: Lorrin Jackson on 06/25/2022 09:22:02

## 2022-06-27 NOTE — Progress Notes (Signed)
Office Visit Note  Patient: [name]             Date of Birth: December 04, 1950           MRN: [id number]             PCP: [name], MD Referring: [name], MD Visit Date: 07/10/2022 Occupation: '@GUAROCC'$ @  Subjective:  Pain in multiple joints and positive rheumatoid factor  History of Present Illness: [name] is a 71 y.o. female in consultation per request of her PCP for the evaluation of positive rheumatoid factor and joint pain.  According to the patient her symptoms started after her last COVID 19 booster in 2022.  She states she started having left arm pain and difficulty raising her left arm.  She states she had physical therapy for 2 months.  She states gradually the pain moved to her right shoulder.  The shoulder joint pain eventually got better and her knee joints started hurting in September 2022.  She states she has ongoing pain in her knee joint since then.  Her right knee joint hurts more than her left knee.  Gradually she started having pain in other joints.  She complains of pain and discomfort in her cervical spine, lumbar spine, bilateral wrist joints, bilateral hands, bilateral ankles and her feet.  She had bilateral total hip replacement in the past which is still continue to hurt.  She has noticed some swelling in her ankles and her feet none of the other joints are swollen.  Patient states that she has had x-rays of her left shoulder, right knee and her cervical spine and was told that she has arthritis.  There is family history of rheumatoid arthritis in her mother.  She is gravida 2, para 2, miscarriages 0.  Is no personal or family history of psoriasis.  Activities of Daily Living:  Patient reports morning stiffness for 24 hours.   Patient Reports nocturnal pain.  Difficulty dressing/grooming: Reports Difficulty climbing stairs: Reports Difficulty getting out of chair: Reports Difficulty using hands for taps, buttons, cutlery, and/or writing:  Reports  Review of Systems  Constitutional:  Positive for fatigue.  HENT:  Positive for mouth dryness. Negative for mouth sores.   Eyes:  Negative for dryness.  Respiratory:  Negative for shortness of breath.   Cardiovascular:  Negative for chest pain and palpitations.  Gastrointestinal:  Negative for blood in stool, constipation and diarrhea.  Endocrine: Positive for increased urination.  Genitourinary:  Positive for involuntary urination.  Musculoskeletal:  Positive for joint pain, gait problem, joint pain, joint swelling, myalgias, muscle weakness, morning stiffness, muscle tenderness and myalgias.  Skin:  Positive for color change. Negative for rash, hair loss and sensitivity to sunlight.  Allergic/Immunologic: Negative for susceptible to infections.  Neurological:  Positive for dizziness. Negative for headaches.  Hematological:  Negative for swollen glands.  Psychiatric/Behavioral:  Positive for sleep disturbance. Negative for depressed mood. The patient is not nervous/anxious.     PMFS History:  Patient Active Problem List   Diagnosis Date Noted   Erythropoietin deficiency anemia 02/13/2022   IDA (iron deficiency anemia) 02/01/2018   Lymphedema of right arm 08/24/2015   Breast cancer, right breast ([place]) 06/23/2011    Past Medical History:  Diagnosis Date   Anemia    Arthritis    Blood transfusion    Cancer ([place]) 2012   right breast   DVT (deep venous thrombosis) ([facility])    Erythropoietin deficiency anemia 02/13/2022   Hyperlipidemia  Hypertension    Leg swelling     Family History  Problem Relation Age of Onset   Cervical cancer Mother    Diabetes Mother    Hypertension Mother    Hypertension Father    Cancer Father 65       lung cancer    Heart attack Father    Lung cancer Father    Parkinson's disease Sister    Hypertension Brother    Diabetes Brother    Heart disease Brother    Parkinson's disease Brother    Hypertension Brother    Diabetes Brother     Prostate cancer Brother    Hypertension Brother    Heart disease Brother    Diabetes Brother    Hypertension Brother    Breast cancer Neg Hx    Past Surgical History:  Procedure Laterality Date   BREAST SURGERY  10/13/1996   chest wall exploratory  07/01/2011   CHOLECYSTECTOMY     HIP ARTHROPLASTY     JOINT REPLACEMENT  04/04/2011   hip replacement    MASTECTOMY Right 2012   TUBAL LIGATION     Social History   Social History Narrative   Not on file   Immunization History  Administered Date(s) Administered   Influenza,inj,Quad PF,6+ Mos 08/07/2014, 07/12/2019   Influenza-Unspecified 07/24/2015     Objective: Vital Signs: BP (!) 163/79 (BP Location: Left Arm, Patient Position: Sitting, Cuff Size: Normal)   Pulse 76   Resp 15   Ht 5' 3.5" (1.613 m)   Wt 193 lb (87.5 kg)   BMI 33.65 kg/m    Physical Exam Vitals and nursing note reviewed.  Constitutional:      Appearance: She is well-developed.  HENT:     Head: Normocephalic and atraumatic.  Eyes:     Conjunctiva/sclera: Conjunctivae normal.  Cardiovascular:     Rate and Rhythm: Normal rate and regular rhythm.     Heart sounds: Normal heart sounds.  Pulmonary:     Effort: Pulmonary effort is normal.     Breath sounds: Normal breath sounds.  Abdominal:     General: Bowel sounds are normal.     Palpations: Abdomen is soft.  Musculoskeletal:     Cervical back: Normal range of motion.     Right lower leg: Edema present.     Left lower leg: Edema present.  Lymphadenopathy:     Cervical: No cervical adenopathy.  Skin:    General: Skin is warm and dry.     Capillary Refill: Capillary refill takes less than 2 seconds.  Neurological:     Mental Status: She is alert and oriented to person, place, and time.  Psychiatric:        Behavior: Behavior normal.      Musculoskeletal Exam: C-spine was in her limited range of motion.  She had discomfort range of motion of her lumbar spine.  She had discomfort range of  motion of her shoulder joints.  Elbow joints and wrist joints with good range of motion.  There was no synovitis over MCP joints.  She had bilateral PIP and DIP thickening and subluxation of some of the DIP joints.  Hip joints were replaced and they were not limited range of motion.  She had warmth on palpation of her right knee joint and also thickening of the right knee joint.  She had painful range of motion of the right knee joint.  Left knee joint was in good range of motion.  She had no  tenderness over her ankles.  There was no tenderness over MTPs or PIPs or DIPs.  No synovitis was noted.  CDAI Exam: CDAI Score: -- Patient Global: --; Provider Global: -- Swollen: --; Tender: -- Joint Exam 07/10/2022   No joint exam has been documented for this visit   There is currently no information documented on the homunculus. Go to the Rheumatology activity and complete the homunculus joint exam.  Investigation: No additional findings.  Imaging: No results found.  Recent Labs: Lab Results  Component Value Date   WBC 6.2 05/29/2022   HGB 11.0 (L) 05/29/2022   PLT 189 05/29/2022   NA 142 05/29/2022   K 3.7 05/29/2022   CL 106 05/29/2022   CO2 29 05/29/2022   GLUCOSE 106 (H) 05/29/2022   BUN 18 05/29/2022   CREATININE 0.68 05/29/2022   BILITOT 0.6 05/29/2022   ALKPHOS 65 05/29/2022   AST 13 (L) 05/29/2022   ALT 12 05/29/2022   PROT 7.6 05/29/2022   ALBUMIN 4.1 05/29/2022   CALCIUM 9.3 05/29/2022   GFRAA >60 05/17/2020    Speciality Comments: No specialty comments available.  Procedures:  No procedures performed Allergies: Contrast media [iodinated contrast media], Metrizamide, Other, Dye fdc blue [brilliant blue fcf (fd&c blue #1)], and Fd&c blue #1 (brilliant blue fcf)   Assessment / Plan:     Visit Diagnoses: Polyarthralgia-she complains of pain and discomfort in multiple joints.  The pain started after the COVID-19 booster.  Rheumatoid factor positive - RF 434.8 on  04/29/21 -she is significantly high rheumatoid factor.  I will repeat rheumatoid factor and also hep panel.  Plan: Hepatitis B surface antigen, Hepatitis C antibody, Hepatitis B core antibody, IgM  Chronic left shoulder pain-she complains of pain and discomfort in the left shoulder joint.  She states she had left shoulder joint x-rays by the orthopedic surgeon and was told she had arthritis.  Pain in both hands -she complains of pain and discomfort in her bilateral hands.  Bilateral PIP and DIP thickening and subluxation of some of the DIP joints was noted.  No synovitis was noted over MCPs or wrist joints.  I will obtain x-rays and labs today.  Plan: XR Hand 2 View Right, XR Hand 2 View Left.  X-rays showed severe osteoarthritis.  Intercarpal and radiocarpal joint space narrowing was also noted which raises concern of inflammatory arthritis most likely rheumatoid arthritis.  Chronic pain of both knees -she informed on palpation of her right knee joint.  Patient states she had a recent fall about 2 weeks ago.  She also has chronic pain and discomfort in her right knee joint for several years.  She has difficulty walking due to ongoing right knee joint pain.  Plan: XR KNEE 3 VIEW RIGHT, severe chondromalacia patella was noted.  Moderate to severe osteoarthritis was noted.  Sedimentation rate, Uric acid, Rheumatoid factor, Cyclic citrul peptide antibody, IgG, Glucose 6 phosphate dehydrogenase  Pain in both feet -she complains of discomfort in her bilateral feet.  No warmth swelling or effusion was noted.  She had bilateral pedal edema.  Plan: XR Foot 2 Views Right, XR Foot 2 Views Left.  Severe osteoarthritis was noted in bilateral feet.  Left fifth MTP erosive changes were noted.  Status post bilateral total hip replacement -she continues to have discomfort in her hip joints.  Left total hip replacement 2012 and right total hip replacement 2019.  Malignant neoplasm of nipple of right breast in female,  unspecified estrogen receptor status ([place]) -  1998, recurrence 04/2001 and 2012, that is post right mastectomy, chemotherapy and radiation therapy.  Other medical problems are listed as follows:  Lymphedema of right arm  Erythropoietin deficiency anemia  Iron deficiency anemia due to chronic blood loss  Essential hypertension  History of hyperlipidemia  History of gastroesophageal reflux (GERD)  History of DVT (deep vein thrombosis) - after mastectomy   History of anxiety  Family history of rheumatoid arthritis- mother  Orders: Orders Placed This Encounter  Procedures   XR Hand 2 View Right   XR Hand 2 View Left   XR Foot 2 Views Right   XR Foot 2 Views Left   XR KNEE 3 VIEW RIGHT   Sedimentation rate   Uric acid   Rheumatoid factor   Cyclic citrul peptide antibody, IgG   Hepatitis B surface antigen   Hepatitis C antibody   Hepatitis B core antibody, IgM   Glucose 6 phosphate dehydrogenase   No orders of the defined types were placed in this encounter.  .  Follow-Up Instructions: Return for arthralgia, positive rheumatoid factor.   Bo Merino, MD  Note - This record has been created using Editor, commissioning.  Chart creation errors have been sought, but may not always  have been located. Such creation errors do not reflect on  the standard of medical care.

## 2022-07-02 ENCOUNTER — Encounter (HOSPITAL_BASED_OUTPATIENT_CLINIC_OR_DEPARTMENT_OTHER): Payer: Medicare Other | Admitting: Physician Assistant

## 2022-07-02 DIAGNOSIS — I890 Lymphedema, not elsewhere classified: Secondary | ICD-10-CM | POA: Diagnosis not present

## 2022-07-02 DIAGNOSIS — Z86718 Personal history of other venous thrombosis and embolism: Secondary | ICD-10-CM | POA: Diagnosis not present

## 2022-07-02 DIAGNOSIS — L98492 Non-pressure chronic ulcer of skin of other sites with fat layer exposed: Secondary | ICD-10-CM | POA: Diagnosis not present

## 2022-07-02 DIAGNOSIS — I10 Essential (primary) hypertension: Secondary | ICD-10-CM | POA: Diagnosis not present

## 2022-07-02 DIAGNOSIS — L598 Other specified disorders of the skin and subcutaneous tissue related to radiation: Secondary | ICD-10-CM | POA: Diagnosis not present

## 2022-07-02 DIAGNOSIS — S21001A Unspecified open wound of right breast, initial encounter: Secondary | ICD-10-CM | POA: Diagnosis not present

## 2022-07-02 DIAGNOSIS — L589 Radiodermatitis, unspecified: Secondary | ICD-10-CM | POA: Diagnosis not present

## 2022-07-02 NOTE — Progress Notes (Addendum)
[name], [name] ([id number]) Visit Report for 07/02/2022 Chief Complaint Document Details Patient Name: Date of Service: [name] YE 07/02/2022 8:45 A M Medical Record Number: [id number] Patient Account Number: [id number] Date of Birth/Sex: Treating RN: 07-02-1951 (71 y.o. 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 [name] Primary Care Provider: [name] Other Clinician: Referring Provider: Treating Provider/Extender: [name], [name]: 97 Cellular or Tissue Based Product Type Wound #1 Right Breast (mastectomy site) Applied to: Performed By: Physician Worthy Keeler, PA Cellular or Tissue Based Product Type: Epicord Level of Consciousness (Pre-procedure): Awake and Alert Pre-procedure Verification/Time Out Yes - 09:30 Taken: Location: trunk / arms / legs Wound Size (sq cm): 5.44 Product Size (sq cm): 6 Waste Size (sq cm): 0 Amount of Product Applied (sq cm): 6 Instrument Used: Forceps Lot #: [id number] Order #: [id number] Expiration Date: 02/11/2027 Fenestrated: Yes Instrument: Blade Reconstituted: Yes Solution Type: normal saline Solution Amount: 21m Lot #: [id number] Expiration Date: 07/13/2022 Secured: No Dressing Applied: Yes Primary Dressing:  sorbact Procedural Pain: 0 Post Procedural Pain: 0 Response to Treatment: Procedure was tolerated well Level of Consciousness (Post- Awake and Alert procedure): Post Procedure Diagnosis Same as Pre-procedure Electronic Signature(s) Signed: 07/02/2022 5:51:27 PM By: SWorthy KeelerPA-C Signed: 07/04/2022 10:29:43 AM By: PSharyn CreamerRN, BSN Entered By: PSharyn Creameron 07/02/2022 09:42:49 -------------------------------------------------------------------------------- Debridement Details Patient Name: Date of Service: [name], [name] 07/02/2022 8:45 A M Medical Record Number: [id number] Account Number: [id number] of Birth/Sex: Treating RN: 2Apr 22, 1952((71y.o. FDonalda EwingsPrimary Care Provider: HMaryella ShiversOther Clinician: Referring Provider: Treating Provider/Extender: SZenon Mayo FAlabamaWeeks in Treatment: 97 Debridement Performed for Assessment: Wound #1 Right Breast (mastectomy site) Performed By: Physician SWorthy Keeler PA Debridement Type: Debridement Level of Consciousness (Pre-procedure): Awake and Alert Pre-procedure Verification/Time Out Yes - 09:30 Taken: Start Time: 09:34 Pain Control: Lidocaine 5% topical ointment T Area Debrided (L x W): otal 1.7 (cm) x 3.2 (cm) = 5.44 (cm) Tissue and other material debrided: Non-Viable, Slough, Subcutaneous, Biofilm, Slough Level: Skin/Subcutaneous Tissue Debridement Description: Excisional Instrument: Curette Bleeding: Minimum Hemostasis Achieved: Pressure Procedural Pain: 0 Post Procedural Pain: 0 Response to Treatment: Procedure was tolerated well Level of Consciousness (Post- Awake and Alert procedure): Post Debridement Measurements of Total Wound Length: (cm) 1.7 Width: (cm) 3.2 Depth: (cm) 0.4 Volume: (cm) 1.709 Character of Wound/Ulcer Post Debridement: Improved Post Procedure Diagnosis Same as Pre-procedure Electronic Signature(s) Signed: 07/02/2022 5:51:27 PM By:  SWorthy KeelerPA-C Signed: 07/04/2022 10:29:43 AM By: PSharyn CreamerRN, BSN Entered By: PSharyn Creameron 07/02/2022 09:36:19 -------------------------------------------------------------------------------- HPI Details Patient Name: Date of Service: [name], [name] 07/02/2022 8:45 A M Medical Record Number: [id number] Account Number: [id number] of Birth/Sex: Treating RN: [birth year]((71y.o. F) Primary Care Provider: HMaryella ShiversOther Clinician: Referring Provider: Treating Provider/Extender: SZenon Mayo FAlabamaWeeks in Treatment: 92History of Present Illness HPI Description: 08/22/2020 upon evaluation today patient actually appears  to be doing somewhat poorly in regard to her mastectomy site on the right chest wall. She had a mastectomy initially in 1998. She had chemotherapy only no radiation following. In 2002 she subsequently did undergo radiation for the first time and then subsequently in 2012 had repeat radiation after having had a finding that was consistent with a relapse of the cancer. 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 She also has radiation damage to the skin over the right chest wall where she had the mastectomy. She was referred to [place] by Dr. Matthew Saras in [facility] and her oncologist is Dr. Burney Gauze. Subsequently I want to make sure before we delve into this more deeply that the patient does not have any issues with a return of cancer that needs to be managed by them. 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 Nonetheless she does not remember exactly when her last PET scan was but it was not too recently she tells me. 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 The patient does have hypertension as well. She sees her  oncologist next on December 6. 09/12/2020 on evaluation today patient actually appears to be doing excellent in regard to her wound under the right breast location. Currently this is measuring smaller in general seems to be doing very well. She tells me that the collagen is doing a good job and that the dressing that she has been putting on is not causing any troubles as far pulling on her skin or scar tissue is concerned all of which is excellent news. 10/03/2020 upon evaluation today patient appears to be doing well with regard to her surgical site from her mastectomy on the right breast region. She tells me that she has had very little bleeding nothing seems to be sticking badly and in general she has been extremely pleased with where things stand today. No fevers, chills, nausea, vomiting, or diarrhea. 10/24/2020 upon evaluation today patient actually appears to be doing excellent in regard to her wound. There is just a very small area still open and to be honest there was minimal slough noted on the surface of the wound nothing that requires sharp debridement. In general I feel like she is actually doing quite well and overall I am pleased. I do think we may switch to silver alginate dressing and also contemplating using a AandE ointment in order to help keep everything moist and the scar tissue region while the alginate keeps it dry enough to heal 11/14/2020 upon evaluation today patient appears to be doing well at this point in regard to her wound. I do feel like that she is making good progress again this is a very difficult region over the surgical site of the right chest wall at the site of mastectomy. Nonetheless I think that we are just a very small area still open I think alginate is doing a good job helping to dry this up and I would recommend this such that we continue with this. 01/02/2021 on evaluation today patient's wound actually appears to be doing decently well. There does not appear  to be any signs of active infection which is great news. She does have some slight slough buildup with biofilm on the surface of the wound mainly more biofilm. Nonetheless I was  able to gently remove this with saline and gauze as well as a sterile Q-tip. She tolerated that today without complication. 01/30/2021 upon evaluation today patient appears to be doing well with regard to her wound although she still has quite a bit of trouble getting this to close I think the scar tissue and radiation damage is quite significant here unfortunately. There does not appear to be any signs of active infection which is great news. No fevers, chills, nausea, vomiting, or diarrhea. 02/27/2021 upon evaluation today patient appears to be doing excellent in regard to her wound. She has been tolerating the dressing changes without complication and in general I am extremely pleased with where things stand today. There does not appear to be any signs of infection which is great news. No fevers, chills, nausea, vomiting, or diarrhea. With that being said I do think that she still developing some slough buildup. I did discuss with her today the possibility of proceeding with HBO therapy. We have had this discussion before but she really is not quite committed to wanting to do that much and spend that much time in the chamber. She wants to still think about it. 03/27/2021 upon evaluation today patient appears to be doing about the same in regard to her wound. She still developing a lot of slough buildup on the surface of the wound. I did try to clean that away to some degree today. She tolerated that without any pain or complication. Subsequently I am thinking we may want to switch over to [facility] see if this may do better for her. Patient is in agreement with giving that a trial. 05/01/2021 upon evaluation today patient appears to be doing about the same in regard to her wounds. She has been tolerating the dressing changes  without complication. Fortunately there does not appear to be any signs of active infection at this time which is great news. No fevers, chills, nausea, vomiting, or diarrhea.. 06/05/2021 upon evaluation today patient appears to be doing pretty well currently in regard to her wound at the mastectomy site right chest wall. Overall since we switch back to the alginate she tells me things have significantly improved she is much happier with where things stand currently. Fortunately there does not appear to be any signs of active infection which is great news. No fevers, chills, nausea, vomiting, or diarrhea. 07/10/2021 upon evaluation today patient unfortunately does not appear to be doing nearly as well as what she previously was. There does not appear to be any evidence of new epithelial growth she has a lot of necrotic tissue the base of the wound this is much more significant than what we previously noted. She also has been having increased pain and increased drainage. In general I am very concerned about this especially in light of the fact that she does have a history of breast cancer I want to ensure that were not looking at any cancerous type lesion at this point. Otherwise also think she does need some debridement we did do MolecuLight scanning today. 07/17/2021 upon evaluation today patient appears to be doing well with regard to her wound compared to last week although she still having some erythema I am concerned in this regard. I do think that she fortunately had a negative biopsy which is great news unfortunately she did have Pseudomonas noted as a bacteria present in the culture which I think is good and need to be addressed with Levaquin. I Minna send that into the pharmacy today.  We did repeat the MolecuLight screening. 07/31/2021 upon evaluation today patient's wound is actually showing signs of improvement which is great news. Fortunately there does not appear to be any signs of  infection currently locally nor systemically and I am very pleased in that regard. With that being said the patient is having some issues here with continued necrotic tissue I think packing with the Dakin's moistened gauze dressing is still in the right leg ago. 08/14/2021 upon evaluation today patient appears to be doing well with regard to her wound all things considered I do not see any signs of significant infection which is great news. Fortunately there does not appear to be any evidence of infection which is also great news. In general I think that the patient is tolerating the dressing changes well. We been using a Dakin's moistened gauze. 08/28/2021 upon evaluation today patient appears to be doing well with regard to her wound. Worsening signs of definite improvement which is great news and overall very pleased with where we stand today. There is no signs of inflamed tissue or infection which is great as well and I think the Dakin's is doing a great job here. 09/11/2021 upon evaluation today patient appears to be doing well with regard to her wound all things considered. I am can perform some debridement today to clear away some of the necrotic debris with that being said overall I think that she is making decently good progress here which is great news. There does not appear to be any signs of active infection also good news. That in fact is probably the best news in her mind. 09/25/2021 upon evaluation today patient appears to be doing decently well in regard to her wound. Overall I do not see any signs of infection and I think she is doing quite well. I do believe that we are making headway here although is very slow due to the scarring and the depth of the wound this is a significantly deep wound. 10/16/2021 upon evaluation today patient appears to be doing about the same in regard to the wound on her right chest wall. Fortunately there does not appear to be any signs of active infection  locally nor systemically which is great news. With that being said this still was not cleaning up quite as quickly as I would like to see. Fortunately I think that we can definitely give the Santyl another try we tried this in the past and unfortunately did not have a good result but again I think she was infected at that time as well which was part of the issue. Nonetheless I think currently that we will be able to go ahead and see what we can do about getting this little cleaner with the Santyl. 11/06/2021 upon evaluation today patient appears to be doing well with regard to her wound. This actually appears to be a little bit better with the Santyl I am happy in that regard. Fortunately I do not see any signs of active infection at this time. No fevers, chills, nausea, vomiting, or diarrhea. 12/04/2021 upon evaluation patient appears to be doing well with the Santyl at this point. I am very pleased with where we stand and I think that she is making good progress here. Fortunately I do not see any evidence of active infection locally or systemically at this time which is great news. No fevers, chills, nausea, vomiting, or diarrhea. 01/01/2022 upon evaluation today patient actually is making excellent progress with the Santyl. I am  extremely pleased with where we stand today. I do not see any signs of infection. 01-29-2022 upon evaluation today patient appears to be doing well with regard to her wound. This is showing signs of some improvement. It is a little less deep than what it was. Size wise is also slightly smaller but again there still is a significant wound in this area. Fortunately I do not see any evidence of infection and she is doing quite well. 02-26-2022 upon evaluation today patient appears to be doing well with regard to her wound. In fact this is actually the best that I have seen in a very long time. I do not see any evidence of active infection at this time locally or systemically which  is great news and overall I think we are on the right track. Nonetheless I do believe that the patient is continuing to show evidence of improvement with the Santyl week by week. 03-26-2022 upon evaluation today patient appears to be doing well with regard to the wound on her right mastectomy site. She is actually showing signs of excellent improvement in fact there was some bleeding just with a little bit of light cleaning over the area. Fortunately I do not see any signs of active infection locally or systemically at this time which is great news. 04-30-2022 upon evaluation today patient appears to be doing well currently in regard to her wound. She is actually showing some signs of improvement here which is great news. Still this is very very slow. Subsequently I do believe that she may benefit from the use of Keystone topical antibiotics for short amount of time before applying a skin substitute I think EpiCord could be doing very well for her as far as that is concerned. I discussed that with her today. Fortunately I do not see any evidence of active infection locally or systemically at this time which is great news. No fevers, chills, nausea, vomiting, or diarrhea. 05-28-2022 upon evaluation today patient's wound is actually showing signs of significant improvement. Fortunately I do not see any evidence of infection at this time which is great news. No fevers, chills, nausea, vomiting, or diarrhea. With that being said I do believe that the patient is tolerating the dressing changes without complication. We been using the [facility] which I think is helpful. We do have approval for the [place]. 06-11-2022 upon evaluation today patient appears to be doing well with regard to her wound she is actually here today for the first application of [place] which I think is good to be beneficial for her. I do think that we will probably be able to keep this in place without can be the one thing of question to be  honest. 06-18-2022 upon evaluation today patient actually appears to be doing excellent today. She has 1 treatment of the EpiCord underway and the wound surface already looks much better. This will be EpiCord #2 today. Fortunately I think we are on the right track here. 06-25-2022 upon evaluation today patient appears to be making good progress and overall the patient seems to have tolerated EpiCord without any complication. I do feel like that the wound is improving quite significantly. This is EpiCord #3 today. 07-02-2021 upon evaluation today patient's wound is actually showing signs of improvement. I am actually very pleased with where we stand we have been seeing some improvements in size overall and I think that we are still in the right track although there is a little bit of need for sharp  debridement today to clearway some of the necrotic debris and remaining EpiCord which is actually still somewhat adhered to the wound bed. Overall I am extremely pleased though with where we stand. This is EpiCord #4 today. Electronic Signature(s) Signed: 07/04/2022 1:45:30 PM By: Worthy Keeler PA-C Entered By: Worthy Keeler on 07/04/2022 13:45:30 -------------------------------------------------------------------------------- Physical Exam Details Patient Name: Date of Service: [name], [name] 07/02/2022 8:45 A M Medical Record Number: [id number] Patient Account Number: [id number] Date of Birth/Sex: Treating RN: 02/13/1951 (71 y.o. F) Primary Care Provider: [name] Other Clinician: Referring Provider: Treating Provider/Extender: [name], [name]: 73 Constitutional [name] and well-hydrated in no acute distress. Respiratory normal breathing without difficulty. Psychiatric this patient is able to make decisions and demonstrates good insight into disease process. Alert and Oriented x 3. pleasant and cooperative. Notes Upon evaluation I was able to  clearway the necrotic debris including the left over EpiCord as well as any slough and biofilm. She had minimal bleeding noted I was able to achieve hemostasis and once hemostasis was achieved I then apply the new EpiCord and at this point. She actually tolerated this process without complication I then used the Sorbact to pack it behind and were just using gauze and the bordered foam dressing to cover she keeps this on for the week and that seems to have done extremely well. Electronic Signature(s) Signed: 07/04/2022 1:46:11 PM By: Worthy Keeler PA-C Entered By: Worthy Keeler on 07/04/2022 13:46:10 -------------------------------------------------------------------------------- Physician Orders Details Patient Name: Date of Service: [name], [name] 07/02/2022 8:45 A M Medical Record Number: [id number] Patient Account Number: [id number] Date of Birth/Sex: Treating RN: May 22, 1951 (71 y.o. [name] Primary Care Provider: [name] Other Clinician: Referring Provider: Treating Provider/Extender: [name], [name]: 86 Verbal / Phone Orders: No Diagnosis Coding ICD-10 Coding Code Description L98.492 Non-pressure chronic ulcer of skin of other sites with fat layer exposed L58.9 Radiodermatitis, unspecified L59.8 Other specified disorders of the skin and subcutaneous tissue related to radiation I10 Essential (primary) hypertension Follow-up Appointments ppointment in 1 week. - 07/09/22 @ 9:30 with Orland Jarred, RN (Room 7) Return A Anesthetic (In clinic) Topical Lidocaine 5% applied to wound bed (In clinic) Topical Lidocaine 4% applied to wound bed Cellular or Tissue Based Products Cellular or Tissue Based Product Type: - Order Epicord for application on 7/51/02=585% covered daptic or Mepitel. (DO NOT REMOVE). - Cellular or Tissue Based Product applied to wound bed, secured with steri-strips, cover with A Epicord #1 06/11/22, #2  06/18/22, #3 06/25/22, #49/20/23 Bathing/ Shower/ Hygiene May shower and wash wound with soap and water. - on days that dressing is changed Additional Orders / Instructions Follow Nutritious Diet Wound Treatment Wound #1 - Breast (mastectomy site) Wound Laterality: Right Cleanser: Soap and Water 1 x Per Day/30 Days Discharge Instructions: May shower and wash wound with dial antibacterial soap and water prior to dressing change. Peri-Wound Care: Skin Prep (Generic) 1 x Per Day/30 Days Discharge Instructions: Use skin prep as directed Prim Dressing: Cutimed Sorbact Swab 1 x Per Day/30 Days ary Discharge Instructions: Apply to wound bed as instructed Secondary Dressing: ALLEVYN Gentle Border, 5x5 (in/in) 1 x Per Day/30 Days Discharge Instructions: Apply over primary dressing as directed. Secondary Dressing: Woven Gauze Sponges 2x2 in (Generic) 1 x Per Day/30 Days Discharge Instructions: Apply over primary dressing, fold in corners to fill the space Patient Medications llergies: Iodinated Contrast Media A Notifications Medication  Indication Start End prior to debridement 07/02/2022 lidocaine DOSE topical 5 % ointment - ointment topical Electronic Signature(s) Signed: 07/02/2022 5:51:27 PM By: Worthy Keeler PA-C Signed: 07/04/2022 10:29:43 AM By: Sharyn Creamer RN, BSN Entered By: Sharyn Creamer on 07/02/2022 09:46:27 -------------------------------------------------------------------------------- Problem List Details Patient Name: Date of Service: [name], [name] 07/02/2022 8:45 A M Medical Record Number: [id number] Patient Account Number: [id number] Date of Birth/Sex: Treating RN: Apr 28, 1951 (71 y.o. F) Primary Care Provider: [name] Other Clinician: Referring Provider: Treating Provider/Extender: [name], [name]: 97 Active Problems ICD-10 Encounter Code Description Active Date MDM Diagnosis L98.492 Non-pressure chronic ulcer of skin  of other sites with fat layer exposed 08/22/2020 No Yes L58.9 Radiodermatitis, unspecified 08/22/2020 No Yes L59.8 Other specified disorders of the skin and subcutaneous tissue related to 08/22/2020 No Yes radiation I10 Essential (primary) hypertension 08/22/2020 No Yes Inactive Problems Resolved Problems Electronic Signature(s) Signed: 07/02/2022 8:43:29 AM By: Worthy Keeler PA-C Entered By: Worthy Keeler on 07/02/2022 08:43:29 -------------------------------------------------------------------------------- Progress Note Details Patient Name: Date of Service: [name], [name] 07/02/2022 8:45 A M Medical Record Number: [id number] Patient Account Number: [id number] Date of Birth/Sex: Treating RN: August 21, 1951 (71 y.o. F) Primary Care Provider: [name] Other Clinician: Referring Provider: Treating Provider/Extender: [name], [name] in Treatment: 97 Subjective Chief Complaint Information obtained from Patient Right Breast soft tissue radionecrosis following mastectomy History of Present Illness (HPI) 08/22/2020 upon evaluation today patient actually appears to be doing somewhat poorly in regard to her mastectomy site on the right chest wall. She had a mastectomy initially in 1998. She had chemotherapy only no radiation following. In 2002 she subsequently did undergo radiation for the first time and then subsequently in 2012 had repeat radiation after having had a finding that was consistent with a relapse of the cancer. 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 She also has radiation damage to the skin over the right chest wall where she had the mastectomy. She was referred to [place] by Dr. Matthew Saras in [facility] and her oncologist is Dr. Burney Gauze. Subsequently I want to make sure before we delve into this more deeply that the patient does not have any issues with a return of cancer that needs to be  managed by them. 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 Nonetheless she does not remember exactly when her last PET scan was but it was not too recently she tells me. 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 The patient does have hypertension as well. She sees her oncologist next on December 6. 09/12/2020 on evaluation today patient actually appears to be doing excellent in regard to her wound under the right breast location. Currently this is measuring smaller in general seems to be doing very well. She tells me that the collagen is doing a good job and that the dressing that she has been putting on is not causing any troubles as far pulling on her skin or scar tissue is concerned all of which is excellent news. 10/03/2020 upon evaluation today patient appears to be doing well with regard to her surgical site from her mastectomy on the right breast region. She tells me that she has had very little bleeding nothing seems to be sticking  badly and in general she has been extremely pleased with where things stand today. No fevers, chills, nausea, vomiting, or diarrhea. 10/24/2020 upon evaluation today patient actually appears to be doing excellent in regard to her wound. There is just a very small area still open and to be honest there was minimal slough noted on the surface of the wound nothing that requires sharp debridement. In general I feel like she is actually doing quite well and overall I am pleased. I do think we may switch to silver alginate dressing and also contemplating using a AandE ointment in order to help keep everything moist and the scar tissue region while the alginate keeps it dry enough to heal 11/14/2020 upon evaluation today patient appears to be doing well at this point in regard to her wound. I do feel like that she  is making good progress again this is a very difficult region over the surgical site of the right chest wall at the site of mastectomy. Nonetheless I think that we are just a very small area still open I think alginate is doing a good job helping to dry this up and I would recommend this such that we continue with this. 01/02/2021 on evaluation today patient's wound actually appears to be doing decently well. There does not appear to be any signs of active infection which is great news. She does have some slight slough buildup with biofilm on the surface of the wound mainly more biofilm. Nonetheless I was able to gently remove this with saline and gauze as well as a sterile Q-tip. She tolerated that today without complication. 01/30/2021 upon evaluation today patient appears to be doing well with regard to her wound although she still has quite a bit of trouble getting this to close I think the scar tissue and radiation damage is quite significant here unfortunately. There does not appear to be any signs of active infection which is great news. No fevers, chills, nausea, vomiting, or diarrhea. 02/27/2021 upon evaluation today patient appears to be doing excellent in regard to her wound. She has been tolerating the dressing changes without complication and in general I am extremely pleased with where things stand today. There does not appear to be any signs of infection which is great news. No fevers, chills, nausea, vomiting, or diarrhea. With that being said I do think that she still developing some slough buildup. I did discuss with her today the possibility of proceeding with HBO therapy. We have had this discussion before but she really is not quite committed to wanting to do that much and spend that much time in the chamber. She wants to still think about it. 03/27/2021 upon evaluation today patient appears to be doing about the same in regard to her wound. She still developing a lot of slough buildup  on the surface of the wound. I did try to clean that away to some degree today. She tolerated that without any pain or complication. Subsequently I am thinking we may want to switch over to [facility] see if this may do better for her. Patient is in agreement with giving that a trial. 05/01/2021 upon evaluation today patient appears to be doing about the same in regard to her wounds. She has been tolerating the dressing changes without complication. Fortunately there does not appear to be any signs of active infection at this time which is great news. No fevers, chills, nausea, vomiting, or diarrhea.. 06/05/2021 upon evaluation today patient appears to be doing pretty  well currently in regard to her wound at the mastectomy site right chest wall. Overall since we switch back to the alginate she tells me things have significantly improved she is much happier with where things stand currently. Fortunately there does not appear to be any signs of active infection which is great news. No fevers, chills, nausea, vomiting, or diarrhea. 07/10/2021 upon evaluation today patient unfortunately does not appear to be doing nearly as well as what she previously was. There does not appear to be any evidence of new epithelial growth she has a lot of necrotic tissue the base of the wound this is much more significant than what we previously noted. She also has been having increased pain and increased drainage. In general I am very concerned about this especially in light of the fact that she does have a history of breast cancer I want to ensure that were not looking at any cancerous type lesion at this point. Otherwise also think she does need some debridement we did do MolecuLight scanning today. 07/17/2021 upon evaluation today patient appears to be doing well with regard to her wound compared to last week although she still having some erythema I am concerned in this regard. I do think that she fortunately had a negative  biopsy which is great news unfortunately she did have Pseudomonas noted as a bacteria present in the culture which I think is good and need to be addressed with Levaquin. I Minna send that into the pharmacy today. We did repeat the MolecuLight screening. 07/31/2021 upon evaluation today patient's wound is actually showing signs of improvement which is great news. Fortunately there does not appear to be any signs of infection currently locally nor systemically and I am very pleased in that regard. With that being said the patient is having some issues here with continued necrotic tissue I think packing with the Dakin's moistened gauze dressing is still in the right leg ago. 08/14/2021 upon evaluation today patient appears to be doing well with regard to her wound all things considered I do not see any signs of significant infection which is great news. Fortunately there does not appear to be any evidence of infection which is also great news. In general I think that the patient is tolerating the dressing changes well. We been using a Dakin's moistened gauze. 08/28/2021 upon evaluation today patient appears to be doing well with regard to her wound. Worsening signs of definite improvement which is great news and overall very pleased with where we stand today. There is no signs of inflamed tissue or infection which is great as well and I think the Dakin's is doing a great job here. 09/11/2021 upon evaluation today patient appears to be doing well with regard to her wound all things considered. I am can perform some debridement today to clear away some of the necrotic debris with that being said overall I think that she is making decently good progress here which is great news. There does not appear to be any signs of active infection also good news. That in fact is probably the best news in her mind. 09/25/2021 upon evaluation today patient appears to be doing decently well in regard to her wound. Overall  I do not see any signs of infection and I think she is doing quite well. I do believe that we are making headway here although is very slow due to the scarring and the depth of the wound this is a significantly deep wound. 10/16/2021 upon  evaluation today patient appears to be doing about the same in regard to the wound on her right chest wall. Fortunately there does not appear to be any signs of active infection locally nor systemically which is great news. With that being said this still was not cleaning up quite as quickly as I would like to see. Fortunately I think that we can definitely give the Santyl another try we tried this in the past and unfortunately did not have a good result but again I think she was infected at that time as well which was part of the issue. Nonetheless I think currently that we will be able to go ahead and see what we can do about getting this little cleaner with the Santyl. 11/06/2021 upon evaluation today patient appears to be doing well with regard to her wound. This actually appears to be a little bit better with the Santyl I am happy in that regard. Fortunately I do not see any signs of active infection at this time. No fevers, chills, nausea, vomiting, or diarrhea. 12/04/2021 upon evaluation patient appears to be doing well with the Santyl at this point. I am very pleased with where we stand and I think that she is making good progress here. Fortunately I do not see any evidence of active infection locally or systemically at this time which is great news. No fevers, chills, nausea, vomiting, or diarrhea. 01/01/2022 upon evaluation today patient actually is making excellent progress with the Santyl. I am extremely pleased with where we stand today. I do not see any signs of infection. 01-29-2022 upon evaluation today patient appears to be doing well with regard to her wound. This is showing signs of some improvement. It is a little less deep than what it was. Size wise  is also slightly smaller but again there still is a significant wound in this area. Fortunately I do not see any evidence of infection and she is doing quite well. 02-26-2022 upon evaluation today patient appears to be doing well with regard to her wound. In fact this is actually the best that I have seen in a very long time. I do not see any evidence of active infection at this time locally or systemically which is great news and overall I think we are on the right track. Nonetheless I do believe that the patient is continuing to show evidence of improvement with the Santyl week by week. 03-26-2022 upon evaluation today patient appears to be doing well with regard to the wound on her right mastectomy site. She is actually showing signs of excellent improvement in fact there was some bleeding just with a little bit of light cleaning over the area. Fortunately I do not see any signs of active infection locally or systemically at this time which is great news. 04-30-2022 upon evaluation today patient appears to be doing well currently in regard to her wound. She is actually showing some signs of improvement here which is great news. Still this is very very slow. Subsequently I do believe that she may benefit from the use of [place] topical antibiotics for short amount of time before applying a skin substitute I think EpiCord could be doing very well for her as far as that is concerned. I discussed that with her today. Fortunately I do not see any evidence of active infection locally or systemically at this time which is great news. No fevers, chills, nausea, vomiting, or diarrhea. 05-28-2022 upon evaluation today patient's wound is actually showing signs  of significant improvement. Fortunately I do not see any evidence of infection at this time which is great news. No fevers, chills, nausea, vomiting, or diarrhea. With that being said I do believe that the patient is tolerating the dressing changes without  complication. We been using the [facility] which I think is helpful. We do have approval for the [place]. 06-11-2022 upon evaluation today patient appears to be doing well with regard to her wound she is actually here today for the first application of [place] which I think is good to be beneficial for her. I do think that we will probably be able to keep this in place without can be the one thing of question to be honest. 06-18-2022 upon evaluation today patient actually appears to be doing excellent today. She has 1 treatment of the EpiCord underway and the wound surface already looks much better. This will be EpiCord #2 today. Fortunately I think we are on the right track here. 06-25-2022 upon evaluation today patient appears to be making good progress and overall the patient seems to have tolerated EpiCord without any complication. I do feel like that the wound is improving quite significantly. This is EpiCord #3 today. 07-02-2021 upon evaluation today patient's wound is actually showing signs of improvement. I am actually very pleased with where we stand we have been seeing some improvements in size overall and I think that we are still in the right track although there is a little bit of need for sharp debridement today to clearway some of the necrotic debris and remaining EpiCord which is actually still somewhat adhered to the wound bed. Overall I am extremely pleased though with where we stand. This is EpiCord #4 today. Objective Constitutional [name] and well-hydrated in no acute distress. Vitals Time Taken: 8:42 AM, Height: 63 in, Weight: 176 lbs, BMI: 31.2, Temperature: 98.1 F, Pulse: 67 bpm, Respiratory Rate: 18 breaths/min, Blood Pressure: 158/81 mmHg. Respiratory normal breathing without difficulty. Psychiatric this patient is able to make decisions and demonstrates good insight into disease process. Alert and Oriented x 3. pleasant and cooperative. General Notes: Upon evaluation  I was able to clearway the necrotic debris including the left over EpiCord as well as any slough and biofilm. She had minimal bleeding noted I was able to achieve hemostasis and once hemostasis was achieved I then apply the new EpiCord and at this point. She actually tolerated this process without complication I then used the Sorbact to pack it behind and were just using gauze and the bordered foam dressing to cover she keeps this on for the week and that seems to have done extremely well. Integumentary (Hair, Skin) Wound #1 status is Open. Original cause of wound was Radiation Burn. The date acquired was: 07/26/2020. The wound has been in treatment 97 weeks. The wound is located on the Right Breast (mastectomy site). The wound measures 1.7cm length x 3.2cm width x 0.4cm depth; 4.273cm^2 area and 1.709cm^3 volume. There is Fat Layer (Subcutaneous Tissue) exposed. There is no tunneling or undermining noted. There is a medium amount of serosanguineous drainage noted. The wound margin is distinct with the outline attached to the wound base. There is small (1-33%) red, pink granulation within the wound bed. There is a large (67-100%) amount of necrotic tissue within the wound bed including Adherent Slough. 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 There was a Excisional Skin/Subcutaneous Tissue Debridement with a total area of 5.44 sq cm performed by Worthy Keeler, PA. With the following instrument(s): Curette to remove Non-Viable tissue/material. Material removed includes Subcutaneous Tissue, Slough, and Biofilm after achieving pain control using Lidocaine  5% topical ointment. No specimens were taken. A time out was conducted at 09:30, prior to the start of the procedure. A Minimum amount of bleeding was controlled with Pressure. The procedure was tolerated well with a pain level of 0 throughout and a pain level of 0 following the procedure. Post Debridement Measurements: 1.7cm length x 3.2cm width x 0.4cm depth; 1.709cm^3 volume. Character of Wound/Ulcer Post Debridement is improved. Post procedure Diagnosis Wound #1: Same as Pre-Procedure Pre-procedure diagnosis of Wound #1 is a Soft Tissue Radionecrosis located on the Right Breast (mastectomy site). A skin graft procedure using a bioengineered skin substitute/cellular or tissue based product was performed by Worthy Keeler, PA with the following instrument(s): Forceps. Epicord was applied and was not secured. 6 sq cm of product was utilized and 0 sq cm was wasted. Post Application, sorbact was applied. A Time Out was conducted at 09:30, prior to the start of the procedure. The procedure was tolerated well with a pain level of 0 throughout and a pain level of 0 following the procedure. Post procedure Diagnosis Wound #1: Same as Pre-Procedure . Plan Follow-up Appointments: Return Appointment in 1 week. - 07/09/22 @ 9:30 with Orland Jarred, RN (Room 7) Anesthetic: (In clinic) Topical Lidocaine 5% applied to wound bed (In clinic) Topical Lidocaine 4% applied to wound bed Cellular or Tissue Based Products: Cellular or Tissue Based Product Type: - Order Epicord for application on 4/40/34=742% covered Cellular or Tissue Based Product applied to wound bed, secured with steri-strips, cover with Adaptic or Mepitel. (DO NOT REMOVE). - Epicord #1 06/11/22, #2 06/18/22, #3 06/25/22, #49/20/23 Bathing/ Shower/ Hygiene: May shower and wash wound with soap and water. - on days that dressing is changed Additional Orders / Instructions: Follow Nutritious Diet The following medication(s) was prescribed:  lidocaine topical 5 % ointment ointment topical for prior to debridement was prescribed at facility WOUND #1: - Breast (mastectomy site) Wound Laterality: Right Cleanser: Soap and Water 1 x Per Day/30 Days Discharge Instructions: May shower and wash wound with dial antibacterial soap and water prior to dressing change. Peri-Wound Care: Skin Prep (Generic) 1 x Per Day/30 Days Discharge Instructions: Use skin prep as directed Prim Dressing: Cutimed Sorbact Swab 1 x Per Day/30 Days ary Discharge Instructions: Apply to wound bed as instructed Secondary Dressing: ALLEVYN Gentle Border, 5x5 (in/in) 1 x Per Day/30 Days Discharge Instructions: Apply over primary dressing as directed. Secondary Dressing: Woven Gauze Sponges 2x2 in (Generic) 1 x Per Day/30 Days Discharge Instructions: Apply over primary dressing, fold in corners to fill the space 1. Again this was EpiCord treatment #4 and I think she is doing quite well in that regard my hope is that she will continue to make good progress here. 2. I am also can recommend that we have the patient continue with the Sorbact and Allevyn to cover which again she just leaves in place and I think this is doing quite well. Hopefully will continue to see improvements not just in size but also in the depth and undermining which I think we have seen a tremendous amount of at this  point. The tissue is also much healthier and again this is a severely scarred area from radiation damage. We will see patient back for reevaluation in 1 week here in the clinic. If anything worsens or changes patient will contact our office for additional recommendations. Electronic Signature(s) Signed: 07/04/2022 1:48:41 PM By: Worthy Keeler PA-C Entered By: Worthy Keeler on 07/04/2022 13:48:41 -------------------------------------------------------------------------------- SuperBill Details Patient Name: Date of Service: [name], [name] 07/02/2022 Medical Record Number:  [id number] Patient Account Number: [id number] Date of Birth/Sex: Treating RN: 18-Jul-1951 (71 y.o. [name] Primary Care Provider: [name] Other Clinician: Referring Provider: Treating Provider/Extender: [name], [name]: 97 Diagnosis Coding ICD-10 Codes Code Description [phone] Non-pressure chronic ulcer of skin of other sites with fat layer exposed L58.9 Radiodermatitis, unspecified L59.8 Other specified disorders of the skin and subcutaneous tissue related to radiation I10 Essential (primary) hypertension Facility Procedures The patient participates with Medicare or their insurance follows the Medicare Facility Guidelines: CPT4 Code Description Modifier Quantity [phone] Epicord 2cm x 3cm - per sqcm 6 The patient participates with Medicare or their insurance follows the Medicare Facility Guidelines: [id number] - SKIN SUB GRAFT TRNK/ARM/LEG 1 ICD-10 Diagnosis Description L98.492 Non-pressure chronic ulcer of skin of other sites with fat layer  exposed L58.9 Radiodermatitis, unspecified L59.8 Other specified disorders of the skin and subcutaneous tissue related to radiation I10 Essential (primary) hypertension Physician Procedures : CPT4 Code Description Modifier [id number] - WC PHYS SKIN SUB GRAFT TRNK/ARM/LEG ICD-10 Diagnosis Description L98.492 Non-pressure chronic ulcer of skin of other sites with fat layer exposed L58.9 Radiodermatitis, unspecified L59.8 Other specified  disorders of the skin and subcutaneous tissue related to radiation I10 Essential (primary) hypertension Quantity: 1 Electronic Signature(s) Signed: 07/04/2022 1:50:49 PM By: Worthy Keeler PA-C Previous Signature: 07/02/2022 5:51:27 PM Version By: Worthy Keeler PA-C Previous Signature: 07/04/2022 10:29:43 AM Version By: Sharyn Creamer RN, BSN Entered By: Worthy Keeler on 07/04/2022 13:50:49

## 2022-07-04 NOTE — Progress Notes (Signed)
[name], [name] ([id number]) Visit Report for 07/02/2022 Fall Risk Assessment Details Patient Name: Date of Service: [name] YE 07/02/2022 8:45 A M Medical Record Number: [id number] Patient Account Number: [id number] Date of Birth/Sex: Treating RN: Apr 13, 1951 (72 y.o. [name] Primary Care Karalynn Cottone: [name] Other Clinician: Referring Alexius Hangartner: Treating Sadiq Mccauley/Extender: [name], [name]: 97 Fall Risk Assessment Items Have you had 2 or more falls in the last 12 monthso 0 No Have you had any fall that resulted in injury in the last 12 monthso 0 No FALLS RISK SCREEN History of falling - immediate or within 3 months 25 Yes Secondary diagnosis (Do you have 2 or more medical diagnoseso) 15 Yes Ambulatory aid None/bed rest/wheelchair/nurse 0 Yes Crutches/cane/walker 0 No Furniture 0 No Intravenous therapy Access/Saline/Heparin Lock 0 No Gait/Transferring Normal/ bed rest/ wheelchair 0 Yes Weak (short steps with or without shuffle, stooped but able to lift head while walking, may seek 0 No support from furniture) Impaired (short steps with shuffle, may have difficulty arising from chair, head down, impaired 0 No balance) Mental Status Oriented to own ability 0 No Electronic Signature(s) Signed: 07/04/2022 10:29:43 AM By: Sharyn Creamer RN, BSN Entered By: Sharyn Creamer on 07/02/2022 09:10:00

## 2022-07-04 NOTE — Progress Notes (Signed)
[name], [name] ([id number]) Visit Report for 07/02/2022 Arrival Information Details Patient Name: Date of Service: [name] 07/02/2022 8:45 A M Medical Record Number: [id number] Patient Account Number: [id number] Date of Birth/Sex: Treating RN: [birth year] (71 y.o. F) Primary Care Provider: Maryella [name] Other Clinician: Referring Provider: Treating Provider/Extender: [name], [name] in Treatment: 97 Visit Information History Since Last Visit Added or deleted any medications: No Patient Arrived: Ambulatory Any new allergies or adverse reactions: No Arrival Time: 08:38 Had a fall or experienced change in Yes Accompanied By: self activities of daily living that may affect Transfer Assistance: None risk of falls: Patient Requires Transmission-Based Precautions: No Signs or symptoms of abuse/neglect since last visito No Patient Has Alerts: No Hospitalized since last visit: No Implantable device outside of the clinic excluding No cellular tissue based products placed in the center since last visit: Has Dressing in Place as Prescribed: Yes Pain Present Now: Yes Electronic Signature(s) Signed: 07/02/2022 4:44:21 PM By: [name] Entered By: [name] on 07/02/2022 08:39:34 -------------------------------------------------------------------------------- Encounter Discharge Information Details Patient Name: Date of Service: [name] 07/02/2022 8:45 A M Medical Record Number: [id number] Patient Account Number: [id number] Date of Birth/Sex: Treating RN: Sep 21, 1951 (71 y.o. [name] Primary Care Provider: Maryella [name] Other Clinician: Referring Provider: Treating Provider/Extender: [name], [name] [name] in Treatment: 60 Encounter Discharge Information Items Post Procedure Vitals Discharge Condition: Stable Temperature (F): 98.1 Ambulatory Status: Ambulatory Pulse (bpm): 67 Discharge Destination:  Home Respiratory Rate (breaths/min): 18 Transportation: Private Auto Blood Pressure (mmHg): 158/81 Accompanied By: self Schedule Follow-up Appointment: Yes Clinical Summary of Care: Patient Declined Electronic Signature(s) Signed: 07/04/2022 10:29:43 AM By: [name] Entered By: [name] on 07/02/2022 09:48:08 -------------------------------------------------------------------------------- Lower Extremity Assessment Details Patient Name: Date of Service: [name] 07/02/2022 8:45 A M Medical Record Number: [id number] Patient Account Number: [id number] Date of Birth/Sex: Treating RN: [birth year] (71 y.o. F) Primary Care Provider: Maryella [name] Other Clinician: Referring Provider: Treating Provider/Extender: [name], [name] in Treatment: 03 Electronic Signature(s) Signed: 07/02/2022 4:44:21 PM By: [name] Entered By: [name] on 07/02/2022 08:42:36 -------------------------------------------------------------------------------- Multi-Disciplinary Care Plan Details Patient Name: Date of Service: [name] 07/02/2022 8:45 A M Medical Record Number: [id number] Patient Account Number: [id number] Date of Birth/Sex: Treating RN: 06-Feb-1951 (71 y.o. [name] Primary Care Provider: Maryella [name] Other Clinician: Referring Provider: Treating Provider/Extender: [name], [name] in Treatment: 97 [name] reviewed with physician Active Inactive Necrotic Tissue Nursing Diagnoses: Knowledge deficit related to management of necrotic/devitalized tissue Goals: Necrotic/devitalized tissue will be minimized in the wound bed Date Initiated: 07/10/2021 Target Resolution Date: 07/09/2022 Goal Status: Active Interventions: Provide education on necrotic tissue and debridement process Treatment Activities: Excisional debridement : 07/10/2021 Notes: 01/01/22: Continues to use  Santyl 06/11/22: Wound cleaner Wound/Skin Impairment Nursing Diagnoses: Impaired tissue integrity Knowledge deficit related to ulceration/compromised skin integrity Goals: Patient/caregiver will verbalize understanding of skin care regimen Date Initiated: 08/22/2020 Target Resolution Date: 07/09/2022 Goal Status: Active Ulcer/skin breakdown will have a volume reduction of 30% by week 4 Date Initiated: 08/22/2020 Date Inactivated: 10/03/2020 Target Resolution Date: 09/19/2020 Goal Status: Met Ulcer/skin breakdown will have a volume reduction of 50% by week 8 Date Initiated: 06/05/2021 Date Inactivated: 07/17/2021 Target Resolution Date: 07/03/2021 Goal Status: Unmet Unmet Reason: infection Interventions: Assess patient/caregiver ability to obtain necessary supplies Assess patient/caregiver ability to perform ulcer/skin care regimen upon admission and as needed  Assess ulceration(s) every visit Treatment Activities: Skin care regimen initiated : 08/22/2020 Topical wound management initiated : 08/22/2020 Notes: 06/05/21: Wound care regimen ongoing. 06/11/22: Wound care regimen continues, applying skin sub (Epicord) Electronic Signature(s) Signed: 07/04/2022 10:29:43 AM By: [name] Entered By: [name] on 07/02/2022 09:12:17 -------------------------------------------------------------------------------- Pain Assessment Details Patient Name: Date of Service: [name] 07/02/2022 8:45 A M Medical Record Number: [id number] Patient Account Number: [id number] Date of Birth/Sex: Treating RN: 1951-06-29 (71 y.o. F) Primary Care Provider: Maryella [name] Other Clinician: Referring Provider: Treating Provider/Extender: [name], [name] in Treatment: 97 Active Problems Location of Pain Severity and Description of Pain Patient Has Paino Yes Site Locations Pain Location: Generalized Pain Rate the pain. Current Pain Level: 6 Pain Management  and Medication Current Pain Management: Notes S/P fall 06/28/22 Electronic Signature(s) Signed: 07/02/2022 4:44:21 PM By: [name] Entered By: [name] on 07/02/2022 08:40:15 -------------------------------------------------------------------------------- Patient/Caregiver Education Details Patient Name: Date of Service: [name], [name] 9/20/2023andnbsp8:45 A M Medical Record Number: [id number] Patient Account Number: [id number] Date of Birth/Gender: Treating RN: 1951/05/29 (71 y.o. [name] Primary Care Physician: [name] Other Clinician: Referring Physician: Treating Physician/Extender: [name], [name] in Treatment: 62 Education Assessment Education Provided To: Patient Education Topics Provided Nutrition: Methods: Explain/Verbal Responses: State content correctly Safety: Methods: Explain/Verbal Responses: State content correctly Wound Debridement: Methods: Explain/Verbal Responses: State content correctly Electronic Signature(s) Signed: 07/04/2022 10:29:43 AM By: [name] Entered By: [name] on 07/02/2022 09:46:48 -------------------------------------------------------------------------------- Wound Assessment Details Patient Name: Date of Service: [name] 07/02/2022 8:45 A M Medical Record Number: [id number] Patient Account Number: [id number] Date of Birth/Sex: Treating RN: [birth year] (71 y.o. F) Primary Care Provider: Maryella [name] Other Clinician: Referring Provider: Treating Provider/Extender: [name], [name] in Treatment: 97 Wound Status Wound Number: 1 Primary Soft Tissue Radionecrosis Etiology: Wound Location: Right Breast (mastectomy site) Wound Open Wounding Event: Radiation Burn Status: Date Acquired: 07/26/2020 Comorbid Anemia, Lymphedema, Deep Vein Thrombosis, Hypertension, [name] Of Treatment: 97 History: Peripheral Venous Disease,  Osteoarthritis, Received Clustered Wound: No Chemotherapy, Received Radiation, Confinement Anxiety Photos Wound Measurements Length: (cm) 1.7 Width: (cm) 3.2 Depth: (cm) 0.4 Area: (cm) 4.273 Volume: (cm) 1.709 % Reduction in Area: -178.9% % Reduction in Volume: -1017% Epithelialization: None Tunneling: No Undermining: No Wound Description Classification: Full Thickness Without Exposed Support Structures Wound Margin: Distinct, outline attached Exudate Amount: Medium Exudate Type: Serosanguineous Exudate Color: red, brown Foul Odor After Cleansing: No Slough/Fibrino Yes Wound Bed Granulation Amount: Small (1-33%) Exposed Structure Granulation Quality: Red, Pink Fascia Exposed: No Necrotic Amount: Large (67-100%) Fat Layer (Subcutaneous Tissue) Exposed: Yes Necrotic Quality: Adherent Slough Tendon Exposed: No Muscle Exposed: No Joint Exposed: No Bone Exposed: No Treatment Notes Wound #1 (Breast (mastectomy site)) Wound Laterality: Right Cleanser Soap and Water Discharge Instruction: May shower and wash wound with dial antibacterial soap and water prior to dressing change. Peri-Wound Care Skin Prep Discharge Instruction: Use skin prep as directed Topical Primary Dressing Cutimed Sorbact Swab Discharge Instruction: Apply to wound bed as instructed Secondary Dressing ALLEVYN Gentle Border, 5x5 (in/in) Discharge Instruction: Apply over primary dressing as directed. Woven Gauze Sponges 2x2 in Discharge Instruction: Apply over primary dressing, fold in corners to fill the space Secured With Compression Wrap Compression Stockings Add-Ons Electronic Signature(s) Signed: 07/02/2022 4:44:21 PM By: [name] Entered By: [name] on 07/02/2022 08:51:09 -------------------------------------------------------------------------------- Vitals Details Patient Name: Date of Service: [name]  07/02/2022 8:45 A M Medical Record Number: [id number] Patient  Account Number: [id number] Date of Birth/Sex: Treating RN: 1950/12/04 (71 y.o. F) Primary Care Provider: Maryella [name] Other Clinician: Referring Provider: Treating Provider/Extender: [name], [name] in Treatment: 97 Vital Signs Time Taken: 08:42 Temperature (F): 98.1 Height (in): 63 Pulse (bpm): 67 Weight (lbs): 176 Respiratory Rate (breaths/min): 18 Body Mass Index (BMI): 31.2 Blood Pressure (mmHg): 158/81 Reference Range: 80 - 120 mg / dl Electronic Signature(s) Signed: 07/02/2022 4:44:21 PM By: [name] Entered By: [name] on 07/02/2022 08:42:27

## 2022-07-08 DIAGNOSIS — L602 Onychogryphosis: Secondary | ICD-10-CM | POA: Diagnosis not present

## 2022-07-09 ENCOUNTER — Encounter (HOSPITAL_BASED_OUTPATIENT_CLINIC_OR_DEPARTMENT_OTHER): Payer: Medicare Other | Admitting: Physician Assistant

## 2022-07-09 DIAGNOSIS — L98492 Non-pressure chronic ulcer of skin of other sites with fat layer exposed: Secondary | ICD-10-CM | POA: Diagnosis not present

## 2022-07-09 DIAGNOSIS — Z86718 Personal history of other venous thrombosis and embolism: Secondary | ICD-10-CM | POA: Diagnosis not present

## 2022-07-09 DIAGNOSIS — S21001A Unspecified open wound of right breast, initial encounter: Secondary | ICD-10-CM | POA: Diagnosis not present

## 2022-07-09 DIAGNOSIS — I890 Lymphedema, not elsewhere classified: Secondary | ICD-10-CM | POA: Diagnosis not present

## 2022-07-09 DIAGNOSIS — L589 Radiodermatitis, unspecified: Secondary | ICD-10-CM | POA: Diagnosis not present

## 2022-07-09 DIAGNOSIS — L598 Other specified disorders of the skin and subcutaneous tissue related to radiation: Secondary | ICD-10-CM | POA: Diagnosis not present

## 2022-07-09 DIAGNOSIS — I10 Essential (primary) hypertension: Secondary | ICD-10-CM | POA: Diagnosis not present

## 2022-07-09 NOTE — Progress Notes (Signed)
MALLERY, HARSHMAN ([id number]) Visit Report for 07/09/2022 Arrival Information Details Patient Name: Date of Service: [name] 07/09/2022 9:30 A M Medical Record Number: [id number] Patient Account Number: [id number] Date of Birth/Sex: Treating RN: Jun 14, 1951 (71 y.o. [name] Primary Care Jonn Chaikin: [name] Other Clinician: Referring Shawnita Krizek: Treating Jeiden Daughtridge/Extender: [name], [name]: 75 Visit Information History Since Last Visit Added or deleted any medications: No Patient Arrived: Ambulatory Any new allergies or adverse reactions: No Arrival Time: 09:40 Had a fall or experienced change in No Accompanied By: self activities of daily living that may affect Transfer Assistance: None risk of falls: Patient Identification Verified: Yes Signs or symptoms of abuse/neglect since last visito No Secondary Verification Process Completed: Yes Hospitalized since last visit: No Patient Requires Transmission-Based Precautions: No Implantable device outside of the clinic excluding No Patient Has Alerts: No cellular tissue based products placed in the center since last visit: Has Dressing in Place as Prescribed: Yes Pain Present Now: No Electronic Signature(s) Signed: 07/09/2022 4:38:36 PM By: Erenest Blank Entered By: Erenest Blank on 07/09/2022 09:41:38 -------------------------------------------------------------------------------- Encounter Discharge Information Details Patient Name: Date of Service: [name] INS, [name] 07/09/2022 9:30 A M Medical Record Number: [id number] Patient Account Number: [id number] Date of Birth/Sex: Treating RN: 1951-03-28 (71 y.o. [name] Primary Care Brehanna Deveny: [name] Other Clinician: Referring Thanh Mottern: Treating Shaarav Ripple/Extender: [name], [name]: 19 Encounter Discharge Information Items Post Procedure Vitals Discharge Condition: Stable Temperature  (F): 98.1 Ambulatory Status: Ambulatory Pulse (bpm): 76 Discharge Destination: Home Respiratory Rate (breaths/min): 18 Transportation: Private Auto Blood Pressure (mmHg): 163/83 Schedule Follow-up Appointment: Yes Clinical Summary of Care: Provided on 07/09/2022 Form Type Recipient Paper Patient Patient Electronic Signature(s) Signed: 07/09/2022 4:59:35 PM By: Lorrin Jackson Entered By: Lorrin Jackson on 07/09/2022 10:11:57 -------------------------------------------------------------------------------- Lower Extremity Assessment Details Patient Name: Date of Service: [name] INS, [name] 07/09/2022 9:30 A M Medical Record Number: [id number] Patient Account Number: [id number] Date of Birth/Sex: Treating RN: 24-Oct-1950 (71 y.o. [name] Primary Care Pookela Sellin: [name] Other Clinician: Referring Giovanna Kemmerer: Treating Treylan Mcclintock/Extender: [name], [name]: 09 Electronic Signature(s) Signed: 07/09/2022 4:38:36 PM By: Erenest Blank Signed: 07/09/2022 4:59:35 PM By: Fara Chute By: Erenest Blank on 07/09/2022 09:43:03 -------------------------------------------------------------------------------- Multi-Disciplinary Care Plan Details Patient Name: Date of Service: [name] INS, [name] 07/09/2022 9:30 A M Medical Record Number: [id number] Patient Account Number: [id number] Date of Birth/Sex: Treating RN: March 18, 1951 (71 y.o. [name] Primary Care Arjuna Doeden: [name] Other Clinician: Referring Ettamae Barkett: Treating Lovette Merta/Extender: [name], [name] in Treatment: 98 [place] reviewed with physician Active Inactive Wound/Skin Impairment Nursing Diagnoses: Impaired tissue integrity Knowledge deficit related to ulceration/compromised skin integrity Goals: Patient/caregiver will verbalize understanding of skin care regimen Date Initiated: 08/22/2020 Target Resolution Date:  08/06/2022 Goal Status: Active Ulcer/skin breakdown will have a volume reduction of 30% by week 4 Date Initiated: 08/22/2020 Date Inactivated: 10/03/2020 Target Resolution Date: 09/19/2020 Goal Status: Met Ulcer/skin breakdown will have a volume reduction of 50% by week 8 Date Initiated: 06/05/2021 Date Inactivated: 07/17/2021 Target Resolution Date: 07/03/2021 Goal Status: Unmet Unmet Reason: infection Interventions: Assess patient/caregiver ability to obtain necessary supplies Assess patient/caregiver ability to perform ulcer/skin care regimen upon admission and as needed Assess ulceration(s) every visit Treatment Activities: Skin care regimen initiated : 08/22/2020 Topical wound management initiated : 08/22/2020 Notes: 06/05/21: Wound care regimen ongoing. 06/11/22: Wound care regimen continues, applying skin sub (Epicord) Electronic  Signature(s) Signed: 07/09/2022 4:59:35 PM By: Lorrin Jackson Entered By: Lorrin Jackson on 07/09/2022 10:04:59 -------------------------------------------------------------------------------- Pain Assessment Details Patient Name: Date of Service: [name] INS, [name] 07/09/2022 9:30 A M Medical Record Number: [id number] Patient Account Number: [id number] Date of Birth/Sex: Treating RN: [birth year] (71 y.o. [name] Primary Care Wilmont Olund: [name] Other Clinician: Referring Jaquay Morneault: Treating Leeman Johnsey/Extender: [name], [name]: 98 Active Problems Location of Pain Severity and Description of Pain Patient Has Paino No Site Locations Pain Management and Medication Current Pain Management: Electronic Signature(s) Signed: 07/09/2022 4:38:36 PM By: Erenest Blank Signed: 07/09/2022 4:59:35 PM By: Lorrin Jackson Entered By: Erenest Blank on 07/09/2022 09:42:40 -------------------------------------------------------------------------------- Patient/Caregiver Education Details Patient Name: Date of  Service: [name], [name] 9/27/2023andnbsp9:30 A M Medical Record Number: [id number] Patient Account Number: [id number] Date of Birth/Gender: Treating RN: January 10, 1951 (71 y.o. [name] Primary Care Physician: [name] Other Clinician: Referring Physician: Treating Physician/Extender: [name], [name] in Treatment: 75 Education Assessment Education Provided To: Patient Education Topics Provided Wound/Skin Impairment: Methods: Explain/Verbal, Printed Responses: State content correctly [employer]) Signed: 07/09/2022 4:59:35 PM By: Lorrin Jackson Entered By: Lorrin Jackson on 07/09/2022 10:05:17 -------------------------------------------------------------------------------- Wound Assessment Details Patient Name: Date of Service: [name] INS, [name] 07/09/2022 9:30 A M Medical Record Number: [id number] Patient Account Number: [id number] Date of Birth/Sex: Treating RN: 09-18-1951 (71 y.o. [name] Primary Care Mandi Mattioli: [name] Other Clinician: Referring Veyda Kaufman: Treating [place] Marchiano/Extender: [name], [name]: 98 Wound Status Wound Number: 1 Primary Soft Tissue Radionecrosis Etiology: Wound Location: Right Breast (mastectomy site) Wound Open Wounding Event: Radiation Burn Status: Date Acquired: 07/26/2020 Comorbid Anemia, Lymphedema, Deep Vein Thrombosis, Hypertension, Weeks Of Treatment: 98 History: Peripheral Venous Disease, Osteoarthritis, Received Clustered Wound: No Chemotherapy, Received Radiation, Confinement Anxiety Photos Wound Measurements Length: (cm) 2.7 Width: (cm) 1.4 Depth: (cm) 0.4 Area: (cm) 2.969 Volume: (cm) 1.188 % Reduction in Area: -93.8% % Reduction in Volume: -676.5% Epithelialization: None Tunneling: No Undermining: Yes Starting Position (o'clock): 10 Ending Position (o'clock): 12 Maximum Distance: (cm) 0.4 Wound Description Classification:  Full Thickness Without Exposed Support Structures Wound Margin: Distinct, outline attached Exudate Amount: Medium Exudate Type: Serosanguineous Exudate Color: red, brown Foul Odor After Cleansing: No Slough/Fibrino Yes Wound Bed Granulation Amount: Small (1-33%) Exposed Structure Granulation Quality: Red, Pink Fascia Exposed: No Necrotic Amount: Large (67-100%) Fat Layer (Subcutaneous Tissue) Exposed: Yes Necrotic Quality: Adherent Slough Tendon Exposed: No Muscle Exposed: No Joint Exposed: No Bone Exposed: No Treatment Notes Wound #1 (Breast (mastectomy site)) Wound Laterality: Right Cleanser Soap and Water Discharge Instruction: May shower and wash wound with dial antibacterial soap and water prior to dressing change. Peri-Wound Care Skin Prep Discharge Instruction: Use skin prep as directed Topical Primary Dressing Cutimed Sorbact Swab Discharge Instruction: Apply to wound bed as instructed Secondary Dressing ALLEVYN Gentle Border, 5x5 (in/in) Discharge Instruction: Apply over primary dressing as directed. Woven Gauze Sponges 2x2 in Discharge Instruction: Apply over primary dressing, fold in corners to fill the space Secured With Compression Wrap Compression Stockings Add-Ons Electronic Signature(s) Signed: 07/09/2022 1:33:45 PM By: [name] RN, BSN Signed: 07/09/2022 4:59:35 PM By: Lorrin Jackson Entered By: [name] on 07/09/2022 09:45:59 -------------------------------------------------------------------------------- Vitals Details Patient Name: Date of Service: [name] INS, [name] 07/09/2022 9:30 A M Medical Record Number: [id number] Patient Account Number: [id number] Date of Birth/Sex: Treating RN: 1951/09/06 (71 y.o. [name] Primary Care Christian Borgerding: [name] Other Clinician: Referring Adelfa Lozito: Treating Caelan Branden/Extender:  [name], [name], [name]: 98 Vital Signs Time Taken: 09:41 Temperature (F):  98.1 Height (in): 63 Pulse (bpm): 76 Weight (lbs): 176 Respiratory Rate (breaths/min): 18 Body Mass Index (BMI): 31.2 Blood Pressure (mmHg): 163/83 Reference Range: 80 - 120 mg / dl Electronic Signature(s) Signed: 07/09/2022 4:38:36 PM By: Erenest Blank Entered By: Erenest Blank on 07/09/2022 09:42:12

## 2022-07-09 NOTE — Progress Notes (Addendum)
LAKHIA, GENGLER ([id number]) Visit Report for 07/09/2022 Chief Complaint Document Details Patient Name: Date of Service: [name] 07/09/2022 9:30 A M Medical Record Number: [id number] Patient Account Number: [id number] Date of Birth/Sex: Treating RN: 1951-10-01 (71 y.o. 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 [name] Primary Care Provider: [name] Other Clinician: Referring Provider: Treating Provider/Extender: [name], [name]: 98 Cellular or Tissue Based Product Type Wound #1 Right Breast (mastectomy site) Applied to: Performed By: Physician Worthy Keeler, PA Cellular or Tissue Based Product Type: Epicord Level of Consciousness (Pre-procedure): Awake and Alert Pre-procedure Verification/Time Out Yes - 09:57 Taken: Location: trunk / arms / legs Wound Size (sq cm): 3.78 Product Size (sq cm): 6 Waste Size (sq cm): 0 Amount of Product Applied (sq cm): 6 Instrument Used: Forceps, Scissors Lot #: [id number] Expiration Date: 02/11/2027 Reconstituted: Yes Solution Type: Normal Saline Solution Amount: 20m Lot #: [id number] Expiration Date: 07/13/2022 Secured:  Yes Secured With: Sorbact Dressing Applied: Yes Primary Dressing: Gauze, Allevyn Response to Treatment: Procedure was tolerated well Level of Consciousness (Post- Awake and Alert procedure): Post Procedure Diagnosis Same as Pre-procedure Notes Procedure scribed for HKelli Churn PA by JJanan Halter RN Electronic Signature(s) Signed: 07/09/2022 4:59:35 PM By: BLorrin JacksonSigned: 07/09/2022 5:27:13 PM By: SWorthy KeelerPA-C Entered By: BLorrin Jacksonon 07/09/2022 10:01:52 -------------------------------------------------------------------------------- HPI Details Patient Name: Date of Service: [name], [name] 07/09/2022 9:30 A M Medical Record Number: [id number] Account Number: [id number] of Birth/Sex: Treating RN: 211/08/1951((71y.o. FSue LushPrimary Care Provider: HMaryella ShiversOther Clinician: Referring Provider: Treating Provider/Extender: SZenon Mayo F[place]Weeks in Treatment: 958History of Present Illness HPI Description: 08/22/2020 upon evaluation today patient actually appears to be doing somewhat poorly in regard to her mastectomy site on the right chest wall. She had a mastectomy initially in 1998. She had chemotherapy only no radiation following. In 2002 she subsequently did undergo radiation for the first time and then subsequently in 2012 had repeat radiation after having had a finding that was consistent with a relapse of the cancer. 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 She also has radiation damage to the skin over the right chest wall where she had the mastectomy. [facility] [place] PBurney Gauze Subsequently I want to make sure before we delve into this more deeply that the patient does not have any issues with a return of cancer that needs to be managed by them. Obviously she does have significant scar tissue  which may be benefited secondary to the soft tissue radionecrosis by hyperbaric oxygen therapy in the absence of any recurrence of the cancer. Nonetheless she does not remember exactly when her last PET scan was but it was not too recently she tells me. She does  have a history of a DVT in the right leg in 2013. The patient does have hypertension as well. She sees her oncologist next on December 6. 09/12/2020 on evaluation today patient actually appears to be doing excellent in regard to her wound under the right breast location. Currently this is measuring smaller in general seems to be doing very well. She tells me that the collagen is doing a good job and that the dressing that she has been putting on is not causing any troubles as far pulling on her skin or scar tissue is concerned all of which is excellent news. 10/03/2020 upon evaluation today patient appears to be doing well with regard to her surgical site from her mastectomy on the right breast region. She tells me that she has had very little bleeding nothing seems to be sticking badly and in general she has been extremely pleased with where things stand today. No fevers, chills, nausea, vomiting, or diarrhea. 10/24/2020 upon evaluation today patient actually appears to be doing excellent in regard to her wound. There is just a very small area still open and to be honest there was minimal slough noted on the surface of the wound nothing that requires sharp debridement. In general I feel like she is actually doing quite well and overall I am pleased. I do think we may switch to silver alginate dressing and also contemplating using a AandE ointment in order to help keep everything moist and the scar tissue region while the alginate keeps it dry enough to heal 11/14/2020 upon evaluation today patient appears to be doing well at this point in regard to her wound. I do feel like that she is making good progress again this is a very difficult region  over the surgical site of the right chest wall at the site of mastectomy. Nonetheless I think that we are just a very small area still open I think alginate is doing a good job helping to dry this up and I would recommend this such that we continue with this. 01/02/2021 on evaluation today patient's wound actually appears to be doing decently well. There does not appear to be any signs of active infection which is great news. She does have some slight slough buildup with biofilm on the surface of the wound mainly more biofilm. Nonetheless I was able to gently remove this with saline and gauze as well as a sterile Q-tip. She tolerated that today without complication. 01/30/2021 upon evaluation today patient appears to be doing well with regard to her wound although she still has quite a bit of trouble getting this to close I think the scar tissue and radiation damage is quite significant here unfortunately. There does not appear to be any signs of active infection which is great news. No fevers, chills, nausea, vomiting, or diarrhea. 02/27/2021 upon evaluation today patient appears to be doing excellent in regard to her wound. She has been tolerating the dressing changes without complication and in general I am extremely pleased with where things stand today. There does not appear to be any signs of infection which is great news. No fevers, chills, nausea, vomiting, or diarrhea. With that being said I do think that she still developing some slough buildup. I did discuss with her today the possibility of proceeding with HBO therapy. We have had this discussion before but she really is not quite committed to wanting to do that much and spend that much time in the chamber. She wants to still think about  it. 03/27/2021 upon evaluation today patient appears to be doing about the same in regard to her wound. She still developing a lot of slough buildup on the surface of the wound. I did try to clean that away to  some degree today. She tolerated that without any pain or complication. Subsequently I am thinking we may want to switch over to [facility] see if this may do better for her. Patient is in agreement with giving that a trial. 05/01/2021 upon evaluation today patient appears to be doing about the same in regard to her wounds. She has been tolerating the dressing changes without complication. Fortunately there does not appear to be any signs of active infection at this time which is great news. No fevers, chills, nausea, vomiting, or diarrhea.. 06/05/2021 upon evaluation today patient appears to be doing pretty well currently in regard to her wound at the mastectomy site right chest wall. Overall since we switch back to the alginate she tells me things have significantly improved she is much happier with where things stand currently. Fortunately there does not appear to be any signs of active infection which is great news. No fevers, chills, nausea, vomiting, or diarrhea. 07/10/2021 upon evaluation today patient unfortunately does not appear to be doing nearly as well as what she previously was. There does not appear to be any evidence of new epithelial growth she has a lot of necrotic tissue the base of the wound this is much more significant than what we previously noted. She also has been having increased pain and increased drainage. In general I am very concerned about this especially in light of the fact that she does have a history of breast cancer I want to ensure that were not looking at any cancerous type lesion at this point. Otherwise also think she does need some debridement we did do MolecuLight scanning today. 07/17/2021 upon evaluation today patient appears to be doing well with regard to her wound compared to last week although she still having some erythema I am concerned in this regard. I do think that she fortunately had a negative biopsy which is great news unfortunately she did have  Pseudomonas noted as a bacteria present in the culture which I think is good and need to be addressed with Levaquin. I Minna send that into the pharmacy today. We did repeat the MolecuLight screening. 07/31/2021 upon evaluation today patient's wound is actually showing signs of improvement which is great news. Fortunately there does not appear to be any signs of infection currently locally nor systemically and I am very pleased in that regard. With that being said the patient is having some issues here with continued necrotic tissue I think packing with the Dakin's moistened gauze dressing is still in the right leg ago. 08/14/2021 upon evaluation today patient appears to be doing well with regard to her wound all things considered I do not see any signs of significant infection which is great news. Fortunately there does not appear to be any evidence of infection which is also great news. In general I think that the patient is tolerating the dressing changes well. We been using a Dakin's moistened gauze. 08/28/2021 upon evaluation today patient appears to be doing well with regard to her wound. Worsening signs of definite improvement which is great news and overall very pleased with where we stand today. There is no signs of inflamed tissue or infection which is great as well and I think the Dakin's is doing a great job  here. 09/11/2021 upon evaluation today patient appears to be doing well with regard to her wound all things considered. I am can perform some debridement today to clear away some of the necrotic debris with that being said overall I think that she is making decently good progress here which is great news. There does not appear to be any signs of active infection also good news. That in fact is probably the best news in her mind. 09/25/2021 upon evaluation today patient appears to be doing decently well in regard to her wound. Overall I do not see any signs of infection and I think  she is doing quite well. I do believe that we are making headway here although is very slow due to the scarring and the depth of the wound this is a significantly deep wound. 10/16/2021 upon evaluation today patient appears to be doing about the same in regard to the wound on her right chest wall. Fortunately there does not appear to be any signs of active infection locally nor systemically which is great news. With that being said this still was not cleaning up quite as quickly as I would like to see. Fortunately I think that we can definitely give the Santyl another try we tried this in the past and unfortunately did not have a good result but again I think she was infected at that time as well which was part of the issue. Nonetheless I think currently that we will be able to go ahead and see what we can do about getting this little cleaner with the Santyl. 11/06/2021 upon evaluation today patient appears to be doing well with regard to her wound. This actually appears to be a little bit better with the Santyl I am happy in that regard. Fortunately I do not see any signs of active infection at this time. No fevers, chills, nausea, vomiting, or diarrhea. 12/04/2021 upon evaluation patient appears to be doing well with the Santyl at this point. I am very pleased with where we stand and I think that she is making good progress here. Fortunately I do not see any evidence of active infection locally or systemically at this time which is great news. No fevers, chills, nausea, vomiting, or diarrhea. 01/01/2022 upon evaluation today patient actually is making excellent progress with the Santyl. I am extremely pleased with where we stand today. I do not see any signs of infection. 01-29-2022 upon evaluation today patient appears to be doing well with regard to her wound. This is showing signs of some improvement. It is a little less deep than what it was. Size wise is also slightly smaller but again there still  is a significant wound in this area. Fortunately I do not see any evidence of infection and she is doing quite well. 02-26-2022 upon evaluation today patient appears to be doing well with regard to her wound. In fact this is actually the best that I have seen in a very long time. I do not see any evidence of active infection at this time locally or systemically which is great news and overall I think we are on the right track. Nonetheless I do believe that the patient is continuing to show evidence of improvement with the Santyl week by week. 03-26-2022 upon evaluation today patient appears to be doing well with regard to the wound on her right mastectomy site. She is actually showing signs of excellent improvement in fact there was some bleeding just with a little bit of light  cleaning over the area. Fortunately I do not see any signs of active infection locally or systemically at this time which is great news. 04-30-2022 upon evaluation today patient appears to be doing well currently in regard to her wound. She is actually showing some signs of improvement here which is great news. Still this is very very slow. Subsequently I do believe that she may benefit from the use of [place] topical antibiotics for short amount of time before applying a skin substitute I think EpiCord could be doing very well for her as far as that is concerned. I discussed that with her today. Fortunately I do not see any evidence of active infection locally or systemically at this time which is great news. No fevers, chills, nausea, vomiting, or diarrhea. 05-28-2022 upon evaluation today patient's wound is actually showing signs of significant improvement. Fortunately I do not see any evidence of infection at this time which is great news. No fevers, chills, nausea, vomiting, or diarrhea. With that being said I do believe that the patient is tolerating the dressing changes without complication. We been using the [facility] which  I think is helpful. We do have approval for the [place]. 06-11-2022 upon evaluation today patient appears to be doing well with regard to her wound she is actually here today for the first application of [place] which I think is good to be beneficial for her. I do think that we will probably be able to keep this in place without can be the one thing of question to be honest. 06-18-2022 upon evaluation today patient actually appears to be doing excellent today. She has 1 treatment of the EpiCord underway and the wound surface already looks much better. This will be EpiCord #2 today. Fortunately I think we are on the right track here. 06-25-2022 upon evaluation today patient appears to be making good progress and overall the patient seems to have tolerated EpiCord without any complication. I do feel like that the wound is improving quite significantly. This is EpiCord #3 today. 07-02-2021 upon evaluation today patient's wound is actually showing signs of improvement. I am actually very pleased with where we stand we have been seeing some improvements in size overall and I think that we are still in the right track although there is a little bit of need for sharp debridement today to clearway some of the necrotic debris and remaining EpiCord which is actually still somewhat adhered to the wound bed. Overall I am extremely pleased though with where we stand. This is EpiCord #4 today. Upon evaluation today patient appears to be doing well with regard to her wound the EpiCord is definitely helping and does seem to be improving the overall status of the wound the surface is dramatically improved compared to what it was at the start of this. The tissue is actually becoming more red than it is just a pale pink and to be honest some of the did not even appear to be pink in the start. Nonetheless I am very pleased with where we stand currently. No fevers, chills, nausea, vomiting, or diarrhea. Electronic  Signature(s) Signed: 07/09/2022 10:06:31 AM By: Worthy Keeler PA-C Signed: 07/09/2022 10:06:31 AM By: Worthy Keeler PA-C Entered By: Worthy Keeler on 07/09/2022 10:06:30 -------------------------------------------------------------------------------- Physical Exam Details Patient Name: Date of Service: [name], [name] 07/09/2022 9:30 A M Medical Record Number: [id number] Patient Account Number: [id number] Date of Birth/Sex: Treating RN: 07/20/1951 (71 y.o. [name] Primary Care Provider: [name]  Other Clinician: Referring Provider: Treating Provider/Extender: [name], [name]: 98 Constitutional [name] and well-hydrated in no acute distress. Respiratory normal breathing without difficulty. Psychiatric this patient is able to make decisions and demonstrates good insight into disease process. Alert and Oriented x 3. pleasant and cooperative. Notes Patient's wound did not require any sharp debridement today which is great news and overall I am actually extremely pleased with where we stand. I definitely think that we are on the right track here and this does appear to be much healthier compared to where we have been previous. I am very pleased with the help of the tissue and I feel like it also has filled in quite a bit compared to what we used to have from a depth standpoint. She is very pleased to hear this as well. Electronic Signature(s) Signed: 07/09/2022 10:07:35 AM By: Worthy Keeler PA-C Entered By: Worthy Keeler on 07/09/2022 10:07:35 -------------------------------------------------------------------------------- Physician Orders Details Patient Name: Date of Service: [name], [name] 07/09/2022 9:30 A M Medical Record Number: [id number] Patient Account Number: [id number] Date of Birth/Sex: Treating RN: Sep 07, 1951 (71 y.o. [name] Primary Care Provider: [name] Other Clinician: Referring  Provider: Treating Provider/Extender: [name], [name]: 25 Verbal / Phone Orders: No Diagnosis Coding ICD-10 Coding Code Description L98.492 Non-pressure chronic ulcer of skin of other sites with fat layer exposed L58.9 Radiodermatitis, unspecified L59.8 Other specified disorders of the skin and subcutaneous tissue related to radiation I10 Essential (primary) hypertension Follow-up Appointments ppointment in 1 week. - 07/16/22 @ 8:45am with Orland Jarred, RN (Room 7) Return A Anesthetic (In clinic) Topical Lidocaine 5% applied to wound bed (In clinic) Topical Lidocaine 4% applied to wound bed Cellular or Tissue Based Products Cellular or Tissue Based Product Type: - Order Epicord for application on 0/34/74=259% covered daptic or Mepitel. (DO NOT REMOVE). - Cellular or Tissue Based Product applied to wound bed, secured with steri-strips, cover with A Epicord #1 06/11/22, #2 06/18/22, #3 06/25/22, #4 07/02/22, #5 07/09/22 Bathing/ Shower/ Hygiene May shower with protection but do not get wound dressing(s) wet. Additional Orders / Instructions Follow Nutritious Diet Wound Treatment Wound #1 - Breast (mastectomy site) Wound Laterality: Right Cleanser: Soap and Water 1 x Per Day/30 Days Discharge Instructions: May shower and wash wound with dial antibacterial soap and water prior to dressing change. Peri-Wound Care: Skin Prep (Generic) 1 x Per Day/30 Days Discharge Instructions: Use skin prep as directed Prim Dressing: Cutimed Sorbact Swab 1 x Per Day/30 Days ary Discharge Instructions: Apply to wound bed as instructed Secondary Dressing: ALLEVYN Gentle Border, 5x5 (in/in) 1 x Per Day/30 Days Discharge Instructions: Apply over primary dressing as directed. Secondary Dressing: Woven Gauze Sponges 2x2 in (Generic) 1 x Per Day/30 Days Discharge Instructions: Apply over primary dressing, fold in corners to fill the space Electronic Signature(s) Signed:  07/09/2022 4:59:35 PM By: Lorrin Jackson Signed: 07/09/2022 5:27:13 PM By: Worthy Keeler PA-C Entered By: Lorrin Jackson on 07/09/2022 10:04:38 -------------------------------------------------------------------------------- Problem List Details Patient Name: Date of Service: [name], [name] 07/09/2022 9:30 A M Medical Record Number: [id number] Patient Account Number: [id number] Date of Birth/Sex: Treating RN: 04/13/1951 (71 y.o. [name] Primary Care Provider: [name] Other Clinician: Referring Provider: Treating Provider/Extender: [name], [name]: 104 Active Problems ICD-10 Encounter Code Description Active Date MDM Diagnosis L98.492 Non-pressure chronic ulcer of skin of other sites with fat layer exposed 08/22/2020  No Yes L58.9 Radiodermatitis, unspecified 08/22/2020 No Yes L59.8 Other specified disorders of the skin and subcutaneous tissue related to 08/22/2020 No Yes radiation I10 Essential (primary) hypertension 08/22/2020 No Yes Inactive Problems Resolved Problems Electronic Signature(s) Signed: 07/09/2022 9:48:17 AM By: Worthy Keeler PA-C Entered By: Worthy Keeler on 07/09/2022 09:48:17 -------------------------------------------------------------------------------- Progress Note Details Patient Name: Date of Service: [name], [name] 07/09/2022 9:30 A M Medical Record Number: [id number] Patient Account Number: [id number] Date of Birth/Sex: Treating RN: 1951/08/01 (71 y.o. 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 She had a mastectomy initially in 1998. She had chemotherapy only no radiation following. In 2002 she subsequently did undergo radiation for the first time and then subsequently in 2012 had repeat radiation after having had a finding that was consistent with a relapse of the cancer. 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 She also has radiation damage to the skin over the right chest wall where she had the mastectomy. She was referred to [place] by Dr. Matthew Saras in [facility] and her oncologist is Dr. Burney Gauze. Subsequently I want to make sure before we delve into this more deeply that the patient does not have any issues with a return of cancer that needs to be managed by them. 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 Nonetheless she does not remember exactly when her last PET scan was but it was not too recently she tells me. 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 The patient does have hypertension as well. She sees her oncologist next on December 6. 09/12/2020 on evaluation today patient actually appears to be doing excellent in regard to her wound under the right breast location. Currently this is measuring smaller in general seems to be doing very well. She tells me that the collagen is doing a good job and that the dressing that she has been putting on is not causing any troubles as far pulling on her skin or scar tissue is concerned all of which is excellent news. 10/03/2020 upon evaluation today patient appears to be doing well with regard to her surgical site from her mastectomy on the right breast region. She tells me that she has had very little bleeding nothing seems to be sticking badly and in general she has been extremely pleased with  where things stand today. No fevers, chills, nausea, vomiting, or diarrhea. 10/24/2020 upon evaluation today patient actually appears to be doing excellent in regard to her wound. There is just a very small area still open and to be honest there was minimal slough noted on the surface of the wound nothing that requires sharp debridement. In general I feel like she is actually doing quite well and overall I am pleased. I do think we may switch to silver alginate dressing and also contemplating using a AandE ointment in order to help keep everything moist and the scar tissue region while the alginate keeps it dry enough to  heal 11/14/2020 upon evaluation today patient appears to be doing well at this point in regard to her wound. I do feel like that she is making good progress again this is a very difficult region over the surgical site of the right chest wall at the site of mastectomy. Nonetheless I think that we are just a very small area still open I think alginate is doing a good job helping to dry this up and I would recommend this such that we continue with this. 01/02/2021 on evaluation today patient's wound actually appears to be doing decently well. There does not appear to be any signs of active infection which is great news. She does have some slight slough buildup with biofilm on the surface of the wound mainly more biofilm. Nonetheless I was able to gently remove this with saline and gauze as well as a sterile Q-tip. She tolerated that today without complication. 01/30/2021 upon evaluation today patient appears to be doing well with regard to her wound although she still has quite a bit of trouble getting this to close I think the scar tissue and radiation damage is quite significant here unfortunately. There does not appear to be any signs of active infection which is great news. No fevers, chills, nausea, vomiting, or diarrhea. 02/27/2021 upon evaluation today patient appears to be doing  excellent in regard to her wound. She has been tolerating the dressing changes without complication and in general I am extremely pleased with where things stand today. There does not appear to be any signs of infection which is great news. No fevers, chills, nausea, vomiting, or diarrhea. With that being said I do think that she still developing some slough buildup. I did discuss with her today the possibility of proceeding with HBO therapy. We have had this discussion before but she really is not quite committed to wanting to do that much and spend that much time in the chamber. She wants to still think about it. 03/27/2021 upon evaluation today patient appears to be doing about the same in regard to her wound. She still developing a lot of slough buildup on the surface of the wound. I did try to clean that away to some degree today. She tolerated that without any pain or complication. Subsequently I am thinking we may want to switch over to [facility] see if this may do better for her. Patient is in agreement with giving that a trial. 05/01/2021 upon evaluation today patient appears to be doing about the same in regard to her wounds. She has been tolerating the dressing changes without complication. Fortunately there does not appear to be any signs of active infection at this time which is great news. No fevers, chills, nausea, vomiting, or diarrhea.. 06/05/2021 upon evaluation today patient appears to be doing pretty well currently in regard to her wound at the mastectomy site right chest wall. Overall since we switch back to the alginate she tells me things have significantly improved she is much happier with where things stand currently. Fortunately there does not appear to be any signs of active infection which is great news. No fevers, chills, nausea, vomiting, or diarrhea. 07/10/2021 upon evaluation today patient unfortunately does not appear to be doing nearly as well as what she previously was.  There does not appear to be any evidence of new epithelial growth she has a lot of necrotic tissue the base of the wound this is much more significant than what we previously noted. She also has been  having increased pain and increased drainage. In general I am very concerned about this especially in light of the fact that she does have a history of breast cancer I want to ensure that were not looking at any cancerous type lesion at this point. Otherwise also think she does need some debridement we did do [employer] scanning today. 07/17/2021 upon evaluation today patient appears to be doing well with regard to her wound compared to last week although she still having some erythema I am concerned in this regard. I do think that she fortunately had a negative biopsy which is great news unfortunately she did have Pseudomonas noted as a bacteria present in the culture which I think is good and need to be addressed with Levaquin. I Minna send that into the pharmacy today. We did repeat the Molecu[employer] screening. 07/31/2021 upon evaluation today patient's wound is actually showing signs of improvement which is great news. Fortunately there does not appear to be any signs of infection currently locally nor systemically and I am very pleased in that regard. With that being said the patient is having some issues here with continued necrotic tissue I think packing with the Dakin's moistened gauze dressing is still in the right leg ago. 08/14/2021 upon evaluation today patient appears to be doing well with regard to her wound all things considered I do not see any signs of significant infection which is great news. Fortunately there does not appear to be any evidence of infection which is also great news. In general I think that the patient is tolerating the dressing changes well. We been using a Dakin's moistened gauze. 08/28/2021 upon evaluation today patient appears to be doing well with regard to her wound.  Worsening signs of definite improvement which is great news and overall very pleased with where we stand today. There is no signs of inflamed tissue or infection which is great as well and I think the Dakin's is doing a great job here. 09/11/2021 upon evaluation today patient appears to be doing well with regard to her wound all things considered. I am can perform some debridement today to clear away some of the necrotic debris with that being said overall I think that she is making decently good progress here which is great news. There does not appear to be any signs of active infection also good news. That in fact is probably the best news in her mind. 09/25/2021 upon evaluation today patient appears to be doing decently well in regard to her wound. Overall I do not see any signs of infection and I think she is doing quite well. I do believe that we are making headway here although is very slow due to the scarring and the depth of the wound this is a significantly deep wound. 10/16/2021 upon evaluation today patient appears to be doing about the same in regard to the wound on her right chest wall. Fortunately there does not appear to be any signs of active infection locally nor systemically which is great news. With that being said this still was not cleaning up quite as quickly as I would like to see. Fortunately I think that we can definitely give the Santyl another try we tried this in the past and unfortunately did not have a good result but again I think she was infected at that time as well which was part of the issue. Nonetheless I think currently that we will be able to go ahead and see what we can do  about getting this little cleaner with the Santyl. 11/06/2021 upon evaluation today patient appears to be doing well with regard to her wound. This actually appears to be a little bit better with the Santyl I am happy in that regard. Fortunately I do not see any signs of active infection at this  time. No fevers, chills, nausea, vomiting, or diarrhea. 12/04/2021 upon evaluation patient appears to be doing well with the Santyl at this point. I am very pleased with where we stand and I think that she is making good progress here. Fortunately I do not see any evidence of active infection locally or systemically at this time which is great news. No fevers, chills, nausea, vomiting, or diarrhea. 01/01/2022 upon evaluation today patient actually is making excellent progress with the Santyl. I am extremely pleased with where we stand today. I do not see any signs of infection. 01-29-2022 upon evaluation today patient appears to be doing well with regard to her wound. This is showing signs of some improvement. It is a little less deep than what it was. Size wise is also slightly smaller but again there still is a significant wound in this area. Fortunately I do not see any evidence of infection and she is doing quite well. 02-26-2022 upon evaluation today patient appears to be doing well with regard to her wound. In fact this is actually the best that I have seen in a very long time. I do not see any evidence of active infection at this time locally or systemically which is great news and overall I think we are on the right track. Nonetheless I do believe that the patient is continuing to show evidence of improvement with the Santyl week by week. 03-26-2022 upon evaluation today patient appears to be doing well with regard to the wound on her right mastectomy site. She is actually showing signs of excellent improvement in fact there was some bleeding just with a little bit of light cleaning over the area. Fortunately I do not see any signs of active infection locally or systemically at this time which is great news. 04-30-2022 upon evaluation today patient appears to be doing well currently in regard to her wound. She is actually showing some signs of improvement here which is great news. Still this is  very very slow. Subsequently I do believe that she may benefit from the use of [place] topical antibiotics for short amount of time before applying a skin substitute I think EpiCord could be doing very well for her as far as that is concerned. I discussed that with her today. Fortunately I do not see any evidence of active infection locally or systemically at this time which is great news. No fevers, chills, nausea, vomiting, or diarrhea. 05-28-2022 upon evaluation today patient's wound is actually showing signs of significant improvement. Fortunately I do not see any evidence of infection at this time which is great news. No fevers, chills, nausea, vomiting, or diarrhea. With that being said I do believe that the patient is tolerating the dressing changes without complication. We been using the [facility] which I think is helpful. We do have approval for the [place]. 06-11-2022 upon evaluation today patient appears to be doing well with regard to her wound she is actually here today for the first application of [place] which I think is good to be beneficial for her. I do think that we will probably be able to keep this in place without can be the one thing of question to  be honest. 06-18-2022 upon evaluation today patient actually appears to be doing excellent today. She has 1 treatment of the EpiCord underway and the wound surface already looks much better. This will be EpiCord #2 today. Fortunately I think we are on the right track here. 06-25-2022 upon evaluation today patient appears to be making good progress and overall the patient seems to have tolerated EpiCord without any complication. I do feel like that the wound is improving quite significantly. This is EpiCord #3 today. 07-02-2021 upon evaluation today patient's wound is actually showing signs of improvement. I am actually very pleased with where we stand we have been seeing some improvements in size overall and I think that we are still in the  right track although there is a little bit of need for sharp debridement today to clearway some of the necrotic debris and remaining EpiCord which is actually still somewhat adhered to the wound bed. Overall I am extremely pleased though with where we stand. This is EpiCord #4 today. Upon evaluation today patient appears to be doing well with regard to her wound the EpiCord is definitely helping and does seem to be improving the overall status of the wound the surface is dramatically improved compared to what it was at the start of this. The tissue is actually becoming more red than it is just a pale pink and to be honest some of the did not even appear to be pink in the start. Nonetheless I am very pleased with where we stand currently. No fevers, chills, nausea, vomiting, or diarrhea. Objective Constitutional [name] and well-hydrated in no acute distress. Vitals Time Taken: 9:41 AM, Height: 63 in, Weight: 176 lbs, BMI: 31.2, Temperature: 98.1 F, Pulse: 76 bpm, Respiratory Rate: 18 breaths/min, Blood Pressure: 163/83 mmHg. Respiratory normal breathing without difficulty. Psychiatric this patient is able to make decisions and demonstrates good insight into disease process. Alert and Oriented x 3. pleasant and cooperative. General Notes: Patient's wound did not require any sharp debridement today which is great news and overall I am actually extremely pleased with where we stand. I definitely think that we are on the right track here and this does appear to be much healthier compared to where we have been previous. I am very pleased with the help of the tissue and I feel like it also has filled in quite a bit compared to what we used to have from a depth standpoint. She is very pleased to hear this as well. Integumentary (Hair, Skin) Wound #1 status is Open. Original cause of wound was Radiation Burn. The date acquired was: 07/26/2020. The wound has been in treatment 98 weeks.  The wound is located on the Right Breast (mastectomy site). The wound measures 2.7cm length x 1.4cm width x 0.4cm depth; 2.969cm^2 area and 1.188cm^3 volume. There is Fat Layer (Subcutaneous Tissue) exposed. There is no tunneling noted, however, there is undermining starting at 10:00 and ending at 12:00 with a maximum distance of 0.4cm. There is a medium amount of serosanguineous drainage noted. The wound margin is distinct with the outline attached to the wound base. There is small (1-33%) red, pink granulation within the wound bed. There is a large (67-100%) amount of necrotic tissue within the wound bed including Adherent Slough. 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 A skin graft procedure using a bioengineered skin substitute/cellular or tissue based product was performed by Worthy Keeler, PA with the following instrument(s): Forceps and Scissors. Epicord was applied and secured with Sorbact. 6 sq cm of product was utilized and 0 sq cm was wasted. Post Application, Gauze, Allevyn was applied. A Time Out was conducted at 09:57, prior to the start of the procedure. The procedure was tolerated well. Post procedure Diagnosis Wound #1: Same as Pre-Procedure General Notes: Procedure scribed for Kelli Churn, PA by Janan Halter, RN. Plan Follow-up Appointments: Return Appointment in 1 week. - 07/16/22 @ 8:45am with Orland Jarred, RN (Room 7) Anesthetic: (In clinic) Topical Lidocaine 5% applied to wound bed (In clinic) Topical Lidocaine 4% applied to wound bed Cellular or Tissue Based Products: Cellular or Tissue Based Product Type: - Order Epicord for  application on 01/29/61=229% covered Cellular or Tissue Based Product applied to wound bed, secured with steri-strips, cover with Adaptic or Mepitel. (DO NOT REMOVE). - Epicord #1 06/11/22, #2 06/18/22, #3 06/25/22, #4 07/02/22, #5 07/09/22 Bathing/ Shower/ Hygiene: May shower with protection but do not get wound dressing(s) wet. Additional Orders / Instructions: Follow Nutritious Diet WOUND #1: - Breast (mastectomy site) Wound Laterality: Right Cleanser: Soap and Water 1 x Per Day/30 Days Discharge Instructions: May shower and wash wound with dial antibacterial soap and water prior to dressing change. Peri-Wound Care: Skin Prep (Generic) 1 x Per Day/30 Days Discharge Instructions: Use skin prep as directed Prim Dressing: Cutimed Sorbact Swab 1 x Per Day/30 Days ary Discharge Instructions: Apply to wound bed as instructed Secondary Dressing: ALLEVYN Gentle Border, 5x5 (in/in) 1 x Per Day/30 Days Discharge Instructions: Apply over primary dressing as directed. Secondary Dressing: Woven Gauze Sponges 2x2 in (Generic) 1 x Per Day/30 Days Discharge Instructions: Apply over primary dressing, fold in corners to fill the space 1. Based on what I am seeing currently I do think that the patient should continue with the [facility]. This is actually application #5 today. Overall I am extremely pleased with where we stand and I think that the depth of the wound has filled in quite substantially since we started. 2. I am also can recommend that she continue with the bordered foam dressing to cover which has done well for her up to this point. We will see patient back for reevaluation in 1 week here in the clinic. If anything worsens or changes patient will contact our office for additional recommendations. Electronic Signature(s) Signed: 07/09/2022 10:08:05 AM By: Worthy Keeler PA-C Entered By: Worthy Keeler on 07/09/2022  10:08:05 -------------------------------------------------------------------------------- SuperBill Details Patient Name: Date of Service: [name], [name] 07/09/2022 Medical Record Number: [id number] Patient Account Number: [id number] Date of Birth/Sex: Treating RN: 1951/02/14 (71 y.o. [name] Primary Care Provider: [name] Other Clinician: Referring Provider: Treating Provider/Extender: [name], [name]: 98 Diagnosis Coding ICD-10 Codes Code Description [phone] Non-pressure chronic ulcer of skin of other sites with fat layer exposed L58.9 Radiodermatitis, unspecified L59.8 Other specified disorders of the skin and subcutaneous tissue related to radiation I10 Essential (primary) hypertension Facility Procedures The patient participates with Medicare or their insurance follows the Medicare Facility Guidelines: CPT4 Code Description Modifier Quantity [phone] Epicord 2cm x 3cm - per sqcm 6 ICD-10 Diagnosis Description L98.492 Non-pressure chronic ulcer of  skin of other sites with fat layer exposed The patient participates with Medicare or  their insurance follows the Medicare Facility Guidelines: [id number] - SKIN SUB GRAFT TRNK/ARM/LEG 1 ICD-10 Diagnosis Description L98.492 Non-pressure chronic ulcer of skin of other sites with fat layer  exposed Physician Procedures : CPT4 Code Description Modifier [id number] - WC PHYS SKIN SUB GRAFT TRNK/ARM/LEG ICD-10 Diagnosis Description L98.492 Non-pressure chronic ulcer of skin of other sites with fat layer exposed Quantity: 1 Electronic Signature(s) Signed: 07/09/2022 10:08:24 AM By: Worthy Keeler PA-C Entered By: Worthy Keeler on 07/09/2022 10:08:24

## 2022-07-10 ENCOUNTER — Ambulatory Visit (INDEPENDENT_AMBULATORY_CARE_PROVIDER_SITE_OTHER): Payer: Medicare Other

## 2022-07-10 ENCOUNTER — Ambulatory Visit: Payer: Medicare Other | Attending: Rheumatology | Admitting: Rheumatology

## 2022-07-10 ENCOUNTER — Encounter: Payer: Self-pay | Admitting: Rheumatology

## 2022-07-10 VITALS — BP 163/79 | HR 76 | Resp 15 | Ht 63.5 in | Wt 193.0 lb

## 2022-07-10 DIAGNOSIS — Z8639 Personal history of other endocrine, nutritional and metabolic disease: Secondary | ICD-10-CM | POA: Diagnosis not present

## 2022-07-10 DIAGNOSIS — M25561 Pain in right knee: Secondary | ICD-10-CM | POA: Diagnosis not present

## 2022-07-10 DIAGNOSIS — M79671 Pain in right foot: Secondary | ICD-10-CM | POA: Diagnosis not present

## 2022-07-10 DIAGNOSIS — I10 Essential (primary) hypertension: Secondary | ICD-10-CM | POA: Diagnosis not present

## 2022-07-10 DIAGNOSIS — M25512 Pain in left shoulder: Secondary | ICD-10-CM | POA: Insufficient documentation

## 2022-07-10 DIAGNOSIS — M79642 Pain in left hand: Secondary | ICD-10-CM | POA: Insufficient documentation

## 2022-07-10 DIAGNOSIS — D500 Iron deficiency anemia secondary to blood loss (chronic): Secondary | ICD-10-CM | POA: Diagnosis not present

## 2022-07-10 DIAGNOSIS — D631 Anemia in chronic kidney disease: Secondary | ICD-10-CM | POA: Diagnosis not present

## 2022-07-10 DIAGNOSIS — G8929 Other chronic pain: Secondary | ICD-10-CM | POA: Insufficient documentation

## 2022-07-10 DIAGNOSIS — Z8261 Family history of arthritis: Secondary | ICD-10-CM | POA: Diagnosis not present

## 2022-07-10 DIAGNOSIS — M79672 Pain in left foot: Secondary | ICD-10-CM | POA: Diagnosis not present

## 2022-07-10 DIAGNOSIS — I890 Lymphedema, not elsewhere classified: Secondary | ICD-10-CM | POA: Insufficient documentation

## 2022-07-10 DIAGNOSIS — Z86718 Personal history of other venous thrombosis and embolism: Secondary | ICD-10-CM | POA: Diagnosis not present

## 2022-07-10 DIAGNOSIS — M79641 Pain in right hand: Secondary | ICD-10-CM

## 2022-07-10 DIAGNOSIS — Z8719 Personal history of other diseases of the digestive system: Secondary | ICD-10-CM | POA: Insufficient documentation

## 2022-07-10 DIAGNOSIS — M2550 Pain in unspecified joint: Secondary | ICD-10-CM | POA: Diagnosis not present

## 2022-07-10 DIAGNOSIS — M25562 Pain in left knee: Secondary | ICD-10-CM | POA: Diagnosis not present

## 2022-07-10 DIAGNOSIS — Z1159 Encounter for screening for other viral diseases: Secondary | ICD-10-CM | POA: Diagnosis not present

## 2022-07-10 DIAGNOSIS — C50011 Malignant neoplasm of nipple and areola, right female breast: Secondary | ICD-10-CM | POA: Diagnosis not present

## 2022-07-10 DIAGNOSIS — Z96643 Presence of artificial hip joint, bilateral: Secondary | ICD-10-CM | POA: Diagnosis not present

## 2022-07-10 DIAGNOSIS — Z8659 Personal history of other mental and behavioral disorders: Secondary | ICD-10-CM | POA: Diagnosis not present

## 2022-07-10 DIAGNOSIS — R768 Other specified abnormal immunological findings in serum: Secondary | ICD-10-CM | POA: Insufficient documentation

## 2022-07-13 DIAGNOSIS — I700 Atherosclerosis of aorta: Secondary | ICD-10-CM | POA: Diagnosis not present

## 2022-07-13 DIAGNOSIS — I10 Essential (primary) hypertension: Secondary | ICD-10-CM | POA: Diagnosis not present

## 2022-07-14 LAB — RHEUMATOID FACTOR: Rheumatoid fact SerPl-aCnc: 299 IU/mL — ABNORMAL HIGH (ref <14)

## 2022-07-14 LAB — HEPATITIS B CORE ANTIBODY, IGM: Hep B C IgM: NONREACTIVE

## 2022-07-14 LAB — URIC ACID: Uric Acid, Serum: 3.9 mg/dL (ref 2.5–7.0)

## 2022-07-14 LAB — HEPATITIS B SURFACE ANTIGEN: Hepatitis B Surface Ag: NONREACTIVE

## 2022-07-14 LAB — GLUCOSE 6 PHOSPHATE DEHYDROGENASE: G-6PDH: 17.5 U/g Hgb (ref 7.0–20.5)

## 2022-07-14 LAB — CYCLIC CITRUL PEPTIDE ANTIBODY, IGG: Cyclic Citrullin Peptide Ab: >250 UNITS — ABNORMAL HIGH

## 2022-07-14 LAB — HEPATITIS C ANTIBODY: Hepatitis C Ab: NONREACTIVE

## 2022-07-14 LAB — SEDIMENTATION RATE: Sed Rate: 33 mm/h — ABNORMAL HIGH (ref 0–30)

## 2022-07-15 NOTE — Progress Notes (Signed)
Labs are consistent with rheumatoid arthritis.  I will discuss results at the follow-up visit.

## 2022-07-16 ENCOUNTER — Encounter (HOSPITAL_BASED_OUTPATIENT_CLINIC_OR_DEPARTMENT_OTHER): Payer: Medicare Other | Attending: Physician Assistant | Admitting: Physician Assistant

## 2022-07-16 DIAGNOSIS — Z9221 Personal history of antineoplastic chemotherapy: Secondary | ICD-10-CM | POA: Diagnosis not present

## 2022-07-16 DIAGNOSIS — Z853 Personal history of malignant neoplasm of breast: Secondary | ICD-10-CM | POA: Diagnosis not present

## 2022-07-16 DIAGNOSIS — L589 Radiodermatitis, unspecified: Secondary | ICD-10-CM | POA: Insufficient documentation

## 2022-07-16 DIAGNOSIS — L98492 Non-pressure chronic ulcer of skin of other sites with fat layer exposed: Secondary | ICD-10-CM | POA: Diagnosis not present

## 2022-07-16 DIAGNOSIS — I10 Essential (primary) hypertension: Secondary | ICD-10-CM | POA: Insufficient documentation

## 2022-07-16 DIAGNOSIS — L598 Other specified disorders of the skin and subcutaneous tissue related to radiation: Secondary | ICD-10-CM | POA: Diagnosis not present

## 2022-07-16 DIAGNOSIS — Z9011 Acquired absence of right breast and nipple: Secondary | ICD-10-CM | POA: Diagnosis not present

## 2022-07-16 NOTE — Progress Notes (Signed)
[name], [name] ([id number]) Visit Report for 07/16/2022 Arrival Information Details Patient Name: Date of Service: [name], [name] 07/16/2022 8:45 A M Medical Record Number: [id number] Patient Account Number: [id number] Date of Birth/Sex: Treating RN: 07/26/1951 (71 y.o. F) Barnhart, Jodi Primary Care Provider: [name], [name] Other Clinician: Referring Provider: Treating Provider/Extender: [name], [name] [name], [name]: 99 Visit Information History Since Last Visit Added or deleted any medications: No Patient Arrived: Ambulatory Any new allergies or adverse reactions: No Arrival Time: 08:46 Had a fall or experienced change in No Accompanied By: self activities of daily living that may affect Transfer Assistance: None risk of falls: Patient Requires Transmission-Based Precautions: No Signs or symptoms of abuse/neglect since last visito No Patient Has Alerts: No Hospitalized since last visit: No Implantable device outside of the clinic excluding No cellular tissue based products placed in the center since last visit: Has Dressing in Place as Prescribed: Yes Pain Present Now: No Electronic Signature(s) Signed: 07/16/2022 4:20:32 PM By: Dick, Kimberly Entered By: Dick, Kimberly on 07/16/2022 08:46:35 -------------------------------------------------------------------------------- Encounter Discharge Information Details Patient Name: Date of Service: [name], [name] 07/16/2022 8:45 A M Medical Record Number: [id number] Patient Account Number: [id number] Date of Birth/Sex: Treating RN: 04/07/1951 (71 y.o. F) Barnhart, Jodi Primary Care Provider: [name], [name] Other Clinician: Referring Provider: Treating Provider/Extender: [name], [name] [name], [name]: 99 Encounter Discharge Information Items Post Procedure Vitals Discharge Condition: Stable Temperature (F): 97.8 Ambulatory Status: Ambulatory Pulse (bpm): 73 Discharge Destination:  Home Respiratory Rate (breaths/min): 18 Transportation: Private Auto Blood Pressure (mmHg): 152/71 Schedule Follow-up Appointment: Yes Clinical Summary of Care: Provided on 07/16/2022 Form Type Recipient Paper Patient Patient Electronic Signature(s) Signed: 07/16/2022 4:24:27 PM By: Barnhart, Jodi Entered By: Barnhart, Jodi on 07/16/2022 09:27:52 -------------------------------------------------------------------------------- Lower Extremity Assessment Details Patient Name: Date of Service: [name], [name] 07/16/2022 8:45 A M Medical Record Number: [id number] Patient Account Number: [id number] Date of Birth/Sex: Treating RN: 10/01/1951 (71 y.o. F) Barnhart, Jodi Primary Care Provider: [name], [name] Other Clinician: Referring Provider: Treating Provider/Extender: [name], [name] [name], [name]: 99 Electronic Signature(s) Signed: 07/16/2022 4:20:32 PM By: Dick, Kimberly Signed: 07/16/2022 4:24:27 PM By: Barnhart, Jodi Entered By: Dick, Kimberly on 07/16/2022 08:48:19 -------------------------------------------------------------------------------- Multi-Disciplinary Care Plan Details Patient Name: Date of Service: [name], [name] 07/16/2022 8:45 A M Medical Record Number: [id number] Patient Account Number: [id number] Date of Birth/Sex: Treating RN: 12/30/1950 (71 y.o. F) Barnhart, Jodi Primary Care Provider: [name], [name] Other Clinician: Referring Provider: Treating Provider/Extender: [name], [name] [name], [name]: 99 Multidisciplinary Care Plan reviewed with physician Active Inactive Wound/Skin Impairment Nursing Diagnoses: Impaired tissue integrity Knowledge deficit related to ulceration/compromised skin integrity Goals: Patient/caregiver will verbalize understanding of skin care regimen Date Initiated: 08/22/2020 Target Resolution Date: 08/06/2022 Goal Status: Active Ulcer/skin breakdown will have a volume reduction of  30% by week 4 Date Initiated: 08/22/2020 Date Inactivated: 10/03/2020 Target Resolution Date: 09/19/2020 Goal Status: Met Ulcer/skin breakdown will have a volume reduction of 50% by week 8 Date Initiated: 06/05/2021 Date Inactivated: 07/17/2021 Target Resolution Date: 07/03/2021 Goal Status: Unmet Unmet Reason: infection Interventions: Assess patient/caregiver ability to obtain necessary supplies Assess patient/caregiver ability to perform ulcer/skin care regimen upon admission and as needed Assess ulceration(s) every visit Treatment Activities: Skin care regimen initiated : 08/22/2020 Topical wound management initiated : 08/22/2020 Notes: 06/05/21: Wound care regimen ongoing. 06/11/22: Wound care regimen continues, applying skin sub ([place]pic[place]) Electronic Signature(s) Signed: 07/16/2022 4:24:27 PM By: Barnhart, Jodi Entered   By: Barnhart, Jodi on 07/16/2022 08:56:13 -------------------------------------------------------------------------------- Pain Assessment Details Patient Name: Date of Service: [name], [name] 07/16/2022 8:45 A M Medical Record Number: [id number] Patient Account Number: [id number] Date of Birth/Sex: Treating RN: 06/17/1951 (71 y.o. F) Barnhart, Jodi Primary Care Provider: [name], [name] Other Clinician: Referring Provider: Treating Provider/Extender: [name], [name] [name], [name]: 99 Active Problems Location of Pain Severity and Description of Pain Patient Has Paino No Site Locations Pain Management and Medication Current Pain Management: Electronic Signature(s) Signed: 07/16/2022 4:20:32 PM By: Dick, Kimberly Signed: 07/16/2022 4:24:27 PM By: Barnhart, Jodi Entered By: Dick, Kimberly on 07/16/2022 08:46:43 -------------------------------------------------------------------------------- Patient/Caregiver Education Details Patient Name: Date of Service: [name], [name] 10/4/2023andnbsp8:45 A M Medical Record Number: [id number] Patient  Account Number: [id number] Date of Birth/Gender: Treating RN: 03/16/1951 (71 y.o. F) Barnhart, Jodi Primary Care Physician: [name], [name] Other Clinician: Referring Physician: Treating Physician/Extender: [name], [name] [name], [name]: 99 Education Assessment Education Provided To: Patient Education Topics Provided Wound/Skin Impairment: Methods: Explain/Verbal, Printed Responses: State content correctly Electronic Signature(s) Signed: 07/16/2022 4:24:27 PM By: Barnhart, Jodi Entered By: Barnhart, Jodi on 07/16/2022 08:56:28 -------------------------------------------------------------------------------- Wound Assessment Details Patient Name: Date of Service: [name], [name] 07/16/2022 8:45 A M Medical Record Number: [id number] Patient Account Number: [id number] Date of Birth/Sex: Treating RN: 11/21/1950 (71 y.o. F) Barnhart, Jodi Primary Care Provider: [name], [name] Other Clinician: Referring Provider: Treating Provider/Extender: [name], [name] [name], [name]: 99 Wound Status Wound Number: 1 Primary Soft Tissue Radionecrosis Etiology: Wound Location: Right Breast (mastectomy site) Wound Open Wounding Event: Radiation Burn Status: Date Acquired: 07/26/2020 Comorbid Anemia, Lymphedema, Deep Vein Thrombosis, Hypertension, Weeks Of Treatment: 99 History: Peripheral Venous Disease, Osteoarthritis, Received Clustered Wound: No Chemotherapy, Received Radiation, Confinement Anxiety Photos Wound Measurements Length: (cm) 1.7 Width: (cm) 2.4 Depth: (cm) 0.4 Area: (cm) 3.204 Volume: (cm) 1.282 % Reduction in Area: -109.1% % Reduction in Volume: -737.9% Epithelialization: None Tunneling: No Undermining: No Wound Description Classification: Full Thickness Without Exposed Support Structures Wound Margin: Distinct, outline attached Exudate Amount: Medium Exudate Type: Serosanguineous Exudate Color: red, brown Foul Odor  After Cleansing: No Slough/Fibrino Yes Wound Bed Granulation Amount: Large (67-100%) Exposed Structure Granulation Quality: Red, Friable Fascia Exposed: No Necrotic Amount: Small (1-33%) Fat Layer (Subcutaneous Tissue) Exposed: Yes Necrotic Quality: Adherent Slough Tendon Exposed: No Muscle Exposed: No Joint Exposed: No Bone Exposed: No Periwound Skin Texture Texture Color No Abnormalities Noted: Yes No Abnormalities Noted: No Atrophie Blanche: No Moisture Cyanosis: No No Abnormalities Noted: Yes Ecchymosis: No Erythema: No Hemosiderin Staining: No Mottled: No Pallor: No Rubor: No Temperature / Pain Temperature: No Abnormality Treatment Notes Wound #1 (Breast (mastectomy site)) Wound Laterality: Right Cleanser Soap and Water Discharge Instruction: May shower and wash wound with dial antibacterial soap and water prior to dressing change. Peri-Wound Care Skin Prep Discharge Instruction: Use skin prep as directed Topical Primary Dressing Cutimed Sorbact Swab Discharge Instruction: Apply to wound bed as instructed Secondary Dressing ALLEVYN Gentle Border, 5x5 (in/in) Discharge Instruction: Apply over primary dressing as directed. Woven Gauze Sponges 2x2 in Discharge Instruction: Apply over primary dressing, fold in corners to fill the space Secured With Compression Wrap Compression Stockings Add-Ons Electronic Signature(s) Signed: 07/16/2022 4:20:32 PM By: Dick, Kimberly Signed: 07/16/2022 4:24:27 PM By: Barnhart, Jodi Entered By: Dick, Kimberly on 07/16/2022 08:56:29 -------------------------------------------------------------------------------- Vitals Details Patient Name: Date of Service: [name], [name] 07/16/2022 8:45 A M Medical Record Number: [id number] Patient Account Number: [id number] Date of Birth/Sex:   Treating RN: 03/19/1951 (71 y.o. F) Barnhart, Jodi Primary Care Provider: [name], [name] Other Clinician: Referring Provider: Treating  Provider/Extender: [name], [name] [name], [name]: 99 Vital Signs Time Taken: 08:46 Temperature (°F): 97.8 Height (in): 63 Pulse (bpm): 73 Weight (lbs): 176 Respiratory Rate (breaths/min): 18 Body Mass Index (BMI): 31.2 Blood Pressure (mmHg): 152/71 Reference Range: 80 - 120 mg / dl Electronic Signature(s) Signed: 07/16/2022 4:20:32 PM By: Dick, Kimberly Entered By: Dick, Kimberly on 07/16/2022 08:48:09 

## 2022-07-16 NOTE — Progress Notes (Addendum)
[name], [name] ([id number]) Visit Report for 07/16/2022 Chief Complaint Document Details Patient Name: Date of Service: [name] [name] 07/16/2022 8:45 A M Medical Record Number: [id number] Patient Account Number: [id number] Date of Birth/Sex: Treating RN: 1951/02/01 (71 y.o. 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 [name] Primary Care Provider: [name] Other Clinician: Referring Provider: Treating Provider/Extender: [name], [name]: 99 Cellular or Tissue Based Product Type Wound #1 Right Breast (mastectomy site) Applied to: Performed By: Physician Worthy Keeler, PA Cellular or Tissue Based Product Type: Epicord Level of Consciousness (Pre-procedure): Awake and Alert Pre-procedure Verification/Time Out Yes - 09:16 Taken: Location: trunk / arms / legs Wound Size (sq cm): 4.08 Product Size (sq cm): 6 Waste Size (sq cm): 0 Amount of Product Applied (sq cm): 6 Instrument Used: Forceps, Scissors Lot #: [id number] Expiration Date: 02/11/2027 Reconstituted: Yes Solution Type: Normal Saline Solution Amount: 58m Lot #: [id number] Expiration Date: 02/09/2025 Dressing Applied: Yes Primary Dressing: Sorbact, Gauze Response to Treatment:  Procedure was tolerated well Level of Consciousness (Post- Awake and Alert procedure): Post Procedure Diagnosis Same as Pre-procedure Notes Procedure scribed for HKelli Churn PA by JJanan Halter RN Electronic Signature(s) Signed: 07/16/2022 4:24:27 PM By: BLorrin JacksonSigned: 07/16/2022 5:41:10 PM By: SWorthy KeelerPA-C Entered By: BLorrin Jacksonon 07/16/2022 09:21:09 -------------------------------------------------------------------------------- HPI Details Patient Name: Date of Service: [name], [name] 07/16/2022 8:45 A M Medical Record Number: [id number] Account Number: [id number] of Birth/Sex: Treating RN: [birth year]((71y.o. FSue LushPrimary Care Provider: HMaryella ShiversOther Clinician: Referring Provider: Treating Provider/Extender: SZenon Mayo F[place]Weeks in Treatment: 937History of Present Illness HPI Description: 08/22/2020 upon evaluation today patient actually appears to be doing somewhat poorly in regard to her mastectomy site on the right chest wall. She had a mastectomy initially in 1998. She had chemotherapy only no radiation following. In 2002 she subsequently did undergo radiation for the first time and then subsequently in 2012 had repeat radiation after having had a finding that was consistent with a relapse of the cancer. 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 She also has radiation damage to the skin over the right chest wall where she had the mastectomy. She was [place] HMatthew Sarasin [facility] her oncologist is Dr. PBurney Gauze Subsequently I want to make sure before we delve into this more deeply that the patient does not have any issues with a return of cancer that needs to be managed by them. Obviously she does have significant scar tissue which may be benefited secondary to the soft tissue radionecrosis by hyperbaric oxygen therapy in the absence  of any recurrence of the cancer. Nonetheless she does not remember exactly when her last PET scan was but it was not too recently she tells me. 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 The patient does have  hypertension as well. She sees her oncologist next on December 6. 09/12/2020 on evaluation today patient actually appears to be doing excellent in regard to her wound under the right breast location. Currently this is measuring smaller in general seems to be doing very well. She tells me that the collagen is doing a good job and that the dressing that she has been putting on is not causing any troubles as far pulling on her skin or scar tissue is concerned all of which is excellent news. 10/03/2020 upon evaluation today patient appears to be doing well with regard to her surgical site from her mastectomy on the right breast region. She tells me that she has had very little bleeding nothing seems to be sticking badly and in general she has been extremely pleased with where things stand today. No fevers, chills, nausea, vomiting, or diarrhea. 10/24/2020 upon evaluation today patient actually appears to be doing excellent in regard to her wound. There is just a very small area still open and to be honest there was minimal slough noted on the surface of the wound nothing that requires sharp debridement. In general I feel like she is actually doing quite well and overall I am pleased. I do think we may switch to silver alginate dressing and also contemplating using a AandE ointment in order to help keep everything moist and the scar tissue region while the alginate keeps it dry enough to heal 11/14/2020 upon evaluation today patient appears to be doing well at this point in regard to her wound. I do feel like that she is making good progress again this is a very difficult region over the surgical site of the right chest wall at the site of mastectomy. Nonetheless I think that we are just a  very small area still open I think alginate is doing a good job helping to dry this up and I would recommend this such that we continue with this. 01/02/2021 on evaluation today patient's wound actually appears to be doing decently well. There does not appear to be any signs of active infection which is great news. She does have some slight slough buildup with biofilm on the surface of the wound mainly more biofilm. Nonetheless I was able to gently remove this with saline and gauze as well as a sterile Q-tip. She tolerated that today without complication. 01/30/2021 upon evaluation today patient appears to be doing well with regard to her wound although she still has quite a bit of trouble getting this to close I think the scar tissue and radiation damage is quite significant here unfortunately. There does not appear to be any signs of active infection which is great news. No fevers, chills, nausea, vomiting, or diarrhea. 02/27/2021 upon evaluation today patient appears to be doing excellent in regard to her wound. She has been tolerating the dressing changes without complication and in general I am extremely pleased with where things stand today. There does not appear to be any signs of infection which is great news. No fevers, chills, nausea, vomiting, or diarrhea. With that being said I do think that she still developing some slough buildup. I did discuss with her today the possibility of proceeding with HBO therapy. We have had this discussion before but she really is not quite committed to wanting to do that much and spend that much time in the chamber. She wants to still think about it. 03/27/2021 upon evaluation today patient appears to be doing about the same in regard to  her wound. She still developing a lot of slough buildup on the surface of the wound. I did try to clean that away to some degree today. She tolerated that without any pain or complication. Subsequently I am thinking we may  want to switch over to [facility] see if this may do better for her. Patient is in agreement with giving that a trial. 05/01/2021 upon evaluation today patient appears to be doing about the same in regard to her wounds. She has been tolerating the dressing changes without complication. Fortunately there does not appear to be any signs of active infection at this time which is great news. No fevers, chills, nausea, vomiting, or diarrhea.. 06/05/2021 upon evaluation today patient appears to be doing pretty well currently in regard to her wound at the mastectomy site right chest wall. Overall since we switch back to the alginate she tells me things have significantly improved she is much happier with where things stand currently. Fortunately there does not appear to be any signs of active infection which is great news. No fevers, chills, nausea, vomiting, or diarrhea. 07/10/2021 upon evaluation today patient unfortunately does not appear to be doing nearly as well as what she previously was. There does not appear to be any evidence of new epithelial growth she has a lot of necrotic tissue the base of the wound this is much more significant than what we previously noted. She also has been having increased pain and increased drainage. In general I am very concerned about this especially in light of the fact that she does have a history of breast cancer I want to ensure that were not looking at any cancerous type lesion at this point. Otherwise also think she does need some debridement we did do MolecuLight scanning today. 07/17/2021 upon evaluation today patient appears to be doing well with regard to her wound compared to last week although she still having some erythema I am concerned in this regard. I do think that she fortunately had a negative biopsy which is great news unfortunately she did have Pseudomonas noted as a bacteria present in the culture which I think is good and need to be addressed with  Levaquin. I Minna send that into the pharmacy today. We did repeat the MolecuLight screening. 07/31/2021 upon evaluation today patient's wound is actually showing signs of improvement which is great news. Fortunately there does not appear to be any signs of infection currently locally nor systemically and I am very pleased in that regard. With that being said the patient is having some issues here with continued necrotic tissue I think packing with the Dakin's moistened gauze dressing is still in the right leg ago. 08/14/2021 upon evaluation today patient appears to be doing well with regard to her wound all things considered I do not see any signs of significant infection which is great news. Fortunately there does not appear to be any evidence of infection which is also great news. In general I think that the patient is tolerating the dressing changes well. We been using a Dakin's moistened gauze. 08/28/2021 upon evaluation today patient appears to be doing well with regard to her wound. Worsening signs of definite improvement which is great news and overall very pleased with where we stand today. There is no signs of inflamed tissue or infection which is great as well and I think the Dakin's is doing a great job here. 09/11/2021 upon evaluation today patient appears to be doing well with regard to her wound  all things considered. I am can perform some debridement today to clear away some of the necrotic debris with that being said overall I think that she is making decently good progress here which is great news. There does not appear to be any signs of active infection also good news. That in fact is probably the best news in her mind. 09/25/2021 upon evaluation today patient appears to be doing decently well in regard to her wound. Overall I do not see any signs of infection and I think she is doing quite well. I do believe that we are making headway here although is very slow due to the scarring  and the depth of the wound this is a significantly deep wound. 10/16/2021 upon evaluation today patient appears to be doing about the same in regard to the wound on her right chest wall. Fortunately there does not appear to be any signs of active infection locally nor systemically which is great news. With that being said this still was not cleaning up quite as quickly as I would like to see. Fortunately I think that we can definitely give the Santyl another try we tried this in the past and unfortunately did not have a good result but again I think she was infected at that time as well which was part of the issue. Nonetheless I think currently that we will be able to [name] ahead and see what we can do about getting this little cleaner with the Santyl. 11/06/2021 upon evaluation today patient appears to be doing well with regard to her wound. This actually appears to be a little bit better with the Santyl I am happy in that regard. Fortunately I do not see any signs of active infection at this time. No fevers, chills, nausea, vomiting, or diarrhea. 12/04/2021 upon evaluation patient appears to be doing well with the Santyl at this point. I am very pleased with where we stand and I think that she is making good progress here. Fortunately I do not see any evidence of active infection locally or systemically at this time which is great news. No fevers, chills, nausea, vomiting, or diarrhea. 01/01/2022 upon evaluation today patient actually is making excellent progress with the Santyl. I am extremely pleased with where we stand today. I do not see any signs of infection. 01-29-2022 upon evaluation today patient appears to be doing well with regard to her wound. This is showing signs of some improvement. It is a little less deep than what it was. Size wise is also slightly smaller but again there still is a significant wound in this area. Fortunately I do not see any evidence of infection and she is doing quite  well. 02-26-2022 upon evaluation today patient appears to be doing well with regard to her wound. In fact this is actually the best that I have seen in a very long time. I do not see any evidence of active infection at this time locally or systemically which is great news and overall I think we are on the right track. Nonetheless I do believe that the patient is continuing to show evidence of improvement with the Santyl week by week. 03-26-2022 upon evaluation today patient appears to be doing well with regard to the wound on her right mastectomy site. She is actually showing signs of excellent improvement in fact there was some bleeding just with a little bit of light cleaning over the area. Fortunately I do not see any signs of active infection locally or  systemically at this time which is great news. 04-30-2022 upon evaluation today patient appears to be doing well currently in regard to her wound. She is actually showing some signs of improvement here which is great news. Still this is very very slow. Subsequently I do believe that she may benefit from the use of [place] topical antibiotics for short amount of time before applying a skin substitute I think EpiCord could be doing very well for her as far as that is concerned. I discussed that with her today. Fortunately I do not see any evidence of active infection locally or systemically at this time which is great news. No fevers, chills, nausea, vomiting, or diarrhea. 05-28-2022 upon evaluation today patient's wound is actually showing signs of significant improvement. Fortunately I do not see any evidence of infection at this time which is great news. No fevers, chills, nausea, vomiting, or diarrhea. With that being said I do believe that the patient is tolerating the dressing changes without complication. We been using the [facility] which I think is helpful. We do have approval for the [place]. 06-11-2022 upon evaluation today patient appears to be  doing well with regard to her wound she is actually here today for the first application of [place] which I think is good to be beneficial for her. I do think that we will probably be able to keep this in place without can be the one thing of question to be honest. 06-18-2022 upon evaluation today patient actually appears to be doing excellent today. She has 1 treatment of the EpiCord underway and the wound surface already looks much better. This will be EpiCord #2 today. Fortunately I think we are on the right track here. 06-25-2022 upon evaluation today patient appears to be making good progress and overall the patient seems to have tolerated EpiCord without any complication. I do feel like that the wound is improving quite significantly. This is EpiCord #3 today. 07-02-2021 upon evaluation today patient's wound is actually showing signs of improvement. I am actually very pleased with where we stand we have been seeing some improvements in size overall and I think that we are still in the right track although there is a little bit of need for sharp debridement today to clearway some of the necrotic debris and remaining EpiCord which is actually still somewhat adhered to the wound bed. Overall I am extremely pleased though with where we stand. This is EpiCord #4 today. Upon evaluation today patient appears to be doing well with regard to her wound the EpiCord is definitely helping and does seem to be improving the overall status of the wound the surface is dramatically improved compared to what it was at the start of this. The tissue is actually becoming more red than it is just a pale pink and to be honest some of the did not even appear to be pink in the start. Nonetheless I am very pleased with where we stand currently. No fevers, chills, nausea, vomiting, or diarrhea. 07-16-2022 upon evaluation today patient appears to be doing well currently in regard to her wound she is doing well with the [place]  this is application #5 today. Electronic Signature(s) Signed: 07/16/2022 9:35:47 AM By: Worthy Keeler PA-C Entered By: Worthy Keeler on 07/16/2022 09:35:46 -------------------------------------------------------------------------------- Physical Exam Details Patient Name: Date of Service: [name], [name] 07/16/2022 8:45 A M Medical Record Number: [id number] Patient Account Number: [id number] Date of Birth/Sex: Treating RN: [birth year] (71 y.o. [name] Primary Care  Provider: [name] Other Clinician: Referring Provider: Treating Provider/Extender: [name], [name]: 73 Constitutional [name] and well-hydrated in no acute distress. Respiratory normal breathing without difficulty. Psychiatric this patient is able to make decisions and demonstrates good insight into disease process. Alert and Oriented x 3. pleasant and cooperative. Notes Upon inspection patient's wound bed actually showed signs of good granulation and epithelization at this point. I did not even have to perform sharp debridement today which is good and itself she was bleeding very readily and the tissue appears to be very healthy. Overall I am extremely pleased with where we stand today. Electronic Signature(s) Signed: 07/16/2022 9:36:09 AM By: Worthy Keeler PA-C Entered By: Worthy Keeler on 07/16/2022 09:36:09 -------------------------------------------------------------------------------- Physician Orders Details Patient Name: Date of Service: [name], [name] 07/16/2022 8:45 A M Medical Record Number: [id number] Patient Account Number: [id number] Date of Birth/Sex: Treating RN: 09/20/1951 (71 y.o. [name] Primary Care Provider: [name] Other Clinician: Referring Provider: Treating Provider/Extender: [name], [name]: 36 Verbal / Phone Orders: No Diagnosis Coding ICD-10 Coding Code  Description L98.492 Non-pressure chronic ulcer of skin of other sites with fat layer exposed L58.9 Radiodermatitis, unspecified L59.8 Other specified disorders of the skin and subcutaneous tissue related to radiation I10 Essential (primary) hypertension Follow-up Appointments ppointment in 1 week. - 07/23/22 @ 9:30am with Orland Jarred, RN (Room 7) Return A Anesthetic (In clinic) Topical Lidocaine 5% applied to wound bed (In clinic) Topical Lidocaine 4% applied to wound bed Cellular or Tissue Based Products Cellular or Tissue Based Product Type: - Order Epicord for application on 10/18/24=948% covered daptic or Mepitel. (DO NOT REMOVE). - Cellular or Tissue Based Product applied to wound bed, secured with steri-strips, cover with A Epicord #1 06/11/22, #2 06/18/22, #3 06/25/22, #4 07/02/22, #5 07/09/22, #6 07/16/22 Bathing/ Shower/ Hygiene May shower with protection but do not get wound dressing(s) wet. Additional Orders / Instructions Follow Nutritious Diet Wound Treatment Wound #1 - Breast (mastectomy site) Wound Laterality: Right Cleanser: Soap and Water 1 x Per Day/30 Days Discharge Instructions: May shower and wash wound with dial antibacterial soap and water prior to dressing change. Peri-Wound Care: Skin Prep (Generic) 1 x Per Day/30 Days Discharge Instructions: Use skin prep as directed Prim Dressing: Cutimed Sorbact Swab 1 x Per Day/30 Days ary Discharge Instructions: Apply to wound bed as instructed Secondary Dressing: ALLEVYN Gentle Border, 5x5 (in/in) 1 x Per Day/30 Days Discharge Instructions: Apply over primary dressing as directed. Secondary Dressing: Woven Gauze Sponges 2x2 in (Generic) 1 x Per Day/30 Days Discharge Instructions: Apply over primary dressing, fold in corners to fill the space Electronic Signature(s) Signed: 07/16/2022 4:24:27 PM By: Lorrin Jackson Signed: 07/16/2022 5:41:10 PM By: Worthy Keeler PA-C Entered By: Lorrin Jackson on 07/16/2022  09:22:17 -------------------------------------------------------------------------------- Problem List Details Patient Name: Date of Service: [name], [name] 07/16/2022 8:45 A M Medical Record Number: [id number] Patient Account Number: [id number] Date of Birth/Sex: Treating RN: 1951-07-17 (71 y.o. [name] Primary Care Provider: [name] Other Clinician: Referring Provider: Treating Provider/Extender: [name], [name]: 99 Active Problems ICD-10 Encounter Code Description Active Date MDM Diagnosis L98.492 Non-pressure chronic ulcer of skin of other sites with fat layer exposed 08/22/2020 No Yes L58.9 Radiodermatitis, unspecified 08/22/2020 No Yes L59.8 Other specified disorders of the skin and subcutaneous tissue related to 08/22/2020 No Yes radiation I10 Essential (primary) hypertension 08/22/2020 No Yes Inactive Problems  Resolved Problems Electronic Signature(s) Signed: 07/16/2022 9:08:37 AM By: Worthy Keeler PA-C Entered By: Worthy Keeler on 07/16/2022 09:08:37 -------------------------------------------------------------------------------- Progress Note Details Patient Name: Date of Service: [name], [name] 07/16/2022 8:45 A M Medical Record Number: [id number] Patient Account Number: [id number] Date of Birth/Sex: Treating RN: September 25, 1951 (71 y.o. 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 She had a mastectomy initially in 1998. She had chemotherapy only no radiation following. In 2002 she  subsequently did undergo radiation for the first time and then subsequently in 2012 had repeat radiation after having had a finding that was consistent with a relapse of the cancer. 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 She also has radiation damage to the skin over the right chest wall where she had the mastectomy. She was referred to [place] by Dr. Matthew Saras in [facility] and her oncologist is Dr. Burney Gauze. Subsequently I want to make sure before we delve into this more deeply that the patient does not have any issues with a return of cancer that needs to be managed by them. 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 Nonetheless she does not remember exactly when her last PET scan was but it was not too recently she tells me. 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 The patient does have hypertension as well. She sees her oncologist next on December 6. 09/12/2020 on evaluation today patient actually appears to be doing excellent in regard to her wound under the right breast location. Currently this is measuring smaller in general seems to be doing very well. She tells me that the collagen is doing a good job and that the dressing that she has been putting on is not causing any troubles as far pulling on her skin or scar tissue is concerned all of which is excellent news. 10/03/2020 upon evaluation today patient appears to be doing well with regard to her surgical site from her mastectomy on the right breast region. She tells me that she has had very little bleeding nothing seems to be sticking badly and in general she has been extremely pleased with where things stand today. No fevers, chills, nausea, vomiting, or diarrhea. 10/24/2020 upon evaluation today patient actually appears  to be doing excellent in regard to her wound. There is just a very small area still open and to be honest there was minimal slough noted on the surface of the wound nothing that requires sharp debridement. In general I feel like she is actually doing quite well and overall I am pleased. I do think we may switch to silver alginate dressing and also contemplating using a AandE ointment in order to help keep everything moist and the scar tissue region while the alginate keeps it dry enough to heal 11/14/2020 upon evaluation today patient appears to be doing well at this point in regard to her wound. I do feel like that she is making good progress again this is a  very difficult region over the surgical site of the right chest wall at the site of mastectomy. Nonetheless I think that we are just a very small area still open I think alginate is doing a good job helping to dry this up and I would recommend this such that we continue with this. 01/02/2021 on evaluation today patient's wound actually appears to be doing decently well. There does not appear to be any signs of active infection which is great news. She does have some slight slough buildup with biofilm on the surface of the wound mainly more biofilm. Nonetheless I was able to gently remove this with saline and gauze as well as a sterile Q-tip. She tolerated that today without complication. 01/30/2021 upon evaluation today patient appears to be doing well with regard to her wound although she still has quite a bit of trouble getting this to close I think the scar tissue and radiation damage is quite significant here unfortunately. There does not appear to be any signs of active infection which is great news. No fevers, chills, nausea, vomiting, or diarrhea. 02/27/2021 upon evaluation today patient appears to be doing excellent in regard to her wound. She has been tolerating the dressing changes without complication and in general I am extremely pleased  with where things stand today. There does not appear to be any signs of infection which is great news. No fevers, chills, nausea, vomiting, or diarrhea. With that being said I do think that she still developing some slough buildup. I did discuss with her today the possibility of proceeding with HBO therapy. We have had this discussion before but she really is not quite committed to wanting to do that much and spend that much time in the chamber. She wants to still think about it. 03/27/2021 upon evaluation today patient appears to be doing about the same in regard to her wound. She still developing a lot of slough buildup on the surface of the wound. I did try to clean that away to some degree today. She tolerated that without any pain or complication. Subsequently I am thinking we may want to switch over to [facility] see if this may do better for her. Patient is in agreement with giving that a trial. 05/01/2021 upon evaluation today patient appears to be doing about the same in regard to her wounds. She has been tolerating the dressing changes without complication. Fortunately there does not appear to be any signs of active infection at this time which is great news. No fevers, chills, nausea, vomiting, or diarrhea.. 06/05/2021 upon evaluation today patient appears to be doing pretty well currently in regard to her wound at the mastectomy site right chest wall. Overall since we switch back to the alginate she tells me things have significantly improved she is much happier with where things stand currently. Fortunately there does not appear to be any signs of active infection which is great news. No fevers, chills, nausea, vomiting, or diarrhea. 07/10/2021 upon evaluation today patient unfortunately does not appear to be doing nearly as well as what she previously was. There does not appear to be any evidence of new epithelial growth she has a lot of necrotic tissue the base of the wound this is much more  significant than what we previously noted. She also has been having increased pain and increased drainage. In general I am very concerned about this especially in light of the fact that she does have a history of breast cancer I want to ensure  that were not looking at any cancerous type lesion at this point. Otherwise also think she does need some debridement we did do MolecuLight scanning today. 07/17/2021 upon evaluation today patient appears to be doing well with regard to her wound compared to last week although she still having some erythema I am concerned in this regard. I do think that she fortunately had a negative biopsy which is great news unfortunately she did have Pseudomonas noted as a bacteria present in the culture which I think is good and need to be addressed with Levaquin. I Minna send that into the pharmacy today. We did repeat the MolecuLight screening. 07/31/2021 upon evaluation today patient's wound is actually showing signs of improvement which is great news. Fortunately there does not appear to be any signs of infection currently locally nor systemically and I am very pleased in that regard. With that being said the patient is having some issues here with continued necrotic tissue I think packing with the Dakin's moistened gauze dressing is still in the right leg ago. 08/14/2021 upon evaluation today patient appears to be doing well with regard to her wound all things considered I do not see any signs of significant infection which is great news. Fortunately there does not appear to be any evidence of infection which is also great news. In general I think that the patient is tolerating the dressing changes well. We been using a Dakin's moistened gauze. 08/28/2021 upon evaluation today patient appears to be doing well with regard to her wound. Worsening signs of definite improvement which is great news and overall very pleased with where we stand today. There is no signs of  inflamed tissue or infection which is great as well and I think the Dakin's is doing a great job here. 09/11/2021 upon evaluation today patient appears to be doing well with regard to her wound all things considered. I am can perform some debridement today to clear away some of the necrotic debris with that being said overall I think that she is making decently good progress here which is great news. There does not appear to be any signs of active infection also good news. That in fact is probably the best news in her mind. 09/25/2021 upon evaluation today patient appears to be doing decently well in regard to her wound. Overall I do not see any signs of infection and I think she is doing quite well. I do believe that we are making headway here although is very slow due to the scarring and the depth of the wound this is a significantly deep wound. 10/16/2021 upon evaluation today patient appears to be doing about the same in regard to the wound on her right chest wall. Fortunately there does not appear to be any signs of active infection locally nor systemically which is great news. With that being said this still was not cleaning up quite as quickly as I would like to see. Fortunately I think that we can definitely give the Santyl another try we tried this in the past and unfortunately did not have a good result but again I think she was infected at that time as well which was part of the issue. Nonetheless I think currently that we will be able to [name] ahead and see what we can do about getting this little cleaner with the Santyl. 11/06/2021 upon evaluation today patient appears to be doing well with regard to her wound. This actually appears to be a little bit better with  the Santyl I am happy in that regard. Fortunately I do not see any signs of active infection at this time. No fevers, chills, nausea, vomiting, or diarrhea. 12/04/2021 upon evaluation patient appears to be doing well with the Santyl at  this point. I am very pleased with where we stand and I think that she is making good progress here. Fortunately I do not see any evidence of active infection locally or systemically at this time which is great news. No fevers, chills, nausea, vomiting, or diarrhea. 01/01/2022 upon evaluation today patient actually is making excellent progress with the Santyl. I am extremely pleased with where we stand today. I do not see any signs of infection. 01-29-2022 upon evaluation today patient appears to be doing well with regard to her wound. This is showing signs of some improvement. It is a little less deep than what it was. Size wise is also slightly smaller but again there still is a significant wound in this area. Fortunately I do not see any evidence of infection and she is doing quite well. 02-26-2022 upon evaluation today patient appears to be doing well with regard to her wound. In fact this is actually the best that I have seen in a very long time. I do not see any evidence of active infection at this time locally or systemically which is great news and overall I think we are on the right track. Nonetheless I do believe that the patient is continuing to show evidence of improvement with the Santyl week by week. 03-26-2022 upon evaluation today patient appears to be doing well with regard to the wound on her right mastectomy site. She is actually showing signs of excellent improvement in fact there was some bleeding just with a little bit of light cleaning over the area. Fortunately I do not see any signs of active infection locally or systemically at this time which is great news. 04-30-2022 upon evaluation today patient appears to be doing well currently in regard to her wound. She is actually showing some signs of improvement here which is great news. Still this is very very slow. Subsequently I do believe that she may benefit from the use of [place] topical antibiotics for short amount of time  before applying a skin substitute I think EpiCord could be doing very well for her as far as that is concerned. I discussed that with her today. Fortunately I do not see any evidence of active infection locally or systemically at this time which is great news. No fevers, chills, nausea, vomiting, or diarrhea. 05-28-2022 upon evaluation today patient's wound is actually showing signs of significant improvement. Fortunately I do not see any evidence of infection at this time which is great news. No fevers, chills, nausea, vomiting, or diarrhea. With that being said I do believe that the patient is tolerating the dressing changes without complication. We been using the [facility] which I think is helpful. We do have approval for the [place]. 06-11-2022 upon evaluation today patient appears to be doing well with regard to her wound she is actually here today for the first application of [place] which I think is good to be beneficial for her. I do think that we will probably be able to keep this in place without can be the one thing of question to be honest. 06-18-2022 upon evaluation today patient actually appears to be doing excellent today. She has 1 treatment of the EpiCord underway and the wound surface already looks much better. This will be  EpiCord #2 today. Fortunately I think we are on the right track here. 06-25-2022 upon evaluation today patient appears to be making good progress and overall the patient seems to have tolerated EpiCord without any complication. I do feel like that the wound is improving quite significantly. This is EpiCord #3 today. 07-02-2021 upon evaluation today patient's wound is actually showing signs of improvement. I am actually very pleased with where we stand we have been seeing some improvements in size overall and I think that we are still in the right track although there is a little bit of need for sharp debridement today to clearway some of the necrotic debris and remaining  EpiCord which is actually still somewhat adhered to the wound bed. Overall I am extremely pleased though with where we stand. This is EpiCord #4 today. Upon evaluation today patient appears to be doing well with regard to her wound the EpiCord is definitely helping and does seem to be improving the overall status of the wound the surface is dramatically improved compared to what it was at the start of this. The tissue is actually becoming more red than it is just a pale pink and to be honest some of the did not even appear to be pink in the start. Nonetheless I am very pleased with where we stand currently. No fevers, chills, nausea, vomiting, or diarrhea. 07-16-2022 upon evaluation today patient appears to be doing well currently in regard to her wound she is doing well with the [place] this is application #5 today. Objective Constitutional [name] and well-hydrated in no acute distress. Vitals Time Taken: 8:46 AM, Height: 63 in, Weight: 176 lbs, BMI: 31.2, Temperature: 97.8 F, Pulse: 73 bpm, Respiratory Rate: 18 breaths/min, Blood Pressure: 152/71 mmHg. Respiratory normal breathing without difficulty. Psychiatric this patient is able to make decisions and demonstrates good insight into disease process. Alert and Oriented x 3. pleasant and cooperative. General Notes: Upon inspection patient's wound bed actually showed signs of good granulation and epithelization at this point. I did not even have to perform sharp debridement today which is good and itself she was bleeding very readily and the tissue appears to be very healthy. Overall I am extremely pleased with where we stand today. Integumentary (Hair, Skin) Wound #1 status is Open. Original cause of wound was Radiation Burn. The date acquired was: 07/26/2020. The wound has been in treatment 99 weeks. The wound is located on the Right Breast (mastectomy site). The wound measures 1.7cm length x 2.4cm width x 0.4cm depth; 3.204cm^2 area  and 1.282cm^3 volume. There is Fat Layer (Subcutaneous Tissue) exposed. There is no tunneling or undermining noted. There is a medium amount of serosanguineous drainage noted. The wound margin is distinct with the outline attached to the wound base. There is large (67-100%) red, friable granulation within the wound bed. There is a small (1-33%) amount of necrotic tissue within the wound bed including Adherent Slough. The periwound skin appearance had no abnormalities noted for texture. The periwound skin appearance had no abnormalities noted for moisture. The periwound skin appearance did not exhibit: Atrophie Blanche, Cyanosis, Ecchymosis, Hemosiderin Staining, Mottled, Pallor, Rubor, Erythema. Periwound temperature was noted as No Abnormality. 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 A skin  graft procedure using a bioengineered skin substitute/cellular or tissue based product was performed by Worthy Keeler, PA with the following instrument(s): Forceps and Scissors. Epicord was applied. 6 sq cm of product was utilized and 0 sq cm was wasted. Post Application, Sorbact, Gauze was applied. A Time Out was conducted at 09:16, prior to the start of the procedure. The procedure was tolerated well. Post procedure Diagnosis Wound #1: Same as Pre-Procedure General Notes: Procedure scribed for Kelli Churn, PA by Janan Halter, RN. Plan Follow-up Appointments: Return Appointment in 1 week. - 07/23/22 @ 9:30am with Orland Jarred, RN (Room 7) Anesthetic: (In clinic) Topical Lidocaine 5% applied to wound bed (In clinic) Topical Lidocaine 4% applied to wound  bed Cellular or Tissue Based Products: Cellular or Tissue Based Product Type: - Order Epicord for application on 04[birth year]=093% covered Cellular or Tissue Based Product applied to wound bed, secured with steri-strips, cover with Adaptic or Mepitel. (DO NOT REMOVE). - Epicord #1 06/11/22, #2 06/18/22, #3 06/25/22, #4 07/02/22, #5 07/09/22, #6 07/16/22 Bathing/ Shower/ Hygiene: May shower with protection but do not get wound dressing(s) wet. Additional Orders / Instructions: Follow Nutritious Diet WOUND #1: - Breast (mastectomy site) Wound Laterality: Right Cleanser: Soap and Water 1 x Per Day/30 Days Discharge Instructions: May shower and wash wound with dial antibacterial soap and water prior to dressing change. Peri-Wound Care: Skin Prep (Generic) 1 x Per Day/30 Days Discharge Instructions: Use skin prep as directed Prim Dressing: Cutimed Sorbact Swab 1 x Per Day/30 Days ary Discharge Instructions: Apply to wound bed as instructed Secondary Dressing: ALLEVYN Gentle Border, 5x5 (in/in) 1 x Per Day/30 Days Discharge Instructions: Apply over primary dressing as directed. Secondary Dressing: Woven Gauze Sponges 2x2 in (Generic) 1 x Per Day/30 Days Discharge Instructions: Apply over primary dressing, fold in corners to fill the space 1. I am good recommend currently that we have the patient [name] ahead and continue with the recommendation for wound care measures as before and she is in agreement with the plan this includes the use of the [place] which I did [name] ahead and applied a day this is application #6. 2. I am also can recommend we continue to put the Sorbact in behind it to hold in place. 3. I would also recommend continuation with a bordered foam dressing to cover which she has done extremely well with. We will see patient back for reevaluation in 1 week here in the clinic. If anything worsens or changes patient will contact our office for additional recommendations. Electronic  Signature(s) Signed: 07/16/2022 9:36:45 AM By: Worthy Keeler PA-C Entered By: Worthy Keeler on 07/16/2022 09:36:45 -------------------------------------------------------------------------------- SuperBill Details Patient Name: Date of Service: [name], [place] [name] 07/16/2022 Medical Record Number: [id number] Patient Account Number: [id number] Date of Birth/Sex: Treating RN: 12/13/1950 (71 y.o. [name] Primary Care Provider: [name] Other Clinician: Referring Provider: Treating Provider/Extender: [name], [name]: 99 Diagnosis Coding ICD-10 Codes Code Description [phone] Non-pressure chronic ulcer of skin of other sites with fat layer exposed L58.9 Radiodermatitis, unspecified L59.8 Other specified disorders of the skin and subcutaneous tissue related to radiation I10 Essential (primary) hypertension Facility Procedures The patient participates with Medicare or their insurance follows the Medicare Facility Guidelines: CPT4 Code Description Modifier Quantity [phone] Epicord 2cm x 3cm - per sqcm 6 ICD-10 Diagnosis Description L98.492 Non-pressure chronic ulcer of  skin of other sites with fat layer exposed The patient participates with Medicare or their insurance  follows the Medicare Facility Guidelines: [id number] - SKIN SUB GRAFT TRNK/ARM/LEG 1 ICD-10 Diagnosis Description L98.492 Non-pressure chronic ulcer of skin of other sites with fat layer  exposed Physician Procedures : CPT4 Code Description Modifier [id number] - WC PHYS SKIN SUB GRAFT TRNK/ARM/LEG ICD-10 Diagnosis Description L98.492 Non-pressure chronic ulcer of skin of other sites with fat layer exposed Quantity: 1 Electronic Signature(s) Signed: 07/16/2022 9:36:50 AM By: Worthy Keeler PA-C Entered By: Worthy Keeler on 07/16/2022 09:36:50

## 2022-07-21 ENCOUNTER — Other Ambulatory Visit: Payer: Self-pay | Admitting: Family Medicine

## 2022-07-21 DIAGNOSIS — Z1231 Encounter for screening mammogram for malignant neoplasm of breast: Secondary | ICD-10-CM

## 2022-07-23 ENCOUNTER — Encounter (HOSPITAL_BASED_OUTPATIENT_CLINIC_OR_DEPARTMENT_OTHER): Payer: Medicare Other | Admitting: Physician Assistant

## 2022-07-23 DIAGNOSIS — L589 Radiodermatitis, unspecified: Secondary | ICD-10-CM | POA: Diagnosis not present

## 2022-07-23 DIAGNOSIS — I10 Essential (primary) hypertension: Secondary | ICD-10-CM | POA: Diagnosis not present

## 2022-07-23 DIAGNOSIS — L98492 Non-pressure chronic ulcer of skin of other sites with fat layer exposed: Secondary | ICD-10-CM | POA: Diagnosis not present

## 2022-07-23 DIAGNOSIS — S21001A Unspecified open wound of right breast, initial encounter: Secondary | ICD-10-CM | POA: Diagnosis not present

## 2022-07-23 DIAGNOSIS — Z9011 Acquired absence of right breast and nipple: Secondary | ICD-10-CM | POA: Diagnosis not present

## 2022-07-23 DIAGNOSIS — Z853 Personal history of malignant neoplasm of breast: Secondary | ICD-10-CM | POA: Diagnosis not present

## 2022-07-23 DIAGNOSIS — L598 Other specified disorders of the skin and subcutaneous tissue related to radiation: Secondary | ICD-10-CM | POA: Diagnosis not present

## 2022-07-23 NOTE — Progress Notes (Addendum)
SAYANA, SALLEY ([id number]) [id number] Page 1 of 10 Visit Report for 07/23/2022 Chief Complaint Document Details Patient Name: Date of Service: [name] YE 07/23/2022 9:30 A M Medical Record Number: [id number] Patient Account Number: [id number] Date of Birth/Sex: Treating RN: 07-03-1951 (71 y.o. 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 [name] Primary Care Provider: [name] Other Clinician: Referring Provider: Treating Provider/Extender: [name], [name]: 100 Cellular or Tissue Based Product Type Wound #1 Right Breast (mastectomy site) Applied to: Performed By: Physician Worthy Keeler, PA Cellular or Tissue Based Product Type: Epicord Level of Consciousness (Pre-procedure): Awake and Alert Pre-procedure Verification/Time Out Yes - 10:07 Taken: Location: trunk / arms / legs Wound Size (sq cm): 4.25 Product Size (sq cm): 6 Waste Size (sq cm): 0 Amount of Product Applied (sq cm): 6 Instrument Used: Forceps, Scissors Lot #: [id number] Expiration Date: 03/14/2027 Fenestrated: Yes Instrument: Blade Reconstituted: No Secured: Yes Secured With: Sorbact Dressing Applied: Yes Primary Dressing: Gauze Bolster Response to  Treatment: Procedure was tolerated well Level of Consciousness (Post- Awake and Alert procedure): Post Procedure Diagnosis Same as Carlus Pavlov ([id number]) [id number] Page 2 of 10 Notes Procedure scribed for Kelli Churn, PA by Janan Halter, RN Electronic Signature(s) Signed: 07/23/2022 5:12:24 PM By: Worthy Keeler PA-C Signed: 07/23/2022 5:41:25 PM By: Lorrin Jackson Entered By: Lorrin Jackson on 07/23/2022 10:11:58 -------------------------------------------------------------------------------- Debridement Details Patient Name: Date of Service: [name], [name] 07/23/2022 9:30 A M Medical Record Number: [id number] Patient Account Number: [id number] Date of Birth/Sex: Treating RN: 05/11/1951 (71 y.o. [name] Primary Care Provider: [name] Other Clinician: Referring Provider: Treating Provider/Extender: [name], [name]: 100 Debridement Performed for Assessment: Wound #1 Right Breast (mastectomy site) Performed By: Physician Worthy Keeler, PA Debridement Type: Debridement Level of Consciousness (Pre-procedure): Awake and Alert Pre-procedure Verification/Time Out Yes - 10:06 Taken: Start Time: 10:07 Pain Control: Lidocaine 4% T opical Solution T Area Debrided (L x W): otal 1.7 (cm) x 2.5 (cm) = 4.25 (cm) Tissue and other material debrided: Non-Viable, Slough, Subcutaneous, Slough Level: Skin/Subcutaneous Tissue Debridement Description: Excisional Instrument: Curette Bleeding: Minimum Hemostasis Achieved: Pressure End Time: 10:09 Response to Treatment: Procedure was tolerated well Level of Consciousness (Post- Awake and Alert procedure): Post Debridement Measurements of Total Wound Length: (cm) 1.7 Width: (cm) 2.5 Depth: (cm) 0.3 Volume: (cm) 1.001 Character of Wound/Ulcer Post Debridement: Stable Post Procedure Diagnosis Same as Pre-procedure Notes Procedure scribed for  Horald Pollen by Janan Halter, RN Electronic Signature(s) Signed: 07/23/2022 5:12:24 PM By: Worthy Keeler PA-C Signed: 07/23/2022 5:41:25 PM By: Lorrin Jackson Entered By: Lorrin Jackson on 07/23/2022 10:09:59 -------------------------------------------------------------------------------- HPI Details Patient Name: Date of Service: [name], [name] 07/23/2022 9:30 A M Medical Record Number: [id number] Patient Account Number: [id number] Date of Birth/Sex: Treating RN: 10/05/1951 (71 y.o. [address] Letta Median ([id number]) [id number] Page 3 of 10 Primary Care Provider: [name] Other Clinician: Referring Provider: Treating Provider/Extender: [name], [name] in  Treatment: 100 History of Present Illness HPI Description: 08/22/2020 upon evaluation today patient actually appears to be doing somewhat poorly in regard to her mastectomy site on the right chest wall. She had a mastectomy initially in 1998. She had chemotherapy only no radiation following. In 2002 she subsequently did undergo radiation for the first time and then subsequently in 2012 had repeat radiation after having had a finding that was consistent with a relapse of the cancer. 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 She also has radiation damage to the skin over the right chest wall where she had the mastectomy. She was referred to [place] by Dr. Matthew Saras in [facility] and her oncologist is Dr. Burney Gauze. Subsequently I want to make sure before we delve into this more deeply that the patient does not have any issues with a return of cancer that needs to be managed by them. 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 Nonetheless she does not remember exactly when her last PET scan  was but it was not too recently she tells me. 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 The patient does have hypertension as well. She sees her oncologist next on December 6. 09/12/2020 on evaluation today patient actually appears to be doing excellent in regard to her wound under the right breast location. Currently this is measuring smaller in general seems to be doing very well. She tells me that the collagen is doing a good job and that the dressing that she has been putting on is not causing any troubles as far pulling on her skin or scar tissue is concerned all of which is excellent news. 10/03/2020 upon evaluation today patient appears to be doing well with regard to her surgical site from her mastectomy on the right breast region. She tells me that she has had very little bleeding nothing seems to be sticking badly and in general she has been extremely pleased with where things stand today. No fevers, chills, nausea, vomiting, or diarrhea. 10/24/2020 upon evaluation today patient actually appears to be doing excellent in regard to her wound. There is just a very small area still open and to be honest there was minimal slough noted on the surface of the wound nothing that requires sharp debridement. In general I feel like she is actually doing quite well and overall I am pleased. I do think we may switch to silver alginate dressing and also contemplating using a AandE ointment in order to help keep everything moist and the scar tissue region while the alginate keeps it dry enough to heal 11/14/2020 upon evaluation today patient appears to be doing well at this point in regard to her wound. I do feel like that she is making good progress again this is a very difficult region over the surgical site of the right chest wall at the site of mastectomy. Nonetheless I think that we are just a very small area still open I think alginate is doing a good job helping to dry this up and I would  recommend this such that we continue with this. 01/02/2021 on evaluation today patient's wound actually appears to be doing decently well. There does not appear to be any signs of active infection which is great news. She does have some slight slough  buildup with biofilm on the surface of the wound mainly more biofilm. Nonetheless I was able to gently remove this with saline and gauze as well as a sterile Q-tip. She tolerated that today without complication. 01/30/2021 upon evaluation today patient appears to be doing well with regard to her wound although she still has quite a bit of trouble getting this to close I think the scar tissue and radiation damage is quite significant here unfortunately. There does not appear to be any signs of active infection which is great news. No fevers, chills, nausea, vomiting, or diarrhea. 02/27/2021 upon evaluation today patient appears to be doing excellent in regard to her wound. She has been tolerating the dressing changes without complication and in general I am extremely pleased with where things stand today. There does not appear to be any signs of infection which is great news. No fevers, chills, nausea, vomiting, or diarrhea. With that being said I do think that she still developing some slough buildup. I did discuss with her today the possibility of proceeding with HBO therapy. We have had this discussion before but she really is not quite committed to wanting to do that much and spend that much time in the chamber. She wants to still think about it. 03/27/2021 upon evaluation today patient appears to be doing about the same in regard to her wound. She still developing a lot of slough buildup on the surface of the wound. I did try to clean that away to some degree today. She tolerated that without any pain or complication. Subsequently I am thinking we may want to switch over to [facility] see if this may do better for her. Patient is in agreement with giving that  a trial. 05/01/2021 upon evaluation today patient appears to be doing about the same in regard to her wounds. She has been tolerating the dressing changes without complication. Fortunately there does not appear to be any signs of active infection at this time which is great news. No fevers, chills, nausea, vomiting, or diarrhea.. 06/05/2021 upon evaluation today patient appears to be doing pretty well currently in regard to her wound at the mastectomy site right chest wall. Overall since we switch back to the alginate she tells me things have significantly improved she is much happier with where things stand currently. Fortunately there does not appear to be any signs of active infection which is great news. No fevers, chills, nausea, vomiting, or diarrhea. 07/10/2021 upon evaluation today patient unfortunately does not appear to be doing nearly as well as what she previously was. There does not appear to be any evidence of new epithelial growth she has a lot of necrotic tissue the base of the wound this is much more significant than what we previously noted. She also has been having increased pain and increased drainage. In general I am very concerned about this especially in light of the fact that she does have a history of breast cancer I want to ensure that were not looking at any cancerous type lesion at this point. Otherwise also think she does need some debridement we did do MolecuLight scanning today. 07/17/2021 upon evaluation today patient appears to be doing well with regard to her wound compared to last week although she still having some erythema I am concerned in this regard. I do think that she fortunately had a negative biopsy which is great news unfortunately she did have Pseudomonas noted as a bacteria present in the culture which I think is good  and need to be addressed with Levaquin. I Minna send that into the pharmacy today. We did repeat the MolecuLight screening. 07/31/2021 upon  evaluation today patient's wound is actually showing signs of improvement which is great news. Fortunately there does not appear to be any signs of infection currently locally nor systemically and I am very pleased in that regard. With that being said the patient is having some issues here with continued necrotic tissue I think packing with the Dakin's moistened gauze dressing is still in the right leg ago. 08/14/2021 upon evaluation today patient appears to be doing well with regard to her wound all things considered I do not see any signs of significant infection which is great news. Fortunately there does not appear to be any evidence of infection which is also great news. In general I think that the patient is tolerating the dressing changes well. We been using a Dakin's moistened gauze. 08/28/2021 upon evaluation today patient appears to be doing well with regard to her wound. Worsening signs of definite improvement which is great news and overall very pleased with where we stand today. There is no signs of inflamed tissue or infection which is great as well and I think the Dakin's is doing a great job here. 09/11/2021 upon evaluation today patient appears to be doing well with regard to her wound all things considered. I am can perform some debridement today to clear away some of the necrotic debris with that being said overall I think that she is making decently good progress here which is great news. There does not appear to be any signs of active infection also good news. That in fact is probably the best news in her mind. 09/25/2021 upon evaluation today patient appears to be doing decently well in regard to her wound. Overall I do not see any signs of infection and I think she is doing quite well. I do believe that we are making headway here although is very slow due to the scarring and the depth of the wound this is a significantly deep wound. NAKEETA, SEBASTIANI ([id number])  [id number] Page 4 of 10 10/16/2021 upon evaluation today patient appears to be doing about the same in regard to the wound on her right chest wall. Fortunately there does not appear to be any signs of active infection locally nor systemically which is great news. With that being said this still was not cleaning up quite as quickly as I would like to see. Fortunately I think that we can definitely give the Santyl another try we tried this in the past and unfortunately did not have a good result but again I think she was infected at that time as well which was part of the issue. Nonetheless I think currently that we will be able to go ahead and see what we can do about getting this little cleaner with the Santyl. 11/06/2021 upon evaluation today patient appears to be doing well with regard to her wound. This actually appears to be a little bit better with the Santyl I am happy in that regard. Fortunately I do not see any signs of active infection at this time. No fevers, chills, nausea, vomiting, or diarrhea. 12/04/2021 upon evaluation patient appears to be doing well with the Santyl at this point. I am very pleased with where we stand and I think that she is making good progress here. Fortunately I do not see any evidence of active infection locally or systemically at this time which is great  news. No fevers, chills, nausea, vomiting, or diarrhea. 01/01/2022 upon evaluation today patient actually is making excellent progress with the Santyl. I am extremely pleased with where we stand today. I do not see any signs of infection. 01-29-2022 upon evaluation today patient appears to be doing well with regard to her wound. This is showing signs of some improvement. It is a little less deep than what it was. Size wise is also slightly smaller but again there still is a significant wound in this area. Fortunately I do not see any evidence of infection and she is doing quite  well. 02-26-2022 upon evaluation today patient appears to be doing well with regard to her wound. In fact this is actually the best that I have seen in a very long time. I do not see any evidence of active infection at this time locally or systemically which is great news and overall I think we are on the right track. Nonetheless I do believe that the patient is continuing to show evidence of improvement with the Santyl week by week. 03-26-2022 upon evaluation today patient appears to be doing well with regard to the wound on her right mastectomy site. She is actually showing signs of excellent improvement in fact there was some bleeding just with a little bit of light cleaning over the area. Fortunately I do not see any signs of active infection locally or systemically at this time which is great news. 04-30-2022 upon evaluation today patient appears to be doing well currently in regard to her wound. She is actually showing some signs of improvement here which is great news. Still this is very very slow. Subsequently I do believe that she may benefit from the use of [place]stone topical antibiotics for short amount of time before applying a skin substitute I think EpiCord could be doing very well for her as far as that is concerned. I discussed that with her today. Fortunately I do not see any evidence of active infection locally or systemically at this time which is great news. No fevers, chills, nausea, vomiting, or diarrhea. 05-28-2022 upon evaluation today patient's wound is actually showing signs of significant improvement. Fortunately I do not see any evidence of infection at this time which is great news. No fevers, chills, nausea, vomiting, or diarrhea. With that being said I do believe that the patient is tolerating the dressing changes without complication. We been using the [facility] which I think is helpful. We do have approval for the Camak. 06-11-2022 upon evaluation today patient appears to be  doing well with regard to her wound she is actually here today for the first application of [employer] which I think is good to be beneficial for her. I do think that we will probably be able to keep this in place without can be the one thing of question to be honest. 06-18-2022 upon evaluation today patient actually appears to be doing excellent today. She has 1 treatment of the EpiCord underway and the wound surface already looks much better. This will be EpiCord #2 today. Fortunately I think we are on the right track here. 06-25-2022 upon evaluation today patient appears to be making good progress and overall the patient seems to have tolerated EpiCord without any complication. I do feel like that the wound is improving quite significantly. This is EpiCord #3 today. 07-02-2021 upon evaluation today patient's wound is actually showing signs of improvement. I am actually very pleased with where we stand we have been seeing some improvements in  size overall and I think that we are still in the right track although there is a little bit of need for sharp debridement today to clearway some of the necrotic debris and remaining EpiCord which is actually still somewhat adhered to the wound bed. Overall I am extremely pleased though with where we stand. This is EpiCord #4 today. Upon evaluation today patient appears to be doing well with regard to her wound the EpiCord is definitely helping and does seem to be improving the overall status of the wound the surface is dramatically improved compared to what it was at the start of this. The tissue is actually becoming more red than it is just a pale pink and to be honest some of the did not even appear to be pink in the start. Nonetheless I am very pleased with where we stand currently. No fevers, chills, nausea, vomiting, or diarrhea. 07-16-2022 upon evaluation today patient appears to be doing well currently in regard to her wound she is doing well with the [place]  this is application #6 today. 07-23-2022 upon evaluation today patient's wound actually showing signs of excellent improvement which is great news. Fortunately I think were making progress again this is a very scarred area which now has a pretty good vascular supply which is great news. Overall I think that she is making great progress. This is EpiCord #7 Electrical engineer) Signed: 07/23/2022 10:13:28 AM By: Worthy Keeler PA-C Entered By: Worthy Keeler on 07/23/2022 10:13:28 -------------------------------------------------------------------------------- Physical Exam Details Patient Name: Date of Service: [name], [name] 07/23/2022 9:30 A M Medical Record Number: [id number] Patient Account Number: [id number] Date of Birth/Sex: Treating RN: 07-Apr-1951 (71 y.o. F) Primary Care Provider: [name] Other Clinician: Referring Provider: Treating Provider/Extender: [name], [name]: 100 [place] ([id number]) [id number] Page 5 of 10 Constitutional [name] and well-hydrated in no acute distress. Respiratory normal breathing without difficulty. Psychiatric this patient is able to make decisions and demonstrates good insight into disease process. Alert and Oriented x 3. pleasant and cooperative. Notes Upon inspection patient's wound bed actually showed signs of excellent granulation and epithelization at this point. I did have to perform a little bit of debridement just upon. Surface for reapplication of the EpiCord and the patient tolerated that without complication. Overall I am very pleased in that regard. Electronic Signature(s) Signed: 07/23/2022 10:14:16 AM By: Worthy Keeler PA-C Entered By: Worthy Keeler on 07/23/2022 10:14:16 -------------------------------------------------------------------------------- Physician Orders Details Patient Name: Date of Service: [name], [name] 07/23/2022  9:30 A M Medical Record Number: [id number] Patient Account Number: [id number] Date of Birth/Sex: Treating RN: [birth year] (71 y.o. [name] Primary Care Provider: [name] Other Clinician: Referring Provider: Treating Provider/Extender: [name], [name]: 100 Verbal / Phone Orders: No Diagnosis Coding ICD-10 Coding Code Description [phone] Non-pressure chronic ulcer of skin of other sites with fat layer exposed L58.9 Radiodermatitis, unspecified L59.8 Other specified disorders of the skin and subcutaneous tissue related to radiation I10 Essential (primary) hypertension Follow-up Appointments ppointment in 1 week. - with Orland Jarred, RN (Room 7) Return A Anesthetic (In clinic) Topical Lidocaine 5% applied to wound bed (In clinic) Topical Lidocaine 4% applied to wound bed Cellular or Tissue Based Products Cellular or Tissue Based Product Type: - Order Epicord for application on 11/14/52=270% covered daptic or Mepitel. (DO NOT REMOVE). - Cellular or Tissue Based Product applied to wound bed, secured with steri-strips, cover with  A Epicord #1 06/11/22, #2 06/18/22, #3 06/25/22, #4 07/02/22, #5 07/09/22, #6 07/16/22, Epicord #7 07/23/22 Bathing/ Shower/ Hygiene May shower with protection but do not get wound dressing(s) wet. Additional Orders / Instructions Follow Nutritious Diet Wound Treatment Wound #1 - Breast (mastectomy site) Wound Laterality: Right Cleanser: Soap and Water 1 x Per Day/30 Days Discharge Instructions: May shower and wash wound with dial antibacterial soap and water prior to dressing change. Peri-Wound Care: Skin Prep (Generic) 1 x Per Day/30 Days Discharge Instructions: Use skin prep as directed Prim Dressing: Cutimed Sorbact Swab 1 x Per Day/30 Days ary Discharge Instructions: Apply to wound bed as instructed Secondary Dressing: ALLEVYN Gentle Border, 5x5 (in/in) 1 x Per Day/30 Days Discharge Instructions: Apply  over primary dressing as directed. KASEY, HANSELL ([id number]) [id number] Page 6 of 10 Secondary Dressing: Woven Gauze Sponges 2x2 in (Generic) 1 x Per Day/30 Days Discharge Instructions: Apply over primary dressing, fold in corners to fill the space Electronic Signature(s) Signed: 07/23/2022 5:12:24 PM By: Worthy Keeler PA-C Signed: 07/23/2022 5:41:25 PM By: Lorrin Jackson Entered By: Lorrin Jackson on 07/23/2022 10:12:39 -------------------------------------------------------------------------------- Problem List Details Patient Name: Date of Service: [name], [name] 07/23/2022 9:30 A M Medical Record Number: [id number] Patient Account Number: [id number] Date of Birth/Sex: Treating RN: 09-07-1951 (71 y.o. F) Primary Care Provider: [name] Other Clinician: Referring Provider: Treating Provider/Extender: [name], [name]: 100 Active Problems ICD-10 Encounter Code Description Active Date MDM Diagnosis L98.492 Non-pressure chronic ulcer of skin of other sites with fat layer exposed 08/22/2020 No Yes L58.9 Radiodermatitis, unspecified 08/22/2020 No Yes L59.8 Other specified disorders of the skin and subcutaneous tissue related to 08/22/2020 No Yes radiation I10 Essential (primary) hypertension 08/22/2020 No Yes Inactive Problems Resolved Problems Electronic Signature(s) Signed: 07/23/2022 9:31:42 AM By: Worthy Keeler PA-C Entered By: Worthy Keeler on 07/23/2022 09:31:42 -------------------------------------------------------------------------------- Progress Note Details Patient Name: Date of Service: [name], [name] 07/23/2022 9:30 A M Medical Record Number: [id number] Patient Account Number: [id number] Date of Birth/Sex: Treating RN: [birth year] (71 y.o. 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 She had a mastectomy initially in 1998. She had chemotherapy only no radiation following. In 2002 she subsequently did undergo radiation for the first time and then subsequently in 2012 had repeat radiation after having had a finding that was consistent with a relapse of the cancer. 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 She also has radiation damage to the skin over the right chest wall where she had the mastectomy. She was referred to [place] by Dr. Matthew Saras in [facility] and her oncologist is Dr. Burney Gauze. Subsequently I want to make sure before we delve into this more deeply that the patient does not have any issues with a return of cancer that needs to be managed by them. 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 Nonetheless she does not remember exactly when her last PET scan  was but it was not too recently she tells me. 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 The patient does have hypertension as well. She sees her oncologist next on December 6. 09/12/2020 on evaluation today patient actually appears to be doing excellent in regard to her wound under the right breast location. Currently this is measuring smaller in general seems to be doing very well. She tells me that the collagen is doing a good job and that the dressing that she has been putting on is not causing any troubles as far  pulling on her skin or scar tissue is concerned all of which is excellent news. 10/03/2020 upon evaluation today patient appears to be doing well with regard to her surgical site from her mastectomy on the right breast region. She tells me that she has had very little bleeding nothing seems to be sticking badly and in general she has been extremely pleased with where things stand today. No fevers, chills, nausea, vomiting, or diarrhea. 10/24/2020 upon evaluation today patient actually appears to be doing excellent in regard to her wound. There is just a very small area still open and to be honest there was minimal slough noted on the surface of the wound nothing that requires sharp debridement. In general I feel like she is actually doing quite well and overall I am pleased. I do think we may switch to silver alginate dressing and also contemplating using a AandE ointment in order to help keep everything moist and the scar tissue region while the alginate keeps it dry enough to heal 11/14/2020 upon evaluation today patient appears to be doing well at this point in regard to her wound. I do feel like that she is making good progress again this is a very difficult region over the surgical site of the right chest wall at the site of mastectomy. Nonetheless I think that we are just a very small area still open I think alginate is doing a good job helping to dry this up and I would recommend this such that we continue with this. 01/02/2021 on evaluation today patient's wound actually appears to be doing decently well. There does not appear to be any signs of active infection which is great news. She does have some slight slough buildup with biofilm on the surface of the wound mainly more biofilm. Nonetheless I was able to gently remove this with saline and gauze as well as a sterile Q-tip. She tolerated that today without complication. 01/30/2021 upon evaluation today patient appears to be doing well with regard  to her wound although she still has quite a bit of trouble getting this to close I think the scar tissue and radiation damage is quite significant here unfortunately. There does not appear to be any signs of active infection which is great news. No fevers, chills, nausea, vomiting, or diarrhea. 02/27/2021 upon evaluation today patient appears to be doing excellent in regard to her wound. She has been tolerating the dressing changes without complication and in general I am extremely pleased with where things stand today. There does not appear to be any signs of infection which is great news. No fevers, chills, nausea, vomiting, or diarrhea. With that being said I do think that she still developing some slough buildup. I did discuss with her today the possibility of proceeding with HBO therapy. We have had this discussion before but she really is not quite committed to wanting to do that much and  spend that much time in the chamber. She wants to still think about it. 03/27/2021 upon evaluation today patient appears to be doing about the same in regard to her wound. She still developing a lot of slough buildup on the surface of the wound. I did try to clean that away to some degree today. She tolerated that without any pain or complication. Subsequently I am thinking we may want to switch over to [facility] see if this may do better for her. Patient is in agreement with giving that a trial. 05/01/2021 upon evaluation today patient appears to be doing about the same in regard to her wounds. She has been tolerating the dressing changes without complication. Fortunately there does not appear to be any signs of active infection at this time which is great news. No fevers, chills, nausea, vomiting, or diarrhea.. 06/05/2021 upon evaluation today patient appears to be doing pretty well currently in regard to her wound at the mastectomy site right chest wall. Overall since we switch back to the alginate she tells me  things have significantly improved she is much happier with where things stand currently. Fortunately there does not appear to be any signs of active infection which is great news. No fevers, chills, nausea, vomiting, or diarrhea. 07/10/2021 upon evaluation today patient unfortunately does not appear to be doing nearly as well as what she previously was. There does not appear to be any evidence of new epithelial growth she has a lot of necrotic tissue the base of the wound this is much more significant than what we previously noted. She also has been having increased pain and increased drainage. In general I am very concerned about this especially in light of the fact that she does have a history of breast cancer I want to ensure that were not looking at any cancerous type lesion at this point. Otherwise also think she does need some debridement we did do MolecuLight scanning today. 07/17/2021 upon evaluation today patient appears to be doing well with regard to her wound compared to last week although she still having some erythema I am concerned in this regard. I do think that she fortunately had a negative biopsy which is great news unfortunately she did have Pseudomonas noted as a bacteria present in the culture which I think is good and need to be addressed with Levaquin. I Minna send that into the pharmacy today. We did repeat the MolecuLight screening. 07/31/2021 upon evaluation today patient's wound is actually showing signs of improvement which is great news. Fortunately there does not appear to be any signs of infection currently locally nor systemically and I am very pleased in that regard. With that being said the patient is having some issues here with continued necrotic tissue I think packing with the Dakin's moistened gauze dressing is still in the right leg ago. 08/14/2021 upon evaluation today patient appears to be doing well with regard to her wound all things considered I do not see any  signs of significant infection which is great news. Fortunately there does not appear to be any evidence of infection which is also great news. In general I think that the patient is tolerating the dressing changes well. We been using a Dakin's moistened gauze. 08/28/2021 upon evaluation today patient appears to be doing well with regard to her wound. Worsening signs of definite improvement which is great news and overall very pleased with where we stand today. There is no signs of inflamed tissue or infection which is  great as well and I think the Dakin's is doing a great job here. 09/11/2021 upon evaluation today patient appears to be doing well with regard to her wound all things considered. I am can perform some debridement today to clear away some of the necrotic debris with that being said overall I think that she is making decently good progress here which is great news. There does not appear to be any signs of active infection also good news. That in fact is probably the best news in her mind. 09/25/2021 upon evaluation today patient appears to be doing decently well in regard to her wound. Overall I do not see any signs of infection and I think she TAMINA, CYPHERS ([id number]) [id number] Page 8 of 10 is doing quite well. I do believe that we are making headway here although is very slow due to the scarring and the depth of the wound this is a significantly deep wound. 10/16/2021 upon evaluation today patient appears to be doing about the same in regard to the wound on her right chest wall. Fortunately there does not appear to be any signs of active infection locally nor systemically which is great news. With that being said this still was not cleaning up quite as quickly as I would like to see. Fortunately I think that we can definitely give the Santyl another try we tried this in the past and unfortunately did not have a good result but again I think she was infected  at that time as well which was part of the issue. Nonetheless I think currently that we will be able to go ahead and see what we can do about getting this little cleaner with the Santyl. 11/06/2021 upon evaluation today patient appears to be doing well with regard to her wound. This actually appears to be a little bit better with the Santyl I am happy in that regard. Fortunately I do not see any signs of active infection at this time. No fevers, chills, nausea, vomiting, or diarrhea. 12/04/2021 upon evaluation patient appears to be doing well with the Santyl at this point. I am very pleased with where we stand and I think that she is making good progress here. Fortunately I do not see any evidence of active infection locally or systemically at this time which is great news. No fevers, chills, nausea, vomiting, or diarrhea. 01/01/2022 upon evaluation today patient actually is making excellent progress with the Santyl. I am extremely pleased with where we stand today. I do not see any signs of infection. 01-29-2022 upon evaluation today patient appears to be doing well with regard to her wound. This is showing signs of some improvement. It is a little less deep than what it was. Size wise is also slightly smaller but again there still is a significant wound in this area. Fortunately I do not see any evidence of infection and she is doing quite well. 02-26-2022 upon evaluation today patient appears to be doing well with regard to her wound. In fact this is actually the best that I have seen in a very long time. I do not see any evidence of active infection at this time locally or systemically which is great news and overall I think we are on the right track. Nonetheless I do believe that the patient is continuing to show evidence of improvement with the Santyl week by week. 03-26-2022 upon evaluation today patient appears to be doing well with regard to the wound on her right mastectomy site. She  is actually  showing signs of excellent improvement in fact there was some bleeding just with a little bit of light cleaning over the area. Fortunately I do not see any signs of active infection locally or systemically at this time which is great news. 04-30-2022 upon evaluation today patient appears to be doing well currently in regard to her wound. She is actually showing some signs of improvement here which is great news. Still this is very very slow. Subsequently I do believe that she may benefit from the use of Keystone topical antibiotics for short amount of time before applying a skin substitute I think EpiCord could be doing very well for her as far as that is concerned. I discussed that with her today. Fortunately I do not see any evidence of active infection locally or systemically at this time which is great news. No fevers, chills, nausea, vomiting, or diarrhea. 05-28-2022 upon evaluation today patient's wound is actually showing signs of significant improvement. Fortunately I do not see any evidence of infection at this time which is great news. No fevers, chills, nausea, vomiting, or diarrhea. With that being said I do believe that the patient is tolerating the dressing changes without complication. We been using the [facility] which I think is helpful. We do have approval for the [place]. 06-11-2022 upon evaluation today patient appears to be doing well with regard to her wound she is actually here today for the first application of [place] which I think is good to be beneficial for her. I do think that we will probably be able to keep this in place without can be the one thing of question to be honest. 06-18-2022 upon evaluation today patient actually appears to be doing excellent today. She has 1 treatment of the EpiCord underway and the wound surface already looks much better. This will be EpiCord #2 today. Fortunately I think we are on the right track here. 06-25-2022 upon evaluation today patient  appears to be making good progress and overall the patient seems to have tolerated EpiCord without any complication. I do feel like that the wound is improving quite significantly. This is EpiCord #3 today. 07-02-2021 upon evaluation today patient's wound is actually showing signs of improvement. I am actually very pleased with where we stand we have been seeing some improvements in size overall and I think that we are still in the right track although there is a little bit of need for sharp debridement today to clearway some of the necrotic debris and remaining EpiCord which is actually still somewhat adhered to the wound bed. Overall I am extremely pleased though with where we stand. This is EpiCord #4 today. Upon evaluation today patient appears to be doing well with regard to her wound the EpiCord is definitely helping and does seem to be improving the overall status of the wound the surface is dramatically improved compared to what it was at the start of this. The tissue is actually becoming more red than it is just a pale pink and to be honest some of the did not even appear to be pink in the start. Nonetheless I am very pleased with where we stand currently. No fevers, chills, nausea, vomiting, or diarrhea. 07-16-2022 upon evaluation today patient appears to be doing well currently in regard to her wound she is doing well with the Dixie this is application #6 today. 07-23-2022 upon evaluation today patient's wound actually showing signs of excellent improvement which is great news. Fortunately I think were making  progress again this is a very scarred area which now has a pretty good vascular supply which is great news. Overall I think that she is making great progress. This is EpiCord #7 today Objective Constitutional [name] and well-hydrated in no acute distress. Vitals Time Taken: 9:28 AM, Height: 63 in, Weight: 176 lbs, BMI: 31.2, Temperature: 98 F, Pulse: 74 bpm, Respiratory  Rate: 18 breaths/min, Blood Pressure: 149/79 mmHg. Respiratory normal breathing without difficulty. Psychiatric this patient is able to make decisions and demonstrates good insight into disease process. Alert and Oriented x 3. pleasant and cooperative. General Notes: Upon inspection patient's wound bed actually showed signs of excellent granulation and epithelization at this point. I did have to perform a little JUANETTE, URIZAR ([id number]) [id number] Page 9 of 10 bit of debridement just upon. Surface for reapplication of the EpiCord and the patient tolerated that without complication. Overall I am very pleased in that regard. Integumentary (Hair, Skin) Wound #1 status is Open. Original cause of wound was Radiation Burn. The date acquired was: 07/26/2020. The wound has been in treatment 100 weeks. The wound is located on the Right Breast (mastectomy site). The wound measures 1.7cm length x 2.5cm width x 0.3cm depth; 3.338cm^2 area and 1.001cm^3 volume. There is Fat Layer (Subcutaneous Tissue) exposed. There is no tunneling or undermining noted. There is a medium amount of serosanguineous drainage noted. The wound margin is distinct with the outline attached to the wound base. There is large (67-100%) red, friable granulation within the wound bed. There is a small (1-33%) amount of necrotic tissue within the wound bed including Adherent Slough. The periwound skin appearance had no abnormalities noted for texture. The periwound skin appearance had no abnormalities noted for moisture. The periwound skin appearance did not exhibit: Atrophie Blanche, Cyanosis, Ecchymosis, Hemosiderin Staining, Mottled, Pallor, Rubor, Erythema. Periwound temperature was noted as No Abnormality. Assessment Active Problems ICD-10 Non-pressure chronic ulcer of skin of other sites with fat layer exposed Radiodermatitis, unspecified Other specified disorders of the skin and subcutaneous tissue  related to radiation Essential (primary) hypertension Procedures Wound #1 Pre-procedure diagnosis of Wound #1 is a Soft Tissue Radionecrosis located on the Right Breast (mastectomy site) . There was a Excisional Skin/Subcutaneous Tissue Debridement with a total area of 4.25 sq cm performed by Worthy Keeler, PA. With the following instrument(s): Curette to remove Non-Viable tissue/material. Material removed includes Subcutaneous Tissue and Slough and after achieving pain control using Lidocaine 4% Topical Solution. No specimens were taken. A time out was conducted at 10:06, prior to the start of the procedure. A Minimum amount of bleeding was controlled with Pressure. The procedure was tolerated well. Post Debridement Measurements: 1.7cm length x 2.5cm width x 0.3cm depth; 1.001cm^3 volume. Character of Wound/Ulcer Post Debridement is stable. Post procedure Diagnosis Wound #1: Same as Pre-Procedure General Notes: Procedure scribed for Horald Pollen by Janan Halter, RN. Pre-procedure diagnosis of Wound #1 is a Soft Tissue Radionecrosis located on the Right Breast (mastectomy site). A skin graft procedure using a bioengineered skin substitute/cellular or tissue based product was performed by Worthy Keeler, PA with the following instrument(s): Forceps and Scissors. Epicord was applied and secured with Sorbact. 6 sq cm of product was utilized and 0 sq cm was wasted. Post Application, Gauze Bolster was applied. A Time Out was conducted at 10:07, prior to the start of the procedure. The procedure was tolerated well. Post procedure Diagnosis Wound #1: Same as Pre-Procedure General Notes: Procedure scribed for Kelli Churn, PA by Janan Halter,  RN. Plan Follow-up Appointments: Return Appointment in 1 week. - with Orland Jarred, RN (Room 7) Anesthetic: (In clinic) Topical Lidocaine 5% applied to wound bed (In clinic) Topical Lidocaine 4% applied to wound bed Cellular or Tissue Based Products: Cellular or  Tissue Based Product Type: - Order Epicord for application on 9/76/73=419% covered Cellular or Tissue Based Product applied to wound bed, secured with steri-strips, cover with Adaptic or Mepitel. (DO NOT REMOVE). - Epicord #1 06/11/22, #2 06/18/22, #3 06/25/22, #4 07/02/22, #5 07/09/22, #6 07/16/22, Epicord #7 07/23/22 Bathing/ Shower/ Hygiene: May shower with protection but do not get wound dressing(s) wet. Additional Orders / Instructions: Follow Nutritious Diet WOUND #1: - Breast (mastectomy site) Wound Laterality: Right Cleanser: Soap and Water 1 x Per Day/30 Days Discharge Instructions: May shower and wash wound with dial antibacterial soap and water prior to dressing change. Peri-Wound Care: Skin Prep (Generic) 1 x Per Day/30 Days Discharge Instructions: Use skin prep as directed Prim Dressing: Cutimed Sorbact Swab 1 x Per Day/30 Days ary Discharge Instructions: Apply to wound bed as instructed Secondary Dressing: ALLEVYN Gentle Border, 5x5 (in/in) 1 x Per Day/30 Days Discharge Instructions: Apply over primary dressing as directed. Secondary Dressing: Woven Gauze Sponges 2x2 in (Generic) 1 x Per Day/30 Days Discharge Instructions: Apply over primary dressing, fold in corners to fill the space 1. Based on what I am seeing I do believe that the patient is continue to make good progress here. I do not see any signs of active infection locally nor systemically which is excellent news. 2. I am also can recommend that we have the patient continue to utilize the [place]ell which I feel like is doing a good job here. I did apply this today this is application #7. 3. Following the [place] will discuss where we can go next as far as dressings are concerned but I definitely believe that this has made a dramatic NIOBE, DICK ([id number]) [id number] Page 10 of 10 improvement in the health of the tissue whereas before it was extremely scarred and really poorly vascularized. Were  seeing much more vascular prominence at this point and again the tissue is much healthier I definitely think we are on the right track. We will see patient back for reevaluation in 1 week here in the clinic. If anything worsens or changes patient will contact our office for additional recommendations. Electronic Signature(s) Signed: 07/23/2022 10:15:17 AM By: Worthy Keeler PA-C Entered By: Worthy Keeler on 07/23/2022 10:15:17 -------------------------------------------------------------------------------- SuperBill Details Patient Name: Date of Service: [name], [name] 07/23/2022 Medical Record Number: [id number] Patient Account Number: [id number] Date of Birth/Sex: Treating RN: 01-Jul-1951 (72 y.o. [name] Primary Care Provider: [name] Other Clinician: Referring Provider: Treating Provider/Extender: [name], [name]: 100 Diagnosis Coding ICD-10 Codes Code Description [phone] Non-pressure chronic ulcer of skin of other sites with fat layer exposed L58.9 Radiodermatitis, unspecified L59.8 Other specified disorders of the skin and subcutaneous tissue related to radiation I10 Essential (primary) hypertension Facility Procedures : The patient participates with Medicare or their insurance follows the Medicare Facility Guidelines: CPT4 Code Description Modifier Quantity [phone] Epicord 2cm x 3cm - per sqcm 6 ICD-10 Diagnosis Description L98.492 Non-pressure chronic ulcer of  skin of other sites with fat layer exposed : The patient participates with Medicare or their insurance follows the Medicare Facility Guidelines: [id number] - SKIN SUB GRAFT TRNK/ARM/LEG 1 ICD-10 Diagnosis Description L98.492 Non-pressure chronic ulcer of skin of other sites with  fat layer  exposed Physician Procedures : CPT4 Code Description Modifier [id number] - WC PHYS SKIN SUB GRAFT TRNK/ARM/LEG ICD-10 Diagnosis Description L98.492 Non-pressure  chronic ulcer of skin of other sites with fat layer exposed Quantity: 1 Electronic Signature(s) Signed: 07/23/2022 10:15:27 AM By: Worthy Keeler PA-C Entered By: Worthy Keeler on 07/23/2022 10:15:27

## 2022-07-23 NOTE — Progress Notes (Signed)
[name], [name] ([id number]) [id number] Page 1 of 6 Visit Report for 07/23/2022 Arrival Information Details Patient Name: Date of Service: [name] 07/23/2022 9:30 A M Medical Record Number: [id number] Patient Account Number: [id number] Date of Birth/Sex: Treating RN: 1951-10-04 (71 y.o. [name] Primary Care [name]: [name] Other Clinician: Referring Nicolae Vasek: Treating [name]/Extender: [name], [name]: 100 Visit Information History Since Last Visit Added or deleted any medications: No Patient Arrived: Ambulatory Any new allergies or adverse reactions: No Arrival Time: 09:28 Had a fall or experienced change in No Transfer Assistance: None activities of daily living that may affect Patient Identification Verified: Yes risk of falls: Secondary Verification Process Completed: Yes Signs or symptoms of abuse/neglect since last visito No Patient Requires Transmission-Based Precautions: No Hospitalized since last visit: No Patient Has Alerts: No Implantable device outside of the clinic excluding No cellular tissue based products placed in the center since last visit: Has Dressing in Place as Prescribed: Yes Pain Present Now: No Electronic Signature(s) Signed: 07/23/2022 5:41:25 PM By: Lorrin Jackson Entered By: Lorrin Jackson on 07/23/2022 09:28:21 -------------------------------------------------------------------------------- Encounter Discharge Information Details Patient Name: Date of Service: [name] 07/23/2022 9:30 A M Medical Record Number: [id number] Patient Account Number: [id number] Date of Birth/Sex: Treating RN: 1950-11-21 (71 y.o. [name] Primary Care Cleotis Sparr: [name] Other Clinician: Referring [name]: Treating [name]/Extender: [name], [name]: 100 Encounter Discharge Information Items Post Procedure  Vitals Discharge Condition: Stable Temperature (F): 98 Ambulatory Status: Ambulatory Pulse (bpm): 74 Discharge Destination: Home Respiratory Rate (breaths/min): 18 Transportation: Private Auto Blood Pressure (mmHg): 149/79 Schedule Follow-up Appointment: Yes Clinical Summary of Care: Provided on 07/23/2022 Form Type Recipient Paper Patient Patient Electronic Signature(s) Signed: 07/23/2022 5:41:25 PM By: Lorrin Jackson Entered By: Lorrin Jackson on 07/23/2022 10:19:41 [name] ([id number]) [id number] Page 2 of 6 -------------------------------------------------------------------------------- Lower Extremity Assessment Details Patient Name: Date of Service: [name] 07/23/2022 9:30 A M Medical Record Number: [id number] Patient Account Number: [id number] Date of Birth/Sex: Treating RN: 11-Sep-1951 (71 y.o. [name] Primary Care Malikai Gut: [name] Other Clinician: Referring [name]: Treating Lorelie Biermann/Extender: [name], [name]: 100 Electronic Signature(s) Signed: 07/23/2022 5:41:25 PM By: Lorrin Jackson Entered By: Lorrin Jackson on 07/23/2022 09:31:07 -------------------------------------------------------------------------------- Multi-Disciplinary Care Plan Details Patient Name: Date of Service: [name] 07/23/2022 9:30 A M Medical Record Number: [id number] Patient Account Number: [id number] Date of Birth/Sex: Treating RN: [birth year] (71 y.o. [name] Primary Care Shemaiah Round: [name] Other Clinician: Referring [name]: Treating [name]/Extender: [name], [name]: [employer] reviewed with physician Active Inactive Wound/Skin Impairment Nursing Diagnoses: Impaired tissue integrity Knowledge deficit related to ulceration/compromised skin integrity Goals: Patient/caregiver will verbalize understanding of  skin care regimen Date Initiated: 08/22/2020 Target Resolution Date: 08/06/2022 Goal Status: Active Ulcer/skin breakdown will have a volume reduction of 30% by week 4 Date Initiated: 08/22/2020 Date Inactivated: 10/03/2020 Target Resolution Date: 09/19/2020 Goal Status: Met Ulcer/skin breakdown will have a volume reduction of 50% by week 8 Date Initiated: 06/05/2021 Date Inactivated: 07/17/2021 Target Resolution Date: 07/03/2021 Goal Status: Unmet Unmet Reason: infection Interventions: Assess patient/caregiver ability to obtain necessary supplies Assess patient/caregiver ability to perform ulcer/skin care regimen upon admission and as needed Assess ulceration(s) every visit Treatment Activities: Skin care regimen initiated : 08/22/2020 Topical wound management initiated : 08/22/2020 Notes: 06/05/21: Wound care regimen ongoing. 06/11/22: Wound care regimen continues, applying skin  sub ([place]picord) Electronic Signature(s) Signed: 07/23/2022 5:41:25 PM By: Lorrin Jackson Entered By: Lorrin Jackson on 07/23/2022 09:37:34 [name] ([id number]) [id number] Page 3 of 6 -------------------------------------------------------------------------------- Pain Assessment Details Patient Name: Date of Service: [name] 07/23/2022 9:30 A M Medical Record Number: [id number] Patient Account Number: [id number] Date of Birth/Sex: Treating RN: 11-12-1950 (71 y.o. [name] Primary Care Phiona Ramnauth: [name] Other Clinician: Referring Tine Mabee: Treating [name]/Extender: [name], [name]: 100 Active Problems Location of Pain Severity and Description of Pain Patient Has Paino No Site Locations Pain Management and Medication Current Pain Management: Electronic Signature(s) Signed: 07/23/2022 5:41:25 PM By: Lorrin Jackson Entered By: Lorrin Jackson on 07/23/2022  09:30:58 -------------------------------------------------------------------------------- Patient/Caregiver Education Details Patient Name: Date of Service: [name], [name] 10/11/2023andnbsp9:30 A M Medical Record Number: [id number] Patient Account Number: [id number] Date of Birth/Gender: Treating RN: 04-09-1951 (72 y.o. [name] Primary Care Physician: [name] Other Clinician: Referring Physician: Treating Physician/Extender: [name], [name] in Treatment: 100 Education Assessment Education Provided To: Patient Education Topics Provided Wound/Skin Impairment: Methods: Explain/Verbal, Printed Responses: State content correctly [place], Letta Median ([id number]) [id number] Page 4 of 6 Electronic Signature(s) Signed: 07/23/2022 5:41:25 PM By: Lorrin Jackson Entered By: Lorrin Jackson on 07/23/2022 09:37:48 -------------------------------------------------------------------------------- Wound Assessment Details Patient Name: Date of Service: [name] 07/23/2022 9:30 A M Medical Record Number: [id number] Patient Account Number: [id number] Date of Birth/Sex: Treating RN: Jan 10, 1951 (71 y.o. [name] Primary Care Kikue Gerhart: [name] Other Clinician: Referring Keyoni Lapinski: Treating Kassadie Pancake/Extender: [name], [name]: 100 Wound Status Wound Number: 1 Primary Soft Tissue Radionecrosis Etiology: Wound Location: Right Breast (mastectomy site) Wound Open Wounding Event: Radiation Burn Status: Date Acquired: 07/26/2020 Comorbid Anemia, Lymphedema, Deep Vein Thrombosis, Hypertension, Weeks Of Treatment: 100 History: Peripheral Venous Disease, Osteoarthritis, Received Clustered Wound: No Chemotherapy, Received Radiation, Confinement Anxiety Photos Wound Measurements Length: (cm) 1.7 Width: (cm) 2.5 Depth: (cm) 0.3 Area: (cm) 3.338 Volume: (cm) 1.001 % Reduction in  Area: -117.9% % Reduction in Volume: -554.2% Epithelialization: None Tunneling: No Undermining: No Wound Description Classification: Full Thickness Without Exposed Suppor Wound Margin: Distinct, outline attached Exudate Amount: Medium Exudate Type: Serosanguineous Exudate Color: red, brown t Structures Foul Odor After Cleansing: No Slough/Fibrino Yes Wound Bed Granulation Amount: Large (67-100%) Exposed Structure Granulation Quality: Red, Friable Fascia Exposed: No Necrotic Amount: Small (1-33%) Fat Layer (Subcutaneous Tissue) Exposed: Yes Necrotic Quality: Adherent Slough Tendon Exposed: No Muscle Exposed: No Joint Exposed: No Bone Exposed: No Periwound Skin Texture Texture Color No Abnormalities Noted: Yes No Abnormalities Noted: No [address]Christie Beckers PRE[place], WEILL ([id number]) [id number] Page 5 of 6 Cyanosis: No No Abnormalities Noted: Yes Ecchymosis: No Erythema: No Hemosiderin Staining: No Mottled: No Pallor: No Rubor: No Temperature / Pain Temperature: No Abnormality Treatment Notes Wound #1 (Breast (mastectomy site)) Wound Laterality: Right Cleanser Soap and Water Discharge Instruction: May shower and wash wound with dial antibacterial soap and water prior to dressing change. Peri-Wound Care Skin Prep Discharge Instruction: Use skin prep as directed Topical Primary Dressing Cutimed Sorbact Swab Discharge Instruction: Apply to wound bed as instructed Secondary Dressing ALLEVYN Gentle Border, 5x5 (in/in) Discharge Instruction: Apply over primary dressing as directed. Woven Gauze Sponges 2x2 in Discharge Instruction: Apply over primary dressing, fold in corners to fill the space Secured With Compression Wrap Compression Stockings Add-Ons Electronic Signature(s) Signed: 07/23/2022 5:41:25 PM By: Lorrin Jackson Entered By: Lorrin Jackson on 07/23/2022  09:40:11 -------------------------------------------------------------------------------- Vitals Details  Patient Name: Date of Service: [name] INS, [place] [name] 07/23/2022 9:30 A M Medical Record Number: [id number] Patient Account Number: [id number] Date of Birth/Sex: Treating RN: May 31, 1951 (71 y.o. [name] Primary Care Ansar Skoda: [name] Other Clinician: Referring Kionna Brier: Treating Ashliegh Parekh/Extender: [name], [name]: 100 Vital Signs Time Taken: 09:28 Temperature (F): 98 Height (in): 63 Pulse (bpm): 74 Weight (lbs): 176 Respiratory Rate (breaths/min): 18 Body Mass Index (BMI): 31.2 Blood Pressure (mmHg): 149/79 Reference Range: 80 - 120 mg / dl Electronic Signature(s) Signed: 07/23/2022 5:41:25 PM By: Lorrin Jackson Entered By: Lorrin Jackson on 07/23/2022 09:30:30 [name] ([id number]) [id number] Page 6 of 6

## 2022-07-24 ENCOUNTER — Inpatient Hospital Stay: Payer: Medicare Other | Attending: Hematology & Oncology

## 2022-07-24 ENCOUNTER — Inpatient Hospital Stay (HOSPITAL_BASED_OUTPATIENT_CLINIC_OR_DEPARTMENT_OTHER): Payer: Medicare Other | Admitting: Medical Oncology

## 2022-07-24 ENCOUNTER — Encounter: Payer: Self-pay | Admitting: Medical Oncology

## 2022-07-24 ENCOUNTER — Inpatient Hospital Stay: Payer: Medicare Other

## 2022-07-24 VITALS — BP 148/53 | HR 71 | Temp 98.0°F | Resp 17 | Wt 193.1 lb

## 2022-07-24 DIAGNOSIS — M818 Other osteoporosis without current pathological fracture: Secondary | ICD-10-CM | POA: Diagnosis not present

## 2022-07-24 DIAGNOSIS — C50911 Malignant neoplasm of unspecified site of right female breast: Secondary | ICD-10-CM | POA: Insufficient documentation

## 2022-07-24 DIAGNOSIS — Z86718 Personal history of other venous thrombosis and embolism: Secondary | ICD-10-CM | POA: Diagnosis not present

## 2022-07-24 DIAGNOSIS — C50011 Malignant neoplasm of nipple and areola, right female breast: Secondary | ICD-10-CM

## 2022-07-24 DIAGNOSIS — Z13820 Encounter for screening for osteoporosis: Secondary | ICD-10-CM | POA: Diagnosis not present

## 2022-07-24 DIAGNOSIS — Z79899 Other long term (current) drug therapy: Secondary | ICD-10-CM | POA: Insufficient documentation

## 2022-07-24 DIAGNOSIS — Z79811 Long term (current) use of aromatase inhibitors: Secondary | ICD-10-CM | POA: Diagnosis not present

## 2022-07-24 DIAGNOSIS — N189 Chronic kidney disease, unspecified: Secondary | ICD-10-CM | POA: Insufficient documentation

## 2022-07-24 DIAGNOSIS — Z170 Estrogen receptor positive status [ER+]: Secondary | ICD-10-CM | POA: Insufficient documentation

## 2022-07-24 DIAGNOSIS — I890 Lymphedema, not elsewhere classified: Secondary | ICD-10-CM | POA: Diagnosis not present

## 2022-07-24 DIAGNOSIS — D631 Anemia in chronic kidney disease: Secondary | ICD-10-CM | POA: Insufficient documentation

## 2022-07-24 DIAGNOSIS — Z7982 Long term (current) use of aspirin: Secondary | ICD-10-CM | POA: Insufficient documentation

## 2022-07-24 DIAGNOSIS — Z1231 Encounter for screening mammogram for malignant neoplasm of breast: Secondary | ICD-10-CM

## 2022-07-24 DIAGNOSIS — D508 Other iron deficiency anemias: Secondary | ICD-10-CM

## 2022-07-24 DIAGNOSIS — E611 Iron deficiency: Secondary | ICD-10-CM | POA: Insufficient documentation

## 2022-07-24 DIAGNOSIS — D509 Iron deficiency anemia, unspecified: Secondary | ICD-10-CM | POA: Insufficient documentation

## 2022-07-24 LAB — CMP (CANCER CENTER ONLY)
ALT: 12 U/L (ref 0–44)
AST: 13 U/L — ABNORMAL LOW (ref 15–41)
Albumin: 4.1 g/dL (ref 3.5–5.0)
Alkaline Phosphatase: 62 U/L (ref 38–126)
Anion gap: 7 (ref 5–15)
BUN: 21 mg/dL (ref 8–23)
CO2: 30 mmol/L (ref 22–32)
Calcium: 9.3 mg/dL (ref 8.9–10.3)
Chloride: 107 mmol/L (ref 98–111)
Creatinine: 0.67 mg/dL (ref 0.44–1.00)
GFR, Estimated: >60 mL/min (ref >60)
Glucose, Bld: 105 mg/dL — ABNORMAL HIGH (ref 70–99)
Potassium: 4 mmol/L (ref 3.5–5.1)
Sodium: 144 mmol/L (ref 135–145)
Total Bilirubin: 0.4 mg/dL (ref 0.3–1.2)
Total Protein: 7.3 g/dL (ref 6.5–8.1)

## 2022-07-24 LAB — CBC WITH DIFFERENTIAL (CANCER CENTER ONLY)
Abs Immature Granulocytes: 0.08 10*3/uL — ABNORMAL HIGH (ref 0.00–0.07)
Basophils Absolute: 0 10*3/uL (ref 0.0–0.1)
Basophils Relative: 0 %
Eosinophils Absolute: 0.2 10*3/uL (ref 0.0–0.5)
Eosinophils Relative: 3 %
HCT: 34.4 % — ABNORMAL LOW (ref 36.0–46.0)
Hemoglobin: 10.7 g/dL — ABNORMAL LOW (ref 12.0–15.0)
Immature Granulocytes: 1 %
Lymphocytes Relative: 32 %
Lymphs Abs: 2 10*3/uL (ref 0.7–4.0)
MCH: 29 pg (ref 26.0–34.0)
MCHC: 31.1 g/dL (ref 30.0–36.0)
MCV: 93.2 fL (ref 80.0–100.0)
Monocytes Absolute: 0.5 10*3/uL (ref 0.1–1.0)
Monocytes Relative: 7 %
Neutro Abs: 3.5 10*3/uL (ref 1.7–7.7)
Neutrophils Relative %: 57 %
Platelet Count: 185 10*3/uL (ref 150–400)
RBC: 3.69 MIL/uL — ABNORMAL LOW (ref 3.87–5.11)
RDW: 13.4 % (ref 11.5–15.5)
WBC Count: 6.2 10*3/uL (ref 4.0–10.5)
nRBC: 0 % (ref 0.0–0.2)

## 2022-07-24 LAB — RETICULOCYTES
Immature Retic Fract: 10.4 % (ref 2.3–15.9)
RBC.: 3.63 MIL/uL — ABNORMAL LOW (ref 3.87–5.11)
Retic Count, Absolute: 64.6 10*3/uL (ref 19.0–186.0)
Retic Ct Pct: 1.8 % (ref 0.4–3.1)

## 2022-07-24 LAB — IRON AND IRON BINDING CAPACITY (CC-WL,HP ONLY)
Iron: 61 ug/dL (ref 28–170)
Saturation Ratios: 24 % (ref 10.4–31.8)
TIBC: 251 ug/dL (ref 250–450)
UIBC: 190 ug/dL (ref 148–442)

## 2022-07-24 LAB — FERRITIN: Ferritin: 391 ng/mL — ABNORMAL HIGH (ref 11–307)

## 2022-07-24 MED ORDER — DARBEPOETIN ALFA 300 MCG/0.6ML IJ SOSY
300.0000 ug | PREFILLED_SYRINGE | Freq: Once | INTRAMUSCULAR | Status: AC
  Administered 2022-07-24: 300 ug via SUBCUTANEOUS
  Filled 2022-07-24: qty 0.6

## 2022-07-24 NOTE — Progress Notes (Signed)
Hematology and Oncology Follow Up Visit  [name] [id number] 01/28/1951 71 y.o. 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 Aromasin 25 mg p.o. daily - on hold -- start on 03/18/2021 -- hold on 01/02/1022 2. Aspirin 81 mg p.o. daily 3.  Feraheme-510 mg IV as needed.  Last dose given on 08/23/2021 4.  Aranesp 300 mcg sq for Hgb less than 11 -- start on 02/17/2022   Interim History:  [name] is here today for follow-up.  As expected, she has responded very well to the Aranesp. Last injection was on 02/17/2022.   She continues to have the wound that is over on the right anterior chest wall.  She is seen by the wound clinic weekly and is doing well. They estimate that she will only need about 3 more visits.   Continues to have arthralgias for which she is seen by rheumatology.   When we last saw her, her ferritin was 432 with an iron saturation of 26%.  There is no problems with bowels or bladder.  She has had no bleeding.  There is been no leg swelling.  Stable lymphedema of the right arm   She is on baby aspirin for the superficial thrombus of the right thigh. No known bleeding episodes or new areas of concern  Currently, I would say her performance status is probably ECOG 1.       Medications:  Allergies as of 07/24/2022       Reactions   Contrast Media [iodinated Contrast Media] Hives, Shortness Of Breath, Nausea And Vomiting   Allergic reaction even after pre-meds.   Metrizamide Hives, Shortness Of Breath, Nausea And Vomiting   Allergic reaction even after pre-meds.   Other Shortness Of Breath, Nausea And Vomiting, Rash   Allergic reaction even after pre-meds.   Dye Fdc Blue [brilliant Blue Fcf (fd&c Blue #1)] Hives, Nausea Only   Fd&c Blue #1 (brilliant Blue Fcf) Hives, Nausea Only         Medication List        Accurate as of July 24, 2022 10:10 AM. If you have any questions, ask your nurse or doctor.          amLODipine 10 MG tablet Commonly known as: NORVASC TAKE 1 TABLET BY MOUTH DAILY   amoxicillin 500 MG capsule Commonly known as: AMOXIL Before dental procedures   aspirin EC 81 MG tablet Take 81 mg by mouth daily.   candesartan 32 MG tablet Commonly known as: ATACAND Take 32 mg by mouth daily.   celecoxib 200 MG capsule Commonly known as: CELEBREX Take 200 mg by mouth daily as needed.   exemestane 25 MG tablet Commonly known as: AROMASIN TAKE 1 TABLET BY MOUTH EVERY DAY   Gentamicin Sulfate Powd Apply 1 Application topically daily.   hydrochlorothiazide 25 MG tablet Commonly known as: HYDRODIURIL Take 25 mg by mouth daily.   IBUPROFEN PO Take by mouth.   metoprolol tartrate 100 MG tablet Commonly known as: LOPRESSOR Take 100 mg by mouth daily.   simvastatin 20 MG tablet Commonly known as: ZOCOR Take 20 mg by mouth daily.   Vitamin D (Ergocalciferol) 1.25 MG (50000 UNIT) Caps capsule Commonly known as: DRISDOL TAKE 1 CAPSULE BY MOUTH ONE TIME PER WEEK        Allergies:  Allergies  Allergen Reactions   Contrast Media [Iodinated Contrast Media] Hives, Shortness Of Breath and Nausea And Vomiting    Allergic reaction even after pre-meds.   Metrizamide Hives, Shortness Of Breath and Nausea And Vomiting    Allergic reaction even after pre-meds.   Other Shortness Of Breath, Nausea And Vomiting and Rash    Allergic reaction even after pre-meds.   Dye Fdc Blue [Brilliant Blue Fcf (Fd&C Blue #1)] Hives and Nausea Only   Fd&C Blue #1 (Brilliant Blue Fcf) Hives and Nausea Only    Past Medical History, Surgical history, Social history, and Family History were reviewed and updated.  Review of Systems: Review of Systems  Constitutional: Negative.   HENT:  Positive for hearing loss.   Eyes:  Negative for discharge.   Respiratory: Negative.    Cardiovascular: Negative.   Gastrointestinal: Negative.   Genitourinary: Negative.   Musculoskeletal: Negative.   Skin: Negative.   Neurological: Negative.   Endo/Heme/Allergies: Negative.   Psychiatric/Behavioral: Negative.       Physical Exam:  weight is 193 lb 1.9 oz (87.6 kg). Her oral temperature is 98 F (36.7 C). Her blood pressure is 148/53 (abnormal) and her pulse is 71. Her respiration is 17 and oxygen saturation is 100%.   Wt Readings from Last 3 Encounters:  07/24/22 193 lb 1.9 oz (87.6 kg)  07/10/22 193 lb (87.5 kg)  05/29/22 190 lb 1.3 oz (86.2 kg)   Physical Exam Vitals reviewed.  Constitutional:      Comments: Right chest wall has clean bandage   HENT:     Head: Normocephalic and atraumatic.  Eyes:     Pupils: Pupils are equal, round, and reactive to light.  Cardiovascular:     Rate and Rhythm: Normal rate and regular rhythm.     Heart sounds: Normal heart sounds.  Pulmonary:     Effort: Pulmonary effort is normal.     Breath sounds: Normal breath sounds.  Abdominal:     General: Bowel sounds are normal.     Palpations: Abdomen is soft.  Musculoskeletal:        General: No tenderness or deformity. Normal range of motion.     Cervical back: Normal range of motion.  Lymphadenopathy:     Cervical: No cervical adenopathy.  Skin:    General: Skin is warm and dry.     Findings: No erythema or rash.  Neurological:     Mental Status: She is alert and oriented to person, place, and time.  Psychiatric:        Behavior: Behavior normal.        Thought Content: Thought content normal.        Judgment: Judgment normal.     Lab Results  Component Value Date   WBC 6.2 07/24/2022   HGB 10.7 (L) 07/24/2022   HCT 34.4 (L) 07/24/2022   MCV 93.2 07/24/2022   PLT 185 07/24/2022   Lab Results  Component Value Date   FERRITIN 432 (H) 05/29/2022   IRON 60 05/29/2022   TIBC 228 (L) 05/29/2022   UIBC 168 05/29/2022   IRONPCTSAT 26  05/29/2022   Lab Results  Component Value Date   RETICCTPCT 1.8 07/24/2022   RBC 3.69 (L) 07/24/2022   RBC 3.63 (L) 07/24/2022   RETICCTABS 55.2 06/20/2011   No results found for: "KPAFRELGTCHN", "LAMBDASER", "KAPLAMBRATIO" No results found for: "IGGSERUM", "IGA", "IGMSERUM" Lab Results  Component Value Date   TOTALPROTELP 6.6 08/14/2010   ALBUMINELP 53.8 (L) 08/14/2010  A1GS 5.3 (H) 08/14/2010   A2GS 14.3 (H) 08/14/2010   BETS 5.8 08/14/2010   BETA2SER 6.3 08/14/2010   GAMS 14.5 08/14/2010   MSPIKE NOT DET 08/14/2010   SPEI * 08/14/2010     Chemistry      Component Value Date/Time   NA 144 07/24/2022 0929   NA 146 (H) 07/23/2017 0900   NA 144 02/18/2017 0901   K 4.0 07/24/2022 0929   K 3.7 07/23/2017 0900   K 3.7 02/18/2017 0901   CL 107 07/24/2022 0929   CL 107 07/23/2017 0900   CO2 30 07/24/2022 0929   CO2 31 07/23/2017 0900   CO2 27 02/18/2017 0901   BUN 21 07/24/2022 0929   BUN 16 07/23/2017 0900   BUN 17.7 02/18/2017 0901   CREATININE 0.67 07/24/2022 0929   CREATININE 0.8 07/23/2017 0900   CREATININE 0.7 02/18/2017 0901      Component Value Date/Time   CALCIUM 9.3 07/24/2022 0929   CALCIUM 9.3 07/23/2017 0900   CALCIUM 9.3 02/18/2017 0901   ALKPHOS 62 07/24/2022 0929   ALKPHOS 67 07/23/2017 0900   ALKPHOS 78 02/18/2017 0901   AST 13 (L) 07/24/2022 0929   AST 13 02/18/2017 0901   ALT 12 07/24/2022 0929   ALT 20 07/23/2017 0900   ALT 12 02/18/2017 0901   BILITOT 0.4 07/24/2022 0929   BILITOT 0.54 02/18/2017 0901       Impression and Plan: [name] is a very pleasant 71 yo caucasian female with history of locally recurrent adenocarcinoma of the right breast with resection and radiation.   Continue to hold Aromasin as she thinks it made her chronic joint pains worse. She will continue working with rheumatology. She has not taken Aromasin now for probably 5-6 months.  Iron storage labs pending at time of visit.   Hgb 10.7 today- Aranesp today  which she tolerates well.   Mammogram and DEXA before next visit  Disposition: Aranesp today Mammogram/DEXA before next visit RTC 2 months MD, Labs, +- aranesp    10/12/202310:10 AM

## 2022-07-24 NOTE — Patient Instructions (Signed)
Darbepoetin Alfa injection ?What is this medication? ?DARBEPOETIN ALFA (dar be POE e tin  AL fa) helps your body make more red blood cells. It is used to treat anemia caused by chronic kidney failure and chemotherapy. ?This medicine may be used for other purposes; ask your health care provider or pharmacist if you have questions. ?COMMON BRAND NAME(S): Aranesp ?What should I tell my care team before I take this medication? ?They need to know if you have any of these conditions: ?blood clotting disorders or history of blood clots ?cancer patient not on chemotherapy ?cystic fibrosis ?heart disease, such as angina, heart failure, or a history of a heart attack ?hemoglobin level of 12 g/dL or greater ?high blood pressure ?low levels of folate, iron, or vitamin B12 ?seizures ?an unusual or allergic reaction to darbepoetin, erythropoietin, albumin, hamster proteins, latex, other medicines, foods, dyes, or preservatives ?pregnant or trying to get pregnant ?breast-feeding ?How should I use this medication? ?This medicine is for injection into a vein or under the skin. It is usually given by a health care professional in a hospital or clinic setting. ?If you get this medicine at home, you will be taught how to prepare and give this medicine. Use exactly as directed. Take your medicine at regular intervals. Do not take your medicine more often than directed. ?It is important that you put your used needles and syringes in a special sharps container. Do not put them in a trash can. If you do not have a sharps container, call your pharmacist or healthcare provider to get one. ?A special MedGuide will be given to you by the pharmacist with each prescription and refill. Be sure to read this information carefully each time. ?Talk to your pediatrician regarding the use of this medicine in children. While this medicine may be used in children as young as 1 month of age for selected conditions, precautions do apply. ?Overdosage: If  you think you have taken too much of this medicine contact a poison control center or emergency room at once. ?NOTE: This medicine is only for you. Do not share this medicine with others. ?What if I miss a dose? ?If you miss a dose, take it as soon as you can. If it is almost time for your next dose, take only that dose. Do not take double or extra doses. ?What may interact with this medication? ?Do not take this medicine with any of the following medications: ?epoetin alfa ?This list may not describe all possible interactions. Give your health care provider a list of all the medicines, herbs, non-prescription drugs, or dietary supplements you use. Also tell them if you smoke, drink alcohol, or use illegal drugs. Some items may interact with your medicine. ?What should I watch for while using this medication? ?Your condition will be monitored carefully while you are receiving this medicine. ?You may need blood work done while you are taking this medicine. ?This medicine may cause a decrease in vitamin B6. You should make sure that you get enough vitamin B6 while you are taking this medicine. Discuss the foods you eat and the vitamins you take with your health care professional. ?What side effects may I notice from receiving this medication? ?Side effects that you should report to your doctor or health care professional as soon as possible: ?allergic reactions like skin rash, itching or hives, swelling of the face, lips, or tongue ?breathing problems ?changes in vision ?chest pain ?confusion, trouble speaking or understanding ?feeling faint or lightheaded, falls ?high blood   pressure ?muscle aches or pains ?pain, swelling, warmth in the leg ?rapid weight gain ?severe headaches ?sudden numbness or weakness of the face, arm or leg ?trouble walking, dizziness, loss of balance or coordination ?seizures (convulsions) ?swelling of the ankles, feet, hands ?unusually weak or tired ?Side effects that usually do not require  medical attention (report to your doctor or health care professional if they continue or are bothersome): ?diarrhea ?fever, chills (flu-like symptoms) ?headaches ?nausea, vomiting ?redness, stinging, or swelling at site where injected ?This list may not describe all possible side effects. Call your doctor for medical advice about side effects. You may report side effects to FDA at 1-800-FDA-1088. ?Where should I keep my medication? ?Keep out of the reach of children and pets. ?Store in a refrigerator. Do not freeze. Do not shake. Throw away any unused portion if using a single-dose vial. Multi-dose vials can be kept in the refrigerator for up to 21 days after the initial dose. Throw away unused medicine. ?To get rid of medications that are no longer needed or have expired: ?Take the medication to a medication take-back program. Check with your pharmacy or law enforcement to find a location. ?If you cannot return the medication, ask your pharmacist or care team how to get rid of the medication safely. ?NOTE: This sheet is a summary. It may not cover all possible information. If you have questions about this medicine, talk to your doctor, pharmacist, or health care provider. ?? 2023 [employer]vier/Gold Standard (2021-09-30 00:00:00) ? ?

## 2022-07-25 LAB — CANCER ANTIGEN 27.29: CA 27.29: 23.1 U/mL (ref 0.0–38.6)

## 2022-07-25 NOTE — Progress Notes (Unsigned)
Office Visit Note  Patient: [name]             Date of Birth: [birth year]           MRN: [id number]             PCP: [name], MD Referring: [name], MD Visit Date: 07/31/2022 Occupation: _0 @  Subjective:  Pain in multiple joints  History of Present Illness: [name] is a 71 y.o. female with history of seropositive rheumatoid arthritis, osteoarthritis and lower back pain.  She states she continues to have pain and discomfort in her left shoulder joint.  She has difficulty raising her left arm.  She has stiffness and swelling in her hands.  She has ongoing pain and discomfort in her right knee joint.  She also has some discomfort in her feet.  She states she has a nonhealing wound on her right chest wall for which she has been going to the wound clinic.  Activities of Daily Living:  Patient reports morning stiffness for a few minutes.   Patient Reports nocturnal pain.  Difficulty dressing/grooming: Reports Difficulty climbing stairs: Reports Difficulty getting out of chair: Reports Difficulty using hands for taps, buttons, cutlery, and/or writing: Reports  Review of Systems  Constitutional:  Positive for fatigue.  HENT:  Positive for mouth dryness. Negative for mouth sores.   Eyes:  Negative for dryness.  Respiratory:  Negative for difficulty breathing.   Cardiovascular:  Negative for chest pain and palpitations.  Gastrointestinal:  Negative for blood in stool, constipation and diarrhea.  Endocrine: Negative for increased urination.  Genitourinary:  Positive for nocturia. Negative for involuntary urination.  Musculoskeletal:  Positive for joint pain, joint pain, joint swelling, myalgias, muscle weakness, morning stiffness, muscle tenderness and myalgias. Negative for gait problem.  Skin:  Negative for color change, rash, hair loss and sensitivity to sunlight.  Allergic/Immunologic: Negative for susceptible to infections.  Neurological:  Positive for  dizziness and headaches.  Hematological:  Negative for swollen glands.  Psychiatric/Behavioral:  Negative for depressed mood and sleep disturbance. The patient is not nervous/anxious.     PMFS History:  Patient Active Problem List   Diagnosis Date Noted   Rheumatoid arthritis involving multiple sites with positive rheumatoid factor ([place]) 07/31/2022   Primary osteoarthritis of right knee 07/31/2022   Essential hypertension 07/31/2022   History of hyperlipidemia 07/31/2022   History of DVT (deep vein thrombosis) 07/31/2022   History of gastroesophageal reflux (GERD) 07/31/2022   History of anxiety 07/31/2022   Erythropoietin deficiency anemia 02/13/2022   IDA (iron deficiency anemia) 02/01/2018   Lymphedema of right arm 08/24/2015   Breast cancer, right breast ([place]) 06/23/2011    Past Medical History:  Diagnosis Date   Anemia    Arthritis    Blood transfusion    Cancer ([place]) 2012   right breast   DVT (deep venous thrombosis) (HCC)    Erythropoietin deficiency anemia 02/13/2022   Hyperlipidemia    Hypertension    Leg swelling     Family History  Problem Relation Age of Onset   Cervical cancer Mother    Diabetes Mother    Hypertension Mother    Hypertension Father    Cancer Father 7       lung cancer    Heart attack Father    Lung cancer Father    Parkinson's disease Sister    Hypertension Brother    Diabetes Brother    Heart disease Brother    Parkinson's disease  Brother    Hypertension Brother    Diabetes Brother    Prostate cancer Brother    Hypertension Brother    Heart disease Brother    Diabetes Brother    Hypertension Brother    Breast cancer Neg Hx    Past Surgical History:  Procedure Laterality Date   BREAST SURGERY  10/13/1996   chest wall exploratory  07/01/2011   CHOLECYSTECTOMY     HIP ARTHROPLASTY     JOINT REPLACEMENT  04/04/2011   hip replacement    MASTECTOMY Right 2012   TUBAL LIGATION     Social History   Social History Narrative    Not on file   Immunization History  Administered Date(s) Administered   Influenza,inj,Quad PF,6+ Mos 08/07/2014, 07/12/2019   Influenza-Unspecified 07/24/2015     Objective: Vital Signs: BP (!) 144/79 (BP Location: Left Arm, Patient Position: Sitting, Cuff Size: Large)   Pulse 71   Resp 14   Ht 5' 3.5" (1.613 m)   Wt 193 lb 9.6 oz (87.8 kg)   BMI 33.76 kg/m    Physical Exam Vitals and nursing note reviewed.  Constitutional:      Appearance: She is well-developed.  HENT:     Head: Normocephalic and atraumatic.  Eyes:     Conjunctiva/sclera: Conjunctivae normal.  Cardiovascular:     Rate and Rhythm: Normal rate and regular rhythm.     Heart sounds: Normal heart sounds.  Pulmonary:     Effort: Pulmonary effort is normal.     Breath sounds: Normal breath sounds.  Abdominal:     General: Bowel sounds are normal.     Palpations: Abdomen is soft.  Musculoskeletal:     Cervical back: Normal range of motion.  Lymphadenopathy:     Cervical: No cervical adenopathy.  Skin:    General: Skin is warm and dry.     Capillary Refill: Capillary refill takes less than 2 seconds.  Neurological:     Mental Status: She is alert and oriented to person, place, and time.  Psychiatric:        Behavior: Behavior normal.      Musculoskeletal Exam: She had limited lateral rotation of the cervical spine.  She had discomfort with range of motion of her lumbar spine.  She had left shoulder joint abduction limited to 120 degrees with discomfort.  Right shoulder joint was in full range of motion.  Elbow joints and wrist joints with good range of motion with no synovitis.  She had synovitis over right third and left second MCP joint today.  She had  bilateral CMC PIP and DIP thickening.  Hip joints were replaced and were in limited range of motion.  Right knee joint had warmth on palpation and had limited extension.  There was no tenderness over ankles or MTPs.  CDAI Exam: CDAI Score: 8  Patient  Global: 5 mm; Provider Global: 5 mm Swollen: 2 ; Tender: 8  Joint Exam 07/31/2022      Right  Left  Glenohumeral      Tender  CMC   Tender   Tender  MCP 2     Swollen Tender  MCP 3  Swollen Tender     PIP 2      Tender  Knee   Tender     MTP 5      Tender     Investigation: No additional findings.  Imaging: XR KNEE 3 VIEW RIGHT  Result Date: 07/10/2022 Moderate to severe medial compartment  narrowing with medial, intercondylar and lateral osteophytes were noted.  Severe patellofemoral narrowing was noted.  No chondrocalcinosis was noted. Impression: These findings are consistent with moderate to severe osteoarthritis and severe chondromalacia patella.  XR Hand 2 View Left  Result Date: 07/10/2022 Generalized osteopenia was noted.  Severe CMC narrowing and subluxation was noted.  PIP and DIP narrowing was noted.  Subluxation of first and second DIP joints was noted.  No MCP narrowing was noted.  Mild intercarpal and  radiocarpal joint space narrowing was noted.  No erosive changes were noted. Impression: These findings are consistent with osteoarthritis and possible inflammatory arthritis of the hand.  XR Hand 2 View Right  Result Date: 07/10/2022 Generalized osteopenia was noted.  Severe CMC narrowing and subluxation was noted.  PIP and DIP narrowing was noted.  Subluxation of first and second DIP joints was noted.  No MCP narrowing was noted.  Mild intercarpal and  radiocarpal joint space narrowing was noted.  No erosive changes were noted. Impression: These findings are consistent with osteoarthritis and possible inflammatory arthritis of the hand.  XR Foot 2 Views Left  Result Date: 07/10/2022 Generalized osteopenia was noted.  First MTP, PIP and DIP narrowing was noted.  No intertarsal, tibiotalar or subtalar joint space narrowing was noted.  Inferior and posterior calcaneal spurs were noted.  Erosive changes were noted over the fifth metatarsal head. Impression: These findings  are consistent with severe osteoarthritis and erosive inflammatory arthritis most likely rheumatoid arthritis.  XR Foot 2 Views Right  Result Date: 07/10/2022 Generalized osteopenia was noted.  First MTP, PIP and DIP narrowing was noted.  No intertarsal, tibiotalar or subtalar joint space narrowing was noted.  Inferior and posterior calcaneal spurs were noted.  No erosive changes were noted. Impression: These findings are consistent with severe osteoarthritis of the foot.   Recent Labs: Lab Results  Component Value Date   WBC 6.2 07/24/2022   HGB 10.7 (L) 07/24/2022   PLT 185 07/24/2022   NA 144 07/24/2022   K 4.0 07/24/2022   CL 107 07/24/2022   CO2 30 07/24/2022   GLUCOSE 105 (H) 07/24/2022   BUN 21 07/24/2022   CREATININE 0.67 07/24/2022   BILITOT 0.4 07/24/2022   ALKPHOS 62 07/24/2022   AST 13 (L) 07/24/2022   ALT 12 07/24/2022   PROT 7.3 07/24/2022   ALBUMIN 4.1 07/24/2022   CALCIUM 9.3 07/24/2022   GFRAA >60 05/17/2020   July 10, 2022 ESR 33, RF 299, anti-CCP> 250, uric acid 3.9, hepatitis B-, hepatitis C negative, G6PD normal  Speciality Comments: No specialty comments available.  Procedures:  No procedures performed Allergies: Contrast media [iodinated contrast media], Metrizamide, Other, Dye fdc blue [brilliant blue fcf (fd&c blue #1)], and Fd&c blue #1 (brilliant blue fcf)   Assessment / Plan:     Visit Diagnoses: Rheumatoid arthritis involving multiple sites with positive rheumatoid factor (HCC) - Positive RF, positive anti-CCP, elevated sedimentation rate, severe erosive disease.  Lab results were reviewed with the patient.  Detailed counseling on rheumatoid arthritis was provided.  She also had an erosion on her left fifth MTP joint.  She had synovitis over her MCP joints as described above.  With a history of erosive rheumatoid arthritis ideally she should be on methotrexate.  She is hesitant to go on any medications at this time because she has a nonhealing  wound on her chest.  She is also concerned about the side effects of these medications.  I discussed possible use of  hydroxychloroquine in the past when she gets clearance from the wound clinic.  She was in agreement.  I gave her a handout on hydroxychloroquine to review.  High risk medication use-the plan is to start her on hydroxychloroquine 200 mg p.o. twice daily Monday to Friday once the chest wall wound has completely healed and she gets clearance from the wound clinic.  Chronic left shoulder pain - History of chronic left shoulder pain.  Patient was evaluated by orthopedics and was told that she had arthritis.  She had limited range of motion of her left shoulder joint with discomfort.  Pain in both hands -she continues to have pain and stiffness in her bilateral hands.  She had synovitis over right third and left second MCP joints.  X-rays were consistent with rheumatoid arthritis and osteoarthritis overlap.  Findings were discussed with the patient.  Status post bilateral total hip replacement -she had limb abduction of her hip joints.  Right total hip replacement 2019, left total hip replacement 2012  Primary osteoarthritis of right knee - History of chronic pain and discomfort in the knee joint.  History of recent fall. moderate to severe osteoarthritis and severe chondromalacia patella was noted.  X-ray findings were discussed with the patient.  She has difficulty walking due to lower back pain and knee joint pain.  Pain in both feet - History of discomfort in bilateral feet.  X-rays showed severe erosive rheumatoid arthritis and osteoarthritis overlap.  X-ray hands were reviewed with the patient.  Malignant neoplasm of nipple of right breast in female, unspecified estrogen receptor status ([place]) - 1998, recurrence 04/2001 and 2012, that is post right mastectomy, chemotherapy and radiation therapy.  She has a nonhealing wound on the right chest wall for which she has been going to the wound  clinic.  Advised patient to get clearance from the wound clinic prior to considering starting hydroxychloroquine.  Lymphedema of right arm-postmastectomy  Erythropoietin deficiency anemia - hemoglobin 10.7.  Essential hypertension-blood pressure was elevated today.  She was advised to monitor blood pressure closely and follow-up with her PCP.  Other medical problems are listed as follows:  History of hyperlipidemia  History of DVT (deep vein thrombosis)  History of gastroesophageal reflux (GERD)  History of anxiety  Family history of rheumatoid arthritis- mother  Orders: No orders of the defined types were placed in this encounter.  No orders of the defined types were placed in this encounter.    Follow-Up Instructions: Return in about 3 months (around 10/31/2022) for Rheumatoid arthritis.   Bo Merino, MD  Note - This record has been created using Editor, commissioning.  Chart creation errors have been sought, but may not always  have been located. Such creation errors do not reflect on  the standard of medical care.

## 2022-07-29 ENCOUNTER — Telehealth: Payer: Self-pay | Admitting: *Deleted

## 2022-07-29 NOTE — Telephone Encounter (Signed)
Per 07/24/22 los - called and gave upcoming appointments - confirmed

## 2022-07-30 ENCOUNTER — Encounter (HOSPITAL_BASED_OUTPATIENT_CLINIC_OR_DEPARTMENT_OTHER): Payer: Medicare Other | Admitting: Internal Medicine

## 2022-07-30 DIAGNOSIS — I10 Essential (primary) hypertension: Secondary | ICD-10-CM | POA: Diagnosis not present

## 2022-07-30 DIAGNOSIS — L589 Radiodermatitis, unspecified: Secondary | ICD-10-CM | POA: Diagnosis not present

## 2022-07-30 DIAGNOSIS — L98492 Non-pressure chronic ulcer of skin of other sites with fat layer exposed: Secondary | ICD-10-CM | POA: Diagnosis not present

## 2022-07-30 DIAGNOSIS — Z9011 Acquired absence of right breast and nipple: Secondary | ICD-10-CM | POA: Diagnosis not present

## 2022-07-30 DIAGNOSIS — L598 Other specified disorders of the skin and subcutaneous tissue related to radiation: Secondary | ICD-10-CM | POA: Diagnosis not present

## 2022-07-30 DIAGNOSIS — Z853 Personal history of malignant neoplasm of breast: Secondary | ICD-10-CM | POA: Diagnosis not present

## 2022-07-31 ENCOUNTER — Encounter: Payer: Self-pay | Admitting: Rheumatology

## 2022-07-31 ENCOUNTER — Ambulatory Visit: Payer: Medicare Other | Attending: Rheumatology | Admitting: Rheumatology

## 2022-07-31 VITALS — BP 144/79 | HR 71 | Resp 14 | Ht 63.5 in | Wt 193.6 lb

## 2022-07-31 DIAGNOSIS — Z8639 Personal history of other endocrine, nutritional and metabolic disease: Secondary | ICD-10-CM

## 2022-07-31 DIAGNOSIS — I890 Lymphedema, not elsewhere classified: Secondary | ICD-10-CM

## 2022-07-31 DIAGNOSIS — M0579 Rheumatoid arthritis with rheumatoid factor of multiple sites without organ or systems involvement: Secondary | ICD-10-CM

## 2022-07-31 DIAGNOSIS — M79642 Pain in left hand: Secondary | ICD-10-CM

## 2022-07-31 DIAGNOSIS — D631 Anemia in chronic kidney disease: Secondary | ICD-10-CM | POA: Diagnosis not present

## 2022-07-31 DIAGNOSIS — M79672 Pain in left foot: Secondary | ICD-10-CM | POA: Insufficient documentation

## 2022-07-31 DIAGNOSIS — Z8261 Family history of arthritis: Secondary | ICD-10-CM

## 2022-07-31 DIAGNOSIS — I10 Essential (primary) hypertension: Secondary | ICD-10-CM

## 2022-07-31 DIAGNOSIS — Z8659 Personal history of other mental and behavioral disorders: Secondary | ICD-10-CM

## 2022-07-31 DIAGNOSIS — Z79899 Other long term (current) drug therapy: Secondary | ICD-10-CM | POA: Diagnosis not present

## 2022-07-31 DIAGNOSIS — Z8719 Personal history of other diseases of the digestive system: Secondary | ICD-10-CM

## 2022-07-31 DIAGNOSIS — M1711 Unilateral primary osteoarthritis, right knee: Secondary | ICD-10-CM

## 2022-07-31 DIAGNOSIS — Z96643 Presence of artificial hip joint, bilateral: Secondary | ICD-10-CM | POA: Diagnosis not present

## 2022-07-31 DIAGNOSIS — M79641 Pain in right hand: Secondary | ICD-10-CM | POA: Insufficient documentation

## 2022-07-31 DIAGNOSIS — M25512 Pain in left shoulder: Secondary | ICD-10-CM

## 2022-07-31 DIAGNOSIS — C50011 Malignant neoplasm of nipple and areola, right female breast: Secondary | ICD-10-CM

## 2022-07-31 DIAGNOSIS — G8929 Other chronic pain: Secondary | ICD-10-CM | POA: Diagnosis not present

## 2022-07-31 DIAGNOSIS — M79671 Pain in right foot: Secondary | ICD-10-CM | POA: Diagnosis not present

## 2022-07-31 DIAGNOSIS — Z86718 Personal history of other venous thrombosis and embolism: Secondary | ICD-10-CM

## 2022-07-31 HISTORY — DX: Personal history of other venous thrombosis and embolism: Z86.718

## 2022-07-31 HISTORY — DX: Essential (primary) hypertension: I10

## 2022-07-31 HISTORY — DX: Personal history of other endocrine, nutritional and metabolic disease: Z86.39

## 2022-07-31 HISTORY — DX: Rheumatoid arthritis with rheumatoid factor of multiple sites without organ or systems involvement: M05.79

## 2022-07-31 HISTORY — DX: Unilateral primary osteoarthritis, right knee: M17.11

## 2022-07-31 HISTORY — DX: Personal history of other mental and behavioral disorders: Z86.59

## 2022-07-31 HISTORY — DX: Personal history of other diseases of the digestive system: Z87.19

## 2022-07-31 NOTE — Patient Instructions (Addendum)

## 2022-08-06 ENCOUNTER — Encounter (HOSPITAL_BASED_OUTPATIENT_CLINIC_OR_DEPARTMENT_OTHER): Payer: Medicare Other | Admitting: Physician Assistant

## 2022-08-06 DIAGNOSIS — L98492 Non-pressure chronic ulcer of skin of other sites with fat layer exposed: Secondary | ICD-10-CM | POA: Diagnosis not present

## 2022-08-06 DIAGNOSIS — Z9011 Acquired absence of right breast and nipple: Secondary | ICD-10-CM | POA: Diagnosis not present

## 2022-08-06 DIAGNOSIS — Z853 Personal history of malignant neoplasm of breast: Secondary | ICD-10-CM | POA: Diagnosis not present

## 2022-08-06 DIAGNOSIS — L598 Other specified disorders of the skin and subcutaneous tissue related to radiation: Secondary | ICD-10-CM | POA: Diagnosis not present

## 2022-08-06 DIAGNOSIS — L589 Radiodermatitis, unspecified: Secondary | ICD-10-CM | POA: Diagnosis not present

## 2022-08-06 DIAGNOSIS — I10 Essential (primary) hypertension: Secondary | ICD-10-CM | POA: Diagnosis not present

## 2022-08-06 NOTE — Progress Notes (Signed)
OCEANIA, NOORI ([id number]) [id number] Page 1 of 2 Visit Report for 08/06/2022 Chief Complaint Document Details Patient Name: Date of Service: [name] 08/06/2022 9:30 A M Medical Record Number: [id number] Patient Account Number: [id number] Date of Birth/Sex: Treating RN: 1951/03/24 (71 y.o. 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

## 2022-08-07 ENCOUNTER — Ambulatory Visit (HOSPITAL_BASED_OUTPATIENT_CLINIC_OR_DEPARTMENT_OTHER)
Admission: RE | Admit: 2022-08-07 | Discharge: 2022-08-07 | Disposition: A | Discharge status: Home / Self Care | Payer: Medicare Other | Source: Ambulatory Visit | Attending: Medical Oncology | Admitting: Medical Oncology

## 2022-08-07 DIAGNOSIS — Z13820 Encounter for screening for osteoporosis: Secondary | ICD-10-CM | POA: Insufficient documentation

## 2022-08-07 DIAGNOSIS — C50011 Malignant neoplasm of nipple and areola, right female breast: Secondary | ICD-10-CM | POA: Diagnosis not present

## 2022-08-07 DIAGNOSIS — M818 Other osteoporosis without current pathological fracture: Secondary | ICD-10-CM | POA: Insufficient documentation

## 2022-08-07 DIAGNOSIS — Z780 Asymptomatic menopausal state: Secondary | ICD-10-CM | POA: Diagnosis not present

## 2022-08-07 NOTE — Progress Notes (Signed)
CLANCY, LEINER ([id number]) [id number] Page 1 of 6 Visit Report for 08/06/2022 Arrival Information Details Patient Name: Date of Service: [name] YE 08/06/2022 9:30 A M Medical Record Number: [id number] Patient Account Number: [id number] Date of Birth/Sex: Treating RN: August 06, 1951 (71 y.o. [name] Primary Care [name]: [name] Other Clinician: Referring Cambelle Suchecki: Treating [name]/Extender: [name], [name]: 38 Visit Information History Since Last Visit Added or deleted any medications: No Patient Arrived: Ambulatory Any new allergies or adverse reactions: No Arrival Time: 09:10 Had a fall or experienced change in No Accompanied By: self activities of daily living that may affect Transfer Assistance: None risk of falls: Patient Identification Verified: Yes Signs or symptoms of abuse/neglect since last visito No Secondary Verification Process Completed: Yes Hospitalized since last visit: No Patient Requires Transmission-Based Precautions: No Implantable device outside of the clinic excluding No Patient Has Alerts: No cellular tissue based products placed in the center since last visit: Has Dressing in Place as Prescribed: Yes Pain Present Now: No Electronic Signature(s) Signed: 08/06/2022 5:59:56 PM By: Sharyn Creamer RN, BSN Entered By: Sharyn Creamer on 08/06/2022 09:14:51 -------------------------------------------------------------------------------- Encounter Discharge Information Details Patient Name: Date of Service: [name], [name] YE 08/06/2022 9:30 A M Medical Record Number: [id number] Patient Account Number: [id number] Date of Birth/Sex: Treating RN: 1950-11-29 (71 y.o. [name] Primary Care Gryffin Altice: [name] Other Clinician: Referring Shawnya Mayor: Treating [name]/Extender: [name], [name]: [phone] Encounter Discharge Information Items  Post Procedure Vitals Discharge Condition: Stable Temperature (F): 97.9 Ambulatory Status: Ambulatory Pulse (bpm): 77 Discharge Destination: Home Respiratory Rate (breaths/min): 20 Transportation: Private Auto Blood Pressure (mmHg): 180/80 Accompanied By: self Schedule Follow-up Appointment: Yes Clinical Summary of Care: Electronic Signature(s) Signed: 08/06/2022 6:57:47 PM By: [name] RN, BSN Entered By: [name] on 08/06/2022 09:56:04 [name] ([id number]) [id number] Page 2 of 6 -------------------------------------------------------------------------------- Lower Extremity Assessment Details Patient Name: Date of Service: [name] YE 08/06/2022 9:30 A M Medical Record Number: [id number] Patient Account Number: [id number] Date of Birth/Sex: Treating RN: [birth year] (71 y.o. [name] Primary Care Josephmichael Lisenbee: [name] Other Clinician: Referring Lucianna Ostlund: Treating [name]/Extender: [name], [name]: 102 Electronic Signature(s) Signed: 08/06/2022 5:59:56 PM By: Sharyn Creamer RN, BSN Entered By: Sharyn Creamer on 08/06/2022 09:16:06 -------------------------------------------------------------------------------- Multi-Disciplinary Care Plan Details Patient Name: Date of Service: [name], [name] YE 08/06/2022 9:30 A M Medical Record Number: [id number] Patient Account Number: [id number] Date of Birth/Sex: Treating RN: 02-20-1951 (71 y.o. [name] Primary Care Kalyani Maeda: [name] Other Clinician: Referring Timiyah Romito: Treating [name]/Extender: [name], [name] Beams in [name]: [place] reviewed with physician Active Inactive Wound/Skin Impairment Nursing Diagnoses: Impaired tissue integrity Knowledge deficit related to ulceration/compromised skin integrity Goals: Patient/caregiver will verbalize understanding of skin care  regimen Date Initiated: 08/22/2020 Target Resolution Date: 10/10/2022 Goal Status: Active Ulcer/skin breakdown will have a volume reduction of 30% by week 4 Date Initiated: 08/22/2020 Date Inactivated: 10/03/2020 Target Resolution Date: 09/19/2020 Goal Status: Met Ulcer/skin breakdown will have a volume reduction of 50% by week 8 Date Initiated: 06/05/2021 Date Inactivated: 07/17/2021 Target Resolution Date: 07/03/2021 Goal Status: Unmet Unmet Reason: infection Interventions: Assess patient/caregiver ability to obtain necessary supplies Assess patient/caregiver ability to perform ulcer/skin care regimen upon admission and as needed Assess ulceration(s) every visit [name] Activities: Skin care regimen initiated : 08/22/2020 Topical wound management initiated : 08/22/2020 Notes: 06/05/21: Wound care regimen ongoing. 06/11/22: Wound care regimen  continues, applying skin sub ([place]) Electronic Signature(s) Signed: 08/06/2022 6:57:47 PM By: [name] RN, BSN Entered By: [name] on 08/06/2022 09:52:26 [name] ([id number]) [id number] Page 3 of 6 -------------------------------------------------------------------------------- Pain Assessment Details Patient Name: Date of Service: [name] YE 08/06/2022 9:30 A M Medical Record Number: [id number] Patient Account Number: [id number] Date of Birth/Sex: Treating RN: January 07, 1951 (71 y.o. [name] Primary Care Daymeon Fischman: [name] Other Clinician: Referring Jabria Loos: Treating Anairis Knick/Extender: [name], [name] in [name]: [phone] Active Problems Location of Pain Severity and Description of Pain Patient Has Paino No Site Locations Pain Management and Medication Current Pain Management: Electronic Signature(s) Signed: 08/06/2022 5:59:56 PM By: Sharyn Creamer RN, BSN Entered By: Sharyn Creamer on 08/06/2022  09:15:59 -------------------------------------------------------------------------------- Patient/Caregiver Education Details Patient Name: Date of Service: [name], [name] YE 10/25/2023andnbsp9:30 A M Medical Record Number: [id number] Patient Account Number: [id number] Date of Birth/Gender: Treating RN: January 03, 1951 (71 y.o. [name] Primary Care Physician: [name] Other Clinician: Referring Physician: Treating Physician/Extender: Lowry Ram in [name]: [phone] Education Assessment Education Provided To: Patient Education Topics Provided Hyperbaric Oxygenation: Handouts: Hyperbaric Oxygen Methods: Explain/Verbal [name], [name] ([id number]) [id number] Page 4 of 6 Responses: Reinforcements needed Electronic Signature(s) Signed: 08/06/2022 6:57:47 PM By: [name] RN, BSN Entered By: [name] on 08/06/2022 18:56:11 -------------------------------------------------------------------------------- Wound Assessment Details Patient Name: Date of Service: [name] YE 08/06/2022 9:30 A M Medical Record Number: [id number] Patient Account Number: [id number] Date of Birth/Sex: Treating RN: 1950-12-17 (71 y.o. [name] Primary Care Bishop Vanderwerf: [name] Other Clinician: Referring Mescal Flinchbaugh: Treating Nat Lowenthal/Extender: [name], [name]: 102 Wound Status Wound Number: 1 Primary Soft Tissue Radionecrosis Etiology: Wound Location: Right Breast (mastectomy site) Wound Open Wounding Event: Radiation Burn Status: Date Acquired: 07/26/2020 Comorbid Anemia, Lymphedema, Deep Vein Thrombosis, Hypertension, Weeks Of [name]: 102 History: Peripheral Venous Disease, Osteoarthritis, Received Clustered Wound: No Chemotherapy, Received Radiation, Confinement Anxiety Photos Wound Measurements Length: (cm) 1.8 Width: (cm) 3 Depth: (cm) 0.3 Area: (cm) 4.241 Volume: (cm)  1.272 % Reduction in Area: -176.8% % Reduction in Volume: -731.4% Epithelialization: None Tunneling: No Undermining: Yes Starting Position (o'clock): 12 Ending Position (o'clock): 10 Maximum Distance: (cm) 0.3 Wound Description Classification: Full Thickness Without Exposed Suppor Wound Margin: Distinct, outline attached Exudate Amount: Medium Exudate Type: Serosanguineous Exudate Color: red, brown t Structures Foul Odor After Cleansing: No Slough/Fibrino Yes Wound Bed Granulation Amount: Large (67-100%) Exposed Structure Granulation Quality: Red, Friable Fascia Exposed: No Necrotic Amount: Small (1-33%) Fat Layer (Subcutaneous Tissue) Exposed: Yes Necrotic Quality: Adherent Slough Tendon Exposed: No Muscle Exposed: No Joint Exposed: No Bone Exposed: No [name] ([id number]) [id number] Page 5 of 6 Periwound Skin Texture Texture Color No Abnormalities Noted: Yes No Abnormalities Noted: No Atrophie Blanche: No Moisture Cyanosis: No No Abnormalities Noted: Yes Ecchymosis: No Erythema: No Hemosiderin Staining: No Mottled: No Pallor: No Rubor: No Temperature / Pain Temperature: No Abnormality [name] Notes Wound #1 (Breast (mastectomy site)) Wound Laterality: Right Cleanser Soap and Water Discharge Instruction: May shower and wash wound with dial antibacterial soap and water prior to dressing change. Peri-Wound Care Skin Prep Discharge Instruction: Use skin prep as directed Topical Primary Dressing Cutimed Sorbact Swab Discharge Instruction: Apply to wound bed as instructed Secondary Dressing ALLEVYN Gentle Border, 5x5 (in/in) Discharge Instruction: Apply over primary dressing as directed. Woven Gauze Sponges 2x2 in Discharge Instruction: Apply over primary dressing, fold in corners to fill the space Secured With Compression Wrap Compression  Stockings Environmental education officer) Signed: 08/06/2022 5:59:56 PM By: Sharyn Creamer RN, BSN Entered By: Sharyn Creamer on 08/06/2022 09:23:58 -------------------------------------------------------------------------------- Vitals Details Patient Name: Date of Service: [name] YE 08/06/2022 9:30 A M Medical Record Number: [id number] Patient Account Number: [id number] Date of Birth/Sex: Treating RN: 1951-07-29 (71 y.o. [name] Primary Care Dolorez Jeffrey: [name] Other Clinician: Referring Markeith Jue: Treating Evander Macaraeg/Extender: [name], [name]: 102 Vital Signs Time Taken: 09:15 Temperature (F): 97.9 Height (in): 63 Pulse (bpm): 77 Weight (lbs): 176 Respiratory Rate (breaths/min): 18 Body Mass Index (BMI): 31.2 Blood Pressure (mmHg): 180/80 Reference Range: 80 - 120 mg / dl [name], [name] ([id number]) [id number] Page 6 of 6 Electronic Signature(s) Signed: 08/06/2022 5:59:56 PM By: Sharyn Creamer RN, BSN Entered By: Sharyn Creamer on 08/06/2022 09:15:52

## 2022-08-08 NOTE — Progress Notes (Signed)
[name], [name] ([id number]) [id number] Page 1 of 10 Visit Report for 07/30/2022 Cellular or Tissue Based Product Details Patient Name: Date of Service: [name], [name] 07/30/2022 8:45 A M Medical Record Number: [id number] Patient Account Number: [id number] Date of Birth/Sex: Treating RN: October 01, 1951 (72 y.o. [name] Primary Care Provider: [name] Other Clinician: Referring Provider: Treating Provider/Extender: Baruch Gouty in Treatment: [phone] Cellular or Tissue Based Product Type Wound #1 Right Breast (mastectomy site) Applied to: Performed By: Physician Ricard Dillon., MD Cellular or Tissue Based Product Type: Epicord Level of Consciousness (Pre-procedure): Awake and Alert Pre-procedure Verification/Time Out Yes - 09:09 Taken: Location: trunk / arms / legs Wound Size (sq cm): 3.36 Product Size (sq cm): 6 Waste Size (sq cm): 0 Amount of Product Applied (sq cm): 6 Instrument Used: Forceps, Scissors Lot #: [id number] Expiration Date: 07/30/2022 Reconstituted: Yes Solution Type: normal saline Solution Amount: 53m Lot #: [id number] Expiration Date: 02/09/2025 Secured: No Dressing Applied: Yes Primary Dressing: sorbact, gauze, allevyn Procedural Pain: 0 Post Procedural Pain: 0 Response to Treatment: Procedure was tolerated well Level of Consciousness (Post- Awake and Alert procedure): Post Procedure Diagnosis Same as Pre-procedure Electronic Signature(s) Signed: 07/30/2022 4:28:13 PM By: RLinton HamMD Entered By: RLinton Hamon 07/30/2022 09:16:39 -------------------------------------------------------------------------------- Debridement Details Patient Name: Date of Service: [name], [name] 07/30/2022 8:45 A M Medical Record Number: [id number] Account Number: [id number] of Birth/Sex: Treating RN: 201/23/1952((71y.o. FSue LushPrimary Care Provider: HMaryella ShiversOther Clinician: Referring Provider: Treating Provider/Extender: RAdolm Joseph FCathie Beamsin Treatment: 101 Debridement Performed for Assessment: Wound #1 Right Breast (mastectomy site) Performed By: Physician RRicard Dillon, MD Debridement Type: Debridement Level of Consciousness (Pre-procedure): Awake and Alert Pre-procedure Verification/Time Out Yes - 08:58 GSHALAE, BELMONTE([id number] Page 2 of 10 Yes - 08:58 Taken: Start Time: 08:58 Pain Control: Lidocaine 5% topical ointment T Area Debrided (L x W): otal 1.2 (cm) x 2.8 (cm) = 3.36 (cm) Tissue and other material debrided: Non-Viable, Slough, Subcutaneous, Slough Level: Skin/Subcutaneous Tissue Debridement Description: Excisional Instrument: Curette Bleeding: Minimum Hemostasis Achieved: Pressure Procedural Pain: 0 Post Procedural Pain: 0 Response to Treatment: Procedure was tolerated well Level of Consciousness (Post- Awake and Alert procedure): Post Debridement Measurements of Total Wound Length: (cm) 1.2 Width: (cm) 2.8 Depth: (cm) 0.3 Volume: (cm) 0.792 Character of Wound/Ulcer Post Debridement: Improved Post Procedure Diagnosis Same as Pre-procedure Notes Procedure scribed for Dr RDellia Nimsby C. PPhineas Douglas RN Electronic Signature(s) Signed: 07/30/2022 4:28:13 PM By: RLinton HamMD Signed: 08/08/2022 1:21:31 PM By: BLorrin JacksonEntered By: RLinton Hamon 07/30/2022 09:08:12 -------------------------------------------------------------------------------- HPI Details Patient Name: Date of Service: [name], [name] 07/30/2022 8:45 A M Medical Record Number: [id number] Account Number: [id number] of Birth/Sex: Treating RN: 219[birth year]((71y.o. FSue LushPrimary Care Provider: HMaryella ShiversOther Clinician: Referring Provider: Treating Provider/Extender: RAdolm Joseph FCathie Beamsin Treatment: 101 History of  Present Illness HPI Description: 08/22/2020 upon evaluation today patient actually appears to be doing somewhat poorly in regard to her mastectomy site on the right chest wall. She had a mastectomy initially in 1998. She had chemotherapy only no radiation following. In 2002 she subsequently did undergo radiation for the first time and then subsequently in 2012 had repeat radiation after having had a finding that was consistent with a relapse of the cancer. 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 She also  has radiation damage to the skin over the right chest wall where she had the mastectomy. She was referred to [place] by Dr. Matthew Saras in [facility] and her oncologist is Dr. Burney Gauze. Subsequently I want to make sure before we delve into this more deeply that the patient does not have any issues with a return of cancer that needs to be managed by them. 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 Nonetheless she does not remember exactly when her last PET scan was but it was not too recently she tells me. 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 The patient does have hypertension as well. She sees her oncologist next on December 6. 09/12/2020 on evaluation today patient actually appears to be doing excellent in regard to her wound under the right breast location. Currently this is measuring smaller in general seems to be doing very well. She tells me that the collagen is doing a good job and that the dressing that she has been putting on is not causing any troubles as far pulling on her skin or scar tissue is concerned all of which is excellent news. 10/03/2020 upon evaluation today patient appears to be doing well with regard to her surgical site from her mastectomy on the right breast  region. She tells me that she has had very little bleeding nothing seems to be sticking badly and in general she has been extremely pleased with where things stand today. No fevers, chills, nausea, vomiting, or diarrhea. 10/24/2020 upon evaluation today patient actually appears to be doing excellent in regard to her wound. There is just a very small area still open and to be honest there was minimal slough noted on the surface of the wound nothing that requires sharp debridement. In general I feel like she is actually doing quite well and overall I am pleased. I do think we may switch to silver alginate dressing and also contemplating using a AandE ointment in order to help keep everything moist and the scar tissue region while the alginate keeps it dry enough to heal 11/14/2020 upon evaluation today patient appears to be doing well at this point in regard to her wound. I do feel like that she is making good progress again this is a very difficult region over the surgical site of the right chest wall at the site of mastectomy. Nonetheless I think that we are just a very small area still open I SANOE, HAZAN ([id number]) [id number] Page 3 of 10 think alginate is doing a good job helping to dry this up and I would recommend this such that we continue with this. 01/02/2021 on evaluation today patient's wound actually appears to be doing decently well. There does not appear to be any signs of active infection which is great news. She does have some slight slough buildup with biofilm on the surface of the wound mainly more biofilm. Nonetheless I was able to gently remove this with saline and gauze as well as a sterile Q-tip. She tolerated that today without complication. 01/30/2021 upon evaluation today patient appears to be doing well with regard to her wound although she still has quite a bit of trouble getting this to close I think the scar tissue and radiation damage is quite  significant here unfortunately. There does not appear to be any signs of active  infection which is great news. No fevers, chills, nausea, vomiting, or diarrhea. 02/27/2021 upon evaluation today patient appears to be doing excellent in regard to her wound. She has been tolerating the dressing changes without complication and in general I am extremely pleased with where things stand today. There does not appear to be any signs of infection which is great news. No fevers, chills, nausea, vomiting, or diarrhea. With that being said I do think that she still developing some slough buildup. I did discuss with her today the possibility of proceeding with HBO therapy. We have had this discussion before but she really is not quite committed to wanting to do that much and spend that much time in the chamber. She wants to still think about it. 03/27/2021 upon evaluation today patient appears to be doing about the same in regard to her wound. She still developing a lot of slough buildup on the surface of the wound. I did try to clean that away to some degree today. She tolerated that without any pain or complication. Subsequently I am thinking we may want to switch over to [facility] see if this may do better for her. Patient is in agreement with giving that a trial. 05/01/2021 upon evaluation today patient appears to be doing about the same in regard to her wounds. She has been tolerating the dressing changes without complication. Fortunately there does not appear to be any signs of active infection at this time which is great news. No fevers, chills, nausea, vomiting, or diarrhea.. 06/05/2021 upon evaluation today patient appears to be doing pretty well currently in regard to her wound at the mastectomy site right chest wall. Overall since we switch back to the alginate she tells me things have significantly improved she is much happier with where things stand currently. Fortunately there does not appear to be any  signs of active infection which is great news. No fevers, chills, nausea, vomiting, or diarrhea. 07/10/2021 upon evaluation today patient unfortunately does not appear to be doing nearly as well as what she previously was. There does not appear to be any evidence of new epithelial growth she has a lot of necrotic tissue the base of the wound this is much more significant than what we previously noted. She also has been having increased pain and increased drainage. In general I am very concerned about this especially in light of the fact that she does have a history of breast cancer I want to ensure that were not looking at any cancerous type lesion at this point. Otherwise also think she does need some debridement we did do MolecuLight scanning today. 07/17/2021 upon evaluation today patient appears to be doing well with regard to her wound compared to last week although she still having some erythema I am concerned in this regard. I do think that she fortunately had a negative biopsy which is great news unfortunately she did have Pseudomonas noted as a bacteria present in the culture which I think is good and need to be addressed with Levaquin. I Minna send that into the pharmacy today. We did repeat the MolecuLight screening. 07/31/2021 upon evaluation today patient's wound is actually showing signs of improvement which is great news. Fortunately there does not appear to be any signs of infection currently locally nor systemically and I am very pleased in that regard. With that being said the patient is having some issues here with continued necrotic tissue I think packing with the Dakin's moistened gauze dressing is still in  the right leg ago. 08/14/2021 upon evaluation today patient appears to be doing well with regard to her wound all things considered I do not see any signs of significant infection which is great news. Fortunately there does not appear to be any evidence of infection which is also  great news. In general I think that the patient is tolerating the dressing changes well. We been using a Dakin's moistened gauze. 08/28/2021 upon evaluation today patient appears to be doing well with regard to her wound. Worsening signs of definite improvement which is great news and overall very pleased with where we stand today. There is no signs of inflamed tissue or infection which is great as well and I think the Dakin's is doing a great job here. 09/11/2021 upon evaluation today patient appears to be doing well with regard to her wound all things considered. I am can perform some debridement today to clear away some of the necrotic debris with that being said overall I think that she is making decently good progress here which is great news. There does not appear to be any signs of active infection also good news. That in fact is probably the best news in her mind. 09/25/2021 upon evaluation today patient appears to be doing decently well in regard to her wound. Overall I do not see any signs of infection and I think she is doing quite well. I do believe that we are making headway here although is very slow due to the scarring and the depth of the wound this is a significantly deep wound. 10/16/2021 upon evaluation today patient appears to be doing about the same in regard to the wound on her right chest wall. Fortunately there does not appear to be any signs of active infection locally nor systemically which is great news. With that being said this still was not cleaning up quite as quickly as I would like to see. Fortunately I think that we can definitely give the Santyl another try we tried this in the past and unfortunately did not have a good result but again I think she was infected at that time as well which was part of the issue. Nonetheless I think currently that we will be able to go ahead and see what we can do about getting this little cleaner with the Santyl. 11/06/2021 upon  evaluation today patient appears to be doing well with regard to her wound. This actually appears to be a little bit better with the Santyl I am happy in that regard. Fortunately I do not see any signs of active infection at this time. No fevers, chills, nausea, vomiting, or diarrhea. 12/04/2021 upon evaluation patient appears to be doing well with the Santyl at this point. I am very pleased with where we stand and I think that she is making good progress here. Fortunately I do not see any evidence of active infection locally or systemically at this time which is great news. No fevers, chills, nausea, vomiting, or diarrhea. 01/01/2022 upon evaluation today patient actually is making excellent progress with the Santyl. I am extremely pleased with where we stand today. I do not see any signs of infection. 01-29-2022 upon evaluation today patient appears to be doing well with regard to her wound. This is showing signs of some improvement. It is a little less deep than what it was. Size wise is also slightly smaller but again there still is a significant wound in this area. Fortunately I do not see any evidence of  infection and she is doing quite well. 02-26-2022 upon evaluation today patient appears to be doing well with regard to her wound. In fact this is actually the best that I have seen in a very long time. I do not see any evidence of active infection at this time locally or systemically which is great news and overall I think we are on the right track. Nonetheless I do believe that the patient is continuing to show evidence of improvement with the Santyl week by week. 03-26-2022 upon evaluation today patient appears to be doing well with regard to the wound on her right mastectomy site. She is actually showing signs of excellent improvement in fact there was some bleeding just with a little bit of light cleaning over the area. Fortunately I do not see any signs of active infection locally or  systemically at this time which is great news. 04-30-2022 upon evaluation today patient appears to be doing well currently in regard to her wound. She is actually showing some signs of improvement here which is great news. Still this is very very slow. Subsequently I do believe that she may benefit from the use of [place] topical antibiotics for short amount of time before applying a skin substitute I think EpiCord could be doing very well for her as far as that is concerned. I discussed that with her today. Fortunately I do not see any evidence of active infection locally or systemically at this time which is great news. No fevers, chills, nausea, vomiting, or diarrhea. LEE, KALT ([id number]) [id number] Page 4 of 10 05-28-2022 upon evaluation today patient's wound is actually showing signs of significant improvement. Fortunately I do not see any evidence of infection at this time which is great news. No fevers, chills, nausea, vomiting, or diarrhea. With that being said I do believe that the patient is tolerating the dressing changes without complication. We been using the [facility] which I think is helpful. We do have approval for the [place]. 06-11-2022 upon evaluation today patient appears to be doing well with regard to her wound she is actually here today for the first application of [place] which I think is good to be beneficial for her. I do think that we will probably be able to keep this in place without can be the one thing of question to be honest. 06-18-2022 upon evaluation today patient actually appears to be doing excellent today. She has 1 treatment of the EpiCord underway and the wound surface already looks much better. This will be EpiCord #2 today. Fortunately I think we are on the right track here. 06-25-2022 upon evaluation today patient appears to be making good progress and overall the patient seems to have tolerated EpiCord without any complication. I do  feel like that the wound is improving quite significantly. This is EpiCord #3 today. 07-02-2021 upon evaluation today patient's wound is actually showing signs of improvement. I am actually very pleased with where we stand we have been seeing some improvements in size overall and I think that we are still in the right track although there is a little bit of need for sharp debridement today to clearway some of the necrotic debris and remaining EpiCord which is actually still somewhat adhered to the wound bed. Overall I am extremely pleased though with where we stand. This is EpiCord #4 today. Upon evaluation today patient appears to be doing well with regard to her wound the EpiCord is definitely helping and does seem to be improving the overall  status of the wound the surface is dramatically improved compared to what it was at the start of this. The tissue is actually becoming more red than it is just a pale pink and to be honest some of the did not even appear to be pink in the start. Nonetheless I am very pleased with where we stand currently. No fevers, chills, nausea, vomiting, or diarrhea. 07-16-2022 upon evaluation today patient appears to be doing well currently in regard to her wound she is doing well with the [place] this is application #6 today. 07-23-2022 upon evaluation today patient's wound actually showing signs of excellent improvement which is great news. Fortunately I think were making progress again this is a very scarred area which now has a pretty good vascular supply which is great news. Overall I think that she is making great progress. This is EpiCord #7 today 10/18; Epicort No. 8. Very minimally smaller. Wounds on the right breast mastectomy site complicated by soft tissue radiation damage. Unfortunately she lives in [place], 5 days a week transport would be impossible for her to arrange Electronic Signature(s) Signed: 07/30/2022 4:28:13 PM By: Linton Ham MD Entered By:  Linton Ham on 07/30/2022 09:09:32 -------------------------------------------------------------------------------- Physical Exam Details Patient Name: Date of Service: [name], [name] 07/30/2022 8:45 A M Medical Record Number: [id number] Patient Account Number: [id number] Date of Birth/Sex: Treating RN: 05/01/1951 (71 y.o. [name] Primary Care Provider: [name] Other Clinician: Referring Provider: Treating Provider/Extender: Adolm Joseph, [name] in Treatment: 101 Constitutional Patient is hypertensive.. Pulse regular and within target range for patient.Marland Kitchen Respirations regular, non-labored and within target range.. Temperature is normal and within the target range for the patient.Marland Kitchen Appears in no distress. Notes Wound exam; inferior quadrant of the right breast area. Fairly large wound. Nonviable surface debrided with a #5 curette removing slough and subcutaneous tissue. Hemostasis with direct pressure. Epicort applied in standard fashion. There is tunneling from 11-12 o'clock probably about 0.5 cm Electronic Signature(s) Signed: 07/30/2022 4:28:13 PM By: Linton Ham MD Entered By: Linton Ham on 07/30/2022 09:10:33 -------------------------------------------------------------------------------- Physician Orders Details Patient Name: Date of Service: [name], [name] 07/30/2022 8:45 A M Medical Record Number: [id number] Patient Account Number: [id number] Date of Birth/Sex: Treating RN: 02/12/1951 (71 y.o. [name] Primary Care Provider: [name] Other Clinician: [name] ([id number]) [id number] Page 5 of 10 Referring Provider: Treating Provider/Extender: Adolm Joseph, [name] in Treatment: [phone] Verbal / Phone Orders: No Diagnosis Coding ICD-10 Coding Code Description L98.492 Non-pressure chronic ulcer of skin of other sites with fat layer exposed L58.9 Radiodermatitis,  unspecified L59.8 Other specified disorders of the skin and subcutaneous tissue related to radiation I10 Essential (primary) hypertension Follow-up Appointments ppointment in 1 week. - with Orland Jarred, RN (Room 7) Return A Anesthetic (In clinic) Topical Lidocaine 5% applied to wound bed (In clinic) Topical Lidocaine 4% applied to wound bed Cellular or Tissue Based Products Cellular or Tissue Based Product Type: - Order Epicord for application on 9/38/18=299% covered daptic or Mepitel. (DO NOT REMOVE). - Cellular or Tissue Based Product applied to wound bed, secured with steri-strips, cover with A Epicord #1 06/11/22, #2 06/18/22, #3 06/25/22, #4 07/02/22, #5 07/09/22, #6 07/16/22, Epicord #7 07/23/22, Epicord #8 07/30/22 Bathing/ Shower/ Hygiene May shower with protection but do not get wound dressing(s) wet. Additional Orders / Instructions Follow Nutritious Diet Wound Treatment Wound #1 - Breast (mastectomy site) Wound Laterality: Right Cleanser: Soap and Water 1 x Per Day/30 Days Discharge  Instructions: May shower and wash wound with dial antibacterial soap and water prior to dressing change. Peri-Wound Care: Skin Prep (Generic) 1 x Per Day/30 Days Discharge Instructions: Use skin prep as directed Prim Dressing: Cutimed Sorbact Swab 1 x Per Day/30 Days ary Discharge Instructions: Apply to wound bed as instructed Secondary Dressing: ALLEVYN Gentle Border, 5x5 (in/in) 1 x Per Day/30 Days Discharge Instructions: Apply over primary dressing as directed. Secondary Dressing: Woven Gauze Sponges 2x2 in (Generic) 1 x Per Day/30 Days Discharge Instructions: Apply over primary dressing, fold in corners to fill the space Patient Medications llergies: Iodinated Contrast Media A Notifications Medication Indication Start End prior to debridement 07/30/2022 lidocaine DOSE topical 5 % ointment - ointment topical Electronic Signature(s) Signed: 07/30/2022 4:28:13 PM By: Linton Ham  MD Signed: 07/30/2022 5:13:40 PM By: Sharyn Creamer RN, BSN Entered By: Sharyn Creamer on 07/30/2022 09:16:51 -------------------------------------------------------------------------------- Problem List Details Patient Name: Date of Service: [name], [place] YE 07/30/2022 8:45 A Edd Fabian ([id number]) [id number] Page 6 of 10 Medical Record Number: [id number] Patient Account Number: [id number] Date of Birth/Sex: Treating RN: Jul 21, 1951 (71 y.o. [name] Primary Care Provider: [name] Other Clinician: Referring Provider: Treating Provider/Extender: Adolm Joseph, [name] in Treatment: [phone] Active Problems ICD-10 Encounter Code Description Active Date MDM Diagnosis [phone] Non-pressure chronic ulcer of skin of other sites with fat layer exposed 08/22/2020 No Yes L58.9 Radiodermatitis, unspecified 08/22/2020 No Yes L59.8 Other specified disorders of the skin and subcutaneous tissue related to 08/22/2020 No Yes radiation I10 Essential (primary) hypertension 08/22/2020 No Yes Inactive Problems Resolved Problems Electronic Signature(s) Signed: 07/30/2022 4:28:13 PM By: Linton Ham MD Entered By: Linton Ham on 07/30/2022 09:07:58 -------------------------------------------------------------------------------- Progress Note Details Patient Name: Date of Service: [name], [name] 07/30/2022 8:45 A M Medical Record Number: [id number] Patient Account Number: [id number] Date of Birth/Sex: Treating RN: [birth year] (71 y.o. [name] Primary Care Provider: [name] Other Clinician: Referring Provider: Treating Provider/Extender: Adolm Joseph, [name] in Treatment: 101 Subjective History of Present Illness (HPI) 08/22/2020 upon evaluation today patient actually appears to be doing somewhat poorly in regard to her mastectomy site on the right chest wall. She had a mastectomy initially in  1998. She had chemotherapy only no radiation following. In 2002 she subsequently did undergo radiation for the first time and then subsequently in 2012 had repeat radiation after having had a finding that was consistent with a relapse of the cancer. 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 She also has radiation damage to the skin over the right chest wall where she had the mastectomy. She was referred to [place] by Dr. Matthew Saras in [facility] and her oncologist is Dr. Burney Gauze. Subsequently I want to make sure before we delve into this more deeply that the patient does not have any issues with a return of cancer that needs to be managed by them. 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 Nonetheless she does not remember exactly when her last PET scan was but it was not too recently she tells me. 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 The patient does have hypertension as well. She sees her oncologist next on December 6. 09/12/2020 on evaluation today patient actually appears to be doing excellent in regard to her  wound under the right breast location. Currently this is measuring smaller in general seems to be doing very well. She tells me that the collagen is doing a good job and that the dressing that she has been putting on is not causing any troubles as far pulling on her skin or scar tissue is concerned all of which is excellent news. 10/03/2020 upon evaluation today patient appears to be doing well with regard to her surgical site from her mastectomy on the right breast region. She tells me that she has had very little bleeding nothing seems to be sticking badly and in general she has been extremely pleased with where things stand today. No fevers, chills, nausea, vomiting, or  diarrhea. 10/24/2020 upon evaluation today patient actually appears to be doing excellent in regard to her wound. There is just a very small area still open and to be honest there was minimal slough noted on the surface of the wound nothing that requires sharp debridement. In general I feel like she is actually doing quite FRANKI, ALCAIDE ([id number]) [id number] Page 7 of 10 well and overall I am pleased. I do think we may switch to silver alginate dressing and also contemplating using a AandE ointment in order to help keep everything moist and the scar tissue region while the alginate keeps it dry enough to heal 11/14/2020 upon evaluation today patient appears to be doing well at this point in regard to her wound. I do feel like that she is making good progress again this is a very difficult region over the surgical site of the right chest wall at the site of mastectomy. Nonetheless I think that we are just a very small area still open I think alginate is doing a good job helping to dry this up and I would recommend this such that we continue with this. 01/02/2021 on evaluation today patient's wound actually appears to be doing decently well. There does not appear to be any signs of active infection which is great news. She does have some slight slough buildup with biofilm on the surface of the wound mainly more biofilm. Nonetheless I was able to gently remove this with saline and gauze as well as a sterile Q-tip. She tolerated that today without complication. 01/30/2021 upon evaluation today patient appears to be doing well with regard to her wound although she still has quite a bit of trouble getting this to close I think the scar tissue and radiation damage is quite significant here unfortunately. There does not appear to be any signs of active infection which is great news. No fevers, chills, nausea, vomiting, or diarrhea. 02/27/2021 upon evaluation today patient appears to be  doing excellent in regard to her wound. She has been tolerating the dressing changes without complication and in general I am extremely pleased with where things stand today. There does not appear to be any signs of infection which is great news. No fevers, chills, nausea, vomiting, or diarrhea. With that being said I do think that she still developing some slough buildup. I did discuss with her today the possibility of proceeding with HBO therapy. We have had this discussion before but she really is not quite committed to wanting to do that much and spend that much time in the chamber. She wants to still think about it. 03/27/2021 upon evaluation today patient appears to be doing about the same in regard to her wound. She still developing a lot of slough buildup on the surface of the wound. I  did try to clean that away to some degree today. She tolerated that without any pain or complication. Subsequently I am thinking we may want to switch over to [facility] see if this may do better for her. Patient is in agreement with giving that a trial. 05/01/2021 upon evaluation today patient appears to be doing about the same in regard to her wounds. She has been tolerating the dressing changes without complication. Fortunately there does not appear to be any signs of active infection at this time which is great news. No fevers, chills, nausea, vomiting, or diarrhea.. 06/05/2021 upon evaluation today patient appears to be doing pretty well currently in regard to her wound at the mastectomy site right chest wall. Overall since we switch back to the alginate she tells me things have significantly improved she is much happier with where things stand currently. Fortunately there does not appear to be any signs of active infection which is great news. No fevers, chills, nausea, vomiting, or diarrhea. 07/10/2021 upon evaluation today patient unfortunately does not appear to be doing nearly as well as what she previously  was. There does not appear to be any evidence of new epithelial growth she has a lot of necrotic tissue the base of the wound this is much more significant than what we previously noted. She also has been having increased pain and increased drainage. In general I am very concerned about this especially in light of the fact that she does have a history of breast cancer I want to ensure that were not looking at any cancerous type lesion at this point. Otherwise also think she does need some debridement we did do MolecuLight scanning today. 07/17/2021 upon evaluation today patient appears to be doing well with regard to her wound compared to last week although she still having some erythema I am concerned in this regard. I do think that she fortunately had a negative biopsy which is great news unfortunately she did have Pseudomonas noted as a bacteria present in the culture which I think is good and need to be addressed with Levaquin. I Minna send that into the pharmacy today. We did repeat the MolecuLight screening. 07/31/2021 upon evaluation today patient's wound is actually showing signs of improvement which is great news. Fortunately there does not appear to be any signs of infection currently locally nor systemically and I am very pleased in that regard. With that being said the patient is having some issues here with continued necrotic tissue I think packing with the Dakin's moistened gauze dressing is still in the right leg ago. 08/14/2021 upon evaluation today patient appears to be doing well with regard to her wound all things considered I do not see any signs of significant infection which is great news. Fortunately there does not appear to be any evidence of infection which is also great news. In general I think that the patient is tolerating the dressing changes well. We been using a Dakin's moistened gauze. 08/28/2021 upon evaluation today patient appears to be doing well with regard to her  wound. Worsening signs of definite improvement which is great news and overall very pleased with where we stand today. There is no signs of inflamed tissue or infection which is great as well and I think the Dakin's is doing a great job here. 09/11/2021 upon evaluation today patient appears to be doing well with regard to her wound all things considered. I am can perform some debridement today to clear away some of the necrotic  debris with that being said overall I think that she is making decently good progress here which is great news. There does not appear to be any signs of active infection also good news. That in fact is probably the best news in her mind. 09/25/2021 upon evaluation today patient appears to be doing decently well in regard to her wound. Overall I do not see any signs of infection and I think she is doing quite well. I do believe that we are making headway here although is very slow due to the scarring and the depth of the wound this is a significantly deep wound. 10/16/2021 upon evaluation today patient appears to be doing about the same in regard to the wound on her right chest wall. Fortunately there does not appear to be any signs of active infection locally nor systemically which is great news. With that being said this still was not cleaning up quite as quickly as I would like to see. Fortunately I think that we can definitely give the Santyl another try we tried this in the past and unfortunately did not have a good result but again I think she was infected at that time as well which was part of the issue. Nonetheless I think currently that we will be able to go ahead and see what we can do about getting this little cleaner with the Santyl. 11/06/2021 upon evaluation today patient appears to be doing well with regard to her wound. This actually appears to be a little bit better with the Santyl I am happy in that regard. Fortunately I do not see any signs of active infection at  this time. No fevers, chills, nausea, vomiting, or diarrhea. 12/04/2021 upon evaluation patient appears to be doing well with the Santyl at this point. I am very pleased with where we stand and I think that she is making good progress here. Fortunately I do not see any evidence of active infection locally or systemically at this time which is great news. No fevers, chills, nausea, vomiting, or diarrhea. 01/01/2022 upon evaluation today patient actually is making excellent progress with the Santyl. I am extremely pleased with where we stand today. I do not see any signs of infection. 01-29-2022 upon evaluation today patient appears to be doing well with regard to her wound. This is showing signs of some improvement. It is a little less deep than what it was. Size wise is also slightly smaller but again there still is a significant wound in this area. Fortunately I do not see any evidence of infection and she is doing quite well. 02-26-2022 upon evaluation today patient appears to be doing well with regard to her wound. In fact this is actually the best that I have seen in a very long time. I do not see any evidence of active infection at this time locally or systemically which is great news and overall I think we are on the right track. Nonetheless I do believe that the patient is continuing to show evidence of improvement with the Santyl week by week. 03-26-2022 upon evaluation today patient appears to be doing well with regard to the wound on her right mastectomy site. She is actually showing signs of excellent improvement in fact there was some bleeding just with a little bit of light cleaning over the area. Fortunately I do not see any signs of active infection locally or systemically at this time which is great news. DIONA, PEREGOY ([id number]) [id number] Page 8 of 10 04-30-2022 upon  evaluation today patient appears to be doing well currently in regard to her wound. She is  actually showing some signs of improvement here which is great news. Still this is very very slow. Subsequently I do believe that she may benefit from the use of [place] topical antibiotics for short amount of time before applying a skin substitute I think EpiCord could be doing very well for her as far as that is concerned. I discussed that with her today. Fortunately I do not see any evidence of active infection locally or systemically at this time which is great news. No fevers, chills, nausea, vomiting, or diarrhea. 05-28-2022 upon evaluation today patient's wound is actually showing signs of significant improvement. Fortunately I do not see any evidence of infection at this time which is great news. No fevers, chills, nausea, vomiting, or diarrhea. With that being said I do believe that the patient is tolerating the dressing changes without complication. We been using the [facility] which I think is helpful. We do have approval for the [place]. 06-11-2022 upon evaluation today patient appears to be doing well with regard to her wound she is actually here today for the first application of [place] which I think is good to be beneficial for her. I do think that we will probably be able to keep this in place without can be the one thing of question to be honest. 06-18-2022 upon evaluation today patient actually appears to be doing excellent today. She has 1 treatment of the EpiCord underway and the wound surface already looks much better. This will be EpiCord #2 today. Fortunately I think we are on the right track here. 06-25-2022 upon evaluation today patient appears to be making good progress and overall the patient seems to have tolerated EpiCord without any complication. I do feel like that the wound is improving quite significantly. This is EpiCord #3 today. 07-02-2021 upon evaluation today patient's wound is actually showing signs of improvement. I am actually very pleased with where we stand we have  been seeing some improvements in size overall and I think that we are still in the right track although there is a little bit of need for sharp debridement today to clearway some of the necrotic debris and remaining EpiCord which is actually still somewhat adhered to the wound bed. Overall I am extremely pleased though with where we stand. This is EpiCord #4 today. Upon evaluation today patient appears to be doing well with regard to her wound the EpiCord is definitely helping and does seem to be improving the overall status of the wound the surface is dramatically improved compared to what it was at the start of this. The tissue is actually becoming more red than it is just a pale pink and to be honest some of the did not even appear to be pink in the start. Nonetheless I am very pleased with where we stand currently. No fevers, chills, nausea, vomiting, or diarrhea. 07-16-2022 upon evaluation today patient appears to be doing well currently in regard to her wound she is doing well with the [place] this is application #6 today. 07-23-2022 upon evaluation today patient's wound actually showing signs of excellent improvement which is great news. Fortunately I think were making progress again this is a very scarred area which now has a pretty good vascular supply which is great news. Overall I think that she is making great progress. This is EpiCord #7 today 10/18; Epicort No. 8. Very minimally smaller. Wounds on the right breast  mastectomy site complicated by soft tissue radiation damage. Unfortunately she lives in [place], 5 days a week transport would be impossible for her to arrange Objective Constitutional Patient is hypertensive.. Pulse regular and within target range for patient.Marland Kitchen Respirations regular, non-labored and within target range.. Temperature is normal and within the target range for the patient.Marland Kitchen Appears in no distress. Vitals Time Taken: 8:38 AM, Height: 63 in, Weight: 176 lbs, BMI:  31.2, Temperature: 97.5 F, Pulse: 70 bpm, Respiratory Rate: 18 breaths/min, Blood Pressure: 148/71 mmHg. General Notes: Wound exam; inferior quadrant of the right breast area. Fairly large wound. Nonviable surface debrided with a #5 curette removing slough and subcutaneous tissue. Hemostasis with direct pressure. Epicort applied in standard fashion. There is tunneling from 11-12 o'clock probably about 0.5 cm Integumentary (Hair, Skin) Wound #1 status is Open. Original cause of wound was Radiation Burn. The date acquired was: 07/26/2020. The wound has been in treatment 101 weeks. The wound is located on the Right Breast (mastectomy site). The wound measures 1.2cm length x 2.8cm width x 0.3cm depth; 2.639cm^2 area and 0.792cm^3 volume. There is Fat Layer (Subcutaneous Tissue) exposed. There is no tunneling noted, however, there is undermining starting at 12:00 and ending at 10:00 with a maximum distance of 0.5cm. There is a medium amount of serosanguineous drainage noted. The wound margin is distinct with the outline attached to the wound base. There is large (67-100%) red, friable granulation within the wound bed. There is a small (1-33%) amount of necrotic tissue within the wound bed including Adherent Slough. The periwound skin appearance had no abnormalities noted for texture. The periwound skin appearance had no abnormalities noted for moisture. The periwound skin appearance did not exhibit: Atrophie Blanche, Cyanosis, Ecchymosis, Hemosiderin Staining, Mottled, Pallor, Rubor, Erythema. Periwound temperature was noted as No Abnormality. 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 There was a Excisional Skin/Subcutaneous Tissue Debridement with a total area of 3.36 sq cm performed by Ricard Dillon., MD. With the following instrument(s): Curette to remove Non-Viable tissue/material. Material removed includes Subcutaneous Tissue and Slough and after achieving pain control using Lidocaine 5% topical ointment. No specimens were taken. A time out was conducted at 08:58, prior to the start of the procedure. A Minimum amount of bleeding was controlled with Pressure. The procedure was tolerated well with a pain level of 0 throughout and a pain level of 0 following the procedure. Post Debridement Measurements: 1.2cm length x 2.8cm width x 0.3cm depth; 0.792cm^3 volume. Character of Wound/Ulcer Post Debridement is improved. Post procedure Diagnosis Wound #1: Same as Pre-Procedure General Notes: Procedure scribed for Dr Dellia Nims by C. Phineas Douglas, Therapist, sports. Plan 1. Debridement followed by standard application of epi cord backing Sorbact 2. No evidence of infection 3. It is unfortunate that HBO could not be arranged for this patient. Electronic Signature(s) Signed: 07/30/2022 4:28:13 PM By: Linton Ham MD Entered By: Linton Ham on 07/30/2022 09:11:38 -------------------------------------------------------------------------------- SuperBill Details Patient Name: Date of Service: [name], [place] YE 07/30/2022 Medical Record Number: [id number] Patient Account Number: [id number] Date of Birth/Sex: Treating RN: 1951-04-28 (71 y.o. [name] Primary Care Provider: [name] Other Clinician: Referring Provider: Treating Provider/Extender: Adolm Joseph, [name]  Weeks in Treatment: 101 Diagnosis Coding ICD-10 Codes Code Description L98.492 Non-pressure chronic ulcer of skin of other sites with fat layer exposed L58.9 Radiodermatitis,  unspecified L59.8 Other specified disorders of the skin and subcutaneous tissue related to radiation I10 Essential (primary) hypertension Facility Procedures : The patient participates with Medicare or their insurance follows the Medicare Facility Guidelines: CPT4 Code Description Modifier Quantity [id number] [phone] Epicord 2cm x 3cm - per sqcm 6 : The patient participates with Medicare or their insurance follows the Medicare Facility Guidelines: [id number] - SKIN SUB GRAFT TRNK/ARM/LEG 1 ICD-10 Diagnosis Description L98.492 Non-pressure chronic ulcer of skin of other sites with fat layer  exposed L59.8 Other specified disorders of the skin and subcutaneous tissue related to radiation Physician Procedures : CPT4 Code Description Modifier [id number] - WC PHYS SKIN SUB GRAFT TRNK/ARM/LEG ICD-10 Diagnosis Description L98.492 Non-pressure chronic ulcer of skin of other sites with fat layer exposed L59.8 Other specified disorders of the skin and  subcutaneous tissue related to radiation Quantity: 1 Electronic Signature(s) SHRESHTA, MEDLEY ([id number]) [id number] Page 10 of 10 Signed: 07/30/2022 5:13:40 PM By: Sharyn Creamer RN, BSN Signed: 07/31/2022 4:29:29 PM By: Linton Ham MD Previous Signature: 07/30/2022 4:28:13 PM Version By: Linton Ham MD Entered By: Sharyn Creamer on 07/30/2022 17:00:59

## 2022-08-08 NOTE — Progress Notes (Signed)
[name], [name] ([id number]) [id number] Page 1 of 7 Visit Report for [name] Arrival Information Details Patient Name: Date of Service: [name], [place] [name] 8:45 A M Medical Record Number: [id number] Patient Account Number: [id number] Date of Birth/Sex: Treating RN: April 17, 1951 (71 y.o. [name] Primary Care Moussa Wiegand: [name] Other Clinician: Referring Maryella Abood: Treating Emilyrose Darrah/Extender: Adolm Joseph, [name] in Treatment: 4 Visit Information History Since Last Visit Added or deleted any medications: No Patient Arrived: Ambulatory Any new allergies or adverse reactions: No Arrival Time: 08:35 Had a fall or experienced change in No Accompanied By: self activities of daily living that may affect Transfer Assistance: None risk of falls: Patient Identification Verified: Yes Signs or symptoms of abuse/neglect since last visito No Secondary Verification Process Completed: Yes Hospitalized since last visit: No Patient Requires Transmission-Based Precautions: No Implantable device outside of the clinic excluding No Patient Has Alerts: No cellular tissue based products placed in the center since last visit: Has Dressing in Place as Prescribed: Yes Pain Present Now: No Electronic Signature(s) Signed: 07/30/2022 4:47:05 PM By: Erenest Blank Entered By: Erenest Blank on [name] 08:38:35 -------------------------------------------------------------------------------- Encounter Discharge Information Details Patient Name: Date of Service: [name], [place] [name] 8:45 A M Medical Record Number: [id number] Patient Account Number: [id number] Date of Birth/Sex: Treating RN: 11/26/1950 (71 y.o. [name] Primary Care Cheyane Ayon: [name] Other Clinician: Referring Isabellamarie Randa: Treating Myana Schlup/Extender: Baruch Gouty in Treatment: 101 Encounter Discharge Information Items Post  Procedure Vitals Discharge Condition: Stable Temperature (F): 97.5 Ambulatory Status: Ambulatory Pulse (bpm): 70 Discharge Destination: Home Respiratory Rate (breaths/min): 18 Transportation: Private Auto Blood Pressure (mmHg): 148/71 Accompanied By: self Schedule Follow-up Appointment: Yes Clinical Summary of Care: Patient Declined Electronic Signature(s) Signed: 07/30/2022 5:13:40 PM By: Sharyn Creamer RN, BSN Entered By: Sharyn Creamer on [name] 09:28:45 [name] ([id number]) [id number] Page 2 of 7 -------------------------------------------------------------------------------- Lower Extremity Assessment Details Patient Name: Date of Service: [name], [place] [name] 8:45 A M Medical Record Number: [id number] Patient Account Number: [id number] Date of Birth/Sex: Treating RN: 10-23-1950 (71 y.o. [name] Primary Care Genelle Economou: [name] Other Clinician: Referring Melisa Donofrio: Treating Elleanna Melling/Extender: Adolm Joseph, [name] in Treatment: 101 Electronic Signature(s) Signed: 07/30/2022 4:47:05 PM By: Erenest Blank Signed: 08/08/2022 1:21:31 PM By: Lorrin Jackson Entered By: Erenest Blank on [name] 08:39:12 -------------------------------------------------------------------------------- Multi Wound Chart Details Patient Name: Date of Service: [name], FA [name] 8:45 A M Medical Record Number: [id number] Patient Account Number: [id number] Date of Birth/Sex: Treating RN: 1951/01/16 (71 y.o. [name] Primary Care Kathe Wirick: [name] Other Clinician: Referring Seger Jani: Treating Catrice Zuleta/Extender: Adolm Joseph, [name] in Treatment: 101 Vital Signs Height(in): 63 Pulse(bpm): 70 Weight(lbs): 176 Blood Pressure(mmHg): 148/71 Body Mass Index(BMI): 31.2 Temperature(F): 97.5 Respiratory Rate(breaths/min): 18 [1:Photos:] [N/A:N/A] Right Breast (mastectomy site) N/A  N/A Wound Location: Radiation Burn N/A N/A Wounding Event: Soft Tissue Radionecrosis N/A N/A Primary Etiology: Anemia, Lymphedema, Deep Vein N/A N/A Comorbid History: Thrombosis, Hypertension, Peripheral Venous Disease, Osteoarthritis, Received Chemotherapy, Received Radiation, Confinement Anxiety 07/26/2020 N/A N/A Date Acquired: 101 N/A N/A Weeks of Treatment: Open N/A N/A Wound Status: No N/A N/A Wound Recurrence: 1.2x2.8x0.3 N/A N/A Measurements L x W x D (cm) 2.639 N/A N/A A (cm) : rea 0.792 N/A N/A Volume (cm) : -72.30% N/A N/A % Reduction in A rea: -417.60% N/A N/A % Reduction in Volume: 12 Starting Position 1 (o'clock): 10 Ending Position 1 (o'clock): 0.5 Maximum Distance 1 (cm): Yes N/A N/A Undermining: Peer,  Letta Median ([id number]) [id number] Page 3 of 7 Full Thickness Without Exposed N/A N/A Classification: Support Structures Medium N/A N/A Exudate A mount: Serosanguineous N/A N/A Exudate Type: red, brown N/A N/A Exudate Color: Distinct, outline attached N/A N/A Wound Margin: Large (67-100%) N/A N/A Granulation A mount: Red, Friable N/A N/A Granulation Quality: Small (1-33%) N/A N/A Necrotic A mount: Fat Layer (Subcutaneous Tissue): Yes N/A N/A Exposed Structures: Fascia: No Tendon: No Muscle: No Joint: No Bone: No None N/A N/A Epithelialization: Debridement - Excisional N/A N/A Debridement: Pre-procedure Verification/Time Out 08:58 N/A N/A Taken: Lidocaine 5% topical ointment N/A N/A Pain Control: Subcutaneous, Slough N/A N/A Tissue Debrided: Skin/Subcutaneous Tissue N/A N/A Level: 3.36 N/A N/A Debridement A (sq cm): rea Curette N/A N/A Instrument: Minimum N/A N/A Bleeding: Pressure N/A N/A Hemostasis A chieved: 0 N/A N/A Procedural Pain: 0 N/A N/A Post Procedural Pain: Procedure was tolerated well N/A N/A Debridement Treatment Response: 1.2x2.8x0.3 N/A N/A Post Debridement Measurements L  x W x D (cm) 0.792 N/A N/A Post Debridement Volume: (cm) Excoriation: No N/A N/A Periwound Skin Texture: Induration: No Callus: No Crepitus: No Rash: No Scarring: No Maceration: No N/A N/A Periwound Skin Moisture: Dry/Scaly: No Atrophie Blanche: No N/A N/A Periwound Skin Color: Cyanosis: No Ecchymosis: No Erythema: No Hemosiderin Staining: No Mottled: No Pallor: No Rubor: No No Abnormality N/A N/A Temperature: Debridement N/A N/A Procedures Performed: Treatment Notes Electronic Signature(s) Signed: 07/30/2022 4:28:13 PM By: Linton Ham MD Signed: 08/08/2022 1:21:31 PM By: Lorrin Jackson Entered By: Linton Ham on [name] 09:08:03 -------------------------------------------------------------------------------- Multi-Disciplinary Care Plan Details Patient Name: Date of Service: [name], FA [name] 8:45 A M Medical Record Number: [id number] Patient Account Number: [id number] Date of Birth/Sex: Treating RN: 1951-01-31 (71 y.o. [name] Primary Care Sanai Frick: [name] Other Clinician: Referring Kentley Blyden: Treating Mujtaba Bollig/Extender: Baruch Gouty in Treatment: [place] reviewed with physician [address] SHENAYA, LEBO ([id number]) [id number] Page 4 of 7 Wound/Skin Impairment Nursing Diagnoses: Impaired tissue integrity Knowledge deficit related to ulceration/compromised skin integrity Goals: Patient/caregiver will verbalize understanding of skin care regimen Date Initiated: 08/22/2020 Target Resolution Date: 08/06/2022 Goal Status: Active Ulcer/skin breakdown will have a volume reduction of 30% by week 4 Date Initiated: 08/22/2020 Date Inactivated: 10/03/2020 Target Resolution Date: 09/19/2020 Goal Status: Met Ulcer/skin breakdown will have a volume reduction of 50% by week 8 Date Initiated: 06/05/2021 Date Inactivated: 07/17/2021 Target Resolution Date:  07/03/2021 Goal Status: Unmet Unmet Reason: infection Interventions: Assess patient/caregiver ability to obtain necessary supplies Assess patient/caregiver ability to perform ulcer/skin care regimen upon admission and as needed Assess ulceration(s) every visit Treatment Activities: Skin care regimen initiated : 08/22/2020 Topical wound management initiated : 08/22/2020 Notes: 06/05/21: Wound care regimen ongoing. 06/11/22: Wound care regimen continues, applying skin sub ([place]) Electronic Signature(s) Signed: 07/30/2022 5:13:40 PM By: Sharyn Creamer RN, BSN Entered By: Sharyn Creamer on [name] 09:17:00 -------------------------------------------------------------------------------- Pain Assessment Details Patient Name: Date of Service: [name], FA [name] 8:45 A M Medical Record Number: [id number] Patient Account Number: [id number] Date of Birth/Sex: Treating RN: 1951-02-08 (71 y.o. [name] Primary Care Stephany Poorman: [name] Other Clinician: Referring Bridget Westbrooks: Treating Andrey Hoobler/Extender: Adolm Joseph, [name] in Treatment: 101 Active Problems Location of Pain Severity and Description of Pain Patient Has Paino No Site Locations Pain Management and Medication Current Pain Management: KELCEE, BJORN ([id number]) [id number] Page 5 of 7 Electronic Signature(s) Signed: 07/30/2022 4:47:05 PM By: Erenest Blank Signed: 08/08/2022 1:21:31 PM By: Lorrin Jackson Entered By: Erenest Blank on  [name] 08:39:02 -------------------------------------------------------------------------------- Patient/Caregiver Education Details Patient Name: Date of Service: [name], [place] [name] 10/18/2023andnbsp8:45 A M Medical Record Number: [id number] Patient Account Number: [id number] Date of Birth/Gender: Treating RN: 1951-02-09 (71 y.o. [name] Primary Care Physician: [name] Other Clinician: Referring  Physician: Treating Physician/Extender: Baruch Gouty in Treatment: 101 Education Assessment Education Provided To: Patient Education Topics Provided Wound/Skin Impairment: Methods: Explain/Verbal Responses: State content correctly [employer]) Signed: 07/30/2022 5:13:40 PM By: Sharyn Creamer RN, BSN Entered By: Sharyn Creamer on [name] 09:27:12 -------------------------------------------------------------------------------- Wound Assessment Details Patient Name: Date of Service: [name], [place] [name] 8:45 A M Medical Record Number: [id number] Patient Account Number: [id number] Date of Birth/Sex: Treating RN: 1950/11/17 (71 y.o. [name] Primary Care Latrell Potempa: [name] Other Clinician: Referring Donyelle Enyeart: Treating Nicoles Sedlacek/Extender: Adolm Joseph, [name] in Treatment: 101 Wound Status Wound Number: 1 Primary Soft Tissue Radionecrosis Etiology: Wound Location: Right Breast (mastectomy site) Wound Open Wounding Event: Radiation Burn Status: Date Acquired: 07/26/2020 Comorbid Anemia, Lymphedema, Deep Vein Thrombosis, Hypertension, Weeks Of Treatment: 101 History: Peripheral Venous Disease, Osteoarthritis, Received Clustered Wound: No Chemotherapy, Received Radiation, Confinement Anxiety Photos DWAN, FENNEL ([id number]) [id number] Page 6 of 7 Wound Measurements Length: (cm) 1.2 Width: (cm) 2.8 Depth: (cm) 0.3 Area: (cm) 2.639 Volume: (cm) 0.792 % Reduction in Area: -72.3% % Reduction in Volume: -417.6% Epithelialization: None Tunneling: No Undermining: Yes Starting Position (o'clock): 12 Ending Position (o'clock): 10 Maximum Distance: (cm) 0.5 Wound Description Classification: Full Thickness Without Exposed Suppor Wound Margin: Distinct, outline attached Exudate Amount: Medium Exudate Type: Serosanguineous Exudate Color: red, brown t Structures Foul Odor  After Cleansing: No Slough/Fibrino Yes Wound Bed Granulation Amount: Large (67-100%) Exposed Structure Granulation Quality: Red, Friable Fascia Exposed: No Necrotic Amount: Small (1-33%) Fat Layer (Subcutaneous Tissue) Exposed: Yes Necrotic Quality: Adherent Slough Tendon Exposed: No Muscle Exposed: No Joint Exposed: No Bone Exposed: No Periwound Skin Texture Texture Color No Abnormalities Noted: Yes No Abnormalities Noted: No Atrophie Blanche: No Moisture Cyanosis: No No Abnormalities Noted: Yes Ecchymosis: No Erythema: No Hemosiderin Staining: No Mottled: No Pallor: No Rubor: No Temperature / Pain Temperature: No Abnormality Electronic Signature(s) Signed: 07/30/2022 5:13:40 PM By: Sharyn Creamer RN, BSN Signed: 08/08/2022 1:21:31 PM By: Lorrin Jackson Entered By: Sharyn Creamer on [name] 09:01:37 -------------------------------------------------------------------------------- Vitals Details Patient Name: Date of Service: [name], FA [name] 8:45 A M Medical Record Number: [id number] Patient Account Number: [id number] Date of Birth/Sex: Treating RN: 1951/06/17 (71 y.o. [name] Primary Care Hanif Radin: [name] Other Clinician: Referring Jemell Town: Treating Chane Magner/Extender: Ivana, Nicastro, Letta Median ([id number]) [id number].pdf Page 7 of 7 Weeks in Treatment: 101 Vital Signs Time Taken: 08:38 Temperature (F): 97.5 Height (in): 63 Pulse (bpm): 70 Weight (lbs): 176 Respiratory Rate (breaths/min): 18 Body Mass Index (BMI): 31.2 Blood Pressure (mmHg): 148/71 Reference Range: 80 - 120 mg / dl Electronic Signature(s) Signed: 07/30/2022 4:47:05 PM By: Erenest Blank Entered By: Erenest Blank on [name] 08:38:55

## 2022-08-13 ENCOUNTER — Encounter (HOSPITAL_BASED_OUTPATIENT_CLINIC_OR_DEPARTMENT_OTHER): Payer: Medicare Other | Attending: Physician Assistant | Admitting: Physician Assistant

## 2022-08-13 DIAGNOSIS — I700 Atherosclerosis of aorta: Secondary | ICD-10-CM | POA: Diagnosis not present

## 2022-08-13 DIAGNOSIS — I10 Essential (primary) hypertension: Secondary | ICD-10-CM | POA: Diagnosis not present

## 2022-08-13 DIAGNOSIS — L598 Other specified disorders of the skin and subcutaneous tissue related to radiation: Secondary | ICD-10-CM | POA: Insufficient documentation

## 2022-08-13 DIAGNOSIS — L589 Radiodermatitis, unspecified: Secondary | ICD-10-CM | POA: Diagnosis not present

## 2022-08-13 DIAGNOSIS — Z5111 Encounter for antineoplastic chemotherapy: Secondary | ICD-10-CM | POA: Insufficient documentation

## 2022-08-13 DIAGNOSIS — L98492 Non-pressure chronic ulcer of skin of other sites with fat layer exposed: Secondary | ICD-10-CM | POA: Insufficient documentation

## 2022-08-13 DIAGNOSIS — Z9011 Acquired absence of right breast and nipple: Secondary | ICD-10-CM | POA: Insufficient documentation

## 2022-08-13 DIAGNOSIS — S21001A Unspecified open wound of right breast, initial encounter: Secondary | ICD-10-CM | POA: Diagnosis not present

## 2022-08-13 DIAGNOSIS — Z86718 Personal history of other venous thrombosis and embolism: Secondary | ICD-10-CM | POA: Diagnosis not present

## 2022-08-13 DIAGNOSIS — Y842 Radiological procedure and radiotherapy as the cause of abnormal reaction of the patient, or of later complication, without mention of misadventure at the time of the procedure: Secondary | ICD-10-CM | POA: Insufficient documentation

## 2022-08-13 NOTE — Progress Notes (Addendum)
SKII, CLELAND ([id number]) [id number] Page 1 of 10 Visit Report for 08/13/2022 Chief Complaint Document Details Patient Name: Date of Service: [name] [name], [name] 08/13/2022 9:30 A M Medical Record Number: [id number] Patient Account Number: [id number] Date of Birth/Sex: Treating RN: 04-10-1951 (71 y.o. 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 [name] [name], [name] Primary Care Provider: [name] Other Clinician: Referring Provider: Treating Provider/Extender: [name], [name] [name] in Treatment: [phone] Cellular or Tissue Based Product Type Wound #1 Right Breast (mastectomy site) Applied to: Performed By: Physician Worthy Keeler, PA Cellular or Tissue Based Product Type: Epicord Level of Consciousness (Pre-procedure): Awake and Alert Pre-procedure Verification/Time Out Yes - 10:22 Taken: Location: trunk / arms / legs Wound Size (sq cm): 5.04 Product Size (sq cm): 6 Waste Size (sq cm): 0 Amount of Product Applied (sq cm): 6 Lot #: [id number] Expiration Date: 02/11/2027 Fenestrated: No Reconstituted: Yes Solution Type: WOUND CLEANSER Solution Amount: 76m Secured: Yes Procedural Pain: 0 Post Procedural Pain: 0 Response to Treatment: Procedure was tolerated  well Level of Consciousness (Post- Awake and Alert procedure): Post Procedure Diagnosis Same as PCarlus [name]([id number] Page 2 of 10 Electronic Signature(s) Signed: 08/13/2022 6:14:51 PM By: SWorthy KeelerPA-C Signed: 08/19/2022 3:49:22 PM By: BRhae HammockRN Entered By: BRhae Hammockon 08/13/2022 10:27:33 -------------------------------------------------------------------------------- HPI Details Patient Name: Date of Service: [name] [name], [name] 08/13/2022 9:30 A M Medical Record Number: [id number] Account Number: [id number] of Birth/Sex: Treating RN: 212-Jun-1952((71y.o. F) Primary Care Provider: HMaryella ShiversOther Clinician: Referring Provider: Treating Provider/Extender: SZenon Mayo FCathie Beamsin Treatment: 103 History of Present Illness HPI Description: 08/22/2020 upon evaluation today patient actually appears to be doing somewhat poorly in regard to her mastectomy site on the right chest wall. She had a mastectomy initially in 1998. She had chemotherapy only no radiation following. In 2002 she subsequently did undergo radiation for the first time and then subsequently in 2012 had repeat radiation after having had a finding that was consistent with a relapse of the cancer. 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 She also has radiation damage to the skin over the right chest wall where she had the mastectomy. She [facility] PBurney Gauze Subsequently I want to make sure before we delve into this more deeply that the patient does not have any issues with a return of cancer that needs to be managed by them. 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 Nonetheless she does not remember exactly when her last PET scan was but it was not too recently she tells me. 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 The patient does have hypertension as well. She sees her oncologist next  on December 6. 09/12/2020 on evaluation today patient actually appears to be doing excellent in regard to her wound under the right breast location. Currently this is measuring smaller in general seems to be doing very well. She tells me that the collagen is doing a good job and that the dressing that she has been putting on is not causing any troubles as far pulling on her skin or scar tissue is concerned all of which is excellent news. 10/03/2020 upon evaluation today patient appears to be doing well with regard to her surgical site from her mastectomy on the right breast region. She tells me that she has had very little bleeding nothing seems to be sticking badly and in general she has been extremely pleased with where things stand today. No fevers, chills, nausea, vomiting, or diarrhea. 10/24/2020 upon evaluation today patient actually appears to be doing excellent in regard to her wound. There is just a very small area still open and to be honest there was minimal slough noted on the surface of the wound nothing that requires sharp debridement. In general I feel like she is actually doing quite well and overall I am pleased. I do think we may switch to silver alginate dressing and also contemplating using a AandE ointment in order to help keep everything moist and the scar tissue region while the alginate keeps it dry enough to heal 11/14/2020 upon evaluation today patient appears to be doing well at this point in regard to her wound. I do feel like that she is making good progress again this is a very difficult region over the surgical site of the right chest wall at the site of mastectomy. Nonetheless I think that we are just a very  small area still open I think alginate is doing a good job helping to dry this up and I would recommend this such that we continue with this. 01/02/2021 on evaluation today patient's wound actually appears to be doing decently well. There does not appear to be any signs of active infection which is great news. She does have some slight slough buildup with biofilm on the surface of the wound mainly more biofilm. Nonetheless I was able to gently remove this with saline and gauze as well as a sterile Q-tip. She tolerated that today without complication. 01/30/2021 upon evaluation today patient appears to be doing well with regard to her wound although she still has quite a bit of trouble getting this to close I think the scar tissue and radiation damage is quite significant here unfortunately. There does not appear to be any signs of active infection which is great news. No fevers, chills, nausea, vomiting, or diarrhea. 02/27/2021 upon evaluation today patient appears to be doing excellent in regard to her wound. She has been tolerating the dressing changes without complication and in general I am extremely pleased with where things stand today. There does not appear to be any signs of infection which is great news. No fevers, chills, nausea, vomiting, or diarrhea. With that being said I do think that she still developing some slough buildup. I did discuss with her today the possibility of proceeding with HBO therapy. We have had this discussion before but she really is not quite committed to wanting to do that much and spend that much time in the chamber. She wants to still think about it. 03/27/2021 upon evaluation today patient appears to be doing about the same in regard to her wound. She still developing a lot of  slough buildup on the surface of the wound. I did try to clean that away to some degree today. She tolerated that without any pain or complication. Subsequently I am thinking we may want to  switch over to [facility] see if this may do better for her. Patient is in agreement with giving that a trial. 05/01/2021 upon evaluation today patient appears to be doing about the same in regard to her wounds. She has been tolerating the dressing changes without complication. Fortunately there does not appear to be any signs of active infection at this time which is great news. No fevers, chills, nausea, vomiting, or diarrhea.. 06/05/2021 upon evaluation today patient appears to be doing pretty well currently in regard to her wound at the mastectomy site right chest wall. Overall since we switch back to the alginate she tells me things have significantly improved she is much happier with where things stand currently. Fortunately there does not appear to be any signs of active infection which is great news. No fevers, chills, nausea, vomiting, or diarrhea. 07/10/2021 upon evaluation today patient unfortunately does not appear to be doing nearly as well as what she previously was. There does not appear to be any evidence of new epithelial growth she has a lot of necrotic tissue the base of the wound this is much more significant than what we previously noted. She also has been having increased pain and increased drainage. In general I am very concerned about this especially in light of the fact that she does have a history of breast cancer I want to ensure that were not looking at any cancerous type lesion at this point. Otherwise also think she does need some debridement we did do MolecuLight scanning today. 07/17/2021 upon evaluation today patient appears to be doing well with regard to her wound compared to last week although she still having some erythema I am concerned in this regard. I do think that she fortunately had a negative biopsy which is great news unfortunately she did have Pseudomonas noted as a [place], Letta Median ([id number]) [id number] Page 3 of 10 bacteria present in  the culture which I think is good and need to be addressed with Levaquin. I Minna send that into the pharmacy today. We did repeat the MolecuLight screening. 07/31/2021 upon evaluation today patient's wound is actually showing signs of improvement which is great news. Fortunately there does not appear to be any signs of infection currently locally nor systemically and I am very pleased in that regard. With that being said the patient is having some issues here with continued necrotic tissue I think packing with the Dakin's moistened gauze dressing is still in the right leg ago. 08/14/2021 upon evaluation today patient appears to be doing well with regard to her wound all things considered I do not see any signs of significant infection which is great news. Fortunately there does not appear to be any evidence of infection which is also great news. In general I think that the patient is tolerating the dressing changes well. We been using a Dakin's moistened gauze. 08/28/2021 upon evaluation today patient appears to be doing well with regard to her wound. Worsening signs of definite improvement which is great news and overall very pleased with where we stand today. There is no signs of inflamed tissue or infection which is great as well and I think the Dakin's is doing a great job here. 09/11/2021 upon evaluation today patient appears to be doing well with regard to her wound  all things considered. I am can perform some debridement today to clear away some of the necrotic debris with that being said overall I think that she is making decently good progress here which is great news. There does not appear to be any signs of active infection also good news. That in fact is probably the best news in her mind. 09/25/2021 upon evaluation today patient appears to be doing decently well in regard to her wound. Overall I do not see any signs of infection and I think she is doing quite well. I do believe that we  are making headway here although is very slow due to the scarring and the depth of the wound this is a significantly deep wound. 10/16/2021 upon evaluation today patient appears to be doing about the same in regard to the wound on her right chest wall. Fortunately there does not appear to be any signs of active infection locally nor systemically which is great news. With that being said this still was not cleaning up quite as quickly as I would like to see. Fortunately I think that we can definitely give the Santyl another try we tried this in the past and unfortunately did not have a good result but again I think she was infected at that time as well which was part of the issue. Nonetheless I think currently that we will be able to [name] ahead and see what we can do about getting this little cleaner with the Santyl. 11/06/2021 upon evaluation today patient appears to be doing well with regard to her wound. This actually appears to be a little bit better with the Santyl I am happy in that regard. Fortunately I do not see any signs of active infection at this time. No fevers, chills, nausea, vomiting, or diarrhea. 12/04/2021 upon evaluation patient appears to be doing well with the Santyl at this point. I am very pleased with where we stand and I think that she is making good progress here. Fortunately I do not see any evidence of active infection locally or systemically at this time which is great news. No fevers, chills, nausea, vomiting, or diarrhea. 01/01/2022 upon evaluation today patient actually is making excellent progress with the Santyl. I am extremely pleased with where we stand today. I do not see any signs of infection. 01-29-2022 upon evaluation today patient appears to be doing well with regard to her wound. This is showing signs of some improvement. It is a little less deep than what it was. Size wise is also slightly smaller but again there still is a significant wound in this area. Fortunately  I do not see any evidence of infection and she is doing quite well. 02-26-2022 upon evaluation today patient appears to be doing well with regard to her wound. In fact this is actually the best that I have seen in a very long time. I do not see any evidence of active infection at this time locally or systemically which is great news and overall I think we are on the right track. Nonetheless I do believe that the patient is continuing to show evidence of improvement with the Santyl week by week. 03-26-2022 upon evaluation today patient appears to be doing well with regard to the wound on her right mastectomy site. She is actually showing signs of excellent improvement in fact there was some bleeding just with a little bit of light cleaning over the area. Fortunately I do not see any signs of active infection locally or  systemically at this time which is great news. 04-30-2022 upon evaluation today patient appears to be doing well currently in regard to her wound. She is actually showing some signs of improvement here which is great news. Still this is very very slow. Subsequently I do believe that she may benefit from the use of [place] topical antibiotics for short amount of time before applying a skin substitute I think EpiCord could be doing very well for her as far as that is concerned. I discussed that with her today. Fortunately I do not see any evidence of active infection locally or systemically at this time which is great news. No fevers, chills, nausea, vomiting, or diarrhea. 05-28-2022 upon evaluation today patient's wound is actually showing signs of significant improvement. Fortunately I do not see any evidence of infection at this time which is great news. No fevers, chills, nausea, vomiting, or diarrhea. With that being said I do believe that the patient is tolerating the dressing changes without complication. We been using the [facility] which I think is helpful. We do have approval for the  [place]. 06-11-2022 upon evaluation today patient appears to be doing well with regard to her wound she is actually here today for the first application of [place] which I think is good to be beneficial for her. I do think that we will probably be able to keep this in place without can be the one thing of question to be honest. 06-18-2022 upon evaluation today patient actually appears to be doing excellent today. She has 1 treatment of the EpiCord underway and the wound surface already looks much better. This will be EpiCord #2 today. Fortunately I think we are on the right track here. 06-25-2022 upon evaluation today patient appears to be making good progress and overall the patient seems to have tolerated EpiCord without any complication. I do feel like that the wound is improving quite significantly. This is EpiCord #3 today. 07-02-2021 upon evaluation today patient's wound is actually showing signs of improvement. I am actually very pleased with where we stand we have been seeing some improvements in size overall and I think that we are still in the right track although there is a little bit of need for sharp debridement today to clearway some of the necrotic debris and remaining EpiCord which is actually still somewhat adhered to the wound bed. Overall I am extremely pleased though with where we stand. This is EpiCord #4 today. Upon evaluation today patient appears to be doing well with regard to her wound the EpiCord is definitely helping and does seem to be improving the overall status of the wound the surface is dramatically improved compared to what it was at the start of this. The tissue is actually becoming more red than it is just a pale pink and to be honest some of the did not even appear to be pink in the start. Nonetheless I am very pleased with where we stand currently. No fevers, chills, nausea, vomiting, or diarrhea. 07-16-2022 upon evaluation today patient appears to be doing well  currently in regard to her wound she is doing well with the [place] this is application #6 today. 07-23-2022 upon evaluation today patient's wound actually showing signs of excellent improvement which is great news. Fortunately I think were making progress again this is a very scarred area which now has a pretty good vascular supply which is great news. Overall I think that she is making great progress. This is EpiCord #7 today 10/18; Epicort  No. 8. Very minimally smaller. Wounds on the right breast mastectomy site complicated by soft tissue radiation damage. Unfortunately she lives in [place], 5 days a week transport would be impossible for her to arrange 08-06-2022 upon evaluation today patient's wound is showing signs again of minimal improvement with regard to the wound bed again this is something that would probably respond well to hyperbaric oxygen therapy and I think should we can probably get this approved but she is just not able to make the transportation from [place] here daily for the 7-monthperiod of time. Nonetheless I do think that she seems to be doing much better with regard to the GJIANNA, [name]([id number] Page 4 of 10 surface of the wound and I am pleased that regard I am going to [name] ahead and continue with the EPort [place]this is #9 application today. 08-13-2022 upon evaluation today patient's wound again is showing signs of improved quality to the tissue in the base of the wound. Fortunately I do not see any evidence of infection which is great news and overall I am extremely pleased in that regard. In general I do believe that the patient is making progress here. No fevers, chills, nausea, vomiting, or diarrhea. She is here today for ELennar Corporationapplication ##85Electronic Signature(s) Signed: 08/13/2022 10:27:25 AM By: SWorthy KeelerPA-C Entered By: SWorthy Keeleron 08/13/2022  10:27:24 -------------------------------------------------------------------------------- Physical Exam Details Patient Name: Date of Service: [name] [name], [name] 08/13/2022 9:30 A M Medical Record Number: [id number] Account Number: [id number] of Birth/Sex: Treating RN: 205/18/1952((71y.o. F) Primary Care Provider: HMaryella ShiversOther Clinician: Referring Provider: Treating Provider/Extender: SZenon Mayo [name]: 146Constitutional [name] and well-hydrated in no acute distress. Respiratory normal breathing without difficulty. Psychiatric this patient is able to make decisions and demonstrates good insight into disease process. Alert and Oriented x 3. pleasant and cooperative. Notes Upon inspection patient's wound bed actually showed signs of good granulation and epithelization at this point. Fortunately I do not see any evidence of infection locally or systemically which is great news and overall I am extremely pleased with where we stand today. I did [name] ahead and apply the last application of EParistoday. The patient tolerated this without complication. I am hopeful that by next week will be in a good spot to [name] ahead and initiate treatment with the collagen I think probably using a silver collagen followed by the Sorbact, likely been doing with the ECentral Pacoletto hold in place is a good option here for ongoing and continued treatment. She is in agreement with that plan. Electronic Signature(s) Signed: 08/13/2022 10:28:24 AM By: SWorthy KeelerPA-C Entered By: SWorthy Keeleron 08/13/2022 10:28:24 -------------------------------------------------------------------------------- Physician Orders Details Patient Name: Date of Service: [name] [name], [name] 08/13/2022 9:30 A M Medical Record Number: [id number] Account Number: [id number] of Birth/Sex: Treating RN: 225-Nov-1952((71y.o. 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 -Jeri Cos PA Wednesday Return A Anesthetic (In clinic) Topical Lidocaine 5% applied to wound  bed Cellular or Tissue Based Products Cellular or Tissue Based Product Type: - Order Epicord for application on 7/98/92=119% covered daptic or Mepitel. (DO NOT REMOVE). - Cellular or Tissue Based Product applied to wound bed, secured with steri-strips, cover with A Epicord #1 06/11/22, #2 06/18/22, #3 06/25/22, #4 07/02/22, #5 07/09/22, #6 07/16/22, Epicord #7 07/23/22, Epicord #8 07/30/22, Epicord #9 08/06/2022. Epicord # 10 08/13/22 - no more after 41DE application, per PA we will see how she does after this with collagen for now. Bathing/ Shower/ Hygiene May shower with protection but do not get wound dressing(s) wet. Additional Orders / Instructions Follow Nutritious Diet Wound Treatment Wound #1 - Breast (mastectomy site) Wound Laterality: Right Cleanser: Soap and Water 1 x Per Day/30 Days Discharge Instructions: May shower and wash wound with dial antibacterial soap and water prior to dressing change. Peri-Wound Care: Skin Prep (Generic) 1 x Per Day/30 Days Discharge Instructions: Use skin prep as directed Prim Dressing: Epicord 1 x Per Day/30 Days ary Discharge Instructions: secure with adaptic and steri strips Secondary Dressing: ALLEVYN Gentle Border, 5x5 (in/in) 1 x Per Day/30 Days Discharge Instructions: Apply over  primary dressing as directed. Secondary Dressing: Woven Gauze Sponges 2x2 in (Generic) 1 x Per Day/30 Days Discharge Instructions: Apply over primary dressing, fold in corners to fill the space Electronic Signature(s) Signed: 08/13/2022 6:14:51 PM By: Worthy Keeler PA-C Signed: 08/19/2022 3:49:22 PM By: Rhae Hammock RN Entered By: Rhae Hammock on 08/13/2022 10:20:57 -------------------------------------------------------------------------------- Problem List Details Patient Name: Date of Service: [name] [name], [name] 08/13/2022 9:30 A M Medical Record Number: [id number] Patient Account Number: [id number] Date of Birth/Sex: Treating RN: 09-20-1951 (71 y.o. F) Primary Care Provider: [name] Other Clinician: Referring Provider: Treating Provider/Extender: [name], [name] [name] in Treatment: [phone] Active Problems ICD-10 Encounter Code Description Active Date MDM Diagnosis L98.492 Non-pressure chronic ulcer of skin of other sites with fat layer exposed 08/22/2020 No Yes L58.9 Radiodermatitis, unspecified 08/22/2020 No Yes L59.8 Other specified disorders of the skin and subcutaneous tissue related to 08/22/2020 No Yes radiation [name] [name], [name] ([id number]) [id number] Page 6 of 10 I10 Essential (primary) hypertension 08/22/2020 No Yes Inactive Problems Resolved Problems Electronic Signature(s) Signed: 08/13/2022 9:24:39 AM By: Worthy Keeler PA-C Entered By: Worthy Keeler on 08/13/2022 09:24:38 -------------------------------------------------------------------------------- Progress Note Details Patient Name: Date of Service: [name] [name], [name] 08/13/2022 9:30 A M Medical Record Number: [id number] Patient Account Number: [id number] Date of Birth/Sex: Treating RN: April 02, 1951 (71 y.o. 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 She had a mastectomy initially in 1998. She had chemotherapy only no radiation following. In 2002 she subsequently did undergo radiation for the first time and then subsequently in 2012 had repeat radiation after having had a finding that was consistent with a relapse of the cancer. 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 She also has radiation damage to the skin over the right chest wall where she had the mastectomy. She was referred to [place] by Dr. Matthew Saras in [facility] and her oncologist is Dr. Burney Gauze. Subsequently I want to make sure before we delve into this more  deeply that the patient does not have any issues with a return of cancer that needs to be managed by them. 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 Nonetheless she does not remember exactly when her last PET scan was but it was not too recently she tells me. 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 The patient does have hypertension as well. She sees her oncologist next on December 6. 09/12/2020 on evaluation today patient actually appears to be doing excellent in regard to her wound under the right breast location. Currently this is measuring smaller in general seems to be doing very well. She tells me that the collagen is doing a good job and that the dressing that she has been putting on is not causing any troubles as far pulling on her skin or scar tissue is concerned all of which is excellent  news. 10/03/2020 upon evaluation today patient appears to be doing well with regard to her surgical site from her mastectomy on the right breast region. She tells me that she has had very little bleeding nothing seems to be sticking badly and in general she has been extremely pleased with where things stand today. No fevers, chills, nausea, vomiting, or diarrhea. 10/24/2020 upon evaluation today patient actually appears to be doing excellent in regard to her wound. There is just a very small area still open and to be honest there was minimal slough noted on the surface of the wound nothing that requires sharp debridement. In general I feel like she is actually doing quite well and overall I am pleased. I do think we may switch to silver alginate dressing and also contemplating using a AandE ointment in order to help keep everything moist and the scar tissue region while the alginate keeps it dry enough to heal 11/14/2020 upon evaluation today patient appears to be doing well at this point in regard to her wound. I do feel like that she is making good progress again this is a very difficult region over the surgical site of the right chest wall at the site of mastectomy. Nonetheless I think that we are just a very small area still open I think alginate is doing a good job helping to dry this up and I would recommend this such that we continue with this. 01/02/2021 on evaluation today patient's wound actually appears to be doing decently well. There does not appear to be any signs of active infection which is great news. She does have some slight slough buildup with biofilm on the surface of the wound mainly more biofilm. Nonetheless I was able to gently remove this with saline and gauze as well as a sterile Q-tip. She tolerated that today without complication. 01/30/2021 upon evaluation today patient appears to be doing well with regard to her wound although she still has quite a bit of trouble getting this  to close I think the scar tissue and radiation damage is quite significant here unfortunately. There does not appear to be any signs of active infection which is great news. No fevers, chills, nausea, vomiting, or diarrhea. 02/27/2021 upon evaluation today patient appears to be doing excellent in regard to her wound. She has been tolerating the dressing changes without complication and in general I am extremely pleased with where things stand today. There does not appear to be  any signs of infection which is great news. No fevers, chills, nausea, vomiting, or diarrhea. With that being said I do think that she still developing some slough buildup. I did discuss with her today the possibility of proceeding with HBO therapy. We have had this discussion before but she really is not quite committed to wanting to do that much and spend that much time in the chamber. She wants to still think about it. 03/27/2021 upon evaluation today patient appears to be doing about the same in regard to her wound. She still developing a lot of slough buildup on the surface of the wound. I did try to clean that away to some degree today. She tolerated that without any pain or complication. Subsequently I am thinking we may want to switch over to [facility] see if this may do better for her. Patient is in agreement with giving that a trial. [name], [name] [name] ([id number]) [id number] Page 7 of 10 05/01/2021 upon evaluation today patient appears to be doing about the same in regard to her wounds. She has been tolerating the dressing changes without complication. Fortunately there does not appear to be any signs of active infection at this time which is great news. No fevers, chills, nausea, vomiting, or diarrhea.. 06/05/2021 upon evaluation today patient appears to be doing pretty well currently in regard to her wound at the mastectomy site right chest wall. Overall since we switch back to the alginate she tells  me things have significantly improved she is much happier with where things stand currently. Fortunately there does not appear to be any signs of active infection which is great news. No fevers, chills, nausea, vomiting, or diarrhea. 07/10/2021 upon evaluation today patient unfortunately does not appear to be doing nearly as well as what she previously was. There does not appear to be any evidence of new epithelial growth she has a lot of necrotic tissue the base of the wound this is much more significant than what we previously noted. She also has been having increased pain and increased drainage. In general I am very concerned about this especially in light of the fact that she does have a history of breast cancer I want to ensure that were not looking at any cancerous type lesion at this point. Otherwise also think she does need some debridement we did do MolecuLight scanning today. 07/17/2021 upon evaluation today patient appears to be doing well with regard to her wound compared to last week although she still having some erythema I am concerned in this regard. I do think that she fortunately had a negative biopsy which is great news unfortunately she did have Pseudomonas noted as a bacteria present in the culture which I think is good and need to be addressed with Levaquin. I Minna send that into the pharmacy today. We did repeat the MolecuLight screening. 07/31/2021 upon evaluation today patient's wound is actually showing signs of improvement which is great news. Fortunately there does not appear to be any signs of infection currently locally nor systemically and I am very pleased in that regard. With that being said the patient is having some issues here with continued necrotic tissue I think packing with the Dakin's moistened gauze dressing is still in the right leg ago. 08/14/2021 upon evaluation today patient appears to be doing well with regard to her wound all things considered I do not see  any signs of significant infection which is great news. Fortunately there does not appear to be any evidence of  infection which is also great news. In general I think that the patient is tolerating the dressing changes well. We been using a Dakin's moistened gauze. 08/28/2021 upon evaluation today patient appears to be doing well with regard to her wound. Worsening signs of definite improvement which is great news and overall very pleased with where we stand today. There is no signs of inflamed tissue or infection which is great as well and I think the Dakin's is doing a great job here. 09/11/2021 upon evaluation today patient appears to be doing well with regard to her wound all things considered. I am can perform some debridement today to clear away some of the necrotic debris with that being said overall I think that she is making decently good progress here which is great news. There does not appear to be any signs of active infection also good news. That in fact is probably the best news in her mind. 09/25/2021 upon evaluation today patient appears to be doing decently well in regard to her wound. Overall I do not see any signs of infection and I think she is doing quite well. I do believe that we are making headway here although is very slow due to the scarring and the depth of the wound this is a significantly deep wound. 10/16/2021 upon evaluation today patient appears to be doing about the same in regard to the wound on her right chest wall. Fortunately there does not appear to be any signs of active infection locally nor systemically which is great news. With that being said this still was not cleaning up quite as quickly as I would like to see. Fortunately I think that we can definitely give the Santyl another try we tried this in the past and unfortunately did not have a good result but again I think she was infected at that time as well which was part of the issue. Nonetheless I think  currently that we will be able to [name] ahead and see what we can do about getting this little cleaner with the Santyl. 11/06/2021 upon evaluation today patient appears to be doing well with regard to her wound. This actually appears to be a little bit better with the Santyl I am happy in that regard. Fortunately I do not see any signs of active infection at this time. No fevers, chills, nausea, vomiting, or diarrhea. 12/04/2021 upon evaluation patient appears to be doing well with the Santyl at this point. I am very pleased with where we stand and I think that she is making good progress here. Fortunately I do not see any evidence of active infection locally or systemically at this time which is great news. No fevers, chills, nausea, vomiting, or diarrhea. 01/01/2022 upon evaluation today patient actually is making excellent progress with the Santyl. I am extremely pleased with where we stand today. I do not see any signs of infection. 01-29-2022 upon evaluation today patient appears to be doing well with regard to her wound. This is showing signs of some improvement. It is a little less deep than what it was. Size wise is also slightly smaller but again there still is a significant wound in this area. Fortunately I do not see any evidence of infection and she is doing quite well. 02-26-2022 upon evaluation today patient appears to be doing well with regard to her wound. In fact this is actually the best that I have seen in a very long time. I do not see any evidence of active  infection at this time locally or systemically which is great news and overall I think we are on the right track. Nonetheless I do believe that the patient is continuing to show evidence of improvement with the Santyl week by week. 03-26-2022 upon evaluation today patient appears to be doing well with regard to the wound on her right mastectomy site. She is actually showing signs of excellent improvement in fact there was some bleeding  just with a little bit of light cleaning over the area. Fortunately I do not see any signs of active infection locally or systemically at this time which is great news. 04-30-2022 upon evaluation today patient appears to be doing well currently in regard to her wound. She is actually showing some signs of improvement here which is great news. Still this is very very slow. Subsequently I do believe that she may benefit from the use of [place] topical antibiotics for short amount of time before applying a skin substitute I think EpiCord could be doing very well for her as far as that is concerned. I discussed that with her today. Fortunately I do not see any evidence of active infection locally or systemically at this time which is great news. No fevers, chills, nausea, vomiting, or diarrhea. 05-28-2022 upon evaluation today patient's wound is actually showing signs of significant improvement. Fortunately I do not see any evidence of infection at this time which is great news. No fevers, chills, nausea, vomiting, or diarrhea. With that being said I do believe that the patient is tolerating the dressing changes without complication. We been using the [facility] which I think is helpful. We do have approval for the [place]. 06-11-2022 upon evaluation today patient appears to be doing well with regard to her wound she is actually here today for the first application of [place] which I think is good to be beneficial for her. I do think that we will probably be able to keep this in place without can be the one thing of question to be honest. 06-18-2022 upon evaluation today patient actually appears to be doing excellent today. She has 1 treatment of the EpiCord underway and the wound surface already looks much better. This will be EpiCord #2 today. Fortunately I think we are on the right track here. 06-25-2022 upon evaluation today patient appears to be making good progress and overall the patient seems to have  tolerated EpiCord without any complication. I do feel like that the wound is improving quite significantly. This is EpiCord #3 today. 07-02-2021 upon evaluation today patient's wound is actually showing signs of improvement. I am actually very pleased with where we stand we have been seeing some improvements in size overall and I think that we are still in the right track although there is a little bit of need for sharp debridement today to clearway some of the necrotic debris and remaining EpiCord which is actually still somewhat adhered to the wound bed. Overall I am extremely pleased though with where we stand. This is EpiCord #4 today. [name], [name] [name] ([id number]) [id number] Page 8 of 10 Upon evaluation today patient appears to be doing well with regard to her wound the EpiCord is definitely helping and does seem to be improving the overall status of the wound the surface is dramatically improved compared to what it was at the start of this. The tissue is actually becoming more red than it is just a pale pink and to be honest some of the did not even appear to be  pink in the start. Nonetheless I am very pleased with where we stand currently. No fevers, chills, nausea, vomiting, or diarrhea. 07-16-2022 upon evaluation today patient appears to be doing well currently in regard to her wound she is doing well with the [place] this is application #6 today. 07-23-2022 upon evaluation today patient's wound actually showing signs of excellent improvement which is great news. Fortunately I think were making progress again this is a very scarred area which now has a pretty good vascular supply which is great news. Overall I think that she is making great progress. This is EpiCord #7 today 10/18; Epicort No. 8. Very minimally smaller. Wounds on the right breast mastectomy site complicated by soft tissue radiation damage. Unfortunately she lives in [place], 5 days a week transport  would be impossible for her to arrange 08-06-2022 upon evaluation today patient's wound is showing signs again of minimal improvement with regard to the wound bed again this is something that would probably respond well to hyperbaric oxygen therapy and I think should we can probably get this approved but she is just not able to make the transportation from [place] here daily for the 21-monthperiod of time. Nonetheless I do think that she seems to be doing much better with regard to the surface of the wound and I am pleased that regard I am going to [name] ahead and continue with the [place] is #9 application today. 08-13-2022 upon evaluation today patient's wound again is showing signs of improved quality to the tissue in the base of the wound. Fortunately I do not see any evidence of infection which is great news and overall I am extremely pleased in that regard. In general I do believe that the patient is making progress here. No fevers, chills, nausea, vomiting, or diarrhea. She is here today for EpiCord application ##22Objective Constitutional [name] and well-hydrated in no acute distress. Vitals Time Taken: 9:45 AM, Height: 63 in, Weight: 176 lbs, BMI: 31.2, Temperature: 97.6 F, Pulse: 73 bpm, Respiratory Rate: 20 breaths/min, Blood Pressure: 159/92 mmHg. Respiratory normal breathing without difficulty. Psychiatric this patient is able to make decisions and demonstrates good insight into disease process. Alert and Oriented x 3. pleasant and cooperative. General Notes: Upon inspection patient's wound bed actually showed signs of good granulation and epithelization at this point. Fortunately I do not see any evidence of infection locally or systemically which is great news and overall I am extremely pleased with where we stand today. I did [name] ahead and apply the last application of ELaramietoday. The patient tolerated this without complication. I am hopeful that by next week will be  in a good spot to [name] ahead and initiate treatment with the collagen I think probably using a silver collagen followed by the Sorbact, likely been doing with the EKnightdaleto hold in place is a good option here for ongoing and continued treatment. She is in agreement with that plan. Integumentary (Hair, Skin) Wound #1 status is Open. Original cause of wound was Radiation Burn. The date acquired was: 07/26/2020. The wound has been in treatment 103 weeks. The wound is located on the Right Breast (mastectomy site). The wound measures 1.8cm length x 2.8cm width x 0.3cm depth; 3.958cm^2 area and 1.188cm^3 volume. There is Fat Layer (Subcutaneous Tissue) exposed. There is no tunneling or undermining noted. There is a medium amount of serosanguineous drainage noted. The wound margin is distinct with the outline attached to the wound base. There is small (1-33%) red,  friable granulation within the wound bed. There is a large (67-100%) amount of necrotic tissue within the wound bed including Adherent Slough. The periwound skin appearance had no abnormalities noted for texture. The periwound skin appearance had no abnormalities noted for moisture. The periwound skin appearance did not exhibit: Atrophie Blanche, Cyanosis, Ecchymosis, Hemosiderin Staining, Mottled, Pallor, Rubor, Erythema. Periwound temperature was noted as No Abnormality. 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 A skin graft procedure using a bioengineered skin substitute/cellular or tissue based product was performed by Worthy Keeler, PA. Epicord was applied and secured. 6 sq cm of product  was utilized and 0 sq cm was wasted. A Time Out was conducted at 10:22, prior to the start of the procedure. The procedure was tolerated well with a pain level of 0 throughout and a pain level of 0 following the procedure. Post procedure Diagnosis Wound #1: Same as Pre-Procedure [name], [name] ([id number]) [id number] Page 9 of 10 . Plan Follow-up Appointments: Return Appointment in 1 week. Jeri Cos, PA Wednesday Anesthetic: (In clinic) Topical Lidocaine 5% applied to wound bed Cellular or Tissue Based Products: Cellular or Tissue Based Product Type: - Order Epicord for application on 0/34/74=259% covered Cellular or Tissue Based Product applied to wound bed, secured with steri-strips, cover with Adaptic or Mepitel. (DO NOT REMOVE). - Epicord #1 06/11/22, #2 06/18/22, #3 06/25/22, #4 07/02/22, #5 07/09/22, #6 07/16/22, Epicord #7 07/23/22, Epicord #8 07/30/22, Epicord #9 08/06/2022. Epicord # 10 08/13/22 - no more after 56LO application, per PA we will see how she does after this with collagen for now. Bathing/ Shower/ Hygiene: May shower with protection but do not get wound dressing(s) wet. Additional Orders / Instructions: Follow Nutritious Diet WOUND #1: - Breast (mastectomy site) Wound Laterality: Right Cleanser: Soap and Water 1 x Per Day/30 Days Discharge Instructions: May shower and wash wound with dial antibacterial soap and water prior to dressing change. Peri-Wound Care: Skin Prep (Generic) 1 x Per Day/30 Days Discharge Instructions: Use skin prep as directed Prim Dressing: Epicord 1 x Per Day/30 Days ary Discharge Instructions: secure with adaptic and steri strips Secondary Dressing: ALLEVYN Gentle Border, 5x5 (in/in) 1 x Per Day/30 Days Discharge Instructions: Apply over primary dressing as directed. Secondary Dressing: Woven Gauze Sponges 2x2 in (Generic) 1 x Per Day/30 Days Discharge Instructions: Apply over primary dressing, fold in corners to fill  the space 1. I see I am going to recommend that we [name] ahead and initiate treatment with a based on what continuation of the last EpiCord today. Following this and starting next week I think that an ideal way to manage this would be to use a silver collagen based dressing. We can use Sorbact in behind this likely been doing with the EpiCord to hold in place which I think has done an awesome job for her. 2. I am also can recommend that she should continue to monitor for any signs of worsening or infection obviously if she has any increased discomfort she knows contact the office and let me know. We will see patient back for reevaluation in 1 week here in the clinic. If anything worsens or changes patient will contact our office for additional recommendations. Electronic Signature(s) Signed: 08/13/2022 10:29:08  AM By: Worthy Keeler PA-C Entered By: Worthy Keeler on 08/13/2022 10:29:08 -------------------------------------------------------------------------------- SuperBill Details Patient Name: Date of Service: [name] [name], [name] 08/13/2022 Medical Record Number: [id number] Patient Account Number: [id number] Date of Birth/Sex: Treating RN: [birth year] (71 y.o. F) Primary Care Provider: [name] Other Clinician: Referring Provider: Treating Provider/Extender: [name], [name]: 103 Diagnosis Coding ICD-10 Codes Code Description [phone] Non-pressure chronic ulcer of skin of other sites with fat layer exposed L58.9 Radiodermatitis, unspecified L59.8 Other specified disorders of the skin and subcutaneous tissue related to radiation I10 Essential (primary) hypertension Facility Procedures : Alinda Money, F The patient participates with Medicare or their insurance follows the Medicare Facility Guidelines: CPT4 Code Description Modifier Quantity [id number] Epicord 2cm x 3cm - per sqcm 6 AYE ([id number]) [id number] Page 10 of   10 : The patient participates with Medicare or their insurance follows the Medicare Facility Guidelines: [id number] - SKIN SUB GRAFT TRNK/ARM/LEG 1 ICD-10 Diagnosis Description L98.492 Non-pressure chronic ulcer of skin of other sites with fat layer  exposed Physician Procedures : CPT4 Code Description Modifier [id number] - WC PHYS SKIN SUB GRAFT TRNK/ARM/LEG ICD-10 Diagnosis Description L98.492 Non-pressure chronic ulcer of skin of other sites with fat layer exposed Quantity: 1 Electronic Signature(s) Signed: 08/13/2022 10:29:33 AM By: Worthy Keeler PA-C Entered By: Worthy Keeler on 08/13/2022 10:29:33

## 2022-08-19 NOTE — Progress Notes (Signed)
ERNESTYNE, CALDWELL ([id number]) [id number] Page 1 of 6 Visit Report for 08/13/2022 Arrival Information Details Patient Name: Date of Service: [name], [place] YE 08/13/2022 9:30 A M Medical Record Number: [id number] Patient Account Number: [id number] Date of Birth/Sex: Treating RN: [birth year] (71 y.o. F) Primary Care Richar Dunklee: [name] Other Clinician: Referring Joesphine Schemm: Treating Sydnei Ohaver/Extender: [name], [name] in Treatment: 47 Visit Information History Since Last Visit All ordered tests and consults were completed: No Patient Arrived: Ambulatory Added or deleted any medications: No Arrival Time: 09:41 Any new allergies or adverse reactions: No Accompanied By: self Had a fall or experienced change in No Transfer Assistance: None activities of daily living that may affect Patient Identification Verified: Yes risk of falls: Secondary Verification Process Completed: Yes Signs or symptoms of abuse/neglect since last visito No Patient Requires Transmission-Based Precautions: No Hospitalized since last visit: No Patient Has Alerts: No Implantable device outside of the clinic excluding No cellular tissue based products placed in the center since last visit: Pain Present Now: No Electronic Signature(s) Signed: 08/13/2022 9:56:05 AM By: Worthy Rancher Entered By: Worthy Rancher on 08/13/2022 09:41:26 -------------------------------------------------------------------------------- Encounter Discharge Information Details Patient Name: Date of Service: [name], [place] YE 08/13/2022 9:30 A M Medical Record Number: [id number] Patient Account Number: [id number] Date of Birth/Sex: Treating RN: 04-15-1951 (71 y.o. [name], [name] Primary Care Nicolle Heward: [name] Other Clinician: Referring Kazi Reppond: Treating Arden Axon/Extender: [name], [name] in Treatment: [phone] Encounter Discharge Information Items Post Procedure  Vitals Discharge Condition: Stable Temperature (F): 98.7 Ambulatory Status: Ambulatory Pulse (bpm): 74 Discharge Destination: Home Respiratory Rate (breaths/min): 17 Transportation: Private Auto Blood Pressure (mmHg): 120/80 Accompanied By: self Schedule Follow-up Appointment: Yes Clinical Summary of Care: Patient Declined Electronic Signature(s) Signed: 08/19/2022 3:49:22 PM By: Rhae Hammock RN Entered By: Rhae Hammock on 08/13/2022 10:46:38 [name] ([id number]) [id number] Page 2 of 6 -------------------------------------------------------------------------------- Lower Extremity Assessment Details Patient Name: Date of Service: [name], [name] 08/13/2022 9:30 A M Medical Record Number: [id number] Patient Account Number: [id number] Date of Birth/Sex: Treating RN: Feb 13, 1951 (71 y.o. F) Primary Care Landan Fedie: [name] Other Clinician: Referring Guy Toney: Treating Nakaya Mishkin/Extender: [name], [name]: 103 Electronic Signature(s) Signed: 08/14/2022 11:40:36 AM By: Erenest Blank Entered By: Erenest Blank on 08/13/2022 09:50:54 -------------------------------------------------------------------------------- Multi-Disciplinary Care Plan Details Patient Name: Date of Service: [name], [name] 08/13/2022 9:30 A M Medical Record Number: [id number] Patient Account Number: [id number] Date of Birth/Sex: Treating RN: [birth year] (71 y.o. [name], [place] Primary Care Azia Toutant: [name] Other Clinician: Referring Rosilyn Coachman: Treating Cedrica Brune/Extender: [name], [name] in Treatment: [place] reviewed with physician Active Inactive Wound/Skin Impairment Nursing Diagnoses: Impaired tissue integrity Knowledge deficit related to ulceration/compromised skin integrity Goals: Patient/caregiver will verbalize understanding of skin care regimen Date Initiated:  08/22/2020 Target Resolution Date: 10/10/2022 Goal Status: Active Ulcer/skin breakdown will have a volume reduction of 30% by week 4 Date Initiated: 08/22/2020 Date Inactivated: 10/03/2020 Target Resolution Date: 09/19/2020 Goal Status: Met Ulcer/skin breakdown will have a volume reduction of 50% by week 8 Date Initiated: 06/05/2021 Date Inactivated: 07/17/2021 Target Resolution Date: 07/03/2021 Goal Status: Unmet Unmet Reason: infection Interventions: Assess patient/caregiver ability to obtain necessary supplies Assess patient/caregiver ability to perform ulcer/skin care regimen upon admission and as needed Assess ulceration(s) every visit Treatment Activities: Skin care regimen initiated : 08/22/2020 Topical wound management initiated : 08/22/2020 Notes: 06/05/21: Wound care regimen ongoing. 06/11/22: Wound care regimen continues, applying skin sub (Epicord) Electronic  Signature(s) Signed: 08/19/2022 3:49:22 PM By: Rhae Hammock RN Entered By: Rhae Hammock on 08/13/2022 10:21:20 [name] ([id number]) [id number] Page 3 of 6 -------------------------------------------------------------------------------- Pain Assessment Details Patient Name: Date of Service: [name], [name] 08/13/2022 9:30 A M Medical Record Number: [id number] Patient Account Number: [id number] Date of Birth/Sex: Treating RN: 1951/08/14 (71 y.o. F) Primary Care Panfilo Ketchum: [name] Other Clinician: Referring Emunah Texidor: Treating Yadir Zentner/Extender: [name], [name]: [phone] Active Problems Location of Pain Severity and Description of Pain Patient Has Paino No Site Locations Pain Management and Medication Current Pain Management: Electronic Signature(s) Signed: 08/13/2022 9:56:05 AM By: Worthy Rancher Entered By: Worthy Rancher on 08/13/2022 09:42:12 -------------------------------------------------------------------------------- Patient/Caregiver  Education Details Patient Name: Date of Service: [name], [name] 11/1/2023andnbsp9:30 A M Medical Record Number: [id number] Patient Account Number: [id number] Date of Birth/Gender: Treating RN: [birth year] (71 y.o. [name], [name] Primary Care Physician: [name] Other Clinician: Referring Physician: Treating Physician/Extender: [name], [name] in Treatment: [phone] Education Assessment Education Provided To: Patient Education Topics Provided Wound/Skin Impairment: Methods: Explain/Verbal Responses: State content correctly [place], Letta Median ([id number]) [id number] Page 4 of 6 Electronic Signature(s) Signed: 08/19/2022 3:49:22 PM By: Rhae Hammock RN Entered By: Rhae Hammock on 08/13/2022 10:21:35 -------------------------------------------------------------------------------- Wound Assessment Details Patient Name: Date of Service: [name], [name] 08/13/2022 9:30 A M Medical Record Number: [id number] Patient Account Number: [id number] Date of Birth/Sex: Treating RN: [birth year] (71 y.o. F) Primary Care Roland Prine: [name] Other Clinician: Referring Keaja Reaume: Treating Samel Bruna/Extender: [name], [name]: 103 Wound Status Wound Number: 1 Primary Soft Tissue Radionecrosis Etiology: Wound Location: Right Breast (mastectomy site) Wound Open Wounding Event: Radiation Burn Status: Date Acquired: 07/26/2020 Comorbid Anemia, Lymphedema, Deep Vein Thrombosis, Hypertension, Weeks Of Treatment: 103 History: Peripheral Venous Disease, Osteoarthritis, Received Clustered Wound: No Chemotherapy, Received Radiation, Confinement Anxiety Photos Wound Measurements Length: (cm) 1.8 Width: (cm) 2.8 Depth: (cm) 0.3 Area: (cm) 3.958 Volume: (cm) 1.188 % Reduction in Area: -158.4% % Reduction in Volume: -676.5% Epithelialization: None Tunneling: No Undermining: No Wound  Description Classification: Full Thickness Without Exposed Suppor Wound Margin: Distinct, outline attached Exudate Amount: Medium Exudate Type: Serosanguineous Exudate Color: red, brown t Structures Foul Odor After Cleansing: No Slough/Fibrino Yes Wound Bed Granulation Amount: Small (1-33%) Exposed Structure Granulation Quality: Red, Friable Fascia Exposed: No Necrotic Amount: Large (67-100%) Fat Layer (Subcutaneous Tissue) Exposed: Yes Necrotic Quality: Adherent Slough Tendon Exposed: No Muscle Exposed: No Joint Exposed: No Bone Exposed: No Periwound Skin Texture Texture Color No Abnormalities Noted: Yes No Abnormalities Noted: No Atrophie BlancheChristie Beckers ORETA, [place] ([id number]) [id number] Page 5 of 6 Cyanosis: No No Abnormalities Noted: Yes Ecchymosis: No Erythema: No Hemosiderin Staining: No Mottled: No Pallor: No Rubor: No Temperature / Pain Temperature: No Abnormality Treatment Notes Wound #1 (Breast (mastectomy site)) Wound Laterality: Right Cleanser Soap and Water Discharge Instruction: May shower and wash wound with dial antibacterial soap and water prior to dressing change. Peri-Wound Care Skin Prep Discharge Instruction: Use skin prep as directed Topical Primary Dressing [place] Discharge Instruction: secure with adaptic and steri strips Secondary Dressing ALLEVYN Gentle Border, 5x5 (in/in) Discharge Instruction: Apply over primary dressing as directed. Woven Gauze Sponges 2x2 in Discharge Instruction: Apply over primary dressing, fold in corners to fill the space Secured With Compression Wrap Compression Stockings Add-Ons Electronic Signature(s) Signed: 08/14/2022 11:40:36 AM By: Erenest Blank Entered By: Erenest Blank on 08/13/2022 09:55:45 -------------------------------------------------------------------------------- Vitals Details Patient Name: Date of Service: [name], [name]  08/13/2022 9:30 A M Medical  Record Number: [id number] Patient Account Number: [id number] Date of Birth/Sex: Treating RN: Mar 04, 1951 (71 y.o. F) Primary Care Cynde Menard: [name] Other Clinician: Referring Brayant Dorr: Treating Miyeko Mahlum/Extender: [name], [name]: 103 Vital Signs Time Taken: 09:45 Temperature (F): 97.6 Height (in): 63 Pulse (bpm): 73 Weight (lbs): 176 Respiratory Rate (breaths/min): 20 Body Mass Index (BMI): 31.2 Blood Pressure (mmHg): 159/92 Reference Range: 80 - 120 mg / dl Electronic Signature(s) Signed: 08/13/2022 9:56:05 AM By: Worthy Rancher Entered By: Worthy Rancher on 08/13/2022 09:41:58 [name] ([id number]) [id number] Page 6 of 6

## 2022-08-20 ENCOUNTER — Encounter (HOSPITAL_BASED_OUTPATIENT_CLINIC_OR_DEPARTMENT_OTHER): Payer: Medicare Other | Admitting: Internal Medicine

## 2022-08-20 DIAGNOSIS — Z5111 Encounter for antineoplastic chemotherapy: Secondary | ICD-10-CM | POA: Diagnosis not present

## 2022-08-20 DIAGNOSIS — Z9011 Acquired absence of right breast and nipple: Secondary | ICD-10-CM | POA: Diagnosis not present

## 2022-08-20 DIAGNOSIS — L589 Radiodermatitis, unspecified: Secondary | ICD-10-CM | POA: Diagnosis not present

## 2022-08-20 DIAGNOSIS — L598 Other specified disorders of the skin and subcutaneous tissue related to radiation: Secondary | ICD-10-CM | POA: Diagnosis not present

## 2022-08-20 DIAGNOSIS — L98492 Non-pressure chronic ulcer of skin of other sites with fat layer exposed: Secondary | ICD-10-CM | POA: Diagnosis not present

## 2022-08-20 DIAGNOSIS — I10 Essential (primary) hypertension: Secondary | ICD-10-CM | POA: Diagnosis not present

## 2022-08-20 NOTE — Progress Notes (Signed)
[name], [name] ([id number]) [id number] Page 1 of 9 Visit Report for 08/20/2022 Arrival Information Details Patient Name: Date of Service: [name],  YE 08/20/2022 9:30 A M Medical Record Number: [id number] Patient Account Number: [id number] Date of Birth/Sex: Treating RN: 08/22/1951 (71 y.o. F) Primary Care Tina Temme: [name] Other Clinician: Referring Tajuana Kniskern: Treating Elfego Giammarino/Extender: Adolm Joseph, [name] in Treatment: 104 Visit Information History Since Last Visit Added or deleted any medications: No Patient Arrived: Ambulatory Any new allergies or adverse reactions: No Arrival Time: 09:16 Had a fall or experienced change in No Accompanied By: sister activities of daily living that may affect Transfer Assistance: None risk of falls: Patient Identification Verified: Yes Signs or symptoms of abuse/neglect since last visito No Secondary Verification Process Completed: Yes Hospitalized since last visit: No Patient Requires Transmission-Based Precautions: No Implantable device outside of the clinic excluding No Patient Has Alerts: No cellular tissue based products placed in the center since last visit: Has Dressing in Place as Prescribed: Yes Pain Present Now: No Electronic Signature(s) Signed: 08/20/2022 3:44:50 PM By: Erenest Blank Entered By: Erenest Blank on 08/20/2022 09:17:17 -------------------------------------------------------------------------------- Clinic Level of Care Assessment Details Patient Name: Date of Service: [name], [name] 08/20/2022 9:30 A M Medical Record Number: [id number] Patient Account Number: [id number] Date of Birth/Sex: Treating RN: [birth year] (71 y.o. [name], [name] Primary Care Lyndy Russman: [name] Other Clinician: Referring Henslee Lottman: Treating Maleigh Bagot/Extender: Adolm Joseph, [name] in Treatment: [place] Assessment Items TOOL 4 Quantity  Score X- 1 0 Use when only an EandM is performed on FOLLOW-UP visit ASSESSMENTS - Nursing Assessment / Reassessment X- 1 10 Reassessment of Co-morbidities (includes updates in patient status) X- 1 5 Reassessment of Adherence to Treatment Plan ASSESSMENTS - Wound and Skin A ssessment / Reassessment X - Simple Wound Assessment / Reassessment - one wound 1 5 _0  - 0 Complex Wound Assessment / Reassessment - multiple wounds _1  - 0 Dermatologic / Skin Assessment (not related to wound area) ASSESSMENTS - Focused Assessment _2  - 0 Circumferential Edema Measurements - multi extremities _3  - 0 Nutritional Assessment / Counseling / Intervention [name], [name] ([id number]) [id number] Page 2 of 9 _4  - 0 Lower Extremity Assessment (monofilament, tuning fork, pulses) _5  - 0 Peripheral Arterial Disease Assessment (using hand held doppler) ASSESSMENTS - Ostomy and/or Continence Assessment and Care _6  - 0 Incontinence Assessment and Management _7  - 0 Ostomy Care Assessment and Management (repouching, etc.) PROCESS - Coordination of Care X - Simple Patient / Family Education for ongoing care 1 15 _8  - 0 Complex (extensive) Patient / Family Education for ongoing care X- 1 10 Staff obtains Programmer, systems, Records, T Results / Process Orders est _9  - 0 Staff telephones HHA, Nursing Homes / Clarify orders / etc _10  - 0 Routine Transfer to another Facility (non-emergent condition) _11  - 0 Routine Hospital Admission (non-emergent condition) _12  - 0 New Admissions / Biomedical engineer / Ordering NPWT Apligraf, etc. , _13  - 0 Emergency Hospital Admission (emergent condition) X- 1 10 Simple Discharge Coordination _14  - 0 Complex (extensive) Discharge Coordination PROCESS - Special Needs _15  - 0 Pediatric / Minor Patient Management _16  - 0 Isolation Patient Management _17  - 0 Hearing / Language / Visual special needs _18  - 0 Assessment of Community assistance  (transportation, D/C planning, etc.) _19  - 0 Additional assistance / Altered mentation _20  - 0 Support Surface(s) Assessment (bed, cushion, seat, etc.) INTERVENTIONS - Wound Cleansing / Measurement X - Simple Wound Cleansing - one wound 1  5 _0  - 0 Complex Wound Cleansing - multiple wounds X- 1 5 Wound Imaging (photographs - any number of wounds) _1  - 0 Wound Tracing (instead of photographs) X- 1 5 Simple Wound Measurement - one wound _2  - 0 Complex Wound Measurement - multiple wounds INTERVENTIONS - Wound Dressings X - Small Wound Dressing one or multiple wounds 1 10 _3  - 0 Medium Wound Dressing one or multiple wounds _4  - 0 Large Wound Dressing one or multiple wounds <ERXVQMGQQPYPPJKD>_3<\/OIZTIWPYKDXIPJAS>_5  - 0 Application of Medications - topical <KNLZJQBHALPFXTKW>_4<\/OXBDZHGDJMEQASTM>_1  - 0 Application of Medications - injection INTERVENTIONS - Miscellaneous _7  - 0 External ear exam _8  - 0 Specimen Collection (cultures, biopsies, blood, body fluids, etc.) _9  - 0 Specimen(s) / Culture(s) sent or taken to Lab for analysis _10  - 0 Patient Transfer (multiple staff / Civil Service fast streamer / Similar devices) _11  - 0 Simple Staple / Suture removal (25 or less) _12  - 0 Complex Staple / Suture removal (26 or more) _13  - 0 Hypo / Hyperglycemic Management (close monitor of Blood Glucose) [name] ([id number]) [id number] Page 3 of 9 _14  - 0 Ankle / Brachial Index (ABI) - do not check if billed separately X- 1 5 Vital Signs Has the patient been seen at the hospital within the last three years: Yes Total Score: 85 Level Of Care: New/Established - Level 3 Electronic Signature(s) Signed: 08/20/2022 3:55:23 PM By: Rhae Hammock RN Entered By: Rhae Hammock on 08/20/2022 09:38:59 -------------------------------------------------------------------------------- Encounter Discharge Information Details Patient Name: Date of Service: [name], [name] 08/20/2022 9:30 A M Medical Record Number: [id number] Patient Account Number:  [id number] Date of Birth/Sex: Treating RN: 1951/08/26 (71 y.o. [name], [name] Primary Care Kongmeng Santoro: [name] Other Clinician: Referring Tressie Ragin: Treating Avrey Flanagin/Extender: Adolm Joseph, [name] in Treatment: 104 Encounter Discharge Information Items Discharge Condition: Stable Ambulatory Status: Ambulatory Discharge Destination: Home Transportation: Private Auto Accompanied By: self Schedule Follow-up Appointment: Yes Clinical Summary of Care: Patient Declined Electronic Signature(s) Signed: 08/20/2022 3:55:23 PM By: Rhae Hammock RN Entered By: Rhae Hammock on 08/20/2022 09:39:39 -------------------------------------------------------------------------------- Lower Extremity Assessment Details Patient Name: Date of Service: [name], [name] 08/20/2022 9:30 A M Medical Record Number: [id number] Patient Account Number: [id number] Date of Birth/Sex: Treating RN: Aug 09, 1951 (71 y.o. F) Primary Care Istvan Behar: [name] Other Clinician: Referring Valon Glasscock: Treating Lahoma Constantin/Extender: Adolm Joseph, [name] in Treatment: 104 Electronic Signature(s) Signed: 08/20/2022 3:44:50 PM By: Erenest Blank Entered By: Erenest Blank on 08/20/2022 09:20:16 -------------------------------------------------------------------------------- Multi Wound Chart Details Patient Name: Date of Service: [place] INS, [place] YE 08/20/2022 9:30 A M Medical Record Number: [id number] Patient Account Number: [id number] [name], [name] ([id number]) [id number] Page 4 of 9 Date of Birth/Sex: Treating RN: 1951/05/19 (71 y.o. F) Primary Care Joy Haegele: Other Clinician: [name] Referring Nayan Proch: Treating Kenton Fortin/Extender: Adolm Joseph, [name] in Treatment: 104 Vital Signs Height(in): 63 Pulse(bpm): 61 Weight(lbs): 176 Blood Pressure(mmHg): 150/76 Body Mass Index(BMI): 31.2 Temperature(F):  97.8 Respiratory Rate(breaths/min): 18 [1:Photos:] [N/A:N/A] Right Breast (mastectomy site) N/A N/A Wound Location: Radiation Burn N/A N/A Wounding Event: Soft Tissue Radionecrosis N/A N/A Primary Etiology: Anemia, Lymphedema, Deep Vein N/A N/A Comorbid History: Thrombosis, Hypertension, Peripheral Venous Disease, Osteoarthritis, Received Chemotherapy, Received Radiation, Confinement Anxiety 07/26/2020 N/A N/A Date Acquired: 104 N/A N/A Weeks of Treatment: Open N/A N/A Wound Status: No N/A N/A Wound Recurrence: 1.5x2.5x0.3 N/A N/A Measurements L x W x D (cm) 2.945 N/A N/A A (cm) : rea 0.884 N/A N/A Volume (cm) : -92.20% N/A N/A % Reduction in Area: -477.80% N/A N/A %  Reduction in Volume: Full Thickness Without Exposed N/A N/A Classification: Support Structures Medium N/A N/A Exudate Amount: Serosanguineous N/A N/A Exudate Type: red, brown N/A N/A Exudate Color: Distinct, outline attached N/A N/A Wound Margin: Large (67-100%) N/A N/A Granulation Amount: Red, Friable N/A N/A Granulation Quality: Small (1-33%) N/A N/A Necrotic Amount: Fat Layer (Subcutaneous Tissue): Yes N/A N/A Exposed Structures: Fascia: No Tendon: No Muscle: No Joint: No Bone: No None N/A N/A Epithelialization: Excoriation: No N/A N/A Periwound Skin Texture: Induration: No Callus: No Crepitus: No Rash: No Scarring: No Maceration: No N/A N/A Periwound Skin Moisture: Dry/Scaly: No Atrophie Blanche: No N/A N/A Periwound Skin Color: Cyanosis: No Ecchymosis: No Erythema: No Hemosiderin Staining: No Mottled: No Pallor: No Rubor: No No Abnormality N/A N/A Temperature: Treatment Notes Wound #1 (Breast (mastectomy site)) Wound Laterality: Right Cleanser Soap and Water Discharge Instruction: May shower and wash wound with dial antibacterial soap and water prior to dressing change. [name], [name] ([id number]) [id number] Page 5 of 9 Peri-Wound  Care Skin Prep Discharge Instruction: Use skin prep as directed Topical Primary Dressing Promogran Prisma Matrix, 4.34 (sq in) (silver collagen) Discharge Instruction: Moisten collagen with saline or hydrogel Secondary Dressing ALLEVYN Gentle Border, 5x5 (in/in) Discharge Instruction: Apply over primary dressing as directed. Woven Gauze Sponges 2x2 in Discharge Instruction: Apply over primary dressing, fold in corners to fill the space Secured With Compression Wrap Compression Stockings Add-Ons Electronic Signature(s) Signed: 08/20/2022 3:30:35 PM By: Linton Ham MD Entered By: Linton Ham on 08/20/2022 09:57:35 -------------------------------------------------------------------------------- Multi-Disciplinary Care Plan Details Patient Name: Date of Service: [name], [name] 08/20/2022 9:30 A M Medical Record Number: [id number] Patient Account Number: [id number] Date of Birth/Sex: Treating RN: 11/30/1950 (71 y.o. [name], [name] Primary Care Lailynn Southgate: [name] Other Clinician: Referring Safira Proffit: Treating Felishia Wartman/Extender: Baruch Gouty in Treatment: [place] reviewed with physician Active Inactive Wound/Skin Impairment Nursing Diagnoses: Impaired tissue integrity Knowledge deficit related to ulceration/compromised skin integrity Goals: Patient/caregiver will verbalize understanding of skin care regimen Date Initiated: 08/22/2020 Target Resolution Date: 10/10/2022 Goal Status: Active Ulcer/skin breakdown will have a volume reduction of 30% by week 4 Date Initiated: 08/22/2020 Date Inactivated: 10/03/2020 Target Resolution Date: 09/19/2020 Goal Status: Met Ulcer/skin breakdown will have a volume reduction of 50% by week 8 Date Initiated: 06/05/2021 Date Inactivated: 07/17/2021 Target Resolution Date: 07/03/2021 Goal Status: Unmet Unmet Reason: infection Interventions: Assess patient/caregiver ability to  obtain necessary supplies Assess patient/caregiver ability to perform ulcer/skin care regimen upon admission and as needed Assess ulceration(s) every visit Treatment Activities: Skin care regimen initiated : 08/22/2020 [name] ([id number]) [id number] Page 6 of 9 Topical wound management initiated : 08/22/2020 Notes: 06/05/21: Wound care regimen ongoing. 06/11/22: Wound care regimen continues, applying skin sub ([place]) Electronic Signature(s) Signed: 08/20/2022 3:55:23 PM By: Rhae Hammock RN Entered By: Rhae Hammock on 08/20/2022 09:38:00 -------------------------------------------------------------------------------- Pain Assessment Details Patient Name: Date of Service: [name], [name] 08/20/2022 9:30 A M Medical Record Number: [id number] Patient Account Number: [id number] Date of Birth/Sex: Treating RN: [birth year] (71 y.o. F) Primary Care Marsean Elkhatib: [name] Other Clinician: Referring Jeryn Bertoni: Treating Janith Nielson/Extender: Adolm Joseph, [name] in Treatment: 104 Active Problems Location of Pain Severity and Description of Pain Patient Has Paino No Site Locations Pain Management and Medication Current Pain Management: Electronic Signature(s) Signed: 08/20/2022 3:44:50 PM By: Erenest Blank Entered By: Erenest Blank on 08/20/2022 09:20:08 -------------------------------------------------------------------------------- Patient/Caregiver Education Details Patient Name: Date of Service: [name], [name] 11/8/2023andnbsp9:30 A M Medical Record Number: [id number] Patient  Account Number: [id number] Date of Birth/Gender: Treating RN: 01-Mar-1951 (71 y.o. Benjaman Lobe Primary Care Physician: [name] Other Clinician: Referring Physician: Treating Physician/Extender: Adolm Joseph, [name] in Treatment: [address] [place] ([id number]) [id number] Page 7 of 9 Education  Assessment Education Provided To: Patient Education Topics Provided Wound/Skin Impairment: Methods: Explain/Verbal Responses: State content correctly Electronic Signature(s) Signed: 08/20/2022 3:55:23 PM By: Rhae Hammock RN Entered By: Rhae Hammock on 08/20/2022 09:31:10 -------------------------------------------------------------------------------- Wound Assessment Details Patient Name: Date of Service: [name], [name] 08/20/2022 9:30 A M Medical Record Number: [id number] Patient Account Number: [id number] Date of Birth/Sex: Treating RN: 07-Nov-1950 (71 y.o. F) Primary Care Amirah Goerke: [name] Other Clinician: Referring Harjit Douds: Treating Kaylen Nghiem/Extender: Adolm Joseph, [name] in Treatment: 104 Wound Status Wound Number: 1 Primary Soft Tissue Radionecrosis Etiology: Wound Location: Right Breast (mastectomy site) Wound Open Wounding Event: Radiation Burn Status: Date Acquired: 07/26/2020 Comorbid Anemia, Lymphedema, Deep Vein Thrombosis, Hypertension, Weeks Of Treatment: 104 History: Peripheral Venous Disease, Osteoarthritis, Received Clustered Wound: No Chemotherapy, Received Radiation, Confinement Anxiety Photos Wound Measurements Length: (cm) 1.5 Width: (cm) 2.5 Depth: (cm) 0.3 Area: (cm) 2.945 Volume: (cm) 0.884 % Reduction in Area: -92.2% % Reduction in Volume: -477.8% Epithelialization: None Tunneling: No Undermining: No Wound Description Classification: Full Thickness Without Exposed Suppor Wound Margin: Distinct, outline attached Exudate Amount: Medium Exudate Type: Serosanguineous Exudate Color: red, brown t Structures Foul Odor After Cleansing: No Slough/Fibrino Yes Wound Bed Granulation Amount: Large (67-100%) Exposed Structure Granulation Quality: Red, Friable Fascia Exposed: No [name], [name] ([id number]) [id number] Page 8 of 9 Necrotic Amount: Small (1-33%) Fat Layer (Subcutaneous  Tissue) Exposed: Yes Necrotic Quality: Adherent Slough Tendon Exposed: No Muscle Exposed: No Joint Exposed: No Bone Exposed: No Periwound Skin Texture Texture Color No Abnormalities Noted: Yes No Abnormalities Noted: No Atrophie Blanche: No Moisture Cyanosis: No No Abnormalities Noted: Yes Ecchymosis: No Erythema: No Hemosiderin Staining: No Mottled: No Pallor: No Rubor: No Temperature / Pain Temperature: No Abnormality Treatment Notes Wound #1 (Breast (mastectomy site)) Wound Laterality: Right Cleanser Soap and Water Discharge Instruction: May shower and wash wound with dial antibacterial soap and water prior to dressing change. Peri-Wound Care Skin Prep Discharge Instruction: Use skin prep as directed Topical Primary Dressing Promogran Prisma Matrix, 4.34 (sq in) (silver collagen) Discharge Instruction: Moisten collagen with saline or hydrogel Secondary Dressing ALLEVYN Gentle Border, 5x5 (in/in) Discharge Instruction: Apply over primary dressing as directed. Woven Gauze Sponges 2x2 in Discharge Instruction: Apply over primary dressing, fold in corners to fill the space Secured With Compression Wrap Compression Stockings Add-Ons Electronic Signature(s) Signed: 08/20/2022 3:44:50 PM By: Erenest Blank Entered By: Erenest Blank on 08/20/2022 09:27:45 -------------------------------------------------------------------------------- Vitals Details Patient Name: Date of Service: [name], [name] 08/20/2022 9:30 A M Medical Record Number: [id number] Patient Account Number: [id number] Date of Birth/Sex: Treating RN: 14-Apr-1951 (72 y.o. F) Primary Care Daneka Lantigua: [name] Other Clinician: Referring Carmeron Heady: Treating Sarajean Dessert/Extender: Adolm Joseph, [name] in Treatment: [address] [name], [name] ([id number]) [id number] Page 9 of 9 Time Taken: 09:19 Temperature (F): 97.8 Height (in): 63 Pulse (bpm): 61 Weight  (lbs): 176 Respiratory Rate (breaths/min): 18 Body Mass Index (BMI): 31.2 Blood Pressure (mmHg): 150/76 Reference Range: 80 - 120 mg / dl Electronic Signature(s) Signed: 08/20/2022 3:44:50 PM By: Erenest Blank Entered By: Erenest Blank on 08/20/2022 09:19:44

## 2022-08-20 NOTE — Progress Notes (Signed)
[name] [name], [name] [name] ([id number]) [id number] Page 1 of 8 Visit Report for 08/20/2022 HPI Details Patient Name: Date of Service: [name], [place] YE 08/20/2022 9:30 A M Medical Record Number: [id number] Patient Account Number: [id number] Date of Birth/Sex: Treating RN: 1950/11/04 (71 y.o. F) Primary Care Provider: [name] Other Clinician: Referring Provider: Treating Provider/Extender: Adolm Joseph, [name] in Treatment: 104 History of Present Illness HPI Description: 08/22/2020 upon evaluation today patient actually appears to be doing somewhat poorly in regard to her mastectomy site on the right chest wall. She had a mastectomy initially in 1998. She had chemotherapy only no radiation following. In 2002 she subsequently did undergo radiation for the first time and then subsequently in 2012 had repeat radiation after having had a finding that was consistent with a relapse of the cancer. 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 She also has radiation damage to the skin over the right chest wall where she had the mastectomy. She was referred to [place] by Dr. Matthew Saras in [facility] and her oncologist is Dr. Burney Gauze. Subsequently I want to make sure before we delve into this more deeply that the patient does not have any issues with a return of cancer that needs to be managed by them. 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 Nonetheless she does not remember exactly when her last PET scan was but it was not too recently she tells me. 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 The patient does have hypertension as well. She sees her oncologist next on December 6. 09/12/2020 on evaluation today patient actually appears to be doing excellent in  regard to her wound under the right breast location. Currently this is measuring smaller in general seems to be doing very well. She tells me that the collagen is doing a good job and that the dressing that she has been putting on is not causing any troubles as far pulling on her skin or scar tissue is concerned all of which is excellent news. 10/03/2020 upon evaluation today patient appears to be doing well with regard to her surgical site from her mastectomy on the right breast region. She tells me that she has had very little bleeding nothing seems to be sticking badly and in general she has been extremely pleased with where things stand today. No fevers, chills, nausea, vomiting, or diarrhea. 10/24/2020 upon evaluation today patient actually appears to be doing excellent in regard to her wound. There is just a very small area still open and to be honest there was minimal slough noted on the surface of the wound nothing that requires sharp debridement. In general I feel like she is actually doing quite well and overall I am pleased. I do think we may switch to silver alginate dressing and also contemplating using a AandE ointment in order to help keep everything moist and the scar tissue region while the alginate keeps it dry enough to heal 11/14/2020 upon evaluation today patient appears to be doing well at this point in regard to her wound. I do feel like that she is making good progress again this is a very difficult region over the surgical site of the right chest wall at the site of mastectomy. Nonetheless I think that we are just a very small area  still open I think alginate is doing a good job helping to dry this up and I would recommend this such that we continue with this. 01/02/2021 on evaluation today patient's wound actually appears to be doing decently well. There does not appear to be any signs of active infection which is great news. She does have some slight slough buildup with biofilm  on the surface of the wound mainly more biofilm. Nonetheless I was able to gently remove this with saline and gauze as well as a sterile Q-tip. She tolerated that today without complication. 01/30/2021 upon evaluation today patient appears to be doing well with regard to her wound although she still has quite a bit of trouble getting this to close I think the scar tissue and radiation damage is quite significant here unfortunately. There does not appear to be any signs of active infection which is great news. No fevers, chills, nausea, vomiting, or diarrhea. 02/27/2021 upon evaluation today patient appears to be doing excellent in regard to her wound. She has been tolerating the dressing changes without complication and in general I am extremely pleased with where things stand today. There does not appear to be any signs of infection which is great news. No fevers, chills, nausea, vomiting, or diarrhea. With that being said I do think that she still developing some slough buildup. I did discuss with her today the possibility of proceeding with HBO therapy. We have had this discussion before but she really is not quite committed to wanting to do that much and spend that much time in the chamber. She wants to still think about it. 03/27/2021 upon evaluation today patient appears to be doing about the same in regard to her wound. She still developing a lot of slough buildup on the surface of the wound. I did try to clean that away to some degree today. She tolerated that without any pain or complication. Subsequently I am thinking we may want to switch over to [facility] see if this may do better for her. Patient is in agreement with giving that a trial. 05/01/2021 upon evaluation today patient appears to be doing about the same in regard to her wounds. She has been tolerating the dressing changes without complication. Fortunately there does not appear to be any signs of active infection at this time which is  great news. No fevers, chills, nausea, vomiting, or diarrhea.. 06/05/2021 upon evaluation today patient appears to be doing pretty well currently in regard to her wound at the mastectomy site right chest wall. Overall since we switch back to the alginate she tells me things have significantly improved she is much happier with where things stand currently. Fortunately there does not appear to be any signs of active infection which is great news. No fevers, chills, nausea, vomiting, or diarrhea. 07/10/2021 upon evaluation today patient unfortunately does not appear to be doing nearly as well as what she previously was. There does not appear to be any evidence of new epithelial growth she has a lot of necrotic tissue the base of the wound this is much more significant than what we previously noted. She also has been having increased pain and increased drainage. In general I am very concerned about this especially in light of the fact that she does have a history of breast cancer I want to ensure that were not looking at any cancerous type lesion at this point. Otherwise also think she does need some debridement we did do MolecuLight scanning today. 07/17/2021 upon  evaluation today patient appears to be doing well with regard to her wound compared to last week although she still having some erythema I am concerned in this regard. I do think that she fortunately had a negative biopsy which is great news unfortunately she did have Pseudomonas noted as a bacteria present in the culture which I think is good and need to be addressed with Levaquin. I Minna send that into the pharmacy today. We did repeat the MolecuLight screening. 07/31/2021 upon evaluation today patient's wound is actually showing signs of improvement which is great news. Fortunately there does not appear to be any SHAQUILLA, [name] [name] ([id number]) [id number] Page 2 of 8 signs of infection currently locally nor systemically  and I am very pleased in that regard. With that being said the patient is having some issues here with continued necrotic tissue I think packing with the Dakin's moistened gauze dressing is still in the right leg ago. 08/14/2021 upon evaluation today patient appears to be doing well with regard to her wound all things considered I do not see any signs of significant infection which is great news. Fortunately there does not appear to be any evidence of infection which is also great news. In general I think that the patient is tolerating the dressing changes well. We been using a Dakin's moistened gauze. 08/28/2021 upon evaluation today patient appears to be doing well with regard to her wound. Worsening signs of definite improvement which is great news and overall very pleased with where we stand today. There is no signs of inflamed tissue or infection which is great as well and I think the Dakin's is doing a great job here. 09/11/2021 upon evaluation today patient appears to be doing well with regard to her wound all things considered. I am can perform some debridement today to clear away some of the necrotic debris with that being said overall I think that she is making decently good progress here which is great news. There does not appear to be any signs of active infection also good news. That in fact is probably the best news in her mind. 09/25/2021 upon evaluation today patient appears to be doing decently well in regard to her wound. Overall I do not see any signs of infection and I think she is doing quite well. I do believe that we are making headway here although is very slow due to the scarring and the depth of the wound this is a significantly deep wound. 10/16/2021 upon evaluation today patient appears to be doing about the same in regard to the wound on her right chest wall. Fortunately there does not appear to be any signs of active infection locally nor systemically which is great news.  With that being said this still was not cleaning up quite as quickly as I would like to see. Fortunately I think that we can definitely give the Santyl another try we tried this in the past and unfortunately did not have a good result but again I think she was infected at that time as well which was part of the issue. Nonetheless I think currently that we will be able to go ahead and see what we can do about getting this little cleaner with the Santyl. 11/06/2021 upon evaluation today patient appears to be doing well with regard to her wound. This actually appears to be a little bit better with the Santyl I am happy in that regard. Fortunately I do not see any signs of active infection at  this time. No fevers, chills, nausea, vomiting, or diarrhea. 12/04/2021 upon evaluation patient appears to be doing well with the Santyl at this point. I am very pleased with where we stand and I think that she is making good progress here. Fortunately I do not see any evidence of active infection locally or systemically at this time which is great news. No fevers, chills, nausea, vomiting, or diarrhea. 01/01/2022 upon evaluation today patient actually is making excellent progress with the Santyl. I am extremely pleased with where we stand today. I do not see any signs of infection. 01-29-2022 upon evaluation today patient appears to be doing well with regard to her wound. This is showing signs of some improvement. It is a little less deep than what it was. Size wise is also slightly smaller but again there still is a significant wound in this area. Fortunately I do not see any evidence of infection and she is doing quite well. 02-26-2022 upon evaluation today patient appears to be doing well with regard to her wound. In fact this is actually the best that I have seen in a very long time. I do not see any evidence of active infection at this time locally or systemically which is great news and overall I think we are on  the right track. Nonetheless I do believe that the patient is continuing to show evidence of improvement with the Santyl week by week. 03-26-2022 upon evaluation today patient appears to be doing well with regard to the wound on her right mastectomy site. She is actually showing signs of excellent improvement in fact there was some bleeding just with a little bit of light cleaning over the area. Fortunately I do not see any signs of active infection locally or systemically at this time which is great news. 04-30-2022 upon evaluation today patient appears to be doing well currently in regard to her wound. She is actually showing some signs of improvement here which is great news. Still this is very very slow. Subsequently I do believe that she may benefit from the use of [place] topical antibiotics for short amount of time before applying a skin substitute I think EpiCord could be doing very well for her as far as that is concerned. I discussed that with her today. Fortunately I do not see any evidence of active infection locally or systemically at this time which is great news. No fevers, chills, nausea, vomiting, or diarrhea. 05-28-2022 upon evaluation today patient's wound is actually showing signs of significant improvement. Fortunately I do not see any evidence of infection at this time which is great news. No fevers, chills, nausea, vomiting, or diarrhea. With that being said I do believe that the patient is tolerating the dressing changes without complication. We been using the [facility] which I think is helpful. We do have approval for the [place]. 06-11-2022 upon evaluation today patient appears to be doing well with regard to her wound she is actually here today for the first application of [place] which I think is good to be beneficial for her. I do think that we will probably be able to keep this in place without can be the one thing of question to be honest. 06-18-2022 upon evaluation today  patient actually appears to be doing excellent today. She has 1 treatment of the EpiCord underway and the wound surface already looks much better. This will be EpiCord #2 today. Fortunately I think we are on the right track here. 06-25-2022 upon evaluation today patient appears  to be making good progress and overall the patient seems to have tolerated EpiCord without any complication. I do feel like that the wound is improving quite significantly. This is EpiCord #3 today. 07-02-2021 upon evaluation today patient's wound is actually showing signs of improvement. I am actually very pleased with where we stand we have been seeing some improvements in size overall and I think that we are still in the right track although there is a little bit of need for sharp debridement today to clearway some of the necrotic debris and remaining EpiCord which is actually still somewhat adhered to the wound bed. Overall I am extremely pleased though with where we stand. This is EpiCord #4 today. Upon evaluation today patient appears to be doing well with regard to her wound the EpiCord is definitely helping and does seem to be improving the overall status of the wound the surface is dramatically improved compared to what it was at the start of this. The tissue is actually becoming more red than it is just a pale pink and to be honest some of the did not even appear to be pink in the start. Nonetheless I am very pleased with where we stand currently. No fevers, chills, nausea, vomiting, or diarrhea. 07-16-2022 upon evaluation today patient appears to be doing well currently in regard to her wound she is doing well with the [place] this is application #6 today. 07-23-2022 upon evaluation today patient's wound actually showing signs of excellent improvement which is great news. Fortunately I think were making progress again this is a very scarred area which now has a pretty good vascular supply which is great news. Overall I  think that she is making great progress. This is EpiCord #7 today 10/18; Epicort No. 8. Very minimally smaller. Wounds on the right breast mastectomy site complicated by soft tissue radiation damage. Unfortunately she lives in [place], 5 days a week transport would be impossible for her to arrange 08-06-2022 upon evaluation today patient's wound is showing signs again of minimal improvement with regard to the wound bed again this is something that would probably respond well to hyperbaric oxygen therapy and I think should we can probably get this approved but she is just not able to make the transportation from [place] here daily for the 58-monthperiod of time. Nonetheless I do think that she seems to be doing much better with regard to the surface of the wound and I am pleased that regard I am going to go ahead and continue with the [place]o [place] is #9 application today. 08-13-2022 upon evaluation today patient's wound again is showing signs of improved quality to the tissue in the base of the wound. Fortunately I do not see any evidence of infection which is great news and overall I am extremely pleased in that regard. In general I do believe that the patient is making progress GTIMBERLYNN, KIZZIAH([id number] Page 3 of 8 here. No fevers, chills, nausea, vomiting, or diarrhea. She is here today for EpiCord application ##78124/2 patient with a wound on the lower part of the right breast area. She is apparently had a nice response to epi cord but she has completed this. It is difficult not to look over this lady's history and look at the wound and wonder about the known benefits of hyperbaric oxygen for soft tissue radionecrosis however she is unable to make [place] arrangements[place] Signed: 08/20/2022 3:30:35 PM By: RLinton HamMD Entered By:  Linton Ham on 08/20/2022  09:59:01 -------------------------------------------------------------------------------- Physical Exam Details Patient Name: Date of Service: [name], [name] 08/20/2022 9:30 A M Medical Record Number: [id number] Patient Account Number: [id number] Date of Birth/Sex: Treating RN: 29-Jul-1951 (71 y.o. F) Primary Care Provider: [name] Other Clinician: Referring Provider: Treating Provider/Extender: Adolm Joseph, [name] in Treatment: 104 Constitutional Patient is hypertensive.. Pulse regular and within target range for patient.Marland Kitchen Respirations regular, non-labored and within target range.. Temperature is normal and within the target range for the patient.Marland Kitchen Appears in no distress. Notes Wound exam; right breast area. This is a smaller wound with healthy granulation surface although it is still deep against the chest wall. There is no evidence of infection minimal undermining. No need for debridement and no evidence of soft tissue infection Electronic Signature(s) Signed: 08/20/2022 3:30:35 PM By: Linton Ham MD Entered By: Linton Ham on 08/20/2022 09:59:46 -------------------------------------------------------------------------------- Physician Orders Details Patient Name: Date of Service: [name], [name] 08/20/2022 9:30 A M Medical Record Number: [id number] Patient Account Number: [id number] Date of Birth/Sex: Treating RN: 08-Nov-1950 (71 y.o. Benjaman Lobe Primary Care Provider: [name] Other Clinician: Referring Provider: Treating Provider/Extender: Adolm Joseph, [name] in Treatment: [phone] Verbal / Phone Orders: No Diagnosis Coding Follow-up Appointments ppointment in 1 week. Jeri Cos, PA Wednesday Return A Anesthetic (In clinic) Topical Lidocaine 5% applied to wound bed Cellular or Tissue Based Products Cellular or Tissue Based Product Type: - Order Epicord for application on 0/25/42=706% covered daptic or  Mepitel. (DO NOT REMOVE). - Cellular or Tissue Based Product applied to wound bed, secured with steri-strips, cover with A Epicord #1 06/11/22, #2 06/18/22, #3 06/25/22, #4 07/02/22, #5 07/09/22, #6 07/16/22, Epicord #7 07/23/22, Epicord #8 07/30/22, Epicord #9 08/06/2022. Epicord # 10 08/13/22 - no more after 23JS application, per PA we will see how she does after this with collagen for now. Bathing/ Shower/ Hygiene May shower with protection but do not get wound dressing(s) wet. Additional Orders / Instructions [name], [name] [name] ([id number]) [id number] Page 4 of 8 Follow Nutritious Diet Wound Treatment Wound #1 - Breast (mastectomy site) Wound Laterality: Right Cleanser: Wound Cleanser (DME) (Generic) 1 x Per Day/30 Days Discharge Instructions: Cleanse the wound with wound cleanser prior to applying a clean dressing using gauze sponges, not tissue or cotton balls. Cleanser: Soap and Water 1 x Per Day/30 Days Discharge Instructions: May shower and wash wound with dial antibacterial soap and water prior to dressing change. Peri-Wound Care: Skin Prep (DME) (Generic) 1 x Per Day/30 Days Discharge Instructions: Use skin prep as directed Prim Dressing: Promogran Prisma Matrix, 4.34 (sq in) (silver collagen) (DME) (Generic) 1 x Per Day/30 Days ary Discharge Instructions: Moisten collagen with saline or hydrogel Secondary Dressing: Woven Gauze Sponges 2x2 in (DME) (Generic) 1 x Per Day/30 Days Discharge Instructions: Apply over primary dressing, fold in corners to fill the space Secondary Dressing: Zetuvit Plus Silicone Border Dressing 5x5 (in/in) (DME) (Generic) 1 x Per Day/30 Days Discharge Instructions: Apply silicone border over primary dressing as directed. Electronic Signature(s) Signed: 08/20/2022 3:30:35 PM By: Linton Ham MD Signed: 08/20/2022 3:55:23 PM By: Rhae Hammock RN Entered By: Rhae Hammock on 08/20/2022  09:49:31 -------------------------------------------------------------------------------- Problem List Details Patient Name: Date of Service: [name], [place] YE 08/20/2022 9:30 A M Medical Record Number: [id number] Patient Account Number: [id number] Date of Birth/Sex: Treating RN: 04/03/1951 (71 y.o. F) Primary Care Provider: [name] Other Clinician: Referring Provider: Treating Provider/Extender: Adolm Joseph, [name] in Treatment: [phone]  Active Problems ICD-10 Encounter Code Description Active Date MDM Diagnosis L98.492 Non-pressure chronic ulcer of skin of other sites with fat layer exposed 08/22/2020 No Yes L58.9 Radiodermatitis, unspecified 08/22/2020 No Yes L59.8 Other specified disorders of the skin and subcutaneous tissue related to 08/22/2020 No Yes radiation I10 Essential (primary) hypertension 08/22/2020 No Yes Inactive Problems Resolved Problems Electronic Signature(s) Signed: 08/20/2022 3:30:35 PM By: Linton Ham MD [name] [name] [phone] PM By: Linton Ham MD [id number] Page 5 of 8 Signed: 08/20/2022 Entered By: Linton Ham on 08/20/2022 09:57:27 -------------------------------------------------------------------------------- Progress Note Details Patient Name: Date of Service: [name], [name] 08/20/2022 9:30 A M Medical Record Number: [id number] Patient Account Number: [id number] Date of Birth/Sex: Treating RN: 1951-01-22 (71 y.o. F) Primary Care Provider: [name] Other Clinician: Referring Provider: Treating Provider/Extender: Adolm Joseph, [name] in Treatment: 104 Subjective History of Present Illness (HPI) 08/22/2020 upon evaluation today patient actually appears to be doing somewhat poorly in regard to her mastectomy site on the right chest wall. She had a mastectomy initially in 1998. She had chemotherapy only no radiation following. In 2002 she subsequently did  undergo radiation for the first time and then subsequently in 2012 had repeat radiation after having had a finding that was consistent with a relapse of the cancer. 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 She also has radiation damage to the skin over the right chest wall where she had the mastectomy. She was referred to [place] by Dr. Matthew Saras in [facility] and her oncologist is Dr. Burney Gauze. Subsequently I want to make sure before we delve into this more deeply that the patient does not have any issues with a return of cancer that needs to be managed by them. 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 Nonetheless she does not remember exactly when her last PET scan was but it was not too recently she tells me. 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 The patient does have hypertension as well. She sees her oncologist next on December 6. 09/12/2020 on evaluation today patient actually appears to be doing excellent in regard to her wound under the right breast location. Currently this is measuring smaller in general seems to be doing very well. She tells me that the collagen is doing a good job and that the dressing that she has been putting on is not causing any troubles as far pulling on her skin or scar tissue is concerned all of which is excellent news. 10/03/2020 upon evaluation today patient appears to be doing well with regard to her surgical site from her mastectomy on the right breast region. She tells me that she has had very little bleeding nothing seems to be sticking badly and in general she has been extremely pleased with where things stand today. No fevers, chills, nausea, vomiting, or diarrhea. 10/24/2020 upon evaluation today patient actually appears to be doing  excellent in regard to her wound. There is just a very small area still open and to be honest there was minimal slough noted on the surface of the wound nothing that requires sharp debridement. In general I feel like she is actually doing quite well and overall I am pleased. I do think we may switch to silver alginate dressing and also contemplating using a AandE  ointment in order to help keep everything moist and the scar tissue region while the alginate keeps it dry enough to heal 11/14/2020 upon evaluation today patient appears to be doing well at this point in regard to her wound. I do feel like that she is making good progress again this is a very difficult region over the surgical site of the right chest wall at the site of mastectomy. Nonetheless I think that we are just a very small area still open I think alginate is doing a good job helping to dry this up and I would recommend this such that we continue with this. 01/02/2021 on evaluation today patient's wound actually appears to be doing decently well. There does not appear to be any signs of active infection which is great news. She does have some slight slough buildup with biofilm on the surface of the wound mainly more biofilm. Nonetheless I was able to gently remove this with saline and gauze as well as a sterile Q-tip. She tolerated that today without complication. 01/30/2021 upon evaluation today patient appears to be doing well with regard to her wound although she still has quite a bit of trouble getting this to close I think the scar tissue and radiation damage is quite significant here unfortunately. There does not appear to be any signs of active infection which is great news. No fevers, chills, nausea, vomiting, or diarrhea. 02/27/2021 upon evaluation today patient appears to be doing excellent in regard to her wound. She has been tolerating the dressing changes without complication and in general I am extremely pleased with where  things stand today. There does not appear to be any signs of infection which is great news. No fevers, chills, nausea, vomiting, or diarrhea. With that being said I do think that she still developing some slough buildup. I did discuss with her today the possibility of proceeding with HBO therapy. We have had this discussion before but she really is not quite committed to wanting to do that much and spend that much time in the chamber. She wants to still think about it. 03/27/2021 upon evaluation today patient appears to be doing about the same in regard to her wound. She still developing a lot of slough buildup on the surface of the wound. I did try to clean that away to some degree today. She tolerated that without any pain or complication. Subsequently I am thinking we may want to switch over to [facility] see if this may do better for her. Patient is in agreement with giving that a trial. 05/01/2021 upon evaluation today patient appears to be doing about the same in regard to her wounds. She has been tolerating the dressing changes without complication. Fortunately there does not appear to be any signs of active infection at this time which is great news. No fevers, chills, nausea, vomiting, or diarrhea.. 06/05/2021 upon evaluation today patient appears to be doing pretty well currently in regard to her wound at the mastectomy site right chest wall. Overall since we switch back to the alginate she tells me things have significantly improved she is much happier with where things stand currently. Fortunately there does not appear to be any signs of active infection which is great news. No fevers, chills, nausea, vomiting, or diarrhea. 07/10/2021 upon evaluation today patient unfortunately does not appear to be doing nearly as well as what she previously was. There does not appear to be any evidence of new epithelial growth she has a lot of necrotic  tissue the base of the wound this is much more significant  than what we previously noted. She also has been having increased pain and increased drainage. In general I am very concerned about this especially in light of the fact that she does have a history of breast cancer I want to ensure that were not looking at any cancerous type lesion at this point. Otherwise also think she does need some debridement we did do MolecuLight scanning today. 07/17/2021 upon evaluation today patient appears to be doing well with regard to her wound compared to last week although she still having some erythema I am concerned in this regard. I do think that she fortunately had a negative biopsy which is great news unfortunately she did have Pseudomonas noted as a bacteria present in the culture which I think is good and need to be addressed with Levaquin. I Minna send that into the pharmacy today. We did repeat the MolecuLight screening. 07/31/2021 upon evaluation today patient's wound is actually showing signs of improvement which is great news. Fortunately there does not appear to be any [name] [name], [name] [name] ([id number]) [id number] Page 6 of 8 signs of infection currently locally nor systemically and I am very pleased in that regard. With that being said the patient is having some issues here with continued necrotic tissue I think packing with the Dakin's moistened gauze dressing is still in the right leg ago. 08/14/2021 upon evaluation today patient appears to be doing well with regard to her wound all things considered I do not see any signs of significant infection which is great news. Fortunately there does not appear to be any evidence of infection which is also great news. In general I think that the patient is tolerating the dressing changes well. We been using a Dakin's moistened gauze. 08/28/2021 upon evaluation today patient appears to be doing well with regard to her wound. Worsening signs of definite improvement which is great news and overall  very pleased with where we stand today. There is no signs of inflamed tissue or infection which is great as well and I think the Dakin's is doing a great job here. 09/11/2021 upon evaluation today patient appears to be doing well with regard to her wound all things considered. I am can perform some debridement today to clear away some of the necrotic debris with that being said overall I think that she is making decently good progress here which is great news. There does not appear to be any signs of active infection also good news. That in fact is probably the best news in her mind. 09/25/2021 upon evaluation today patient appears to be doing decently well in regard to her wound. Overall I do not see any signs of infection and I think she is doing quite well. I do believe that we are making headway here although is very slow due to the scarring and the depth of the wound this is a significantly deep wound. 10/16/2021 upon evaluation today patient appears to be doing about the same in regard to the wound on her right chest wall. Fortunately there does not appear to be any signs of active infection locally nor systemically which is great news. With that being said this still was not cleaning up quite as quickly as I would like to see. Fortunately I think that we can definitely give the Santyl another try we tried this in the past and unfortunately did not have a good result but again I think she was infected  at that time as well which was part of the issue. Nonetheless I think currently that we will be able to go ahead and see what we can do about getting this little cleaner with the Santyl. 11/06/2021 upon evaluation today patient appears to be doing well with regard to her wound. This actually appears to be a little bit better with the Santyl I am happy in that regard. Fortunately I do not see any signs of active infection at this time. No fevers, chills, nausea, vomiting, or diarrhea. 12/04/2021 upon  evaluation patient appears to be doing well with the Santyl at this point. I am very pleased with where we stand and I think that she is making good progress here. Fortunately I do not see any evidence of active infection locally or systemically at this time which is great news. No fevers, chills, nausea, vomiting, or diarrhea. 01/01/2022 upon evaluation today patient actually is making excellent progress with the Santyl. I am extremely pleased with where we stand today. I do not see any signs of infection. 01-29-2022 upon evaluation today patient appears to be doing well with regard to her wound. This is showing signs of some improvement. It is a little less deep than what it was. Size wise is also slightly smaller but again there still is a significant wound in this area. Fortunately I do not see any evidence of infection and she is doing quite well. 02-26-2022 upon evaluation today patient appears to be doing well with regard to her wound. In fact this is actually the best that I have seen in a very long time. I do not see any evidence of active infection at this time locally or systemically which is great news and overall I think we are on the right track. Nonetheless I do believe that the patient is continuing to show evidence of improvement with the Santyl week by week. 03-26-2022 upon evaluation today patient appears to be doing well with regard to the wound on her right mastectomy site. She is actually showing signs of excellent improvement in fact there was some bleeding just with a little bit of light cleaning over the area. Fortunately I do not see any signs of active infection locally or systemically at this time which is great news. 04-30-2022 upon evaluation today patient appears to be doing well currently in regard to her wound. She is actually showing some signs of improvement here which is great news. Still this is very very slow. Subsequently I do believe that she may benefit from the use  of [place]stone topical antibiotics for short amount of time before applying a skin substitute I think EpiCord could be doing very well for her as far as that is concerned. I discussed that with her today. Fortunately I do not see any evidence of active infection locally or systemically at this time which is great news. No fevers, chills, nausea, vomiting, or diarrhea. 05-28-2022 upon evaluation today patient's wound is actually showing signs of significant improvement. Fortunately I do not see any evidence of infection at this time which is great news. No fevers, chills, nausea, vomiting, or diarrhea. With that being said I do believe that the patient is tolerating the dressing changes without complication. We been using the [facility] which I think is helpful. We do have approval for the [place]. 06-11-2022 upon evaluation today patient appears to be doing well with regard to her wound she is actually here today for the first application of [place] which I think is  good to be beneficial for her. I do think that we will probably be able to keep this in place without can be the one thing of question to be honest. 06-18-2022 upon evaluation today patient actually appears to be doing excellent today. She has 1 treatment of the EpiCord underway and the wound surface already looks much better. This will be EpiCord #2 today. Fortunately I think we are on the right track here. 06-25-2022 upon evaluation today patient appears to be making good progress and overall the patient seems to have tolerated EpiCord without any complication. I do feel like that the wound is improving quite significantly. This is EpiCord #3 today. 07-02-2021 upon evaluation today patient's wound is actually showing signs of improvement. I am actually very pleased with where we stand we have been seeing some improvements in size overall and I think that we are still in the right track although there is a little bit of need for sharp debridement  today to clearway some of the necrotic debris and remaining EpiCord which is actually still somewhat adhered to the wound bed. Overall I am extremely pleased though with where we stand. This is EpiCord #4 today. Upon evaluation today patient appears to be doing well with regard to her wound the EpiCord is definitely helping and does seem to be improving the overall status of the wound the surface is dramatically improved compared to what it was at the start of this. The tissue is actually becoming more red than it is just a pale pink and to be honest some of the did not even appear to be pink in the start. Nonetheless I am very pleased with where we stand currently. No fevers, chills, nausea, vomiting, or diarrhea. 07-16-2022 upon evaluation today patient appears to be doing well currently in regard to her wound she is doing well with the [place] this is application #6 today. 07-23-2022 upon evaluation today patient's wound actually showing signs of excellent improvement which is great news. Fortunately I think were making progress again this is a very scarred area which now has a pretty good vascular supply which is great news. Overall I think that she is making great progress. This is EpiCord #7 today 10/18; Epicort No. 8. Very minimally smaller. Wounds on the right breast mastectomy site complicated by soft tissue radiation damage. Unfortunately she lives in [place], 5 days a week transport would be impossible for her to arrange 08-06-2022 upon evaluation today patient's wound is showing signs again of minimal improvement with regard to the wound bed again this is something that would probably respond well to hyperbaric oxygen therapy and I think should we can probably get this approved but she is just not able to make the transportation from [place] here daily for the 64-monthperiod of time. Nonetheless I do think that she seems to be doing much better with regard to the surface of the wound and  I am pleased that regard I am going to go ahead and continue with the EMascotthis is #9 application today. 08-13-2022 upon evaluation today patient's wound again is showing signs of improved quality to the tissue in the base of the wound. Fortunately I do not see any evidence of infection which is great news and overall I am extremely pleased in that regard. In general I do believe that the patient is making progress [name] [name], [name] [name]([id number] Page 7 of 8 here. No fevers, chills, nausea, vomiting, or diarrhea. She is here today for ELennar Corporationapplication #  10 11/8; patient with a wound on the lower part of the right breast area. She is apparently had a nice response to epi cord but she has completed this. It is difficult not to look over this lady's history and look at the wound and wonder about the known benefits of hyperbaric oxygen for soft tissue radionecrosis however she is unable to make transportation arrangements. She lives in [place] Objective Constitutional Patient is hypertensive.. Pulse regular and within target range for patient.Marland Kitchen Respirations regular, non-labored and within target range.. Temperature is normal and within the target range for the patient.Marland Kitchen Appears in no distress. Vitals Time Taken: 9:19 AM, Height: 63 in, Weight: 176 lbs, BMI: 31.2, Temperature: 97.8 F, Pulse: 61 bpm, Respiratory Rate: 18 breaths/min, Blood Pressure: 150/76 mmHg. General Notes: Wound exam; right breast area. This is a smaller wound with healthy granulation surface although it is still deep against the chest wall. There is no evidence of infection minimal undermining. No need for debridement and no evidence of soft tissue infection Integumentary (Hair, Skin) Wound #1 status is Open. Original cause of wound was Radiation Burn. The date acquired was: 07/26/2020. The wound has been in treatment 104 weeks. The wound is located on the Right Breast (mastectomy site). The  wound measures 1.5cm length x 2.5cm width x 0.3cm depth; 2.945cm^2 area and 0.884cm^3 volume. There is Fat Layer (Subcutaneous Tissue) exposed. There is no tunneling or undermining noted. There is a medium amount of serosanguineous drainage noted. The wound margin is distinct with the outline attached to the wound base. There is large (67-100%) red, friable granulation within the wound bed. There is a small (1-33%) amount of necrotic tissue within the wound bed including Adherent Slough. The periwound skin appearance had no abnormalities noted for texture. The periwound skin appearance had no abnormalities noted for moisture. The periwound skin appearance did not exhibit: Atrophie Blanche, Cyanosis, Ecchymosis, Hemosiderin Staining, Mottled, Pallor, Rubor, Erythema. Periwound temperature was noted as No Abnormality. 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 Jeri Cos, PA Wednesday Anesthetic: (In clinic) Topical Lidocaine 5% applied to wound bed Cellular or Tissue Based Products: Cellular or Tissue Based Product Type: - Order Epicord for application on 6/73/41=937% covered Cellular or Tissue Based Product applied to wound bed, secured with steri-strips, cover with Adaptic or Mepitel. (DO NOT REMOVE). - Epicord #1 06/11/22, #2 06/18/22, #3 06/25/22, #4 07/02/22, #5 07/09/22, #6 07/16/22, Epicord #7 07/23/22, Epicord #8 07/30/22, Epicord #9 08/06/2022. Epicord # 10 08/13/22 - no more after 90WI application, per PA we will see how she does after this with collagen for now. Bathing/ Shower/ Hygiene: May shower with protection but do not get wound dressing(s) wet. Additional Orders / Instructions: Follow Nutritious Diet WOUND #1: -  Breast (mastectomy site) Wound Laterality: Right Cleanser: Wound Cleanser (DME) (Generic) 1 x Per Day/30 Days Discharge Instructions: Cleanse the wound with wound cleanser prior to applying a clean dressing using gauze sponges, not tissue or cotton balls. Cleanser: Soap and Water 1 x Per Day/30 Days Discharge Instructions: May shower and wash wound with dial antibacterial soap and water prior to dressing change. Peri-Wound Care: Skin Prep (DME) (Generic) 1 x Per Day/30 Days Discharge Instructions: Use skin prep as directed Prim Dressing: Promogran Prisma Matrix, 4.34 (sq in) (silver collagen) (DME) (Generic) 1  x Per Day/30 Days ary Discharge Instructions: Moisten collagen with saline or hydrogel Secondary Dressing: Woven Gauze Sponges 2x2 in (DME) (Generic) 1 x Per Day/30 Days Discharge Instructions: Apply over primary dressing, fold in corners to fill the space Secondary Dressing: Zetuvit Plus Silicone Border Dressing 5x5 (in/in) (DME) (Generic) 1 x Per Day/30 Days Discharge Instructions: Apply silicone border over primary dressing as directed. 1. I changed her to Prisma hydrogel. She will change this every second day [name] [name], [name] [name] ([id number]) [id number] Page 8 of 8 2. Went over the hyperbaric issue with her again, she is still unable to consider this because of transportation issues. I told her that if this ever could be ironed out then we certainly could aim to get her approved to start therapy Electronic Signature(s) Signed: 08/20/2022 3:30:35 PM By: Linton Ham MD Entered By: Linton Ham on 08/20/2022 10:00:32 -------------------------------------------------------------------------------- SuperBill Details Patient Name: Date of Service: [name], [name] 08/20/2022 Medical Record Number: [id number] Patient Account Number: [id number] Date of Birth/Sex: Treating RN: 03-Dec-1950 (71 y.o. [name] [name], [name] Primary Care Provider: [name] Other  Clinician: Referring Provider: Treating Provider/Extender: Adolm Joseph, [name] in Treatment: 104 Diagnosis Coding ICD-10 Codes Code Description [phone] Non-pressure chronic ulcer of skin of other sites with fat layer exposed L58.9 Radiodermatitis, unspecified L59.8 Other specified disorders of the skin and subcutaneous tissue related to radiation I10 Essential (primary) hypertension Facility Procedures : The patient participates with Medicare or their insurance follows the Medicare Facility Guidelines: CPT4 Code Description Modifier Quantity [id number] [place] VISIT-LEV 3 EST PT 1 Physician Procedures : CPT4 Code Description Modifier [id number] - WC PHYS LEVEL 3 - EST PT ICD-10 Diagnosis Description L98.492 Non-pressure chronic ulcer of skin of other sites with fat layer exposed L58.9 Radiodermatitis, unspecified Quantity: 1 Electronic Signature(s) Signed: 08/20/2022 3:30:35 PM By: Linton Ham MD Entered By: Linton Ham on 08/20/2022 10:00:46

## 2022-08-27 ENCOUNTER — Encounter (HOSPITAL_BASED_OUTPATIENT_CLINIC_OR_DEPARTMENT_OTHER): Payer: Medicare Other | Admitting: Physician Assistant

## 2022-08-27 DIAGNOSIS — Z5111 Encounter for antineoplastic chemotherapy: Secondary | ICD-10-CM | POA: Diagnosis not present

## 2022-08-27 DIAGNOSIS — L589 Radiodermatitis, unspecified: Secondary | ICD-10-CM | POA: Diagnosis not present

## 2022-08-27 DIAGNOSIS — L598 Other specified disorders of the skin and subcutaneous tissue related to radiation: Secondary | ICD-10-CM | POA: Diagnosis not present

## 2022-08-27 DIAGNOSIS — Z9011 Acquired absence of right breast and nipple: Secondary | ICD-10-CM | POA: Diagnosis not present

## 2022-08-27 DIAGNOSIS — L98492 Non-pressure chronic ulcer of skin of other sites with fat layer exposed: Secondary | ICD-10-CM | POA: Diagnosis not present

## 2022-08-27 DIAGNOSIS — I10 Essential (primary) hypertension: Secondary | ICD-10-CM | POA: Diagnosis not present

## 2022-08-27 NOTE — Progress Notes (Addendum)
CRETA, DORAME ([id number]) [id number] Page 1 of 9 Visit Report for 08/27/2022 Chief Complaint Document Details Patient Name: Date of Service: [name], [name] 08/27/2022 9:30 A M Medical Record Number: [id number] Patient Account Number: [id number] Date of Birth/Sex: Treating RN: 06-Dec-1950 (71 y.o. 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 F) Primary Care Provider: Maryella [name] Other Clinician: Referring Provider: Treating Provider/Extender: [name], [name]: 105 History of Present Illness HPI Description: 08/22/2020 upon evaluation today patient actually appears to be doing somewhat poorly in regard to her mastectomy site on the right chest wall. She had a mastectomy initially in 1998. She had chemotherapy only no radiation following. In 2002 she subsequently did undergo radiation for the first time and then subsequently in 2012 had repeat radiation after having had a finding that was consistent with a relapse of the cancer. 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 She also has radiation damage to the skin over the right chest wall where she had the mastectomy. She was referred to [place] by Dr. Matthew Saras in [facility] and her oncologist  is Dr. Burney Gauze. Subsequently I want to make sure before we delve into this more deeply that the patient does not have any issues with a return of cancer that needs to be managed by them. 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 Nonetheless she does not remember exactly when her last PET scan was but it was not too recently she tells me. 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 The patient does have hypertension as well. She sees her oncologist next on December 6. 09/12/2020 on evaluation today patient actually appears to be doing excellent in regard to her wound under the right breast location. Currently this is measuring smaller in general seems to be doing very well. She tells me that the collagen is doing a good job and that the dressing that she has been putting on is not causing any troubles as far pulling on her skin or scar tissue is concerned all of which is excellent news. 10/03/2020 upon evaluation today patient appears to be doing well with regard to her surgical site from her mastectomy on the right breast region. She tells me that she has had very little bleeding nothing seems to be sticking badly and in general she has been extremely pleased with where things stand today. No fevers, chills, nausea, vomiting, or diarrhea. 10/24/2020 upon evaluation today patient actually appears to be doing excellent in regard to her wound. There is just a very small area still open and to be honest there was minimal slough noted on the surface of the wound nothing that requires sharp debridement. In general I feel like she is actually doing quite well and overall I am pleased. I do think we may switch to silver alginate dressing  and also contemplating using a AandE ointment in order to help keep everything moist and the scar tissue  region while the alginate keeps it dry enough to heal 11/14/2020 upon evaluation today patient appears to be doing well at this point in regard to her wound. I do feel like that she is making good progress again this is a very difficult region over the surgical site of the right chest wall at the site of mastectomy. Nonetheless I think that we are just a very small area still open I think alginate is doing a good job helping to dry this up and I would recommend this such that we continue with this. 01/02/2021 on evaluation today patient's wound actually appears to be doing decently well. There does not appear to be any signs of active infection which is great news. She does have some slight slough buildup with biofilm on the surface of the wound mainly more biofilm. Nonetheless I was able to gently remove this with saline and gauze as well as a sterile Q-tip. She tolerated that today without complication. [name], [name] ([id number]) [id number] Page 2 of 9 01/30/2021 upon evaluation today patient appears to be doing well with regard to her wound although she still has quite a bit of trouble getting this to close I think the scar tissue and radiation damage is quite significant here unfortunately. There does not appear to be any signs of active infection which is great news. No fevers, chills, nausea, vomiting, or diarrhea. 02/27/2021 upon evaluation today patient appears to be doing excellent in regard to her wound. She has been tolerating the dressing changes without complication and in general I am extremely pleased with where things stand today. There does not appear to be any signs of infection which is great news. No fevers, chills, nausea, vomiting, or diarrhea. With that being said I do think that she still developing some slough buildup. I did discuss with her today the possibility of proceeding with HBO therapy. We have had this discussion before but she really is not quite  committed to wanting to do that much and spend that much time in the chamber. She wants to still think about it. 03/27/2021 upon evaluation today patient appears to be doing about the same in regard to her wound. She still developing a lot of slough buildup on the surface of the wound. I did try to clean that away to some degree today. She tolerated that without any pain or complication. Subsequently I am thinking we may want to switch over to [facility] see if this may do better for her. Patient is in agreement with giving that a trial. 05/01/2021 upon evaluation today patient appears to be doing about the same in regard to her wounds. She has been tolerating the dressing changes without complication. Fortunately there does not appear to be any signs of active infection at this time which is great news. No fevers, chills, nausea, vomiting, or diarrhea.. 06/05/2021 upon evaluation today patient appears to be doing pretty well currently in regard to her wound at the mastectomy site right chest wall. Overall since we switch back to the alginate she tells me things have significantly improved she is much happier with where things stand currently. Fortunately there does not appear to be any signs of active infection which is great news. No fevers, chills, nausea, vomiting, or diarrhea. 07/10/2021 upon evaluation today patient unfortunately does not appear to be doing nearly as well as what she previously was. There does not  appear to be any evidence of new epithelial growth she has a lot of necrotic tissue the base of the wound this is much more significant than what we previously noted. She also has been having increased pain and increased drainage. In general I am very concerned about this especially in light of the fact that she does have a history of breast cancer I want to ensure that were not looking at any cancerous type lesion at this point. Otherwise also think she does need some debridement we did do  MolecuLight scanning today. 07/17/2021 upon evaluation today patient appears to be doing well with regard to her wound compared to last week although she still having some erythema I am concerned in this regard. I do think that she fortunately had a negative biopsy which is great news unfortunately she did have Pseudomonas noted as a bacteria present in the culture which I think is good and need to be addressed with Levaquin. I Minna send that into the pharmacy today. We did repeat the MolecuLight screening. 07/31/2021 upon evaluation today patient's wound is actually showing signs of improvement which is great news. Fortunately there does not appear to be any signs of infection currently locally nor systemically and I am very pleased in that regard. With that being said the patient is having some issues here with continued necrotic tissue I think packing with the Dakin's moistened gauze dressing is still in the right leg ago. 08/14/2021 upon evaluation today patient appears to be doing well with regard to her wound all things considered I do not see any signs of significant infection which is great news. Fortunately there does not appear to be any evidence of infection which is also great news. In general I think that the patient is tolerating the dressing changes well. We been using a Dakin's moistened gauze. 08/28/2021 upon evaluation today patient appears to be doing well with regard to her wound. Worsening signs of definite improvement which is great news and overall very pleased with where we stand today. There is no signs of inflamed tissue or infection which is great as well and I think the Dakin's is doing a great job here. 09/11/2021 upon evaluation today patient appears to be doing well with regard to her wound all things considered. I am can perform some debridement today to clear away some of the necrotic debris with that being said overall I think that she is making decently good progress  here which is great news. There does not appear to be any signs of active infection also good news. That in fact is probably the best news in her mind. 09/25/2021 upon evaluation today patient appears to be doing decently well in regard to her wound. Overall I do not see any signs of infection and I think she is doing quite well. I do believe that we are making headway here although is very slow due to the scarring and the depth of the wound this is a significantly deep wound. 10/16/2021 upon evaluation today patient appears to be doing about the same in regard to the wound on her right chest wall. Fortunately there does not appear to be any signs of active infection locally nor systemically which is great news. With that being said this still was not cleaning up quite as quickly as I would like to see. Fortunately I think that we can definitely give the Santyl another try we tried this in the past and unfortunately did not have a good result  but again I think she was infected at that time as well which was part of the issue. Nonetheless I think currently that we will be able to [name] ahead and see what we can do about getting this little cleaner with the Santyl. 11/06/2021 upon evaluation today patient appears to be doing well with regard to her wound. This actually appears to be a little bit better with the Santyl I am happy in that regard. Fortunately I do not see any signs of active infection at this time. No fevers, chills, nausea, vomiting, or diarrhea. 12/04/2021 upon evaluation patient appears to be doing well with the Santyl at this point. I am very pleased with where we stand and I think that she is making good progress here. Fortunately I do not see any evidence of active infection locally or systemically at this time which is great news. No fevers, chills, nausea, vomiting, or diarrhea. 01/01/2022 upon evaluation today patient actually is making excellent progress with the Santyl. I am extremely  pleased with where we stand today. I do not see any signs of infection. 01-29-2022 upon evaluation today patient appears to be doing well with regard to her wound. This is showing signs of some improvement. It is a little less deep than what it was. Size wise is also slightly smaller but again there still is a significant wound in this area. Fortunately I do not see any evidence of infection and she is doing quite well. 02-26-2022 upon evaluation today patient appears to be doing well with regard to her wound. In fact this is actually the best that I have seen in a very long time. I do not see any evidence of active infection at this time locally or systemically which is great news and overall I think we are on the right track. Nonetheless I do believe that the patient is continuing to show evidence of improvement with the Santyl week by week. 03-26-2022 upon evaluation today patient appears to be doing well with regard to the wound on her right mastectomy site. She is actually showing signs of excellent improvement in fact there was some bleeding just with a little bit of light cleaning over the area. Fortunately I do not see any signs of active infection locally or systemically at this time which is great news. 04-30-2022 upon evaluation today patient appears to be doing well currently in regard to her wound. She is actually showing some signs of improvement here which is great news. Still this is very very slow. Subsequently I do believe that she may benefit from the use of [place] topical antibiotics for short amount of time before applying a skin substitute I think EpiCord could be doing very well for her as far as that is concerned. I discussed that with her today. Fortunately I do not see any evidence of active infection locally or systemically at this time which is great news. No fevers, chills, nausea, vomiting, or diarrhea. 05-28-2022 upon evaluation today patient's wound is actually showing  signs of significant improvement. Fortunately I do not see any evidence of infection at this time which is great news. No fevers, chills, nausea, vomiting, or diarrhea. With that being said I do believe that the patient is tolerating the dressing changes without complication. We been using the [facility] which I think is helpful. We do have approval for the [place]. 06-11-2022 upon evaluation today patient appears to be doing well with regard to her wound she is actually here today for the first  application of Melissa which [name], [name] ([id number]) [id number] Page 3 of 9 think is good to be beneficial for her. I do think that we will probably be able to keep this in place without can be the one thing of question to be honest. 06-18-2022 upon evaluation today patient actually appears to be doing excellent today. She has 1 treatment of the EpiCord underway and the wound surface already looks much better. This will be EpiCord #2 today. Fortunately I think we are on the right track here. 06-25-2022 upon evaluation today patient appears to be making good progress and overall the patient seems to have tolerated EpiCord without any complication. I do feel like that the wound is improving quite significantly. This is EpiCord #3 today. 07-02-2021 upon evaluation today patient's wound is actually showing signs of improvement. I am actually very pleased with where we stand we have been seeing some improvements in size overall and I think that we are still in the right track although there is a little bit of need for sharp debridement today to clearway some of the necrotic debris and remaining EpiCord which is actually still somewhat adhered to the wound bed. Overall I am extremely pleased though with where we stand. This is EpiCord #4 today. Upon evaluation today patient appears to be doing well with regard to her wound the EpiCord is definitely helping and does seem to be improving the  overall status of the wound the surface is dramatically improved compared to what it was at the start of this. The tissue is actually becoming more red than it is just a pale pink and to be honest some of the did not even appear to be pink in the start. Nonetheless I am very pleased with where we stand currently. No fevers, chills, nausea, vomiting, or diarrhea. 07-16-2022 upon evaluation today patient appears to be doing well currently in regard to her wound she is doing well with the [place] this is application #6 today. 07-23-2022 upon evaluation today patient's wound actually showing signs of excellent improvement which is great news. Fortunately I think were making progress again this is a very scarred area which now has a pretty good vascular supply which is great news. Overall I think that she is making great progress. This is EpiCord #7 today 10/18; Epicort No. 8. Very minimally smaller. Wounds on the right breast mastectomy site complicated by soft tissue radiation damage. Unfortunately she [place] in [place], 5 days a week transport would be impossible for her to arrange 08-06-2022 upon evaluation today patient's wound is showing signs again of minimal improvement with regard to the wound bed again this is something that would probably respond well to hyperbaric oxygen therapy and I think should we can probably get this approved but she is just not able to make the transportation from [place] here daily for the 74-monthperiod of time. Nonetheless I do think that she seems to be doing much better with regard to the surface of the wound and I am pleased that regard I am going to [name] ahead and continue with the EMontrosethis is #9 application today. 08-13-2022 upon evaluation today patient's wound again is showing signs of improved quality to the tissue in the base of the wound. Fortunately I do not see any evidence of infection which is great news and overall I am extremely pleased in that regard.  In general I do believe that the patient is making progress here. No fevers, chills, nausea, vomiting, or diarrhea.  She is here today for EpiCord application #69 62/9; patient with a wound on the lower part of the right breast area. She is apparently had a nice response to epi cord but she has completed this. It is difficult not to look over this lady's history and look at the wound and wonder about the known benefits of hyperbaric oxygen for soft tissue radionecrosis however she is unable to make transportation arrangements. She lives in [place] 08-27-2022 upon evaluation today patient appears to be doing well in regard to her wound. She has been tolerating the dressing changes without complication wheezing using the silver collagen over the past week and she tolerated that without complication. Fortunately I see no evidence of active infection locally or systemically at this time. Electronic Signature(s) Signed: 08/27/2022 10:08:35 AM By: Worthy Keeler PA-C Entered By: Worthy Keeler on 08/27/2022 10:08:34 -------------------------------------------------------------------------------- Physical Exam Details Patient Name: Date of Service: [name], [name] 08/27/2022 9:30 A M Medical Record Number: [id number] Patient Account Number: [id number] Date of Birth/Sex: Treating RN: August 28, 1951 (71 y.o. F) Primary Care Provider: Maryella [name] Other Clinician: Referring Provider: Treating Provider/Extender: [name], [name]: 77 Constitutional [name] and well-hydrated in no acute distress. Respiratory normal breathing without difficulty. Psychiatric this patient is able to make decisions and demonstrates good insight into disease process. Alert and Oriented x 3. pleasant and cooperative. Notes Upon inspection patient's wound bed actually showed signs of good granulation and epithelization at this point. Fortunately I do not see any evidence  of infection at this time which is great news and overall I do believe that we are headed in the right direction here. No fevers, chills, nausea, vomiting, or diarrhea. Electronic Signature(s) Signed: 08/27/2022 10:11:18 AM By: Worthy Keeler PA-C Entered By: Worthy Keeler on 08/27/2022 10:11:18 [name] ([id number]) [id number] Page 4 of 9 -------------------------------------------------------------------------------- Physician Orders Details Patient Name: Date of Service: [name], [name] 08/27/2022 9:30 A M Medical Record Number: [id number] Patient Account Number: [id number] Date of Birth/Sex: Treating RN: [birth year] (71 y.o. [name], [name] Primary Care Provider: Maryella [name] Other Clinician: Referring Provider: Treating Provider/Extender: [name], [name]: [id number] Verbal / Phone Orders: No Diagnosis Coding ICD-10 Coding Code Description [phone] Non-pressure chronic ulcer of skin of other sites with fat layer exposed L58.9 Radiodermatitis, unspecified L59.8 Other specified disorders of the skin and subcutaneous tissue related to radiation I10 Essential (primary) hypertension Follow-up Appointments ppointment in 2 weeks. Jeri Cos, PA Wednesday Return A Anesthetic (In clinic) Topical Lidocaine 5% applied to wound bed Cellular or Tissue Based Products Cellular or Tissue Based Product Type: - Order Epicord for application on 4/33/29=518% covered daptic or Mepitel. (DO NOT REMOVE). - Cellular or Tissue Based Product applied to wound bed, secured with steri-strips, cover with A Epicord #1 06/11/22, #2 06/18/22, #3 06/25/22, #4 07/02/22, #5 07/09/22, #6 07/16/22, Epicord #7 07/23/22, Epicord #8 07/30/22, Epicord #9 08/06/2022. Epicord # 10 08/13/22 - no more after 84ZY application, per PA we will see how she does after this with collagen for now. Bathing/ Shower/ Hygiene May shower with protection but do not get  wound dressing(s) wet. Additional Orders / Instructions Follow Nutritious Diet Wound Treatment Wound #1 - Breast (mastectomy site) Wound Laterality: Right Cleanser: Wound Cleanser (Generic) 1 x Per Day/30 Days Discharge Instructions: Cleanse the wound with wound cleanser prior to applying a clean dressing using gauze sponges, not tissue or cotton balls. Cleanser: Soap and Water 1  x Per Day/30 Days Discharge Instructions: May shower and wash wound with dial antibacterial soap and water prior to dressing change. Peri-Wound Care: Skin Prep (DME) (Dispense As Written) 1 x Per Day/30 Days Discharge Instructions: Use skin prep as directed Prim Dressing: Promogran Prisma Matrix, 4.34 (sq in) (silver collagen) 1 x Per Day/30 Days ary Discharge Instructions: Moisten collagen with saline or hydrogel Secondary Dressing: ALLEVYN Gentle Border, 5x5 (in/in) (DME) (Generic) 1 x Per Day/30 Days Discharge Instructions: Apply over primary dressing as directed. Secondary Dressing: Woven Gauze Sponges 2x2 in (Generic) 1 x Per Day/30 Days Discharge Instructions: Apply over primary dressing, fold in corners to fill the space Electronic Signature(s) Signed: 08/27/2022 3:58:30 PM By: Worthy Keeler PA-C Signed: 08/29/2022 12:09:11 PM By: Rhae Hammock RN Entered By: Rhae Hammock on 08/27/2022 14:14:47 [name] ([id number]) [id number] Page 5 of 9 -------------------------------------------------------------------------------- Problem List Details Patient Name: Date of Service: [name], [name] 08/27/2022 9:30 A M Medical Record Number: [id number] Patient Account Number: [id number] Date of Birth/Sex: Treating RN: [birth year] (71 y.o. F) Primary Care Provider: Maryella [name] Other Clinician: Referring Provider: Treating Provider/Extender: [name], [name] in Treatment: [phone] Active Problems ICD-10 Encounter Code Description Active Date  MDM Diagnosis L98.492 Non-pressure chronic ulcer of skin of other sites with fat layer exposed 08/22/2020 No Yes L58.9 Radiodermatitis, unspecified 08/22/2020 No Yes L59.8 Other specified disorders of the skin and subcutaneous tissue related to 08/22/2020 No Yes radiation I10 Essential (primary) hypertension 08/22/2020 No Yes Inactive Problems Resolved Problems Electronic Signature(s) Signed: 08/27/2022 9:42:21 AM By: Worthy Keeler PA-C Entered By: Worthy Keeler on 08/27/2022 09:42:21 -------------------------------------------------------------------------------- Progress Note Details Patient Name: Date of Service: [name], [name] 08/27/2022 9:30 A M Medical Record Number: [id number] Patient Account Number: [id number] Date of Birth/Sex: Treating RN: 1951-08-11 (71 y.o. 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 She had a mastectomy initially in 1998. She had chemotherapy only no radiation following. In 2002 she subsequently did undergo radiation for the first time and then subsequently in 2012 had repeat radiation after having had a finding that was consistent with a relapse of the cancer. 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 She also has radiation damage to the skin over the right chest wall where she had the mastectomy. She [name], [name] ([id number]) [id number] Page 6 of 9 was referred to [place] by Dr. Matthew Saras in [facility] and her oncologist is Dr. Burney Gauze. Subsequently I want  to make sure before we delve into this more deeply that the patient does not have any issues with a return of cancer that needs to be managed by them. 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 Nonetheless she does not remember exactly when her last PET scan was but it was not too recently she tells me. 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 The patient does have hypertension as well. She sees her oncologist next on December 6. 09/12/2020 on evaluation today  patient actually appears to be doing excellent in regard to her wound under the right breast location. Currently this is measuring smaller in general seems to be doing very well. She tells me that the collagen is doing a good job and that the dressing that she has been putting on is not causing any troubles as far pulling on her skin or scar tissue is concerned all of which is excellent news. 10/03/2020 upon evaluation today patient appears to be doing well with regard to her surgical site from her mastectomy on the right breast region. She tells me that she has had very little bleeding nothing seems to be sticking badly and in general she has been extremely pleased with where things stand today. No fevers, chills, nausea, vomiting, or diarrhea. 10/24/2020 upon evaluation today patient actually appears to be doing excellent in regard to her wound. There is just a very small area still open and to be honest there was minimal slough noted on the surface of the wound nothing that requires sharp debridement. In general I feel like she is actually doing quite well and overall I am pleased. I do think we may switch to silver alginate dressing and also contemplating using a AandE ointment in order to help keep everything moist and the scar tissue region while the alginate keeps it dry enough  to heal 11/14/2020 upon evaluation today patient appears to be doing well at this point in regard to her wound. I do feel like that she is making good progress again this is a very difficult region over the surgical site of the right chest wall at the site of mastectomy. Nonetheless I think that we are just a very small area still open I think alginate is doing a good job helping to dry this up and I would recommend this such that we continue with this. 01/02/2021 on evaluation today patient's wound actually appears to be doing decently well. There does not appear to be any signs of active infection which is great news. She does have some slight slough buildup with biofilm on the surface of the wound mainly more biofilm. Nonetheless I was able to gently remove this with saline and gauze as well as a sterile Q-tip. She tolerated that today without complication. 01/30/2021 upon evaluation today patient appears to be doing well with regard to her wound although she still has quite a bit of trouble getting this to close I think the scar tissue and radiation damage is quite significant here unfortunately. There does not appear to be any signs of active infection which is great news. No fevers, chills, nausea, vomiting, or diarrhea. 02/27/2021 upon evaluation today patient appears to be doing excellent in regard to her wound. She has been tolerating the dressing changes without complication and in general I am extremely pleased with where things stand today. There does not appear to be any signs of infection which is great news. No fevers, chills, nausea, vomiting, or diarrhea. With that being said I do think that she still developing some slough buildup. I did discuss with her today the possibility of proceeding with HBO therapy. We have had this discussion before but she really is not quite committed to wanting to do that much and spend that much time in the chamber. She wants to still think about  it. 03/27/2021 upon evaluation today patient appears to be doing about the same in regard to her wound. She still developing a lot of slough buildup on the surface of  the wound. I did try to clean that away to some degree today. She tolerated that without any pain or complication. Subsequently I am thinking we may want to switch over to [facility] see if this may do better for her. Patient is in agreement with giving that a trial. 05/01/2021 upon evaluation today patient appears to be doing about the same in regard to her wounds. She has been tolerating the dressing changes without complication. Fortunately there does not appear to be any signs of active infection at this time which is great news. No fevers, chills, nausea, vomiting, or diarrhea.. 06/05/2021 upon evaluation today patient appears to be doing pretty well currently in regard to her wound at the mastectomy site right chest wall. Overall since we switch back to the alginate she tells me things have significantly improved she is much happier with where things stand currently. Fortunately there does not appear to be any signs of active infection which is great news. No fevers, chills, nausea, vomiting, or diarrhea. 07/10/2021 upon evaluation today patient unfortunately does not appear to be doing nearly as well as what she previously was. There does not appear to be any evidence of new epithelial growth she has a lot of necrotic tissue the base of the wound this is much more significant than what we previously noted. She also has been having increased pain and increased drainage. In general I am very concerned about this especially in light of the fact that she does have a history of breast cancer I want to ensure that were not looking at any cancerous type lesion at this point. Otherwise also think she does need some debridement we did do MolecuLight scanning today. 07/17/2021 upon evaluation today patient appears to be doing well with regard to her  wound compared to last week although she still having some erythema I am concerned in this regard. I do think that she fortunately had a negative biopsy which is great news unfortunately she did have Pseudomonas noted as a bacteria present in the culture which I think is good and need to be addressed with Levaquin. I Minna send that into the pharmacy today. We did repeat the MolecuLight screening. 07/31/2021 upon evaluation today patient's wound is actually showing signs of improvement which is great news. Fortunately there does not appear to be any signs of infection currently locally nor systemically and I am very pleased in that regard. With that being said the patient is having some issues here with continued necrotic tissue I think packing with the Dakin's moistened gauze dressing is still in the right leg ago. 08/14/2021 upon evaluation today patient appears to be doing well with regard to her wound all things considered I do not see any signs of significant infection which is great news. Fortunately there does not appear to be any evidence of infection which is also great news. In general I think that the patient is tolerating the dressing changes well. We been using a Dakin's moistened gauze. 08/28/2021 upon evaluation today patient appears to be doing well with regard to her wound. Worsening signs of definite improvement which is great news and overall very pleased with where we stand today. There is no signs of inflamed tissue or infection which is great as well and I think the Dakin's is doing a great job here. 09/11/2021 upon evaluation today patient appears to be doing well with regard to her wound all things considered. I am can perform some debridement today to clear away some of  the necrotic debris with that being said overall I think that she is making decently good progress here which is great news. There does not appear to be any signs of active infection also good news. That in fact  is probably the best news in her mind. 09/25/2021 upon evaluation today patient appears to be doing decently well in regard to her wound. Overall I do not see any signs of infection and I think she is doing quite well. I do believe that we are making headway here although is very slow due to the scarring and the depth of the wound this is a significantly deep wound. 10/16/2021 upon evaluation today patient appears to be doing about the same in regard to the wound on her right chest wall. Fortunately there does not appear to be any signs of active infection locally nor systemically which is great news. With that being said this still was not cleaning up quite as quickly as I would like to see. Fortunately I think that we can definitely give the Santyl another try we tried this in the past and unfortunately did not have a good result but again I think she was infected at that time as well which was part of the issue. Nonetheless I think currently that we will be able to [name] ahead and see what we can do about getting this little cleaner with the Santyl. 11/06/2021 upon evaluation today patient appears to be doing well with regard to her wound. This actually appears to be a little bit better with the Santyl I am happy in that regard. Fortunately I do not see any signs of active infection at this time. No fevers, chills, nausea, vomiting, or diarrhea. 12/04/2021 upon evaluation patient appears to be doing well with the Santyl at this point. I am very pleased with where we stand and I think that she is making good progress here. Fortunately I do not see any evidence of active infection locally or systemically at this time which is great news. No fevers, chills, nausea, vomiting, or diarrhea. [name], [name] ([id number]) [id number] Page 7 of 9 01/01/2022 upon evaluation today patient actually is making excellent progress with the Santyl. I am extremely pleased with where we stand today. I  do not see any signs of infection. 01-29-2022 upon evaluation today patient appears to be doing well with regard to her wound. This is showing signs of some improvement. It is a little less deep than what it was. Size wise is also slightly smaller but again there still is a significant wound in this area. Fortunately I do not see any evidence of infection and she is doing quite well. 02-26-2022 upon evaluation today patient appears to be doing well with regard to her wound. In fact this is actually the best that I have seen in a very long time. I do not see any evidence of active infection at this time locally or systemically which is great news and overall I think we are on the right track. Nonetheless I do believe that the patient is continuing to show evidence of improvement with the Santyl week by week. 03-26-2022 upon evaluation today patient appears to be doing well with regard to the wound on her right mastectomy site. She is actually showing signs of excellent improvement in fact there was some bleeding just with a little bit of light cleaning over the area. Fortunately I do not see any signs of active infection locally or systemically at this time which is great  news. 04-30-2022 upon evaluation today patient appears to be doing well currently in regard to her wound. She is actually showing some signs of improvement here which is great news. Still this is very very slow. Subsequently I do believe that she may benefit from the use of [place] topical antibiotics for short amount of time before applying a skin substitute I think EpiCord could be doing very well for her as far as that is concerned. I discussed that with her today. Fortunately I do not see any evidence of active infection locally or systemically at this time which is great news. No fevers, chills, nausea, vomiting, or diarrhea. 05-28-2022 upon evaluation today patient's wound is actually showing signs of significant improvement.  Fortunately I do not see any evidence of infection at this time which is great news. No fevers, chills, nausea, vomiting, or diarrhea. With that being said I do believe that the patient is tolerating the dressing changes without complication. We been using the [facility] which I think is helpful. We do have approval for the [place]. 06-11-2022 upon evaluation today patient appears to be doing well with regard to her wound she is actually here today for the first application of Briar which I think is good to be beneficial for her. I do think that we will probably be able to keep this in place without can be the one thing of question to be honest. 06-18-2022 upon evaluation today patient actually appears to be doing excellent today. She has 1 treatment of the EpiCord underway and the wound surface already looks much better. This will be EpiCord #2 today. Fortunately I think we are on the right track here. 06-25-2022 upon evaluation today patient appears to be making good progress and overall the patient seems to have tolerated EpiCord without any complication. I do feel like that the wound is improving quite significantly. This is EpiCord #3 today. 07-02-2021 upon evaluation today patient's wound is actually showing signs of improvement. I am actually very pleased with where we stand we have been seeing some improvements in size overall and I think that we are still in the right track although there is a little bit of need for sharp debridement today to clearway some of the necrotic debris and remaining EpiCord which is actually still somewhat adhered to the wound bed. Overall I am extremely pleased though with where we stand. This is EpiCord #4 today. Upon evaluation today patient appears to be doing well with regard to her wound the EpiCord is definitely helping and does seem to be improving the overall status of the wound the surface is dramatically improved compared to what it was at the start of this.  The tissue is actually becoming more red than it is just a pale pink and to be honest some of the did not even appear to be pink in the start. Nonetheless I am very pleased with where we stand currently. No fevers, chills, nausea, vomiting, or diarrhea. 07-16-2022 upon evaluation today patient appears to be doing well currently in regard to her wound she is doing well with the [place] this is application #6 today. 07-23-2022 upon evaluation today patient's wound actually showing signs of excellent improvement which is great news. Fortunately I think were making progress again this is a very scarred area which now has a pretty good vascular supply which is great news. Overall I think that she is making great progress. This is EpiCord #7 today 10/18; Epicort No. 8. Very minimally smaller. Wounds on  the right breast mastectomy site complicated by soft tissue radiation damage. Unfortunately she lives in [place], 5 days a week transport would be impossible for her to arrange 08-06-2022 upon evaluation today patient's wound is showing signs again of minimal improvement with regard to the wound bed again this is something that would probably respond well to hyperbaric oxygen therapy and I think should we can probably get this approved but she is just not able to make the transportation from [place] here daily for the 46-monthperiod of time. Nonetheless I do think that she seems to be doing much better with regard to the surface of the wound and I am pleased that regard I am going to [name] ahead and continue with the EWoodsonthis is #9 application today. 08-13-2022 upon evaluation today patient's wound again is showing signs of improved quality to the tissue in the base of the wound. Fortunately I do not see any evidence of infection which is great news and overall I am extremely pleased in that regard. In general I do believe that the patient is making progress here. No fevers, chills, nausea, vomiting, or  diarrhea. She is here today for EpiCord application ##09132/3 patient with a wound on the lower part of the right breast area. She is apparently had a nice response to epi cord but she has completed this. It is difficult not to look over this lady's history and look at the wound and wonder about the known benefits of hyperbaric oxygen for soft tissue radionecrosis however she is unable to make transportation arrangements[place] upon evaluation today patient appears to be doing well in regard to her wound. She has been tolerating the dressing changes without complication wheezing using the silver collagen over the past week and she tolerated that without complication. Fortunately I see no evidence of active infection locally or systemically at this time. Objective Constitutional [name] and well-hydrated in no acute distress. Vitals Time Taken: 9:23 AM, Height: 63 in, Weight: 176 lbs, BMI: 31.2, Temperature: 97.9 F, Pulse: 64 bpm, Respiratory Rate: 18 breaths/min, Blood Pressure: 145/84 mmHg. Respiratory normal breathing without difficulty. [name], [name]([id number] Page 8 of 9 Psychiatric this patient is able to make decisions and demonstrates good insight into disease process. Alert and Oriented x 3. pleasant and cooperative. General Notes: Upon inspection patient's wound bed actually showed signs of good granulation and epithelization at this point. Fortunately I do not see any evidence of infection at this time which is great news and overall I do believe that we are headed in the right direction here. No fevers, chills, nausea, vomiting, or diarrhea. Integumentary (Hair, Skin) Wound #1 status is Open. Original cause of wound was Radiation Burn. The date acquired was: 07/26/2020. The wound has been in treatment 105 weeks. The wound is located on the Right Breast (mastectomy site). The wound measures 1.5cm length x  2.1cm width x 0.3cm depth; 2.474cm^2 area and 0.742cm^3 volume. There is Fat Layer (Subcutaneous Tissue) exposed. There is no tunneling or undermining noted. There is a medium amount of serosanguineous drainage noted. The wound margin is distinct with the outline attached to the wound base. There is large (67-100%) red, friable granulation within the wound bed. There is a small (1-33%) amount of necrotic tissue within the wound bed including Adherent Slough. The periwound skin appearance had no abnormalities noted for texture. The periwound skin appearance had no abnormalities noted for moisture. The periwound skin appearance did not exhibit: ABlane [place]  Cyanosis, Ecchymosis, Hemosiderin Staining, Mottled, Pallor, Rubor, Erythema. Periwound temperature was noted as No Abnormality. 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 Jeri Cos, PA Wednesday Anesthetic: (In clinic) Topical Lidocaine 5% applied to wound bed Cellular or Tissue Based Products: Cellular or Tissue Based Product Type: - Order Epicord for application on 4/66/59=935% covered Cellular or Tissue Based Product applied to wound bed, secured with steri-strips, cover with Adaptic or Mepitel. (DO NOT REMOVE). - Epicord #1 06/11/22, #2 06/18/22, #3 06/25/22, #4 07/02/22, #5 07/09/22, #6 07/16/22, Epicord #7 07/23/22, Epicord #8 07/30/22, Epicord #9 08/06/2022. Epicord # 10 08/13/22 - no more after 70VX application, per PA we will see how she does after this with collagen for now. Bathing/ Shower/ Hygiene: May shower with protection but do not get wound dressing(s) wet. Additional Orders / Instructions: Follow Nutritious Diet WOUND #1: - Breast (mastectomy site) Wound  Laterality: Right Cleanser: Wound Cleanser (Generic) 1 x Per Day/30 Days Discharge Instructions: Cleanse the wound with wound cleanser prior to applying a clean dressing using gauze sponges, not tissue or cotton balls. Cleanser: Soap and Water 1 x Per Day/30 Days Discharge Instructions: May shower and wash wound with dial antibacterial soap and water prior to dressing change. Peri-Wound Care: Skin Prep (Generic) 1 x Per Day/30 Days Discharge Instructions: Use skin prep as directed Prim Dressing: Promogran Prisma Matrix, 4.34 (sq in) (silver collagen) 1 x Per Day/30 Days ary Discharge Instructions: Moisten collagen with saline or hydrogel Secondary Dressing: ALLEVYN Gentle Border, 5x5 (in/in) (DME) (Dispense As Written) 1 x Per Day/30 Days Discharge Instructions: Apply over primary dressing as directed. Secondary Dressing: Woven Gauze Sponges 2x2 in (Generic) 1 x Per Day/30 Days Discharge Instructions: Apply over primary dressing, fold in corners to fill the space 1. Recommend currently that we have the patient continue with the silver collagen which I think is doing a good job here. 2. I am also going to recommend that we have the patient continue to monitor for any signs of worsening or infection. Obviously if anything changes she knows to contact the office and let me know. We will see patient back for reevaluation in 1 week here in the clinic. If anything worsens or changes patient will contact our office for additional recommendations. Electronic Signature(s) Signed: 08/27/2022 10:17:03 AM By: Worthy Keeler PA-C Entered By: Worthy Keeler on 08/27/2022 10:17:03 [name] ([id number]) [id number] Page 9 of 9 -------------------------------------------------------------------------------- SuperBill Details Patient Name: Date of Service: [name], [name] 08/27/2022 Medical Record Number: [id number] Patient Account Number: [id number] Date of Birth/Sex: Treating  RN: [birth year] (71 y.o. [name], [name] Primary Care Provider: Maryella [name] Other Clinician: Referring Provider: Treating Provider/Extender: [name], [name]: 105 Diagnosis Coding ICD-10 Codes Code Description [id number] Non-pressure chronic ulcer of skin of other sites with fat layer exposed L58.9 Radiodermatitis, unspecified L59.8 Other specified disorders of the skin and subcutaneous tissue related to radiation I10 Essential (primary) hypertension Facility Procedures : The patient participates with Medicare or their insurance follows the Medicare Facility Guidelines: CPT4 Code Description Modifier Quantity [id number] [place] VISIT-LEV 3 EST PT 1 Physician Procedures : CPT4 Code Description Modifier [id number] - WC PHYS LEVEL 3 - EST PT ICD-10 Diagnosis Description L98.492 Non-pressure chronic ulcer of skin of other sites  with fat layer exposed L58.9 Radiodermatitis, unspecified L59.8 Other specified disorders of  the skin and subcutaneous tissue related to radiation I10 Essential (primary) hypertension Quantity: 1 Electronic Signature(s) Signed: 08/27/2022 10:17:25 AM By: Worthy Keeler PA-C Entered By: Worthy Keeler on 08/27/2022 10:17:25

## 2022-08-29 NOTE — Progress Notes (Signed)
[name], [name] ([id number]) [id number] Page 1 of 7 Visit Report for 08/27/2022 Arrival Information Details Patient Name: Date of Service: [name], [name] 08/27/2022 9:30 A M Medical Record Number: [id number] Patient Account Number: [id number] Date of Birth/Sex: Treating RN: 07-26-1951 (71 y.o. F) Primary Care Devynne Sturdivant: [name] Other Clinician: Referring Ajene Carchi: Treating Hendricks Schwandt/Extender: [name], [name]: 105 Visit Information History Since Last Visit Added or deleted any medications: No Patient Arrived: Ambulatory Any new allergies or adverse reactions: No Arrival Time: 09:20 Had a fall or experienced change in No Accompanied By: self activities of daily living that may affect Transfer Assistance: None risk of falls: Patient Identification Verified: Yes Signs or symptoms of abuse/neglect since last visito No Secondary Verification Process Completed: Yes Hospitalized since last visit: No Patient Requires Transmission-Based Precautions: No Implantable device outside of the clinic excluding No Patient Has Alerts: No cellular tissue based products placed in the center since last visit: Has Dressing in Place as Prescribed: Yes Pain Present Now: No Electronic Signature(s) Signed: 08/27/2022 4:33:40 PM By: Erenest Blank Entered By: Erenest Blank on 08/27/2022 09:23:34 -------------------------------------------------------------------------------- Clinic Level of Care Assessment Details Patient Name: Date of Service: [name], [name] 08/27/2022 9:30 A M Medical Record Number: [id number] Patient Account Number: [id number] Date of Birth/Sex: Treating RN: 04/18/1951 (71 y.o. [name], [name] Primary Care Thersia Petraglia: [name] Other Clinician: Referring Jacqueli Pangallo: Treating Damario Gillie/Extender: [name], [name]: 105 Clinic Level of Care Assessment Items TOOL 4 Quantity  Score X- 1 0 Use when only an EandM is performed on FOLLOW-UP visit ASSESSMENTS - Nursing Assessment / Reassessment X- 1 10 Reassessment of Co-morbidities (includes updates in patient status) X- 1 5 Reassessment of Adherence to Treatment Plan ASSESSMENTS - Wound and Skin A ssessment / Reassessment X - Simple Wound Assessment / Reassessment - one wound 1 5 _0  - 0 Complex Wound Assessment / Reassessment - multiple wounds _1  - 0 Dermatologic / Skin Assessment (not related to wound area) ASSESSMENTS - Focused Assessment _2  - 0 Circumferential Edema Measurements - multi extremities _3  - 0 Nutritional Assessment / Counseling / Intervention [name], [name] ([id number]) [id number] Page 2 of 7 _4  - 0 Lower Extremity Assessment (monofilament, tuning fork, pulses) _5  - 0 Peripheral Arterial Disease Assessment (using hand held doppler) ASSESSMENTS - Ostomy and/or Continence Assessment and Care _6  - 0 Incontinence Assessment and Management _7  - 0 Ostomy Care Assessment and Management (repouching, etc.) PROCESS - Coordination of Care X - Simple Patient / Family Education for ongoing care 1 15 _8  - 0 Complex (extensive) Patient / Family Education for ongoing care X- 1 10 Staff obtains Programmer, systems, Records, T Results / Process Orders est _9  - 0 Staff telephones HHA, Nursing Homes / Clarify orders / etc _10  - 0 Routine Transfer to another Facility (non-emergent condition) _11  - 0 Routine Hospital Admission (non-emergent condition) _12  - 0 New Admissions / Biomedical engineer / Ordering NPWT Apligraf, etc. , _13  - 0 Emergency Hospital Admission (emergent condition) X- 1 10 Simple Discharge Coordination _14  - 0 Complex (extensive) Discharge Coordination PROCESS - Special Needs _15  - 0 Pediatric / Minor Patient Management _16  - 0 Isolation Patient Management _17  - 0 Hearing / Language / Visual special needs _18  - 0 Assessment of Community assistance  (transportation, D/C planning, etc.) _19  - 0 Additional assistance / Altered mentation _20  - 0 Support Surface(s) Assessment (bed, cushion, seat, etc.) INTERVENTIONS - Wound Cleansing / Measurement X - Simple Wound Cleansing - one  wound 1 5 _0  - 0 Complex Wound Cleansing - multiple wounds X- 1 5 Wound Imaging (photographs - any number of wounds) _1  - 0 Wound Tracing (instead of photographs) X- 1 5 Simple Wound Measurement - one wound _2  - 0 Complex Wound Measurement - multiple wounds INTERVENTIONS - Wound Dressings X - Small Wound Dressing one or multiple wounds 1 10 _3  - 0 Medium Wound Dressing one or multiple wounds _4  - 0 Large Wound Dressing one or multiple wounds X- 1 5 Application of Medications - topical <KPQAESLPNPYYFRTM>_2<\/TRZNBVAPOLIDCVUD>_3  - 0 Application of Medications - injection INTERVENTIONS - Miscellaneous _6  - 0 External ear exam _7  - 0 Specimen Collection (cultures, biopsies, blood, body fluids, etc.) _8  - 0 Specimen(s) / Culture(s) sent or taken to Lab for analysis _9  - 0 Patient Transfer (multiple staff / Civil Service fast streamer / Similar devices) _10  - 0 Simple Staple / Suture removal (25 or less) _11  - 0 Complex Staple / Suture removal (26 or more) _12  - 0 Hypo / Hyperglycemic Management (close monitor of Blood Glucose) [name] ([id number]) [id number] Page 3 of 7 _13  - 0 Ankle / Brachial Index (ABI) - do not check if billed separately X- 1 5 Vital Signs Has the patient been seen at the hospital within the last three years: Yes Total Score: 90 Level Of Care: New/Established - Level 3 Electronic Signature(s) Signed: 08/29/2022 12:09:11 PM By: Rhae Hammock RN Entered By: Rhae Hammock on 08/27/2022 09:59:19 -------------------------------------------------------------------------------- Encounter Discharge Information Details Patient Name: Date of Service: [name], [name] 08/27/2022 9:30 A M Medical Record Number: [id number] Patient Account Number:  [id number] Date of Birth/Sex: Treating RN: 07/11/1951 (71 y.o. [name], [name] Primary Care Lorance Pickeral: [name] Other Clinician: Referring Melisa Donofrio: Treating Kallie Depolo/Extender: [name], [name]: [phone] Encounter Discharge Information Items Discharge Condition: Stable Ambulatory Status: Ambulatory Discharge Destination: Home Transportation: Private Auto Accompanied By: self Schedule Follow-up Appointment: Yes Clinical Summary of Care: Patient Declined Electronic Signature(s) Signed: 08/29/2022 12:09:11 PM By: Rhae Hammock RN Entered By: Rhae Hammock on 08/27/2022 09:56:18 -------------------------------------------------------------------------------- Lower Extremity Assessment Details Patient Name: Date of Service: [name], [name] 08/27/2022 9:30 A M Medical Record Number: [id number] Patient Account Number: [id number] Date of Birth/Sex: Treating RN: 05-10-1951 (71 y.o. F) Primary Care Akaysha Cobern: [name] Other Clinician: Referring Benjermin Korber: Treating Gilda Abboud/Extender: [name], [name]: 105 Electronic Signature(s) Signed: 08/27/2022 4:33:40 PM By: Erenest Blank Entered By: Erenest Blank on 08/27/2022 09:24:21 -------------------------------------------------------------------------------- Multi-Disciplinary Care Plan Details Patient Name: Date of Service: [place] INS, [place] [name] 08/27/2022 9:30 A M Medical Record Number: [id number] Patient Account Number: [id number] [name], [name] ([id number]) [id number] Page 4 of 7 Date of Birth/Sex: Treating RN: August 25, 1951 (71 y.o. [name], [name] Primary Care Marian Meneely: Other Clinician: [name] Referring Tayloranne Lekas: Treating Seletha Zimmermann/Extender: [name], [name] in Treatment: [place] reviewed with physician Active Inactive Wound/Skin Impairment Nursing  Diagnoses: Impaired tissue integrity Knowledge deficit related to ulceration/compromised skin integrity Goals: Patient/caregiver will verbalize understanding of skin care regimen Date Initiated: 08/22/2020 Target Resolution Date: 10/10/2022 Goal Status: Active Ulcer/skin breakdown will have a volume reduction of 30% by week 4 Date Initiated: 08/22/2020 Date Inactivated: 10/03/2020 Target Resolution Date: 09/19/2020 Goal Status: Met Ulcer/skin breakdown will have a volume reduction of 50% by week 8 Date Initiated: 06/05/2021 Date Inactivated: 07/17/2021 Target Resolution Date: 07/03/2021 Goal Status: Unmet Unmet Reason: infection Interventions: Assess patient/caregiver ability to obtain necessary supplies Assess patient/caregiver ability to perform ulcer/skin care regimen  upon admission and as needed Assess ulceration(s) every visit Treatment Activities: Skin care regimen initiated : 08/22/2020 Topical wound management initiated : 08/22/2020 Notes: 06/05/21: Wound care regimen ongoing. 06/11/22: Wound care regimen continues, applying skin sub ([place]) Electronic Signature(s) Signed: 08/29/2022 12:09:11 PM By: Rhae Hammock RN Entered By: Rhae Hammock on 08/27/2022 09:48:02 -------------------------------------------------------------------------------- Pain Assessment Details Patient Name: Date of Service: [name], [name] 08/27/2022 9:30 A M Medical Record Number: [id number] Patient Account Number: [id number] Date of Birth/Sex: Treating RN: [birth year] (71 y.o. F) Primary Care Goebel Hellums: [name] Other Clinician: Referring Olene Godfrey: Treating Gricelda Foland/Extender: [name], [name]: [phone] Active Problems Location of Pain Severity and Description of Pain Patient Has Paino No Site Locations [place], [place] ([id number]) [id number] Page 5 of 7 Pain Management and Medication Current Pain Management: Electronic  Signature(s) Signed: 08/27/2022 4:33:40 PM By: Erenest Blank Entered By: Erenest Blank on 08/27/2022 09:24:13 -------------------------------------------------------------------------------- Patient/Caregiver Education Details Patient Name: Date of Service: [name], [name] 11/15/2023andnbsp9:30 A M Medical Record Number: [id number] Patient Account Number: [id number] Date of Birth/Gender: Treating RN: [birth year] (71 y.o. Benjaman Lobe Primary Care Physician: [name] Other Clinician: Referring Physician: Treating Physician/Extender: [name], [name] in Treatment: [phone] Education Assessment Education Provided To: Patient Education Topics Provided Wound/Skin Impairment: Methods: Explain/Verbal Responses: Reinforcements needed, State content correctly [employer]) Signed: 08/29/2022 12:09:11 PM By: Rhae Hammock RN Entered By: Rhae Hammock on 08/27/2022 09:48:16 -------------------------------------------------------------------------------- Wound Assessment Details Patient Name: Date of Service: [name], [name] 08/27/2022 9:30 A M Medical Record Number: [id number] Patient Account Number: [id number] Date of Birth/Sex: Treating RN: 1951-04-23 (72 y.o. F) Primary Care Rosamund Nyland: [name] Other Clinician: Referring Brodyn Depuy: Treating Marcel Sorter/Extender: [name], [place], [place] ([id number]) [id number] Page 6 of 7 Weeks in Treatment: 105 Wound Status Wound Number: 1 Primary Soft Tissue Radionecrosis Etiology: Wound Location: Right Breast (mastectomy site) Wound Open Wounding Event: Radiation Burn Status: Date Acquired: 07/26/2020 Comorbid Anemia, Lymphedema, Deep Vein Thrombosis, Hypertension, Weeks Of Treatment: 105 History: Peripheral Venous Disease, Osteoarthritis, Received Clustered Wound: No Chemotherapy, Received Radiation, Confinement Anxiety Photos Wound  Measurements Length: (cm) 1.5 Width: (cm) 2.1 Depth: (cm) 0.3 Area: (cm) 2.474 Volume: (cm) 0.742 % Reduction in Area: -61.5% % Reduction in Volume: -385% Epithelialization: None Tunneling: No Undermining: No Wound Description Classification: Full Thickness Without Exposed Suppo Wound Margin: Distinct, outline attached Exudate Amount: Medium Exudate Type: Serosanguineous Exudate Color: red, brown rt Structures Foul Odor After Cleansing: No Slough/Fibrino Yes Wound Bed Granulation Amount: Large (67-100%) Exposed Structure Granulation Quality: Red, Friable Fascia Exposed: No Necrotic Amount: Small (1-33%) Fat Layer (Subcutaneous Tissue) Exposed: Yes Necrotic Quality: Adherent Slough Tendon Exposed: No Muscle Exposed: No Joint Exposed: No Bone Exposed: No Periwound Skin Texture Texture Color No Abnormalities Noted: Yes No Abnormalities Noted: No Atrophie Blanche: No Moisture Cyanosis: No No Abnormalities Noted: Yes Ecchymosis: No Erythema: No Hemosiderin Staining: No Mottled: No Pallor: No Rubor: No Temperature / Pain Temperature: No Abnormality Treatment Notes Wound #1 (Breast (mastectomy site)) Wound Laterality: Right Cleanser Wound Cleanser Discharge Instruction: Cleanse the wound with wound cleanser prior to applying a clean dressing using gauze sponges, not tissue or cotton balls. Soap and Water Discharge Instruction: May shower and wash wound with dial antibacterial soap and water prior to dressing change. [name], [name] ([id number]) [id number] Page 7 of 7 Peri-Wound Care Skin Prep Discharge Instruction: Use skin prep as directed Topical Primary Dressing Promogran Prisma Matrix, 4.34 (sq in) (silver collagen) Discharge Instruction: Moisten collagen  with saline or hydrogel Secondary Dressing ALLEVYN Gentle Border, 5x5 (in/in) Discharge Instruction: Apply over primary dressing as directed. Woven Gauze Sponges 2x2 in Discharge  Instruction: Apply over primary dressing, fold in corners to fill the space Secured With Compression Wrap Compression Stockings Add-Ons Electronic Signature(s) Signed: 08/27/2022 4:33:40 PM By: Erenest Blank Entered By: Erenest Blank on 08/27/2022 09:29:29 -------------------------------------------------------------------------------- Vitals Details Patient Name: Date of Service: [name], [name] 08/27/2022 9:30 A M Medical Record Number: [id number] Patient Account Number: [id number] Date of Birth/Sex: Treating RN: 1951/07/03 (71 y.o. F) Primary Care Ardith Test: [name] Other Clinician: Referring Tryce Surratt: Treating Shaneya Taketa/Extender: [name], [name]: 105 Vital Signs Time Taken: 09:23 Temperature (F): 97.9 Height (in): 63 Pulse (bpm): 64 Weight (lbs): 176 Respiratory Rate (breaths/min): 18 Body Mass Index (BMI): 31.2 Blood Pressure (mmHg): 145/84 Reference Range: 80 - 120 mg / dl Electronic Signature(s) Signed: 08/27/2022 4:33:40 PM By: Erenest Blank Entered By: Erenest Blank on 08/27/2022 09:24:01

## 2022-09-01 ENCOUNTER — Ambulatory Visit
Admission: RE | Admit: 2022-09-01 | Discharge: 2022-09-01 | Disposition: A | Discharge status: Home / Self Care | Payer: Medicare Other | Source: Ambulatory Visit | Attending: Family Medicine | Admitting: Family Medicine

## 2022-09-01 DIAGNOSIS — Z1231 Encounter for screening mammogram for malignant neoplasm of breast: Secondary | ICD-10-CM | POA: Diagnosis not present

## 2022-09-08 ENCOUNTER — Other Ambulatory Visit: Payer: Self-pay | Admitting: Family Medicine

## 2022-09-08 DIAGNOSIS — R928 Other abnormal and inconclusive findings on diagnostic imaging of breast: Secondary | ICD-10-CM

## 2022-09-10 ENCOUNTER — Encounter (HOSPITAL_BASED_OUTPATIENT_CLINIC_OR_DEPARTMENT_OTHER): Payer: Medicare Other | Admitting: Physician Assistant

## 2022-09-12 DIAGNOSIS — I700 Atherosclerosis of aorta: Secondary | ICD-10-CM | POA: Diagnosis not present

## 2022-09-12 DIAGNOSIS — I10 Essential (primary) hypertension: Secondary | ICD-10-CM | POA: Diagnosis not present

## 2022-09-17 ENCOUNTER — Encounter (HOSPITAL_BASED_OUTPATIENT_CLINIC_OR_DEPARTMENT_OTHER): Payer: Medicare Other | Attending: Physician Assistant | Admitting: Physician Assistant

## 2022-09-17 DIAGNOSIS — I890 Lymphedema, not elsewhere classified: Secondary | ICD-10-CM | POA: Insufficient documentation

## 2022-09-17 DIAGNOSIS — I10 Essential (primary) hypertension: Secondary | ICD-10-CM | POA: Insufficient documentation

## 2022-09-17 DIAGNOSIS — Z9011 Acquired absence of right breast and nipple: Secondary | ICD-10-CM | POA: Diagnosis not present

## 2022-09-17 DIAGNOSIS — Z853 Personal history of malignant neoplasm of breast: Secondary | ICD-10-CM | POA: Diagnosis not present

## 2022-09-17 DIAGNOSIS — L598 Other specified disorders of the skin and subcutaneous tissue related to radiation: Secondary | ICD-10-CM | POA: Insufficient documentation

## 2022-09-17 DIAGNOSIS — M1990 Unspecified osteoarthritis, unspecified site: Secondary | ICD-10-CM | POA: Diagnosis not present

## 2022-09-17 DIAGNOSIS — L98492 Non-pressure chronic ulcer of skin of other sites with fat layer exposed: Secondary | ICD-10-CM | POA: Insufficient documentation

## 2022-09-17 DIAGNOSIS — Z86718 Personal history of other venous thrombosis and embolism: Secondary | ICD-10-CM | POA: Diagnosis not present

## 2022-09-17 DIAGNOSIS — L589 Radiodermatitis, unspecified: Secondary | ICD-10-CM | POA: Insufficient documentation

## 2022-09-17 DIAGNOSIS — Z9221 Personal history of antineoplastic chemotherapy: Secondary | ICD-10-CM | POA: Insufficient documentation

## 2022-09-17 NOTE — Progress Notes (Addendum)
ATAYA, MURDY ([id number]) [id number] Page 1 of 10 Visit Report for 09/17/2022 Chief Complaint Document Details Patient Name: Date of Service: [name] [name] 09/17/2022 9:30 A M Medical Record Number: [id number] Patient Account Number: [id number] Date of Birth/Sex: Treating RN: 23-Feb-1951 (71 y.o. 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 [name] [name], [name] Primary Care Provider: [name] Other Clinician: Referring Provider: Treating Provider/Extender: [name], [name]: 108 Debridement Performed for Assessment: Wound #1 Right Breast (mastectomy site) Performed By: Physician Worthy Keeler, PA Debridement Type: Debridement Level of Consciousness (Pre-procedure): Awake and Alert Pre-procedure Verification/Time Out Yes - 09:53 Taken: Start Time: 09:53 Pain Control: Lidocaine T Area Debrided (L x W): otal 1.7 (cm) x 2.6 (cm) = 4.42 (cm) Tissue and other material debrided: Viable, Non-Viable, Slough, Subcutaneous, Slough Level: Skin/Subcutaneous Tissue Debridement Description: Excisional Instrument: Curette Bleeding: Minimum Hemostasis Achieved: Pressure End Time: 09:53 Procedural Pain: 0 Post Procedural Pain: 0 Response to Treatment: Procedure was tolerated  well Level of Consciousness (Post- Awake and Alert procedure): Post Debridement Measurements of Total Wound Length: (cm) 1.7 Width: (cm) 2.6 Depth: (cm) 0.3 Volume: (cm) 1.041 Character of Wound/Ulcer Post Debridement: Improved Post Procedure Diagnosis [name] [name], [name] [name] ([id number]) [id number] Page 2 of 10 Same as Pre-procedure Electronic Signature(s) Signed: 09/17/2022 4:11:00 PM By: Worthy Keeler PA-C Signed: 10/15/2022 5:35:40 PM By: Rhae Hammock RN Entered By: Rhae Hammock on 09/17/2022 09:53:57 -------------------------------------------------------------------------------- HPI Details Patient Name: Date of Service: [name], [name] 09/17/2022 9:30 A M Medical Record Number: [id number] Patient Account Number: [id number] Date of Birth/Sex: Treating RN: [birth year] (71 y.o. F) Primary Care Provider: [name] Other Clinician: Referring Provider: Treating Provider/Extender: [name], [name]: 108 History of Present Illness HPI Description: 08/22/2020 upon evaluation today patient actually appears to be doing somewhat poorly in regard to her mastectomy site on the right chest wall. She had a mastectomy initially in 1998. She had chemotherapy only no radiation following. In 2002 she subsequently did undergo radiation for the first time and then subsequently in 2012 had repeat radiation after having had a finding that was consistent with a relapse of the cancer. 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 She also has radiation damage to the skin over the right chest wall where she had the mastectomy. She was referred to [place] by Dr. Matthew Saras in [facility] and her oncologist is Dr. Burney Gauze. Subsequently I want to make sure before we delve into this more deeply that the patient does not have any issues with a return of cancer that needs to be managed by them.  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 Nonetheless she does not remember exactly when her last PET scan was but it was not too recently she tells me. 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  The patient does have hypertension as well. She sees her oncologist next on December 6. 09/12/2020 on evaluation today patient actually appears to be doing excellent in regard to her wound under the right breast location. Currently this is measuring smaller in general seems to be doing very well. She tells me that the collagen is doing a good job and that the dressing that she has been putting on is not causing any troubles as far pulling on her skin or scar tissue is concerned all of which is excellent news. 10/03/2020 upon evaluation today patient appears to be doing well with regard to her surgical site from her mastectomy on the right breast region. She tells me that she has had very little bleeding nothing seems to be sticking badly and in general she has been extremely pleased with where things stand today. No fevers, chills, nausea, vomiting, or diarrhea. 10/24/2020 upon evaluation today patient actually appears to be doing excellent in regard to her wound. There is just a very small area still open and to be honest there was minimal slough noted on the surface of the wound nothing that requires sharp debridement. In general I feel like she is actually doing quite well and overall I am pleased. I do think we may switch to silver alginate dressing and also contemplating using a AandE ointment in order to help keep everything moist and the scar tissue region while the alginate keeps it dry enough to heal 11/14/2020 upon evaluation today patient appears to be doing well at this point in regard to her wound. I do feel like that she is making good  progress again this is a very difficult region over the surgical site of the right chest wall at the site of mastectomy. Nonetheless I think that we are just a very small area still open I think alginate is doing a good job helping to dry this up and I would recommend this such that we continue with this. 01/02/2021 on evaluation today patient's wound actually appears to be doing decently well. There does not appear to be any signs of active infection which is great news. She does have some slight slough buildup with biofilm on the surface of the wound mainly more biofilm. Nonetheless I was able to gently remove this with saline and gauze as well as a sterile Q-tip. She tolerated that today without complication. 01/30/2021 upon evaluation today patient appears to be doing well with regard to her wound although she still has quite a bit of trouble getting this to close I think the scar tissue and radiation damage is quite significant here unfortunately. There does not appear to be any signs of active infection which is great news. No fevers, chills, nausea, vomiting, or diarrhea. 02/27/2021 upon evaluation today patient appears to be doing excellent in regard to her wound. She has been tolerating the dressing changes without complication and in general I am extremely pleased with where things stand today. There does not appear to be any signs of infection which is great news. No fevers, chills, nausea, vomiting, or diarrhea. With that being said I do think that she still developing some slough buildup. I did discuss with her today the possibility of proceeding with HBO therapy. We have had this discussion before but she really is not quite committed to wanting to do that much and spend that much time in the chamber. She wants to still think about it. 03/27/2021 upon evaluation today patient appears to be doing about the  same in regard to her wound. She still developing a lot of slough buildup on the  surface of the wound. I did try to clean that away to some degree today. She tolerated that without any pain or complication. Subsequently I am thinking we may want to switch over to [facility] see if this may do better for her. Patient is in agreement with giving that a trial. 05/01/2021 upon evaluation today patient appears to be doing about the same in regard to her wounds. She has been tolerating the dressing changes without complication. Fortunately there does not appear to be any signs of active infection at this time which is great news. No fevers, chills, nausea, vomiting, or diarrhea.. 06/05/2021 upon evaluation today patient appears to be doing pretty well currently in regard to her wound at the mastectomy site right chest wall. Overall since we switch back to the alginate she tells me things have significantly improved she is much happier with where things stand currently. Fortunately there does not appear to be any signs of active infection which is great news. No fevers, chills, nausea, vomiting, or diarrhea. 07/10/2021 upon evaluation today patient unfortunately does not appear to be doing nearly as well as what she previously was. There does not appear to be any evidence of new epithelial growth she has a lot of necrotic tissue the base of the wound this is much more significant than what we previously noted. She also has been having increased pain and increased drainage. In general I am very concerned about this especially in light of the fact that she does have a history of breast cancer I want to ensure that were not looking at any cancerous type lesion at this point. Otherwise also think she does need some debridement we did do MolecuLight scanning today. [name] [name], [name] ([id number]) [id number] Page 3 of 10 07/17/2021 upon evaluation today patient appears to be doing well with regard to her wound compared to last week although she still having some erythema I  am concerned in this regard. I do think that she fortunately had a negative biopsy which is great news unfortunately she did have Pseudomonas noted as a bacteria present in the culture which I think is good and need to be addressed with Levaquin. I Minna send that into the pharmacy today. We did repeat the MolecuLight screening. 07/31/2021 upon evaluation today patient's wound is actually showing signs of improvement which is great news. Fortunately there does not appear to be any signs of infection currently locally nor systemically and I am very pleased in that regard. With that being said the patient is having some issues here with continued necrotic tissue I think packing with the Dakin's moistened gauze dressing is still in the right leg ago. 08/14/2021 upon evaluation today patient appears to be doing well with regard to her wound all things considered I do not see any signs of significant infection which is great news. Fortunately there does not appear to be any evidence of infection which is also great news. In general I think that the patient is tolerating the dressing changes well. We been using a Dakin's moistened gauze. 08/28/2021 upon evaluation today patient appears to be doing well with regard to her wound. Worsening signs of definite improvement which is great news and overall very pleased with where we stand today. There is no signs of inflamed tissue or infection which is great as well and I think the Dakin's is doing a great job here. 09/11/2021 upon evaluation  today patient appears to be doing well with regard to her wound all things considered. I am can perform some debridement today to clear away some of the necrotic debris with that being said overall I think that she is making decently good progress here which is great news. There does not appear to be any signs of active infection also good news. That in fact is probably the best news in her mind. 09/25/2021 upon evaluation  today patient appears to be doing decently well in regard to her wound. Overall I do not see any signs of infection and I think she is doing quite well. I do believe that we are making headway here although is very slow due to the scarring and the depth of the wound this is a significantly deep wound. 10/16/2021 upon evaluation today patient appears to be doing about the same in regard to the wound on her right chest wall. Fortunately there does not appear to be any signs of active infection locally nor systemically which is great news. With that being said this still was not cleaning up quite as quickly as I would like to see. Fortunately I think that we can definitely give the Santyl another try we tried this in the past and unfortunately did not have a good result but again I think she was infected at that time as well which was part of the issue. Nonetheless I think currently that we will be able to go ahead and see what we can do about getting this little cleaner with the Santyl. 11/06/2021 upon evaluation today patient appears to be doing well with regard to her wound. This actually appears to be a little bit better with the Santyl I am happy in that regard. Fortunately I do not see any signs of active infection at this time. No fevers, chills, nausea, vomiting, or diarrhea. 12/04/2021 upon evaluation patient appears to be doing well with the Santyl at this point. I am very pleased with where we stand and I think that she is making good progress here. Fortunately I do not see any evidence of active infection locally or systemically at this time which is great news. No fevers, chills, nausea, vomiting, or diarrhea. 01/01/2022 upon evaluation today patient actually is making excellent progress with the Santyl. I am extremely pleased with where we stand today. I do not see any signs of infection. 01-29-2022 upon evaluation today patient appears to be doing well with regard to her wound. This is showing  signs of some improvement. It is a little less deep than what it was. Size wise is also slightly smaller but again there still is a significant wound in this area. Fortunately I do not see any evidence of infection and she is doing quite well. 02-26-2022 upon evaluation today patient appears to be doing well with regard to her wound. In fact this is actually the best that I have seen in a very long time. I do not see any evidence of active infection at this time locally or systemically which is great news and overall I think we are on the right track. Nonetheless I do believe that the patient is continuing to show evidence of improvement with the Santyl week by week. 03-26-2022 upon evaluation today patient appears to be doing well with regard to the wound on her right mastectomy site. She is actually showing signs of excellent improvement in fact there was some bleeding just with a little bit of light cleaning over the area.  Fortunately I do not see any signs of active infection locally or systemically at this time which is great news. 04-30-2022 upon evaluation today patient appears to be doing well currently in regard to her wound. She is actually showing some signs of improvement here which is great news. Still this is very very slow. Subsequently I do believe that she may benefit from the use of [place] topical antibiotics for short amount of time before applying a skin substitute I think EpiCord could be doing very well for her as far as that is concerned. I discussed that with her today. Fortunately I do not see any evidence of active infection locally or systemically at this time which is great news. No fevers, chills, nausea, vomiting, or diarrhea. 05-28-2022 upon evaluation today patient's wound is actually showing signs of significant improvement. Fortunately I do not see any evidence of infection at this time which is great news. No fevers, chills, nausea, vomiting, or diarrhea. With that  being said I do believe that the patient is tolerating the dressing changes without complication. We been using the [facility] which I think is helpful. We do have approval for the [place]. 06-11-2022 upon evaluation today patient appears to be doing well with regard to her wound she is actually here today for the first application of [place] which I think is good to be beneficial for her. I do think that we will probably be able to keep this in place without can be the one thing of question to be honest. 06-18-2022 upon evaluation today patient actually appears to be doing excellent today. She has 1 treatment of the EpiCord underway and the wound surface already looks much better. This will be EpiCord #2 today. Fortunately I think we are on the right track here. 06-25-2022 upon evaluation today patient appears to be making good progress and overall the patient seems to have tolerated EpiCord without any complication. I do feel like that the wound is improving quite significantly. This is EpiCord #3 today. 07-02-2021 upon evaluation today patient's wound is actually showing signs of improvement. I am actually very pleased with where we stand we have been seeing some improvements in size overall and I think that we are still in the right track although there is a little bit of need for sharp debridement today to clearway some of the necrotic debris and remaining EpiCord which is actually still somewhat adhered to the wound bed. Overall I am extremely pleased though with where we stand. This is EpiCord #4 today. Upon evaluation today patient appears to be doing well with regard to her wound the EpiCord is definitely helping and does seem to be improving the overall status of the wound the surface is dramatically improved compared to what it was at the start of this. The tissue is actually becoming more red than it is just a pale pink and to be honest some of the did not even appear to be pink in the start.  Nonetheless I am very pleased with where we stand currently. No fevers, chills, nausea, vomiting, or diarrhea. 07-16-2022 upon evaluation today patient appears to be doing well currently in regard to her wound she is doing well with the [place] this is application #6 today. 07-23-2022 upon evaluation today patient's wound actually showing signs of excellent improvement which is great news. Fortunately I think were making progress again this is a very scarred area which now has a pretty good vascular supply which is great news. Overall I think that  she is making great progress. This is EpiCord #7 today 10/18; Epicort No. 8. Very minimally smaller. Wounds on the right breast mastectomy site complicated by soft tissue radiation damage. Unfortunately she lives in [place], 5 days a week transport would be impossible for her to arrange SAIDI, SANTACROCE ([id number]) [id number] Page 4 of 10 08-06-2022 upon evaluation today patient's wound is showing signs again of minimal improvement with regard to the wound bed again this is something that would probably respond well to hyperbaric oxygen therapy and I think should we can probably get this approved but she is just not able to make the transportation from [place] here daily for the 21-monthperiod of time. Nonetheless I do think that she seems to be doing much better with regard to the surface of the wound and I am pleased that regard I am going to go ahead and continue with the EOgdenthis is #9 application today. 08-13-2022 upon evaluation today patient's wound again is showing signs of improved quality to the tissue in the base of the wound. Fortunately I do not see any evidence of infection which is great news and overall I am extremely pleased in that regard. In general I do believe that the patient is making progress here. No fevers, chills, nausea, vomiting, or diarrhea. She is here today for EpiCord application ##54127/0 patient  with a wound on the lower part of the right breast area. She is apparently had a nice response to epi cord but she has completed this. It is difficult not to look over this lady's history and look at the wound and wonder about the known benefits of hyperbaric oxygen for soft tissue radionecrosis however she is unable to make transportation arrangements[place] upon evaluation today patient appears to be doing well in regard to her wound. She has been tolerating the dressing changes without complication wheezing using the silver collagen over the past week and she tolerated that without complication. Fortunately I see no evidence of active infection locally or systemically at this time. 09-17-2022 upon evaluation today patient appears to be doing well currently in regard to her wound. She has been tolerating the dressing changes without complication. Fortunately there does not appear to be any signs of active infection locally or systemically at this time which is great news. No fevers, chills, nausea, vomiting, or diarrhea. Electronic Signature(s) Signed: 09/17/2022 9:55:32 AM By: SWorthy KeelerPA-C Entered By: SWorthy Keeleron 09/17/2022 09:55:32 -------------------------------------------------------------------------------- Physical Exam Details Patient Name: Date of Service: [name], [name] 09/17/2022 9:30 A M Medical Record Number: [id number] Account Number: [id number] of Birth/Sex: Treating RN: 2November 09, 1952((71y.o. F) Primary Care Provider: HMaryella ShiversOther Clinician: Referring Provider: Treating Provider/Extender: SZenon Mayo [name]: 154Constitutional [name] and well-hydrated in no acute distress. Respiratory normal breathing without difficulty. Psychiatric this patient is able to make decisions and demonstrates good insight into disease process. Alert and Oriented x 3. pleasant and  cooperative. Notes Upon inspection patient's wound bed actually showed signs of good granulation and epithelization at this point. Fortunately I do not see any signs of active infection locally nor systemically which is great news and overall I do believe that we are headed in the right direction. Electronic Signature(s) Signed: 09/17/2022 9:55:47 AM By: SWorthy KeelerPA-C Entered By: SWorthy Keeleron 09/17/2022 09:55:47 -------------------------------------------------------------------------------- Physician Orders Details Patient Name: Date of Service: [name], [name] 09/17/2022 9:30 A M  Medical Record Number: [id number] Patient Account Number: [id number] Date of Birth/Sex: Treating RN: [birth year] (72 y.o. [name] [name], [name] Primary Care Provider: [name] Other Clinician: Referring Provider: Treating Provider/Extender: [name], [name] in Treatment: [address] / Phone Orders: No [name] ([id number]) [id number] Page 5 of 10 Diagnosis Coding ICD-10 Coding Code Description L98.492 Non-pressure chronic ulcer of skin of other sites with fat layer exposed L58.9 Radiodermatitis, unspecified L59.8 Other specified disorders of the skin and subcutaneous tissue related to radiation I10 Essential (primary) hypertension Follow-up Appointments Return appointment in 1 month. Burman Blacksmith, PA on Wednesday's Anesthetic (In clinic) Topical Lidocaine 5% applied to wound bed Cellular or Tissue Based Products Cellular or Tissue Based Product Type: - Order Epicord for application on 10/18/24=948% covered daptic or Mepitel. (DO NOT REMOVE). - Cellular or Tissue Based Product applied to wound bed, secured with steri-strips, cover with A Epicord #1 06/11/22, #2 06/18/22, #3 06/25/22, #4 07/02/22, #5 07/09/22, #6 07/16/22, Epicord #7 07/23/22, Epicord #8 07/30/22, Epicord #9 08/06/2022. Epicord # 10 08/13/22 - no more after 54OE application, per PA  we will see how she does after this with collagen for now. Bathing/ Shower/ Hygiene May shower with protection but do not get wound dressing(s) wet. Additional Orders / Instructions Follow Nutritious Diet Wound Treatment Wound #1 - Breast (mastectomy site) Wound Laterality: Right Cleanser: Wound Cleanser (DME) (Generic) 1 x Per Day/30 Days Discharge Instructions: Cleanse the wound with wound cleanser prior to applying a clean dressing using gauze sponges, not tissue or cotton balls. Cleanser: Soap and Water 1 x Per Day/30 Days Discharge Instructions: May shower and wash wound with dial antibacterial soap and water prior to dressing change. Peri-Wound Care: Skin Prep (DME) (Generic) 1 x Per Day/30 Days Discharge Instructions: Use skin prep as directed Prim Dressing: Promogran Prisma Matrix, 4.34 (sq in) (silver collagen) (DME) (Dispense As Written) 1 x Per Day/30 Days ary Discharge Instructions: Moisten collagen with saline or hydrogel Secondary Dressing: ALLEVYN Gentle Border, 5x5 (in/in) (DME) (Dispense As Written) 1 x Per Day/30 Days Discharge Instructions: Apply over primary dressing as directed. Secondary Dressing: Woven Gauze Sponges 2x2 in (DME) (Generic) 1 x Per Day/30 Days Discharge Instructions: Apply over primary dressing, fold in corners to fill the space Electronic Signature(s) Signed: 09/17/2022 4:11:00 PM By: Worthy Keeler PA-C Signed: 10/15/2022 5:35:40 PM By: Rhae Hammock RN Entered By: Rhae Hammock on 09/17/2022 09:54:43 -------------------------------------------------------------------------------- Problem List Details Patient Name: Date of Service: [name], [name] 09/17/2022 9:30 A M Medical Record Number: [id number] Patient Account Number: [id number] Date of Birth/Sex: Treating RN: [birth year] (71 y.o. F) Primary Care Provider: [name] Other Clinician: Referring Provider: Treating Provider/Extender: [name], [name] in  Treatment: [address] Active Problems ICD-10 Encounter [name] [name], [name] ([id number]) [id number] Page 6 of 10 Encounter Code Description Active Date MDM Diagnosis L98.492 Non-pressure chronic ulcer of skin of other sites with fat layer exposed 08/22/2020 No Yes L58.9 Radiodermatitis, unspecified 08/22/2020 No Yes L59.8 Other specified disorders of the skin and subcutaneous tissue related to 08/22/2020 No Yes radiation I10 Essential (primary) hypertension 08/22/2020 No Yes Inactive Problems Resolved Problems Electronic Signature(s) Signed: 09/17/2022 9:28:17 AM By: Worthy Keeler PA-C Entered By: Worthy Keeler on 09/17/2022 09:28:17 -------------------------------------------------------------------------------- Progress Note Details Patient Name: Date of Service: [name], [name] 09/17/2022 9:30 A M Medical Record Number: [id number] Patient Account Number: [id number] Date of Birth/Sex: Treating RN: 1951-02-20 (71 y.o. 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 She had a mastectomy initially in 1998. She had chemotherapy only no radiation following. In 2002 she subsequently did undergo radiation for the first time and then subsequently in 2012 had repeat radiation after having had a finding that was consistent with a relapse of the cancer. 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 She also has radiation damage to the skin over the right chest wall where she had the mastectomy. She was referred to [place] by Dr.  Matthew Saras in [facility] and her oncologist is Dr. Burney Gauze. Subsequently I want to make sure before we delve into this more deeply that the patient does not have any issues with a return of cancer that needs to be managed by them. 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 Nonetheless she does not remember exactly when her last PET scan was but it was not too recently she tells me. 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 The patient does have hypertension as well. She sees her oncologist next on December 6. 09/12/2020 on evaluation today patient actually appears to be doing excellent in regard to her wound under the right breast location. Currently this is measuring smaller in general seems to be doing very well. She tells me that the collagen is doing a good job and that the dressing that she has been putting on is not causing any troubles as far pulling on her skin or scar tissue is concerned all of which is excellent news. 10/03/2020 upon evaluation today patient appears to be doing well with regard to her surgical site from her mastectomy on the right breast region. She tells me that she has had very little bleeding nothing seems to be sticking badly and in general she has been extremely pleased with where things stand today. No fevers, chills, nausea, vomiting, or diarrhea. 10/24/2020 upon evaluation today patient actually appears to be doing excellent in regard to her wound. There is just a very small area still open and to be honest there was minimal slough noted on the surface of the wound nothing that requires sharp debridement. In general I feel like she is actually doing quite well and overall I am pleased. I do think we may switch to silver alginate dressing and also contemplating using a AandE ointment in order to  help keep everything moist and the scar tissue region while the alginate keeps it dry enough to heal 11/14/2020 upon evaluation today patient appears to be doing well at this point in regard to her wound. I do feel like that she is making good progress again this is a very difficult region over the surgical site of the right chest wall at the site of mastectomy. Nonetheless I think that we are just a very small area still open I think alginate is doing a good job helping to dry this up and I would recommend this such that we continue with this. 01/02/2021 on evaluation today patient's wound actually appears to be doing decently  well. There does not appear to be any signs of active infection which is [name] [name], [name] [name] ([id number]) [id number] Page 7 of 10 great news. She does have some slight slough buildup with biofilm on the surface of the wound mainly more biofilm. Nonetheless I was able to gently remove this with saline and gauze as well as a sterile Q-tip. She tolerated that today without complication. 01/30/2021 upon evaluation today patient appears to be doing well with regard to her wound although she still has quite a bit of trouble getting this to close I think the scar tissue and radiation damage is quite significant here unfortunately. There does not appear to be any signs of active infection which is great news. No fevers, chills, nausea, vomiting, or diarrhea. 02/27/2021 upon evaluation today patient appears to be doing excellent in regard to her wound. She has been tolerating the dressing changes without complication and in general I am extremely pleased with where things stand today. There does not appear to be any signs of infection which is great news. No fevers, chills, nausea, vomiting, or diarrhea. With that being said I do think that she still developing some slough buildup. I did discuss with her today the possibility of proceeding with HBO therapy. We have had  this discussion before but she really is not quite committed to wanting to do that much and spend that much time in the chamber. She wants to still think about it. 03/27/2021 upon evaluation today patient appears to be doing about the same in regard to her wound. She still developing a lot of slough buildup on the surface of the wound. I did try to clean that away to some degree today. She tolerated that without any pain or complication. Subsequently I am thinking we may want to switch over to [facility] see if this may do better for her. Patient is in agreement with giving that a trial. 05/01/2021 upon evaluation today patient appears to be doing about the same in regard to her wounds. She has been tolerating the dressing changes without complication. Fortunately there does not appear to be any signs of active infection at this time which is great news. No fevers, chills, nausea, vomiting, or diarrhea.. 06/05/2021 upon evaluation today patient appears to be doing pretty well currently in regard to her wound at the mastectomy site right chest wall. Overall since we switch back to the alginate she tells me things have significantly improved she is much happier with where things stand currently. Fortunately there does not appear to be any signs of active infection which is great news. No fevers, chills, nausea, vomiting, or diarrhea. 07/10/2021 upon evaluation today patient unfortunately does not appear to be doing nearly as well as what she previously was. There does not appear to be any evidence of new epithelial growth she has a lot of necrotic tissue the base of the wound this is much more significant than what we previously noted. She also has been having increased pain and increased drainage. In general I am very concerned about this especially in light of the fact that she does have a history of breast cancer I want to ensure that were not looking at any cancerous type lesion at this point. Otherwise also  think she does need some debridement we did do MolecuLight scanning today. 07/17/2021 upon evaluation today patient appears to be doing well with regard to her wound compared to last week although she still having some erythema I am concerned in this regard. I  do think that she fortunately had a negative biopsy which is great news unfortunately she did have Pseudomonas noted as a bacteria present in the culture which I think is good and need to be addressed with Levaquin. I Minna send that into the pharmacy today. We did repeat the MolecuLight screening. 07/31/2021 upon evaluation today patient's wound is actually showing signs of improvement which is great news. Fortunately there does not appear to be any signs of infection currently locally nor systemically and I am very pleased in that regard. With that being said the patient is having some issues here with continued necrotic tissue I think packing with the Dakin's moistened gauze dressing is still in the right leg ago. 08/14/2021 upon evaluation today patient appears to be doing well with regard to her wound all things considered I do not see any signs of significant infection which is great news. Fortunately there does not appear to be any evidence of infection which is also great news. In general I think that the patient is tolerating the dressing changes well. We been using a Dakin's moistened gauze. 08/28/2021 upon evaluation today patient appears to be doing well with regard to her wound. Worsening signs of definite improvement which is great news and overall very pleased with where we stand today. There is no signs of inflamed tissue or infection which is great as well and I think the Dakin's is doing a great job here. 09/11/2021 upon evaluation today patient appears to be doing well with regard to her wound all things considered. I am can perform some debridement today to clear away some of the necrotic debris with that being said overall I  think that she is making decently good progress here which is great news. There does not appear to be any signs of active infection also good news. That in fact is probably the best news in her mind. 09/25/2021 upon evaluation today patient appears to be doing decently well in regard to her wound. Overall I do not see any signs of infection and I think she is doing quite well. I do believe that we are making headway here although is very slow due to the scarring and the depth of the wound this is a significantly deep wound. 10/16/2021 upon evaluation today patient appears to be doing about the same in regard to the wound on her right chest wall. Fortunately there does not appear to be any signs of active infection locally nor systemically which is great news. With that being said this still was not cleaning up quite as quickly as I would like to see. Fortunately I think that we can definitely give the Santyl another try we tried this in the past and unfortunately did not have a good result but again I think she was infected at that time as well which was part of the issue. Nonetheless I think currently that we will be able to go ahead and see what we can do about getting this little cleaner with the Santyl. 11/06/2021 upon evaluation today patient appears to be doing well with regard to her wound. This actually appears to be a little bit better with the Santyl I am happy in that regard. Fortunately I do not see any signs of active infection at this time. No fevers, chills, nausea, vomiting, or diarrhea. 12/04/2021 upon evaluation patient appears to be doing well with the Santyl at this point. I am very pleased with where we stand and I think that she is making  good progress here. Fortunately I do not see any evidence of active infection locally or systemically at this time which is great news. No fevers, chills, nausea, vomiting, or diarrhea. 01/01/2022 upon evaluation today patient actually is making  excellent progress with the Santyl. I am extremely pleased with where we stand today. I do not see any signs of infection. 01-29-2022 upon evaluation today patient appears to be doing well with regard to her wound. This is showing signs of some improvement. It is a little less deep than what it was. Size wise is also slightly smaller but again there still is a significant wound in this area. Fortunately I do not see any evidence of infection and she is doing quite well. 02-26-2022 upon evaluation today patient appears to be doing well with regard to her wound. In fact this is actually the best that I have seen in a very long time. I do not see any evidence of active infection at this time locally or systemically which is great news and overall I think we are on the right track. Nonetheless I do believe that the patient is continuing to show evidence of improvement with the Santyl week by week. 03-26-2022 upon evaluation today patient appears to be doing well with regard to the wound on her right mastectomy site. She is actually showing signs of excellent improvement in fact there was some bleeding just with a little bit of light cleaning over the area. Fortunately I do not see any signs of active infection locally or systemically at this time which is great news. 04-30-2022 upon evaluation today patient appears to be doing well currently in regard to her wound. She is actually showing some signs of improvement here which is great news. Still this is very very slow. Subsequently I do believe that she may benefit from the use of [place] topical antibiotics for short amount of time before applying a skin substitute I think EpiCord could be doing very well for her as far as that is concerned. I discussed that with her today. Fortunately I do not see any evidence of active infection locally or systemically at this time which is great news. No fevers, chills, nausea, vomiting, or diarrhea. 05-28-2022 upon  evaluation today patient's wound is actually showing signs of significant improvement. Fortunately I do not see any evidence of infection at this time which is great news. No fevers, chills, nausea, vomiting, or diarrhea. With that being said I do believe that the patient is tolerating the dressing [name], [name] [name] ([id number]) [id number] Page 8 of 10 changes without complication. We been using the [facility] which I think is helpful. We do have approval for the [place]. 06-11-2022 upon evaluation today patient appears to be doing well with regard to her wound she is actually here today for the first application of [place] which I think is good to be beneficial for her. I do think that we will probably be able to keep this in place without can be the one thing of question to be honest. 06-18-2022 upon evaluation today patient actually appears to be doing excellent today. She has 1 treatment of the EpiCord underway and the wound surface already looks much better. This will be EpiCord #2 today. Fortunately I think we are on the right track here. 06-25-2022 upon evaluation today patient appears to be making good progress and overall the patient seems to have tolerated EpiCord without any complication. I do feel like that the wound is improving quite significantly. This is EpiCord #  3 today. 07-02-2021 upon evaluation today patient's wound is actually showing signs of improvement. I am actually very pleased with where we stand we have been seeing some improvements in size overall and I think that we are still in the right track although there is a little bit of need for sharp debridement today to clearway some of the necrotic debris and remaining EpiCord which is actually still somewhat adhered to the wound bed. Overall I am extremely pleased though with where we stand. This is EpiCord #4 today. Upon evaluation today patient appears to be doing well with regard to her wound the EpiCord is  definitely helping and does seem to be improving the overall status of the wound the surface is dramatically improved compared to what it was at the start of this. The tissue is actually becoming more red than it is just a pale pink and to be honest some of the did not even appear to be pink in the start. Nonetheless I am very pleased with where we stand currently. No fevers, chills, nausea, vomiting, or diarrhea. 07-16-2022 upon evaluation today patient appears to be doing well currently in regard to her wound she is doing well with the [place] this is application #6 today. 07-23-2022 upon evaluation today patient's wound actually showing signs of excellent improvement which is great news. Fortunately I think were making progress again this is a very scarred area which now has a pretty good vascular supply which is great news. Overall I think that she is making great progress. This is EpiCord #7 today 10/18; Epicort No. 8. Very minimally smaller. Wounds on the right breast mastectomy site complicated by soft tissue radiation damage. Unfortunately she lives in [place], 5 days a week transport would be impossible for her to arrange 08-06-2022 upon evaluation today patient's wound is showing signs again of minimal improvement with regard to the wound bed again this is something that would probably respond well to hyperbaric oxygen therapy and I think should we can probably get this approved but she is just not able to make the transportation from [place] here daily for the 15-monthperiod of time. Nonetheless I do think that she seems to be doing much better with regard to the surface of the wound and I am pleased that regard I am going to go ahead and continue with the ESheridanthis is #9 application today. 08-13-2022 upon evaluation today patient's wound again is showing signs of improved quality to the tissue in the base of the wound. Fortunately I do not see any evidence of infection which is great  news and overall I am extremely pleased in that regard. In general I do believe that the patient is making progress here. No fevers, chills, nausea, vomiting, or diarrhea. She is here today for EpiCord application ##28141/3 patient with a wound on the lower part of the right breast area. She is apparently had a nice response to epi cord but she has completed this. It is difficult not to look over this lady's history and look at the wound and wonder about the known benefits of hyperbaric oxygen for soft tissue radionecrosis however she is unable to make transportation arrangements. [place] upon evaluation today patient appears to be doing well in regard to her wound. She has been tolerating the dressing changes without complication wheezing using the silver collagen over the past week and she tolerated that without complication. Fortunately I see no evidence of active infection locally or systemically  at this time. 09-17-2022 upon evaluation today patient appears to be doing well currently in regard to her wound. She has been tolerating the dressing changes without complication. Fortunately there does not appear to be any signs of active infection locally or systemically at this time which is great news. No fevers, chills, nausea, vomiting, or diarrhea. Objective Constitutional [name] and well-hydrated in no acute distress. Vitals Time Taken: 9:21 AM, Height: 63 in, Weight: 176 lbs, BMI: 31.2, Temperature: 97.9 F, Pulse: 73 bpm, Respiratory Rate: 18 breaths/min, Blood Pressure: 165/76 mmHg. Respiratory normal breathing without difficulty. Psychiatric this patient is able to make decisions and demonstrates good insight into disease process. Alert and Oriented x 3. pleasant and cooperative. General Notes: Upon inspection patient's wound bed actually showed signs of good granulation and epithelization at this point. Fortunately I do not see any signs of active  infection locally nor systemically which is great news and overall I do believe that we are headed in the right direction. Integumentary (Hair, Skin) Wound #1 status is Open. Original cause of wound was Radiation Burn. The date acquired was: 07/26/2020. The wound has been in treatment 108 weeks. The wound is located on the Right Breast (mastectomy site). The wound measures 1.7cm length x 2.6cm width x 0.3cm depth; 3.471cm^2 area and 1.041cm^3 volume. There is Fat Layer (Subcutaneous Tissue) exposed. There is no tunneling or undermining noted. There is a medium amount of serosanguineous drainage noted. The wound margin is distinct with the outline attached to the wound base. There is large (67-100%) red, friable granulation within the wound bed. There is a small (1-33%) amount of necrotic tissue within the wound bed including Adherent Slough. The periwound skin appearance had no abnormalities noted for texture. The periwound skin appearance had no abnormalities noted for moisture. The periwound skin appearance did not exhibit: Atrophie Blanche, Cyanosis, Ecchymosis, Hemosiderin Staining, Mottled, Pallor, Rubor, Erythema. Periwound temperature was noted as No Abnormality. [name] [name], [name] [name] ([id number]) [id number] Page 9 of 10 Assessment Active Problems ICD-10 Non-pressure chronic ulcer of skin of other sites with fat layer exposed Radiodermatitis, unspecified Other specified disorders of the skin and subcutaneous tissue related to radiation Essential (primary) hypertension Procedures Wound #1 Pre-procedure diagnosis of Wound #1 is a Soft Tissue Radionecrosis located on the Right Breast (mastectomy site) . There was a Excisional Skin/Subcutaneous Tissue Debridement with a total area of 4.42 sq cm performed by Worthy Keeler, PA. With the following instrument(s): Curette to remove Viable and Non-Viable tissue/material. Material removed includes Subcutaneous Tissue and Slough  and after achieving pain control using Lidocaine. No specimens were taken. A time out was conducted at 09:53, prior to the start of the procedure. A Minimum amount of bleeding was controlled with Pressure. The procedure was tolerated well with a pain level of 0 throughout and a pain level of 0 following the procedure. Post Debridement Measurements: 1.7cm length x 2.6cm width x 0.3cm depth; 1.041cm^3 volume. Character of Wound/Ulcer Post Debridement is improved. Post procedure Diagnosis Wound #1: Same as Pre-Procedure Plan Follow-up Appointments: Return appointment in 1 month. Burman Blacksmith, PA on Wednesday's Anesthetic: (In clinic) Topical Lidocaine 5% applied to wound bed Cellular or Tissue Based Products: Cellular or Tissue Based Product Type: - Order Epicord for application on 06/05/22=536% covered Cellular or Tissue Based Product applied to wound bed, secured with steri-strips, cover with Adaptic or Mepitel. (DO NOT REMOVE). - Epicord #1 06/11/22, #2 06/18/22, #3 06/25/22, #4 07/02/22, #5 07/09/22, #6 07/16/22, Epicord #7 07/23/22, Epicord #8 07/30/22, Epicord #  9 08/06/2022. Epicord # 10 08/13/22 - no more after 18EX application, per PA we will see how she does after this with collagen for now. Bathing/ Shower/ Hygiene: May shower with protection but do not get wound dressing(s) wet. Additional Orders / Instructions: Follow Nutritious Diet WOUND #1: - Breast (mastectomy site) Wound Laterality: Right Cleanser: Wound Cleanser (DME) (Generic) 1 x Per Day/30 Days Discharge Instructions: Cleanse the wound with wound cleanser prior to applying a clean dressing using gauze sponges, not tissue or cotton balls. Cleanser: Soap and Water 1 x Per Day/30 Days Discharge Instructions: May shower and wash wound with dial antibacterial soap and water prior to dressing change. Peri-Wound Care: Skin Prep (DME) (Generic) 1 x Per Day/30 Days Discharge Instructions: Use skin prep as directed Prim Dressing: Promogran  Prisma Matrix, 4.34 (sq in) (silver collagen) (DME) (Dispense As Written) 1 x Per Day/30 Days ary Discharge Instructions: Moisten collagen with saline or hydrogel Secondary Dressing: ALLEVYN Gentle Border, 5x5 (in/in) (DME) (Dispense As Written) 1 x Per Day/30 Days Discharge Instructions: Apply over primary dressing as directed. Secondary Dressing: Woven Gauze Sponges 2x2 in (DME) (Generic) 1 x Per Day/30 Days Discharge Instructions: Apply over primary dressing, fold in corners to fill the space 1. I am going to suggest that the patient continue to monitor and elevate for any signs of infection or worsening in general. Obviously based on what I am seeing I do believe that she is doing well with the collagen at this point. I think that we are making some progress although I do believe the Prisma would be better than what she currently has willing to order the Prisma brand for her at this time. 2. I am also can recommend that we have the patient continue with the Allevyn to cover. 3. We will also continue to monitor for any signs of infection if anything changes she should contact the office and let me know. We will see patient back for reevaluation in 1 Month here in the clinic. If anything worsens or changes patient will contact our office for additional recommendations. Electronic Signature(s) Signed: 09/17/2022 9:56:35 AM By: Worthy Keeler PA-C Entered By: Worthy Keeler on 09/17/2022 09:56:35 [name] ([id number]) [id number] Page 10 of 10 -------------------------------------------------------------------------------- SuperBill Details Patient Name: Date of Service: [name], [name] 09/17/2022 Medical Record Number: [id number] Patient Account Number: [id number] Date of Birth/Sex: Treating RN: 04/20/1951 (71 y.o. [name] [name], [name] Primary Care Provider: [name] Other Clinician: Referring Provider: Treating Provider/Extender: [name], [name]: 108 Diagnosis Coding ICD-10 Codes Code Description [phone] Non-pressure chronic ulcer of skin of other sites with fat layer exposed L58.9 Radiodermatitis, unspecified L59.8 Other specified disorders of the skin and subcutaneous tissue related to radiation I10 Essential (primary) hypertension Facility Procedures : The patient participates with Medicare or their insurance follows the Medicare Facility Guidelines: CPT4 Code Description Modifier Quantity [id number] [place] TISSUE 20 SQ CM/< 1 ICD-10 Diagnosis Description L98.492 Non-pressure chronic ulcer of  skin of other sites with fat layer exposed Physician Procedures : CPT4 Code Description Modifier [id number] - WC PHYS SUBQ TISS 20 SQ CM ICD-10 Diagnosis Description L98.492 Non-pressure chronic ulcer of skin of other sites with fat layer exposed Quantity: 1 Electronic Signature(s) Signed: 09/17/2022 9:56:48 AM By: Worthy Keeler PA-C Entered By: Worthy Keeler on 09/17/2022 09:56:48

## 2022-09-22 ENCOUNTER — Ambulatory Visit
Admission: RE | Admit: 2022-09-22 | Discharge: 2022-09-22 | Disposition: A | Discharge status: Home / Self Care | Payer: Medicare Other | Source: Ambulatory Visit | Attending: Family Medicine | Admitting: Family Medicine

## 2022-09-22 DIAGNOSIS — R928 Other abnormal and inconclusive findings on diagnostic imaging of breast: Secondary | ICD-10-CM

## 2022-09-22 DIAGNOSIS — N6012 Diffuse cystic mastopathy of left breast: Secondary | ICD-10-CM | POA: Diagnosis not present

## 2022-09-23 ENCOUNTER — Inpatient Hospital Stay: Payer: Medicare Other | Attending: Hematology & Oncology

## 2022-09-23 ENCOUNTER — Inpatient Hospital Stay (HOSPITAL_BASED_OUTPATIENT_CLINIC_OR_DEPARTMENT_OTHER): Payer: Medicare Other | Admitting: Family

## 2022-09-23 ENCOUNTER — Encounter: Payer: Self-pay | Admitting: Family

## 2022-09-23 ENCOUNTER — Inpatient Hospital Stay: Payer: Medicare Other

## 2022-09-23 VITALS — BP 150/69 | HR 77 | Temp 97.9°F | Resp 18 | Wt 194.1 lb

## 2022-09-23 DIAGNOSIS — Z170 Estrogen receptor positive status [ER+]: Secondary | ICD-10-CM | POA: Insufficient documentation

## 2022-09-23 DIAGNOSIS — C50011 Malignant neoplasm of nipple and areola, right female breast: Secondary | ICD-10-CM

## 2022-09-23 DIAGNOSIS — Z7982 Long term (current) use of aspirin: Secondary | ICD-10-CM | POA: Diagnosis not present

## 2022-09-23 DIAGNOSIS — Z79899 Other long term (current) drug therapy: Secondary | ICD-10-CM | POA: Diagnosis not present

## 2022-09-23 DIAGNOSIS — D509 Iron deficiency anemia, unspecified: Secondary | ICD-10-CM | POA: Insufficient documentation

## 2022-09-23 DIAGNOSIS — D508 Other iron deficiency anemias: Secondary | ICD-10-CM

## 2022-09-23 DIAGNOSIS — D631 Anemia in chronic kidney disease: Secondary | ICD-10-CM

## 2022-09-23 DIAGNOSIS — Z79811 Long term (current) use of aromatase inhibitors: Secondary | ICD-10-CM | POA: Insufficient documentation

## 2022-09-23 DIAGNOSIS — I890 Lymphedema, not elsewhere classified: Secondary | ICD-10-CM | POA: Insufficient documentation

## 2022-09-23 DIAGNOSIS — C50911 Malignant neoplasm of unspecified site of right female breast: Secondary | ICD-10-CM | POA: Diagnosis not present

## 2022-09-23 LAB — CMP (CANCER CENTER ONLY)
ALT: 12 U/L (ref 0–44)
AST: 14 U/L — ABNORMAL LOW (ref 15–41)
Albumin: 4.2 g/dL (ref 3.5–5.0)
Alkaline Phosphatase: 68 U/L (ref 38–126)
Anion gap: 6 (ref 5–15)
BUN: 19 mg/dL (ref 8–23)
CO2: 32 mmol/L (ref 22–32)
Calcium: 9.2 mg/dL (ref 8.9–10.3)
Chloride: 105 mmol/L (ref 98–111)
Creatinine: 0.58 mg/dL (ref 0.44–1.00)
GFR, Estimated: >60 mL/min (ref >60)
Glucose, Bld: 104 mg/dL — ABNORMAL HIGH (ref 70–99)
Potassium: 3.6 mmol/L (ref 3.5–5.1)
Sodium: 143 mmol/L (ref 135–145)
Total Bilirubin: 0.5 mg/dL (ref 0.3–1.2)
Total Protein: 7.5 g/dL (ref 6.5–8.1)

## 2022-09-23 LAB — CBC WITH DIFFERENTIAL/PLATELET
Abs Immature Granulocytes: 0.07 10*3/uL (ref 0.00–0.07)
Basophils Absolute: 0 10*3/uL (ref 0.0–0.1)
Basophils Relative: 0 %
Eosinophils Absolute: 0.2 10*3/uL (ref 0.0–0.5)
Eosinophils Relative: 4 %
HCT: 38.5 % (ref 36.0–46.0)
Hemoglobin: 12.1 g/dL (ref 12.0–15.0)
Immature Granulocytes: 1 %
Lymphocytes Relative: 32 %
Lymphs Abs: 1.9 10*3/uL (ref 0.7–4.0)
MCH: 28.9 pg (ref 26.0–34.0)
MCHC: 31.4 g/dL (ref 30.0–36.0)
MCV: 92.1 fL (ref 80.0–100.0)
Monocytes Absolute: 0.4 10*3/uL (ref 0.1–1.0)
Monocytes Relative: 8 %
Neutro Abs: 3.2 10*3/uL (ref 1.7–7.7)
Neutrophils Relative %: 55 %
Platelets: 195 10*3/uL (ref 150–400)
RBC: 4.18 MIL/uL (ref 3.87–5.11)
RDW: 14.1 % (ref 11.5–15.5)
WBC: 5.8 10*3/uL (ref 4.0–10.5)
nRBC: 0 % (ref 0.0–0.2)

## 2022-09-23 LAB — IRON AND IRON BINDING CAPACITY (CC-WL,HP ONLY)
Iron: 71 ug/dL (ref 28–170)
Saturation Ratios: 29 % (ref 10.4–31.8)
TIBC: 248 ug/dL — ABNORMAL LOW (ref 250–450)
UIBC: 177 ug/dL (ref 148–442)

## 2022-09-23 LAB — RETICULOCYTES
Immature Retic Fract: 9.9 % (ref 2.3–15.9)
RBC.: 4.24 MIL/uL (ref 3.87–5.11)
Retic Count, Absolute: 57.2 10*3/uL (ref 19.0–186.0)
Retic Ct Pct: 1.4 % (ref 0.4–3.1)

## 2022-09-23 LAB — FERRITIN: Ferritin: 423 ng/mL — ABNORMAL HIGH (ref 11–307)

## 2022-09-23 MED ORDER — DARBEPOETIN ALFA 300 MCG/0.6ML IJ SOSY
300.0000 ug | PREFILLED_SYRINGE | Freq: Once | INTRAMUSCULAR | Status: DC
  Filled 2022-09-23: qty 0.6

## 2022-09-23 NOTE — Progress Notes (Signed)
Hematology and Oncology Follow Up Visit  [name] [id number] 1951/08/10 71 y.o. 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 daily - restarted on 09/23/2022 Aspirin 81 mg p.o. daily IV iron as needed Aranesp 300 mcg sq for Hgb less than 11    Interim History:  [name] is here today for follow-up. Hgb is stable at 12.1, MCV 92. She had her mammogram last month and was noted to have asymmetry in the left breast. Left diagnostic mammogram and and axillary US showed this to be a benign cluster of cysts.  Her bone density scan was normal.  She has generalized aches and pains as well as swelling in her joints with RA.  She notes that her pain has not changed since stopping the Aromasin.  No fever, chills, n/v, cough, rash, dizziness, SOB, chest pain, palpitations, abdominal pain or changes in bowel or bladder habits at this time.  No blood loss, bruising or petechiae noted.  No numbness or tingling in her extremities.  No falls or syncope reported.  Appetite and hydration are good. Weight is stable at 194 lbs.   ECOG Performance Status: 1 - Symptomatic but completely ambulatory  Medications:  Allergies as of 09/23/2022       Reactions   Contrast Media [iodinated Contrast Media] Hives, Shortness Of Breath, Nausea And Vomiting   Allergic reaction even after pre-meds.   Metrizamide Hives, Shortness Of Breath, Nausea And Vomiting   Allergic reaction even after pre-meds.   Other Shortness Of Breath, Nausea And Vomiting, Rash   Allergic reaction even after pre-meds.   Dye Fdc Blue [brilliant Blue Fcf (fd&c Blue #1)] Hives, Nausea Only   Fd&c Blue #1 (brilliant Blue) Hives, Nausea Only        Medication List        Accurate as of September 23, 2022 10:25 AM. If you have any  questions, ask your nurse or doctor.          amLODipine 10 MG tablet Commonly known as: NORVASC TAKE 1 TABLET BY MOUTH DAILY   amoxicillin 500 MG capsule Commonly known as: AMOXIL Before dental procedures   aspirin EC 81 MG tablet Take 81 mg by mouth daily.   candesartan 32 MG tablet Commonly known as: ATACAND Take 32 mg by mouth daily.   celecoxib 200 MG capsule Commonly known as: CELEBREX Take 200 mg by mouth daily as needed.   exemestane 25 MG tablet Commonly known as: AROMASIN TAKE 1 TABLET BY MOUTH EVERY DAY   Gentamicin Sulfate Powd Apply 1 Application topically daily.   hydrochlorothiazide 25 MG tablet Commonly known as: HYDRODIURIL Take 25 mg by mouth daily.   IBUPROFEN PO Take 400 mg by mouth 2 (two) times daily as needed.   metoprolol tartrate 100 MG tablet Commonly known as: LOPRESSOR Take 100 mg by mouth daily.   simvastatin 20 MG tablet Commonly known as: ZOCOR Take 20 mg by mouth daily.   Vitamin D (Ergocalciferol) 1.25 MG (50000 UNIT) Caps capsule Commonly known as: DRISDOL TAKE 1 CAPSULE BY MOUTH ONE TIME PER WEEK        Allergies:  Allergies  Allergen Reactions   Contrast Media [Iodinated Contrast Media] Hives, Shortness Of Breath and Nausea And Vomiting    Allergic reaction even after pre-meds.  Metrizamide Hives, Shortness Of Breath and Nausea And Vomiting    Allergic reaction even after pre-meds.   Other Shortness Of Breath, Nausea And Vomiting and Rash    Allergic reaction even after pre-meds.   Dye Fdc Blue [Brilliant Blue Fcf (Fd&C Blue #1)] Hives and Nausea Only   Fd&C Blue #1 (Brilliant Blue) Hives and Nausea Only    Past Medical History, Surgical history, Social history, and Family History were reviewed and updated.  Review of Systems: All other 10 point review of systems is negative.   Physical Exam:  weight is 194 lb 1.9 oz (88.1 kg). Her oral temperature is 97.9 F (36.6 C). Her blood pressure is 150/69  (abnormal) and her pulse is 77. Her respiration is 18 and oxygen saturation is 100%.   Wt Readings from Last 3 Encounters:  09/23/22 194 lb 1.9 oz (88.1 kg)  07/31/22 193 lb 9.6 oz (87.8 kg)  07/24/22 193 lb 1.9 oz (87.6 kg)    Ocular: Sclerae unicteric, pupils equal, round and reactive to light Ear-nose-throat: Oropharynx clear, dentition fair Lymphatic: No cervical or supraclavicular adenopathy Lungs no rales or rhonchi, good excursion bilaterally Heart regular rate and rhythm, no murmur appreciated Abd soft, nontender, positive bowel sounds MSK no focal spinal tenderness, no joint edema Neuro: non-focal, well-oriented, appropriate affect Breasts: Deferred   Lab Results  Component Value Date   WBC 5.8 09/23/2022   HGB 12.1 09/23/2022   HCT 38.5 09/23/2022   MCV 92.1 09/23/2022   PLT 195 09/23/2022   Lab Results  Component Value Date   FERRITIN 391 (H) 07/24/2022   IRON 61 07/24/2022   TIBC 251 07/24/2022   UIBC 190 07/24/2022   IRONPCTSAT 24 07/24/2022   Lab Results  Component Value Date   RETICCTPCT 1.4 09/23/2022   RBC 4.18 09/23/2022   RETICCTABS 55.2 06/20/2011   No results found for: "KPAFRELGTCHN", "LAMBDASER", "KAPLAMBRATIO" No results found for: "IGGSERUM", "IGA", "IGMSERUM" Lab Results  Component Value Date   TOTALPROTELP 6.6 08/14/2010   ALBUMINELP 53.8 (L) 08/14/2010   A1GS 5.3 (H) 08/14/2010   A2GS 14.3 (H) 08/14/2010   BETS 5.8 08/14/2010   BETA2SER 6.3 08/14/2010   GAMS 14.5 08/14/2010   MSPIKE NOT DET 08/14/2010   SPEI * 08/14/2010     Chemistry      Component Value Date/Time   NA 144 07/24/2022 0929   NA 146 (H) 07/23/2017 0900   NA 144 02/18/2017 0901   K 4.0 07/24/2022 0929   K 3.7 07/23/2017 0900   K 3.7 02/18/2017 0901   CL 107 07/24/2022 0929   CL 107 07/23/2017 0900   CO2 30 07/24/2022 0929   CO2 31 07/23/2017 0900   CO2 27 02/18/2017 0901   BUN 21 07/24/2022 0929   BUN 16 07/23/2017 0900   BUN 17.7 02/18/2017 0901    CREATININE 0.67 07/24/2022 0929   CREATININE 0.8 07/23/2017 0900   CREATININE 0.7 02/18/2017 0901      Component Value Date/Time   CALCIUM 9.3 07/24/2022 0929   CALCIUM 9.3 07/23/2017 0900   CALCIUM 9.3 02/18/2017 0901   ALKPHOS 62 07/24/2022 0929   ALKPHOS 67 07/23/2017 0900   ALKPHOS 78 02/18/2017 0901   AST 13 (L) 07/24/2022 0929   AST 13 02/18/2017 0901   ALT 12 07/24/2022 0929   ALT 20 07/23/2017 0900   ALT 12 02/18/2017 0901   BILITOT 0.4 07/24/2022 0929   BILITOT 0.54 02/18/2017 0901       Impression and  Plan: [name] is a very pleasant 71 yo caucasian female with history of locally recurrent adenocarcinoma of the right breast with resection and radiation.  Aromasin restarted today. Patient in agreement with the plan.  Iron studies are pending.  No ESA needed this visit for Hgb 12.1.  Lab check and injection every 6 weeks. Follow-up in 3 months.   Lottie Dawson, NP 12/12/202310:25 AM

## 2022-09-24 ENCOUNTER — Other Ambulatory Visit: Payer: Self-pay | Admitting: Hematology & Oncology

## 2022-09-24 DIAGNOSIS — C50011 Malignant neoplasm of nipple and areola, right female breast: Secondary | ICD-10-CM

## 2022-09-24 LAB — CANCER ANTIGEN 27.29: CA 27.29: 16.8 U/mL (ref 0.0–38.6)

## 2022-10-07 DIAGNOSIS — L608 Other nail disorders: Secondary | ICD-10-CM | POA: Diagnosis not present

## 2022-10-13 DIAGNOSIS — I10 Essential (primary) hypertension: Secondary | ICD-10-CM | POA: Diagnosis not present

## 2022-10-13 DIAGNOSIS — I700 Atherosclerosis of aorta: Secondary | ICD-10-CM | POA: Diagnosis not present

## 2022-10-15 ENCOUNTER — Encounter (HOSPITAL_BASED_OUTPATIENT_CLINIC_OR_DEPARTMENT_OTHER): Payer: Medicare Other | Attending: Physician Assistant | Admitting: Physician Assistant

## 2022-10-15 DIAGNOSIS — L98492 Non-pressure chronic ulcer of skin of other sites with fat layer exposed: Secondary | ICD-10-CM | POA: Diagnosis not present

## 2022-10-15 DIAGNOSIS — Z86718 Personal history of other venous thrombosis and embolism: Secondary | ICD-10-CM | POA: Insufficient documentation

## 2022-10-15 DIAGNOSIS — Y842 Radiological procedure and radiotherapy as the cause of abnormal reaction of the patient, or of later complication, without mention of misadventure at the time of the procedure: Secondary | ICD-10-CM | POA: Insufficient documentation

## 2022-10-15 DIAGNOSIS — Z9221 Personal history of antineoplastic chemotherapy: Secondary | ICD-10-CM | POA: Diagnosis not present

## 2022-10-15 DIAGNOSIS — L598 Other specified disorders of the skin and subcutaneous tissue related to radiation: Secondary | ICD-10-CM | POA: Insufficient documentation

## 2022-10-15 DIAGNOSIS — I10 Essential (primary) hypertension: Secondary | ICD-10-CM | POA: Insufficient documentation

## 2022-10-15 DIAGNOSIS — L589 Radiodermatitis, unspecified: Secondary | ICD-10-CM | POA: Insufficient documentation

## 2022-10-15 DIAGNOSIS — Z853 Personal history of malignant neoplasm of breast: Secondary | ICD-10-CM | POA: Insufficient documentation

## 2022-10-15 NOTE — Progress Notes (Signed)
[name], [name] ([id number]) [id number] Page 1 of 2 Visit Report for 10/15/2022 Chief Complaint Document Details Patient Name: Date of Service: [name], [name] 10/15/2022 9:30 A M Medical Record Number: [id number] Patient Account Number: [id number] Date of Birth/Sex: Treating RN: 1951-07-11 (72 y.o. 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 F) Primary Care Provider: [name] Other Clinician: Referring Provider: Treating Provider/Extender: [name], [name] in Treatment: [phone] Active Problems ICD-10 Encounter Code Description Active Date MDM Diagnosis L98.492 Non-pressure chronic ulcer of skin of other sites with fat layer 08/22/2020 No Yes exposed L58.9 Radiodermatitis, unspecified 08/22/2020 No Yes L59.8 Other specified disorders of the skin and subcutaneous tissue 08/22/2020 No Yes related to radiation I10 Essential (primary) hypertension 08/22/2020 No Yes Inactive Problems Resolved Problems [name], [name] ([id number]) [id number] Page 2 of 2 Electronic Signature(s) Signed: 10/15/2022 9:10:29 AM By: Worthy Keeler PA-C Entered By: Worthy Keeler on 10/15/2022 09:10:29

## 2022-10-16 NOTE — Progress Notes (Signed)
ATHZIRY, MILLICAN ([id number]) [id number] Page 1 of 5 Visit Report for 09/17/2022 Arrival Information Details Patient Name: Date of Service: [name], Mertzon YE 09/17/2022 9:30 A M Medical Record Number: [id number] Patient Account Number: [id number] Date of Birth/Sex: Treating RN: 05/10/1951 (72 y.o. F) Primary Care Arriyanna Mersch: [name] Other Clinician: Referring Sereen Schaff: Treating Cruz Bong/Extender: [name], [name]: 25 Visit Information History Since Last Visit Added or deleted any medications: No Patient Arrived: Ambulatory Any new allergies or adverse reactions: No Arrival Time: 09:20 Had a fall or experienced change in No Accompanied By: self activities of daily living that may affect Transfer Assistance: Manual risk of falls: Patient Identification Verified: Yes Signs or symptoms of abuse/neglect since last visito No Secondary Verification Process Completed: Yes Hospitalized since last visit: No Patient Requires Transmission-Based Precautions: No Implantable device outside of the clinic excluding No Patient Has Alerts: No cellular tissue based products placed in the center since last visit: Has Dressing in Place as Prescribed: Yes Pain Present Now: Yes Electronic Signature(s) Signed: 09/17/2022 4:44:51 PM By: Erenest Blank Entered By: Erenest Blank on 09/17/2022 09:20:58 -------------------------------------------------------------------------------- Encounter Discharge Information Details Patient Name: Date of Service: [name], [name] 09/17/2022 9:30 A M Medical Record Number: [id number] Patient Account Number: [id number] Date of Birth/Sex: Treating RN: January 05, 1951 (72 y.o. [name], [name] Primary Care Bettyjean Stefanski: [name] Other Clinician: Referring Keawe Marcello: Treating Ketara Cavness/Extender: [name], [name]: [phone] Encounter Discharge Information Items Post Procedure  Vitals Discharge Condition: Stable Temperature (F): 98 Ambulatory Status: Ambulatory Pulse (bpm): 74 Discharge Destination: Home Respiratory Rate (breaths/min): 17 Transportation: Private Auto Blood Pressure (mmHg): 134/74 Accompanied By: self Schedule Follow-up Appointment: Yes Clinical Summary of Care: Patient Declined Electronic Signature(s) Signed: 10/15/2022 5:35:40 PM By: Rhae Hammock RN Entered By: Rhae Hammock on 09/17/2022 09:55:47 [name] ([id number]) [id number] Page 2 of 5 -------------------------------------------------------------------------------- Lower Extremity Assessment Details Patient Name: Date of Service: [name], [name] 09/17/2022 9:30 A M Medical Record Number: [id number] Patient Account Number: [id number] Date of Birth/Sex: Treating RN: 01/23/1951 (72 y.o. F) Primary Care Kanya Potteiger: [name] Other Clinician: Referring Twain Stenseth: Treating Abeera Flannery/Extender: [name], [name]: 108 Electronic Signature(s) Signed: 09/17/2022 4:44:51 PM By: Erenest Blank Entered By: Erenest Blank on 09/17/2022 09:21:40 -------------------------------------------------------------------------------- Multi-Disciplinary Care Plan Details Patient Name: Date of Service: [name], [name] 09/17/2022 9:30 A M Medical Record Number: [id number] Patient Account Number: [id number] Date of Birth/Sex: Treating RN: 04/23/1951 (72 y.o. [name], Becker Primary Care Nyeemah Jennette: [name] Other Clinician: Referring Ojas Coone: Treating Maylin [place]burg/Extender: [name], [name] in Treatment: [place] reviewed with physician Active Inactive Wound/Skin Impairment Nursing Diagnoses: Impaired tissue integrity Knowledge deficit related to ulceration/compromised skin integrity Goals: Patient/caregiver will verbalize understanding of skin care regimen Date Initiated:  08/22/2020 Target Resolution Date: 10/10/2022 Goal Status: Active Ulcer/skin breakdown will have a volume reduction of 30% by week 4 Date Initiated: 08/22/2020 Date Inactivated: 10/03/2020 Target Resolution Date: 09/19/2020 Goal Status: Met Ulcer/skin breakdown will have a volume reduction of 50% by week 8 Date Initiated: 06/05/2021 Date Inactivated: 07/17/2021 Target Resolution Date: 07/03/2021 Goal Status: Unmet Unmet Reason: infection Interventions: Assess patient/caregiver ability to obtain necessary supplies Assess patient/caregiver ability to perform ulcer/skin care regimen upon admission and as needed Assess ulceration(s) every visit Treatment Activities: Skin care regimen initiated : 08/22/2020 Topical wound management initiated : 08/22/2020 Notes: 06/05/21: Wound care regimen ongoing. 06/11/22: Wound care regimen continues, applying skin sub ([place]) Electronic Signature(s)  Signed: 10/15/2022 5:35:40 PM By: Rhae Hammock RN Entered By: Rhae Hammock on 09/17/2022 09:26:14 [name] ([id number]) [id number] Page 3 of 5 -------------------------------------------------------------------------------- Pain Assessment Details Patient Name: Date of Service: [name], [name] 09/17/2022 9:30 A M Medical Record Number: [id number] Patient Account Number: [id number] Date of Birth/Sex: Treating RN: 1951/05/24 (72 y.o. F) Primary Care Lonzie Simmer: [name] Other Clinician: Referring Russie Gulledge: Treating Alveria Mcglaughlin/Extender: [name], [name]: [phone] Active Problems Location of Pain Severity and Description of Pain Patient Has Paino Yes Site Locations Pain Location: Generalized Pain Rate the pain. Current Pain Level: 3 Pain Management and Medication Current Pain Management: Electronic Signature(s) Signed: 09/17/2022 4:44:51 PM By: Erenest Blank Entered By: Erenest Blank on 09/17/2022  09:21:35 -------------------------------------------------------------------------------- Patient/Caregiver Education Details Patient Name: Date of Service: [name], [name] 12/6/2023andnbsp9:30 A M Medical Record Number: [id number] Patient Account Number: [id number] Date of Birth/Gender: Treating RN: 07-Apr-1951 (72 y.o. [name], [name] Primary Care Physician: [name] Other Clinician: Referring Physician: Treating Physician/Extender: [name], [name] in Treatment: [phone] Education Assessment Education Provided To: Patient Education Topics Provided Wound/Skin Impairment: Methods: Explain/Verbal Responses: State content correctly [place], Letta Median ([id number]) [id number] Page 4 of 5 Electronic Signature(s) Signed: 10/15/2022 5:35:40 PM By: Rhae Hammock RN Entered By: Rhae Hammock on 09/17/2022 09:26:27 -------------------------------------------------------------------------------- Wound Assessment Details Patient Name: Date of Service: [name], [name] 09/17/2022 9:30 A M Medical Record Number: [id number] Patient Account Number: [id number] Date of Birth/Sex: Treating RN: [birth year] (72 y.o. F) Primary Care Shakeerah Gradel: [name] Other Clinician: Referring Randall Rampersad: Treating Arbor Cohen/Extender: [name], [name]: 108 Wound Status Wound Number: 1 Primary Soft Tissue Radionecrosis Etiology: Wound Location: Right Breast (mastectomy site) Wound Open Wounding Event: Radiation Burn Status: Date Acquired: 07/26/2020 Comorbid Anemia, Lymphedema, Deep Vein Thrombosis, Hypertension, Weeks Of Treatment: 108 History: Peripheral Venous Disease, Osteoarthritis, Received Clustered Wound: No Chemotherapy, Received Radiation, Confinement Anxiety Photos Wound Measurements Length: (cm) 1.7 Width: (cm) 2.6 Depth: (cm) 0.3 Area: (cm) 3.471 Volume: (cm) 1.041 % Reduction in Area: -126.6% %  Reduction in Volume: -580.4% Epithelialization: None Tunneling: No Undermining: No Wound Description Classification: Full Thickness Without Exposed Suppor Wound Margin: Distinct, outline attached Exudate Amount: Medium Exudate Type: Serosanguineous Exudate Color: red, brown t Structures Foul Odor After Cleansing: No Slough/Fibrino Yes Wound Bed Granulation Amount: Large (67-100%) Exposed Structure Granulation Quality: Red, Friable Fascia Exposed: No Necrotic Amount: Small (1-33%) Fat Layer (Subcutaneous Tissue) Exposed: Yes Necrotic Quality: Adherent Slough Tendon Exposed: No Muscle Exposed: No Joint Exposed: No Bone Exposed: No Periwound Skin Texture Texture Color No Abnormalities Noted: Yes No Abnormalities Noted: No Atrophie BlancheChristie Beckers SHRITA, THIEN ([id number]) [id number] Page 5 of 5 Cyanosis: No No Abnormalities Noted: Yes Ecchymosis: No Erythema: No Hemosiderin Staining: No Mottled: No Pallor: No Rubor: No Temperature / Pain Temperature: No Abnormality Electronic Signature(s) Signed: 09/17/2022 4:44:51 PM By: Erenest Blank Entered By: Erenest Blank on 09/17/2022 09:30:12 -------------------------------------------------------------------------------- Vitals Details Patient Name: Date of Service: [name], [name] 09/17/2022 9:30 A M Medical Record Number: [id number] Patient Account Number: [id number] Date of Birth/Sex: Treating RN: 10/13/1951 (72 y.o. F) Primary Care Ann Groeneveld: [name] Other Clinician: Referring Sheneka Schrom: Treating Paityn Balsam/Extender: [name], [name]: 108 Vital Signs Time Taken: 09:21 Temperature (F): 97.9 Height (in): 63 Pulse (bpm): 73 Weight (lbs): 176 Respiratory Rate (breaths/min): 18 Body Mass Index (BMI): 31.2 Blood Pressure (mmHg): 165/76 Reference Range: 80 - 120 mg / dl Electronic Signature(s) Signed: 09/17/2022 4:44:51  PM By: Erenest Blank Entered By: Erenest Blank on 09/17/2022 09:21:26

## 2022-10-16 NOTE — Progress Notes (Signed)
[name] ([id number]) [id number] Page 1 of 5 Visit Report for 10/15/2022 Arrival Information Details Patient Name: Date of Service: [name], [place] YE 10/15/2022 9:30 A M Medical Record Number: [id number] Patient Account Number: [id number] Date of Birth/Sex: Treating RN: 21-Feb-1951 (72 y.o. F) Primary Care [name]: [name] Other Clinician: Referring [name]: Treating [name]/Extender: [name], [name] in Treatment: 30 Visit Information History Since Last Visit All ordered tests and consults were completed: No Patient Arrived: Ambulatory Added or deleted any medications: No Arrival Time: 10:03 Any new allergies or adverse reactions: No Accompanied By: self Had a fall or experienced change in No Transfer Assistance: None activities of daily living that may affect Patient Identification Verified: Yes risk of falls: Secondary Verification Process Completed: Yes Signs or symptoms of abuse/neglect since last visito No Patient Requires Transmission-Based Precautions: No Hospitalized since last visit: No Patient Has Alerts: No Implantable device outside of the clinic excluding No cellular tissue based products placed in the center since last visit: Pain Present Now: No Electronic Signature(s) Signed: 10/15/2022 2:21:53 PM By: Worthy [name] Entered By: Worthy [name] on 10/15/2022 10:03:44 -------------------------------------------------------------------------------- Encounter Discharge Information Details Patient Name: Date of Service: [name], [name] 10/15/2022 9:30 A M Medical Record Number: [id number] Patient Account Number: [id number] Date of Birth/Sex: Treating RN: June 07, 1951 (72 y.o. [name] Primary Care [name]: [name] Other Clinician: Referring Lloyd [name]: Treating [name]/Extender: [name], [name] in Treatment: [phone] Encounter Discharge Information Items Post Procedure  Vitals Discharge Condition: Stable Temperature (F): 98.3 Ambulatory Status: Ambulatory Pulse (bpm): 85 Discharge Destination: Home Respiratory Rate (breaths/min): 20 Transportation: Private Auto Blood Pressure (mmHg): 170/75 Accompanied By: self Schedule Follow-up Appointment: Yes Clinical Summary of Care: Electronic Signature(s) Signed: 10/15/2022 5:47:40 PM By: [name] Pilling RN, BSN Entered By: [name] on 10/15/2022 10:58:16 [name] ([id number]) [id number] Page 2 of 5 -------------------------------------------------------------------------------- Multi-Disciplinary Care Plan Details Patient Name: Date of Service: [name], [name] 10/15/2022 9:30 A M Medical Record Number: [id number] Patient Account Number: [id number] Date of Birth/Sex: Treating RN: September 06, 1951 (72 y.o. [name] Primary Care Raidyn Breiner: [name] Other Clinician: Referring [name]: Treating [name]/Extender: [name], [name] in Treatment: [place] reviewed with physician Active Inactive Wound/Skin Impairment Nursing Diagnoses: Impaired tissue integrity Knowledge deficit related to ulceration/compromised skin integrity Goals: Patient/caregiver will verbalize understanding of skin care regimen Date Initiated: 08/22/2020 Target Resolution Date: 11/14/2022 Goal Status: Active Ulcer/skin breakdown will have a volume reduction of 30% by week 4 Date Initiated: 08/22/2020 Date Inactivated: 10/03/2020 Target Resolution Date: 09/19/2020 Goal Status: Met Ulcer/skin breakdown will have a volume reduction of 50% by week 8 Date Initiated: 06/05/2021 Date Inactivated: 07/17/2021 Target Resolution Date: 07/03/2021 Goal Status: Unmet Unmet Reason: infection Interventions: Assess patient/caregiver ability to obtain necessary supplies Assess patient/caregiver ability to perform ulcer/skin care regimen upon admission and as  needed Assess ulceration(s) every visit Treatment Activities: Skin care regimen initiated : 08/22/2020 Topical wound management initiated : 08/22/2020 Notes: 06/05/21: Wound care regimen ongoing. 06/11/22: Wound care regimen continues, applying skin sub ([place]) Electronic Signature(s) Signed: 10/15/2022 5:47:40 PM By: [name] Pilling RN, BSN Entered By: [name] on 10/15/2022 10:14:59 -------------------------------------------------------------------------------- Pain Assessment Details Patient Name: Date of Service: [name], [name] 10/15/2022 9:30 A M Medical Record Number: [id number] Patient Account Number: [id number] Date of Birth/Sex: Treating RN: [birth year] (72 y.o. F) Primary Care [name]: [name] Other Clinician: Referring [name]: Treating [name]/Extender: [name], [name] in Treatment: [phone] Active  Problems Location of Pain Severity and Description of Pain Patient Has Paino No Site Locations [place], [place] ([id number]) [id number] Page 3 of 5 Pain Management and Medication Current Pain Management: Electronic Signature(s) Signed: 10/15/2022 2:21:53 PM By: Worthy [name] Entered By: Worthy [name] on 10/15/2022 10:04:11 -------------------------------------------------------------------------------- Patient/Caregiver Education Details Patient Name: Date of Service: [name], [name] 1/3/2024andnbsp9:30 A M Medical Record Number: [id number] Patient Account Number: [id number] Date of Birth/Gender: Treating RN: 1951-09-13 (72 y.o. [name] Primary Care Physician: [name] Other Clinician: Referring Physician: Treating Physician/Extender: [name], [name] in Treatment: 112 Education Assessment Education Provided To: Patient Education Topics Provided Wound/Skin Impairment: Handouts: Caring for Your Ulcer Methods: Explain/Verbal Responses: Reinforcements needed Electronic  Signature(s) Signed: 10/15/2022 5:47:40 PM By: [name] Pilling RN, BSN Entered By: [name] on 10/15/2022 10:15:17 -------------------------------------------------------------------------------- Wound Assessment Details Patient Name: Date of Service: [name], [name] 10/15/2022 9:30 A M Medical Record Number: [id number] Patient Account Number: [id number] Date of Birth/Sex: Treating RN: 07/12/1951 (72 y.o. F) Primary Care Simcha Speir: [name] Other Clinician: Reather [name] ([id number]) [id number] Page 4 of 5 Referring Anthem Frazer: Treating Ziaire Hagos/Extender: [name], [name] in Treatment: 112 Wound Status Wound Number: 1 Primary Soft Tissue Radionecrosis Etiology: Wound Location: Right Breast (mastectomy site) Wound Open Wounding Event: Radiation Burn Status: Date Acquired: 07/26/2020 Comorbid Anemia, Lymphedema, Deep Vein Thrombosis, Hypertension, [name] Of Treatment: 112 History: Peripheral Venous Disease, Osteoarthritis, Received Clustered Wound: No Chemotherapy, Received Radiation, Confinement Anxiety Photos Wound Measurements Length: (cm) 1.6 Width: (cm) 2.5 Depth: (cm) 0.3 Area: (cm) 3.142 Volume: (cm) 0.942 % Reduction in Area: -105.1% % Reduction in Volume: -515.7% Epithelialization: None Tunneling: No Undermining: No Wound Description Classification: Full Thickness Without Exposed Suppo Wound Margin: Distinct, outline attached Exudate Amount: Medium Exudate Type: Serosanguineous Exudate Color: red, brown rt Structures Foul Odor After Cleansing: No Slough/Fibrino Yes Wound Bed Granulation Amount: Medium (34-66%) Exposed Structure Granulation Quality: Red, Friable Fascia Exposed: No Necrotic Amount: Medium (34-66%) Fat Layer (Subcutaneous Tissue) Exposed: Yes Necrotic Quality: Adherent Slough Tendon Exposed: No Muscle Exposed: No Joint Exposed: No Bone Exposed: No Periwound Skin Texture Texture Color No  Abnormalities Noted: Yes No Abnormalities Noted: No Atrophie Blanche: No Moisture Cyanosis: No No Abnormalities Noted: Yes Ecchymosis: No Erythema: No Hemosiderin Staining: No Mottled: No Pallor: No Rubor: No Temperature / Pain Temperature: No Abnormality Treatment Notes Wound #1 (Breast (mastectomy site)) Wound Laterality: Right Cleanser Wound Cleanser Discharge Instruction: Cleanse the wound with wound cleanser prior to applying a clean dressing using gauze sponges, not tissue or cotton balls. Soap and Water Discharge Instruction: May shower and wash wound with dial antibacterial soap and water prior to dressing change. MENDY, CHOU ([id number]) [id number] Page 5 of 5 Peri-Wound Care Skin Prep Discharge Instruction: Use skin prep as directed Topical Primary Dressing Promogran Prisma Matrix, 4.34 (sq in) (silver collagen) Discharge Instruction: Moisten collagen with saline or hydrogel Secondary Dressing ALLEVYN Gentle Border, 5x5 (in/in) Discharge Instruction: Apply over primary dressing as directed. Woven Gauze Sponges 2x2 in Discharge Instruction: Apply over primary dressing, fold in corners to fill the space Secured With Compression Wrap Compression Stockings Add-Ons Electronic Signature(s) Signed: 10/15/2022 5:35:13 PM By: Rhae Hammock RN Entered By: Rhae Hammock on 10/15/2022 10:22:27 -------------------------------------------------------------------------------- Vitals Details Patient Name: Date of Service: [name], [name] 10/15/2022 9:30 A M Medical Record Number: [id number] Patient Account Number: [id number] Date of Birth/Sex: Treating RN: [birth year] (72 y.o. F) Primary Care Tranise Forrest: [name] Other Clinician: Referring Geovanie Winnett: Treating Deshawn Skelley/Extender: Melburn Hake,  [name], [name] [name] in Treatment: 112 Vital Signs Time Taken: 10:03 Temperature (F): 98.3 Height (in): 63 Pulse (bpm): 85 Weight (lbs):  176 Respiratory Rate (breaths/min): 20 Body Mass Index (BMI): 31.2 Blood Pressure (mmHg): 170/75 Reference Range: 80 - 120 mg / dl Electronic Signature(s) Signed: 10/15/2022 2:21:53 PM By: Worthy [name] Entered By: Worthy [name] on 10/15/2022 10:04:04

## 2022-10-17 NOTE — Progress Notes (Deleted)
Office Visit Note  Patient: [name]             Date of Birth: Aug 27, 1951           MRN: [id number]             PCP: [name], MD Referring: [name], MD Visit Date: 10/31/2022 Occupation: @GUAROCC$ @  Subjective:  No chief complaint on file.   History of Present Illness: [name] is a 72 y.o. female ***     Activities of Daily Living:  Patient reports morning stiffness for *** {minute/hour:19697}.   Patient {ACTIONS;DENIES/REPORTS:21021675::"Denies"} nocturnal pain.  Difficulty dressing/grooming: {ACTIONS;DENIES/REPORTS:21021675::"Denies"} Difficulty climbing stairs: {ACTIONS;DENIES/REPORTS:21021675::"Denies"} Difficulty getting out of chair: {ACTIONS;DENIES/REPORTS:21021675::"Denies"} Difficulty using hands for taps, buttons, cutlery, and/or writing: {ACTIONS;DENIES/REPORTS:21021675::"Denies"}  No Rheumatology ROS completed.   PMFS History:  Patient Active Problem List   Diagnosis Date Noted   Rheumatoid arthritis involving multiple sites with positive rheumatoid factor ([place]) 07/31/2022   Primary osteoarthritis of right knee 07/31/2022   Essential hypertension 07/31/2022   History of hyperlipidemia 07/31/2022   History of DVT (deep vein thrombosis) 07/31/2022   History of gastroesophageal reflux (GERD) 07/31/2022   History of anxiety 07/31/2022   Erythropoietin deficiency anemia 02/13/2022   IDA (iron deficiency anemia) 02/01/2018   Lymphedema of right arm 08/24/2015   Breast cancer, right breast ([place]) 06/23/2011    Past Medical History:  Diagnosis Date   Anemia    Arthritis    Blood transfusion    Cancer ([place]) 2012   right breast   DVT (deep venous thrombosis) ([facility])    Erythropoietin deficiency anemia 02/13/2022   Hyperlipidemia    Hypertension    Leg swelling     Family History  Problem Relation Age of Onset   Cervical cancer Mother    Diabetes Mother    Hypertension Mother    Hypertension Father    Cancer Father 38       lung  cancer    Heart attack Father    Lung cancer Father    Parkinson's disease Sister    Hypertension Brother    Diabetes Brother    Heart disease Brother    Parkinson's disease Brother    Hypertension Brother    Diabetes Brother    Prostate cancer Brother    Hypertension Brother    Heart disease Brother    Diabetes Brother    Hypertension Brother    Breast cancer Neg Hx    Past Surgical History:  Procedure Laterality Date   BREAST SURGERY  10/13/1996   chest wall exploratory  07/01/2011   CHOLECYSTECTOMY     HIP ARTHROPLASTY     JOINT REPLACEMENT  04/04/2011   hip replacement    MASTECTOMY Right 2012   TUBAL LIGATION     Social History   Social History Narrative   Not on file   Immunization History  Administered Date(s) Administered   Influenza,inj,Quad PF,6+ Mos 08/07/2014, 07/12/2019   Influenza-Unspecified 07/24/2015     Objective: Vital Signs: There were no vitals taken for this visit.   Physical Exam   Musculoskeletal Exam: ***  CDAI Exam: CDAI Score: -- Patient Global: --; Provider Global: -- Swollen: --; Tender: -- Joint Exam 10/31/2022   No joint exam has been documented for this visit   There is currently no information documented on the homunculus. Go to the Rheumatology activity and complete the homunculus joint exam.  Investigation: No additional findings.  Imaging: MM DIAG BREAST TOMO UNI LEFT  Result Date:  09/22/2022 CLINICAL DATA:  Patient returns today to evaluate a possible LEFT breast asymmetry identified on recent screening mammogram. EXAM: DIGITAL DIAGNOSTIC UNILATERAL LEFT MAMMOGRAM WITH TOMOSYNTHESIS; ULTRASOUND LEFT BREAST LIMITED TECHNIQUE: Left digital diagnostic mammography and breast tomosynthesis was performed.; Targeted ultrasound examination of the left breast was performed. COMPARISON:  Previous exams including recent screening mammogram dated 09/01/2022. ACR Breast Density Category b: There are scattered areas of  fibroglandular density. FINDINGS: On today's additional diagnostic views, including spot compression with 3D tomosynthesis, a partially obscured mass is confirmed within the central to outer LEFT breast, outer LEFT breast based on tomosynthesis slice position, measuring approximately 8 mm greatest dimension. Targeted ultrasound is performed, showing a benign cluster of cysts in the LEFT breast at the 3 o'clock axis, 2 cm from the nipple, measuring 8 mm, corresponding to the mammographic finding. IMPRESSION: No evidence of malignancy. Benign cluster of cysts in the LEFT breast at the 3 o'clock axis, measuring 8 mm, corresponding to the mammographic finding. RECOMMENDATION: Screening mammogram in one year.(Code:SM-B-01Y) I have discussed the findings and recommendations with the patient. If applicable, a reminder letter will be sent to the patient regarding the next appointment. BI-RADS CATEGORY  2: Benign. Electronically Signed   By: Franki Cabot M.D.   On: 09/22/2022 14:46  US BREAST LTD UNI LEFT INC AXILLA  Result Date: 09/22/2022 CLINICAL DATA:  Patient returns today to evaluate a possible LEFT breast asymmetry identified on recent screening mammogram. EXAM: DIGITAL DIAGNOSTIC UNILATERAL LEFT MAMMOGRAM WITH TOMOSYNTHESIS; ULTRASOUND LEFT BREAST LIMITED TECHNIQUE: Left digital diagnostic mammography and breast tomosynthesis was performed.; Targeted ultrasound examination of the left breast was performed. COMPARISON:  Previous exams including recent screening mammogram dated 09/01/2022. ACR Breast Density Category b: There are scattered areas of fibroglandular density. FINDINGS: On today's additional diagnostic views, including spot compression with 3D tomosynthesis, a partially obscured mass is confirmed within the central to outer LEFT breast, outer LEFT breast based on tomosynthesis slice position, measuring approximately 8 mm greatest dimension. Targeted ultrasound is performed, showing a benign cluster of  cysts in the LEFT breast at the 3 o'clock axis, 2 cm from the nipple, measuring 8 mm, corresponding to the mammographic finding. IMPRESSION: No evidence of malignancy. Benign cluster of cysts in the LEFT breast at the 3 o'clock axis, measuring 8 mm, corresponding to the mammographic finding. RECOMMENDATION: Screening mammogram in one year.(Code:SM-B-01Y) I have discussed the findings and recommendations with the patient. If applicable, a reminder letter will be sent to the patient regarding the next appointment. BI-RADS CATEGORY  2: Benign. Electronically Signed   By: Franki Cabot M.D.   On: 09/22/2022 14:46   Recent Labs: Lab Results  Component Value Date   WBC 5.8 09/23/2022   HGB 12.1 09/23/2022   PLT 195 09/23/2022   NA 143 09/23/2022   K 3.6 09/23/2022   CL 105 09/23/2022   CO2 32 09/23/2022   GLUCOSE 104 (H) 09/23/2022   BUN 19 09/23/2022   CREATININE 0.58 09/23/2022   BILITOT 0.5 09/23/2022   ALKPHOS 68 09/23/2022   AST 14 (L) 09/23/2022   ALT 12 09/23/2022   PROT 7.5 09/23/2022   ALBUMIN 4.2 09/23/2022   CALCIUM 9.2 09/23/2022   GFRAA >60 05/17/2020    Speciality Comments: No specialty comments available.  Procedures:  No procedures performed Allergies: Contrast media [iodinated contrast media], Metrizamide, Other, Dye fdc blue [brilliant blue fcf (fd&c blue #1)], and Fd&c blue #1 (brilliant blue)   Assessment / Plan:  Visit Diagnoses: No diagnosis found.  Orders: No orders of the defined types were placed in this encounter.  No orders of the defined types were placed in this encounter.   Face-to-face time spent with patient was *** minutes. Greater than 50% of time was spent in counseling and coordination of care.  Follow-Up Instructions: No follow-ups on file.   Earnestine Mealing, CMA  Note - This record has been created using Editor, commissioning.  Chart creation errors have been sought, but may not always  have been located. Such creation errors do not  reflect on  the standard of medical care.

## 2022-10-24 ENCOUNTER — Encounter: Payer: Self-pay | Admitting: Family

## 2022-10-28 DIAGNOSIS — R7303 Prediabetes: Secondary | ICD-10-CM | POA: Diagnosis not present

## 2022-10-28 DIAGNOSIS — E782 Mixed hyperlipidemia: Secondary | ICD-10-CM | POA: Diagnosis not present

## 2022-10-31 ENCOUNTER — Ambulatory Visit: Payer: Medicare Other | Admitting: Rheumatology

## 2022-10-31 DIAGNOSIS — Z96643 Presence of artificial hip joint, bilateral: Secondary | ICD-10-CM

## 2022-10-31 DIAGNOSIS — C50011 Malignant neoplasm of nipple and areola, right female breast: Secondary | ICD-10-CM

## 2022-10-31 DIAGNOSIS — M79671 Pain in right foot: Secondary | ICD-10-CM

## 2022-10-31 DIAGNOSIS — Z79899 Other long term (current) drug therapy: Secondary | ICD-10-CM

## 2022-10-31 DIAGNOSIS — I10 Essential (primary) hypertension: Secondary | ICD-10-CM

## 2022-10-31 DIAGNOSIS — Z86718 Personal history of other venous thrombosis and embolism: Secondary | ICD-10-CM

## 2022-10-31 DIAGNOSIS — Z8261 Family history of arthritis: Secondary | ICD-10-CM

## 2022-10-31 DIAGNOSIS — M0579 Rheumatoid arthritis with rheumatoid factor of multiple sites without organ or systems involvement: Secondary | ICD-10-CM

## 2022-10-31 DIAGNOSIS — M1711 Unilateral primary osteoarthritis, right knee: Secondary | ICD-10-CM

## 2022-10-31 DIAGNOSIS — I890 Lymphedema, not elsewhere classified: Secondary | ICD-10-CM

## 2022-10-31 DIAGNOSIS — Z8719 Personal history of other diseases of the digestive system: Secondary | ICD-10-CM

## 2022-10-31 DIAGNOSIS — M79641 Pain in right hand: Secondary | ICD-10-CM

## 2022-10-31 DIAGNOSIS — G8929 Other chronic pain: Secondary | ICD-10-CM

## 2022-10-31 DIAGNOSIS — D631 Anemia in chronic kidney disease: Secondary | ICD-10-CM

## 2022-10-31 DIAGNOSIS — Z8659 Personal history of other mental and behavioral disorders: Secondary | ICD-10-CM

## 2022-10-31 DIAGNOSIS — Z8639 Personal history of other endocrine, nutritional and metabolic disease: Secondary | ICD-10-CM

## 2022-11-03 ENCOUNTER — Inpatient Hospital Stay: Payer: BLUE CROSS/BLUE SHIELD

## 2022-11-10 DIAGNOSIS — Z6835 Body mass index (BMI) 35.0-35.9, adult: Secondary | ICD-10-CM | POA: Diagnosis not present

## 2022-11-10 DIAGNOSIS — E782 Mixed hyperlipidemia: Secondary | ICD-10-CM | POA: Diagnosis not present

## 2022-11-10 DIAGNOSIS — R7303 Prediabetes: Secondary | ICD-10-CM | POA: Diagnosis not present

## 2022-11-10 DIAGNOSIS — I700 Atherosclerosis of aorta: Secondary | ICD-10-CM | POA: Diagnosis not present

## 2022-11-10 DIAGNOSIS — Z23 Encounter for immunization: Secondary | ICD-10-CM | POA: Diagnosis not present

## 2022-11-10 DIAGNOSIS — I10 Essential (primary) hypertension: Secondary | ICD-10-CM | POA: Diagnosis not present

## 2022-11-12 ENCOUNTER — Encounter (HOSPITAL_BASED_OUTPATIENT_CLINIC_OR_DEPARTMENT_OTHER): Payer: Medicare Other | Admitting: Physician Assistant

## 2022-11-12 DIAGNOSIS — L598 Other specified disorders of the skin and subcutaneous tissue related to radiation: Secondary | ICD-10-CM | POA: Diagnosis not present

## 2022-11-12 DIAGNOSIS — L98492 Non-pressure chronic ulcer of skin of other sites with fat layer exposed: Secondary | ICD-10-CM | POA: Diagnosis not present

## 2022-11-12 DIAGNOSIS — I10 Essential (primary) hypertension: Secondary | ICD-10-CM | POA: Diagnosis not present

## 2022-11-12 DIAGNOSIS — L589 Radiodermatitis, unspecified: Secondary | ICD-10-CM | POA: Diagnosis not present

## 2022-11-12 DIAGNOSIS — Z853 Personal history of malignant neoplasm of breast: Secondary | ICD-10-CM | POA: Diagnosis not present

## 2022-11-12 DIAGNOSIS — Z9221 Personal history of antineoplastic chemotherapy: Secondary | ICD-10-CM | POA: Diagnosis not present

## 2022-11-12 NOTE — Progress Notes (Addendum)
[name], [name] ([id number]) [id number] Page 1 of 10 Visit Report for 11/12/2022 Chief Complaint Document Details Patient Name: Date of Service: [name] 11/12/2022 9:30 A M Medical Record Number: [id number] Patient Account Number: [id number] Date of Birth/Sex: Treating RN: 1951-05-27 (72 y.o. 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 [name], Meta.Reding Primary Care Provider: [name] Other Clinician: Referring Provider: Treating Provider/Extender: [name], [name]: 116 Debridement Performed for Assessment: Wound #1 Right Breast (mastectomy site) Performed By: Physician Worthy Keeler, PA Debridement Type: Debridement Level of Consciousness (Pre-procedure): Awake and Alert Pre-procedure Verification/Time Out Yes - 09:35 Taken: Start Time: 09:36 T Area Debrided (L x W): otal 1.7 (cm) x 2.4 (cm) = 4.08 (cm) Tissue and other material debrided: Viable, Non-Viable, Slough, Subcutaneous, Slough Level: Skin/Subcutaneous Tissue Debridement Description: Excisional Instrument: Curette Bleeding: Minimum End Time: 09:41 Procedural Pain: 0 Post Procedural Pain: 0 Response to Treatment: Procedure was tolerated well Level of Consciousness (Post- Awake and  Alert procedure): Post Debridement Measurements of Total Wound Length: (cm) 1.7 Width: (cm) 2.4 Depth: (cm) 0.3 Volume: (cm) 0.961 Character of Wound/Ulcer Post Debridement: Improved Post Procedure Diagnosis Same as [name] ([id number]) [id number] Page 2 of 10 Electronic Signature(s) Signed: 11/12/2022 4:50:42 PM By: Worthy Keeler PA-C Signed: 11/13/2022 5:30:52 PM By: [name] RN, BSN Entered By: [name] on 11/12/2022 09:41:00 -------------------------------------------------------------------------------- HPI Details Patient Name: Date of Service: GO [name], [name] 11/12/2022 9:30 A M Medical Record Number: [id number] Patient Account Number: [id number] Date of Birth/Sex: Treating RN: 1951-08-17 (72 y.o. F) Primary Care Provider: [name] Other Clinician: Referring Provider: Treating Provider/Extender: [name], [name] in Treatment: 116 History of Present Illness HPI Description: 08/22/2020 upon evaluation today patient actually appears to be doing somewhat poorly in regard to her mastectomy site on the right chest wall. She had a mastectomy initially in 1998. She had chemotherapy only no radiation following. In 2002 she subsequently did undergo radiation for the first time and then subsequently in 2012 had repeat radiation after having had a finding that was consistent with a relapse of the cancer. 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 She also has radiation damage to the skin over the right chest wall where she had the mastectomy. She was referred to [place] by Dr. Matthew Saras in [facility] and her oncologist is Dr. Burney Gauze. Subsequently I want to make sure before we delve into this more deeply that the patient does not have any issues with a return of cancer that needs to be managed by them. Obviously she does have significant scar tissue  which may be benefited secondary to the soft tissue radionecrosis by hyperbaric oxygen therapy in the absence of any recurrence of the cancer. Nonetheless she does not remember exactly when her last PET scan was but it was not too recently she tells me. 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 The patient does have hypertension  as well. She sees her oncologist next on December 6. 09/12/2020 on evaluation today patient actually appears to be doing excellent in regard to her wound under the right breast location. Currently this is measuring smaller in general seems to be doing very well. She tells me that the collagen is doing a good job and that the dressing that she has been putting on is not causing any troubles as far pulling on her skin or scar tissue is concerned all of which is excellent news. 10/03/2020 upon evaluation today patient appears to be doing well with regard to her surgical site from her mastectomy on the right breast region. She tells me that she has had very little bleeding nothing seems to be sticking badly and in general she has been extremely pleased with where things stand today. No fevers, chills, nausea, vomiting, or diarrhea. 10/24/2020 upon evaluation today patient actually appears to be doing excellent in regard to her wound. There is just a very small area still open and to be honest there was minimal slough noted on the surface of the wound nothing that requires sharp debridement. In general I feel like she is actually doing quite well and overall I am pleased. I do think we may switch to silver alginate dressing and also contemplating using a AandE ointment in order to help keep everything moist and the scar tissue region while the alginate keeps it dry enough to heal 11/14/2020 upon evaluation today patient appears to be doing well at this point in regard to her wound. I do feel like that she is making good progress again this is a very difficult region  over the surgical site of the right chest wall at the site of mastectomy. Nonetheless I think that we are just a very small area still open I think alginate is doing a good job helping to dry this up and I would recommend this such that we continue with this. 01/02/2021 on evaluation today patient's wound actually appears to be doing decently well. There does not appear to be any signs of active infection which is great news. She does have some slight slough buildup with biofilm on the surface of the wound mainly more biofilm. Nonetheless I was able to gently remove this with saline and gauze as well as a sterile Q-tip. She tolerated that today without complication. 01/30/2021 upon evaluation today patient appears to be doing well with regard to her wound although she still has quite a bit of trouble getting this to close I think the scar tissue and radiation damage is quite significant here unfortunately. There does not appear to be any signs of active infection which is great news. No fevers, chills, nausea, vomiting, or diarrhea. 02/27/2021 upon evaluation today patient appears to be doing excellent in regard to her wound. She has been tolerating the dressing changes without complication and in general I am extremely pleased with where things stand today. There does not appear to be any signs of infection which is great news. No fevers, chills, nausea, vomiting, or diarrhea. With that being said I do think that she still developing some slough buildup. I did discuss with her today the possibility of proceeding with HBO therapy. We have had this discussion before but she really is not quite committed to wanting to do that much and spend that much time in the chamber. She wants to still think about it. 03/27/2021 upon evaluation today patient appears to be doing about the same in regard to her  wound. She still developing a lot of slough buildup on the surface of the wound. I did try to clean that away to  some degree today. She tolerated that without any pain or complication. Subsequently I am thinking we may want to switch over to [facility] see if this may do better for her. Patient is in agreement with giving that a trial. 05/01/2021 upon evaluation today patient appears to be doing about the same in regard to her wounds. She has been tolerating the dressing changes without complication. Fortunately there does not appear to be any signs of active infection at this time which is great news. No fevers, chills, nausea, vomiting, or diarrhea.. 06/05/2021 upon evaluation today patient appears to be doing pretty well currently in regard to her wound at the mastectomy site right chest wall. Overall since we switch back to the alginate she tells me things have significantly improved she is much happier with where things stand currently. Fortunately there does not appear to be any signs of active infection which is great news. No fevers, chills, nausea, vomiting, or diarrhea. 07/10/2021 upon evaluation today patient unfortunately does not appear to be doing nearly as well as what she previously was. There does not appear to be any evidence of new epithelial growth she has a lot of necrotic tissue the base of the wound this is much more significant than what we previously noted. She also has been having increased pain and increased drainage. In general I am very concerned about this especially in light of the fact that she does have a history of breast cancer I want to ensure that were not looking at any cancerous type lesion at this point. Otherwise also think she does need some debridement we did do MolecuLight scanning today. 07/17/2021 upon evaluation today patient appears to be doing well with regard to her wound compared to last week although she still having some erythema I am [name], [name] ([id number]) [phone].pdf Page 3 of 10 concerned in this regard. I do think that she fortunately  had a negative biopsy which is great news unfortunately she did have Pseudomonas noted as a bacteria present in the culture which I think is good and need to be addressed with Levaquin. I Minna send that into the pharmacy today. We did repeat the MolecuLight screening. 07/31/2021 upon evaluation today patient's wound is actually showing signs of improvement which is great news. Fortunately there does not appear to be any signs of infection currently locally nor systemically and I am very pleased in that regard. With that being said the patient is having some issues here with continued necrotic tissue I think packing with the Dakin's moistened gauze dressing is still in the right leg ago. 08/14/2021 upon evaluation today patient appears to be doing well with regard to her wound all things considered I do not see any signs of significant infection which is great news. Fortunately there does not appear to be any evidence of infection which is also great news. In general I think that the patient is tolerating the dressing changes well. We been using a Dakin's moistened gauze. 08/28/2021 upon evaluation today patient appears to be doing well with regard to her wound. Worsening signs of definite improvement which is great news and overall very pleased with where we stand today. There is no signs of inflamed tissue or infection which is great as well and I think the Dakin's is doing a great job here. 09/11/2021 upon evaluation today patient appears to be  doing well with regard to her wound all things considered. I am can perform some debridement today to clear away some of the necrotic debris with that being said overall I think that she is making decently good progress here which is great news. There does not appear to be any signs of active infection also good news. That in fact is probably the best news in her mind. 09/25/2021 upon evaluation today patient appears to be doing decently well in regard to her  wound. Overall I do not see any signs of infection and I think she is doing quite well. I do believe that we are making headway here although is very slow due to the scarring and the depth of the wound this is a significantly deep wound. 10/16/2021 upon evaluation today patient appears to be doing about the same in regard to the wound on her right chest wall. Fortunately there does not appear to be any signs of active infection locally nor systemically which is great news. With that being said this still was not cleaning up quite as quickly as I would like to see. Fortunately I think that we can definitely give the Santyl another try we tried this in the past and unfortunately did not have a good result but again I think she was infected at that time as well which was part of the issue. Nonetheless I think currently that we will be able to go ahead and see what we can do about getting this little cleaner with the Santyl. 11/06/2021 upon evaluation today patient appears to be doing well with regard to her wound. This actually appears to be a little bit better with the Santyl I am happy in that regard. Fortunately I do not see any signs of active infection at this time. No fevers, chills, nausea, vomiting, or diarrhea. 12/04/2021 upon evaluation patient appears to be doing well with the Santyl at this point. I am very pleased with where we stand and I think that she is making good progress here. Fortunately I do not see any evidence of active infection locally or systemically at this time which is great news. No fevers, chills, nausea, vomiting, or diarrhea. 01/01/2022 upon evaluation today patient actually is making excellent progress with the Santyl. I am extremely pleased with where we stand today. I do not see any signs of infection. 01-29-2022 upon evaluation today patient appears to be doing well with regard to her wound. This is showing signs of some improvement. It is a little less deep than what it  was. Size wise is also slightly smaller but again there still is a significant wound in this area. Fortunately I do not see any evidence of infection and she is doing quite well. 02-26-2022 upon evaluation today patient appears to be doing well with regard to her wound. In fact this is actually the best that I have seen in a very long time. I do not see any evidence of active infection at this time locally or systemically which is great news and overall I think we are on the right track. Nonetheless I do believe that the patient is continuing to show evidence of improvement with the Santyl week by week. 03-26-2022 upon evaluation today patient appears to be doing well with regard to the wound on her right mastectomy site. She is actually showing signs of excellent improvement in fact there was some bleeding just with a little bit of light cleaning over the area. Fortunately I do not see  any signs of active infection locally or systemically at this time which is great news. 04-30-2022 upon evaluation today patient appears to be doing well currently in regard to her wound. She is actually showing some signs of improvement here which is great news. Still this is very very slow. Subsequently I do believe that she may benefit from the use of [place] topical antibiotics for short amount of time before applying a skin substitute I think EpiCord could be doing very well for her as far as that is concerned. I discussed that with her today. Fortunately I do not see any evidence of active infection locally or systemically at this time which is great news. No fevers, chills, nausea, vomiting, or diarrhea. 05-28-2022 upon evaluation today patient's wound is actually showing signs of significant improvement. Fortunately I do not see any evidence of infection at this time which is great news. No fevers, chills, nausea, vomiting, or diarrhea. With that being said I do believe that the patient is tolerating the  dressing changes without complication. We been using the [facility] which I think is helpful. We do have approval for the [place]. 06-11-2022 upon evaluation today patient appears to be doing well with regard to her wound she is actually here today for the first application of [place] which I think is good to be beneficial for her. I do think that we will probably be able to keep this in place without can be the one thing of question to be honest. 06-18-2022 upon evaluation today patient actually appears to be doing excellent today. She has 1 treatment of the EpiCord underway and the wound surface already looks much better. This will be EpiCord #2 today. Fortunately I think we are on the right track here. 06-25-2022 upon evaluation today patient appears to be making good progress and overall the patient seems to have tolerated EpiCord without any complication. I do feel like that the wound is improving quite significantly. This is EpiCord #3 today. 07-02-2021 upon evaluation today patient's wound is actually showing signs of improvement. I am actually very pleased with where we stand we have been seeing some improvements in size overall and I think that we are still in the right track although there is a little bit of need for sharp debridement today to clearway some of the necrotic debris and remaining EpiCord which is actually still somewhat adhered to the wound bed. Overall I am extremely pleased though with where we stand. This is EpiCord #4 today. Upon evaluation today patient appears to be doing well with regard to her wound the EpiCord is definitely helping and does seem to be improving the overall status of the wound the surface is dramatically improved compared to what it was at the start of this. The tissue is actually becoming more red than it is just a pale pink and to be honest some of the did not even appear to be pink in the start. Nonetheless I am very pleased with where we stand currently. No  fevers, chills, nausea, vomiting, or diarrhea. 07-16-2022 upon evaluation today patient appears to be doing well currently in regard to her wound she is doing well with the [place] this is application #6 today. 07-23-2022 upon evaluation today patient's wound actually showing signs of excellent improvement which is great news. Fortunately I think were making progress again this is a very scarred area which now has a pretty good vascular supply which is great news. Overall I think that she is making great progress.  This is EpiCord #7 today 10/18; Epicort No. 8. Very minimally smaller. Wounds on the right breast mastectomy site complicated by soft tissue radiation damage. Unfortunately she lives in [place], 5 days a week transport would be impossible for her to arrange 08-06-2022 upon evaluation today patient's wound is showing signs again of minimal improvement with regard to the wound bed again this is something that would probably respond well to hyperbaric oxygen therapy and I think should we can probably get this approved but she is just not able to make the [place], Letta Median ([id number]) [id number] Page 4 of 10 transportation from [place] here daily for the 42-monthperiod of time. Nonetheless I do think that she seems to be doing much better with regard to the surface of the wound and I am pleased that regard I am going to go ahead and continue with the [place] is #9 application today. 08-13-2022 upon evaluation today patient's wound again is showing signs of improved quality to the tissue in the base of the wound. Fortunately I do not see any evidence of infection which is great news and overall I am extremely pleased in that regard. In general I do believe that the patient is making progress here. No fevers, chills, nausea, vomiting, or diarrhea. She is here today for EpiCord application ##40134/7 patient with a wound on the lower part of the right breast area. She is  apparently had a nice response to epi cord but she has completed this. It is difficult not to look over this lady's history and look at the wound and wonder about the known benefits of hyperbaric oxygen for soft tissue radionecrosis however she is unable to make transportation arrangements. [place] upon evaluation today patient appears to be doing well in regard to her wound. She has been tolerating the dressing changes without complication wheezing using the silver collagen over the past week and she tolerated that without complication. Fortunately I see no evidence of active infection locally or systemically at this time. 09-17-2022 upon evaluation today patient appears to be doing well currently in regard to her wound. She has been tolerating the dressing changes without complication. Fortunately there does not appear to be any signs of active infection locally or systemically at this time which is great news. No fevers, chills, nausea, vomiting, or diarrhea. 10-15-2022 upon evaluation today patient appears to be doing well currently in regard to her wound. This is showing signs of good improvement which is great news and overall I am extremely pleased with where we stand today. Fortunately there does not appear to be any signs of active infection at this time which is great news as well. No fevers, chills, nausea, vomiting, or diarrhea. 11-12-2022 upon evaluation today patient appears to be doing pretty well in regard to her wound. She has been tolerating the dressing changes without complication and fortunately there does not appear to be any signs of active infection locally nor systemically which is great news. No fevers, chills, nausea, vomiting, or diarrhea. Electronic Signature(s) Signed: 11/12/2022 12:55:25 PM By: SWorthy KeelerPA-C Entered By: SWorthy Keeleron 11/12/2022  12:55:25 -------------------------------------------------------------------------------- Physical Exam Details Patient Name: Date of Service: GO [name], [name] 11/12/2022 9:30 A M Medical Record Number: [id number] Account Number: [id number] of Birth/Sex: Treating RN: 206-20-1952((72y.o. F) Primary Care Provider: HMaryella ShiversOther Clinician: Referring Provider: Treating Provider/Extender: SZenon Mayo [name]: 116 Constitutional [name] and well-hydrated in no  acute distress. Respiratory normal breathing without difficulty. Psychiatric this patient is able to make decisions and demonstrates good insight into disease process. Alert and Oriented x 3. pleasant and cooperative. Notes Upon inspection patient's wound bed did require sharp debridement to clearway some of the necrotic debris he tolerated that today without complication and postdebridement the wound bed is significantly improved which is great news. Electronic Signature(s) Signed: 11/12/2022 12:55:53 PM By: Worthy Keeler PA-C Entered By: Worthy Keeler on 11/12/2022 12:55:52 -------------------------------------------------------------------------------- Physician Orders Details Patient Name: Date of Service: GO [name], [name] 11/12/2022 9:30 A M Medical Record Number: [id number] Patient Account Number: [id number] Date of Birth/Sex: Treating RN: [birth year] (72 y.o. [name] Primary Care Provider: [name] Other Clinician: Reather [name] ([id number]) [id number] Page 5 of 10 Referring Provider: Treating Provider/Extender: [name], [name]: [phone] Verbal / Phone Orders: No Diagnosis Coding ICD-10 Coding Code Description L98.492 Non-pressure chronic ulcer of skin of other sites with fat layer exposed L58.9 Radiodermatitis, unspecified L59.8 Other specified disorders of the skin and subcutaneous  tissue related to radiation I10 Essential (primary) hypertension Follow-up Appointments Return appointment in 1 month. Burman Blacksmith, PA on Wednesday's Room 8 12/10/2022 0930 Anesthetic (In clinic) Topical Lidocaine 5% applied to wound bed Cellular or Tissue Based Products daptic or Mepitel. (DO NOT REMOVE). - Cellular or Tissue Based Product applied to wound bed, secured with steri-strips, cover with A Epicord #1 06/11/22, #2 06/18/22, #3 06/25/22, #4 07/02/22, #5 07/09/22, #6 07/16/22, Epicord #7 07/23/22, Epicord #8 07/30/22, Epicord #9 08/06/2022. Epicord # 10 08/13/22 - no more after 07PX application, per PA we will see how she does after this with collagen for now. Additional Orders / Instructions Follow Nutritious Diet Wound Treatment Wound #1 - Breast (mastectomy site) Wound Laterality: Right Cleanser: Wound Cleanser (Generic) Every Other Day/30 Days Discharge Instructions: Cleanse the wound with wound cleanser prior to applying a clean dressing using gauze sponges, not tissue or cotton balls. Cleanser: Soap and Water Every Other Day/30 Days Discharge Instructions: May shower and wash wound with dial antibacterial soap and water prior to dressing change. Peri-Wound Care: Skin Prep (Generic) Every Other Day/30 Days Discharge Instructions: Use skin prep as directed Prim Dressing: Promogran Prisma Matrix, 4.34 (sq in) (silver collagen) (Dispense As Written) Every Other Day/30 Days ary Discharge Instructions: Moisten collagen with saline or hydrogel Secondary Dressing: Woven Gauze Sponges 2x2 in (DME) (Generic) Every Other Day/30 Days Discharge Instructions: Apply over primary dressing, fold in corners to fill the space Secondary Dressing: Zetuvit Plus Silicone Border Dressing 5x5 (in/in) (DME) (Generic) Every Other Day/30 Days Discharge Instructions: Apply silicone border over primary dressing as directed. Electronic Signature(s) Signed: 11/12/2022 4:50:42 PM By: Worthy Keeler PA-C Signed:  11/13/2022 5:30:52 PM By: [name] RN, BSN Entered By: [name] on 11/12/2022 09:45:35 -------------------------------------------------------------------------------- Problem List Details Patient Name: Date of Service: GO [name], [name] 11/12/2022 9:30 A M Medical Record Number: [id number] Patient Account Number: [id number] Date of Birth/Sex: Treating RN: [birth year] (72 y.o. F) Primary Care Provider: [name] Other Clinician: Referring Provider: Treating Provider/Extender: [name], [name] in Treatment: [address] Problems ICD-10 YARENI, CREPS ([id number]) [id number] Page 6 of 10 Encounter Code Description Active Date MDM Diagnosis L98.492 Non-pressure chronic ulcer of skin of other sites with fat layer exposed 08/22/2020 No Yes L58.9 Radiodermatitis, unspecified 08/22/2020 No Yes L59.8 Other specified disorders of the skin and subcutaneous tissue related to 08/22/2020 No Yes radiation I10 Essential (primary)  hypertension 08/22/2020 No Yes Inactive Problems Resolved Problems Electronic Signature(s) Signed: 11/12/2022 9:37:26 AM By: Worthy Keeler PA-C Entered By: Worthy Keeler on 11/12/2022 09:37:26 -------------------------------------------------------------------------------- Progress Note Details Patient Name: Date of Service: GO [name], [name] 11/12/2022 9:30 A M Medical Record Number: [id number] Patient Account Number: [id number] Date of Birth/Sex: Treating RN: 1951-07-20 (72 y.o. 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 She had a mastectomy initially in 1998. She had chemotherapy only no radiation following. In 2002 she subsequently did undergo radiation for the first time and then subsequently in 2012 had repeat radiation after having had a finding that was consistent with a relapse of the cancer. 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 She also has radiation damage to the skin over the right chest wall where she had the mastectomy. She was referred to [place] by Dr. Matthew Saras in [facility] and her oncologist is Dr. Burney Gauze. Subsequently I want to make sure before we delve into this more deeply that the patient does not have any issues with a return of cancer that needs to be managed by them. 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 Nonetheless she does not remember exactly when her last PET scan was but it was not too recently she tells me. 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 The patient does have hypertension as well. She sees her oncologist next on December 6. 09/12/2020 on evaluation today patient actually appears to be doing excellent in regard to her wound under the right breast location. Currently this is measuring smaller in general seems to be doing very well. She tells me that the collagen is doing a good job and that the dressing that she has been putting on is not causing any troubles as far pulling on her skin or scar tissue is concerned all of which is excellent news. 10/03/2020 upon evaluation today patient appears to be doing well with regard to her surgical site from her mastectomy on the right breast region. She tells me that she has had very little bleeding nothing seems to be sticking badly and in general she has been extremely pleased with  where things stand today. No fevers, chills, nausea, vomiting, or diarrhea. 10/24/2020 upon evaluation today patient actually appears to be doing excellent in regard to her wound. There is just a very small area still open and to be honest there was minimal slough noted on the surface of the wound nothing that requires sharp debridement. In general I feel like she is actually doing quite well and overall I am pleased. I do think we may switch to silver alginate dressing and also contemplating using a AandE ointment in order to help keep everything moist and the scar tissue region while the alginate keeps it dry enough to heal 11/14/2020 upon evaluation today patient appears to be doing well at this point in regard to her wound. I do feel like that she is making good progress  again this is a very difficult region over the surgical site of the right chest wall at the site of mastectomy. Nonetheless I think that we are just a very small area still open I think alginate is doing a good job helping to dry this up and I would recommend this such that we continue with this. 01/02/2021 on evaluation today patient's wound actually appears to be doing decently well. There does not appear to be any signs of active infection which is [name], [name] ([id number]) [id number] Page 7 of 10 great news. She does have some slight slough buildup with biofilm on the surface of the wound mainly more biofilm. Nonetheless I was able to gently remove this with saline and gauze as well as a sterile Q-tip. She tolerated that today without complication. 01/30/2021 upon evaluation today patient appears to be doing well with regard to her wound although she still has quite a bit of trouble getting this to close I think the scar tissue and radiation damage is quite significant here unfortunately. There does not appear to be any signs of active infection which is great news. No fevers, chills, nausea, vomiting, or  diarrhea. 02/27/2021 upon evaluation today patient appears to be doing excellent in regard to her wound. She has been tolerating the dressing changes without complication and in general I am extremely pleased with where things stand today. There does not appear to be any signs of infection which is great news. No fevers, chills, nausea, vomiting, or diarrhea. With that being said I do think that she still developing some slough buildup. I did discuss with her today the possibility of proceeding with HBO therapy. We have had this discussion before but she really is not quite committed to wanting to do that much and spend that much time in the chamber. She wants to still think about it. 03/27/2021 upon evaluation today patient appears to be doing about the same in regard to her wound. She still developing a lot of slough buildup on the surface of the wound. I did try to clean that away to some degree today. She tolerated that without any pain or complication. Subsequently I am thinking we may want to switch over to [facility] see if this may do better for her. Patient is in agreement with giving that a trial. 05/01/2021 upon evaluation today patient appears to be doing about the same in regard to her wounds. She has been tolerating the dressing changes without complication. Fortunately there does not appear to be any signs of active infection at this time which is great news. No fevers, chills, nausea, vomiting, or diarrhea.. 06/05/2021 upon evaluation today patient appears to be doing pretty well currently in regard to her wound at the mastectomy site right chest wall. Overall since we switch back to the alginate she tells me things have significantly improved she is much happier with where things stand currently. Fortunately there does not appear to be any signs of active infection which is great news. No fevers, chills, nausea, vomiting, or diarrhea. 07/10/2021 upon evaluation today patient unfortunately does  not appear to be doing nearly as well as what she previously was. There does not appear to be any evidence of new epithelial growth she has a lot of necrotic tissue the base of the wound this is much more significant than what we previously noted. She also has been having increased pain and increased drainage. In general I am very concerned about this especially in light of the fact that  she does have a history of breast cancer I want to ensure that were not looking at any cancerous type lesion at this point. Otherwise also think she does need some debridement we did do MolecuLight scanning today. 07/17/2021 upon evaluation today patient appears to be doing well with regard to her wound compared to last week although she still having some erythema I am concerned in this regard. I do think that she fortunately had a negative biopsy which is great news unfortunately she did have Pseudomonas noted as a bacteria present in the culture which I think is good and need to be addressed with Levaquin. I Minna send that into the pharmacy today. We did repeat the MolecuLight screening. 07/31/2021 upon evaluation today patient's wound is actually showing signs of improvement which is great news. Fortunately there does not appear to be any signs of infection currently locally nor systemically and I am very pleased in that regard. With that being said the patient is having some issues here with continued necrotic tissue I think packing with the Dakin's moistened gauze dressing is still in the right leg ago. 08/14/2021 upon evaluation today patient appears to be doing well with regard to her wound all things considered I do not see any signs of significant infection which is great news. Fortunately there does not appear to be any evidence of infection which is also great news. In general I think that the patient is tolerating the dressing changes well. We been using a Dakin's moistened gauze. 08/28/2021 upon evaluation  today patient appears to be doing well with regard to her wound. Worsening signs of definite improvement which is great news and overall very pleased with where we stand today. There is no signs of inflamed tissue or infection which is great as well and I think the Dakin's is doing a great job here. 09/11/2021 upon evaluation today patient appears to be doing well with regard to her wound all things considered. I am can perform some debridement today to clear away some of the necrotic debris with that being said overall I think that she is making decently good progress here which is great news. There does not appear to be any signs of active infection also good news. That in fact is probably the best news in her mind. 09/25/2021 upon evaluation today patient appears to be doing decently well in regard to her wound. Overall I do not see any signs of infection and I think she is doing quite well. I do believe that we are making headway here although is very slow due to the scarring and the depth of the wound this is a significantly deep wound. 10/16/2021 upon evaluation today patient appears to be doing about the same in regard to the wound on her right chest wall. Fortunately there does not appear to be any signs of active infection locally nor systemically which is great news. With that being said this still was not cleaning up quite as quickly as I would like to see. Fortunately I think that we can definitely give the Santyl another try we tried this in the past and unfortunately did not have a good result but again I think she was infected at that time as well which was part of the issue. Nonetheless I think currently that we will be able to go ahead and see what we can do about getting this little cleaner with the Santyl. 11/06/2021 upon evaluation today patient appears to be doing well with regard to  her wound. This actually appears to be a little bit better with the Santyl I am happy in that regard.  Fortunately I do not see any signs of active infection at this time. No fevers, chills, nausea, vomiting, or diarrhea. 12/04/2021 upon evaluation patient appears to be doing well with the Santyl at this point. I am very pleased with where we stand and I think that she is making good progress here. Fortunately I do not see any evidence of active infection locally or systemically at this time which is great news. No fevers, chills, nausea, vomiting, or diarrhea. 01/01/2022 upon evaluation today patient actually is making excellent progress with the Santyl. I am extremely pleased with where we stand today. I do not see any signs of infection. 01-29-2022 upon evaluation today patient appears to be doing well with regard to her wound. This is showing signs of some improvement. It is a little less deep than what it was. Size wise is also slightly smaller but again there still is a significant wound in this area. Fortunately I do not see any evidence of infection and she is doing quite well. 02-26-2022 upon evaluation today patient appears to be doing well with regard to her wound. In fact this is actually the best that I have seen in a very long time. I do not see any evidence of active infection at this time locally or systemically which is great news and overall I think we are on the right track. Nonetheless I do believe that the patient is continuing to show evidence of improvement with the Santyl week by week. 03-26-2022 upon evaluation today patient appears to be doing well with regard to the wound on her right mastectomy site. She is actually showing signs of excellent improvement in fact there was some bleeding just with a little bit of light cleaning over the area. Fortunately I do not see any signs of active infection locally or systemically at this time which is great news. 04-30-2022 upon evaluation today patient appears to be doing well currently in regard to her wound. She is actually showing some  signs of improvement here which is great news. Still this is very very slow. Subsequently I do believe that she may benefit from the use of [place] topical antibiotics for short amount of time before applying a skin substitute I think EpiCord could be doing very well for her as far as that is concerned. I discussed that with her today. Fortunately I do not see any evidence of active infection locally or systemically at this time which is great news. No fevers, chills, nausea, vomiting, or diarrhea. 05-28-2022 upon evaluation today patient's wound is actually showing signs of significant improvement. Fortunately I do not see any evidence of infection at this time which is great news. No fevers, chills, nausea, vomiting, or diarrhea. With that being said I do believe that the patient is tolerating the dressing [name], [name] ([id number]) [id number] Page 8 of 10 changes without complication. We been using the [facility] which I think is helpful. We do have approval for the [place]. 06-11-2022 upon evaluation today patient appears to be doing well with regard to her wound she is actually here today for the first application of [place] which I think is good to be beneficial for her. I do think that we will probably be able to keep this in place without can be the one thing of question to be honest. 06-18-2022 upon evaluation today patient actually appears to be doing excellent  today. She has 1 treatment of the EpiCord underway and the wound surface already looks much better. This will be EpiCord #2 today. Fortunately I think we are on the right track here. 06-25-2022 upon evaluation today patient appears to be making good progress and overall the patient seems to have tolerated EpiCord without any complication. I do feel like that the wound is improving quite significantly. This is EpiCord #3 today. 07-02-2021 upon evaluation today patient's wound is actually showing signs of improvement.  I am actually very pleased with where we stand we have been seeing some improvements in size overall and I think that we are still in the right track although there is a little bit of need for sharp debridement today to clearway some of the necrotic debris and remaining EpiCord which is actually still somewhat adhered to the wound bed. Overall I am extremely pleased though with where we stand. This is EpiCord #4 today. Upon evaluation today patient appears to be doing well with regard to her wound the EpiCord is definitely helping and does seem to be improving the overall status of the wound the surface is dramatically improved compared to what it was at the start of this. The tissue is actually becoming more red than it is just a pale pink and to be honest some of the did not even appear to be pink in the start. Nonetheless I am very pleased with where we stand currently. No fevers, chills, nausea, vomiting, or diarrhea. 07-16-2022 upon evaluation today patient appears to be doing well currently in regard to her wound she is doing well with the [place] this is application #6 today. 07-23-2022 upon evaluation today patient's wound actually showing signs of excellent improvement which is great news. Fortunately I think were making progress again this is a very scarred area which now has a pretty good vascular supply which is great news. Overall I think that she is making great progress. This is EpiCord #7 today 10/18; Epicort No. 8. Very minimally smaller. Wounds on the right breast mastectomy site complicated by soft tissue radiation damage. Unfortunately she lives in [place], 5 days a week transport would be impossible for her to arrange 08-06-2022 upon evaluation today patient's wound is showing signs again of minimal improvement with regard to the wound bed again this is something that would probably respond well to hyperbaric oxygen therapy and I think should we can probably get this approved but  she is just not able to make the transportation from [place] here daily for the 49-monthperiod of time. Nonetheless I do think that she seems to be doing much better with regard to the surface of the wound and I am pleased that regard I am going to go ahead and continue with the [place]ear[place] Heightsthis is #9 application today. 08-13-2022 upon evaluation today patient's wound again is showing signs of improved quality to the tissue in the base of the wound. Fortunately I do not see any evidence of infection which is great news and overall I am extremely pleased in that regard. In general I do believe that the patient is making progress here. No fevers, chills, nausea, vomiting, or diarrhea. She is here today for EpiCord application ##[id number]/3 patient with a wound on the lower part of the right breast area. She is apparently had a nice response to epi cord but she has completed this. It is difficult not to look over this lady's history and look at the wound and wonder about the known  benefits of hyperbaric oxygen for soft tissue radionecrosis however she is unable to make transportation arrangements. She lives in [place] 08-27-2022 upon evaluation today patient appears to be doing well in regard to her wound. She has been tolerating the dressing changes without complication wheezing using the silver collagen over the past week and she tolerated that without complication. Fortunately I see no evidence of active infection locally or systemically at this time. 09-17-2022 upon evaluation today patient appears to be doing well currently in regard to her wound. She has been tolerating the dressing changes without complication. Fortunately there does not appear to be any signs of active infection locally or systemically at this time which is great news. No fevers, chills, nausea, vomiting, or diarrhea. 10-15-2022 upon evaluation today patient appears to be doing well currently in regard to her wound. This is showing signs  of good improvement which is great news and overall I am extremely pleased with where we stand today. Fortunately there does not appear to be any signs of active infection at this time which is great news as well. No fevers, chills, nausea, vomiting, or diarrhea. 11-12-2022 upon evaluation today patient appears to be doing pretty well in regard to her wound. She has been tolerating the dressing changes without complication and fortunately there does not appear to be any signs of active infection locally nor systemically which is great news. No fevers, chills, nausea, vomiting, or diarrhea. Objective Constitutional [name] and well-hydrated in no acute distress. Vitals Time Taken: 9:22 AM, Height: 63 in, Weight: 176 lbs, BMI: 31.2, Temperature: 97.9 F, Pulse: 67 bpm, Respiratory Rate: 18 breaths/min, Blood Pressure: 151/77 mmHg. Respiratory normal breathing without difficulty. Psychiatric this patient is able to make decisions and demonstrates good insight into disease process. Alert and Oriented x 3. pleasant and cooperative. General Notes: Upon inspection patient's wound bed did require sharp debridement to clearway some of the necrotic debris he tolerated that today without complication and postdebridement the wound bed is significantly improved which is great news. Integumentary (Hair, Skin) Wound #1 status is Open. Original cause of wound was Radiation Burn. The date acquired was: 07/26/2020. The wound has been in treatment 116 weeks. The wound is located on the Right Breast (mastectomy site). The wound measures 1.7cm length x 2.4cm width x 0.3cm depth; 3.204cm^2 area and 0.961cm^3 volume. There is Fat Layer (Subcutaneous Tissue) exposed. There is no tunneling or undermining noted. There is a medium amount of serosanguineous drainage noted. The wound margin is distinct with the outline attached to the wound base. There is large (67-100%) red granulation within the wound bed. There  is [name], [name] ([id number]) [id number] Page 9 of 10 a small (1-33%) amount of necrotic tissue within the wound bed including Adherent Slough. The periwound skin appearance had no abnormalities noted for texture. The periwound skin appearance had no abnormalities noted for moisture. The periwound skin appearance did not exhibit: Atrophie Blanche, Cyanosis, Ecchymosis, Hemosiderin Staining, Mottled, Pallor, Rubor, Erythema. Periwound temperature was noted as No Abnormality. 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 There was a Excisional Skin/Subcutaneous Tissue Debridement with a total area of 4.08 sq cm performed by Worthy Keeler, PA. With the following instrument(s): Curette to remove Viable and Non-Viable  tissue/material. Material removed includes Subcutaneous Tissue and Slough and. A time out was conducted at 09:35, prior to the start of the procedure. A Minimum amount of bleeding was controlled with N/A. The procedure was tolerated well with a pain level of 0 throughout and a pain level of 0 following the procedure. Post Debridement Measurements: 1.7cm length x 2.4cm width x 0.3cm depth; 0.961cm^3 volume. Character of Wound/Ulcer Post Debridement is improved. Post procedure Diagnosis Wound #1: Same as Pre-Procedure Plan Follow-up Appointments: Return appointment in 1 month. Burman Blacksmith, PA on Wednesday's Room 8 12/10/2022 0930 Anesthetic: (In clinic) Topical Lidocaine 5% applied to wound bed Cellular or Tissue Based Products: Cellular or Tissue Based Product applied to wound bed, secured with steri-strips, cover with  Adaptic or Mepitel. (DO NOT REMOVE). - Epicord #1 06/11/22, #2 06/18/22, #3 06/25/22, #4 07/02/22, #5 07/09/22, #6 07/16/22, Epicord #7 07/23/22, Epicord #8 07/30/22, Epicord #9 08/06/2022. Epicord # 10 08/13/22 - no more after 27OJ application, per PA we will see how she does after this with collagen for now. Additional Orders / Instructions: Follow Nutritious Diet WOUND #1: - Breast (mastectomy site) Wound Laterality: Right Cleanser: Wound Cleanser (Generic) Every Other Day/30 Days Discharge Instructions: Cleanse the wound with wound cleanser prior to applying a clean dressing using gauze sponges, not tissue or cotton balls. Cleanser: Soap and Water Every Other Day/30 Days Discharge Instructions: May shower and wash wound with dial antibacterial soap and water prior to dressing change. Peri-Wound Care: Skin Prep (Generic) Every Other Day/30 Days Discharge Instructions: Use skin prep as directed Prim Dressing: Promogran Prisma Matrix, 4.34 (sq in) (silver collagen) (Dispense As Written) Every Other Day/30 Days ary Discharge Instructions: Moisten collagen with saline or hydrogel Secondary Dressing: Woven Gauze Sponges 2x2 in (DME) (Generic) Every Other Day/30 Days Discharge Instructions: Apply over primary dressing, fold in corners to fill the space Secondary Dressing: Zetuvit Plus Silicone Border Dressing 5x5 (in/in) (DME) (Generic) Every Other Day/30 Days Discharge Instructions: Apply silicone border over primary dressing as directed. 1. I am going to recommend that we have the patient continue to monitor for any signs of infection she seems to be doing quite well. She is going to start washing this with Vashe which I think will help as well as far as preventing any infection from instilling. 2. I am also can recommend that the patient should continue to monitor for any signs of overall worsening in regard to the wound. Right now I really feel like she is making some pretty good progress here which  is great news. We will see patient back for reevaluation in 4 weeks here in the clinic. If anything worsens or changes patient will contact our office for additional recommendations. Electronic Signature(s) Signed: 11/12/2022 12:56:43 PM By: Worthy Keeler PA-C Entered By: Worthy Keeler on 11/12/2022 12:56:42 [name] ([id number]) [id number] Page 10 of 10 -------------------------------------------------------------------------------- SuperBill Details Patient Name: Date of Service: GO [name], [name] 11/12/2022 Medical Record Number: [id number] Patient Account Number: [id number] Date of Birth/Sex: Treating RN: 1951/06/10 (72 y.o. [name] Primary Care Provider: [name] Other Clinician: Referring Provider: Treating Provider/Extender: [name], [name]: 116 Diagnosis Coding ICD-10 Codes Code Description [phone] Non-pressure chronic ulcer of skin of other sites with fat layer exposed L58.9 Radiodermatitis, unspecified L59.8 Other specified disorders of the skin and subcutaneous tissue related to radiation I10 Essential (primary) hypertension Facility Procedures : The patient participates with Medicare or their insurance follows the Medicare Facility Guidelines:  CPT4 Code Description Modifier Quantity [phone] 11042 - DEB SUBQ TISSUE 20 SQ CM/< 1 ICD-10 Diagnosis Description L98.492 Non-pressure chronic ulcer of  skin of other sites with fat layer exposed L59.8 Other specified disorders of the skin and subcutaneous tissue related to radiation Physician Procedures : CPT4 Code Description Modifier [id number] 11042 - WC PHYS SUBQ TISS 20 SQ CM ICD-10 Diagnosis Description L98.492 Non-pressure chronic ulcer of skin of other sites with fat layer exposed L59.8 Other specified disorders of the skin and subcutaneous tissue  related to radiation Quantity: 1 Electronic Signature(s) Signed: 11/12/2022 12:56:50 PM By: Worthy Keeler PA-C Entered By: Worthy Keeler on 11/12/2022 12:56:50

## 2022-11-13 DIAGNOSIS — C50919 Malignant neoplasm of unspecified site of unspecified female breast: Secondary | ICD-10-CM | POA: Diagnosis not present

## 2022-11-13 DIAGNOSIS — E785 Hyperlipidemia, unspecified: Secondary | ICD-10-CM | POA: Diagnosis not present

## 2022-11-14 NOTE — Progress Notes (Signed)
ANNALISE, [name] ([id number]) [id number] Page 1 of 6 Visit Report for 11/12/2022 Arrival Information Details Patient Name: Date of Service: [name] 11/12/2022 9:30 A M Medical Record Number: [id number] Patient Account Number: [id number] Date of Birth/Sex: Treating RN: 05/08/1951 (73 y.o. F) Primary Care Saiya Crist: [name] Other Clinician: Referring Abiola Behring: Treating Nelvin Tomb/Extender: [name], [name] in Treatment: 37 Visit Information History Since Last Visit Added or deleted any medications: No Patient Arrived: Ambulatory Any new allergies or adverse reactions: No Arrival Time: 09:18 Had a fall or experienced change in No Accompanied By: self activities of daily living that may affect Transfer Assistance: None risk of falls: Patient Identification Verified: Yes Signs or symptoms of abuse/neglect since last visito No Secondary Verification Process Completed: Yes Hospitalized since last visit: No Patient Requires Transmission-Based Precautions: No Implantable device outside of the clinic excluding No Patient Has Alerts: No cellular tissue based products placed in the center since last visit: Has Dressing in Place as Prescribed: Yes Pain Present Now: No Electronic Signature(s) Signed: 11/14/2022 12:35:45 PM By: Erenest Blank Entered By: Erenest Blank on 11/12/2022 09:22:30 -------------------------------------------------------------------------------- Encounter Discharge Information Details Patient Name: Date of Service: [name] INS, [name] 11/12/2022 9:30 A M Medical Record Number: [id number] Patient Account Number: [id number] Date of Birth/Sex: Treating RN: [birth year] (72 y.o. [name] Primary Care Rosan Calbert: [name] Other Clinician: Referring Keshon Markovitz: Treating Bethani Brugger/Extender: [name], [name] in Treatment: [phone] Encounter Discharge Information Items Post Procedure  Vitals Discharge Condition: Stable Temperature (F): 97.9 Ambulatory Status: Ambulatory Pulse (bpm): 67 Discharge Destination: Home Respiratory Rate (breaths/min): 20 Transportation: Private Auto Blood Pressure (mmHg): 151/77 Accompanied By: self Schedule Follow-up Appointment: Yes Clinical Summary of Care: Electronic Signature(s) Signed: 11/13/2022 5:30:52 PM By: [name] RN, BSN Entered By: [name] on 11/12/2022 09:46:52 [name] ([id number]) [id number] Page 2 of 6 -------------------------------------------------------------------------------- Lower Extremity Assessment Details Patient Name: Date of Service: [name] INS, [name] 11/12/2022 9:30 A M Medical Record Number: [id number] Patient Account Number: [id number] Date of Birth/Sex: Treating RN: 1951-09-22 (72 y.o. F) Primary Care Darlis Wragg: [name] Other Clinician: Referring Evelyn Aguinaldo: Treating Shae Hinnenkamp/Extender: [name], [name]: 116 Electronic Signature(s) Signed: 11/14/2022 12:35:45 PM By: Erenest Blank Entered By: Erenest Blank on 11/12/2022 09:23:06 -------------------------------------------------------------------------------- Multi-Disciplinary Care Plan Details Patient Name: Date of Service: [name] INS, [name] 11/12/2022 9:30 A M Medical Record Number: [id number] Patient Account Number: [id number] Date of Birth/Sex: Treating RN: 26-Aug-1951 (72 y.o. [name] Primary Care Lathen Seal: [name] Other Clinician: Referring Aliayah Tyer: Treating Lacrecia Delval/Extender: [name], [name] in Treatment: [place] reviewed with physician Active Inactive Wound/Skin Impairment Nursing Diagnoses: Impaired tissue integrity Knowledge deficit related to ulceration/compromised skin integrity Goals: Patient/caregiver will verbalize understanding of skin care regimen Date Initiated: 08/22/2020 Target  Resolution Date: 01/09/2023 Goal Status: Active Ulcer/skin breakdown will have a volume reduction of 30% by week 4 Date Initiated: 08/22/2020 Date Inactivated: 10/03/2020 Target Resolution Date: 09/19/2020 Goal Status: Met Ulcer/skin breakdown will have a volume reduction of 50% by week 8 Date Initiated: 06/05/2021 Date Inactivated: 07/17/2021 Target Resolution Date: 07/03/2021 Goal Status: Unmet Unmet Reason: infection Interventions: Assess patient/caregiver ability to obtain necessary supplies Assess patient/caregiver ability to perform ulcer/skin care regimen upon admission and as needed Assess ulceration(s) every visit Treatment Activities: Skin care regimen initiated : 08/22/2020 Topical wound management initiated : 08/22/2020 Notes: 06/05/21: Wound care regimen ongoing. 06/11/22: Wound care regimen continues, applying skin sub (Epicord) Electronic Signature(s) Signed:  11/13/2022 5:30:52 PM By: [name] RN, BSN Entered By: [name] on 11/12/2022 09:45:44 [name] ([id number]) [id number] Page 3 of 6 -------------------------------------------------------------------------------- Pain Assessment Details Patient Name: Date of Service: [name] INS, [name] 11/12/2022 9:30 A M Medical Record Number: [id number] Patient Account Number: [id number] Date of Birth/Sex: Treating RN: [birth year] (72 y.o. F) Primary Care Marvin Maenza: [name] Other Clinician: Referring Lorne Winkels: Treating Luster Hechler/Extender: [name], [name]: [phone] Active Problems Location of Pain Severity and Description of Pain Patient Has Paino No Site Locations Pain Management and Medication Current Pain Management: Electronic Signature(s) Signed: 11/14/2022 12:35:45 PM By: Erenest Blank Entered By: Erenest Blank on 11/12/2022 09:22:58 -------------------------------------------------------------------------------- Patient/Caregiver Education  Details Patient Name: Date of Service: [name], [name] 1/31/2024andnbsp9:30 A M Medical Record Number: [id number] Patient Account Number: [id number] Date of Birth/Gender: Treating RN: 1951/06/08 (72 y.o. [name] Primary Care Physician: [name] Other Clinician: Referring Physician: Treating Physician/Extender: [name], [name] in Treatment: [phone] Education Assessment Education Provided To: Patient Education Topics Provided Wound/Skin Impairment: Handouts: Caring for Your Ulcer Methods: Explain/Verbal [name], [name] ([id number]) [id number] Page 4 of 6 Responses: Reinforcements needed Electronic Signature(s) Signed: 11/13/2022 5:30:52 PM By: [name] RN, BSN Entered By: [name] on 11/12/2022 09:45:56 -------------------------------------------------------------------------------- Wound Assessment Details Patient Name: Date of Service: [name] INS, [name] 11/12/2022 9:30 A M Medical Record Number: [id number] Patient Account Number: [id number] Date of Birth/Sex: Treating RN: Dec 03, 1950 (72 y.o. F) Primary Care Beaux Wedemeyer: [name] Other Clinician: Referring Janmichael Giraud: Treating Quintez Maselli/Extender: [name], [name]: 116 Wound Status Wound Number: 1 Primary Soft Tissue Radionecrosis Etiology: Wound Location: Right Breast (mastectomy site) Wound Open Wounding Event: Radiation Burn Status: Date Acquired: 07/26/2020 Comorbid Anemia, Lymphedema, Deep Vein Thrombosis, Hypertension, Weeks Of Treatment: 116 History: Peripheral Venous Disease, Osteoarthritis, Received Clustered Wound: No Chemotherapy, Received Radiation, Confinement Anxiety Photos Wound Measurements Length: (cm) 1.7 Width: (cm) 2.4 Depth: (cm) 0.3 Area: (cm) 3.204 Volume: (cm) 0.961 % Reduction in Area: -109.1% % Reduction in Volume: -528.1% Epithelialization: None Tunneling: No Undermining: No Wound  Description Classification: Full Thickness Without Exposed Suppor Wound Margin: Distinct, outline attached Exudate Amount: Medium Exudate Type: Serosanguineous Exudate Color: red, brown t Structures Foul Odor After Cleansing: No Slough/Fibrino Yes Wound Bed Granulation Amount: Large (67-100%) Exposed Structure Granulation Quality: Red Fascia Exposed: No Necrotic Amount: Small (1-33%) Fat Layer (Subcutaneous Tissue) Exposed: Yes Necrotic Quality: Adherent Slough Tendon Exposed: No Muscle Exposed: No Joint Exposed: No Bone Exposed: No Periwound Skin Texture Texture Color No Abnormalities Noted: Yes No Abnormalities Noted: No Atrophie [name], [name] ([id number]) [id number] Page 5 of 6 Atrophie Blanche: No Moisture Cyanosis: No No Abnormalities Noted: Yes Ecchymosis: No Erythema: No Hemosiderin Staining: No Mottled: No Pallor: No Rubor: No Temperature / Pain Temperature: No Abnormality Treatment Notes Wound #1 (Breast (mastectomy site)) Wound Laterality: Right Cleanser Wound Cleanser Discharge Instruction: Cleanse the wound with wound cleanser prior to applying a clean dressing using gauze sponges, not tissue or cotton balls. Soap and Water Discharge Instruction: May shower and wash wound with dial antibacterial soap and water prior to dressing change. Peri-Wound Care Skin Prep Discharge Instruction: Use skin prep as directed Topical Primary Dressing Promogran Prisma Matrix, 4.34 (sq in) (silver collagen) Discharge Instruction: Moisten collagen with saline or hydrogel Secondary Dressing Woven Gauze Sponges 2x2 in Discharge Instruction: Apply over primary dressing, fold in corners to fill the space Zetuvit Plus Silicone Border Dressing 5x5 (in/in) Discharge Instruction: Apply silicone  border over primary dressing as directed. Secured With Compression Wrap Compression Stockings Environmental education officer) Signed: 11/14/2022  12:35:45 PM By: Erenest Blank Entered By: Erenest Blank on 11/12/2022 09:29:25 -------------------------------------------------------------------------------- Vitals Details Patient Name: Date of Service: [name] INS, [name] 11/12/2022 9:30 A M Medical Record Number: [id number] Patient Account Number: [id number] Date of Birth/Sex: Treating RN: 08/12/1951 (73 y.o. F) Primary Care Treyven Lafauci: [name] Other Clinician: Referring Fedor Kazmierski: Treating Kyoko Elsea/Extender: [name], [name]: 116 Vital Signs Time Taken: 09:22 Temperature (F): 97.9 Height (in): 63 Pulse (bpm): 67 Weight (lbs): 176 Respiratory Rate (breaths/min): 18 Body Mass Index (BMI): 31.2 Blood Pressure (mmHg): 151/77 Reference Range: 80 - 120 mg / dl [name], [name] ([id number]) [id number] Page 6 of 6 Electronic Signature(s) Signed: 11/14/2022 12:35:45 PM By: Erenest Blank Entered By: Erenest Blank on 11/12/2022 09:22:51

## 2022-12-09 ENCOUNTER — Other Ambulatory Visit: Payer: Self-pay | Admitting: Hematology & Oncology

## 2022-12-10 ENCOUNTER — Encounter (HOSPITAL_BASED_OUTPATIENT_CLINIC_OR_DEPARTMENT_OTHER): Payer: Medicare Other | Attending: Physician Assistant | Admitting: Physician Assistant

## 2022-12-10 DIAGNOSIS — Y842 Radiological procedure and radiotherapy as the cause of abnormal reaction of the patient, or of later complication, without mention of misadventure at the time of the procedure: Secondary | ICD-10-CM | POA: Insufficient documentation

## 2022-12-10 DIAGNOSIS — L98492 Non-pressure chronic ulcer of skin of other sites with fat layer exposed: Secondary | ICD-10-CM | POA: Diagnosis not present

## 2022-12-10 DIAGNOSIS — L598 Other specified disorders of the skin and subcutaneous tissue related to radiation: Secondary | ICD-10-CM | POA: Diagnosis not present

## 2022-12-10 DIAGNOSIS — Z9221 Personal history of antineoplastic chemotherapy: Secondary | ICD-10-CM | POA: Insufficient documentation

## 2022-12-10 DIAGNOSIS — I10 Essential (primary) hypertension: Secondary | ICD-10-CM | POA: Insufficient documentation

## 2022-12-10 DIAGNOSIS — Z86718 Personal history of other venous thrombosis and embolism: Secondary | ICD-10-CM | POA: Diagnosis not present

## 2022-12-10 DIAGNOSIS — L589 Radiodermatitis, unspecified: Secondary | ICD-10-CM | POA: Insufficient documentation

## 2022-12-11 ENCOUNTER — Encounter: Payer: Self-pay | Admitting: Family

## 2022-12-11 NOTE — Progress Notes (Signed)
BRITNEY, BENEKE ([id number]) [id number] Page 1 of 5 Visit Report for 12/10/2022 Arrival Information Details Patient Name: Date of Service: [name] 12/10/2022 9:30 A M Medical Record Number: [id number] Patient Account Number: [id number] Date of Birth/Sex: Treating RN: Dec 16, 1950 (72 y.o. [name], Meta.Reding Primary Care Kimoni Pickerill: [name] Other Clinician: Referring Ashlin Hidalgo: Treating Domino Holten/Extender: [name], [name]: 120 Visit Information History Since Last Visit Added or deleted any medications: No Patient Arrived: Ambulatory Any new allergies or adverse reactions: No Arrival Time: 09:47 Had a fall or experienced change in No Accompanied By: self activities of daily living that may affect Transfer Assistance: None risk of falls: Patient Identification Verified: Yes Signs or symptoms of abuse/neglect since last visito No Secondary Verification Process Completed: Yes Hospitalized since last visit: No Patient Requires Transmission-Based Precautions: No Implantable device outside of the clinic excluding No Patient Has Alerts: No cellular tissue based products placed in the center since last visit: Has Dressing in Place as Prescribed: Yes Pain Present Now: No Electronic Signature(s) Signed: 12/10/2022 6:07:49 PM By: [name] RN, BSN Entered By: [name] on 12/10/2022 09:47:34 -------------------------------------------------------------------------------- Encounter Discharge Information Details Patient Name: Date of Service: [name], [name] 12/10/2022 9:30 A M Medical Record Number: [id number] Patient Account Number: [id number] Date of Birth/Sex: Treating RN: August 14, 1951 (72 y.o. [name] Primary Care Kalyb Pemble: [name] Other Clinician: Referring Elora Wolter: Treating Nikolas Casher/Extender: [name], [name]: 120 Encounter Discharge Information Items Post  Procedure Vitals Discharge Condition: Stable Temperature (F): 98 Ambulatory Status: Ambulatory Pulse (bpm): 81 Discharge Destination: Home Respiratory Rate (breaths/min): 20 Transportation: Private Auto Blood Pressure (mmHg): 151/78 Accompanied By: self Schedule Follow-up Appointment: Yes Clinical Summary of Care: Electronic Signature(s) Signed: 12/10/2022 6:07:49 PM By: [name] RN, BSN Entered By: [name] on 12/10/2022 [id number] -------------------------------------------------------------------------------- Lower Extremity Assessment Details Patient Name: Date of Service: [name], [name] 12/10/2022 9:30 A M Medical Record Number: [id number] Patient Account Number: [id number] Date of Birth/Sex: Treating RN: 11/09/1950 (72 y.o. [name] Primary Care Jyl Chico: [name] Other Clinician: Referring Honi Name: Treating Latausha Flamm/Extender: [name], [name]: 120 Electronic Signature(s) Signed: 12/10/2022 6:07:49 PM By: [name] RN, BSN Menands, Letta Median ([id number]) [id number] Page 2 of 5 Entered By: [name] on 12/10/2022 09:48:00 -------------------------------------------------------------------------------- Multi-Disciplinary Care Plan Details Patient Name: Date of Service: [name] 12/10/2022 9:30 A M Medical Record Number: [id number] Patient Account Number: [id number] Date of Birth/Sex: Treating RN: [birth year] (72 y.o. [name] Primary Care Tessia Kassin: [name] Other Clinician: Referring Gizelle Whetsel: Treating Tequlia Gonsalves/Extender: [name], [name] in Treatment: [place] reviewed with physician Active Inactive Wound/Skin Impairment Nursing Diagnoses: Impaired tissue integrity Knowledge deficit related to ulceration/compromised skin integrity Goals: Patient/caregiver will verbalize understanding of skin care regimen Date  Initiated: 08/22/2020 Target Resolution Date: 01/09/2023 Goal Status: Active Ulcer/skin breakdown will have a volume reduction of 30% by week 4 Date Initiated: 08/22/2020 Date Inactivated: 10/03/2020 Target Resolution Date: 09/19/2020 Goal Status: Met Ulcer/skin breakdown will have a volume reduction of 50% by week 8 Date Initiated: 06/05/2021 Date Inactivated: 07/17/2021 Target Resolution Date: 07/03/2021 Goal Status: Unmet Unmet Reason: infection Interventions: Assess patient/caregiver ability to obtain necessary supplies Assess patient/caregiver ability to perform ulcer/skin care regimen upon admission and as needed Assess ulceration(s) every visit Treatment Activities: Skin care regimen initiated : 08/22/2020 Topical wound management initiated : 08/22/2020 Notes: 06/05/21: Wound care regimen ongoing. 06/11/22: Wound care regimen  continues, applying skin sub ([place]) Electronic Signature(s) Signed: 12/10/2022 6:07:49 PM By: [name] RN, BSN Entered By: [name] on 12/10/2022 09:53:30 -------------------------------------------------------------------------------- Pain Assessment Details Patient Name: Date of Service: [name], [name] 12/10/2022 9:30 A M Medical Record Number: [id number] Patient Account Number: [id number] Date of Birth/Sex: Treating RN: 02-07-1951 (72 y.o. [name] Primary Care Terrie Grajales: [name] Other Clinician: Referring Annali Lybrand: Treating Daisee Centner/Extender: [name], [name]: 120 Active Problems Location of Pain Severity and Description of Pain Patient Has Paino No Site Locations Rate the pain. SHADIAMON, OSMON ([id number]) [id number] Page 3 of 5 Rate the pain. Current Pain Level: 0 Pain Management and Medication Current Pain Management: Medication: No Cold Application: No Rest: No Massage: No Activity: No T.E.N.S.: No Heat Application: No Leg drop or elevation: No Is  the Current Pain Management Adequate: Adequate How does your wound impact your activities of daily livingo Sleep: No Bathing: No Appetite: No Relationship With Others: No Bladder Continence: No Emotions: No Bowel Continence: No Work: No Toileting: No Drive: No Dressing: No Hobbies: No Engineer, maintenance) Signed: 12/10/2022 6:07:49 PM By: [name] RN, BSN Entered By: [name] on 12/10/2022 09:47:56 -------------------------------------------------------------------------------- Patient/Caregiver Education Details Patient Name: Date of Service: [name], [name] 2/28/2024andnbsp9:30 A M Medical Record Number: [id number] Patient Account Number: [id number] Date of Birth/Gender: Treating RN: 1951/04/12 (72 y.o. [name] Primary Care Physician: [name] Other Clinician: Referring Physician: Treating Physician/Extender: [name], [name] in Treatment: 32 Education Assessment Education Provided To: Patient Education Topics Provided Wound/Skin Impairment: Handouts: Caring for Your Ulcer Methods: Explain/Verbal Responses: Reinforcements needed Electronic Signature(s) Signed: 12/10/2022 6:07:49 PM By: [name] RN, BSN Entered By: [name] on 12/10/2022 09:53:43 -------------------------------------------------------------------------------- Wound Assessment Details Patient Name: Date of Service: [name], [name] 12/10/2022 9:30 A Johnney Ou, Letta Median ([id number] Page 4 of 5 Medical Record Number: [id number] Patient Account Number: [id number] Date of Birth/Sex: Treating RN: [birth year] (72 y.o. [name] Primary Care Noland Pizano: [name] Other Clinician: Referring Tawan Corkern: Treating Berlene Dixson/Extender: [name], [name]: 120 Wound Status Wound Number: 1 Primary Soft Tissue Radionecrosis Etiology: Wound Location: Right Breast (mastectomy  site) Wound Open Wounding Event: Radiation Burn Status: Date Acquired: 07/26/2020 Comorbid Anemia, Lymphedema, Deep Vein Thrombosis, Hypertension, Weeks Of Treatment: 120 History: Peripheral Venous Disease, Osteoarthritis, Received Clustered Wound: No Chemotherapy, Received Radiation, Confinement Anxiety Photos Wound Measurements Length: (cm) 1.6 Width: (cm) 2.8 Depth: (cm) 0.3 Area: (cm) 3.519 Volume: (cm) 1.056 % Reduction in Area: -129.7% % Reduction in Volume: -590.2% Epithelialization: None Tunneling: No Undermining: Yes Starting Position (o'clock): 10 Ending Position (o'clock): 12 Maximum Distance: (cm) 0.5 Wound Description Classification: Full Thickness Without Exposed Suppor Wound Margin: Distinct, outline attached Exudate Amount: Medium Exudate Type: Serosanguineous Exudate Color: red, brown t Structures Foul Odor After Cleansing: No Slough/Fibrino Yes Wound Bed Granulation Amount: Large (67-100%) Exposed Structure Granulation Quality: Red Fascia Exposed: No Necrotic Amount: Small (1-33%) Fat Layer (Subcutaneous Tissue) Exposed: Yes Necrotic Quality: Adherent Slough Tendon Exposed: No Muscle Exposed: No Joint Exposed: No Bone Exposed: No Periwound Skin Texture Texture Color No Abnormalities Noted: Yes No Abnormalities Noted: No Atrophie Blanche: No Moisture Cyanosis: No No Abnormalities Noted: Yes Ecchymosis: No Erythema: No Hemosiderin Staining: No Mottled: No Pallor: No Rubor: No Temperature / Pain Temperature: No Abnormality Treatment Notes Wound #1 (Breast (mastectomy site)) Wound Laterality: Right [name] ([id number] Page 5 of 5 Cleanser Wound Cleanser Discharge Instruction: Cleanse the  wound with wound cleanser prior to applying a clean dressing using gauze sponges, not tissue or cotton balls. [facility] Ancillary Kit - 15 Day Supply Discharge Instruction: Use supplies as instructed; Kit  contains: (15) Saline Bullets; (15) 3x3 Gauze; 15 pr Gloves Soap and Water Discharge Instruction: May shower and wash wound with dial antibacterial soap and water prior to dressing change. Peri-Wound Care Skin Prep Discharge Instruction: Use skin prep as directed Topical Primary Dressing Promogran Prisma Matrix, 4.34 (sq in) (silver collagen) Discharge Instruction: Moisten collagen with saline or hydrogel Secondary Dressing Woven Gauze Sponge, Non-Sterile 4x4 in Discharge Instruction: Apply over primary dressing as directed. Woven Gauze Sponges 2x2 in Discharge Instruction: Apply over primary dressing, fold in corners to fill the space Zetuvit Plus Silicone Border Dressing 5x5 (in/in) Discharge Instruction: Apply silicone border over primary dressing as directed. Secured With Compression Wrap Compression Stockings Environmental education officer) Signed: 12/10/2022 6:07:49 PM By: [name] RN, BSN Entered By: [name] on 12/10/2022 09:52:03 -------------------------------------------------------------------------------- Vitals Details Patient Name: Date of Service: [name], [name] 12/10/2022 9:30 A M Medical Record Number: [id number] Patient Account Number: [id number] Date of Birth/Sex: Treating RN: Sep 26, 1951 (72 y.o. [name], [name] Primary Care Charmian Forbis: [name] Other Clinician: Referring Moon Budde: Treating Dezzie Badilla/Extender: [name], [name]: 120 Vital Signs Time Taken: 09:47 Temperature (F): 98 Height (in): 63 Pulse (bpm): 81 Weight (lbs): 176 Respiratory Rate (breaths/min): 20 Body Mass Index (BMI): 31.2 Blood Pressure (mmHg): 151/78 Reference Range: 80 - 120 mg / dl Electronic Signature(s) Signed: 12/10/2022 6:07:49 PM By: [name] RN, BSN Entered By: [name] on 12/10/2022 09:47:48

## 2022-12-11 NOTE — Progress Notes (Addendum)
[name], [name] ([id number]) [id number] Page 1 of 10 Visit Report for 12/10/2022 Chief Complaint Document Details Patient Name: Date of Service: [name] YE 12/10/2022 9:30 A M Medical Record Number: [id number] Patient Account Number: [id number] Date of Birth/Sex: Treating RN: 07-Aug-1951 (72 y.o. 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 [name], [name] Primary Care Provider: [name] Other Clinician: Referring Provider: Treating Provider/Extender: [name], [name]: 120 Debridement Performed for Assessment: Wound #1 Right Breast (mastectomy site) Performed By: Physician Worthy Keeler, PA Debridement Type: Debridement Level of Consciousness (Pre-procedure): Awake and Alert Pre-procedure Verification/Time Out Yes - 10:00 Taken: Start Time: 10:01 Pain Control: Lidocaine 4% T opical Solution T Area Debrided (L x W): otal 1.6 (cm) x 2.8 (cm) = 4.48 (cm) Tissue and other material debrided: Viable, Non-Viable, Slough, Subcutaneous, Skin: Dermis , Skin: Epidermis, Slough Level: Skin/Subcutaneous Tissue Debridement Description: Excisional Instrument: Curette Bleeding: Minimum Hemostasis Achieved: Pressure End Time: 10:04 Procedural Pain: 0 Post Procedural Pain:  0 Response to Treatment: Procedure was tolerated well Level of Consciousness (Post- Awake and Alert procedure): Post Debridement Measurements of Total Wound Length: (cm) 1.6 Width: (cm) 2.8 Depth: (cm) 0.3 Volume: (cm) 1.056 Character of Wound/Ulcer Post Debridement: Improved Post Procedure Diagnosis Same as Pre-procedure Electronic Signature(s) Signed: 12/10/2022 3:56:54 PM By: Worthy Keeler PA-C Signed: 12/10/2022 6:07:49 PM By: [name] RN, BSN Entered By: [name] on 12/10/2022 10:05:03 [name] ([id number]) [id number] Page 2 of 10 -------------------------------------------------------------------------------- HPI Details Patient Name: Date of Service: [name], [name] 12/10/2022 9:30 A M Medical Record Number: [id number] Patient Account Number: [id number] Date of Birth/Sex: Treating RN: [birth year] (72 y.o. F) Primary Care Provider: [name] Other Clinician: Referring Provider: Treating Provider/Extender: [name], [name]: 120 History of Present Illness HPI Description: 08/22/2020 upon evaluation today patient actually appears to be doing somewhat poorly in regard to her mastectomy site on the right chest wall. She had a mastectomy initially in 1998. She had chemotherapy only no radiation following. In 2002 she subsequently did undergo radiation for the first time and then subsequently in 2012 had repeat radiation after having had a finding that was consistent with a relapse of the cancer. 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 She also has radiation damage to the skin over the right chest wall where she had the mastectomy. She was referred to [place] by Dr. Matthew Saras in [facility] and her oncologist is Dr. Burney Gauze. Subsequently I want to make sure before we delve into this more deeply that the patient does not have any issues with a  return of cancer that needs to be managed by them. 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 Nonetheless she does not remember exactly when her last PET scan was but it was not too recently she tells me. She does have a  history of a DVT in the right leg in 2013. The patient does have hypertension as well. She sees her oncologist next on December 6. 09/12/2020 on evaluation today patient actually appears to be doing excellent in regard to her wound under the right breast location. Currently this is measuring smaller in general seems to be doing very well. She tells me that the collagen is doing a good job and that the dressing that she has been putting on is not causing any troubles as far pulling on her skin or scar tissue is concerned all of which is excellent news. 10/03/2020 upon evaluation today patient appears to be doing well with regard to her surgical site from her mastectomy on the right breast region. She tells me that she has had very little bleeding nothing seems to be sticking badly and in general she has been extremely pleased with where things stand today. No fevers, chills, nausea, vomiting, or diarrhea. 10/24/2020 upon evaluation today patient actually appears to be doing excellent in regard to her wound. There is just a very small area still open and to be honest there was minimal slough noted on the surface of the wound nothing that requires sharp debridement. In general I feel like she is actually doing quite well and overall I am pleased. I do think we may switch to silver alginate dressing and also contemplating using a AandE ointment in order to help keep everything moist and the scar tissue region while the alginate keeps it dry enough to heal 11/14/2020 upon evaluation today patient appears to be doing well at this point in regard to  her wound. I do feel like that she is making good progress again this is a very difficult region over the surgical site of the right chest wall at the site of mastectomy. Nonetheless I think that we are just a very small area still open I think alginate is doing a good job helping to dry this up and I would recommend this such that we continue with this. 01/02/2021 on evaluation today patient's wound actually appears to be doing decently well. There does not appear to be any signs of active infection which is great news. She does have some slight slough buildup with biofilm on the surface of the wound mainly more biofilm. Nonetheless I was able to gently remove this with saline and gauze as well as a sterile Q-tip. She tolerated that today without complication. 01/30/2021 upon evaluation today patient appears to be doing well with regard to her wound although she still has quite a bit of trouble getting this to close I think the scar tissue and radiation damage is quite significant here unfortunately. There does not appear to be any signs of active infection which is great news. No fevers, chills, nausea, vomiting, or diarrhea. 02/27/2021 upon evaluation today patient appears to be doing excellent in regard to her wound. She has been tolerating the dressing changes without complication and in general I am extremely pleased with where things stand today. There does not appear to be any signs of infection which is great news. No fevers, chills, nausea, vomiting, or diarrhea. With that being said I do think that she still developing some slough buildup. I did discuss with her today the possibility of proceeding with HBO therapy. We have had this discussion before but she really is not quite committed to wanting to do that much and spend that much time in the chamber. She wants to still think about it. 03/27/2021  upon evaluation today patient appears to be doing about the same in regard to her wound. She still  developing a lot of slough buildup on the surface of the wound. I did try to clean that away to some degree today. She tolerated that without any pain or complication. Subsequently I am thinking we may want to switch over to [facility] see if this may do better for her. Patient is in agreement with giving that a trial. 05/01/2021 upon evaluation today patient appears to be doing about the same in regard to her wounds. She has been tolerating the dressing changes without complication. Fortunately there does not appear to be any signs of active infection at this time which is great news. No fevers, chills, nausea, vomiting, or diarrhea.. 06/05/2021 upon evaluation today patient appears to be doing pretty well currently in regard to her wound at the mastectomy site right chest wall. Overall since we switch back to the alginate she tells me things have significantly improved she is much happier with where things stand currently. Fortunately there does not appear to be any signs of active infection which is great news. No fevers, chills, nausea, vomiting, or diarrhea. 07/10/2021 upon evaluation today patient unfortunately does not appear to be doing nearly as well as what she previously was. There does not appear to be any evidence of new epithelial growth she has a lot of necrotic tissue the base of the wound this is much more significant than what we previously noted. She also has been having increased pain and increased drainage. In general I am very concerned about this especially in light of the fact that she does have a history of breast cancer I want to ensure that were not looking at any cancerous type lesion at this point. Otherwise also think she does need some debridement we did do MolecuLight scanning today. 07/17/2021 upon evaluation today patient appears to be doing well with regard to her wound compared to last week although she still having some erythema I am concerned in this regard. I do think  that she fortunately had a negative biopsy which is great news unfortunately she did have Pseudomonas noted as a bacteria present in the culture which I think is good and need to be addressed with Levaquin. I Minna send that into the pharmacy today. We did repeat the MolecuLight screening. 07/31/2021 upon evaluation today patient's wound is actually showing signs of improvement which is great news. Fortunately there does not appear to be any signs of infection currently locally nor systemically and I am very pleased in that regard. With that being said the patient is having some issues here with continued necrotic tissue I think packing with the Dakin's moistened gauze dressing is still in the right leg ago. 08/14/2021 upon evaluation today patient appears to be doing well with regard to her wound all things considered I do not see any signs of significant infection which is great news. Fortunately there does not appear to be any evidence of infection which is also great news. In general I think that the patient is tolerating the dressing changes well. We been using a Dakin's moistened gauze. 08/28/2021 upon evaluation today patient appears to be doing well with regard to her wound. Worsening signs of definite improvement which is great news and overall very pleased with where we stand today. There is no signs of inflamed tissue or infection which is great as well and I think the Dakin's is doing a great job here. [name],  [name] ([id number] Page 3 of 10 09/11/2021 upon evaluation today patient appears to be doing well with regard to her wound all things considered. I am can perform some debridement today to clear away some of the necrotic debris with that being said overall I think that she is making decently good progress here which is great news. There does not appear to be any signs of active infection also good news. That in fact is probably the best news in her  mind. 09/25/2021 upon evaluation today patient appears to be doing decently well in regard to her wound. Overall I do not see any signs of infection and I think she is doing quite well. I do believe that we are making headway here although is very slow due to the scarring and the depth of the wound this is a significantly deep wound. 10/16/2021 upon evaluation today patient appears to be doing about the same in regard to the wound on her right chest wall. Fortunately there does not appear to be any signs of active infection locally nor systemically which is great news. With that being said this still was not cleaning up quite as quickly as I would like to see. Fortunately I think that we can definitely give the Santyl another try we tried this in the past and unfortunately did not have a good result but again I think she was infected at that time as well which was part of the issue. Nonetheless I think currently that we will be able to go ahead and see what we can do about getting this little cleaner with the Santyl. 11/06/2021 upon evaluation today patient appears to be doing well with regard to her wound. This actually appears to be a little bit better with the Santyl I am happy in that regard. Fortunately I do not see any signs of active infection at this time. No fevers, chills, nausea, vomiting, or diarrhea. 12/04/2021 upon evaluation patient appears to be doing well with the Santyl at this point. I am very pleased with where we stand and I think that she is making good progress here. Fortunately I do not see any evidence of active infection locally or systemically at this time which is great news. No fevers, chills, nausea, vomiting, or diarrhea. 01/01/2022 upon evaluation today patient actually is making excellent progress with the Santyl. I am extremely pleased with where we stand today. I do not see any signs of infection. 01-29-2022 upon evaluation today patient appears to be doing well with  regard to her wound. This is showing signs of some improvement. It is a little less deep than what it was. Size wise is also slightly smaller but again there still is a significant wound in this area. Fortunately I do not see any evidence of infection and she is doing quite well. 02-26-2022 upon evaluation today patient appears to be doing well with regard to her wound. In fact this is actually the best that I have seen in a very long time. I do not see any evidence of active infection at this time locally or systemically which is great news and overall I think we are on the right track. Nonetheless I do believe that the patient is continuing to show evidence of improvement with the Santyl week by week. 03-26-2022 upon evaluation today patient appears to be doing well with regard to the wound on her right mastectomy site. She is actually showing signs of excellent improvement in fact there was some bleeding just  with a little bit of light cleaning over the area. Fortunately I do not see any signs of active infection locally or systemically at this time which is great news. 04-30-2022 upon evaluation today patient appears to be doing well currently in regard to her wound. She is actually showing some signs of improvement here which is great news. Still this is very very slow. Subsequently I do believe that she may benefit from the use of [place] topical antibiotics for short amount of time before applying a skin substitute I think EpiCord could be doing very well for her as far as that is concerned. I discussed that with her today. Fortunately I do not see any evidence of active infection locally or systemically at this time which is great news. No fevers, chills, nausea, vomiting, or diarrhea. 05-28-2022 upon evaluation today patient's wound is actually showing signs of significant improvement. Fortunately I do not see any evidence of infection at this time which is great news. No fevers, chills, nausea,  vomiting, or diarrhea. With that being said I do believe that the patient is tolerating the dressing changes without complication. We been using the [facility] which I think is helpful. We do have approval for the [place]. 06-11-2022 upon evaluation today patient appears to be doing well with regard to her wound she is actually here today for the first application of [place] which I think is good to be beneficial for her. I do think that we will probably be able to keep this in place without can be the one thing of question to be honest. 06-18-2022 upon evaluation today patient actually appears to be doing excellent today. She has 1 treatment of the EpiCord underway and the wound surface already looks much better. This will be EpiCord #2 today. Fortunately I think we are on the right track here. 06-25-2022 upon evaluation today patient appears to be making good progress and overall the patient seems to have tolerated EpiCord without any complication. I do feel like that the wound is improving quite significantly. This is EpiCord #3 today. 07-02-2021 upon evaluation today patient's wound is actually showing signs of improvement. I am actually very pleased with where we stand we have been seeing some improvements in size overall and I think that we are still in the right track although there is a little bit of need for sharp debridement today to clearway some of the necrotic debris and remaining EpiCord which is actually still somewhat adhered to the wound bed. Overall I am extremely pleased though with where we stand. This is EpiCord #4 today. Upon evaluation today patient appears to be doing well with regard to her wound the EpiCord is definitely helping and does seem to be improving the overall status of the wound the surface is dramatically improved compared to what it was at the start of this. The tissue is actually becoming more red than it is just a pale pink and to be honest some of the did not even  appear to be pink in the start. Nonetheless I am very pleased with where we stand currently. No fevers, chills, nausea, vomiting, or diarrhea. 07-16-2022 upon evaluation today patient appears to be doing well currently in regard to her wound she is doing well with the [place] this is application #6 today. 07-23-2022 upon evaluation today patient's wound actually showing signs of excellent improvement which is great news. Fortunately I think were making progress again this is a very scarred area which now has a pretty good  vascular supply which is great news. Overall I think that she is making great progress. This is EpiCord #7 today 10/18; Epicort No. 8. Very minimally smaller. Wounds on the right breast mastectomy site complicated by soft tissue radiation damage. Unfortunately she lives in [place], 5 days a week transport would be impossible for her to arrange 08-06-2022 upon evaluation today patient's wound is showing signs again of minimal improvement with regard to the wound bed again this is something that would probably respond well to hyperbaric oxygen therapy and I think should we can probably get this approved but she is just not able to make the transportation from [place] here daily for the 16-monthperiod of time. Nonetheless I do think that she seems to be doing much better with regard to the surface of the wound and I am pleased that regard I am going to go ahead and continue with the [place] is #9 application today. 08-13-2022 upon evaluation today patient's wound again is showing signs of improved quality to the tissue in the base of the wound. Fortunately I do not see any evidence of infection which is great news and overall I am extremely pleased in that regard. In general I do believe that the patient is making progress here. No fevers, chills, nausea, vomiting, or diarrhea. She is here today for EpiCord application #[id number] patient with a wound on the lower part of the right  breast area. She is apparently had a nice response to epi cord but she has completed this. It is difficult not to look over this lady's history and look at the wound and wonder about the known benefits of hyperbaric oxygen for soft tissue radionecrosis however she is unable to make [place] upon evaluation today patient appears to be doing well in regard to her wound. She has been tolerating the dressing changes without complication wheezing using the silver collagen over the past week and she tolerated that without complication. Fortunately I see no evidence of active infection locally or systemically at this time. [name], [name]([id number] Page 4 of 10 09-17-2022 upon evaluation today patient appears to be doing well currently in regard to her wound. She has been tolerating the dressing changes without complication. Fortunately there does not appear to be any signs of active infection locally or systemically at this time which is great news. No fevers, chills, nausea, vomiting, or diarrhea. 10-15-2022 upon evaluation today patient appears to be doing well currently in regard to her wound. This is showing signs of good improvement which is great news and overall I am extremely pleased with where we stand today. Fortunately there does not appear to be any signs of active infection at this time which is great news as well. No fevers, chills, nausea, vomiting, or diarrhea. 11-12-2022 upon evaluation today patient appears to be doing pretty well in regard to her wound. She has been tolerating the dressing changes without complication and fortunately there does not appear to be any signs of active infection locally nor systemically which is great news. No fevers, chills, nausea, vomiting, or diarrhea. 12-10-2022 upon evaluation today patient appears to be doing well currently in regard to her wound. She has been  tolerating the dressing changes without complication. Fortunately there does not appear to be any signs of active infection locally nor systemically which is great news. No fevers, chills, nausea, vomiting, or diarrhea. Electronic Signature(s) Signed: 12/10/2022 3:42:03 PM By: SWorthy KeelerPA-C Entered By:  Worthy Keeler on 12/10/2022 15:42:03 -------------------------------------------------------------------------------- Physical Exam Details Patient Name: Date of Service: [name], [name] 12/10/2022 9:30 A M Medical Record Number: [id number] Patient Account Number: [id number] Date of Birth/Sex: Treating RN: Feb 24, 1951 (72 y.o. F) Primary Care Provider: [name] Other Clinician: Referring Provider: Treating Provider/Extender: [name], [name]: 2 Constitutional [name] and well-hydrated in no acute distress. Respiratory normal breathing without difficulty. Psychiatric this patient is able to make decisions and demonstrates good insight into disease process. Alert and Oriented x 3. pleasant and cooperative. Notes Upon inspection patient's wound bed actually showed signs of good granulation epithelization at this point. Fortunately I do not see any signs of worsening overall and I am very pleased with where we are today. Electronic Signature(s) Signed: 12/10/2022 3:42:16 PM By: Worthy Keeler PA-C Entered By: Worthy Keeler on 12/10/2022 15:42:16 -------------------------------------------------------------------------------- Physician Orders Details Patient Name: Date of Service: [name], [name] 12/10/2022 9:30 A M Medical Record Number: [id number] Patient Account Number: [id number] Date of Birth/Sex: Treating RN: 1950/11/28 (72 y.o. [name] Primary Care Provider: [name] Other Clinician: Referring Provider: Treating Provider/Extender: [name], [name]: 120 Verbal / Phone  Orders: No Diagnosis Coding ICD-10 Coding Code Description [phone] Non-pressure chronic ulcer of skin of other sites with fat layer exposed L58.9 Radiodermatitis, unspecified L59.8 Other specified disorders of the skin and subcutaneous tissue related to radiation I10 Essential (primary) hypertension Follow-up Appointments Return appointment in 1 month. Burman [facility]smith, [place] on Wednesday's Room 8 01/07/2023 [name] ([id number]) [id number] Page 5 of 10 Anesthetic (In clinic) Topical Lidocaine 5% applied to wound bed Cellular or Tissue Based Products daptic or Mepitel. (DO NOT REMOVE). - Cellular or Tissue Based Product applied to wound bed, secured with steri-strips, cover with A Epicord #1 06/11/22, #2 06/18/22, #3 06/25/22, #4 07/02/22, #5 07/09/22, #6 07/16/22, Epicord #7 07/23/22, Epicord #8 07/30/22, Epicord #9 08/06/2022. Epicord # 10 08/13/22 - no more after [id number] application, per PA we will see how she does after this with collagen for now. Additional Orders / Instructions Follow Nutritious Diet Wound Treatment Wound #1 - Breast (mastectomy site) Wound Laterality: Right Cleanser: Wound Cleanser (Generic) Every Other Day/30 Days Discharge Instructions: Cleanse the wound with wound cleanser prior to applying a clean dressing using gauze sponges, not tissue or cotton balls. Cleanser: [place] Ancillary Kit - 15 Day Supply (DME) (Generic) Every Other Day/30 Days Discharge Instructions: Use supplies as instructed; Kit contains: (15) Saline Bullets; (15) 3x3 Gauze; 15 pr Gloves Cleanser: Soap and Water Every Other Day/30 Days Discharge Instructions: May shower and wash wound with dial antibacterial soap and water prior to dressing change. Peri-Wound Care: Skin Prep (DME) (Generic) Every Other Day/30 Days Discharge Instructions: Use skin prep as directed Prim Dressing: Promogran Prisma Matrix, 4.34 (sq in) (silver collagen) (Generic) Every Other Day/30 Days ary Discharge  Instructions: Moisten collagen with saline or hydrogel Secondary Dressing: Woven Gauze Sponge, Non-Sterile 4x4 in (DME) (Generic) Every Other Day/30 Days Discharge Instructions: Apply over primary dressing as directed. Secondary Dressing: Woven Gauze Sponges 2x2 in (DME) (Generic) Every Other Day/30 Days Discharge Instructions: Apply over primary dressing, fold in corners to fill the space Secondary Dressing: Zetuvit Plus Silicone Border Dressing 5x5 (in/in) (DME) (Generic) Every Other Day/30 Days Discharge Instructions: Apply silicone border over primary dressing as directed. Electronic Signature(s) Signed: 12/10/2022 3:56:54 PM By: Worthy Keeler PA-C Signed: 12/10/2022 6:07:49 PM By: [name] RN, BSN Entered By:  [name] on 12/10/2022 10:03:56 -------------------------------------------------------------------------------- Problem List Details Patient Name: Date of Service: [name] YE 12/10/2022 9:30 A M Medical Record Number: [id number] Patient Account Number: [id number] Date of Birth/Sex: Treating RN: Apr 02, 1951 (72 y.o. [name] Primary Care Provider: [name] Other Clinician: Referring Provider: Treating Provider/Extender: [name], [name]: 120 Active Problems ICD-10 Encounter Code Description Active Date MDM Diagnosis [phone] Non-pressure chronic ulcer of skin of other sites with fat layer exposed 08/22/2020 No Yes L58.9 Radiodermatitis, unspecified 08/22/2020 No Yes L59.8 Other specified disorders of the skin and subcutaneous tissue related to 08/22/2020 No Yes radiation I10 Essential (primary) hypertension 08/22/2020 No Yes [name], [name] ([id number]) [phone].pdf Page 6 of 10 Inactive Problems Resolved Problems Electronic Signature(s) Signed: 12/10/2022 9:59:17 AM By: Worthy Keeler PA-C Entered By: Worthy Keeler on 12/10/2022  09:59:17 -------------------------------------------------------------------------------- Progress Note Details Patient Name: Date of Service: [name], [name] 12/10/2022 9:30 A M Medical Record Number: [id number] Patient Account Number: [id number] Date of Birth/Sex: Treating RN: Feb 20, 1951 (72 y.o. 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 She had a mastectomy initially in 1998. She had chemotherapy only no radiation following. In 2002 she subsequently did undergo radiation for the first time and then subsequently in 2012 had repeat radiation after having had a finding that was consistent with a relapse of the cancer. 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 She also has radiation damage to the skin over the right chest wall where she had the mastectomy. She was referred to [place] by Dr. Matthew Saras in [facility] and her oncologist is Dr. Burney Gauze. Subsequently I want to make sure before we delve into this more deeply that the patient does not have any issues with a return of cancer that needs to be managed by them. 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 Nonetheless she does not remember exactly when her last PET scan was but it was not too recently she tells me. 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 The patient does have hypertension as well. She sees her  oncologist next on December 6. 09/12/2020 on evaluation today patient actually appears to be doing excellent in regard to her wound under the right breast location. Currently this is measuring smaller in general seems to be doing very well. She tells me that the collagen is doing a good job and that the dressing that she has been putting on is not causing any troubles as far pulling on her skin or scar tissue is concerned all of which is excellent news. 10/03/2020 upon evaluation today patient appears to be doing well with regard to her surgical site from her mastectomy on the right breast region. She tells me that she has had very little bleeding nothing seems to be sticking badly and in general she has been extremely pleased with where things stand today. No fevers, chills, nausea, vomiting, or diarrhea. 10/24/2020 upon evaluation today patient actually appears  to be doing excellent in regard to her wound. There is just a very small area still open and to be honest there was minimal slough noted on the surface of the wound nothing that requires sharp debridement. In general I feel like she is actually doing quite well and overall I am pleased. I do think we may switch to silver alginate dressing and also contemplating using a AandE ointment in order to help keep everything moist and the scar tissue region while the alginate keeps it dry enough to heal 11/14/2020 upon evaluation today patient appears to be doing well at this point in regard to her wound. I do feel like that she is making good progress again this is a very difficult region over the surgical site of the right chest wall at the site of mastectomy. Nonetheless I think that we are just a very small area still open I think alginate is doing a good job helping to dry this up and I would recommend this such that we continue with this. 01/02/2021 on evaluation today patient's wound actually appears to be doing decently well. There does not appear  to be any signs of active infection which is great news. She does have some slight slough buildup with biofilm on the surface of the wound mainly more biofilm. Nonetheless I was able to gently remove this with saline and gauze as well as a sterile Q-tip. She tolerated that today without complication. 01/30/2021 upon evaluation today patient appears to be doing well with regard to her wound although she still has quite a bit of trouble getting this to close I think the scar tissue and radiation damage is quite significant here unfortunately. There does not appear to be any signs of active infection which is great news. No fevers, chills, nausea, vomiting, or diarrhea. 02/27/2021 upon evaluation today patient appears to be doing excellent in regard to her wound. She has been tolerating the dressing changes without complication and in general I am extremely pleased with where things stand today. There does not appear to be any signs of infection which is great news. No fevers, chills, nausea, vomiting, or diarrhea. With that being said I do think that she still developing some slough buildup. I did discuss with her today the possibility of proceeding with HBO therapy. We have had this discussion before but she really is not quite committed to wanting to do that much and spend that much time in the chamber. She wants to still think about it. 03/27/2021 upon evaluation today patient appears to be doing about the same in regard to her wound. She still developing a lot of slough buildup on the surface of the wound. I did try to clean that away to some degree today. She tolerated that without any pain or complication. Subsequently I am thinking we may want to switch over to [facility] see if this may do better for her. Patient is in agreement with giving that a trial. 05/01/2021 upon evaluation today patient appears to be doing about the same in regard to her wounds. She has been tolerating the dressing changes  without complication. Fortunately there does not appear to be any signs of active infection at this time which is great news. No fevers, chills, nausea, vomiting, or diarrhea.. 06/05/2021 upon evaluation today patient appears to be doing pretty well currently in regard to her wound at the mastectomy site right chest wall. Overall since we switch back to the alginate she tells me things have significantly  improved she is much happier with where things stand currently. Fortunately there does not appear to be any signs of active infection which is great news. No fevers, chills, nausea, vomiting, or diarrhea. [name], [name] ([id number]) [id number] Page 7 of 10 07/10/2021 upon evaluation today patient unfortunately does not appear to be doing nearly as well as what she previously was. There does not appear to be any evidence of new epithelial growth she has a lot of necrotic tissue the base of the wound this is much more significant than what we previously noted. She also has been having increased pain and increased drainage. In general I am very concerned about this especially in light of the fact that she does have a history of breast cancer I want to ensure that were not looking at any cancerous type lesion at this point. Otherwise also think she does need some debridement we did do MolecuLight scanning today. 07/17/2021 upon evaluation today patient appears to be doing well with regard to her wound compared to last week although she still having some erythema I am concerned in this regard. I do think that she fortunately had a negative biopsy which is great news unfortunately she did have Pseudomonas noted as a bacteria present in the culture which I think is good and need to be addressed with Levaquin. I Minna send that into the pharmacy today. We did repeat the MolecuLight screening. 07/31/2021 upon evaluation today patient's wound is actually showing signs of improvement which  is great news. Fortunately there does not appear to be any signs of infection currently locally nor systemically and I am very pleased in that regard. With that being said the patient is having some issues here with continued necrotic tissue I think packing with the Dakin's moistened gauze dressing is still in the right leg ago. 08/14/2021 upon evaluation today patient appears to be doing well with regard to her wound all things considered I do not see any signs of significant infection which is great news. Fortunately there does not appear to be any evidence of infection which is also great news. In general I think that the patient is tolerating the dressing changes well. We been using a Dakin's moistened gauze. 08/28/2021 upon evaluation today patient appears to be doing well with regard to her wound. Worsening signs of definite improvement which is great news and overall very pleased with where we stand today. There is no signs of inflamed tissue or infection which is great as well and I think the Dakin's is doing a great job here. 09/11/2021 upon evaluation today patient appears to be doing well with regard to her wound all things considered. I am can perform some debridement today to clear away some of the necrotic debris with that being said overall I think that she is making decently good progress here which is great news. There does not appear to be any signs of active infection also good news. That in fact is probably the best news in her mind. 09/25/2021 upon evaluation today patient appears to be doing decently well in regard to her wound. Overall I do not see any signs of infection and I think she is doing quite well. I do believe that we are making headway here although is very slow due to the scarring and the depth of the wound this is a significantly deep wound. 10/16/2021 upon evaluation today patient appears to be doing about the same in regard to the wound on her right chest wall.  Fortunately there does not appear to be any signs of active infection locally nor systemically which is great news. With that being said this still was not cleaning up quite as quickly as I would like to see. Fortunately I think that we can definitely give the Santyl another try we tried this in the past and unfortunately did not have a good result but again I think she was infected at that time as well which was part of the issue. Nonetheless I think currently that we will be able to go ahead and see what we can do about getting this little cleaner with the Santyl. 11/06/2021 upon evaluation today patient appears to be doing well with regard to her wound. This actually appears to be a little bit better with the Santyl I am happy in that regard. Fortunately I do not see any signs of active infection at this time. No fevers, chills, nausea, vomiting, or diarrhea. 12/04/2021 upon evaluation patient appears to be doing well with the Santyl at this point. I am very pleased with where we stand and I think that she is making good progress here. Fortunately I do not see any evidence of active infection locally or systemically at this time which is great news. No fevers, chills, nausea, vomiting, or diarrhea. 01/01/2022 upon evaluation today patient actually is making excellent progress with the Santyl. I am extremely pleased with where we stand today. I do not see any signs of infection. 01-29-2022 upon evaluation today patient appears to be doing well with regard to her wound. This is showing signs of some improvement. It is a little less deep than what it was. Size wise is also slightly smaller but again there still is a significant wound in this area. Fortunately I do not see any evidence of infection and she is doing quite well. 02-26-2022 upon evaluation today patient appears to be doing well with regard to her wound. In fact this is actually the best that I have seen in a very long time. I do not see any  evidence of active infection at this time locally or systemically which is great news and overall I think we are on the right track. Nonetheless I do believe that the patient is continuing to show evidence of improvement with the Santyl week by week. 03-26-2022 upon evaluation today patient appears to be doing well with regard to the wound on her right mastectomy site. She is actually showing signs of excellent improvement in fact there was some bleeding just with a little bit of light cleaning over the area. Fortunately I do not see any signs of active infection locally or systemically at this time which is great news. 04-30-2022 upon evaluation today patient appears to be doing well currently in regard to her wound. She is actually showing some signs of improvement here which is great news. Still this is very very slow. Subsequently I do believe that she may benefit from the use of [place] topical antibiotics for short amount of time before applying a skin substitute I think EpiCord could be doing very well for her as far as that is concerned. I discussed that with her today. Fortunately I do not see any evidence of active infection locally or systemically at this time which is great news. No fevers, chills, nausea, vomiting, or diarrhea. 05-28-2022 upon evaluation today patient's wound is actually showing signs of significant improvement. Fortunately I do not see any evidence of infection at this time which is great news. No  fevers, chills, nausea, vomiting, or diarrhea. With that being said I do believe that the patient is tolerating the dressing changes without complication. We been using the [facility] which I think is helpful. We do have approval for the [place]. 06-11-2022 upon evaluation today patient appears to be doing well with regard to her wound she is actually here today for the first application of [place] which I think is good to be beneficial for her. I do think that we will probably be  able to keep this in place without can be the one thing of question to be honest. 06-18-2022 upon evaluation today patient actually appears to be doing excellent today. She has 1 treatment of the EpiCord underway and the wound surface already looks much better. This will be EpiCord #2 today. Fortunately I think we are on the right track here. 06-25-2022 upon evaluation today patient appears to be making good progress and overall the patient seems to have tolerated EpiCord without any complication. I do feel like that the wound is improving quite significantly. This is EpiCord #3 today. 07-02-2021 upon evaluation today patient's wound is actually showing signs of improvement. I am actually very pleased with where we stand we have been seeing some improvements in size overall and I think that we are still in the right track although there is a little bit of need for sharp debridement today to clearway some of the necrotic debris and remaining EpiCord which is actually still somewhat adhered to the wound bed. Overall I am extremely pleased though with where we stand. This is EpiCord #4 today. Upon evaluation today patient appears to be doing well with regard to her wound the EpiCord is definitely helping and does seem to be improving the overall status of the wound the surface is dramatically improved compared to what it was at the start of this. The tissue is actually becoming more red than it is just a pale pink and to be honest some of the did not even appear to be pink in the start. Nonetheless I am very pleased with where we stand currently. No fevers, chills, nausea, vomiting, or diarrhea. 07-16-2022 upon evaluation today patient appears to be doing well currently in regard to her wound she is doing well with the [place] this is application #6 today. 07-23-2022 upon evaluation today patient's wound actually showing signs of excellent improvement which is great news. Fortunately I think were  making progress again this is a very scarred area which now has a pretty good vascular supply which is great news. Overall I think that she is making great progress. [name], [name] ([id number]) [id number] Page 8 of 10 This is EpiCord #7 today 10/18; Epicort No. 8. Very minimally smaller. Wounds on the right breast mastectomy site complicated by soft tissue radiation damage. Unfortunately she lives in [place], 5 days a week transport would be impossible for her to arrange 08-06-2022 upon evaluation today patient's wound is showing signs again of minimal improvement with regard to the wound bed again this is something that would probably respond well to hyperbaric oxygen therapy and I think should we can probably get this approved but she is just not able to make the transportation from [place] here daily for the 72-monthperiod of time. Nonetheless I do think that she seems to be doing much better with regard to the surface of the wound and I am pleased that regard I am going to go ahead and continue with the EDobbins Heightsthis is #9 application today.  08-13-2022 upon evaluation today patient's wound again is showing signs of improved quality to the tissue in the base of the wound. Fortunately I do not see any evidence of infection which is great news and overall I am extremely pleased in that regard. In general I do believe that the patient is making progress here. No fevers, chills, nausea, vomiting, or diarrhea. She is here today for EpiCord application [id number]; patient with a wound on the lower part of the right breast area. She is apparently had a nice response to epi cord but she has completed this. It is difficult not to look over this lady's history and look at the wound and wonder about the known benefits of hyperbaric oxygen for soft tissue radionecrosis however she is unable to make transportation arrangements. She lives in [place] 08-27-2022 upon evaluation today  patient appears to be doing well in regard to her wound. She has been tolerating the dressing changes without complication wheezing using the silver collagen over the past week and she tolerated that without complication. Fortunately I see no evidence of active infection locally or systemically at this time. 09-17-2022 upon evaluation today patient appears to be doing well currently in regard to her wound. She has been tolerating the dressing changes without complication. Fortunately there does not appear to be any signs of active infection locally or systemically at this time which is great news. No fevers, chills, nausea, vomiting, or diarrhea. 10-15-2022 upon evaluation today patient appears to be doing well currently in regard to her wound. This is showing signs of good improvement which is great news and overall I am extremely pleased with where we stand today. Fortunately there does not appear to be any signs of active infection at this time which is great news as well. No fevers, chills, nausea, vomiting, or diarrhea. 11-12-2022 upon evaluation today patient appears to be doing pretty well in regard to her wound. She has been tolerating the dressing changes without complication and fortunately there does not appear to be any signs of active infection locally nor systemically which is great news. No fevers, chills, nausea, vomiting, or diarrhea. 12-10-2022 upon evaluation today patient appears to be doing well currently in regard to her wound. She has been tolerating the dressing changes without complication. Fortunately there does not appear to be any signs of active infection locally nor systemically which is great news. No fevers, chills, nausea, vomiting, or diarrhea. Objective Constitutional [name] and well-hydrated in no acute distress. Vitals Time Taken: 9:47 AM, Height: 63 in, Weight: 176 lbs, BMI: 31.2, Temperature: 98 F, Pulse: 81 bpm, Respiratory Rate: 20 breaths/min, Blood  Pressure: 151/78 mmHg. Respiratory normal breathing without difficulty. Psychiatric this patient is able to make decisions and demonstrates good insight into disease process. Alert and Oriented x 3. pleasant and cooperative. General Notes: Upon inspection patient's wound bed actually showed signs of good granulation epithelization at this point. Fortunately I do not see any signs of worsening overall and I am very pleased with where we are today. Integumentary (Hair, Skin) Wound #1 status is Open. Original cause of wound was Radiation Burn. The date acquired was: 07/26/2020. The wound has been in treatment 120 weeks. The wound is located on the Right Breast (mastectomy site). The wound measures 1.6cm length x 2.8cm width x 0.3cm depth; 3.519cm^2 area and 1.056cm^3 volume. There is Fat Layer (Subcutaneous Tissue) exposed. There is no tunneling noted, however, there is undermining starting at 10:00 and ending at 12:00 with a maximum distance  of 0.5cm. There is a medium amount of serosanguineous drainage noted. The wound margin is distinct with the outline attached to the wound base. There is large (67-100%) red granulation within the wound bed. There is a small (1-33%) amount of necrotic tissue within the wound bed including Adherent Slough. The periwound skin appearance had no abnormalities noted for texture. The periwound skin appearance had no abnormalities noted for moisture. The periwound skin appearance did not exhibit: Atrophie Blanche, Cyanosis, Ecchymosis, Hemosiderin Staining, Mottled, Pallor, Rubor, Erythema. Periwound temperature was noted as No Abnormality. 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 There was a Excisional Skin/Subcutaneous Tissue Debridement with a total area of 4.48 sq cm performed by Worthy Keeler, PA. With the following instrument(s): Curette to remove Viable and Non-Viable tissue/material. Material removed includes Subcutaneous Tissue, Slough, Skin: Dermis, and Skin: Epidermis after achieving pain control using Lidocaine 4% Topical Solution. A time out was conducted at 10:00, prior to the start of the procedure. A Minimum amount of bleeding was controlled with Pressure. The procedure was tolerated well with a pain level of 0 throughout and a pain level of 0 following the procedure. Post Debridement Measurements: 1.6cm length x 2.8cm width x 0.3cm depth; 1.056cm^3 volume. Character of Wound/Ulcer Post Debridement is improved. Post procedure Diagnosis Wound #1: Same as Pre-Procedure Plan Follow-up Appointments: Return appointment in 1 month. Burman Blacksmith, PA on Wednesday's Room 8 01/07/2023 Anesthetic: (In clinic) Topical Lidocaine 5% applied to wound bed Cellular or Tissue Based Products: Cellular or Tissue Based Product applied to wound bed, secured with steri-strips, cover with Adaptic or Mepitel. (DO NOT REMOVE). - Epicord #1 06/11/22, #2 06/18/22, #3 06/25/22, #4 07/02/22, #5 07/09/22, #6 07/16/22, Epicord #7 07/23/22, Epicord #8 07/30/22, Epicord #9 08/06/2022. Epicord # 10 08/13/22 - no more after [id number] application, per PA we will see how she does after this with collagen for now. Additional Orders / Instructions: Follow Nutritious Diet WOUND #1: - Breast (mastectomy site) Wound Laterality: Right Cleanser: Wound Cleanser (Generic) Every Other Day/30 Days Discharge Instructions: Cleanse the wound with wound cleanser prior to applying a clean dressing using gauze sponges, not tissue or cotton balls. Cleanser: [place] Ancillary Kit - 15 Day  Supply (DME) (Generic) Every Other Day/30 Days Discharge Instructions: Use supplies as instructed; Kit contains: (15) Saline Bullets; (15) 3x3 Gauze; 15 pr Gloves Cleanser: Soap and Water Every Other Day/30 Days Discharge Instructions: May shower and wash wound with dial antibacterial soap and water prior to dressing change. Peri-Wound Care: Skin Prep (DME) (Generic) Every Other Day/30 Days Discharge Instructions: Use skin prep as directed Prim Dressing: Promogran Prisma Matrix, 4.34 (sq in) (silver collagen) (Generic) Every Other Day/30 Days ary Discharge Instructions: Moisten collagen with saline or hydrogel Secondary Dressing: Woven Gauze Sponge, Non-Sterile 4x4 in (DME) (Generic) Every Other Day/30 Days Discharge Instructions: Apply over primary dressing as directed. Secondary Dressing: Woven Gauze Sponges 2x2 in (DME) (Generic) Every Other Day/30 Days Discharge Instructions: Apply over primary dressing, fold in corners to fill the space Secondary Dressing: Zetuvit Plus Silicone Border Dressing 5x5 (in/in) (DME) (Generic) Every Other Day/30 Days Discharge Instructions: Apply silicone border over primary dressing as directed. 1. I did  perform debridement today to clearway some of the necrotic debris using a curette she tolerated this without complication postdebridement this is significantly improved. 2. Also can recommend that we have the patient continue with the silver collagen dressing at this point. Will continue with the Zetuvit bordered foam dressing to cover. We will see patient back for reevaluation in 1 month here in the clinic. If anything worsens or changes patient will contact our office for additional recommendations. Electronic Signature(s) Signed: 12/10/2022 3:43:00 PM By: Worthy Keeler PA-C Entered By: Worthy Keeler on 12/10/2022 15:43:00 -------------------------------------------------------------------------------- SuperBill Details Patient Name: Date of  Service: [name], [name] 12/10/2022 Medical Record Number: [id number] Patient Account Number: [id number] Date of Birth/Sex: Treating RN: 09-05-1951 (72 y.o. [name] Primary Care Provider: [name] Other Clinician: Referring Provider: Treating Provider/Extender: [name], [name]: 120 Diagnosis Coding ICD-10 Codes Code Description [phone] Non-pressure chronic ulcer of skin of other sites with fat layer exposed [name], [name] ([id number]) [phone].pdf Page 10 of 10 L58.9 Radiodermatitis, unspecified L59.8 Other specified disorders of the skin and subcutaneous tissue related to radiation I10 Essential (primary) hypertension Facility Procedures : The patient participates with Medicare or their insurance follows the Medicare Facility Guidelines: CPT4 Code Description Modifier Quantity [id number] [place] TISSUE 20 SQ CM/< 1 ICD-10 Diagnosis Description L98.492 Non-pressure chronic ulcer of  skin of other sites with fat layer exposed Physician Procedures : CPT4 Code Description Modifier [id number] - WC PHYS SUBQ TISS 20 SQ CM ICD-10 Diagnosis Description L98.492 Non-pressure chronic ulcer of skin of other sites with fat layer exposed Quantity: 1 Electronic Signature(s) Signed: 12/10/2022 3:43:24 PM By: Worthy Keeler PA-C Entered By: Worthy Keeler on 12/10/2022 15:43:24

## 2022-12-15 ENCOUNTER — Encounter: Payer: Self-pay | Admitting: Family

## 2022-12-15 ENCOUNTER — Inpatient Hospital Stay: Payer: Medicare Other | Attending: Hematology & Oncology | Admitting: Family

## 2022-12-15 ENCOUNTER — Inpatient Hospital Stay: Payer: Medicare Other

## 2022-12-15 VITALS — BP 155/59 | HR 70 | Temp 98.2°F | Resp 17 | Wt 198.8 lb

## 2022-12-15 DIAGNOSIS — R0602 Shortness of breath: Secondary | ICD-10-CM | POA: Insufficient documentation

## 2022-12-15 DIAGNOSIS — D631 Anemia in chronic kidney disease: Secondary | ICD-10-CM | POA: Diagnosis not present

## 2022-12-15 DIAGNOSIS — C50011 Malignant neoplasm of nipple and areola, right female breast: Secondary | ICD-10-CM

## 2022-12-15 DIAGNOSIS — Z170 Estrogen receptor positive status [ER+]: Secondary | ICD-10-CM | POA: Diagnosis not present

## 2022-12-15 DIAGNOSIS — C50911 Malignant neoplasm of unspecified site of right female breast: Secondary | ICD-10-CM | POA: Insufficient documentation

## 2022-12-15 DIAGNOSIS — R5383 Other fatigue: Secondary | ICD-10-CM | POA: Insufficient documentation

## 2022-12-15 DIAGNOSIS — D508 Other iron deficiency anemias: Secondary | ICD-10-CM

## 2022-12-15 DIAGNOSIS — Z79811 Long term (current) use of aromatase inhibitors: Secondary | ICD-10-CM | POA: Insufficient documentation

## 2022-12-15 DIAGNOSIS — I890 Lymphedema, not elsewhere classified: Secondary | ICD-10-CM | POA: Insufficient documentation

## 2022-12-15 DIAGNOSIS — Z79899 Other long term (current) drug therapy: Secondary | ICD-10-CM | POA: Insufficient documentation

## 2022-12-15 DIAGNOSIS — N189 Chronic kidney disease, unspecified: Secondary | ICD-10-CM | POA: Insufficient documentation

## 2022-12-15 DIAGNOSIS — Z7982 Long term (current) use of aspirin: Secondary | ICD-10-CM | POA: Diagnosis not present

## 2022-12-15 LAB — CMP (CANCER CENTER ONLY)
ALT: 11 U/L (ref 0–44)
AST: 14 U/L — ABNORMAL LOW (ref 15–41)
Albumin: 4.3 g/dL (ref 3.5–5.0)
Alkaline Phosphatase: 62 U/L (ref 38–126)
Anion gap: 9 (ref 5–15)
BUN: 16 mg/dL (ref 8–23)
CO2: 29 mmol/L (ref 22–32)
Calcium: 9.9 mg/dL (ref 8.9–10.3)
Chloride: 105 mmol/L (ref 98–111)
Creatinine: 0.7 mg/dL (ref 0.44–1.00)
GFR, Estimated: >60 mL/min (ref >60)
Glucose, Bld: 105 mg/dL — ABNORMAL HIGH (ref 70–99)
Potassium: 3.9 mmol/L (ref 3.5–5.1)
Sodium: 143 mmol/L (ref 135–145)
Total Bilirubin: 0.5 mg/dL (ref 0.3–1.2)
Total Protein: 7.7 g/dL (ref 6.5–8.1)

## 2022-12-15 LAB — CBC WITH DIFFERENTIAL (CANCER CENTER ONLY)
Abs Immature Granulocytes: 0.06 10*3/uL (ref 0.00–0.07)
Basophils Absolute: 0 10*3/uL (ref 0.0–0.1)
Basophils Relative: 1 %
Eosinophils Absolute: 0.2 10*3/uL (ref 0.0–0.5)
Eosinophils Relative: 4 %
HCT: 34.5 % — ABNORMAL LOW (ref 36.0–46.0)
Hemoglobin: 10.8 g/dL — ABNORMAL LOW (ref 12.0–15.0)
Immature Granulocytes: 1 %
Lymphocytes Relative: 29 %
Lymphs Abs: 1.9 10*3/uL (ref 0.7–4.0)
MCH: 29.1 pg (ref 26.0–34.0)
MCHC: 31.3 g/dL (ref 30.0–36.0)
MCV: 93 fL (ref 80.0–100.0)
Monocytes Absolute: 0.5 10*3/uL (ref 0.1–1.0)
Monocytes Relative: 8 %
Neutro Abs: 3.9 10*3/uL (ref 1.7–7.7)
Neutrophils Relative %: 57 %
Platelet Count: 210 10*3/uL (ref 150–400)
RBC: 3.71 MIL/uL — ABNORMAL LOW (ref 3.87–5.11)
RDW: 13.5 % (ref 11.5–15.5)
WBC Count: 6.7 10*3/uL (ref 4.0–10.5)
nRBC: 0 % (ref 0.0–0.2)

## 2022-12-15 LAB — IRON AND IRON BINDING CAPACITY (CC-WL,HP ONLY)
Iron: 55 ug/dL (ref 28–170)
Saturation Ratios: 21 % (ref 10.4–31.8)
TIBC: 263 ug/dL (ref 250–450)
UIBC: 208 ug/dL (ref 148–442)

## 2022-12-15 LAB — RETICULOCYTES
Immature Retic Fract: 15.5 % (ref 2.3–15.9)
RBC.: 3.71 MIL/uL — ABNORMAL LOW (ref 3.87–5.11)
Retic Count, Absolute: 64.2 10*3/uL (ref 19.0–186.0)
Retic Ct Pct: 1.7 % (ref 0.4–3.1)

## 2022-12-15 LAB — FERRITIN: Ferritin: 465 ng/mL — ABNORMAL HIGH (ref 11–307)

## 2022-12-15 MED ORDER — DARBEPOETIN ALFA 300 MCG/0.6ML IJ SOSY
300.0000 ug | PREFILLED_SYRINGE | Freq: Once | INTRAMUSCULAR | Status: AC
  Administered 2022-12-15: 300 ug via SUBCUTANEOUS
  Filled 2022-12-15: qty 0.6

## 2022-12-15 NOTE — Progress Notes (Signed)
Hematology and Oncology Follow Up Visit  [name] [id number] 18-Feb-1951 72 y.o. 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 daily - restarted on 09/23/2022 Aspirin 81 mg p.o. daily IV iron as needed Aranesp 300 mcg sq for Hgb less than 11                Interim History:  [name] is here today for follow-up. She is feeling fatigued. She is under a good deal is stress caring for her husband who has been diagnosed with cancer.  It can be hard for her to sleep at night.  She has not noted any blood loss. No bruising or petechiae.  She is tolerating Aromasin nicely and is taking as prescribed.  No fever, chills, n/v, cough, rash, dizziness, chest pain, palpitations, abdominal pain or changes in bowel or bladder habits.  Mild SOB with exertion. She takes a break to rest when needed.  Swelling in the joints of her knees and hands waxes and wanes.  No falls or syncope reported.  Appetite and hydration are good. Weight is stable at 198 lbs.   ECOG Performance Status: 1 - Symptomatic but completely ambulatory  Medications:  Allergies as of 12/15/2022       Reactions   Contrast Media [iodinated Contrast Media] Hives, Shortness Of Breath, Nausea And Vomiting   Allergic reaction even after pre-meds.   Metrizamide Hives, Shortness Of Breath, Nausea And Vomiting   Allergic reaction even after pre-meds.   Other Shortness Of Breath, Nausea And Vomiting, Rash   Allergic reaction even after pre-meds.   Dye Fdc Blue [brilliant Blue Fcf (fd&c Blue #1)] Hives, Nausea Only   Fd&c Blue #1 (brilliant Blue) Hives, Nausea Only        Medication List        Accurate as of December 15, 2022 10:47 AM. If you have any questions, ask your nurse or doctor.          amLODipine 10 MG  tablet Commonly known as: NORVASC TAKE 1 TABLET BY MOUTH DAILY   amoxicillin 500 MG capsule Commonly known as: AMOXIL Before dental procedures   aspirin EC 81 MG tablet Take 81 mg by mouth daily.   candesartan 32 MG tablet Commonly known as: ATACAND Take 32 mg by mouth daily.   celecoxib 200 MG capsule Commonly known as: CELEBREX Take 200 mg by mouth daily as needed.   exemestane 25 MG tablet Commonly known as: AROMASIN TAKE 1 TABLET BY MOUTH EVERY DAY   Gentamicin Sulfate Powd Apply 1 Application topically daily.   hydrochlorothiazide 25 MG tablet Commonly known as: HYDRODIURIL Take 25 mg by mouth daily.   IBUPROFEN PO Take 400 mg by mouth 2 (two) times daily as needed.   metoprolol succinate 50 MG 24 hr tablet Commonly known as: TOPROL-XL Take 50 mg by mouth daily.   metoprolol tartrate 100 MG tablet Commonly known as: LOPRESSOR Take 100 mg by mouth daily.   simvastatin 20 MG tablet Commonly known as: ZOCOR Take 20 mg by mouth daily.   Vitamin D (Ergocalciferol) 1.25 MG (50000 UNIT) Caps capsule Commonly known as: DRISDOL TAKE 1 CAPSULE BY MOUTH ONE TIME PER WEEK        Allergies:  Allergies  Allergen Reactions   Contrast Media [Iodinated Contrast Media]  Hives, Shortness Of Breath and Nausea And Vomiting    Allergic reaction even after pre-meds.   Metrizamide Hives, Shortness Of Breath and Nausea And Vomiting    Allergic reaction even after pre-meds.   Other Shortness Of Breath, Nausea And Vomiting and Rash    Allergic reaction even after pre-meds.   Dye Fdc Blue [Brilliant Blue Fcf (Fd&C Blue #1)] Hives and Nausea Only   Fd&C Blue #1 (Brilliant Blue) Hives and Nausea Only    Past Medical History, Surgical history, Social history, and Family History were reviewed and updated.  Review of Systems: All other 10 point review of systems is negative.   Physical Exam:  weight is 198 lb 12.8 oz (90.2 kg). Her oral temperature is 98.2 F (36.8 C). Her  blood pressure is 155/59 (abnormal) and her pulse is 70. Her respiration is 17 and oxygen saturation is 98%.   Wt Readings from Last 3 Encounters:  12/15/22 198 lb 12.8 oz (90.2 kg)  09/23/22 194 lb 1.9 oz (88.1 kg)  07/31/22 193 lb 9.6 oz (87.8 kg)    Ocular: Sclerae unicteric, pupils equal, round and reactive to light Ear-nose-throat: Oropharynx clear, dentition fair Lymphatic: No cervical or supraclavicular adenopathy Lungs no rales or rhonchi, good excursion bilaterally Heart regular rate and rhythm, no murmur appreciated Abd soft, nontender, positive bowel sounds MSK no focal spinal tenderness, no joint edema Neuro: non-focal, well-oriented, appropriate affect Breasts: Deferred   Lab Results  Component Value Date   WBC 6.7 12/15/2022   HGB 10.8 (L) 12/15/2022   HCT 34.5 (L) 12/15/2022   MCV 93.0 12/15/2022   PLT 210 12/15/2022   Lab Results  Component Value Date   FERRITIN 423 (H) 09/23/2022   IRON 71 09/23/2022   TIBC 248 (L) 09/23/2022   UIBC 177 09/23/2022   IRONPCTSAT 29 09/23/2022   Lab Results  Component Value Date   RETICCTPCT 1.7 12/15/2022   RBC 3.71 (L) 12/15/2022   RETICCTABS 55.2 06/20/2011   No results found for: "KPAFRELGTCHN", "LAMBDASER", "KAPLAMBRATIO" No results found for: "IGGSERUM", "IGA", "IGMSERUM" Lab Results  Component Value Date   TOTALPROTELP 6.6 08/14/2010   ALBUMINELP 53.8 (L) 08/14/2010   A1GS 5.3 (H) 08/14/2010   A2GS 14.3 (H) 08/14/2010   BETS 5.8 08/14/2010   BETA2SER 6.3 08/14/2010   GAMS 14.5 08/14/2010   MSPIKE NOT DET 08/14/2010   SPEI * 08/14/2010     Chemistry      Component Value Date/Time   NA 143 09/23/2022 0952   NA 146 (H) 07/23/2017 0900   NA 144 02/18/2017 0901   K 3.6 09/23/2022 0952   K 3.7 07/23/2017 0900   K 3.7 02/18/2017 0901   CL 105 09/23/2022 0952   CL 107 07/23/2017 0900   CO2 32 09/23/2022 0952   CO2 31 07/23/2017 0900   CO2 27 02/18/2017 0901   BUN 19 09/23/2022 0952   BUN 16 07/23/2017  0900   BUN 17.7 02/18/2017 0901   CREATININE 0.58 09/23/2022 0952   CREATININE 0.8 07/23/2017 0900   CREATININE 0.7 02/18/2017 0901      Component Value Date/Time   CALCIUM 9.2 09/23/2022 0952   CALCIUM 9.3 07/23/2017 0900   CALCIUM 9.3 02/18/2017 0901   ALKPHOS 68 09/23/2022 0952   ALKPHOS 67 07/23/2017 0900   ALKPHOS 78 02/18/2017 0901   AST 14 (L) 09/23/2022 0952   AST 13 02/18/2017 0901   ALT 12 09/23/2022 0952   ALT 20 07/23/2017 0900   ALT 12  02/18/2017 0901   BILITOT 0.5 09/23/2022 0952   BILITOT 0.54 02/18/2017 0901       Impression and Plan: [name] is a very pleasant 72 yo caucasian female with history of locally recurrent adenocarcinoma of the right breast with resection and radiation.  Iron studies are pending.  ESA given, Hgb 10.8.  Continue same regimen with Aromasin.  Lab check and injection every 6 weeks. Follow-up in 3 months  Lottie Dawson, NP 3/4/202410:47 AM

## 2022-12-15 NOTE — Patient Instructions (Signed)

## 2022-12-16 LAB — CANCER ANTIGEN 27.29: CA 27.29: 25.3 U/mL (ref 0.0–38.6)

## 2023-01-07 ENCOUNTER — Encounter (HOSPITAL_BASED_OUTPATIENT_CLINIC_OR_DEPARTMENT_OTHER): Payer: Medicare Other | Attending: Physician Assistant | Admitting: Physician Assistant

## 2023-01-07 DIAGNOSIS — L598 Other specified disorders of the skin and subcutaneous tissue related to radiation: Secondary | ICD-10-CM | POA: Diagnosis not present

## 2023-01-07 DIAGNOSIS — L589 Radiodermatitis, unspecified: Secondary | ICD-10-CM | POA: Diagnosis not present

## 2023-01-07 DIAGNOSIS — I10 Essential (primary) hypertension: Secondary | ICD-10-CM | POA: Insufficient documentation

## 2023-01-07 DIAGNOSIS — L98492 Non-pressure chronic ulcer of skin of other sites with fat layer exposed: Secondary | ICD-10-CM | POA: Diagnosis present

## 2023-01-07 DIAGNOSIS — L98429 Non-pressure chronic ulcer of back with unspecified severity: Secondary | ICD-10-CM | POA: Diagnosis not present

## 2023-01-07 NOTE — Progress Notes (Addendum)
ALEHA, LARIMER ([id number]) [id number] Page 1 of 10 Visit Report for 01/07/2023 Chief Complaint Document Details Patient Name: Date of Service: [name] 01/07/2023 9:30 A M Medical Record Number: [id number] Patient Account Number: [id number] Date of Birth/Sex: Treating RN: 1951/03/14 (72 y.o. 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 [name], [name] Primary Care Provider: [name] Other Clinician: Referring Provider: Treating Provider/Extender: [name], [name]: 124 Debridement Performed for Assessment: Wound #1 Right Breast (mastectomy site) Performed By: Physician Worthy Keeler, PA Debridement Type: Debridement Level of Consciousness (Pre-procedure): Awake and Alert Pre-procedure Verification/Time Out Yes - 09:30 Taken: Start Time: 09:30 Pain Control: Lidocaine T Area Debrided (L x W): otal 1.8 (cm) x 2.7 (cm) = 4.86 (cm) Tissue and other material debrided: Viable, Non-Viable, Slough, Subcutaneous, Slough Level: Skin/Subcutaneous Tissue Debridement Description: Excisional Instrument: Curette Bleeding: Minimum Hemostasis Achieved: Pressure End Time: 09:30 Procedural Pain: 0 Post Procedural Pain: 0 Response to Treatment: Procedure was tolerated  well Level of Consciousness (Post- Awake and Alert procedure): Post Debridement Measurements of Total Wound Length: (cm) 1.8 Width: (cm) 2.7 Depth: (cm) 0.3 Volume: (cm) 1.145 Character of Wound/Ulcer Post Debridement: Improved Post Procedure Diagnosis Same as Pre-procedure Electronic Signature(s) Signed: 01/07/2023 10:58:00 AM By: Worthy Keeler PA-C Signed: 01/14/2023 4:26:45 PM By: Rhae Hammock RN Entered By: Rhae Hammock on 01/07/2023 09:33:42 [name] ([id number]) [id number] Page 2 of 10 -------------------------------------------------------------------------------- HPI Details Patient Name: Date of Service: [name] INS, [name] 01/07/2023 9:30 A M Medical Record Number: [id number] Patient Account Number: [id number] Date of Birth/Sex: Treating RN: 1950-11-06 (72 y.o. F) Primary Care Provider: [name] Other Clinician: Referring Provider: Treating Provider/Extender: [name], [name]: 124 History of Present Illness HPI Description: 08/22/2020 upon evaluation today patient actually appears to be doing somewhat poorly in regard to her mastectomy site on the right chest wall. She had a mastectomy initially in 1998. She had chemotherapy only no radiation following. In 2002 she subsequently did undergo radiation for the first time and then subsequently in 2012 had repeat radiation after having had a finding that was consistent with a relapse of the cancer. 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 She also has radiation damage to the skin over the right chest wall where she had the mastectomy. She was referred to [place] by Dr. Matthew Saras in [facility] and her oncologist is Dr. Burney Gauze. Subsequently I want to make sure before we delve into this more deeply that the patient does not have any issues with a return of cancer that needs to be managed by them.  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 Nonetheless she does not remember exactly when her last PET scan was but it was not too recently she tells me. 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  The patient does have hypertension as well. She sees her oncologist next on December 6. 09/12/2020 on evaluation today patient actually appears to be doing excellent in regard to her wound under the right breast location. Currently this is measuring smaller in general seems to be doing very well. She tells me that the collagen is doing a good job and that the dressing that she has been putting on is not causing any troubles as far pulling on her skin or scar tissue is concerned all of which is excellent news. 10/03/2020 upon evaluation today patient appears to be doing well with regard to her surgical site from her mastectomy on the right breast region. She tells me that she has had very little bleeding nothing seems to be sticking badly and in general she has been extremely pleased with where things stand today. No fevers, chills, nausea, vomiting, or diarrhea. 10/24/2020 upon evaluation today patient actually appears to be doing excellent in regard to her wound. There is just a very small area still open and to be honest there was minimal slough noted on the surface of the wound nothing that requires sharp debridement. In general I feel like she is actually doing quite well and overall I am pleased. I do think we may switch to silver alginate dressing and also contemplating using a AandE ointment in order to help keep everything moist and the scar tissue region while the alginate keeps it dry enough to heal 11/14/2020 upon evaluation today patient appears to be doing well at this point in regard to her wound. I do feel like that she is making good  progress again this is a very difficult region over the surgical site of the right chest wall at the site of mastectomy. Nonetheless I think that we are just a very small area still open I think alginate is doing a good job helping to dry this up and I would recommend this such that we continue with this. 01/02/2021 on evaluation today patient's wound actually appears to be doing decently well. There does not appear to be any signs of active infection which is great news. She does have some slight slough buildup with biofilm on the surface of the wound mainly more biofilm. Nonetheless I was able to gently remove this with saline and gauze as well as a sterile Q-tip. She tolerated that today without complication. 01/30/2021 upon evaluation today patient appears to be doing well with regard to her wound although she still has quite a bit of trouble getting this to close I think the scar tissue and radiation damage is quite significant here unfortunately. There does not appear to be any signs of active infection which is great news. No fevers, chills, nausea, vomiting, or diarrhea. 02/27/2021 upon evaluation today patient appears to be doing excellent in regard to her wound. She has been tolerating the dressing changes without complication and in general I am extremely pleased with where things stand today. There does not appear to be any signs of infection which is great news. No fevers, chills, nausea, vomiting, or diarrhea. With that being said I do think that she still developing some slough buildup. I did discuss with her today the possibility of proceeding with HBO therapy. We have had this discussion before but she really is not quite committed to wanting to do that much and spend that much time in the chamber. She wants to still think about it. 03/27/2021 upon evaluation today patient appears to be doing about the  same in regard to her wound. She still developing a lot of slough buildup on the  surface of the wound. I did try to clean that away to some degree today. She tolerated that without any pain or complication. Subsequently I am thinking we may want to switch over to [facility] see if this may do better for her. Patient is in agreement with giving that a trial. 05/01/2021 upon evaluation today patient appears to be doing about the same in regard to her wounds. She has been tolerating the dressing changes without complication. Fortunately there does not appear to be any signs of active infection at this time which is great news. No fevers, chills, nausea, vomiting, or diarrhea.. 06/05/2021 upon evaluation today patient appears to be doing pretty well currently in regard to her wound at the mastectomy site right chest wall. Overall since we switch back to the alginate she tells me things have significantly improved she is much happier with where things stand currently. Fortunately there does not appear to be any signs of active infection which is great news. No fevers, chills, nausea, vomiting, or diarrhea. 07/10/2021 upon evaluation today patient unfortunately does not appear to be doing nearly as well as what she previously was. There does not appear to be any evidence of new epithelial growth she has a lot of necrotic tissue the base of the wound this is much more significant than what we previously noted. She also has been having increased pain and increased drainage. In general I am very concerned about this especially in light of the fact that she does have a history of breast cancer I want to ensure that were not looking at any cancerous type lesion at this point. Otherwise also think she does need some debridement we did do MolecuLight scanning today. 07/17/2021 upon evaluation today patient appears to be doing well with regard to her wound compared to last week although she still having some erythema I am concerned in this regard. I do think that she fortunately had a negative biopsy  which is great news unfortunately she did have Pseudomonas noted as a bacteria present in the culture which I think is good and need to be addressed with Levaquin. I Minna send that into the pharmacy today. We did repeat the MolecuLight screening. 07/31/2021 upon evaluation today patient's wound is actually showing signs of improvement which is great news. Fortunately there does not appear to be any signs of infection currently locally nor systemically and I am very pleased in that regard. With that being said the patient is having some issues here with continued necrotic tissue I think packing with the Dakin's moistened gauze dressing is still in the right leg ago. 08/14/2021 upon evaluation today patient appears to be doing well with regard to her wound all things considered I do not see any signs of significant infection which is great news. Fortunately there does not appear to be any evidence of infection which is also great news. In general I think that the patient is tolerating the dressing changes well. We been using a Dakin's moistened gauze. 08/28/2021 upon evaluation today patient appears to be doing well with regard to her wound. Worsening signs of definite improvement which is great news and overall very pleased with where we stand today. There is no signs of inflamed tissue or infection which is great as well and I think the Dakin's is doing a great job here. [name], [name] ([id number]) [id number] Page 3 of 10 09/11/2021 upon evaluation  today patient appears to be doing well with regard to her wound all things considered. I am can perform some debridement today to clear away some of the necrotic debris with that being said overall I think that she is making decently good progress here which is great news. There does not appear to be any signs of active infection also good news. That in fact is probably the best news in her mind. 09/25/2021 upon evaluation today  patient appears to be doing decently well in regard to her wound. Overall I do not see any signs of infection and I think she is doing quite well. I do believe that we are making headway here although is very slow due to the scarring and the depth of the wound this is a significantly deep wound. 10/16/2021 upon evaluation today patient appears to be doing about the same in regard to the wound on her right chest wall. Fortunately there does not appear to be any signs of active infection locally nor systemically which is great news. With that being said this still was not cleaning up quite as quickly as I would like to see. Fortunately I think that we can definitely give the Santyl another try we tried this in the past and unfortunately did not have a good result but again I think she was infected at that time as well which was part of the issue. Nonetheless I think currently that we will be able to [name] ahead and see what we can do about getting this little cleaner with the Santyl. 11/06/2021 upon evaluation today patient appears to be doing well with regard to her wound. This actually appears to be a little bit better with the Santyl I am happy in that regard. Fortunately I do not see any signs of active infection at this time. No fevers, chills, nausea, vomiting, or diarrhea. 12/04/2021 upon evaluation patient appears to be doing well with the Santyl at this point. I am very pleased with where we stand and I think that she is making good progress here. Fortunately I do not see any evidence of active infection locally or systemically at this time which is great news. No fevers, chills, nausea, vomiting, or diarrhea. 01/01/2022 upon evaluation today patient actually is making excellent progress with the Santyl. I am extremely pleased with where we stand today. I do not see any signs of infection. 01-29-2022 upon evaluation today patient appears to be doing well with regard to her wound. This is showing signs  of some improvement. It is a little less deep than what it was. Size wise is also slightly smaller but again there still is a significant wound in this area. Fortunately I do not see any evidence of infection and she is doing quite well. 02-26-2022 upon evaluation today patient appears to be doing well with regard to her wound. In fact this is actually the best that I have seen in a very long time. I do not see any evidence of active infection at this time locally or systemically which is great news and overall I think we are on the right track. Nonetheless I do believe that the patient is continuing to show evidence of improvement with the Santyl week by week. 03-26-2022 upon evaluation today patient appears to be doing well with regard to the wound on her right mastectomy site. She is actually showing signs of excellent improvement in fact there was some bleeding just with a little bit of light cleaning over the area.  Fortunately I do not see any signs of active infection locally or systemically at this time which is great news. 04-30-2022 upon evaluation today patient appears to be doing well currently in regard to her wound. She is actually showing some signs of improvement here which is great news. Still this is very very slow. Subsequently I do believe that she may benefit from the use of [place] topical antibiotics for short amount of time before applying a skin substitute I think EpiCord could be doing very well for her as far as that is concerned. I discussed that with her today. Fortunately I do not see any evidence of active infection locally or systemically at this time which is great news. No fevers, chills, nausea, vomiting, or diarrhea. 05-28-2022 upon evaluation today patient's wound is actually showing signs of significant improvement. Fortunately I do not see any evidence of infection at this time which is great news. No fevers, chills, nausea, vomiting, or diarrhea. With that being  said I do believe that the patient is tolerating the dressing changes without complication. We been using the [facility] which I think is helpful. We do have approval for the [place]. 06-11-2022 upon evaluation today patient appears to be doing well with regard to her wound she is actually here today for the first application of [place] which I think is good to be beneficial for her. I do think that we will probably be able to keep this in place without can be the one thing of question to be honest. 06-18-2022 upon evaluation today patient actually appears to be doing excellent today. She has 1 treatment of the EpiCord underway and the wound surface already looks much better. This will be EpiCord #2 today. Fortunately I think we are on the right track here. 06-25-2022 upon evaluation today patient appears to be making good progress and overall the patient seems to have tolerated EpiCord without any complication. I do feel like that the wound is improving quite significantly. This is EpiCord #3 today. 07-02-2021 upon evaluation today patient's wound is actually showing signs of improvement. I am actually very pleased with where we stand we have been seeing some improvements in size overall and I think that we are still in the right track although there is a little bit of need for sharp debridement today to clearway some of the necrotic debris and remaining EpiCord which is actually still somewhat adhered to the wound bed. Overall I am extremely pleased though with where we stand. This is EpiCord #4 today. Upon evaluation today patient appears to be doing well with regard to her wound the EpiCord is definitely helping and does seem to be improving the overall status of the wound the surface is dramatically improved compared to what it was at the start of this. The tissue is actually becoming more red than it is just a pale pink and to be honest some of the did not even appear to be pink in the start.  Nonetheless I am very pleased with where we stand currently. No fevers, chills, nausea, vomiting, or diarrhea. 07-16-2022 upon evaluation today patient appears to be doing well currently in regard to her wound she is doing well with the [place] this is application #6 today. 07-23-2022 upon evaluation today patient's wound actually showing signs of excellent improvement which is great news. Fortunately I think were making progress again this is a very scarred area which now has a pretty good vascular supply which is great news. Overall I think that  she is making great progress. This is EpiCord #7 today 10/18; Epicort No. 8. Very minimally smaller. Wounds on the right breast mastectomy site complicated by soft tissue radiation damage. Unfortunately she lives in [place], 5 days a week transport would be impossible for her to arrange 08-06-2022 upon evaluation today patient's wound is showing signs again of minimal improvement with regard to the wound bed again this is something that would probably respond well to hyperbaric oxygen therapy and I think should we can probably get this approved but she is just not able to make the transportation from [place] here daily for the 84-month period of time. Nonetheless I do think that she seems to be doing much better with regard to the surface of the wound and I am pleased that regard I am going to [name] ahead and continue with the [place] this is #9 application today. 08-13-2022 upon evaluation today patient's wound again is showing signs of improved quality to the tissue in the base of the wound. Fortunately I do not see any evidence of infection which is great news and overall I am extremely pleased in that regard. In general I do believe that the patient is making progress here. No fevers, chills, nausea, vomiting, or diarrhea. She is here today for EpiCord application [id number]; patient with a wound on the lower part of the right breast area. She is apparently  had a nice response to epi cord but she has completed this. It is difficult not to look over this lady's history and look at the wound and wonder about the known benefits of hyperbaric oxygen for soft tissue radionecrosis however she is unable to make transportation arrangements. She lives in [place] 08-27-2022 upon evaluation today patient appears to be doing well in regard to her wound. She has been tolerating the dressing changes without complication wheezing using the silver collagen over the past week and she tolerated that without complication. Fortunately I see no evidence of active infection locally or systemically at this time. [name], [name] ([id number]) [id number] Page 4 of 10 09-17-2022 upon evaluation today patient appears to be doing well currently in regard to her wound. She has been tolerating the dressing changes without complication. Fortunately there does not appear to be any signs of active infection locally or systemically at this time which is great news. No fevers, chills, nausea, vomiting, or diarrhea. 10-15-2022 upon evaluation today patient appears to be doing well currently in regard to her wound. This is showing signs of good improvement which is great news and overall I am extremely pleased with where we stand today. Fortunately there does not appear to be any signs of active infection at this time which is great news as well. No fevers, chills, nausea, vomiting, or diarrhea. 11-12-2022 upon evaluation today patient appears to be doing pretty well in regard to her wound. She has been tolerating the dressing changes without complication and fortunately there does not appear to be any signs of active infection locally nor systemically which is great news. No fevers, chills, nausea, vomiting, or diarrhea. 12-10-2022 upon evaluation today patient appears to be doing well currently in regard to her wound. She has been tolerating the dressing changes  without complication. Fortunately there does not appear to be any signs of active infection locally nor systemically which is great news. No fevers, chills, nausea, vomiting, or diarrhea. 01-07-2023 upon evaluation today patient appears to be doing actually better in regard to her wound I am actually very pleased with where  things stand. The size is about the same but the base of the wound actually appears to be healthier with more red granulation tissue and there is some slough and biofilm I am going to perform debridement to clear some of this wound today for continuing with collagen at this point she is doing a good job taking care of this. Electronic Signature(s) Signed: 01/07/2023 9:46:33 AM By: Worthy Keeler PA-C Entered By: Worthy Keeler on 01/07/2023 09:46:32 -------------------------------------------------------------------------------- Physical Exam Details Patient Name: Date of Service: [name] INS, [name] 01/07/2023 9:30 A M Medical Record Number: [id number] Patient Account Number: [id number] Date of Birth/Sex: Treating RN: Apr 16, 1951 (72 y.o. F) Primary Care Provider: [name] Other Clinician: Referring Provider: Treating Provider/Extender: [name], [name]: 81 Constitutional [name] and well-hydrated in no acute distress. Respiratory normal breathing without difficulty. Psychiatric this patient is able to make decisions and demonstrates good insight into disease process. Alert and Oriented x 3. pleasant and cooperative. Notes Upon inspection patient's wound bed actually showed signs of good granulation epithelization at this point. Fortunately there does not appear to be any signs of active infection locally nor systemically which is great news and overall I am extremely pleased with where we stand currently. I did perform debridement clearway necrotic debris she tolerated that without complication postdebridement wound bed is  significantly improved. Electronic Signature(s) Signed: 01/07/2023 9:46:57 AM By: Worthy Keeler PA-C Entered By: Worthy Keeler on 01/07/2023 09:46:56 -------------------------------------------------------------------------------- Physician Orders Details Patient Name: Date of Service: [name] INS, [name] 01/07/2023 9:30 A M Medical Record Number: [id number] Patient Account Number: [id number] Date of Birth/Sex: Treating RN: June 28, 1951 (72 y.o. 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 Burman Blacksmith, PA on Wednesday's Anesthetic (In clinic) Topical Lidocaine 5% applied to wound bed Cellular or Tissue Based Products daptic or Mepitel. (DO NOT REMOVE). - Cellular or Tissue Based Product applied to wound bed, secured with steri-strips, cover with A Epicord #1 06/11/22, #2 06/18/22, #3 06/25/22, #4 07/02/22, #5 07/09/22, #6 07/16/22, Epicord #7 07/23/22, Epicord #8 07/30/22, Epicord #9 08/06/2022. Epicord # 10 08/13/22 - no more after [id number] application, per PA we will see how she does after this with collagen for now. Additional Orders / Instructions Follow Nutritious Diet Wound Treatment Wound #1 - Breast (mastectomy site) Wound Laterality: Right Cleanser: Wound Cleanser (Generic) Every Other Day/30 Days Discharge Instructions: Cleanse the wound with wound cleanser prior to applying a clean dressing using gauze sponges,  not tissue or cotton balls. Cleanser: [place] Ancillary Kit - 15 Day Supply (Generic) Every Other Day/30 Days Discharge Instructions: Use supplies as instructed; Kit contains: (15) Saline Bullets; (15) 3x3 Gauze; 15 pr Gloves Cleanser: Soap and Water Every Other Day/30 Days Discharge Instructions: May shower and wash wound with dial antibacterial soap and water prior to dressing change. Peri-Wound Care: Skin Prep (Generic) Every Other Day/30 Days Discharge Instructions: Use skin prep as directed Prim Dressing: Promogran Prisma Matrix, 4.34 (sq in) (silver collagen) (Generic) Every Other Day/30 Days ary Discharge Instructions: Moisten collagen with saline  or hydrogel Secondary Dressing: Woven Gauze Sponge, Non-Sterile 4x4 in (Generic) Every Other Day/30 Days Discharge Instructions: Apply over primary dressing as directed. Secondary Dressing: Woven Gauze Sponges 2x2 in (Generic) Every Other Day/30 Days Discharge Instructions: Apply over primary dressing, fold in corners to fill the space Secondary Dressing: Zetuvit Plus Silicone Border Dressing 5x5 (in/in) (Generic) Every Other Day/30 Days Discharge Instructions: Apply silicone border over primary dressing as directed. Electronic Signature(s) Signed: 01/07/2023 10:58:00 AM By: Worthy Keeler PA-C Signed: 01/14/2023 4:26:45 PM By: Rhae Hammock RN Entered By: Rhae Hammock on 01/07/2023 09:38:27 -------------------------------------------------------------------------------- Problem List Details Patient Name: Date of Service: [name] INS, [name] 01/07/2023 9:30 A M Medical Record Number: [id number] Patient Account Number: [id number] Date of Birth/Sex: Treating RN: Mar 15, 1951 (72 y.o. F) Primary Care Provider: [name] Other Clinician: Referring Provider: Treating Provider/Extender: [name], [name] in Treatment: [phone] Active Problems ICD-10 Encounter Code Description Active Date MDM Diagnosis L98.492  Non-pressure chronic ulcer of skin of other sites with fat layer exposed 08/22/2020 No Yes L58.9 Radiodermatitis, unspecified 08/22/2020 No Yes L59.8 Other specified disorders of the skin and subcutaneous tissue related to 08/22/2020 No Yes radiation [name], [name] ([id number]) [id number] Page 6 of 10 I10 Essential (primary) hypertension 08/22/2020 No Yes Inactive Problems Resolved Problems Electronic Signature(s) Signed: 01/07/2023 9:29:31 AM By: Worthy Keeler PA-C Entered By: Worthy Keeler on 01/07/2023 09:29:31 -------------------------------------------------------------------------------- Progress Note Details Patient Name: Date of Service: [name] INS, [name] 01/07/2023 9:30 A M Medical Record Number: [id number] Patient Account Number: [id number] Date of Birth/Sex: Treating RN: [birth year] (72 y.o. 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 She had a mastectomy initially in 1998. She had chemotherapy only no radiation following. In 2002 she subsequently did undergo radiation for the first time and then subsequently in 2012 had repeat radiation after having had a finding that was consistent with a relapse of the cancer. 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 She also has radiation damage to the skin over the right chest wall where she had the mastectomy. She was referred to [place] by Dr. Matthew Saras in [facility] and her oncologist is Dr. Burney Gauze. Subsequently I want to make sure before we  delve into this more deeply that the patient does not have any issues with a return of cancer that needs to be managed by them. 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 Nonetheless she does not remember exactly when her last PET scan was but it was not too recently she tells me. 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 The patient does have hypertension as well. She sees her oncologist next on December 6. 09/12/2020 on evaluation today patient actually appears to be doing excellent in regard to her wound under the right breast location. Currently this is measuring smaller in general seems to be doing very well. She tells me that the collagen is doing a good job and that the dressing that she has been  putting on is not causing any troubles as far pulling on her skin or scar tissue is concerned all of which is excellent news. 10/03/2020 upon evaluation today patient appears to be doing well with regard to her surgical site from her mastectomy on the right breast region. She tells me that she has had very little bleeding nothing seems to be sticking badly and in general she has been extremely pleased with where things stand today. No fevers, chills, nausea, vomiting, or diarrhea. 10/24/2020 upon evaluation today patient actually appears to be doing excellent in regard to her wound. There is just a very small area still open and to be honest there was minimal slough noted on the surface of the wound nothing that requires sharp debridement. In general I feel like she is actually doing quite well and overall I am pleased. I do think we may switch to silver alginate dressing and also contemplating using a AandE ointment in order to help keep everything moist and the scar tissue region while the alginate keeps it dry enough to heal 11/14/2020 upon  evaluation today patient appears to be doing well at this point in regard to her wound. I do feel like that she is making good progress again this is a very difficult region over the surgical site of the right chest wall at the site of mastectomy. Nonetheless I think that we are just a very small area still open I think alginate is doing a good job helping to dry this up and I would recommend this such that we continue with this. 01/02/2021 on evaluation today patient's wound actually appears to be doing decently well. There does not appear to be any signs of active infection which is great news. She does have some slight slough buildup with biofilm on the surface of the wound mainly more biofilm. Nonetheless I was able to gently remove this with saline and gauze as well as a sterile Q-tip. She tolerated that today without complication. 01/30/2021 upon evaluation today patient appears to be doing well with regard to her wound although she still has quite a bit of trouble getting this to close I think the scar tissue and radiation damage is quite significant here unfortunately. There does not appear to be any signs of active infection which is great news. No fevers, chills, nausea, vomiting, or diarrhea. 02/27/2021 upon evaluation today patient appears to be doing excellent in regard to her wound. She has been tolerating the dressing changes without complication and in general I am extremely pleased with where things stand today. There does not appear to be any signs of infection which is great news. No fevers, chills, nausea, vomiting, or diarrhea. With that being said I do think that she still developing some slough buildup. I did discuss with her today the possibility of proceeding with HBO therapy. We have had this discussion before but she really is not quite committed to wanting to do that much and spend that much time in the chamber. She wants to still think about it. 03/27/2021 upon evaluation today  patient appears to be doing about the same in regard to her wound. She still developing a lot of slough buildup on the surface of the wound. I did try to clean that away to some degree today. She tolerated that without any pain or complication. Subsequently I am thinking we may want to switch over to [facility] see if this may do better for her. Patient is in agreement with giving that a  trial. 05/01/2021 upon evaluation today patient appears to be doing about the same in regard to her wounds. She has been tolerating the dressing changes without complication. Fortunately there does not appear to be any signs of active infection at this time which is great news. No fevers, chills, nausea, vomiting, or diarrhea.. 06/05/2021 upon evaluation today patient appears to be doing pretty well currently in regard to her wound at the mastectomy site right chest wall. Overall since [place] ([id number]) [id number] Page 7 of 10 we switch back to the alginate she tells me things have significantly improved she is much happier with where things stand currently. Fortunately there does not appear to be any signs of active infection which is great news. No fevers, chills, nausea, vomiting, or diarrhea. 07/10/2021 upon evaluation today patient unfortunately does not appear to be doing nearly as well as what she previously was. There does not appear to be any evidence of new epithelial growth she has a lot of necrotic tissue the base of the wound this is much more significant than what we previously noted. She also has been having increased pain and increased drainage. In general I am very concerned about this especially in light of the fact that she does have a history of breast cancer I want to ensure that were not looking at any cancerous type lesion at this point. Otherwise also think she does need some debridement we did do MolecuLight scanning today. 07/17/2021 upon evaluation today patient  appears to be doing well with regard to her wound compared to last week although she still having some erythema I am concerned in this regard. I do think that she fortunately had a negative biopsy which is great news unfortunately she did have Pseudomonas noted as a bacteria present in the culture which I think is good and need to be addressed with Levaquin. I Minna send that into the pharmacy today. We did repeat the MolecuLight screening. 07/31/2021 upon evaluation today patient's wound is actually showing signs of improvement which is great news. Fortunately there does not appear to be any signs of infection currently locally nor systemically and I am very pleased in that regard. With that being said the patient is having some issues here with continued necrotic tissue I think packing with the Dakin's moistened gauze dressing is still in the right leg ago. 08/14/2021 upon evaluation today patient appears to be doing well with regard to her wound all things considered I do not see any signs of significant infection which is great news. Fortunately there does not appear to be any evidence of infection which is also great news. In general I think that the patient is tolerating the dressing changes well. We been using a Dakin's moistened gauze. 08/28/2021 upon evaluation today patient appears to be doing well with regard to her wound. Worsening signs of definite improvement which is great news and overall very pleased with where we stand today. There is no signs of inflamed tissue or infection which is great as well and I think the Dakin's is doing a great job here. 09/11/2021 upon evaluation today patient appears to be doing well with regard to her wound all things considered. I am can perform some debridement today to clear away some of the necrotic debris with that being said overall I think that she is making decently good progress here which is great news. There does not appear to be any signs of  active infection also good news. That in fact is  probably the best news in her mind. 09/25/2021 upon evaluation today patient appears to be doing decently well in regard to her wound. Overall I do not see any signs of infection and I think she is doing quite well. I do believe that we are making headway here although is very slow due to the scarring and the depth of the wound this is a significantly deep wound. 10/16/2021 upon evaluation today patient appears to be doing about the same in regard to the wound on her right chest wall. Fortunately there does not appear to be any signs of active infection locally nor systemically which is great news. With that being said this still was not cleaning up quite as quickly as I would like to see. Fortunately I think that we can definitely give the Santyl another try we tried this in the past and unfortunately did not have a good result but again I think she was infected at that time as well which was part of the issue. Nonetheless I think currently that we will be able to [name] ahead and see what we can do about getting this little cleaner with the Santyl. 11/06/2021 upon evaluation today patient appears to be doing well with regard to her wound. This actually appears to be a little bit better with the Santyl I am happy in that regard. Fortunately I do not see any signs of active infection at this time. No fevers, chills, nausea, vomiting, or diarrhea. 12/04/2021 upon evaluation patient appears to be doing well with the Santyl at this point. I am very pleased with where we stand and I think that she is making good progress here. Fortunately I do not see any evidence of active infection locally or systemically at this time which is great news. No fevers, chills, nausea, vomiting, or diarrhea. 01/01/2022 upon evaluation today patient actually is making excellent progress with the Santyl. I am extremely pleased with where we stand today. I do not see any signs of  infection. 01-29-2022 upon evaluation today patient appears to be doing well with regard to her wound. This is showing signs of some improvement. It is a little less deep than what it was. Size wise is also slightly smaller but again there still is a significant wound in this area. Fortunately I do not see any evidence of infection and she is doing quite well. 02-26-2022 upon evaluation today patient appears to be doing well with regard to her wound. In fact this is actually the best that I have seen in a very long time. I do not see any evidence of active infection at this time locally or systemically which is great news and overall I think we are on the right track. Nonetheless I do believe that the patient is continuing to show evidence of improvement with the Santyl week by week. 03-26-2022 upon evaluation today patient appears to be doing well with regard to the wound on her right mastectomy site. She is actually showing signs of excellent improvement in fact there was some bleeding just with a little bit of light cleaning over the area. Fortunately I do not see any signs of active infection locally or systemically at this time which is great news. 04-30-2022 upon evaluation today patient appears to be doing well currently in regard to her wound. She is actually showing some signs of improvement here which is great news. Still this is very very slow. Subsequently I do believe that she may benefit from the use of [place]  topical antibiotics for short amount of time before applying a skin substitute I think EpiCord could be doing very well for her as far as that is concerned. I discussed that with her today. Fortunately I do not see any evidence of active infection locally or systemically at this time which is great news. No fevers, chills, nausea, vomiting, or diarrhea. 05-28-2022 upon evaluation today patient's wound is actually showing signs of significant improvement. Fortunately I do not see any  evidence of infection at this time which is great news. No fevers, chills, nausea, vomiting, or diarrhea. With that being said I do believe that the patient is tolerating the dressing changes without complication. We been using the [facility] which I think is helpful. We do have approval for the [place]. 06-11-2022 upon evaluation today patient appears to be doing well with regard to her wound she is actually here today for the first application of [place] which I think is good to be beneficial for her. I do think that we will probably be able to keep this in place without can be the one thing of question to be honest. 06-18-2022 upon evaluation today patient actually appears to be doing excellent today. She has 1 treatment of the EpiCord underway and the wound surface already looks much better. This will be EpiCord #2 today. Fortunately I think we are on the right track here. 06-25-2022 upon evaluation today patient appears to be making good progress and overall the patient seems to have tolerated EpiCord without any complication. I do feel like that the wound is improving quite significantly. This is EpiCord #3 today. 07-02-2021 upon evaluation today patient's wound is actually showing signs of improvement. I am actually very pleased with where we stand we have been seeing some improvements in size overall and I think that we are still in the right track although there is a little bit of need for sharp debridement today to clearway some of the necrotic debris and remaining EpiCord which is actually still somewhat adhered to the wound bed. Overall I am extremely pleased though with where we stand. This is EpiCord #4 today. Upon evaluation today patient appears to be doing well with regard to her wound the EpiCord is definitely helping and does seem to be improving the overall status of the wound the surface is dramatically improved compared to what it was at the start of this. The tissue is actually becoming  more red than it is just a pale pink and to be honest some of the did not even appear to be pink in the start. Nonetheless I am very pleased with where we stand currently. No fevers, chills, nausea, vomiting, or diarrhea. 07-16-2022 upon evaluation today patient appears to be doing well currently in regard to her wound she is doing well with the [place] this is application #6 today. [name], [name] ([id number]) [id number] Page 8 of 10 07-23-2022 upon evaluation today patient's wound actually showing signs of excellent improvement which is great news. Fortunately I think were making progress again this is a very scarred area which now has a pretty good vascular supply which is great news. Overall I think that she is making great progress. This is EpiCord #7 today 10/18; Epicort No. 8. Very minimally smaller. Wounds on the right breast mastectomy site complicated by soft tissue radiation damage. Unfortunately she lives in [place], 5 days a week transport would be impossible for her to arrange 08-06-2022 upon evaluation today patient's wound is showing signs again of minimal improvement  with regard to the wound bed again this is something that would probably respond well to hyperbaric oxygen therapy and I think should we can probably get this approved but she is just not able to make the transportation from [place] here daily for the 50-month period of time. Nonetheless I do think that she seems to be doing much better with regard to the surface of the wound and I am pleased that regard I am going to [name] ahead and continue with the [place] this is #9 application today. 08-13-2022 upon evaluation today patient's wound again is showing signs of improved quality to the tissue in the base of the wound. Fortunately I do not see any evidence of infection which is great news and overall I am extremely pleased in that regard. In general I do believe that the patient is making  progress here. No fevers, chills, nausea, vomiting, or diarrhea. She is here today for EpiCord application [id number]; patient with a wound on the lower part of the right breast area. She is apparently had a nice response to epi cord but she has completed this. It is difficult not to look over this lady's history and look at the wound and wonder about the known benefits of hyperbaric oxygen for soft tissue radionecrosis however she is unable to make transportation arrangements. She lives in [place] 08-27-2022 upon evaluation today patient appears to be doing well in regard to her wound. She has been tolerating the dressing changes without complication wheezing using the silver collagen over the past week and she tolerated that without complication. Fortunately I see no evidence of active infection locally or systemically at this time. 09-17-2022 upon evaluation today patient appears to be doing well currently in regard to her wound. She has been tolerating the dressing changes without complication. Fortunately there does not appear to be any signs of active infection locally or systemically at this time which is great news. No fevers, chills, nausea, vomiting, or diarrhea. 10-15-2022 upon evaluation today patient appears to be doing well currently in regard to her wound. This is showing signs of good improvement which is great news and overall I am extremely pleased with where we stand today. Fortunately there does not appear to be any signs of active infection at this time which is great news as well. No fevers, chills, nausea, vomiting, or diarrhea. 11-12-2022 upon evaluation today patient appears to be doing pretty well in regard to her wound. She has been tolerating the dressing changes without complication and fortunately there does not appear to be any signs of active infection locally nor systemically which is great news. No fevers, chills, nausea, vomiting, or diarrhea. 12-10-2022 upon evaluation  today patient appears to be doing well currently in regard to her wound. She has been tolerating the dressing changes without complication. Fortunately there does not appear to be any signs of active infection locally nor systemically which is great news. No fevers, chills, nausea, vomiting, or diarrhea. 01-07-2023 upon evaluation today patient appears to be doing actually better in regard to her wound I am actually very pleased with where things stand. The size is about the same but the base of the wound actually appears to be healthier with more red granulation tissue and there is some slough and biofilm I am going to perform debridement to clear some of this wound today for continuing with collagen at this point she is doing a good job taking care of this. Objective Constitutional [name] and well-hydrated in no  acute distress. Vitals Time Taken: 9:20 AM, Height: 63 in, Weight: 176 lbs, BMI: 31.2, Temperature: 98 F, Pulse: 65 bpm, Respiratory Rate: 17 breaths/min, Blood Pressure: 161/82 mmHg. Respiratory normal breathing without difficulty. Psychiatric this patient is able to make decisions and demonstrates good insight into disease process. Alert and Oriented x 3. pleasant and cooperative. General Notes: Upon inspection patient's wound bed actually showed signs of good granulation epithelization at this point. Fortunately there does not appear to be any signs of active infection locally nor systemically which is great news and overall I am extremely pleased with where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

## 2023-01-12 DIAGNOSIS — C50919 Malignant neoplasm of unspecified site of unspecified female breast: Secondary | ICD-10-CM | POA: Diagnosis not present

## 2023-01-12 DIAGNOSIS — E785 Hyperlipidemia, unspecified: Secondary | ICD-10-CM | POA: Diagnosis not present

## 2023-01-14 DIAGNOSIS — S81801A Unspecified open wound, right lower leg, initial encounter: Secondary | ICD-10-CM | POA: Diagnosis not present

## 2023-01-14 DIAGNOSIS — Z6834 Body mass index (BMI) 34.0-34.9, adult: Secondary | ICD-10-CM | POA: Diagnosis not present
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
## 2023-01-26 ENCOUNTER — Inpatient Hospital Stay: Payer: Medicare Other | Attending: Hematology & Oncology

## 2023-01-26 ENCOUNTER — Inpatient Hospital Stay: Payer: Medicare Other

## 2023-01-26 DIAGNOSIS — C50911 Malignant neoplasm of unspecified site of right female breast: Secondary | ICD-10-CM | POA: Diagnosis not present

## 2023-01-26 DIAGNOSIS — C50011 Malignant neoplasm of nipple and areola, right female breast: Secondary | ICD-10-CM

## 2023-01-26 DIAGNOSIS — D508 Other iron deficiency anemias: Secondary | ICD-10-CM

## 2023-01-26 DIAGNOSIS — D631 Anemia in chronic kidney disease: Secondary | ICD-10-CM

## 2023-01-26 LAB — CBC WITH DIFFERENTIAL (CANCER CENTER ONLY)
Abs Immature Granulocytes: 0.01 10*3/uL (ref 0.00–0.07)
Basophils Absolute: 0 10*3/uL (ref 0.0–0.1)
Basophils Relative: 1 %
Eosinophils Absolute: 0.3 10*3/uL (ref 0.0–0.5)
Eosinophils Relative: 4 %
HCT: 40.3 % (ref 36.0–46.0)
Hemoglobin: 12.7 g/dL (ref 12.0–15.0)
Immature Granulocytes: 0 %
Lymphocytes Relative: 31 %
Lymphs Abs: 2.2 10*3/uL (ref 0.7–4.0)
MCH: 28.8 pg (ref 26.0–34.0)
MCHC: 31.5 g/dL (ref 30.0–36.0)
MCV: 91.4 fL (ref 80.0–100.0)
Monocytes Absolute: 0.5 10*3/uL (ref 0.1–1.0)
Monocytes Relative: 7 %
Neutro Abs: 4 10*3/uL (ref 1.7–7.7)
Neutrophils Relative %: 57 %
Platelet Count: 185 10*3/uL (ref 150–400)
RBC: 4.41 MIL/uL (ref 3.87–5.11)
RDW: 13.9 % (ref 11.5–15.5)
WBC Count: 7 10*3/uL (ref 4.0–10.5)
nRBC: 0 % (ref 0.0–0.2)

## 2023-01-26 LAB — CMP (CANCER CENTER ONLY)
ALT: 12 U/L (ref 0–44)
AST: 13 U/L — ABNORMAL LOW (ref 15–41)
Albumin: 4.2 g/dL (ref 3.5–5.0)
Alkaline Phosphatase: 59 U/L (ref 38–126)
Anion gap: 9 (ref 5–15)
BUN: 16 mg/dL (ref 8–23)
CO2: 31 mmol/L (ref 22–32)
Calcium: 9.6 mg/dL (ref 8.9–10.3)
Chloride: 103 mmol/L (ref 98–111)
Creatinine: 0.75 mg/dL (ref 0.44–1.00)
GFR, Estimated: >60 mL/min (ref >60)
Glucose, Bld: 101 mg/dL — ABNORMAL HIGH (ref 70–99)
Potassium: 4.1 mmol/L (ref 3.5–5.1)
Sodium: 143 mmol/L (ref 135–145)
Total Bilirubin: 0.5 mg/dL (ref 0.3–1.2)
Total Protein: 7.6 g/dL (ref 6.5–8.1)

## 2023-01-26 NOTE — Progress Notes (Signed)
Pt informed that her HGB is 12.7 and no injection is needed today.  CBC results and upcoming appts given to pt.  Pt is appreciative of assistance and has no questions or concerns at this time.

## 2023-01-29 DIAGNOSIS — L602 Onychogryphosis: Secondary | ICD-10-CM | POA: Diagnosis not present

## 2023-02-04 ENCOUNTER — Encounter (HOSPITAL_BASED_OUTPATIENT_CLINIC_OR_DEPARTMENT_OTHER): Payer: Medicare Other | Attending: Physician Assistant | Admitting: Physician Assistant

## 2023-02-04 DIAGNOSIS — I10 Essential (primary) hypertension: Secondary | ICD-10-CM | POA: Diagnosis not present

## 2023-02-04 DIAGNOSIS — L98492 Non-pressure chronic ulcer of skin of other sites with fat layer exposed: Secondary | ICD-10-CM | POA: Insufficient documentation

## 2023-02-04 DIAGNOSIS — Z08 Encounter for follow-up examination after completed treatment for malignant neoplasm: Secondary | ICD-10-CM | POA: Insufficient documentation

## 2023-02-04 DIAGNOSIS — Y828 Other medical devices associated with adverse incidents: Secondary | ICD-10-CM | POA: Diagnosis not present

## 2023-02-04 DIAGNOSIS — Z9011 Acquired absence of right breast and nipple: Secondary | ICD-10-CM | POA: Diagnosis not present

## 2023-02-04 DIAGNOSIS — Y842 Radiological procedure and radiotherapy as the cause of abnormal reaction of the patient, or of later complication, without mention of misadventure at the time of the procedure: Secondary | ICD-10-CM | POA: Diagnosis not present

## 2023-02-04 DIAGNOSIS — Z86718 Personal history of other venous thrombosis and embolism: Secondary | ICD-10-CM | POA: Diagnosis not present

## 2023-02-04 DIAGNOSIS — Z09 Encounter for follow-up examination after completed treatment for conditions other than malignant neoplasm: Secondary | ICD-10-CM | POA: Diagnosis not present

## 2023-02-04 DIAGNOSIS — Z853 Personal history of malignant neoplasm of breast: Secondary | ICD-10-CM | POA: Insufficient documentation

## 2023-02-04 DIAGNOSIS — L598 Other specified disorders of the skin and subcutaneous tissue related to radiation: Secondary | ICD-10-CM | POA: Diagnosis not present

## 2023-02-04 DIAGNOSIS — Z9221 Personal history of antineoplastic chemotherapy: Secondary | ICD-10-CM | POA: Diagnosis not present

## 2023-02-04 DIAGNOSIS — L589 Radiodermatitis, unspecified: Secondary | ICD-10-CM | POA: Diagnosis not present

## 2023-02-04 NOTE — Progress Notes (Addendum)
[name], [name] ([id number]) [id number] Page 1 of 10 Visit Report for 02/04/2023 Chief Complaint Document Details Patient Name: Date of Service: [name] 02/04/2023 9:30 A M Medical Record Number: [id number] Patient Account Number: [id number] Date of Birth/Sex: Treating RN: [birth year] (72 y.o. 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 [name], [name] Primary Care Provider: Charlott Rakes Other Clinician: Referring Provider: Treating Provider/Extender: Roxanna Mew, [name]: 128 Debridement Performed for Assessment: Wound #1 Right Breast (mastectomy site) Performed By: Physician Lenda Kelp, PA Debridement Type: Debridement Level of Consciousness (Pre-procedure): Awake and Alert Pre-procedure Verification/Time Out Yes - 10:05 Taken: Start Time: 10:06 Pain Control: Lidocaine 4% T opical Solution Percent of Wound Bed Debrided: 100% T Area Debrided (cm): otal 2.94 Tissue and other material debrided: Viable, Non-Viable, Slough, Subcutaneous, Biofilm, Slough Level: Skin/Subcutaneous Tissue Debridement Description: Excisional Instrument: Curette Bleeding: Minimum Hemostasis Achieved: Pressure End Time: 10:09 Procedural Pain: 0 Post Procedural Pain: 0 Response to Treatment:  Procedure was tolerated well Level of Consciousness (Post- Awake and Alert procedure): Post Debridement Measurements of Total Wound Length: (cm) 1.5 Width: (cm) 2.5 Depth: (cm) 0.3 Volume: (cm) 0.884 Character of Wound/Ulcer Post Debridement: Improved Post Procedure Diagnosis Same as Pre-procedure Electronic Signature(s) Signed: 02/04/2023 5:22:52 PM By: Allen Derry PA-C Signed: 02/05/2023 4:51:53 PM By: Shawn Stall RN, BSN Entered By: Shawn Stall on 02/04/2023 10:10:13 [name] ([id number]) [id number] Page 2 of 10 -------------------------------------------------------------------------------- HPI Details Patient Name: Date of Service: [name], [name] 02/04/2023 9:30 A M Medical Record Number: [id number] Patient Account Number: [id number] Date of Birth/Sex: Treating RN: 1951-01-16 (72 y.o. F) Primary Care Provider: Charlott Rakes Other Clinician: Referring Provider: Treating Provider/Extender: Roxanna Mew, Para March Weeks in Treatment: 128 History of Present Illness HPI Description: 08/22/2020 upon evaluation today patient actually appears to be doing somewhat poorly in regard to her mastectomy site on the right chest wall. She had a mastectomy initially in 1998. She had chemotherapy only no radiation following. In 2002 she subsequently did undergo radiation for the first time and then subsequently in 2012 had repeat radiation after having had a finding that was consistent with a relapse of the cancer. 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 She also has radiation damage to the skin over the right chest wall where she had the mastectomy. She was referred to [place] by Dr. Marcelle Overlie in [facility] and her oncologist is Dr. Arlan Organ. Subsequently I want to make sure before we delve into this more deeply that the patient does not have any issues with a return of cancer that needs to be  managed by them. 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 Nonetheless she does not remember exactly when her last PET scan was but it was not too recently she tells me. 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  The patient does have hypertension as well. She sees her oncologist next on December 6. 09/12/2020 on evaluation today patient actually appears to be doing excellent in regard to her wound under the right breast location. Currently this is measuring smaller in general seems to be doing very well. She tells me that the collagen is doing a good job and that the dressing that she has been putting on is not causing any troubles as far pulling on her skin or scar tissue is concerned all of which is excellent news. 10/03/2020 upon evaluation today patient appears to be doing well with regard to her surgical site from her mastectomy on the right breast region. She tells me that she has had very little bleeding nothing seems to be sticking badly and in general she has been extremely pleased with where things stand today. No fevers, chills, nausea, vomiting, or diarrhea. 10/24/2020 upon evaluation today patient actually appears to be doing excellent in regard to her wound. There is just a very small area still open and to be honest there was minimal slough noted on the surface of the wound nothing that requires sharp debridement. In general I feel like she is actually doing quite well and overall I am pleased. I do think we may switch to silver alginate dressing and also contemplating using a AandE ointment in order to help keep everything moist and the scar tissue region while the alginate keeps it dry enough to heal 11/14/2020 upon evaluation today patient appears to be doing well at this point in regard to her wound. I do feel like that she  is making good progress again this is a very difficult region over the surgical site of the right chest wall at the site of mastectomy. Nonetheless I think that we are just a very small area still open I think alginate is doing a good job helping to dry this up and I would recommend this such that we continue with this. 01/02/2021 on evaluation today patient's wound actually appears to be doing decently well. There does not appear to be any signs of active infection which is great news. She does have some slight slough buildup with biofilm on the surface of the wound mainly more biofilm. Nonetheless I was able to gently remove this with saline and gauze as well as a sterile Q-tip. She tolerated that today without complication. 01/30/2021 upon evaluation today patient appears to be doing well with regard to her wound although she still has quite a bit of trouble getting this to close I think the scar tissue and radiation damage is quite significant here unfortunately. There does not appear to be any signs of active infection which is great news. No fevers, chills, nausea, vomiting, or diarrhea. 02/27/2021 upon evaluation today patient appears to be doing excellent in regard to her wound. She has been tolerating the dressing changes without complication and in general I am extremely pleased with where things stand today. There does not appear to be any signs of infection which is great news. No fevers, chills, nausea, vomiting, or diarrhea. With that being said I do think that she still developing some slough buildup. I did discuss with her today the possibility of proceeding with HBO therapy. We have had this discussion before but she really is not quite committed to wanting to do that much and spend that much time in the chamber. She wants to still think about it. 03/27/2021 upon evaluation today patient appears to be doing about the  same in regard to her wound. She still developing a lot of slough buildup  on the surface of the wound. I did try to clean that away to some degree today. She tolerated that without any pain or complication. Subsequently I am thinking we may want to switch over to [facility] see if this may do better for her. Patient is in agreement with giving that a trial. 05/01/2021 upon evaluation today patient appears to be doing about the same in regard to her wounds. She has been tolerating the dressing changes without complication. Fortunately there does not appear to be any signs of active infection at this time which is great news. No fevers, chills, nausea, vomiting, or diarrhea.. 06/05/2021 upon evaluation today patient appears to be doing pretty well currently in regard to her wound at the mastectomy site right chest wall. Overall since we switch back to the alginate she tells me things have significantly improved she is much happier with where things stand currently. Fortunately there does not appear to be any signs of active infection which is great news. No fevers, chills, nausea, vomiting, or diarrhea. 07/10/2021 upon evaluation today patient unfortunately does not appear to be doing nearly as well as what she previously was. There does not appear to be any evidence of new epithelial growth she has a lot of necrotic tissue the base of the wound this is much more significant than what we previously noted. She also has been having increased pain and increased drainage. In general I am very concerned about this especially in light of the fact that she does have a history of breast cancer I want to ensure that were not looking at any cancerous type lesion at this point. Otherwise also think she does need some debridement we did do MolecuLight scanning today. 07/17/2021 upon evaluation today patient appears to be doing well with regard to her wound compared to last week although she still having some erythema I am concerned in this regard. I do think that she fortunately had a negative  biopsy which is great news unfortunately she did have Pseudomonas noted as a bacteria present in the culture which I think is good and need to be addressed with Levaquin. I Minna send that into the pharmacy today. We did repeat the MolecuLight screening. 07/31/2021 upon evaluation today patient's wound is actually showing signs of improvement which is great news. Fortunately there does not appear to be any signs of infection currently locally nor systemically and I am very pleased in that regard. With that being said the patient is having some issues here with continued necrotic tissue I think packing with the Dakin's moistened gauze dressing is still in the right leg ago. 08/14/2021 upon evaluation today patient appears to be doing well with regard to her wound all things considered I do not see any signs of significant infection which is great news. Fortunately there does not appear to be any evidence of infection which is also great news. In general I think that the patient is tolerating the dressing changes well. We been using a Dakin's moistened gauze. 08/28/2021 upon evaluation today patient appears to be doing well with regard to her wound. Worsening signs of definite improvement which is great news and overall very pleased with where we stand today. There is no signs of inflamed tissue or infection which is great as well and I think the Dakin's is doing a great [place], Lucendia Herrlich ([id number]) [id number] Page 3 of 10 job here. 09/11/2021 upon evaluation  today patient appears to be doing well with regard to her wound all things considered. I am can perform some debridement today to clear away some of the necrotic debris with that being said overall I think that she is making decently good progress here which is great news. There does not appear to be any signs of active infection also good news. That in fact is probably the best news in her mind. 09/25/2021 upon evaluation  today patient appears to be doing decently well in regard to her wound. Overall I do not see any signs of infection and I think she is doing quite well. I do believe that we are making headway here although is very slow due to the scarring and the depth of the wound this is a significantly deep wound. 10/16/2021 upon evaluation today patient appears to be doing about the same in regard to the wound on her right chest wall. Fortunately there does not appear to be any signs of active infection locally nor systemically which is great news. With that being said this still was not cleaning up quite as quickly as I would like to see. Fortunately I think that we can definitely give the Santyl another try we tried this in the past and unfortunately did not have a good result but again I think she was infected at that time as well which was part of the issue. Nonetheless I think currently that we will be able to go ahead and see what we can do about getting this little cleaner with the Santyl. 11/06/2021 upon evaluation today patient appears to be doing well with regard to her wound. This actually appears to be a little bit better with the Santyl I am happy in that regard. Fortunately I do not see any signs of active infection at this time. No fevers, chills, nausea, vomiting, or diarrhea. 12/04/2021 upon evaluation patient appears to be doing well with the Santyl at this point. I am very pleased with where we stand and I think that she is making good progress here. Fortunately I do not see any evidence of active infection locally or systemically at this time which is great news. No fevers, chills, nausea, vomiting, or diarrhea. 01/01/2022 upon evaluation today patient actually is making excellent progress with the Santyl. I am extremely pleased with where we stand today. I do not see any signs of infection. 01-29-2022 upon evaluation today patient appears to be doing well with regard to her wound. This is showing  signs of some improvement. It is a little less deep than what it was. Size wise is also slightly smaller but again there still is a significant wound in this area. Fortunately I do not see any evidence of infection and she is doing quite well. 02-26-2022 upon evaluation today patient appears to be doing well with regard to her wound. In fact this is actually the best that I have seen in a very long time. I do not see any evidence of active infection at this time locally or systemically which is great news and overall I think we are on the right track. Nonetheless I do believe that the patient is continuing to show evidence of improvement with the Santyl week by week. 03-26-2022 upon evaluation today patient appears to be doing well with regard to the wound on her right mastectomy site. She is actually showing signs of excellent improvement in fact there was some bleeding just with a little bit of light cleaning over the area.  Fortunately I do not see any signs of active infection locally or systemically at this time which is great news. 04-30-2022 upon evaluation today patient appears to be doing well currently in regard to her wound. She is actually showing some signs of improvement here which is great news. Still this is very very slow. Subsequently I do believe that she may benefit from the use of [place] topical antibiotics for short amount of time before applying a skin substitute I think EpiCord could be doing very well for her as far as that is concerned. I discussed that with her today. Fortunately I do not see any evidence of active infection locally or systemically at this time which is great news. No fevers, chills, nausea, vomiting, or diarrhea. 05-28-2022 upon evaluation today patient's wound is actually showing signs of significant improvement. Fortunately I do not see any evidence of infection at this time which is great news. No fevers, chills, nausea, vomiting, or diarrhea. With that  being said I do believe that the patient is tolerating the dressing changes without complication. We been using the [facility] which I think is helpful. We do have approval for the [place]. 06-11-2022 upon evaluation today patient appears to be doing well with regard to her wound she is actually here today for the first application of [place] which I think is good to be beneficial for her. I do think that we will probably be able to keep this in place without can be the one thing of question to be honest. 06-18-2022 upon evaluation today patient actually appears to be doing excellent today. She has 1 treatment of the EpiCord underway and the wound surface already looks much better. This will be EpiCord #2 today. Fortunately I think we are on the right track here. 06-25-2022 upon evaluation today patient appears to be making good progress and overall the patient seems to have tolerated EpiCord without any complication. I do feel like that the wound is improving quite significantly. This is EpiCord #3 today. 07-02-2021 upon evaluation today patient's wound is actually showing signs of improvement. I am actually very pleased with where we stand we have been seeing some improvements in size overall and I think that we are still in the right track although there is a little bit of need for sharp debridement today to clearway some of the necrotic debris and remaining EpiCord which is actually still somewhat adhered to the wound bed. Overall I am extremely pleased though with where we stand. This is EpiCord #4 today. Upon evaluation today patient appears to be doing well with regard to her wound the EpiCord is definitely helping and does seem to be improving the overall status of the wound the surface is dramatically improved compared to what it was at the start of this. The tissue is actually becoming more red than it is just a pale pink and to be honest some of the did not even appear to be pink in the start.  Nonetheless I am very pleased with where we stand currently. No fevers, chills, nausea, vomiting, or diarrhea. 07-16-2022 upon evaluation today patient appears to be doing well currently in regard to her wound she is doing well with the [place] this is application #6 today. 07-23-2022 upon evaluation today patient's wound actually showing signs of excellent improvement which is great news. Fortunately I think were making progress again this is a very scarred area which now has a pretty good vascular supply which is great news. Overall I think that  she is making great progress. This is EpiCord #7 today 10/18; Epicort No. 8. Very minimally smaller. Wounds on the right breast mastectomy site complicated by soft tissue radiation damage. Unfortunately she lives in [place], 5 days a week transport would be impossible for her to arrange 08-06-2022 upon evaluation today patient's wound is showing signs again of minimal improvement with regard to the wound bed again this is something that would probably respond well to hyperbaric oxygen therapy and I think should we can probably get this approved but she is just not able to make the transportation from [place] here daily for the 82-month period of time. Nonetheless I do think that she seems to be doing much better with regard to the surface of the wound and I am pleased that regard I am going to go ahead and continue with the EpiCord this is #9 application today. 08-13-2022 upon evaluation today patient's wound again is showing signs of improved quality to the tissue in the base of the wound. Fortunately I do not see any evidence of infection which is great news and overall I am extremely pleased in that regard. In general I do believe that the patient is making progress here. No fevers, chills, nausea, vomiting, or diarrhea. She is here today for EpiCord application #10 11/8; patient with a wound on the lower part of the right breast area. She is apparently  had a nice response to epi cord but she has completed this. It is difficult not to look over this lady's history and look at the wound and wonder about the known benefits of hyperbaric oxygen for soft tissue radionecrosis however she is unable to make transportation arrangements. She lives in [place] 08-27-2022 upon evaluation today patient appears to be doing well in regard to her wound. She has been tolerating the dressing changes without complication wheezing using the silver collagen over the past week and she tolerated that without complication. Fortunately I see no evidence of active infection locally or systemically at this time. RENEKA, NEBERGALL ([id number]) [id number] Page 4 of 10 09-17-2022 upon evaluation today patient appears to be doing well currently in regard to her wound. She has been tolerating the dressing changes without complication. Fortunately there does not appear to be any signs of active infection locally or systemically at this time which is great news. No fevers, chills, nausea, vomiting, or diarrhea. 10-15-2022 upon evaluation today patient appears to be doing well currently in regard to her wound. This is showing signs of good improvement which is great news and overall I am extremely pleased with where we stand today. Fortunately there does not appear to be any signs of active infection at this time which is great news as well. No fevers, chills, nausea, vomiting, or diarrhea. 11-12-2022 upon evaluation today patient appears to be doing pretty well in regard to her wound. She has been tolerating the dressing changes without complication and fortunately there does not appear to be any signs of active infection locally nor systemically which is great news. No fevers, chills, nausea, vomiting, or diarrhea. 12-10-2022 upon evaluation today patient appears to be doing well currently in regard to her wound. She has been tolerating the dressing changes  without complication. Fortunately there does not appear to be any signs of active infection locally nor systemically which is great news. No fevers, chills, nausea, vomiting, or diarrhea. 01-07-2023 upon evaluation today patient appears to be doing actually better in regard to her wound I am actually very pleased with where  things stand. The size is about the same but the base of the wound actually appears to be healthier with more red granulation tissue and there is some slough and biofilm I am going to perform debridement to clear some of this wound today for continuing with collagen at this point she is doing a good job taking care of this. 02-04-2023 upon evaluation today patient appears to be doing well currently in regard to her wound. She has been tolerating the dressing changes without complication in general I really feel like they were making excellent progress here. Fortunately I do not see any signs of active infection locally or systemically which is great news. She is going require some debridement but overall I think that though slow this still continues to make some progress little by little. Electronic Signature(s) Signed: 02/04/2023 1:10:15 PM By: Allen Derry PA-C Entered By: Allen Derry on 02/04/2023 13:10:15 -------------------------------------------------------------------------------- Physical Exam Details Patient Name: Date of Service: [name], [name] 02/04/2023 9:30 A M Medical Record Number: [id number] Patient Account Number: [id number] Date of Birth/Sex: Treating RN: 08/02/1951 (72 y.o. F) Primary Care Provider: Charlott Rakes Other Clinician: Referring Provider: Treating Provider/Extender: Roxanna Mew, [name]: 128 Constitutional [name] and well-hydrated in no acute distress. Respiratory normal breathing without difficulty. Psychiatric this patient is able to make decisions and demonstrates good insight into disease process.  Alert and Oriented x 3. pleasant and cooperative. Notes Upon inspection patient's wound bed actually showed signs of good granulation and epithelization at this point. Fortunately I do not see any evidence of active infection locally or systemically which is great news and overall I am extremely pleased with where we stand at this point. I think that the debridement went very well postdebridement the wound surface looks much improved and overall I am very pleased with how things are tracking currently. Electronic Signature(s) Signed: 02/04/2023 1:10:47 PM By: Allen Derry PA-C Entered By: Allen Derry on 02/04/2023 13:10:47 -------------------------------------------------------------------------------- Physician Orders Details Patient Name: Date of Service: [name], [name] 02/04/2023 9:30 A M Medical Record Number: [id number] Patient Account Number: [id number] Date of Birth/Sex: Treating RN: [birth year] (72 y.o. [name] Primary Care Provider: Charlott Rakes Other Clinician: Referring Provider: Treating Provider/Extender: Roxanna Mew, [name]: [id number] Verbal / Phone Orders: No Diagnosis Coding ICD-10 Coding Code Description CHALESE, PEACH ([id number]) [id number] Page 5 of 10 L98.492 Non-pressure chronic ulcer of skin of other sites with fat layer exposed L58.9 Radiodermatitis, unspecified L59.8 Other specified disorders of the skin and subcutaneous tissue related to radiation I10 Essential (primary) hypertension Follow-up Appointments Return appointment in 1 month. Junious Silk, PA on Wednesday's 0930 03/04/2023 room 8 Anesthetic (In clinic) Topical Lidocaine 5% applied to wound bed Cellular or Tissue Based Products daptic or Mepitel. (DO NOT REMOVE). - Cellular or Tissue Based Product applied to wound bed, secured with steri-strips, cover with A Epicord #1 06/11/22, #2 06/18/22, #3 06/25/22, #4 07/02/22, #5 07/09/22, #6 07/16/22,  Epicord #7 07/23/22, Epicord #8 07/30/22, Epicord #9 08/06/2022. Epicord # 10 08/13/22 - no more after 10th application, per PA we will see how she does after this with collagen for now. Additional Orders / Instructions Follow Nutritious Diet Wound Treatment Wound #1 - Breast (mastectomy site) Wound Laterality: Right Cleanser: Wound Cleanser (Generic) Every Other Day/30 Days Discharge Instructions: Cleanse the wound with wound cleanser prior to applying a clean dressing using gauze sponges, not tissue or cotton balls. Cleanser: [place]ram Ancillary Kit - 15  Day Supply (DME) (Generic) Every Other Day/30 Days Discharge Instructions: Use supplies as instructed; Kit contains: (15) Saline Bullets; (15) 3x3 Gauze; 15 pr Gloves Cleanser: Soap and Water Every Other Day/30 Days Discharge Instructions: May shower and wash wound with dial antibacterial soap and water prior to dressing change. Peri-Wound Care: Skin Prep (Generic) Every Other Day/30 Days Discharge Instructions: Use skin prep as directed Prim Dressing: Promogran Prisma Matrix, 4.34 (sq in) (silver collagen) (DME) (Generic) Every Other Day/30 Days ary Discharge Instructions: Moisten collagen with saline or hydrogel Secondary Dressing: Woven Gauze Sponge, Non-Sterile 4x4 in (DME) (Generic) Every Other Day/30 Days Discharge Instructions: Apply over primary dressing as directed. Secondary Dressing: Woven Gauze Sponges 2x2 in (DME) (Generic) Every Other Day/30 Days Discharge Instructions: Apply over primary dressing, fold in corners to fill the space Secondary Dressing: Zetuvit Plus Silicone Border Dressing 5x5 (in/in) (DME) (Generic) Every Other Day/30 Days Discharge Instructions: Apply silicone border over primary dressing as directed. Electronic Signature(s) Signed: 02/04/2023 5:22:52 PM By: Allen Derry PA-C Signed: 02/05/2023 4:51:53 PM By: Shawn Stall RN, BSN Entered By: Shawn Stall on 02/04/2023  10:13:51 -------------------------------------------------------------------------------- Problem List Details Patient Name: Date of Service: [name], [name] 02/04/2023 9:30 A M Medical Record Number: [id number] Patient Account Number: [id number] Date of Birth/Sex: Treating RN: 1951/04/14 (72 y.o. F) Primary Care Provider: Charlott Rakes Other Clinician: Referring Provider: Treating Provider/Extender: Roxanna Mew, Para March Weeks in Treatment: 128 Active Problems ICD-10 Encounter Code Description Active Date MDM Diagnosis L98.492 Non-pressure chronic ulcer of skin of other sites with fat layer exposed 08/22/2020 No Yes L58.9 Radiodermatitis, unspecified 08/22/2020 No Yes DEVONIA, FARRO ([id number]) [id number] Page 6 of 10 L59.8 Other specified disorders of the skin and subcutaneous tissue related to 08/22/2020 No Yes radiation I10 Essential (primary) hypertension 08/22/2020 No Yes Inactive Problems Resolved Problems Electronic Signature(s) Signed: 02/04/2023 9:44:11 AM By: Allen Derry PA-C Entered By: Allen Derry on 02/04/2023 09:44:10 -------------------------------------------------------------------------------- Progress Note Details Patient Name: Date of Service: [name], [name] 02/04/2023 9:30 A M Medical Record Number: [id number] Patient Account Number: [id number] Date of Birth/Sex: Treating RN: 16-May-1951 (72 y.o. 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 She had a mastectomy initially in 1998. She had chemotherapy only  no radiation following. In 2002 she subsequently did undergo radiation for the first time and then subsequently in 2012 had repeat radiation after having had a finding that was consistent with a relapse of the cancer. 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 She also has radiation damage to the skin over the right chest wall where she had the mastectomy. She was referred to [place] by Dr. Marcelle Overlie in [facility] and her oncologist is Dr. Arlan Organ. Subsequently I want to make sure before we delve into this more deeply that the patient does not have any issues with a return of cancer that needs to be managed by them. 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 Nonetheless she does not remember exactly when her last PET scan was but  it was not too recently she tells me. 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 The patient does have hypertension as well. She sees her oncologist next on December 6. 09/12/2020 on evaluation today patient actually appears to be doing excellent in regard to her wound under the right breast location. Currently this is measuring smaller in general seems to be doing very well. She tells me that the collagen is doing a good job and that the dressing that she has been putting on is not causing any troubles as far pulling on her skin or scar tissue is concerned all of which is excellent news. 10/03/2020 upon evaluation today patient appears to be doing well with regard to her surgical site from her mastectomy on the right breast region. She tells me that she has had very little bleeding nothing seems to be sticking badly and in general she has been extremely pleased with where things stand today. No fevers, chills, nausea, vomiting, or diarrhea. 10/24/2020 upon  evaluation today patient actually appears to be doing excellent in regard to her wound. There is just a very small area still open and to be honest there was minimal slough noted on the surface of the wound nothing that requires sharp debridement. In general I feel like she is actually doing quite well and overall I am pleased. I do think we may switch to silver alginate dressing and also contemplating using a AandE ointment in order to help keep everything moist and the scar tissue region while the alginate keeps it dry enough to heal 11/14/2020 upon evaluation today patient appears to be doing well at this point in regard to her wound. I do feel like that she is making good progress again this is a very difficult region over the surgical site of the right chest wall at the site of mastectomy. Nonetheless I think that we are just a very small area still open I think alginate is doing a good job helping to dry this up and I would recommend this such that we continue with this. 01/02/2021 on evaluation today patient's wound actually appears to be doing decently well. There does not appear to be any signs of active infection which is great news. She does have some slight slough buildup with biofilm on the surface of the wound mainly more biofilm. Nonetheless I was able to gently remove this with saline and gauze as well as a sterile Q-tip. She tolerated that today without complication. 01/30/2021 upon evaluation today patient appears to be doing well with regard to her wound although she still has quite a bit of trouble getting this to close I think the scar tissue and radiation damage is quite significant here unfortunately. There does not appear to be any signs of active infection which is great news. No fevers, chills, nausea, vomiting, or diarrhea. 02/27/2021 upon evaluation today patient appears to be doing excellent in regard to her wound. She has been tolerating the dressing changes  without complication and in general I am extremely pleased with where things stand today. There does not appear to be any signs of infection which is great news. No fevers, chills, nausea, vomiting, or diarrhea. With that being said I do think that she still developing some slough buildup. I did discuss with her today the possibility of proceeding with HBO therapy. We have had this discussion before but she really is not quite committed to wanting to do that much and spend that  much time in the chamber. She wants to still think about it. 03/27/2021 upon evaluation today patient appears to be doing about the same in regard to her wound. She still developing a lot of slough buildup on the surface of the wound. I did try to clean that away to some degree today. She tolerated that without any pain or complication. Subsequently I am thinking we may want ANWYN, KRIEGEL ([id number]) [id number] Page 7 of 10 to switch over to [facility] see if this may do better for her. Patient is in agreement with giving that a trial. 05/01/2021 upon evaluation today patient appears to be doing about the same in regard to her wounds. She has been tolerating the dressing changes without complication. Fortunately there does not appear to be any signs of active infection at this time which is great news. No fevers, chills, nausea, vomiting, or diarrhea.. 06/05/2021 upon evaluation today patient appears to be doing pretty well currently in regard to her wound at the mastectomy site right chest wall. Overall since we switch back to the alginate she tells me things have significantly improved she is much happier with where things stand currently. Fortunately there does not appear to be any signs of active infection which is great news. No fevers, chills, nausea, vomiting, or diarrhea. 07/10/2021 upon evaluation today patient unfortunately does not appear to be doing nearly as well as what she previously was. There  does not appear to be any evidence of new epithelial growth she has a lot of necrotic tissue the base of the wound this is much more significant than what we previously noted. She also has been having increased pain and increased drainage. In general I am very concerned about this especially in light of the fact that she does have a history of breast cancer I want to ensure that were not looking at any cancerous type lesion at this point. Otherwise also think she does need some debridement we did do MolecuLight scanning today. 07/17/2021 upon evaluation today patient appears to be doing well with regard to her wound compared to last week although she still having some erythema I am concerned in this regard. I do think that she fortunately had a negative biopsy which is great news unfortunately she did have Pseudomonas noted as a bacteria present in the culture which I think is good and need to be addressed with Levaquin. I Minna send that into the pharmacy today. We did repeat the MolecuLight screening. 07/31/2021 upon evaluation today patient's wound is actually showing signs of improvement which is great news. Fortunately there does not appear to be any signs of infection currently locally nor systemically and I am very pleased in that regard. With that being said the patient is having some issues here with continued necrotic tissue I think packing with the Dakin's moistened gauze dressing is still in the right leg ago. 08/14/2021 upon evaluation today patient appears to be doing well with regard to her wound all things considered I do not see any signs of significant infection which is great news. Fortunately there does not appear to be any evidence of infection which is also great news. In general I think that the patient is tolerating the dressing changes well. We been using a Dakin's moistened gauze. 08/28/2021 upon evaluation today patient appears to be doing well with regard to her wound.  Worsening signs of definite improvement which is great news and overall very pleased with where we stand today. There is no signs of  inflamed tissue or infection which is great as well and I think the Dakin's is doing a great job here. 09/11/2021 upon evaluation today patient appears to be doing well with regard to her wound all things considered. I am can perform some debridement today to clear away some of the necrotic debris with that being said overall I think that she is making decently good progress here which is great news. There does not appear to be any signs of active infection also good news. That in fact is probably the best news in her mind. 09/25/2021 upon evaluation today patient appears to be doing decently well in regard to her wound. Overall I do not see any signs of infection and I think she is doing quite well. I do believe that we are making headway here although is very slow due to the scarring and the depth of the wound this is a significantly deep wound. 10/16/2021 upon evaluation today patient appears to be doing about the same in regard to the wound on her right chest wall. Fortunately there does not appear to be any signs of active infection locally nor systemically which is great news. With that being said this still was not cleaning up quite as quickly as I would like to see. Fortunately I think that we can definitely give the Santyl another try we tried this in the past and unfortunately did not have a good result but again I think she was infected at that time as well which was part of the issue. Nonetheless I think currently that we will be able to go ahead and see what we can do about getting this little cleaner with the Santyl. 11/06/2021 upon evaluation today patient appears to be doing well with regard to her wound. This actually appears to be a little bit better with the Santyl I am happy in that regard. Fortunately I do not see any signs of active infection at this  time. No fevers, chills, nausea, vomiting, or diarrhea. 12/04/2021 upon evaluation patient appears to be doing well with the Santyl at this point. I am very pleased with where we stand and I think that she is making good progress here. Fortunately I do not see any evidence of active infection locally or systemically at this time which is great news. No fevers, chills, nausea, vomiting, or diarrhea. 01/01/2022 upon evaluation today patient actually is making excellent progress with the Santyl. I am extremely pleased with where we stand today. I do not see any signs of infection. 01-29-2022 upon evaluation today patient appears to be doing well with regard to her wound. This is showing signs of some improvement. It is a little less deep than what it was. Size wise is also slightly smaller but again there still is a significant wound in this area. Fortunately I do not see any evidence of infection and she is doing quite well. 02-26-2022 upon evaluation today patient appears to be doing well with regard to her wound. In fact this is actually the best that I have seen in a very long time. I do not see any evidence of active infection at this time locally or systemically which is great news and overall I think we are on the right track. Nonetheless I do believe that the patient is continuing to show evidence of improvement with the Santyl week by week. 03-26-2022 upon evaluation today patient appears to be doing well with regard to the wound on her right mastectomy site. She is actually  showing signs of excellent improvement in fact there was some bleeding just with a little bit of light cleaning over the area. Fortunately I do not see any signs of active infection locally or systemically at this time which is great news. 04-30-2022 upon evaluation today patient appears to be doing well currently in regard to her wound. She is actually showing some signs of improvement here which is great news. Still this is  very very slow. Subsequently I do believe that she may benefit from the use of Keystone topical antibiotics for short amount of time before applying a skin substitute I think EpiCord could be doing very well for her as far as that is concerned. I discussed that with her today. Fortunately I do not see any evidence of active infection locally or systemically at this time which is great news. No fevers, chills, nausea, vomiting, or diarrhea. 05-28-2022 upon evaluation today patient's wound is actually showing signs of significant improvement. Fortunately I do not see any evidence of infection at this time which is great news. No fevers, chills, nausea, vomiting, or diarrhea. With that being said I do believe that the patient is tolerating the dressing changes without complication. We been using the [facility] which I think is helpful. We do have approval for the EpiCord. 06-11-2022 upon evaluation today patient appears to be doing well with regard to her wound she is actually here today for the first application of EpiCord which I think is good to be beneficial for her. I do think that we will probably be able to keep this in place without can be the one thing of question to be honest. 06-18-2022 upon evaluation today patient actually appears to be doing excellent today. She has 1 treatment of the EpiCord underway and the wound surface already looks much better. This will be EpiCord #2 today. Fortunately I think we are on the right track here. 06-25-2022 upon evaluation today patient appears to be making good progress and overall the patient seems to have tolerated EpiCord without any complication. I do feel like that the wound is improving quite significantly. This is EpiCord #3 today. 07-02-2021 upon evaluation today patient's wound is actually showing signs of improvement. I am actually very pleased with where we stand we have been seeing some improvements in size overall and I think that we are still in the  right track although there is a little bit of need for sharp debridement today to clearway some of the necrotic debris and remaining EpiCord which is actually still somewhat adhered to the wound bed. Overall I am extremely pleased though with where we stand. This is EpiCord #4 today. ZOILA, DITULLIO ([id number]) [id number] Page 8 of 10 Upon evaluation today patient appears to be doing well with regard to her wound the EpiCord is definitely helping and does seem to be improving the overall status of the wound the surface is dramatically improved compared to what it was at the start of this. The tissue is actually becoming more red than it is just a pale pink and to be honest some of the did not even appear to be pink in the start. Nonetheless I am very pleased with where we stand currently. No fevers, chills, nausea, vomiting, or diarrhea. 07-16-2022 upon evaluation today patient appears to be doing well currently in regard to her wound she is doing well with the EpiCord this is application #6 today. 07-23-2022 upon evaluation today patient's wound actually showing signs of excellent improvement which is great  news. Fortunately I think were making progress again this is a very scarred area which now has a pretty good vascular supply which is great news. Overall I think that she is making great progress. This is EpiCord #7 today 10/18; Epicort No. 8. Very minimally smaller. Wounds on the right breast mastectomy site complicated by soft tissue radiation damage. Unfortunately she lives in [place], 5 days a week transport would be impossible for her to arrange 08-06-2022 upon evaluation today patient's wound is showing signs again of minimal improvement with regard to the wound bed again this is something that would probably respond well to hyperbaric oxygen therapy and I think should we can probably get this approved but she is just not able to make the transportation from  [place] here daily for the 32-month period of time. Nonetheless I do think that she seems to be doing much better with regard to the surface of the wound and I am pleased that regard I am going to go ahead and continue with the EpiCord this is #9 application today. 08-13-2022 upon evaluation today patient's wound again is showing signs of improved quality to the tissue in the base of the wound. Fortunately I do not see any evidence of infection which is great news and overall I am extremely pleased in that regard. In general I do believe that the patient is making progress here. No fevers, chills, nausea, vomiting, or diarrhea. She is here today for EpiCord application #10 11/8; patient with a wound on the lower part of the right breast area. She is apparently had a nice response to epi cord but she has completed this. It is difficult not to look over this lady's history and look at the wound and wonder about the known benefits of hyperbaric oxygen for soft tissue radionecrosis however she is unable to make transportation arrangements. She lives in [place] 08-27-2022 upon evaluation today patient appears to be doing well in regard to her wound. She has been tolerating the dressing changes without complication wheezing using the silver collagen over the past week and she tolerated that without complication. Fortunately I see no evidence of active infection locally or systemically at this time. 09-17-2022 upon evaluation today patient appears to be doing well currently in regard to her wound. She has been tolerating the dressing changes without complication. Fortunately there does not appear to be any signs of active infection locally or systemically at this time which is great news. No fevers, chills, nausea, vomiting, or diarrhea. 10-15-2022 upon evaluation today patient appears to be doing well currently in regard to her wound. This is showing signs of good improvement which is great news and overall I  am extremely pleased with where we stand today. Fortunately there does not appear to be any signs of active infection at this time which is great news as well. No fevers, chills, nausea, vomiting, or diarrhea. 11-12-2022 upon evaluation today patient appears to be doing pretty well in regard to her wound. She has been tolerating the dressing changes without complication and fortunately there does not appear to be any signs of active infection locally nor systemically which is great news. No fevers, chills, nausea, vomiting, or diarrhea. 12-10-2022 upon evaluation today patient appears to be doing well currently in regard to her wound. She has been tolerating the dressing changes without complication. Fortunately there does not appear to be any signs of active infection locally nor systemically which is great news. No fevers, chills, nausea, vomiting, or diarrhea. 01-07-2023  upon evaluation today patient appears to be doing actually better in regard to her wound I am actually very pleased with where things stand. The size is about the same but the base of the wound actually appears to be healthier with more red granulation tissue and there is some slough and biofilm I am going to perform debridement to clear some of this wound today for continuing with collagen at this point she is doing a good job taking care of this. 02-04-2023 upon evaluation today patient appears to be doing well currently in regard to her wound. She has been tolerating the dressing changes without complication in general I really feel like they were making excellent progress here. Fortunately I do not see any signs of active infection locally or

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
## 2023-02-11 DIAGNOSIS — I700 Atherosclerosis of aorta: Secondary | ICD-10-CM | POA: Diagnosis not present

## 2023-02-11 DIAGNOSIS — C50919 Malignant neoplasm of unspecified site of unspecified female breast: Secondary | ICD-10-CM | POA: Diagnosis not present

## 2023-03-04 ENCOUNTER — Encounter (HOSPITAL_BASED_OUTPATIENT_CLINIC_OR_DEPARTMENT_OTHER): Payer: Medicare Other | Admitting: Physician Assistant

## 2023-03-10 ENCOUNTER — Encounter: Payer: Self-pay | Admitting: Family

## 2023-03-10 ENCOUNTER — Inpatient Hospital Stay: Payer: Medicare Other

## 2023-03-10 ENCOUNTER — Inpatient Hospital Stay: Payer: Medicare Other | Attending: Hematology & Oncology | Admitting: Family

## 2023-03-10 VITALS — BP 151/54 | HR 72 | Temp 98.1°F | Resp 18 | Wt 192.8 lb

## 2023-03-10 DIAGNOSIS — D631 Anemia in chronic kidney disease: Secondary | ICD-10-CM | POA: Insufficient documentation

## 2023-03-10 DIAGNOSIS — N189 Chronic kidney disease, unspecified: Secondary | ICD-10-CM | POA: Insufficient documentation

## 2023-03-10 DIAGNOSIS — D508 Other iron deficiency anemias: Secondary | ICD-10-CM

## 2023-03-10 DIAGNOSIS — Z79811 Long term (current) use of aromatase inhibitors: Secondary | ICD-10-CM | POA: Diagnosis not present

## 2023-03-10 DIAGNOSIS — C50011 Malignant neoplasm of nipple and areola, right female breast: Secondary | ICD-10-CM

## 2023-03-10 DIAGNOSIS — D509 Iron deficiency anemia, unspecified: Secondary | ICD-10-CM | POA: Insufficient documentation

## 2023-03-10 DIAGNOSIS — C50911 Malignant neoplasm of unspecified site of right female breast: Secondary | ICD-10-CM | POA: Diagnosis not present

## 2023-03-10 DIAGNOSIS — Z7982 Long term (current) use of aspirin: Secondary | ICD-10-CM | POA: Diagnosis not present

## 2023-03-10 LAB — RETICULOCYTES
Immature Retic Fract: 11 % (ref 2.3–15.9)
RBC.: 3.99 MIL/uL (ref 3.87–5.11)
Retic Count, Absolute: 55.9 10*3/uL (ref 19.0–186.0)
Retic Ct Pct: 1.4 % (ref 0.4–3.1)

## 2023-03-10 LAB — CBC WITH DIFFERENTIAL (CANCER CENTER ONLY)
Abs Immature Granulocytes: 0.05 10*3/uL (ref 0.00–0.07)
Basophils Absolute: 0 10*3/uL (ref 0.0–0.1)
Basophils Relative: 1 %
Eosinophils Absolute: 0.4 10*3/uL (ref 0.0–0.5)
Eosinophils Relative: 6 %
HCT: 35.8 % — ABNORMAL LOW (ref 36.0–46.0)
Hemoglobin: 11.4 g/dL — ABNORMAL LOW (ref 12.0–15.0)
Immature Granulocytes: 1 %
Lymphocytes Relative: 32 %
Lymphs Abs: 2 10*3/uL (ref 0.7–4.0)
MCH: 28.9 pg (ref 26.0–34.0)
MCHC: 31.8 g/dL (ref 30.0–36.0)
MCV: 90.9 fL (ref 80.0–100.0)
Monocytes Absolute: 0.6 10*3/uL (ref 0.1–1.0)
Monocytes Relative: 9 %
Neutro Abs: 3.3 10*3/uL (ref 1.7–7.7)
Neutrophils Relative %: 51 %
Platelet Count: 194 10*3/uL (ref 150–400)
RBC: 3.94 MIL/uL (ref 3.87–5.11)
RDW: 14.6 % (ref 11.5–15.5)
WBC Count: 6.3 10*3/uL (ref 4.0–10.5)
nRBC: 0 % (ref 0.0–0.2)

## 2023-03-10 LAB — CMP (CANCER CENTER ONLY)
ALT: 11 U/L (ref 0–44)
AST: 14 U/L — ABNORMAL LOW (ref 15–41)
Albumin: 4.2 g/dL (ref 3.5–5.0)
Alkaline Phosphatase: 56 U/L (ref 38–126)
Anion gap: 6 (ref 5–15)
BUN: 17 mg/dL (ref 8–23)
CO2: 30 mmol/L (ref 22–32)
Calcium: 9.9 mg/dL (ref 8.9–10.3)
Chloride: 107 mmol/L (ref 98–111)
Creatinine: 0.68 mg/dL (ref 0.44–1.00)
GFR, Estimated: >60 mL/min (ref >60)
Glucose, Bld: 109 mg/dL — ABNORMAL HIGH (ref 70–99)
Potassium: 4.3 mmol/L (ref 3.5–5.1)
Sodium: 143 mmol/L (ref 135–145)
Total Bilirubin: 0.7 mg/dL (ref 0.3–1.2)
Total Protein: 7 g/dL (ref 6.5–8.1)

## 2023-03-10 LAB — IRON AND IRON BINDING CAPACITY (CC-WL,HP ONLY)
Iron: 65 ug/dL (ref 28–170)
Saturation Ratios: 27 % (ref 10.4–31.8)
TIBC: 238 ug/dL — ABNORMAL LOW (ref 250–450)
UIBC: 173 ug/dL (ref 148–442)

## 2023-03-10 LAB — FERRITIN: Ferritin: 519 ng/mL — ABNORMAL HIGH (ref 11–307)

## 2023-03-10 NOTE — Progress Notes (Signed)
Hematology and Oncology Follow Up Visit  [name] [id number] 05-04-1951 72 y.o. 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 daily - restarted on 09/23/2022 Aspirin 81 mg p.o. daily IV iron as needed Aranesp 300 mcg sq for Hgb less than 11    Interim History:  [name] is here today for follow-up. She is symptomatic with fatigue. She states that she has stayed quite busy with her husband. He is receiving chemotherapy for breast cancer and has had some issues with side effects.  She has lymphedema present in the right arm and states that she plans to use her compression sleeve more often. She is having to push her husband around in a wheel chair which she notes causes more swelling.  She is followed by wound care for the radionecrosis of the right breast. This is covered with a dry dressing today. She states that this continues to heal slowly.  No issue with infection. No fever, chills, n/v, cough, rash, dizziness, SOB, chest pain, palpitations, abdominal pain or changes in bowel or bladder habits.  She has chronic swelling in her lower extremities due to PVD (per patient). This is unchanged from baseline.  No falls or syncope reported.  Appetite and hydration are good. Weight is stable at 192 lbs.   ECOG Performance Status: 1 - Symptomatic but completely ambulatory  Medications:  Allergies as of 03/10/2023       Reactions   Contrast Media [iodinated Contrast Media] Hives, Shortness Of Breath, Nausea And Vomiting   Allergic reaction even after pre-meds.   Metrizamide Hives, Shortness Of Breath, Nausea And Vomiting   Allergic reaction even after pre-meds.   Other Shortness Of Breath, Nausea And Vomiting, Rash   Allergic reaction even after pre-meds.   Dye Fdc Blue [brilliant Blue  Fcf (fd&c Blue #1)] Hives, Nausea Only   Fd&c Blue #1 (brilliant Blue) Hives, Nausea Only        Medication List        Accurate as of Mar 10, 2023 10:46 AM. If you have any questions, ask your nurse or doctor.          amLODipine 10 MG tablet Commonly known as: NORVASC TAKE 1 TABLET BY MOUTH DAILY   amoxicillin 500 MG capsule Commonly known as: AMOXIL Before dental procedures   aspirin EC 81 MG tablet Take 81 mg by mouth daily.   candesartan 32 MG tablet Commonly known as: ATACAND Take 32 mg by mouth daily.   celecoxib 200 MG capsule Commonly known as: CELEBREX Take 200 mg by mouth daily as needed.   exemestane 25 MG tablet Commonly known as: AROMASIN TAKE 1 TABLET BY MOUTH EVERY DAY   Gentamicin Sulfate Powd Apply 1 Application topically daily.   hydrochlorothiazide 25 MG tablet Commonly known as: HYDRODIURIL Take 25 mg by mouth daily.   IBUPROFEN PO Take 400 mg by mouth 2 (two) times daily as needed.   metoprolol succinate 50 MG 24 hr tablet Commonly known as: TOPROL-XL Take 50 mg by mouth daily.   metoprolol tartrate 100 MG tablet Commonly known as: LOPRESSOR Take 100 mg by mouth daily.   simvastatin 20 MG tablet Commonly known as: ZOCOR Take 20 mg by mouth daily.   Vitamin D (Ergocalciferol) 1.25 MG (50000 UNIT) Caps capsule Commonly known as:  DRISDOL TAKE 1 CAPSULE BY MOUTH ONE TIME PER WEEK        Allergies:  Allergies  Allergen Reactions   Contrast Media [Iodinated Contrast Media] Hives, Shortness Of Breath and Nausea And Vomiting    Allergic reaction even after pre-meds.   Metrizamide Hives, Shortness Of Breath and Nausea And Vomiting    Allergic reaction even after pre-meds.   Other Shortness Of Breath, Nausea And Vomiting and Rash    Allergic reaction even after pre-meds.   Dye Fdc Blue [Brilliant Blue Fcf (Fd&C Blue #1)] Hives and Nausea Only   Fd&C Blue #1 (Brilliant Blue) Hives and Nausea Only    Past Medical History,  Surgical history, Social history, and Family History were reviewed and updated.  Review of Systems: All other 10 point review of systems is negative.   Physical Exam:  weight is 192 lb 12.8 oz (87.5 kg). Her oral temperature is 98.1 F (36.7 C). Her blood pressure is 151/54 (abnormal) and her pulse is 72. Her respiration is 18 and oxygen saturation is 100%.   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 Left breast exam negative.   Lab Results  Component Value Date   WBC 6.3 03/10/2023   HGB 11.4 (L) 03/10/2023   HCT 35.8 (L) 03/10/2023   MCV 90.9 03/10/2023   PLT 194 03/10/2023   Lab Results  Component Value Date   FERRITIN 465 (H) 12/15/2022   IRON 55 12/15/2022   TIBC 263 12/15/2022   UIBC 208 12/15/2022   IRONPCTSAT 21 12/15/2022   Lab Results  Component Value Date   RETICCTPCT 1.4 03/10/2023   RBC 3.99 03/10/2023   RETICCTABS 55.2 06/20/2011   No results found for: "KPAFRELGTCHN", "LAMBDASER", "KAPLAMBRATIO" No results found for: "IGGSERUM", "IGA", "IGMSERUM" Lab Results  Component Value Date   TOTALPROTELP 6.6 08/14/2010   ALBUMINELP 53.8 (L) 08/14/2010   A1GS 5.3 (H) 08/14/2010   A2GS 14.3 (H) 08/14/2010   BETS 5.8 08/14/2010   BETA2SER 6.3 08/14/2010   GAMS 14.5 08/14/2010   MSPIKE NOT DET 08/14/2010   SPEI *  08/14/2010     Chemistry      Component Value Date/Time   NA 143 03/10/2023 0955   NA 146 (H) 07/23/2017 0900   NA 144 02/18/2017 0901   K 4.3 03/10/2023 0955   K 3.7 07/23/2017 0900   K 3.7 02/18/2017 0901   CL 107 03/10/2023 0955   CL 107 07/23/2017 0900   CO2 30 03/10/2023 0955   CO2 31 07/23/2017 0900   CO2 27 02/18/2017 0901   BUN 17 03/10/2023 0955   BUN 16 07/23/2017 0900   BUN 17.7 02/18/2017 0901   CREATININE 0.68 03/10/2023 0955   CREATININE 0.8 07/23/2017 0900   CREATININE 0.7 02/18/2017 0901      Component Value Date/Time   CALCIUM 9.9 03/10/2023 0955   CALCIUM 9.3 07/23/2017 0900  CALCIUM 9.3 02/18/2017 0901   ALKPHOS 56 03/10/2023 0955   ALKPHOS 67 07/23/2017 0900   ALKPHOS 78 02/18/2017 0901   AST 14 (L) 03/10/2023 0955   AST 13 02/18/2017 0901   ALT 11 03/10/2023 0955   ALT 20 07/23/2017 0900   ALT 12 02/18/2017 0901   BILITOT 0.7 03/10/2023 0955   BILITOT 0.54 02/18/2017 0901       Impression and Plan: Ms. Mirenda is a very pleasant 72 yo caucasian female with history of locally recurrent adenocarcinoma of the right breast with resection and radiation.  Iron studies are pending.  No ESA given, Hgb 11.4  Continue same regimen with Aromasin.  Lab check and injection every 6 weeks. Follow-up in 3 months  Eileen Stanford, NP 5/28/202410:46 AM

## 2023-03-11 ENCOUNTER — Encounter (HOSPITAL_BASED_OUTPATIENT_CLINIC_OR_DEPARTMENT_OTHER): Payer: Medicare Other | Attending: Physician Assistant | Admitting: Physician Assistant

## 2023-03-11 DIAGNOSIS — I890 Lymphedema, not elsewhere classified: Secondary | ICD-10-CM | POA: Diagnosis not present

## 2023-03-11 DIAGNOSIS — L589 Radiodermatitis, unspecified: Secondary | ICD-10-CM | POA: Diagnosis not present

## 2023-03-11 DIAGNOSIS — Z853 Personal history of malignant neoplasm of breast: Secondary | ICD-10-CM | POA: Diagnosis not present

## 2023-03-11 DIAGNOSIS — L98492 Non-pressure chronic ulcer of skin of other sites with fat layer exposed: Secondary | ICD-10-CM | POA: Diagnosis not present

## 2023-03-11 DIAGNOSIS — Z9221 Personal history of antineoplastic chemotherapy: Secondary | ICD-10-CM | POA: Diagnosis not present

## 2023-03-11 DIAGNOSIS — Z86718 Personal history of other venous thrombosis and embolism: Secondary | ICD-10-CM | POA: Diagnosis not present

## 2023-03-11 DIAGNOSIS — L598 Other specified disorders of the skin and subcutaneous tissue related to radiation: Secondary | ICD-10-CM | POA: Diagnosis not present

## 2023-03-11 DIAGNOSIS — Z923 Personal history of irradiation: Secondary | ICD-10-CM | POA: Insufficient documentation

## 2023-03-11 DIAGNOSIS — Z9011 Acquired absence of right breast and nipple: Secondary | ICD-10-CM | POA: Diagnosis not present

## 2023-03-11 DIAGNOSIS — I10 Essential (primary) hypertension: Secondary | ICD-10-CM | POA: Insufficient documentation

## 2023-03-11 LAB — CANCER ANTIGEN 27.29: CA 27.29: 18.7 U/mL (ref 0.0–38.6)

## 2023-03-12 ENCOUNTER — Other Ambulatory Visit: Payer: Self-pay | Admitting: Hematology & Oncology

## 2023-03-14 DIAGNOSIS — C50919 Malignant neoplasm of unspecified site of unspecified female breast: Secondary | ICD-10-CM | POA: Diagnosis not present

## 2023-03-14 DIAGNOSIS — I700 Atherosclerosis of aorta: Secondary | ICD-10-CM | POA: Diagnosis not present

## 2023-04-08 ENCOUNTER — Encounter (HOSPITAL_BASED_OUTPATIENT_CLINIC_OR_DEPARTMENT_OTHER): Payer: Medicare Other | Attending: Physician Assistant | Admitting: Physician Assistant

## 2023-04-08 DIAGNOSIS — I890 Lymphedema, not elsewhere classified: Secondary | ICD-10-CM | POA: Insufficient documentation

## 2023-04-08 DIAGNOSIS — Z86718 Personal history of other venous thrombosis and embolism: Secondary | ICD-10-CM | POA: Diagnosis not present

## 2023-04-08 DIAGNOSIS — Y842 Radiological procedure and radiotherapy as the cause of abnormal reaction of the patient, or of later complication, without mention of misadventure at the time of the procedure: Secondary | ICD-10-CM | POA: Diagnosis not present

## 2023-04-08 DIAGNOSIS — Z853 Personal history of malignant neoplasm of breast: Secondary | ICD-10-CM | POA: Diagnosis not present

## 2023-04-08 DIAGNOSIS — L598 Other specified disorders of the skin and subcutaneous tissue related to radiation: Secondary | ICD-10-CM | POA: Diagnosis not present

## 2023-04-08 DIAGNOSIS — I10 Essential (primary) hypertension: Secondary | ICD-10-CM | POA: Diagnosis not present

## 2023-04-08 DIAGNOSIS — M1990 Unspecified osteoarthritis, unspecified site: Secondary | ICD-10-CM | POA: Insufficient documentation

## 2023-04-08 DIAGNOSIS — L98492 Non-pressure chronic ulcer of skin of other sites with fat layer exposed: Secondary | ICD-10-CM | POA: Diagnosis not present

## 2023-04-08 DIAGNOSIS — Z9221 Personal history of antineoplastic chemotherapy: Secondary | ICD-10-CM | POA: Diagnosis not present

## 2023-04-08 NOTE — Progress Notes (Addendum)
[name], [name] ([id number]) [id number] Page 1 of 6 Visit Report for 04/08/2023 Arrival Information Details Patient Name: Date of Service: [name] 04/08/2023 9:30 A M Medical Record Number: [id number] Patient Account Number: [id number] Date of Birth/Sex: Treating RN: Feb 05, 1951 (72 y.o. F) Primary Care [name]: [name] Other Clinician: Referring [name]: Treating [name]/Extender: [name], [name] in Treatment: 137 Visit Information History Since Last Visit All ordered tests and consults were completed: No Patient Arrived: Ambulatory Added or deleted any medications: No Arrival Time: 09:19 Any new allergies or adverse reactions: No Accompanied By: neighbor Had a fall or experienced change in No Transfer Assistance: None activities of daily living that may affect Patient Identification Verified: Yes risk of falls: Secondary Verification Process Completed: Yes Signs or symptoms of abuse/neglect since last visito No Patient Requires Transmission-Based Precautions: No Hospitalized since last visit: No Patient Has Alerts: No Implantable device outside of the clinic excluding No cellular tissue based products placed in the center since last visit: Pain Present Now: No Electronic Signature(s) Signed: 04/08/2023 9:39:58 AM By: [name] Entered By: [name] on 04/08/2023 09:20:09 -------------------------------------------------------------------------------- Encounter Discharge Information Details Patient Name: Date of Service: [name], [name] 04/08/2023 9:30 A M Medical Record Number: [id number] Patient Account Number: [id number] Date of Birth/Sex: Treating RN: Mar 30, 1951 (72 y.o. F) Primary Care [name]: [name] Other Clinician: Referring [name]: Treating [name]/Extender: [name], [name] in Treatment: [phone] Encounter Discharge Information Items Post Procedure Vitals Discharge  Condition: Stable Temperature (F): 98.4 Ambulatory Status: Ambulatory Pulse (bpm): 82 Discharge Destination: Home Respiratory Rate (breaths/min): 18 Transportation: Private Auto Blood Pressure (mmHg): 132/84 Accompanied By: self Schedule Follow-up Appointment: Yes Clinical Summary of Care: Patient Declined Electronic Signature(s) Signed: 04/13/2023 2:58:52 PM By: [name] Entered By: [name] on 04/08/2023 10:20:06 [name] ([id number]) [id number] Page 2 of 6 -------------------------------------------------------------------------------- Lower Extremity Assessment Details Patient Name: Date of Service: [name], [name] 04/08/2023 9:30 A M Medical Record Number: [id number] Patient Account Number: [id number] Date of Birth/Sex: Treating RN: 1951-06-05 (72 y.o. F) Primary Care [name]: [name] Other Clinician: Referring Tammee Thielke: Treating [name]/Extender: [name], [name] in Treatment: 137 Electronic Signature(s) Signed: 04/10/2023 11:18:15 AM By: [name] Entered By: [name] on 04/08/2023 09:48:01 -------------------------------------------------------------------------------- Multi-Disciplinary Care Plan Details Patient Name: Date of Service: [name], [name] 04/08/2023 9:30 A M Medical Record Number: [id number] Patient Account Number: [id number] Date of Birth/Sex: Treating RN: 10/03/1951 (72 y.o. F) Primary Care Shakiyah Cirilo: [name] Other Clinician: Referring [name]: Treating [name]/Extender: [name], [name] in Treatment: 137 Multidisciplinary Care Plan reviewed with physician Active Inactive Wound/Skin Impairment Nursing Diagnoses: Impaired tissue integrity Knowledge deficit related to ulceration/compromised skin integrity Goals: Patient/caregiver will verbalize understanding of skin care regimen Date Initiated: 08/22/2020 Target Resolution Date:  04/10/2023 Goal Status: Active Ulcer/skin breakdown will have a volume reduction of 30% by week 4 Date Initiated: 08/22/2020 Date Inactivated: 10/03/2020 Target Resolution Date: 09/19/2020 Goal Status: Met Ulcer/skin breakdown will have a volume reduction of 50% by week 8 Date Initiated: 06/05/2021 Date Inactivated: 07/17/2021 Target Resolution Date: 07/03/2021 Goal Status: Unmet Unmet Reason: infection Interventions: Assess patient/caregiver ability to obtain necessary supplies Assess patient/caregiver ability to perform ulcer/skin care regimen upon admission and as needed Assess ulceration(s) every visit Treatment Activities: Skin care regimen initiated : 08/22/2020 Topical wound management initiated : 08/22/2020 Notes: 06/05/21: Wound care regimen ongoing. 06/11/22: Wound care regimen continues, applying skin sub ([place]) Electronic Signature(s) Signed: 04/13/2023 2:58:52 PM  By: [name] Entered By: [name] on 04/08/2023 10:18:30 [name] ([id number]) [id number] Page 3 of 6 -------------------------------------------------------------------------------- Pain Assessment Details Patient Name: Date of Service: [name], [name] 04/08/2023 9:30 A M Medical Record Number: [id number] Patient Account Number: [id number] Date of Birth/Sex: Treating RN: [birth year] (72 y.o. F) Primary Care [name]: [name] Other Clinician: Referring Jameila Keeny: Treating Ceaser Ebeling/Extender: [name], [place] [name] in Treatment: [phone] Active Problems Location of Pain Severity and Description of Pain Patient Has Paino No Site Locations Pain Management and Medication Current Pain Management: Electronic Signature(s) Signed: 04/08/2023 9:39:58 AM By: [name] Entered By: [name] on 04/08/2023 09:20:35 -------------------------------------------------------------------------------- Patient/Caregiver Education Details Patient Name: Date of  Service: [name], [name] 6/26/2024andnbsp9:30 A M Medical Record Number: [id number] Patient Account Number: [id number] Date of Birth/Gender: Treating RN: [birth year] (72 y.o. F) Primary Care Physician: [name] Other Clinician: Referring Physician: Treating Physician/Extender: Dennis Bast in Treatment: [phone] Education Assessment Education Provided To: Patient Education Topics Provided Wound Debridement: [name], [name] ([id number]) [id number] Page 4 of 6 Electronic Signature(s) Signed: 04/13/2023 2:58:52 PM By: [name] Entered By: [name] on 04/08/2023 10:18:47 -------------------------------------------------------------------------------- Wound Assessment Details Patient Name: Date of Service: [name], [name] 04/08/2023 9:30 A M Medical Record Number: [id number] Patient Account Number: [id number] Date of Birth/Sex: Treating RN: 1951/01/15 (72 y.o. F) Primary Care De Jaworski: [name] Other Clinician: Referring Nicie Milan: Treating Holly Iannaccone/Extender: [name], [name] in Treatment: 137 Wound Status Wound Number: 1 Primary Soft Tissue Radionecrosis Etiology: Wound Location: Right Breast (mastectomy site) Wound Open Wounding Event: Radiation Burn Status: Date Acquired: 07/26/2020 Comorbid Anemia, Lymphedema, Deep Vein Thrombosis, Hypertension, [name] Of Treatment: 137 History: Peripheral Venous Disease, Osteoarthritis, Received Clustered Wound: No Chemotherapy, Received Radiation, Confinement Anxiety Photos Wound Measurements Length: (cm) 1.5 Width: (cm) 3 Depth: (cm) 0.3 Area: (cm) 3.534 Volume: (cm) 1.06 % Reduction in Area: -130.7% % Reduction in Volume: -592.8% Epithelialization: None Tunneling: No Undermining: No Wound Description Classification: Full Thickness Without Exposed Support Wound Margin: Distinct, outline attached Exudate Amount: Medium Exudate Type:  Serosanguineous Exudate Color: red, brown Structures Foul Odor After Cleansing: No Slough/Fibrino Yes Wound Bed Granulation Amount: None Present (0%) Exposed Structure Necrotic Amount: Large (67-100%) Fascia Exposed: No Necrotic Quality: Adherent Slough Fat Layer (Subcutaneous Tissue) Exposed: Yes Tendon Exposed: No Muscle Exposed: No Joint Exposed: No Bone Exposed: No Periwound Skin Texture Texture Color No Abnormalities Noted: Yes No Abnormalities Noted: No Atrophie Blanche: No Moisture Cyanosis: No No Abnormalities Noted: Yes Ecchymosis: No Erythema: No [name], [name] ([id number]) [id number] Page 5 of 6 Hemosiderin Staining: No Mottled: No Pallor: No Rubor: No Temperature / Pain Temperature: No Abnormality Treatment Notes Wound #1 (Breast (mastectomy site)) Wound Laterality: Right Cleanser Wound Cleanser Discharge Instruction: Cleanse the wound with wound cleanser prior to applying a clean dressing using gauze sponges, not tissue or cotton balls. [place] Ancillary Kit - 15 Day Supply Discharge Instruction: Use supplies as instructed; Kit contains: (15) Saline Bullets; (15) 3x3 Gauze; 15 pr Gloves Soap and Water Discharge Instruction: May shower and wash wound with dial antibacterial soap and water prior to dressing change. Peri-Wound Care Skin Prep Discharge Instruction: Use skin prep as directed Topical Primary Dressing Promogran Prisma Matrix, 4.34 (sq in) (silver collagen) Discharge Instruction: Moisten collagen with saline or hydrogel Secondary Dressing Woven Gauze Sponge, Non-Sterile 4x4 in Discharge Instruction: Apply over primary dressing as directed. Woven Gauze Sponges 2x2 in Discharge Instruction: Apply over primary dressing, fold in corners to fill  the space Zetuvit Plus Silicone Border Dressing 5x5 (in/in) Discharge Instruction: Apply silicone border over primary dressing as directed. Secured With Compression  Wrap Compression Stockings Facilities manager) Signed: 04/10/2023 11:18:15 AM By: [name] Previous Signature: 04/08/2023 9:39:58 AM Version By: [name] Entered By: [name] on 04/08/2023 09:49:47 -------------------------------------------------------------------------------- Vitals Details Patient Name: Date of Service: [name], [name] 04/08/2023 9:30 A M Medical Record Number: [id number] Patient Account Number: [id number] Date of Birth/Sex: Treating RN: December 14, 1950 (72 y.o. F) Primary Care Yang Rack: [name] Other Clinician: Referring Keeanna Villafranca: Treating Zackory Pudlo/Extender: [name], [name] in Treatment: 137 Vital Signs Time Taken: 09:20 Temperature (F): 98.1 Height (in): 63 Pulse (bpm): 70 Weight (lbs): 176 Respiratory Rate (breaths/min): 18 Body Mass Index (BMI): 31.2 Blood Pressure (mmHg): 149/74 Reference Range: 80 - 120 mg / dl [name], [name] ([id number]) [id number] Page 6 of 6 Electronic Signature(s) Signed: 04/08/2023 9:39:58 AM By: [name] Entered By: [name] on 04/08/2023 09:20:29

## 2023-04-08 NOTE — Progress Notes (Addendum)
healthier with more red granulation tissue and there is some slough and biofilm I am going to perform debridement to clear some of this wound today for continuing with collagen at this point she is doing a good job taking care of this. 02-04-2023 upon evaluation today patient appears to be doing well currently in regard to her wound. She has been tolerating the dressing changes without complication in general I really feel like they were making excellent progress here. Fortunately I do not see any signs of active infection locally or systemically which is great news. She is going require some debridement but overall I think that though slow this still continues to make some progress little by little. 03-11-2023 upon evaluation today patient's wound is doing roughly about the same. I do not see any signs of overall worsening which is great news and I am very pleased in that regard. With that being said I do believe that she does have a issue here with ongoing radiation damage to this region which is making it very hard to get the close but again we will continue to work as best we can on this. 04-08-2023 upon evaluation today patient appears to be doing well currently in regard to her wound. She has been tolerating the dressing changes without complication. Fortunately I do not see any signs of active infection locally or systemically at this time. Electronic Signature(s) Signed: 04/08/2023 10:20:26 AM By: Allen Derry PA-C Entered By: Allen Derry on 04/08/2023 07:20:26 -------------------------------------------------------------------------------- Physical Exam Details Patient Name: Date of Service: [name], [name]  04/08/2023 9:30 A M Medical Record Number: [id number] Patient Account Number: [id number] Date of Birth/Sex: Treating RN: March 14, 1951 (72 y.o. F) Primary Care Provider: Charlott Rakes Other Clinician: Referring Provider: Treating Provider/Extender: Roxanna Mew, [name]: 137 Constitutional [name] and well-hydrated in no acute distress. Respiratory normal breathing without difficulty. Psychiatric this patient is able to make decisions and demonstrates good insight into disease process. Alert and Oriented x 3. pleasant and cooperative. Notes Patient's wound bed actually did require sharp debridement clearway necrotic debris she tolerated that debridement today without complication and postdebridement wound bed appears to be doing significantly better which is great news. Very pleased based on what I am seeing. LORELEY, SCHWALL ([id number]) [id number] Page 5 of 11 Electronic Signature(s) Signed: 04/08/2023 10:20:44 AM By: Allen Derry PA-C Entered By: Allen Derry on 04/08/2023 07:20:43 -------------------------------------------------------------------------------- Physician Orders Details Patient Name: Date of Service: [name], [name] 04/08/2023 9:30 A M Medical Record Number: [id number] Patient Account Number: [id number] Date of Birth/Sex: Treating RN: [birth year] (72 y.o. F) Primary Care Provider: Charlott Rakes Other Clinician: Referring Provider: Treating Provider/Extender: Roxanna Mew, Christiana Fuchs in Treatment: [phone] Verbal / Phone Orders: No Diagnosis Coding ICD-10 Coding Code Description L98.492 Non-pressure chronic ulcer of skin of other sites with fat layer exposed L58.9 Radiodermatitis, unspecified L59.8 Other specified disorders of the skin and subcutaneous tissue related to radiation I10 Essential (primary) hypertension Follow-up Appointments Return appointment in 1 month. Junious Silk, PA on  Wednesday's Anesthetic (In clinic) Topical Lidocaine 5% applied to wound bed Cellular or Tissue Based Products daptic or Mepitel. (DO NOT REMOVE). - Cellular or Tissue Based Product applied to wound bed, secured with steri-strips, cover with A Epicord #1 06/11/22, #2 06/18/22, #3 06/25/22, #4 07/02/22, #5 07/09/22, #6 07/16/22, Epicord #7 07/23/22, Epicord #8 07/30/22, Epicord #9 08/06/2022. Epicord # 10 08/13/22 - no more after 10th application, per PA we will see how she  our office for additional recommendations. Electronic Signature(s) Signed: 05/06/2023 10:33:33 AM By: Pearletha Alfred Signed: 07/17/2023 12:01:32 PM By: Allen Derry PA-C Previous Signature: 04/08/2023 10:21:17 AM Version By: Allen Derry PA-C Entered By: Pearletha Alfred on 05/06/2023 07:33:33 -------------------------------------------------------------------------------- SuperBill Details Patient Name: Date of Service: [name], [name] 04/08/2023 Medical Record Number: [id number] Patient Account Number: [id number] Date of Birth/Sex: Treating RN: 24-Mar-1951 (72 y.o. F) Primary Care Provider: Charlott Rakes Other Clinician: Referring Provider: Treating Provider/Extender: Roxanna Mew, [name]: 137 Diagnosis Coding ICD-10 Codes Code Description [phone] Non-pressure chronic ulcer of skin of other sites with fat layer exposed L58.9 Radiodermatitis, unspecified L59.8 Other specified disorders of the skin and subcutaneous tissue related to radiation I10 Essential (primary) hypertension Facility Procedures : Darcus Austin, F The patient participates with Medicare or their insurance follows the Medicare Facility Guidelines:  CPT4 Code Description Modifier Quantity [id number] 11042 - DEB SUBQ TISSUE 20 SQ CM/< 1 ICD-10 Diagnosis Description L98.492 Non-pressure chronic ulcer of  skin of other sites with fat layer exposed AYE ([id number]) [id number] Page 11 of 11 Physician Procedures : CPT4 Code Description Modifier [id number] 11042 - WC PHYS SUBQ TISS 20 SQ CM ICD-10 Diagnosis Description L98.492 Non-pressure chronic ulcer of skin of other sites with fat layer exposed Quantity: 1 Electronic Signature(s) Signed: 04/08/2023 10:21:34 AM By: Allen Derry PA-C Entered By: Allen Derry on 04/08/2023 07:21:34  does after this with collagen for now. Additional Orders / Instructions Follow Nutritious Diet Wound Treatment Wound #1 - Breast (mastectomy site) Wound Laterality: Right Cleanser: Wound Cleanser (Generic) Every Other Day/30 Days Discharge Instructions: Cleanse the wound with wound cleanser prior to applying a clean dressing using gauze sponges, not tissue or cotton balls. Cleanser: Byram Ancillary Kit - 15 Day Supply (Generic) Every Other Day/30 Days Discharge Instructions: Use supplies as instructed; Kit contains: (15) Saline Bullets; (15) 3x3 Gauze; 15 pr Gloves Cleanser: Soap and Water Every Other Day/30 Days Discharge Instructions: May shower and wash wound with dial antibacterial soap and water prior to dressing change. Peri-Wound Care: Skin Prep (Generic) Every Other Day/30 Days Discharge Instructions: Use skin prep as directed Prim Dressing: Promogran Prisma Matrix, 4.34 (sq in) (silver collagen) (Generic) Every Other Day/30 Days ary Discharge Instructions: Moisten collagen with saline or hydrogel Secondary Dressing: Woven Gauze Sponge, Non-Sterile 4x4 in (Generic) Every Other Day/30 Days Discharge Instructions: Apply over primary dressing as directed. Secondary Dressing: Woven Gauze Sponges 2x2 in (Generic) Every Other Day/30 Days Discharge Instructions: Apply over primary dressing, fold in corners to fill the space Secondary Dressing: Zetuvit Plus Silicone Border Dressing 5x5 (in/in) (Generic) Every Other Day/30  Days Discharge Instructions: Apply silicone border over primary dressing as directed. Electronic Signature(s) Signed: 04/08/2023 4:15:09 PM By: Angelina Pih, Loise [phone] PM By: Dionne Ano [id number].pdf Page 6 of 11 Signed: 04/08/2023 Entered By: Allen Derry on 04/08/2023 07:08:01 -------------------------------------------------------------------------------- Problem List Details Patient Name: Date of Service: [name], [name] 04/08/2023 9:30 A M Medical Record Number: [id number] Patient Account Number: [id number] Date of Birth/Sex: Treating RN: 31-Jul-1951 (72 y.o. F) Primary Care Provider: Charlott Rakes Other Clinician: Referring Provider: Treating Provider/Extender: Roxanna Mew, Christiana Fuchs in Treatment: [phone] Active Problems ICD-10 Encounter Code Description Active Date MDM Diagnosis L98.492 Non-pressure chronic ulcer of skin of other sites with fat layer exposed 08/22/2020 No Yes L58.9 Radiodermatitis, unspecified 08/22/2020 No Yes L59.8 Other specified disorders of the skin and subcutaneous tissue related to 08/22/2020 No Yes radiation I10 Essential (primary) hypertension 08/22/2020 No Yes Inactive Problems Resolved Problems Electronic Signature(s) Signed: 04/08/2023 9:21:18 AM By: Allen Derry PA-C Entered By: Allen Derry on 04/08/2023 06:21:18 -------------------------------------------------------------------------------- Progress Note Details Patient Name: Date of Service: [name], [name] 04/08/2023 9:30 A M Medical Record Number: [id number] Patient Account Number: [id number] Date of Birth/Sex: Treating RN: 04-Jun-1951 (72 y.o. 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 She had a mastectomy initially in 1998. She had chemotherapy only no radiation following. In 2002 she subsequently did undergo radiation for the first time and then subsequently in 2012 had repeat radiation after having had a finding that was consistent with a relapse of the cancer. 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 She also has radiation damage to the skin over the right chest wall where she had the mastectomy. She was referred to [place] by Dr. Marcelle Overlie in [facility] and her oncologist is Dr. Arlan Organ. Subsequently I want to make sure before we delve into this more deeply that  our office for additional recommendations. Electronic Signature(s) Signed: 05/06/2023 10:33:33 AM By: Pearletha Alfred Signed: 07/17/2023 12:01:32 PM By: Allen Derry PA-C Previous Signature: 04/08/2023 10:21:17 AM Version By: Allen Derry PA-C Entered By: Pearletha Alfred on 05/06/2023 07:33:33 -------------------------------------------------------------------------------- SuperBill Details Patient Name: Date of Service: [name], [name] 04/08/2023 Medical Record Number: [id number] Patient Account Number: [id number] Date of Birth/Sex: Treating RN: 24-Mar-1951 (72 y.o. F) Primary Care Provider: Charlott Rakes Other Clinician: Referring Provider: Treating Provider/Extender: Roxanna Mew, [name]: 137 Diagnosis Coding ICD-10 Codes Code Description [phone] Non-pressure chronic ulcer of skin of other sites with fat layer exposed L58.9 Radiodermatitis, unspecified L59.8 Other specified disorders of the skin and subcutaneous tissue related to radiation I10 Essential (primary) hypertension Facility Procedures : Darcus Austin, F The patient participates with Medicare or their insurance follows the Medicare Facility Guidelines:  CPT4 Code Description Modifier Quantity [id number] 11042 - DEB SUBQ TISSUE 20 SQ CM/< 1 ICD-10 Diagnosis Description L98.492 Non-pressure chronic ulcer of  skin of other sites with fat layer exposed AYE ([id number]) [id number] Page 11 of 11 Physician Procedures : CPT4 Code Description Modifier [id number] 11042 - WC PHYS SUBQ TISS 20 SQ CM ICD-10 Diagnosis Description L98.492 Non-pressure chronic ulcer of skin of other sites with fat layer exposed Quantity: 1 Electronic Signature(s) Signed: 04/08/2023 10:21:34 AM By: Allen Derry PA-C Entered By: Allen Derry on 04/08/2023 07:21:34  does after this with collagen for now. Additional Orders / Instructions Follow Nutritious Diet Wound Treatment Wound #1 - Breast (mastectomy site) Wound Laterality: Right Cleanser: Wound Cleanser (Generic) Every Other Day/30 Days Discharge Instructions: Cleanse the wound with wound cleanser prior to applying a clean dressing using gauze sponges, not tissue or cotton balls. Cleanser: Byram Ancillary Kit - 15 Day Supply (Generic) Every Other Day/30 Days Discharge Instructions: Use supplies as instructed; Kit contains: (15) Saline Bullets; (15) 3x3 Gauze; 15 pr Gloves Cleanser: Soap and Water Every Other Day/30 Days Discharge Instructions: May shower and wash wound with dial antibacterial soap and water prior to dressing change. Peri-Wound Care: Skin Prep (Generic) Every Other Day/30 Days Discharge Instructions: Use skin prep as directed Prim Dressing: Promogran Prisma Matrix, 4.34 (sq in) (silver collagen) (Generic) Every Other Day/30 Days ary Discharge Instructions: Moisten collagen with saline or hydrogel Secondary Dressing: Woven Gauze Sponge, Non-Sterile 4x4 in (Generic) Every Other Day/30 Days Discharge Instructions: Apply over primary dressing as directed. Secondary Dressing: Woven Gauze Sponges 2x2 in (Generic) Every Other Day/30 Days Discharge Instructions: Apply over primary dressing, fold in corners to fill the space Secondary Dressing: Zetuvit Plus Silicone Border Dressing 5x5 (in/in) (Generic) Every Other Day/30  Days Discharge Instructions: Apply silicone border over primary dressing as directed. Electronic Signature(s) Signed: 04/08/2023 4:15:09 PM By: Angelina Pih, Loise [phone] PM By: Dionne Ano [id number].pdf Page 6 of 11 Signed: 04/08/2023 Entered By: Allen Derry on 04/08/2023 07:08:01 -------------------------------------------------------------------------------- Problem List Details Patient Name: Date of Service: [name], [name] 04/08/2023 9:30 A M Medical Record Number: [id number] Patient Account Number: [id number] Date of Birth/Sex: Treating RN: 31-Jul-1951 (72 y.o. F) Primary Care Provider: Charlott Rakes Other Clinician: Referring Provider: Treating Provider/Extender: Roxanna Mew, Christiana Fuchs in Treatment: [phone] Active Problems ICD-10 Encounter Code Description Active Date MDM Diagnosis L98.492 Non-pressure chronic ulcer of skin of other sites with fat layer exposed 08/22/2020 No Yes L58.9 Radiodermatitis, unspecified 08/22/2020 No Yes L59.8 Other specified disorders of the skin and subcutaneous tissue related to 08/22/2020 No Yes radiation I10 Essential (primary) hypertension 08/22/2020 No Yes Inactive Problems Resolved Problems Electronic Signature(s) Signed: 04/08/2023 9:21:18 AM By: Allen Derry PA-C Entered By: Allen Derry on 04/08/2023 06:21:18 -------------------------------------------------------------------------------- Progress Note Details Patient Name: Date of Service: [name], [name] 04/08/2023 9:30 A M Medical Record Number: [id number] Patient Account Number: [id number] Date of Birth/Sex: Treating RN: 04-Jun-1951 (72 y.o. 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 She had a mastectomy initially in 1998. She had chemotherapy only no radiation following. In 2002 she subsequently did undergo radiation for the first time and then subsequently in 2012 had repeat radiation after having had a finding that was consistent with a relapse of the cancer. 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 She also has radiation damage to the skin over the right chest wall where she had the mastectomy. She was referred to [place] by Dr. Marcelle Overlie in [facility] and her oncologist is Dr. Arlan Organ. Subsequently I want to make sure before we delve into this more deeply that  does after this with collagen for now. Additional Orders / Instructions Follow Nutritious Diet Wound Treatment Wound #1 - Breast (mastectomy site) Wound Laterality: Right Cleanser: Wound Cleanser (Generic) Every Other Day/30 Days Discharge Instructions: Cleanse the wound with wound cleanser prior to applying a clean dressing using gauze sponges, not tissue or cotton balls. Cleanser: Byram Ancillary Kit - 15 Day Supply (Generic) Every Other Day/30 Days Discharge Instructions: Use supplies as instructed; Kit contains: (15) Saline Bullets; (15) 3x3 Gauze; 15 pr Gloves Cleanser: Soap and Water Every Other Day/30 Days Discharge Instructions: May shower and wash wound with dial antibacterial soap and water prior to dressing change. Peri-Wound Care: Skin Prep (Generic) Every Other Day/30 Days Discharge Instructions: Use skin prep as directed Prim Dressing: Promogran Prisma Matrix, 4.34 (sq in) (silver collagen) (Generic) Every Other Day/30 Days ary Discharge Instructions: Moisten collagen with saline or hydrogel Secondary Dressing: Woven Gauze Sponge, Non-Sterile 4x4 in (Generic) Every Other Day/30 Days Discharge Instructions: Apply over primary dressing as directed. Secondary Dressing: Woven Gauze Sponges 2x2 in (Generic) Every Other Day/30 Days Discharge Instructions: Apply over primary dressing, fold in corners to fill the space Secondary Dressing: Zetuvit Plus Silicone Border Dressing 5x5 (in/in) (Generic) Every Other Day/30  Days Discharge Instructions: Apply silicone border over primary dressing as directed. Electronic Signature(s) Signed: 04/08/2023 4:15:09 PM By: Angelina Pih, Loise [phone] PM By: Dionne Ano [id number].pdf Page 6 of 11 Signed: 04/08/2023 Entered By: Allen Derry on 04/08/2023 07:08:01 -------------------------------------------------------------------------------- Problem List Details Patient Name: Date of Service: [name], [name] 04/08/2023 9:30 A M Medical Record Number: [id number] Patient Account Number: [id number] Date of Birth/Sex: Treating RN: 31-Jul-1951 (72 y.o. F) Primary Care Provider: Charlott Rakes Other Clinician: Referring Provider: Treating Provider/Extender: Roxanna Mew, Christiana Fuchs in Treatment: [phone] Active Problems ICD-10 Encounter Code Description Active Date MDM Diagnosis L98.492 Non-pressure chronic ulcer of skin of other sites with fat layer exposed 08/22/2020 No Yes L58.9 Radiodermatitis, unspecified 08/22/2020 No Yes L59.8 Other specified disorders of the skin and subcutaneous tissue related to 08/22/2020 No Yes radiation I10 Essential (primary) hypertension 08/22/2020 No Yes Inactive Problems Resolved Problems Electronic Signature(s) Signed: 04/08/2023 9:21:18 AM By: Allen Derry PA-C Entered By: Allen Derry on 04/08/2023 06:21:18 -------------------------------------------------------------------------------- Progress Note Details Patient Name: Date of Service: [name], [name] 04/08/2023 9:30 A M Medical Record Number: [id number] Patient Account Number: [id number] Date of Birth/Sex: Treating RN: 04-Jun-1951 (72 y.o. 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 She had a mastectomy initially in 1998. She had chemotherapy only no radiation following. In 2002 she subsequently did undergo radiation for the first time and then subsequently in 2012 had repeat radiation after having had a finding that was consistent with a relapse of the cancer. 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 She also has radiation damage to the skin over the right chest wall where she had the mastectomy. She was referred to [place] by Dr. Marcelle Overlie in [facility] and her oncologist is Dr. Arlan Organ. Subsequently I want to make sure before we delve into this more deeply that  healthier with more red granulation tissue and there is some slough and biofilm I am going to perform debridement to clear some of this wound today for continuing with collagen at this point she is doing a good job taking care of this. 02-04-2023 upon evaluation today patient appears to be doing well currently in regard to her wound. She has been tolerating the dressing changes without complication in general I really feel like they were making excellent progress here. Fortunately I do not see any signs of active infection locally or systemically which is great news. She is going require some debridement but overall I think that though slow this still continues to make some progress little by little. 03-11-2023 upon evaluation today patient's wound is doing roughly about the same. I do not see any signs of overall worsening which is great news and I am very pleased in that regard. With that being said I do believe that she does have a issue here with ongoing radiation damage to this region which is making it very hard to get the close but again we will continue to work as best we can on this. 04-08-2023 upon evaluation today patient appears to be doing well currently in regard to her wound. She has been tolerating the dressing changes without complication. Fortunately I do not see any signs of active infection locally or systemically at this time. Electronic Signature(s) Signed: 04/08/2023 10:20:26 AM By: Allen Derry PA-C Entered By: Allen Derry on 04/08/2023 07:20:26 -------------------------------------------------------------------------------- Physical Exam Details Patient Name: Date of Service: [name], [name]  04/08/2023 9:30 A M Medical Record Number: [id number] Patient Account Number: [id number] Date of Birth/Sex: Treating RN: March 14, 1951 (72 y.o. F) Primary Care Provider: Charlott Rakes Other Clinician: Referring Provider: Treating Provider/Extender: Roxanna Mew, [name]: 137 Constitutional [name] and well-hydrated in no acute distress. Respiratory normal breathing without difficulty. Psychiatric this patient is able to make decisions and demonstrates good insight into disease process. Alert and Oriented x 3. pleasant and cooperative. Notes Patient's wound bed actually did require sharp debridement clearway necrotic debris she tolerated that debridement today without complication and postdebridement wound bed appears to be doing significantly better which is great news. Very pleased based on what I am seeing. LORELEY, SCHWALL ([id number]) [id number] Page 5 of 11 Electronic Signature(s) Signed: 04/08/2023 10:20:44 AM By: Allen Derry PA-C Entered By: Allen Derry on 04/08/2023 07:20:43 -------------------------------------------------------------------------------- Physician Orders Details Patient Name: Date of Service: [name], [name] 04/08/2023 9:30 A M Medical Record Number: [id number] Patient Account Number: [id number] Date of Birth/Sex: Treating RN: [birth year] (72 y.o. F) Primary Care Provider: Charlott Rakes Other Clinician: Referring Provider: Treating Provider/Extender: Roxanna Mew, Christiana Fuchs in Treatment: [phone] Verbal / Phone Orders: No Diagnosis Coding ICD-10 Coding Code Description L98.492 Non-pressure chronic ulcer of skin of other sites with fat layer exposed L58.9 Radiodermatitis, unspecified L59.8 Other specified disorders of the skin and subcutaneous tissue related to radiation I10 Essential (primary) hypertension Follow-up Appointments Return appointment in 1 month. Junious Silk, PA on  Wednesday's Anesthetic (In clinic) Topical Lidocaine 5% applied to wound bed Cellular or Tissue Based Products daptic or Mepitel. (DO NOT REMOVE). - Cellular or Tissue Based Product applied to wound bed, secured with steri-strips, cover with A Epicord #1 06/11/22, #2 06/18/22, #3 06/25/22, #4 07/02/22, #5 07/09/22, #6 07/16/22, Epicord #7 07/23/22, Epicord #8 07/30/22, Epicord #9 08/06/2022. Epicord # 10 08/13/22 - no more after 10th application, per PA we will see how she  does after this with collagen for now. Additional Orders / Instructions Follow Nutritious Diet Wound Treatment Wound #1 - Breast (mastectomy site) Wound Laterality: Right Cleanser: Wound Cleanser (Generic) Every Other Day/30 Days Discharge Instructions: Cleanse the wound with wound cleanser prior to applying a clean dressing using gauze sponges, not tissue or cotton balls. Cleanser: Byram Ancillary Kit - 15 Day Supply (Generic) Every Other Day/30 Days Discharge Instructions: Use supplies as instructed; Kit contains: (15) Saline Bullets; (15) 3x3 Gauze; 15 pr Gloves Cleanser: Soap and Water Every Other Day/30 Days Discharge Instructions: May shower and wash wound with dial antibacterial soap and water prior to dressing change. Peri-Wound Care: Skin Prep (Generic) Every Other Day/30 Days Discharge Instructions: Use skin prep as directed Prim Dressing: Promogran Prisma Matrix, 4.34 (sq in) (silver collagen) (Generic) Every Other Day/30 Days ary Discharge Instructions: Moisten collagen with saline or hydrogel Secondary Dressing: Woven Gauze Sponge, Non-Sterile 4x4 in (Generic) Every Other Day/30 Days Discharge Instructions: Apply over primary dressing as directed. Secondary Dressing: Woven Gauze Sponges 2x2 in (Generic) Every Other Day/30 Days Discharge Instructions: Apply over primary dressing, fold in corners to fill the space Secondary Dressing: Zetuvit Plus Silicone Border Dressing 5x5 (in/in) (Generic) Every Other Day/30  Days Discharge Instructions: Apply silicone border over primary dressing as directed. Electronic Signature(s) Signed: 04/08/2023 4:15:09 PM By: Angelina Pih, Loise [phone] PM By: Dionne Ano [id number].pdf Page 6 of 11 Signed: 04/08/2023 Entered By: Allen Derry on 04/08/2023 07:08:01 -------------------------------------------------------------------------------- Problem List Details Patient Name: Date of Service: [name], [name] 04/08/2023 9:30 A M Medical Record Number: [id number] Patient Account Number: [id number] Date of Birth/Sex: Treating RN: 31-Jul-1951 (72 y.o. F) Primary Care Provider: Charlott Rakes Other Clinician: Referring Provider: Treating Provider/Extender: Roxanna Mew, Christiana Fuchs in Treatment: [phone] Active Problems ICD-10 Encounter Code Description Active Date MDM Diagnosis L98.492 Non-pressure chronic ulcer of skin of other sites with fat layer exposed 08/22/2020 No Yes L58.9 Radiodermatitis, unspecified 08/22/2020 No Yes L59.8 Other specified disorders of the skin and subcutaneous tissue related to 08/22/2020 No Yes radiation I10 Essential (primary) hypertension 08/22/2020 No Yes Inactive Problems Resolved Problems Electronic Signature(s) Signed: 04/08/2023 9:21:18 AM By: Allen Derry PA-C Entered By: Allen Derry on 04/08/2023 06:21:18 -------------------------------------------------------------------------------- Progress Note Details Patient Name: Date of Service: [name], [name] 04/08/2023 9:30 A M Medical Record Number: [id number] Patient Account Number: [id number] Date of Birth/Sex: Treating RN: 04-Jun-1951 (72 y.o. 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 She had a mastectomy initially in 1998. She had chemotherapy only no radiation following. In 2002 she subsequently did undergo radiation for the first time and then subsequently in 2012 had repeat radiation after having had a finding that was consistent with a relapse of the cancer. 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 She also has radiation damage to the skin over the right chest wall where she had the mastectomy. She was referred to [place] by Dr. Marcelle Overlie in [facility] and her oncologist is Dr. Arlan Organ. Subsequently I want to make sure before we delve into this more deeply that  our office for additional recommendations. Electronic Signature(s) Signed: 05/06/2023 10:33:33 AM By: Pearletha Alfred Signed: 07/17/2023 12:01:32 PM By: Allen Derry PA-C Previous Signature: 04/08/2023 10:21:17 AM Version By: Allen Derry PA-C Entered By: Pearletha Alfred on 05/06/2023 07:33:33 -------------------------------------------------------------------------------- SuperBill Details Patient Name: Date of Service: [name], [name] 04/08/2023 Medical Record Number: [id number] Patient Account Number: [id number] Date of Birth/Sex: Treating RN: 24-Mar-1951 (72 y.o. F) Primary Care Provider: Charlott Rakes Other Clinician: Referring Provider: Treating Provider/Extender: Roxanna Mew, [name]: 137 Diagnosis Coding ICD-10 Codes Code Description [phone] Non-pressure chronic ulcer of skin of other sites with fat layer exposed L58.9 Radiodermatitis, unspecified L59.8 Other specified disorders of the skin and subcutaneous tissue related to radiation I10 Essential (primary) hypertension Facility Procedures : Darcus Austin, F The patient participates with Medicare or their insurance follows the Medicare Facility Guidelines:  CPT4 Code Description Modifier Quantity [id number] 11042 - DEB SUBQ TISSUE 20 SQ CM/< 1 ICD-10 Diagnosis Description L98.492 Non-pressure chronic ulcer of  skin of other sites with fat layer exposed AYE ([id number]) [id number] Page 11 of 11 Physician Procedures : CPT4 Code Description Modifier [id number] 11042 - WC PHYS SUBQ TISS 20 SQ CM ICD-10 Diagnosis Description L98.492 Non-pressure chronic ulcer of skin of other sites with fat layer exposed Quantity: 1 Electronic Signature(s) Signed: 04/08/2023 10:21:34 AM By: Allen Derry PA-C Entered By: Allen Derry on 04/08/2023 07:21:34  healthier with more red granulation tissue and there is some slough and biofilm I am going to perform debridement to clear some of this wound today for continuing with collagen at this point she is doing a good job taking care of this. 02-04-2023 upon evaluation today patient appears to be doing well currently in regard to her wound. She has been tolerating the dressing changes without complication in general I really feel like they were making excellent progress here. Fortunately I do not see any signs of active infection locally or systemically which is great news. She is going require some debridement but overall I think that though slow this still continues to make some progress little by little. 03-11-2023 upon evaluation today patient's wound is doing roughly about the same. I do not see any signs of overall worsening which is great news and I am very pleased in that regard. With that being said I do believe that she does have a issue here with ongoing radiation damage to this region which is making it very hard to get the close but again we will continue to work as best we can on this. 04-08-2023 upon evaluation today patient appears to be doing well currently in regard to her wound. She has been tolerating the dressing changes without complication. Fortunately I do not see any signs of active infection locally or systemically at this time. Electronic Signature(s) Signed: 04/08/2023 10:20:26 AM By: Allen Derry PA-C Entered By: Allen Derry on 04/08/2023 07:20:26 -------------------------------------------------------------------------------- Physical Exam Details Patient Name: Date of Service: [name], [name]  04/08/2023 9:30 A M Medical Record Number: [id number] Patient Account Number: [id number] Date of Birth/Sex: Treating RN: March 14, 1951 (72 y.o. F) Primary Care Provider: Charlott Rakes Other Clinician: Referring Provider: Treating Provider/Extender: Roxanna Mew, [name]: 137 Constitutional [name] and well-hydrated in no acute distress. Respiratory normal breathing without difficulty. Psychiatric this patient is able to make decisions and demonstrates good insight into disease process. Alert and Oriented x 3. pleasant and cooperative. Notes Patient's wound bed actually did require sharp debridement clearway necrotic debris she tolerated that debridement today without complication and postdebridement wound bed appears to be doing significantly better which is great news. Very pleased based on what I am seeing. LORELEY, SCHWALL ([id number]) [id number] Page 5 of 11 Electronic Signature(s) Signed: 04/08/2023 10:20:44 AM By: Allen Derry PA-C Entered By: Allen Derry on 04/08/2023 07:20:43 -------------------------------------------------------------------------------- Physician Orders Details Patient Name: Date of Service: [name], [name] 04/08/2023 9:30 A M Medical Record Number: [id number] Patient Account Number: [id number] Date of Birth/Sex: Treating RN: [birth year] (72 y.o. F) Primary Care Provider: Charlott Rakes Other Clinician: Referring Provider: Treating Provider/Extender: Roxanna Mew, Christiana Fuchs in Treatment: [phone] Verbal / Phone Orders: No Diagnosis Coding ICD-10 Coding Code Description L98.492 Non-pressure chronic ulcer of skin of other sites with fat layer exposed L58.9 Radiodermatitis, unspecified L59.8 Other specified disorders of the skin and subcutaneous tissue related to radiation I10 Essential (primary) hypertension Follow-up Appointments Return appointment in 1 month. Junious Silk, PA on  Wednesday's Anesthetic (In clinic) Topical Lidocaine 5% applied to wound bed Cellular or Tissue Based Products daptic or Mepitel. (DO NOT REMOVE). - Cellular or Tissue Based Product applied to wound bed, secured with steri-strips, cover with A Epicord #1 06/11/22, #2 06/18/22, #3 06/25/22, #4 07/02/22, #5 07/09/22, #6 07/16/22, Epicord #7 07/23/22, Epicord #8 07/30/22, Epicord #9 08/06/2022. Epicord # 10 08/13/22 - no more after 10th application, per PA we will see how she  KAYRON, KALMAR ([id number]) [id number] Page 1 of 11 Visit Report for 04/08/2023 Chief Complaint Document Details Patient Name: Date of Service: [name] 04/08/2023 9:30 A M Medical Record Number: [id number] Patient Account Number: [id number] Date of Birth/Sex: Treating RN: Jun 28, 1951 (72 y.o. F) Primary Care Provider: Charlott Rakes Other Clinician: Referring Provider: Treating Provider/Extender: Roxanna Mew, [name]: [phone] Information Obtained from: Patient Chief Complaint Right Breast soft tissue radionecrosis following mastectomy Electronic Signature(s) Signed: 04/08/2023 9:21:24 AM By: Allen Derry PA-C Entered By: Allen Derry on 04/08/2023 78:46:96 -------------------------------------------------------------------------------- Debridement Details Patient Name: Date of Service: [name], [name] 04/08/2023 9:30 A M Medical Record Number: [id number] Patient Account Number: [id number] Date of Birth/Sex: Treating RN: 1950-11-19 (72 y.o. F) Primary Care Provider: Charlott Rakes Other Clinician: Referring Provider: Treating Provider/Extender: Roxanna Mew, [name]: 137 Debridement Performed for Assessment: Wound #1 Right Breast (mastectomy site) Performed By: Physician Lenda Kelp, PA Debridement Type: Debridement Level of Consciousness (Pre-procedure): Awake and Alert Pre-procedure Verification/Time Out Yes - 10:00 Taken: Start Time: 10:00 Pain Control: Lidocaine 4% T opical Solution Percent of Wound Bed Debrided: 100% T Area Debrided (cm): otal 3.53 Tissue and other material debrided: Viable, Non-Viable, Slough, Subcutaneous, Biofilm, Slough Level: Skin/Subcutaneous Tissue Debridement Description: Excisional Instrument: Curette Bleeding: Minimum Hemostasis Achieved: Pressure End Time: 10:02 Response to Treatment: Procedure was tolerated well Level of Consciousness (Post- Awake  and Alert procedure): Post Debridement Measurements of Total Wound Length: (cm) 1.5 Width: (cm) 3 Depth: (cm) 0.03 Volume: (cm) 0.106 Character of Wound/Ulcer Post Debridement: Improved Post Procedure Diagnosis Same as Franki Cabot ([id number]) [phone].pdf Page 2 of 11 Electronic Signature(s) Signed: 04/08/2023 4:15:09 PM By: Allen Derry PA-C Entered By: Allen Derry on 04/08/2023 07:07:21 -------------------------------------------------------------------------------- HPI Details Patient Name: Date of Service: [name], [name] 04/08/2023 9:30 A M Medical Record Number: [id number] Patient Account Number: [id number] Date of Birth/Sex: Treating RN: [birth year] (72 y.o. F) Primary Care Provider: Charlott Rakes Other Clinician: Referring Provider: Treating Provider/Extender: Roxanna Mew, Christiana Fuchs in Treatment: 137 History of Present Illness HPI Description: 08/22/2020 upon evaluation today patient actually appears to be doing somewhat poorly in regard to her mastectomy site on the right chest wall. She had a mastectomy initially in 1998. She had chemotherapy only no radiation following. In 2002 she subsequently did undergo radiation for the first time and then subsequently in 2012 had repeat radiation after having had a finding that was consistent with a relapse of the cancer. 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 She also has radiation damage to the skin over the right chest wall where she had the mastectomy. She was referred to [place] by Dr. Marcelle Overlie in [facility] and her oncologist is Dr. Arlan Organ. Subsequently I want to make sure before we delve into this more deeply that the patient does not have any issues with a return of cancer that needs to be managed by them. Obviously she does have significant scar tissue which may be benefited secondary to the soft tissue  radionecrosis by hyperbaric oxygen therapy in the absence of any recurrence of the cancer. Nonetheless she does not remember exactly when her last PET scan was but it was not too recently she tells me. 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 The patient does have hypertension as well. She sees her oncologist next on December 6. 09/12/2020 on evaluation  our office for additional recommendations. Electronic Signature(s) Signed: 05/06/2023 10:33:33 AM By: Pearletha Alfred Signed: 07/17/2023 12:01:32 PM By: Allen Derry PA-C Previous Signature: 04/08/2023 10:21:17 AM Version By: Allen Derry PA-C Entered By: Pearletha Alfred on 05/06/2023 07:33:33 -------------------------------------------------------------------------------- SuperBill Details Patient Name: Date of Service: [name], [name] 04/08/2023 Medical Record Number: [id number] Patient Account Number: [id number] Date of Birth/Sex: Treating RN: 24-Mar-1951 (72 y.o. F) Primary Care Provider: Charlott Rakes Other Clinician: Referring Provider: Treating Provider/Extender: Roxanna Mew, [name]: 137 Diagnosis Coding ICD-10 Codes Code Description [phone] Non-pressure chronic ulcer of skin of other sites with fat layer exposed L58.9 Radiodermatitis, unspecified L59.8 Other specified disorders of the skin and subcutaneous tissue related to radiation I10 Essential (primary) hypertension Facility Procedures : Darcus Austin, F The patient participates with Medicare or their insurance follows the Medicare Facility Guidelines:  CPT4 Code Description Modifier Quantity [id number] 11042 - DEB SUBQ TISSUE 20 SQ CM/< 1 ICD-10 Diagnosis Description L98.492 Non-pressure chronic ulcer of  skin of other sites with fat layer exposed AYE ([id number]) [id number] Page 11 of 11 Physician Procedures : CPT4 Code Description Modifier [id number] 11042 - WC PHYS SUBQ TISS 20 SQ CM ICD-10 Diagnosis Description L98.492 Non-pressure chronic ulcer of skin of other sites with fat layer exposed Quantity: 1 Electronic Signature(s) Signed: 04/08/2023 10:21:34 AM By: Allen Derry PA-C Entered By: Allen Derry on 04/08/2023 07:21:34  does after this with collagen for now. Additional Orders / Instructions Follow Nutritious Diet Wound Treatment Wound #1 - Breast (mastectomy site) Wound Laterality: Right Cleanser: Wound Cleanser (Generic) Every Other Day/30 Days Discharge Instructions: Cleanse the wound with wound cleanser prior to applying a clean dressing using gauze sponges, not tissue or cotton balls. Cleanser: Byram Ancillary Kit - 15 Day Supply (Generic) Every Other Day/30 Days Discharge Instructions: Use supplies as instructed; Kit contains: (15) Saline Bullets; (15) 3x3 Gauze; 15 pr Gloves Cleanser: Soap and Water Every Other Day/30 Days Discharge Instructions: May shower and wash wound with dial antibacterial soap and water prior to dressing change. Peri-Wound Care: Skin Prep (Generic) Every Other Day/30 Days Discharge Instructions: Use skin prep as directed Prim Dressing: Promogran Prisma Matrix, 4.34 (sq in) (silver collagen) (Generic) Every Other Day/30 Days ary Discharge Instructions: Moisten collagen with saline or hydrogel Secondary Dressing: Woven Gauze Sponge, Non-Sterile 4x4 in (Generic) Every Other Day/30 Days Discharge Instructions: Apply over primary dressing as directed. Secondary Dressing: Woven Gauze Sponges 2x2 in (Generic) Every Other Day/30 Days Discharge Instructions: Apply over primary dressing, fold in corners to fill the space Secondary Dressing: Zetuvit Plus Silicone Border Dressing 5x5 (in/in) (Generic) Every Other Day/30  Days Discharge Instructions: Apply silicone border over primary dressing as directed. Electronic Signature(s) Signed: 04/08/2023 4:15:09 PM By: Angelina Pih, Loise [phone] PM By: Dionne Ano [id number].pdf Page 6 of 11 Signed: 04/08/2023 Entered By: Allen Derry on 04/08/2023 07:08:01 -------------------------------------------------------------------------------- Problem List Details Patient Name: Date of Service: [name], [name] 04/08/2023 9:30 A M Medical Record Number: [id number] Patient Account Number: [id number] Date of Birth/Sex: Treating RN: 31-Jul-1951 (72 y.o. F) Primary Care Provider: Charlott Rakes Other Clinician: Referring Provider: Treating Provider/Extender: Roxanna Mew, Christiana Fuchs in Treatment: [phone] Active Problems ICD-10 Encounter Code Description Active Date MDM Diagnosis L98.492 Non-pressure chronic ulcer of skin of other sites with fat layer exposed 08/22/2020 No Yes L58.9 Radiodermatitis, unspecified 08/22/2020 No Yes L59.8 Other specified disorders of the skin and subcutaneous tissue related to 08/22/2020 No Yes radiation I10 Essential (primary) hypertension 08/22/2020 No Yes Inactive Problems Resolved Problems Electronic Signature(s) Signed: 04/08/2023 9:21:18 AM By: Allen Derry PA-C Entered By: Allen Derry on 04/08/2023 06:21:18 -------------------------------------------------------------------------------- Progress Note Details Patient Name: Date of Service: [name], [name] 04/08/2023 9:30 A M Medical Record Number: [id number] Patient Account Number: [id number] Date of Birth/Sex: Treating RN: 04-Jun-1951 (72 y.o. 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 She had a mastectomy initially in 1998. She had chemotherapy only no radiation following. In 2002 she subsequently did undergo radiation for the first time and then subsequently in 2012 had repeat radiation after having had a finding that was consistent with a relapse of the cancer. 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 She also has radiation damage to the skin over the right chest wall where she had the mastectomy. She was referred to [place] by Dr. Marcelle Overlie in [facility] and her oncologist is Dr. Arlan Organ. Subsequently I want to make sure before we delve into this more deeply that  does after this with collagen for now. Additional Orders / Instructions Follow Nutritious Diet Wound Treatment Wound #1 - Breast (mastectomy site) Wound Laterality: Right Cleanser: Wound Cleanser (Generic) Every Other Day/30 Days Discharge Instructions: Cleanse the wound with wound cleanser prior to applying a clean dressing using gauze sponges, not tissue or cotton balls. Cleanser: Byram Ancillary Kit - 15 Day Supply (Generic) Every Other Day/30 Days Discharge Instructions: Use supplies as instructed; Kit contains: (15) Saline Bullets; (15) 3x3 Gauze; 15 pr Gloves Cleanser: Soap and Water Every Other Day/30 Days Discharge Instructions: May shower and wash wound with dial antibacterial soap and water prior to dressing change. Peri-Wound Care: Skin Prep (Generic) Every Other Day/30 Days Discharge Instructions: Use skin prep as directed Prim Dressing: Promogran Prisma Matrix, 4.34 (sq in) (silver collagen) (Generic) Every Other Day/30 Days ary Discharge Instructions: Moisten collagen with saline or hydrogel Secondary Dressing: Woven Gauze Sponge, Non-Sterile 4x4 in (Generic) Every Other Day/30 Days Discharge Instructions: Apply over primary dressing as directed. Secondary Dressing: Woven Gauze Sponges 2x2 in (Generic) Every Other Day/30 Days Discharge Instructions: Apply over primary dressing, fold in corners to fill the space Secondary Dressing: Zetuvit Plus Silicone Border Dressing 5x5 (in/in) (Generic) Every Other Day/30  Days Discharge Instructions: Apply silicone border over primary dressing as directed. Electronic Signature(s) Signed: 04/08/2023 4:15:09 PM By: Angelina Pih, Loise [phone] PM By: Dionne Ano [id number].pdf Page 6 of 11 Signed: 04/08/2023 Entered By: Allen Derry on 04/08/2023 07:08:01 -------------------------------------------------------------------------------- Problem List Details Patient Name: Date of Service: [name], [name] 04/08/2023 9:30 A M Medical Record Number: [id number] Patient Account Number: [id number] Date of Birth/Sex: Treating RN: 31-Jul-1951 (72 y.o. F) Primary Care Provider: Charlott Rakes Other Clinician: Referring Provider: Treating Provider/Extender: Roxanna Mew, Christiana Fuchs in Treatment: [phone] Active Problems ICD-10 Encounter Code Description Active Date MDM Diagnosis L98.492 Non-pressure chronic ulcer of skin of other sites with fat layer exposed 08/22/2020 No Yes L58.9 Radiodermatitis, unspecified 08/22/2020 No Yes L59.8 Other specified disorders of the skin and subcutaneous tissue related to 08/22/2020 No Yes radiation I10 Essential (primary) hypertension 08/22/2020 No Yes Inactive Problems Resolved Problems Electronic Signature(s) Signed: 04/08/2023 9:21:18 AM By: Allen Derry PA-C Entered By: Allen Derry on 04/08/2023 06:21:18 -------------------------------------------------------------------------------- Progress Note Details Patient Name: Date of Service: [name], [name] 04/08/2023 9:30 A M Medical Record Number: [id number] Patient Account Number: [id number] Date of Birth/Sex: Treating RN: 04-Jun-1951 (72 y.o. 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 She had a mastectomy initially in 1998. She had chemotherapy only no radiation following. In 2002 she subsequently did undergo radiation for the first time and then subsequently in 2012 had repeat radiation after having had a finding that was consistent with a relapse of the cancer. 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 She also has radiation damage to the skin over the right chest wall where she had the mastectomy. She was referred to [place] by Dr. Marcelle Overlie in [facility] and her oncologist is Dr. Arlan Organ. Subsequently I want to make sure before we delve into this more deeply that  our office for additional recommendations. Electronic Signature(s) Signed: 05/06/2023 10:33:33 AM By: Pearletha Alfred Signed: 07/17/2023 12:01:32 PM By: Allen Derry PA-C Previous Signature: 04/08/2023 10:21:17 AM Version By: Allen Derry PA-C Entered By: Pearletha Alfred on 05/06/2023 07:33:33 -------------------------------------------------------------------------------- SuperBill Details Patient Name: Date of Service: [name], [name] 04/08/2023 Medical Record Number: [id number] Patient Account Number: [id number] Date of Birth/Sex: Treating RN: 24-Mar-1951 (72 y.o. F) Primary Care Provider: Charlott Rakes Other Clinician: Referring Provider: Treating Provider/Extender: Roxanna Mew, [name]: 137 Diagnosis Coding ICD-10 Codes Code Description [phone] Non-pressure chronic ulcer of skin of other sites with fat layer exposed L58.9 Radiodermatitis, unspecified L59.8 Other specified disorders of the skin and subcutaneous tissue related to radiation I10 Essential (primary) hypertension Facility Procedures : Darcus Austin, F The patient participates with Medicare or their insurance follows the Medicare Facility Guidelines:  CPT4 Code Description Modifier Quantity [id number] 11042 - DEB SUBQ TISSUE 20 SQ CM/< 1 ICD-10 Diagnosis Description L98.492 Non-pressure chronic ulcer of  skin of other sites with fat layer exposed AYE ([id number]) [id number] Page 11 of 11 Physician Procedures : CPT4 Code Description Modifier [id number] 11042 - WC PHYS SUBQ TISS 20 SQ CM ICD-10 Diagnosis Description L98.492 Non-pressure chronic ulcer of skin of other sites with fat layer exposed Quantity: 1 Electronic Signature(s) Signed: 04/08/2023 10:21:34 AM By: Allen Derry PA-C Entered By: Allen Derry on 04/08/2023 07:21:34  does after this with collagen for now. Additional Orders / Instructions Follow Nutritious Diet Wound Treatment Wound #1 - Breast (mastectomy site) Wound Laterality: Right Cleanser: Wound Cleanser (Generic) Every Other Day/30 Days Discharge Instructions: Cleanse the wound with wound cleanser prior to applying a clean dressing using gauze sponges, not tissue or cotton balls. Cleanser: Byram Ancillary Kit - 15 Day Supply (Generic) Every Other Day/30 Days Discharge Instructions: Use supplies as instructed; Kit contains: (15) Saline Bullets; (15) 3x3 Gauze; 15 pr Gloves Cleanser: Soap and Water Every Other Day/30 Days Discharge Instructions: May shower and wash wound with dial antibacterial soap and water prior to dressing change. Peri-Wound Care: Skin Prep (Generic) Every Other Day/30 Days Discharge Instructions: Use skin prep as directed Prim Dressing: Promogran Prisma Matrix, 4.34 (sq in) (silver collagen) (Generic) Every Other Day/30 Days ary Discharge Instructions: Moisten collagen with saline or hydrogel Secondary Dressing: Woven Gauze Sponge, Non-Sterile 4x4 in (Generic) Every Other Day/30 Days Discharge Instructions: Apply over primary dressing as directed. Secondary Dressing: Woven Gauze Sponges 2x2 in (Generic) Every Other Day/30 Days Discharge Instructions: Apply over primary dressing, fold in corners to fill the space Secondary Dressing: Zetuvit Plus Silicone Border Dressing 5x5 (in/in) (Generic) Every Other Day/30  Days Discharge Instructions: Apply silicone border over primary dressing as directed. Electronic Signature(s) Signed: 04/08/2023 4:15:09 PM By: Angelina Pih, Loise [phone] PM By: Dionne Ano [id number].pdf Page 6 of 11 Signed: 04/08/2023 Entered By: Allen Derry on 04/08/2023 07:08:01 -------------------------------------------------------------------------------- Problem List Details Patient Name: Date of Service: [name], [name] 04/08/2023 9:30 A M Medical Record Number: [id number] Patient Account Number: [id number] Date of Birth/Sex: Treating RN: 31-Jul-1951 (72 y.o. F) Primary Care Provider: Charlott Rakes Other Clinician: Referring Provider: Treating Provider/Extender: Roxanna Mew, Christiana Fuchs in Treatment: [phone] Active Problems ICD-10 Encounter Code Description Active Date MDM Diagnosis L98.492 Non-pressure chronic ulcer of skin of other sites with fat layer exposed 08/22/2020 No Yes L58.9 Radiodermatitis, unspecified 08/22/2020 No Yes L59.8 Other specified disorders of the skin and subcutaneous tissue related to 08/22/2020 No Yes radiation I10 Essential (primary) hypertension 08/22/2020 No Yes Inactive Problems Resolved Problems Electronic Signature(s) Signed: 04/08/2023 9:21:18 AM By: Allen Derry PA-C Entered By: Allen Derry on 04/08/2023 06:21:18 -------------------------------------------------------------------------------- Progress Note Details Patient Name: Date of Service: [name], [name] 04/08/2023 9:30 A M Medical Record Number: [id number] Patient Account Number: [id number] Date of Birth/Sex: Treating RN: 04-Jun-1951 (72 y.o. 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 She had a mastectomy initially in 1998. She had chemotherapy only no radiation following. In 2002 she subsequently did undergo radiation for the first time and then subsequently in 2012 had repeat radiation after having had a finding that was consistent with a relapse of the cancer. 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 She also has radiation damage to the skin over the right chest wall where she had the mastectomy. She was referred to [place] by Dr. Marcelle Overlie in [facility] and her oncologist is Dr. Arlan Organ. Subsequently I want to make sure before we delve into this more deeply that

## 2023-04-20 ENCOUNTER — Inpatient Hospital Stay: Payer: Medicare Other | Attending: Hematology & Oncology

## 2023-04-20 ENCOUNTER — Inpatient Hospital Stay: Payer: Medicare Other

## 2023-04-20 VITALS — BP 163/52 | HR 72 | Temp 97.9°F | Resp 19

## 2023-04-20 DIAGNOSIS — N189 Chronic kidney disease, unspecified: Secondary | ICD-10-CM | POA: Diagnosis not present

## 2023-04-20 DIAGNOSIS — D508 Other iron deficiency anemias: Secondary | ICD-10-CM

## 2023-04-20 DIAGNOSIS — D631 Anemia in chronic kidney disease: Secondary | ICD-10-CM

## 2023-04-20 DIAGNOSIS — C50011 Malignant neoplasm of nipple and areola, right female breast: Secondary | ICD-10-CM

## 2023-04-20 DIAGNOSIS — C50911 Malignant neoplasm of unspecified site of right female breast: Secondary | ICD-10-CM | POA: Insufficient documentation

## 2023-04-20 LAB — CMP (CANCER CENTER ONLY)
ALT: 13 U/L (ref 0–44)
AST: 14 U/L — ABNORMAL LOW (ref 15–41)
Albumin: 4.2 g/dL (ref 3.5–5.0)
Alkaline Phosphatase: 54 U/L (ref 38–126)
Anion gap: 8 (ref 5–15)
BUN: 19 mg/dL (ref 8–23)
CO2: 29 mmol/L (ref 22–32)
Calcium: 9.4 mg/dL (ref 8.9–10.3)
Chloride: 108 mmol/L (ref 98–111)
Creatinine: 0.71 mg/dL (ref 0.44–1.00)
GFR, Estimated: >60 mL/min (ref >60)
Glucose, Bld: 112 mg/dL — ABNORMAL HIGH (ref 70–99)
Potassium: 3.8 mmol/L (ref 3.5–5.1)
Sodium: 145 mmol/L (ref 135–145)
Total Bilirubin: 0.5 mg/dL (ref 0.3–1.2)
Total Protein: 7.5 g/dL (ref 6.5–8.1)

## 2023-04-20 LAB — CBC WITH DIFFERENTIAL (CANCER CENTER ONLY)
Abs Immature Granulocytes: 0.02 10*3/uL (ref 0.00–0.07)
Basophils Absolute: 0 10*3/uL (ref 0.0–0.1)
Basophils Relative: 1 %
Eosinophils Absolute: 0.3 10*3/uL (ref 0.0–0.5)
Eosinophils Relative: 5 %
HCT: 33.1 % — ABNORMAL LOW (ref 36.0–46.0)
Hemoglobin: 10.3 g/dL — ABNORMAL LOW (ref 12.0–15.0)
Immature Granulocytes: 0 %
Lymphocytes Relative: 33 %
Lymphs Abs: 2 10*3/uL (ref 0.7–4.0)
MCH: 29.1 pg (ref 26.0–34.0)
MCHC: 31.1 g/dL (ref 30.0–36.0)
MCV: 93.5 fL (ref 80.0–100.0)
Monocytes Absolute: 0.5 10*3/uL (ref 0.1–1.0)
Monocytes Relative: 9 %
Neutro Abs: 3.2 10*3/uL (ref 1.7–7.7)
Neutrophils Relative %: 52 %
Platelet Count: 178 10*3/uL (ref 150–400)
RBC: 3.54 MIL/uL — ABNORMAL LOW (ref 3.87–5.11)
RDW: 14.4 % (ref 11.5–15.5)
WBC Count: 6.1 10*3/uL (ref 4.0–10.5)
nRBC: 0 % (ref 0.0–0.2)

## 2023-04-20 MED ORDER — DARBEPOETIN ALFA 300 MCG/0.6ML IJ SOSY
300.0000 ug | PREFILLED_SYRINGE | Freq: Once | INTRAMUSCULAR | Status: AC
  Administered 2023-04-20: 300 ug via SUBCUTANEOUS
  Filled 2023-04-20: qty 0.6

## 2023-04-20 MED ORDER — SODIUM CHLORIDE 0.9% FLUSH: 3.0000 mL | Freq: Once | INTRAVENOUS | Status: DC | PRN

## 2023-04-20 MED ORDER — SODIUM CHLORIDE 0.9% FLUSH: 10.0000 mL | INTRAVENOUS | Status: DC | PRN

## 2023-04-20 NOTE — Patient Instructions (Signed)

## 2023-04-29 DIAGNOSIS — R7303 Prediabetes: Secondary | ICD-10-CM | POA: Diagnosis not present

## 2023-04-29 DIAGNOSIS — E782 Mixed hyperlipidemia: Secondary | ICD-10-CM | POA: Diagnosis not present

## 2023-04-30 DIAGNOSIS — L602 Onychogryphosis: Secondary | ICD-10-CM | POA: Diagnosis not present

## 2023-05-06 ENCOUNTER — Encounter (HOSPITAL_BASED_OUTPATIENT_CLINIC_OR_DEPARTMENT_OTHER): Payer: Medicare Other | Attending: Physician Assistant | Admitting: Internal Medicine

## 2023-05-06 DIAGNOSIS — I10 Essential (primary) hypertension: Secondary | ICD-10-CM | POA: Diagnosis not present

## 2023-05-06 DIAGNOSIS — L98492 Non-pressure chronic ulcer of skin of other sites with fat layer exposed: Secondary | ICD-10-CM | POA: Diagnosis not present

## 2023-05-06 DIAGNOSIS — I890 Lymphedema, not elsewhere classified: Secondary | ICD-10-CM | POA: Diagnosis not present

## 2023-05-06 DIAGNOSIS — Z86718 Personal history of other venous thrombosis and embolism: Secondary | ICD-10-CM | POA: Diagnosis not present

## 2023-05-06 DIAGNOSIS — L598 Other specified disorders of the skin and subcutaneous tissue related to radiation: Secondary | ICD-10-CM | POA: Insufficient documentation

## 2023-05-06 DIAGNOSIS — M1990 Unspecified osteoarthritis, unspecified site: Secondary | ICD-10-CM | POA: Diagnosis not present

## 2023-05-07 NOTE — Progress Notes (Signed)
[name], [name] ([id number]) [id number] Page 1 of 7 Visit Report for 05/06/2023 Arrival Information Details Patient Name: Date of Service: [name], [place] [name] 05/06/2023 9:30 A M Medical Record Number: [id number] Patient Account Number: [id number] Date of Birth/Sex: Treating RN: 1950/11/03 (72 y.o. F) Primary Care [name]: [name] Other Clinician: Referring [name]: Treating [name]/Extender: [name], [name] in Treatment: 141 Visit Information History Since Last Visit All ordered tests and consults were completed: No Patient Arrived: Ambulatory Added or deleted any medications: No Arrival Time: 09:17 Any new allergies or adverse reactions: No Accompanied By: friend Had a fall or experienced change in No Transfer Assistance: None activities of daily living that may affect Patient Identification Verified: Yes risk of falls: Secondary Verification Process Completed: Yes Signs or symptoms of abuse/neglect since last visito No Patient Requires Transmission-Based Precautions: No Hospitalized since last visit: No Patient Has Alerts: No Implantable device outside of the clinic excluding No cellular tissue based products placed in the center since last visit: Pain Present Now: No Electronic Signature(s) Signed: 05/06/2023 9:38:44 AM By: [name] Entered By: [name] on 05/06/2023 09:17:50 -------------------------------------------------------------------------------- Encounter Discharge Information Details Patient Name: Date of Service: [name], [name] 05/06/2023 9:30 A M Medical Record Number: [id number] Patient Account Number: [id number] Date of Birth/Sex: Treating RN: 02/14/1951 (72 y.o. [name] Primary Care [name]: [name] Other Clinician: Referring Celine Dishman: Treating [name]/Extender: [name], [name] in Treatment: [phone] Encounter Discharge Information Items Post Procedure  Vitals Discharge Condition: Stable Temperature (F): 98.2 Ambulatory Status: Ambulatory Pulse (bpm): 71 Discharge Destination: Home Respiratory Rate (breaths/min): 18 Transportation: Private Auto Blood Pressure (mmHg): 150/77 Accompanied By: self Schedule Follow-up Appointment: Yes Clinical Summary of Care: Electronic Signature(s) Signed: 05/06/2023 6:08:49 PM By: [name] Stall RN, BSN Entered By: [name] on 05/06/2023 09:49:49 [name] ([id number]) [id number] Page 2 of 7 -------------------------------------------------------------------------------- Lower Extremity Assessment Details Patient Name: Date of Service: [name], [name] 05/06/2023 9:30 A M Medical Record Number: [id number] Patient Account Number: [id number] Date of Birth/Sex: Treating RN: [name], 1952 (72 y.o. F) Primary Care Daylani Deblois: [name] Other Clinician: Referring [name]: Treating [name]/Extender: [name], [name] in Treatment: 141 Electronic Signature(s) Signed: 05/07/2023 4:36:58 PM By: [name] Entered By: [name] on 05/06/2023 09:27:11 -------------------------------------------------------------------------------- Multi Wound Chart Details Patient Name: Date of Service: [name], [name] 05/06/2023 9:30 A M Medical Record Number: [id number] Patient Account Number: [id number] Date of Birth/Sex: Treating RN: 1951/01/30 (72 y.o. F) Primary Care [name]: [name] Other Clinician: Referring [name]: Treating [name]/Extender: [name], [name] in Treatment: 141 Vital Signs Height(in): 63 Pulse(bpm): 71 Weight(lbs): 176 Blood Pressure(mmHg): 150/77 Body Mass Index(BMI): 31.2 Temperature(F): 98.2 Respiratory Rate(breaths/min): 18 [1:Photos: No Photos Right Breast (mastectomy site) Wound Location: Radiation Burn Wounding Event: Soft Tissue Radionecrosis Primary Etiology: Anemia, Lymphedema, Deep Vein  Comorbid History: Thrombosis, Hypertension, Peripheral Venous Disease, Osteoarthritis,  Received Chemotherapy, Received Radiation, Confinement Anxiety 07/26/2020 Date Acquired: 141 Weeks of Treatment: Open Wound Status: No Wound Recurrence: 1.5x2.9x0.3 Measurements L x W x D (cm) 3.416 A (cm) : rea 1.025 Volume (cm) : -123.00% % Reduction  in Area: -569.90% % Reduction in Volume: Full Thickness Without Exposed Classification: Support Structures Medium Exudate Amount: Serosanguineous Exudate Type: red, brown Exudate Color: Distinct, outline attached Wound Margin: Small (1-33%) Granulation  Amount: Large (67-100%) Necrotic Amount: Fat Layer (Subcutaneous Tissue): Yes N/A Exposed Structures: Fascia: No Tendon: No Muscle: No Joint: No Bone: No None Epithelialization:] [N/A:N/A N/A N/A N/A N/A N/A N/A  N/A N/A N/A N/A N/A N/A N/A N/A N/A N/A  N/A N/A N/A N/A N/A] [name], [name] ([id number]) [1:Debridement - Excisional Debridement: 09:40 Pre-procedure Verification/Time Out Taken: Lidocaine 4% Topical Solution Pain Control: Subcutaneous, Slough Tissue Debrided: Skin/Subcutaneous Tissue Level: 2.24 Debridement A (sq cm): rea Curette  Instrument: Minimum Bleeding: Pressure Hemostasis A chieved: 0 Procedural Pain: 0 Post Procedural Pain: Procedure was tolerated well Debridement Treatment Response: 1.5x1.9x0.3 Post Debridement Measurements L x W x D (cm) 0.672 Post Debridement Volume:  (cm) Excoriation: No Periwound Skin Texture: Induration: No Callus: No Crepitus: No Rash: No Scarring: No Maceration: No Periwound Skin Moisture: Dry/Scaly: No Atrophie Blanche: No Periwound Skin Color: Cyanosis: No Ecchymosis: No Erythema: No  Hemosiderin Staining: No Mottled: No Pallor: No Rubor: No No Abnormality Temperature: Debridement Procedures Performed:] [N/A:N/A N/A N/A N/A N/A N/A N/A N/A N/A N/A N/A N/A N/A N/A N/A N/A N/A N/A N/A] Treatment Notes Wound #1 (Breast (mastectomy site)) Wound Laterality: Right Cleanser Wound  Cleanser Discharge Instruction: Cleanse the wound with wound cleanser prior to applying a clean dressing using gauze sponges, not tissue or cotton balls. Byram Ancillary Kit - 15 Day Supply Discharge Instruction: Use supplies as instructed; Kit contains: (15) Saline Bullets; (15) 3x3 Gauze; 15 pr Gloves Soap and Water Discharge Instruction: May shower and wash wound with dial antibacterial soap and water prior to dressing change. Peri-Wound Care Skin Prep Discharge Instruction: Use skin prep as directed Topical Primary Dressing Hydrofera Blue Ready Transfer Foam, 2.5x2.5 (in/in) Discharge Instruction: Apply directly to wound bed as directed Secondary Dressing Woven Gauze Sponge, Non-Sterile 4x4 in Discharge Instruction: Apply over primary dressing as directed. Woven Gauze Sponges 2x2 in Discharge Instruction: Apply over primary dressing, fold in corners to fill the space Zetuvit Plus Silicone Border Dressing 5x5 (in/in) Discharge Instruction: Apply silicone border over primary dressing as directed. Secured With Compression Wrap Compression Stockings Facilities manager) Signed: 05/06/2023 4:[name]:50 PM By: Baltazar Najjar MD Entered By: Baltazar Najjar on 05/06/2023 09:51:34 [name] ([id number]) [id number] Page 4 of 7 -------------------------------------------------------------------------------- Multi-Disciplinary Care Plan Details Patient Name: Date of Service: [name], [place] [name] 05/06/2023 9:30 A M Medical Record Number: [id number] Patient Account Number: [id number] Date of Birth/Sex: Treating RN: 08/16/1951 (72 y.o. [name] Primary Care Kyshawn Teal: [name] Other Clinician: Referring Akemi Overholser: Treating Jadore Mcguffin/Extender: Izetta Dakin in Treatment: 141 Multidisciplinary Care Plan reviewed with physician Active Inactive Wound/Skin Impairment Nursing Diagnoses: Impaired tissue integrity Knowledge  deficit related to ulceration/compromised skin integrity Goals: Patient/caregiver will verbalize understanding of skin care regimen Date Initiated: 08/22/2020 Target Resolution Date: 06/12/2023 Goal Status: Active Ulcer/skin breakdown will have a volume reduction of 30% by week 4 Date Initiated: 08/22/2020 Date Inactivated: [name]/22/2021 Target Resolution Date: [name]/05/2020 Goal Status: Met Ulcer/skin breakdown will have a volume reduction of 50% by week 8 Date Initiated: 06/05/2021 Date Inactivated: 07/17/2021 Target Resolution Date: 07/03/2021 Goal Status: Unmet Unmet Reason: infection Interventions: Assess patient/caregiver ability to obtain necessary supplies Assess patient/caregiver ability to perform ulcer/skin care regimen upon admission and as needed Assess ulceration(s) every visit Treatment Activities: Skin care regimen initiated : 08/22/2020 Topical wound management initiated : 08/22/2020 Notes: 06/05/21: Wound care regimen ongoing. 06/11/22: Wound care regimen continues, applying skin sub ([place]) Electronic Signature(s) Signed: 05/06/2023 6:08:49 PM By: [name] Stall RN, BSN Entered By: [name] on 05/06/2023 09:45:09 -------------------------------------------------------------------------------- Pain Assessment Details Patient Name: Date of Service: [name], [name] 05/06/2023 9:30 A M Medical Record Number: [id number] Patient Account Number: [id number] Date of Birth/Sex: Treating  RN: Mar 08, 1951 (72 y.o. F) Primary Care Nakaiya Beddow: [name] Other Clinician: Referring Travor Royce: Treating Wendell Fiebig/Extender: [name], [name] in Treatment: 141 Active Problems Location of Pain Severity and Description of Pain Patient Has Paino No [name], [name] ([id number]) [id number].pdf Page 5 of 7 Patient Has Paino No Site Locations Pain Management and Medication Current Pain Management: Electronic Signature(s) Signed: 05/06/2023 9:38:44  AM By: [name] Entered By: [name] on 05/06/2023 09:18:22 -------------------------------------------------------------------------------- Patient/Caregiver Education Details Patient Name: Date of Service: [name], [name] 7/24/2024andnbsp9:30 A M Medical Record Number: [id number] Patient Account Number: [id number] Date of Birth/Gender: Treating RN: 06-08-1951 (72 y.o. [name] Primary Care Physician: [name] Other Clinician: Referring Physician: Treating Physician/Extender: Izetta Dakin in Treatment: 141 Education Assessment Education Provided To: Patient Education Topics Provided Wound/Skin Impairment: Handouts: Caring for Your Ulcer Methods: Explain/Verbal Responses: Reinforcements needed Electronic Signature(s) Signed: 05/06/2023 6:08:49 PM By: [name] Stall RN, BSN Entered By: [name] on 05/06/2023 09:45:50 -------------------------------------------------------------------------------- Wound Assessment Details Patient Name: Date of Service: [name], [name] 05/06/2023 9:30 A M Medical Record Number: [id number] Patient Account Number: [id number] [name], [name] ([id number]) [id number].pdf Page 6 of 7 Date of Birth/Sex: Treating RN: 06-03-1951 (72 y.o. F) Primary Care Nautica Hotz: Other Clinician: Charlott [name] Referring Daritza Brees: Treating Malaia Buchta/Extender: [name], [name] in Treatment: 141 Wound Status Wound Number: 1 Primary Soft Tissue Radionecrosis Etiology: Wound Location: Right Breast (mastectomy site) Wound Open Wounding Event: Radiation Burn Status: Date Acquired: 07/26/2020 Comorbid Anemia, Lymphedema, Deep Vein Thrombosis, Hypertension, Weeks Of Treatment: 141 History: Peripheral Venous Disease, Osteoarthritis, Received Clustered Wound: No Chemotherapy, Received Radiation, Confinement Anxiety Wound Measurements Length: (cm) 1.5 Width: (cm) 2.9 Depth: (cm)  0.3 Area: (cm) 3.416 Volume: (cm) 1.025 % Reduction in Area: -123% % Reduction in Volume: -569.9% Epithelialization: None Tunneling: No Undermining: No Wound Description Classification: Full Thickness Without Exposed Support Structures Wound Margin: Distinct, outline attached Exudate Amount: Medium Exudate Type: Serosanguineous Exudate Color: red, brown Foul Odor After Cleansing: No Slough/Fibrino Yes Wound Bed Granulation Amount: Small (1-33%) Exposed Structure Necrotic Amount: Large (67-100%) Fascia Exposed: No Necrotic Quality: Adherent Slough Fat Layer (Subcutaneous Tissue) Exposed: Yes Tendon Exposed: No Muscle Exposed: No Joint Exposed: No Bone Exposed: No Periwound Skin Texture Texture Color No Abnormalities Noted: Yes No Abnormalities Noted: No Atrophie Blanche: No Moisture Cyanosis: No No Abnormalities Noted: Yes Ecchymosis: No Erythema: No Hemosiderin Staining: No Mottled: No Pallor: No Rubor: No Temperature / Pain Temperature: No Abnormality Treatment Notes Wound #1 (Breast (mastectomy site)) Wound Laterality: Right Cleanser Wound Cleanser Discharge Instruction: Cleanse the wound with wound cleanser prior to applying a clean dressing using gauze sponges, not tissue or cotton balls. Byram Ancillary Kit - 15 Day Supply Discharge Instruction: Use supplies as instructed; Kit contains: (15) Saline Bullets; (15) 3x3 Gauze; 15 pr Gloves Soap and Water Discharge Instruction: May shower and wash wound with dial antibacterial soap and water prior to dressing change. Peri-Wound Care Skin Prep Discharge Instruction: Use skin prep as directed Topical Primary Dressing Hydrofera Blue Ready Transfer Foam, 2.5x2.5 (in/in) Discharge Instruction: Apply directly to wound bed as directed [name], [name] ([id number]) [phone].pdf Page 7 of 7 Secondary Dressing Woven Gauze Sponge, Non-Sterile 4x4 in Discharge Instruction: Apply over primary  dressing as directed. Woven Gauze Sponges 2x2 in Discharge Instruction: Apply over primary dressing, fold in corners to fill the space Zetuvit Plus Silicone Border Dressing 5x5 (in/in) Discharge Instruction: Apply silicone border over primary dressing as directed. Secured With Compression Wrap Compression Stockings  Add-Ons Electronic Signature(s) Signed: 05/07/2023 4:36:58 PM By: [name] Entered By: [name] on 05/06/2023 09:27:29 -------------------------------------------------------------------------------- Vitals Details Patient Name: Date of Service: [name], [name] 05/06/2023 9:30 A M Medical Record Number: [id number] Patient Account Number: [id number] Date of Birth/Sex: Treating RN: [birth year] (72 y.o. F) Primary Care Demiya Magno: [name] Other Clinician: Referring Nylen Creque: Treating Leonna Schlee/Extender: [name], [name] in Treatment: 141 Vital Signs Time Taken: 09:17 Temperature (F): 98.2 Height (in): 63 Pulse (bpm): 71 Weight (lbs): 176 Respiratory Rate (breaths/min): 18 Body Mass Index (BMI): 31.2 Blood Pressure (mmHg): 150/77 Reference Range: 80 - 120 mg / dl Electronic Signature(s) Signed: 05/06/2023 9:38:44 AM By: [name] Entered By: [name] on 05/06/2023 09:18:11

## 2023-05-07 NOTE — Progress Notes (Signed)
[name] ([id number]) [id number] Page 1 of 10 Visit Report for 05/06/2023 Debridement Details Patient Name: Date of Service: [name], [place] YE 05/06/2023 9:30 A M Medical Record Number: [id number] Patient Account Number: [id number] Date of Birth/Sex: Treating RN: 01-02-1951 (72 y.o. F) Primary Care Provider: Charlott [name] Other Clinician: Referring Provider: Treating Provider/Extender: [name], [name] in Treatment: 141 Debridement Performed for Assessment: Wound #1 Right Breast (mastectomy site) Performed By: Physician [name] Caul., MD Debridement Type: Debridement Level of Consciousness (Pre-procedure): Awake and Alert Pre-procedure Verification/Time Out Yes - 09:40 Taken: Start Time: 09:41 Pain Control: Lidocaine 4% T opical Solution Percent of Wound Bed Debrided: 100% T Area Debrided (cm): otal 2.24 Tissue and other material debrided: Viable, Non-Viable, Slough, Subcutaneous, Skin: Dermis , Skin: Epidermis, Slough Level: Skin/Subcutaneous Tissue Debridement Description: Excisional Instrument: Curette Bleeding: Minimum Hemostasis Achieved: Pressure End Time: 09:46 Procedural Pain: 0 Post Procedural Pain: 0 Response to Treatment: Procedure was tolerated well Level of Consciousness (Post- Awake and Alert procedure): Post Debridement Measurements of Total Wound Length: (cm) 1.5 Width: (cm) 1.9 Depth: (cm) 0.3 Volume: (cm) 0.672 Character of Wound/Ulcer Post Debridement: Improved Post Procedure Diagnosis Same as Pre-procedure Electronic Signature(s) Signed: 05/06/2023 4:12:50 PM By: [name] Najjar MD Entered By: [name] on 05/06/2023 09:51:42 -------------------------------------------------------------------------------- HPI Details Patient Name: Date of Service: [name], [name] 05/06/2023 9:30 A M Medical Record Number: [id number] Patient Account Number: [id number] Date of Birth/Sex: Treating  RN: Jul 10, 1951 (72 y.o. F) Primary Care Provider: Charlott [name] Other Clinician: Referring Provider: Treating Provider/Extender: [name], [name] in Treatment: 141 History of Present Illness [name], [name] ([id number]) [id number] Page 2 of 10 HPI Description: 08/22/2020 upon evaluation today patient actually appears to be doing somewhat poorly in regard to her mastectomy site on the right chest wall. She had a mastectomy initially in 1998. She had chemotherapy only no radiation following. In 2002 she subsequently did undergo radiation for the first time and then subsequently in 2012 had repeat radiation after having had a finding that was consistent with a relapse of the cancer. 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 She also has radiation damage to the skin over the right chest wall where she had the mastectomy. She was referred to [place] by Dr. Marcelle [name] in [facility] and her oncologist is [name]. Subsequently I want to make sure before we delve into this more deeply that the patient does not have any issues with a return of cancer that needs to be managed by them. 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 Nonetheless she does not remember exactly when her last PET scan was but it was not too recently she tells me. 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 The patient does have hypertension as well. She sees her oncologist next on December 6. 09/12/2020 on evaluation today patient actually appears to be doing excellent in regard to her wound under the right breast location. Currently this is measuring smaller in general seems to be doing very well. She tells me that the collagen is doing a good job and that the dressing  that she has been putting on is not causing any troubles as far pulling on her skin or scar tissue is concerned all of which is excellent news. 10/03/2020 upon  evaluation today patient appears to be doing well with regard to her surgical site from her mastectomy on the right breast region. She tells me that she has had very little bleeding nothing seems to be sticking badly and in general she has been extremely pleased with where things stand today. No fevers, chills, nausea, vomiting, or diarrhea. 10/24/2020 upon evaluation today patient actually appears to be doing excellent in regard to her wound. There is just a very small area still open and to be honest there was minimal slough noted on the surface of the wound nothing that requires sharp debridement. In general I feel like she is actually doing quite well and overall I am pleased. I do think we may switch to silver alginate dressing and also contemplating using a AandE ointment in order to help keep everything moist and the scar tissue region while the alginate keeps it dry enough to heal 11/14/2020 upon evaluation today patient appears to be doing well at this point in regard to her wound. I do feel like that she is making good progress again this is a very difficult region over the surgical site of the right chest wall at the site of mastectomy. Nonetheless I think that we are just a very small area still open I think alginate is doing a good job helping to dry this up and I would recommend this such that we continue with this. 01/02/2021 on evaluation today patient's wound actually appears to be doing decently well. There does not appear to be any signs of active infection which is great news. She does have some slight slough buildup with biofilm on the surface of the wound mainly more biofilm. Nonetheless I was able to gently remove this with saline and gauze as well as a sterile Q-tip. She tolerated that today without complication. 01/30/2021  upon evaluation today patient appears to be doing well with regard to her wound although she still has quite a bit of trouble getting this to close I think the scar tissue and radiation damage is quite significant here unfortunately. There does not appear to be any signs of active infection which is great news. No fevers, chills, nausea, vomiting, or diarrhea. 02/27/2021 upon evaluation today patient appears to be doing excellent in regard to her wound. She has been tolerating the dressing changes without complication and in general I am extremely pleased with where things stand today. There does not appear to be any signs of infection which is great news. No fevers, chills, nausea, vomiting, or diarrhea. With that being said I do think that she still developing some slough buildup. I did discuss with her today the possibility of proceeding with HBO therapy. We have had this discussion before but she really is not quite committed to wanting to do that much and spend that much time in the chamber. She wants to still think about it. 03/27/2021 upon evaluation today patient appears to be doing about the same in regard to her wound. She still developing a lot of slough buildup on the surface of the wound. I did try to clean that away to some degree today. She tolerated that without any pain or complication. Subsequently I am thinking we may want to switch over to [facility] see if this may do better for her. Patient is in agreement with giving that a trial. 05/01/2021 upon evaluation today patient appears to be doing about the same in regard to her wounds. She has been tolerating the dressing changes without complication.  Fortunately there does not appear to be any signs of active infection at this time which is great news. No fevers, chills, nausea, vomiting, or diarrhea.. 06/05/2021 upon evaluation today patient appears to be doing pretty well currently in regard to her wound at the mastectomy site right chest  wall. Overall since we switch back to the alginate she tells me things have significantly improved she is much happier with where things stand currently. Fortunately there does not appear to be any signs of active infection which is great news. No fevers, chills, nausea, vomiting, or diarrhea. 07/10/2021 upon evaluation today patient unfortunately does not appear to be doing nearly as well as what she previously was. There does not appear to be any evidence of new epithelial growth she has a lot of necrotic tissue the base of the wound this is much more significant than what we previously noted. She also has been having increased pain and increased drainage. In general I am very concerned about this especially in light of the fact that she does have a history of breast cancer I want to ensure that were not looking at any cancerous type lesion at this point. Otherwise also think she does need some debridement we did do MolecuLight scanning today. 07/17/2021 upon evaluation today patient appears to be doing well with regard to her wound compared to last week although she still having some erythema I am concerned in this regard. I do think that she fortunately had a negative biopsy which is great news unfortunately she did have Pseudomonas noted as a bacteria present in the culture which I think is good and need to be addressed with Levaquin. I Minna send that into the pharmacy today. We did repeat the MolecuLight screening. 07/31/2021 upon evaluation today patient's wound is actually showing signs of improvement which is great news. Fortunately there does not appear to be any signs of infection currently locally nor systemically and I am very pleased in that regard. With that being said the patient is having some issues here with continued necrotic tissue I think packing with the Dakin's moistened gauze dressing is still in the right leg ago. 08/14/2021 upon evaluation today patient appears to be doing  well with regard to her wound all things considered I do not see any signs of significant infection which is great news. Fortunately there does not appear to be any evidence of infection which is also great news. In general I think that the patient is tolerating the dressing changes well. We been using a Dakin's moistened gauze. 08/28/2021 upon evaluation today patient appears to be doing well with regard to her wound. Worsening signs of definite improvement which is great news and overall very pleased with where we stand today. There is no signs of inflamed tissue or infection which is great as well and I think the Dakin's is doing a great job here. 09/11/2021 upon evaluation today patient appears to be doing well with regard to her wound all things considered. I am can perform some debridement today to clear away some of the necrotic debris with that being said overall I think that she is making decently good progress here which is great news. There does not appear to be any signs of active infection also good news. That in fact is probably the best news in her mind. 09/25/2021 upon evaluation today patient appears to be doing decently well in regard to her wound. Overall I do not see any signs of infection and I think  she is doing quite well. I do believe that we are making headway here although is very slow due to the scarring and the depth of the wound this is a significantly deep wound. 10/16/2021 upon evaluation today patient appears to be doing about the same in regard to the wound on her right chest wall. Fortunately there does not appear to be any signs of active infection locally nor systemically which is great news. With that being said this still was not cleaning up quite as quickly as I would like to see. Fortunately I think that we can definitely give the Santyl another try we tried this in the past and unfortunately did not have a good result but again I think she was infected at that  time as well which was part of the issue. Nonetheless I think currently that we will be able to go ahead and see what we can do about getting this little cleaner with the Santyl. 11/06/2021 upon evaluation today patient appears to be doing well with regard to her wound. This actually appears to be a little bit better with the Earlie Counts am KEERTHI, HAZELL ([id number]) [id number] Page 3 of 10 happy in that regard. Fortunately I do not see any signs of active infection at this time. No fevers, chills, nausea, vomiting, or diarrhea. 12/04/2021 upon evaluation patient appears to be doing well with the Santyl at this point. I am very pleased with where we stand and I think that she is making good progress here. Fortunately I do not see any evidence of active infection locally or systemically at this time which is great news. No fevers, chills, nausea, vomiting, or diarrhea. 01/01/2022 upon evaluation today patient actually is making excellent progress with the Santyl. I am extremely pleased with where we stand today. I do not see any signs of infection. 01-29-2022 upon evaluation today patient appears to be doing well with regard to her wound. This is showing signs of some improvement. It is a little less deep than what it was. Size wise is also slightly smaller but again there still is a significant wound in this area. Fortunately I do not see any evidence of infection and she is doing quite well. 02-26-2022 upon evaluation today patient appears to be doing well with regard to her wound. In fact this is actually the best that I have seen in a very long time. I do not see any evidence of active infection at this time locally or systemically which is great news and overall I think we are on the right track. Nonetheless I do believe that the patient is continuing to show evidence of improvement with the Santyl week by week. 03-26-2022 upon evaluation today patient appears to be doing well  with regard to the wound on her right mastectomy site. She is actually showing signs of excellent improvement in fact there was some bleeding just with a little bit of light cleaning over the area. Fortunately I do not see any signs of active infection locally or systemically at this time which is great news. 04-30-2022 upon evaluation today patient appears to be doing well currently in regard to her wound. She is actually showing some signs of improvement here which is great news. Still this is very very slow. Subsequently I do believe that she may benefit from the use of [place] topical antibiotics for short amount of time before applying a skin substitute I think EpiCord could be doing very well for her as far as that  is concerned. I discussed that with her today. Fortunately I do not see any evidence of active infection locally or systemically at this time which is great news. No fevers, chills, nausea, vomiting, or diarrhea. 05-28-2022 upon evaluation today patient's wound is actually showing signs of significant improvement. Fortunately I do not see any evidence of infection at this time which is great news. No fevers, chills, nausea, vomiting, or diarrhea. With that being said I do believe that the patient is tolerating the dressing changes without complication. We been using the [facility] which I think is helpful. We do have approval for the EpiCord. 06-11-2022 upon evaluation today patient appears to be doing well with regard to her wound she is actually here today for the first application of EpiCord which I think is good to be beneficial for her. I do think that we will probably be able to keep this in place without can be the one thing of question to be honest. 06-18-2022 upon evaluation today patient actually appears to be doing excellent today. She has 1 treatment of the EpiCord underway and the wound surface already looks much better. This will be EpiCord #2 today. Fortunately I think we are  on the right track here. 06-25-2022 upon evaluation today patient appears to be making good progress and overall the patient seems to have tolerated EpiCord without any complication. I do feel like that the wound is improving quite significantly. This is EpiCord #3 today. 07-02-2021 upon evaluation today patient's wound is actually showing signs of improvement. I am actually very pleased with where we stand we have been seeing some improvements in size overall and I think that we are still in the right track although there is a little bit of need for sharp debridement today to clearway some of the necrotic debris and remaining EpiCord which is actually still somewhat adhered to the wound bed. Overall I am extremely pleased though with where we stand. This is EpiCord #4 today. Upon evaluation today patient appears to be doing well with regard to her wound the EpiCord is definitely helping and does seem to be improving the overall status of the wound the surface is dramatically improved compared to what it was at the start of this. The tissue is actually becoming more red than it is just a pale pink and to be honest some of the did not even appear to be pink in the start. Nonetheless I am very pleased with where we stand currently. No fevers, chills, nausea, vomiting, or diarrhea. 07-16-2022 upon evaluation today patient appears to be doing well currently in regard to her wound she is doing well with the EpiCord this is application #6 today. 07-23-2022 upon evaluation today patient's wound actually showing signs of excellent improvement which is great news. Fortunately I think were making progress again this is a very scarred area which now has a pretty good vascular supply which is great news. Overall I think that she is making great progress. This is EpiCord #7 today 10/18; Epicort No. 8. Very minimally smaller. Wounds on the right breast mastectomy site complicated by soft tissue radiation damage.  Unfortunately she lives in [place], 5 days a week transport would be impossible for her to arrange 08-06-2022 upon evaluation today patient's wound is showing signs again of minimal improvement with regard to the wound bed again this is something that would probably respond well to hyperbaric oxygen therapy and I think should we can probably get this approved but she is just not  able to make the transportation from [place] here daily for the 82-month period of time. Nonetheless I do think that she seems to be doing much better with regard to the surface of the wound and I am pleased that regard I am going to go ahead and continue with the EpiCord this is #9 application today. 08-13-2022 upon evaluation today patient's wound again is showing signs of improved quality to the tissue in the base of the wound. Fortunately I do not see any evidence of infection which is great news and overall I am extremely pleased in that regard. In general I do believe that the patient is making progress here. No fevers, chills, nausea, vomiting, or diarrhea. She is here today for EpiCord application #10 11/8; patient with a wound on the lower part of the right breast area. She is apparently had a nice response to epi cord but she has completed this. It is difficult not to look over this lady's history and look at the wound and wonder about the known benefits of hyperbaric oxygen for soft tissue radionecrosis however she is unable to make transportation arrangements. She lives in [place] 08-27-2022 upon evaluation today patient appears to be doing well in regard to her wound. She has been tolerating the dressing changes without complication wheezing using the silver collagen over the past week and she tolerated that without complication. Fortunately I see no evidence of active infection locally or systemically at this time. 09-17-2022 upon evaluation today patient appears to be doing well currently in regard to her wound.  She has been tolerating the dressing changes without complication. Fortunately there does not appear to be any signs of active infection locally or systemically at this time which is great news. No fevers, chills, nausea, vomiting, or diarrhea. 10-15-2022 upon evaluation today patient appears to be doing well currently in regard to her wound. This is showing signs of good improvement which is great news and overall I am extremely pleased with where we stand today. Fortunately there does not appear to be any signs of active infection at this time which is great news as well. No fevers, chills, nausea, vomiting, or diarrhea. 11-12-2022 upon evaluation today patient appears to be doing pretty well in regard to her wound. She has been tolerating the dressing changes without complication and fortunately there does not appear to be any signs of active infection locally nor systemically which is great news. No fevers, chills, nausea, vomiting, or diarrhea. 12-10-2022 upon evaluation today patient appears to be doing well currently in regard to her wound. She has been tolerating the dressing changes without complication. Fortunately there does not appear to be any signs of active infection locally nor systemically which is great news. No fevers, chills, nausea, vomiting, or diarrhea. [name], [name] ([id number]) [id number] Page 4 of 10 01-07-2023 upon evaluation today patient appears to be doing actually better in regard to her wound I am actually very pleased with where things stand. The size is about the same but the base of the wound actually appears to be healthier with more red granulation tissue and there is some slough and biofilm I am going to perform debridement to clear some of this wound today for continuing with collagen at this point she is doing a good job taking care of this. 02-04-2023 upon evaluation today patient appears to be doing well currently in regard to her wound.  She has been tolerating the dressing changes without complication in general I really feel like they were  making excellent progress here. Fortunately I do not see any signs of active infection locally or systemically which is great news. She is going require some debridement but overall I think that though slow this still continues to make some progress little by little. 03-11-2023 upon evaluation today patient's wound is doing roughly about the same. I do not see any signs of overall worsening which is great news and I am very pleased in that regard. With that being said I do believe that she does have a issue here with ongoing radiation damage to this region which is making it very hard to get the close but again we will continue to work as best we can on this. 04-08-2023 upon evaluation today patient appears to be doing well currently in regard to her wound. She has been tolerating the dressing changes without complication. Fortunately I do not see any signs of active infection locally or systemically at this time. 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 She apparently ran a course of epi cord without any improvement now has been on silver collagen for a while without obvious improvement either at least in terms of wound dimensions. She has no other complaints today. She changes her dressing every second day Electronic Signature(s) Signed: 05/06/2023 4:12:50 PM By: [name] Najjar MD Entered By: [name] on 05/06/2023 09:52:54 -------------------------------------------------------------------------------- Physical Exam Details Patient Name: Date of Service: [name], [name] 05/06/2023 9:30 A M Medical Record Number: [id number] Patient Account Number: [id number] Date of Birth/Sex: Treating RN: Feb 25, 1951 (72 y.o. F) Primary Care Provider: Charlott [name] Other Clinician: Referring Provider: Treating  Provider/Extender: [name], [name] in Treatment: 141 Constitutional Patient is hypertensive.. Pulse regular and within target range for patient.Marland Kitchen Respirations regular, non-labored and within target range.. Temperature is normal and within the target range for the patient.Marland Kitchen Appears in no distress. Notes Wound exam; right breast area. Circular wound with some relative depth. I used a #5 curette to remove surface slough of which there was a large relative amount. Very fibrinous the surface also debrided into the subcutaneous tissue hemostasis with direct pressure. There is no exposed muscle or bone. No evidence of infection Electronic Signature(s) Signed: 05/06/2023 4:12:50 PM By: [name] Najjar MD Entered By: [name] on 05/06/2023 09:53:48 -------------------------------------------------------------------------------- Physician Orders Details Patient Name: Date of Service: [name], [name] 05/06/2023 9:30 A M Medical Record Number: [id number] Patient Account Number: [id number] Date of Birth/Sex: Treating RN: 02/25/1951 (72 y.o. [name] Primary Care Provider: Charlott [name] Other Clinician: Referring Provider: Treating Provider/Extender: [name], [name] in Treatment: [phone] Verbal / Phone Orders: No Diagnosis Coding ICD-10 Coding Code Description ALYSEN, SMYLIE ([id number]) [id number] Page 5 of 10 L98.492 Non-pressure chronic ulcer of skin of other sites with fat layer exposed L58.9 Radiodermatitis, unspecified L59.8 Other specified disorders of the skin and subcutaneous tissue related to radiation I10 Essential (primary) hypertension Follow-up Appointments Return appointment in 1 month. Junious Silk, PA on Wednesday's 0930 06/03/2023 room 9 Anesthetic (In clinic) Topical Lidocaine 5% applied to wound bed Cellular or Tissue Based Products daptic or Mepitel. (DO NOT REMOVE). - Cellular or Tissue Based  Product applied to wound bed, secured with steri-strips, cover with A Epicord #1 06/11/22, #2 06/18/22, #3 06/25/22, #4 07/02/22, #5 07/09/22, #6 07/16/22, Epicord #7 07/23/22, Epicord #8 07/30/22, Epicord #9 08/06/2022. Epicord # 10 08/13/22 - no more after 10th application, per PA  we will see how she does after this with collagen for now. Additional Orders / Instructions Follow Nutritious Diet Wound Treatment Wound #1 - Breast (mastectomy site) Wound Laterality: Right Cleanser: Wound Cleanser (Generic) Every Other Day/30 Days Discharge Instructions: Cleanse the wound with wound cleanser prior to applying a clean dressing using gauze sponges, not tissue or cotton balls. Cleanser: Byram Ancillary Kit - 15 Day Supply (Generic) Every Other Day/30 Days Discharge Instructions: Use supplies as instructed; Kit contains: (15) Saline Bullets; (15) 3x3 Gauze; 15 pr Gloves Cleanser: Soap and Water Every Other Day/30 Days Discharge Instructions: May shower and wash wound with dial antibacterial soap and water prior to dressing change. Peri-Wound Care: Skin Prep (Generic) Every Other Day/30 Days Discharge Instructions: Use skin prep as directed Prim Dressing: Hydrofera Blue Ready Transfer Foam, 2.5x2.5 (in/in) (DME) (Dispense As Written) Every Other Day/30 Days ary Discharge Instructions: Apply directly to wound bed as directed Secondary Dressing: Woven Gauze Sponge, Non-Sterile 4x4 in (Generic) Every Other Day/30 Days Discharge Instructions: Apply over primary dressing as directed. Secondary Dressing: Woven Gauze Sponges 2x2 in (Generic) Every Other Day/30 Days Discharge Instructions: Apply over primary dressing, fold in corners to fill the space Secondary Dressing: Zetuvit Plus Silicone Border Dressing 5x5 (in/in) (DME) (Generic) Every Other Day/30 Days Discharge Instructions: Apply silicone border over primary dressing as directed. Electronic Signature(s) Signed: 05/06/2023 4:12:50 PM By: [name] Najjar  MD Signed: 05/06/2023 6:08:49 PM By: Shawn Stall RN, BSN Entered By: Shawn Stall on 05/06/2023 09:50:15 -------------------------------------------------------------------------------- Problem List Details Patient Name: Date of Service: [name], [name] 05/06/2023 9:30 A M Medical Record Number: [id number] Patient Account Number: [id number] Date of Birth/Sex: Treating RN: 06/27/1951 (72 y.o. [name] Primary Care Provider: Charlott [name] Other Clinician: Referring Provider: Treating Provider/Extender: [name], [name] in Treatment: [phone] Active Problems ICD-10 Encounter Code Description Active Date MDM Diagnosis L98.492 Non-pressure chronic ulcer of skin of other sites with fat layer exposed 08/22/2020 No Yes [name], [name] ([id number]) [id number] Page 6 of 10 L58.9 Radiodermatitis, unspecified 08/22/2020 No Yes L59.8 Other specified disorders of the skin and subcutaneous tissue related to 08/22/2020 No Yes radiation I10 Essential (primary) hypertension 08/22/2020 No Yes Inactive Problems Resolved Problems Electronic Signature(s) Signed: 05/06/2023 4:12:50 PM By: [name] Najjar MD Entered By: [name] on 05/06/2023 09:51:26 -------------------------------------------------------------------------------- Progress Note Details Patient Name: Date of Service: [name], [name] 05/06/2023 9:30 A M Medical Record Number: [id number] Patient Account Number: [id number] Date of Birth/Sex: Treating RN: Mar 19, 1951 (72 y.o. F) Primary Care Provider: Charlott [name] Other Clinician: Referring Provider: Treating Provider/Extender: [name], [name] in Treatment: 141 Subjective History of Present Illness (HPI) 08/22/2020 upon evaluation today patient actually appears to be doing somewhat poorly in regard to her mastectomy site on the right chest wall. She had a mastectomy initially in 1998. She had  chemotherapy only no radiation following. In 2002 she subsequently did undergo radiation for the first time and then subsequently in 2012 had repeat radiation after having had a finding that was consistent with a relapse of the cancer. 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 She also has radiation damage to the skin over the right chest wall where she had the mastectomy. She was referred to [place] by Dr. Marcelle [name] in [facility] and her oncologist is [name]. Subsequently I want to make sure before we delve into this more deeply that the patient does not have any issues with a return  of cancer that needs to be managed by them. 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 Nonetheless she does not remember exactly when her last PET scan was but it was not too recently she tells me. 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 The patient does have hypertension as well. She sees her oncologist next on December 6. 09/12/2020 on evaluation today patient actually appears to be doing excellent in regard to her wound under the right breast location. Currently this is measuring smaller in general seems to be doing very well. She tells me that the collagen is doing a good job and that the dressing that she has been putting on is not causing any troubles as far pulling on her skin or scar tissue is concerned all of which is excellent news. 10/03/2020 upon evaluation today patient appears to be doing well with regard to her surgical site from her mastectomy on the right breast region. She tells me that she has had very little bleeding nothing seems to be sticking badly and in general she has been extremely pleased with where things stand today. No fevers, chills, nausea, vomiting, or  diarrhea. 10/24/2020 upon evaluation today patient actually appears to be doing excellent in regard to her wound. There is just a very small area still open and to be honest there was minimal slough noted on the surface of the wound nothing that requires sharp debridement. In general I feel like she is actually doing quite well and overall I am pleased. I do think we may switch to silver alginate dressing and also contemplating using a AandE ointment in order to help keep everything moist and the scar tissue region while the alginate keeps it dry enough to heal 11/14/2020 upon evaluation today patient appears to be doing well at this point in regard to her wound. I do feel like that she is making good progress again this is a very difficult region over the surgical site of the right chest wall at the site of mastectomy. Nonetheless I think that we are just a very small area still open I think alginate is doing a good job helping to dry this up and I would recommend this such that we continue with this. 01/02/2021 on evaluation today patient's wound actually appears to be doing decently well. There does not appear to be any signs of active infection which is great news. She does have some slight slough buildup with biofilm on the surface of the wound mainly more biofilm. Nonetheless I was able to gently remove this with saline and gauze as well as a sterile Q-tip. She tolerated that today without complication. 01/30/2021 upon evaluation today patient appears to be doing well with regard to her wound although she still has quite a bit of trouble getting this to close I think the scar tissue and radiation damage is quite significant here unfortunately. There does not appear to be any signs of active infection which is great news. No fevers, chills, nausea, vomiting, or diarrhea. 02/27/2021 upon evaluation today patient appears to be doing excellent in regard to her wound. She has been tolerating the dressing  changes without [name], [name] ([id number]) [id number] Page 7 of 10 complication and in general I am extremely pleased with where things stand today. There does not appear to be any signs of infection  which is great news. No fevers, chills, nausea, vomiting, or diarrhea. With that being said I do think that she still developing some slough buildup. I did discuss with her today the possibility of proceeding with HBO therapy. We have had this discussion before but she really is not quite committed to wanting to do that much and spend that much time in the chamber. She wants to still think about it. 03/27/2021 upon evaluation today patient appears to be doing about the same in regard to her wound. She still developing a lot of slough buildup on the surface of the wound. I did try to clean that away to some degree today. She tolerated that without any pain or complication. Subsequently I am thinking we may want to switch over to [facility] see if this may do better for her. Patient is in agreement with giving that a trial. 05/01/2021 upon evaluation today patient appears to be doing about the same in regard to her wounds. She has been tolerating the dressing changes without complication. Fortunately there does not appear to be any signs of active infection at this time which is great news. No fevers, chills, nausea, vomiting, or diarrhea.. 06/05/2021 upon evaluation today patient appears to be doing pretty well currently in regard to her wound at the mastectomy site right chest wall. Overall since we switch back to the alginate she tells me things have significantly improved she is much happier with where things stand currently. Fortunately there does not appear to be any signs of active infection which is great news. No fevers, chills, nausea, vomiting, or diarrhea. 07/10/2021 upon evaluation today patient unfortunately does not appear to be doing nearly as well as what she previously  was. There does not appear to be any evidence of new epithelial growth she has a lot of necrotic tissue the base of the wound this is much more significant than what we previously noted. She also has been having increased pain and increased drainage. In general I am very concerned about this especially in light of the fact that she does have a history of breast cancer I want to ensure that were not looking at any cancerous type lesion at this point. Otherwise also think she does need some debridement we did do MolecuLight scanning today. 07/17/2021 upon evaluation today patient appears to be doing well with regard to her wound compared to last week although she still having some erythema I am concerned in this regard. I do think that she fortunately had a negative biopsy which is great news unfortunately she did have Pseudomonas noted as a bacteria present in the culture which I think is good and need to be addressed with Levaquin. I Minna send that into the pharmacy today. We did repeat the MolecuLight screening. 07/31/2021 upon evaluation today patient's wound is actually showing signs of improvement which is great news. Fortunately there does not appear to be any signs of infection currently locally nor systemically and I am very pleased in that regard. With that being said the patient is having some issues here with continued necrotic tissue I think packing with the Dakin's moistened gauze dressing is still in the right leg ago. 08/14/2021 upon evaluation today patient appears to be doing well with regard to her wound all things considered I do not see any signs of significant infection which is great news. Fortunately there does not appear to be any evidence of infection which is also great news. In general I think that the patient  is tolerating the dressing changes well. We been using a Dakin's moistened gauze. 08/28/2021 upon evaluation today patient appears to be doing well with regard to her  wound. Worsening signs of definite improvement which is great news and overall very pleased with where we stand today. There is no signs of inflamed tissue or infection which is great as well and I think the Dakin's is doing a great job here. 09/11/2021 upon evaluation today patient appears to be doing well with regard to her wound all things considered. I am can perform some debridement today to clear away some of the necrotic debris with that being said overall I think that she is making decently good progress here which is great news. There does not appear to be any signs of active infection also good news. That in fact is probably the best news in her mind. 09/25/2021 upon evaluation today patient appears to be doing decently well in regard to her wound. Overall I do not see any signs of infection and I think she is doing quite well. I do believe that we are making headway here although is very slow due to the scarring and the depth of the wound this is a significantly deep wound. 10/16/2021 upon evaluation today patient appears to be doing about the same in regard to the wound on her right chest wall. Fortunately there does not appear to be any signs of active infection locally nor systemically which is great news. With that being said this still was not cleaning up quite as quickly as I would like to see. Fortunately I think that we can definitely give the Santyl another try we tried this in the past and unfortunately did not have a good result but again I think she was infected at that time as well which was part of the issue. Nonetheless I think currently that we will be able to go ahead and see what we can do about getting this little cleaner with the Santyl. 11/06/2021 upon evaluation today patient appears to be doing well with regard to her wound. This actually appears to be a little bit better with the Santyl I am happy in that regard. Fortunately I do not see any signs of active infection at  this time. No fevers, chills, nausea, vomiting, or diarrhea. 12/04/2021 upon evaluation patient appears to be doing well with the Santyl at this point. I am very pleased with where we stand and I think that she is making good progress here. Fortunately I do not see any evidence of active infection locally or systemically at this time which is great news. No fevers, chills, nausea, vomiting, or diarrhea. 01/01/2022 upon evaluation today patient actually is making excellent progress with the Santyl. I am extremely pleased with where we stand today. I do not see any signs of infection. 01-29-2022 upon evaluation today patient appears to be doing well with regard to her wound. This is showing signs of some improvement. It is a little less deep than what it was. Size wise is also slightly smaller but again there still is a significant wound in this area. Fortunately I do not see any evidence of infection and she is doing quite well. 02-26-2022 upon evaluation today patient appears to be doing well with regard to her wound. In fact this is actually the best that I have seen in a very long time. I do not see any evidence of active infection at this time locally or systemically which is great news and  overall I think we are on the right track. Nonetheless I do believe that the patient is continuing to show evidence of improvement with the Santyl week by week. 03-26-2022 upon evaluation today patient appears to be doing well with regard to the wound on her right mastectomy site. She is actually showing signs of excellent improvement in fact there was some bleeding just with a little bit of light cleaning over the area. Fortunately I do not see any signs of active infection locally or systemically at this time which is great news. 04-30-2022 upon evaluation today patient appears to be doing well currently in regard to her wound. She is actually showing some signs of improvement here which is great news. Still this  is very very slow. Subsequently I do believe that she may benefit from the use of Keystone topical antibiotics for short amount of time before applying a skin substitute I think EpiCord could be doing very well for her as far as that is concerned. I discussed that with her today. Fortunately I do not see any evidence of active infection locally or systemically at this time which is great news. No fevers, chills, nausea, vomiting, or diarrhea. 05-28-2022 upon evaluation today patient's wound is actually showing signs of significant improvement. Fortunately I do not see any evidence of infection at this time which is great news. No fevers, chills, nausea, vomiting, or diarrhea. With that being said I do believe that the patient is tolerating the dressing changes without complication. We been using the [facility] which I think is helpful. We do have approval for the EpiCord. 06-11-2022 upon evaluation today patient appears to be doing well with regard to her wound she is actually here today for the first application of EpiCord which I think is good to be beneficial for her. I do think that we will probably be able to keep this in place without can be the one thing of question to be honest. 06-18-2022 upon evaluation today patient actually appears to be doing excellent today. She has 1 treatment of the EpiCord underway and the wound surface already looks much better. This will be EpiCord #2 today. Fortunately I think we are on the right track here. [name], [name] ([id number]) [id number] Page 8 of 10 06-25-2022 upon evaluation today patient appears to be making good progress and overall the patient seems to have tolerated EpiCord without any complication. I do feel like that the wound is improving quite significantly. This is EpiCord #3 today. 07-02-2021 upon evaluation today patient's wound is actually showing signs of improvement. I am actually very pleased with where we stand we have  been seeing some improvements in size overall and I think that we are still in the right track although there is a little bit of need for sharp debridement today to clearway some of the necrotic debris and remaining EpiCord which is actually still somewhat adhered to the wound bed. Overall I am extremely pleased though with where we stand. This is EpiCord #4 today. Upon evaluation today patient appears to be doing well with regard to her wound the EpiCord is definitely helping and does seem to be improving the overall status of the wound the surface is dramatically improved compared to what it was at the start of this. The tissue is actually becoming more red than it is just a pale pink and to be honest some of the did not even appear to be pink in the start. Nonetheless I am very pleased with where we  stand currently. No fevers, chills, nausea, vomiting, or diarrhea. 07-16-2022 upon evaluation today patient appears to be doing well currently in regard to her wound she is doing well with the EpiCord this is application #6 today. 07-23-2022 upon evaluation today patient's wound actually showing signs of excellent improvement which is great news. Fortunately I think were making progress again this is a very scarred area which now has a pretty good vascular supply which is great news. Overall I think that she is making great progress. This is EpiCord #7 today 10/18; Epicort No. 8. Very minimally smaller. Wounds on the right breast mastectomy site complicated by soft tissue radiation damage. Unfortunately she lives in [place], 5 days a week transport would be impossible for her to arrange 08-06-2022 upon evaluation today patient's wound is showing signs again of minimal improvement with regard to the wound bed again this is something that would probably respond well to hyperbaric oxygen therapy and I think should we can probably get this approved but she is just not able to make the transportation from  [place] here daily for the 63-month period of time. Nonetheless I do think that she seems to be doing much better with regard to the surface of the wound and I am pleased that regard I am going to go ahead and continue with the EpiCord this is #9 application today. 08-13-2022 upon evaluation today patient's wound again is showing signs of improved quality to the tissue in the base of the wound. Fortunately I do not see any evidence of infection which is great news and overall I am extremely pleased in that regard. In general I do believe that the patient is making progress here. No fevers, chills, nausea, vomiting, or diarrhea. She is here today for EpiCord application #10 11/8; patient with a wound on the lower part of the right breast area. She is apparently had a nice response to epi cord but she has completed this. It is difficult not to look over this lady's history and look at the wound and wonder about the known benefits of hyperbaric oxygen for soft tissue radionecrosis however she is unable to make transportation arrangements. She lives in [place] 08-27-2022 upon evaluation today patient appears to be doing well in regard to her wound. She has been tolerating the dressing changes without complication wheezing using the silver collagen over the past week and she tolerated that without complication. Fortunately I see no evidence of active infection locally or systemically at this time. 09-17-2022 upon evaluation today patient appears to be doing well currently in regard to her wound. She has been tolerating the dressing changes without complication. Fortunately there does not appear to be any signs of active infection locally or systemically at this time which is great news. No fevers, chills, nausea, vomiting, or diarrhea. 10-15-2022 upon evaluation today patient appears to be doing well currently in regard to her wound. This is showing signs of good improvement which is great news and overall I  am extremely pleased with where we stand today. Fortunately there does not appear to be any signs of active infection at this time which is great news as well. No fevers, chills, nausea, vomiting, or diarrhea. 11-12-2022 upon evaluation today patient appears to be doing pretty well in regard to her wound. She has been tolerating the dressing changes without complication and fortunately there does not appear to be any signs of active infection locally nor systemically which is great news. No fevers, chills, nausea, vomiting, or diarrhea.  12-10-2022 upon evaluation today patient appears to be doing well currently in regard to her wound. She has been tolerating the dressing changes without complication. Fortunately there does not appear to be any signs of active infection locally nor systemically which is great news. No fevers, chills, nausea, vomiting, or diarrhea. 01-07-2023 upon evaluation today patient appears to be doing actually better in regard to her wound I am actually very pleased with where things stand. The size is about the same but the base of the wound actually appears to be healthier with more red granulation tissue and there is some slough and biofilm I am going to perform debridement to clear some of this wound today for continuing with collagen at this point she is doing a good job taking care of this. 02-04-2023 upon evaluation today patient appears to be doing well currently in regard to her wound. She has been tolerating the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

## 2023-05-11 DIAGNOSIS — I10 Essential (primary) hypertension: Secondary | ICD-10-CM | POA: Diagnosis not present

## 2023-05-11 DIAGNOSIS — Z1339 Encounter for screening examination for other mental health and behavioral disorders: Secondary | ICD-10-CM | POA: Diagnosis not present

## 2023-05-11 DIAGNOSIS — Z1389 Encounter for screening for other disorder: Secondary | ICD-10-CM | POA: Diagnosis not present

## 2023-05-11 DIAGNOSIS — Z139 Encounter for screening, unspecified: Secondary | ICD-10-CM | POA: Diagnosis not present

## 2023-05-11 DIAGNOSIS — E782 Mixed hyperlipidemia: Secondary | ICD-10-CM | POA: Diagnosis not present

## 2023-05-11 DIAGNOSIS — Z0000 Encounter for general adult medical examination without abnormal findings: Secondary | ICD-10-CM | POA: Diagnosis not present

## 2023-05-11 DIAGNOSIS — Z136 Encounter for screening for cardiovascular disorders: Secondary | ICD-10-CM | POA: Diagnosis not present

## 2023-05-11 DIAGNOSIS — R7303 Prediabetes: Secondary | ICD-10-CM | POA: Diagnosis not present

## 2023-05-11 DIAGNOSIS — I700 Atherosclerosis of aorta: Secondary | ICD-10-CM | POA: Diagnosis not present

## 2023-05-11 DIAGNOSIS — Z1331 Encounter for screening for depression: Secondary | ICD-10-CM | POA: Diagnosis not present

## 2023-05-11 DIAGNOSIS — Z6834 Body mass index (BMI) 34.0-34.9, adult: Secondary | ICD-10-CM | POA: Diagnosis not present

## 2023-05-11 NOTE — Progress Notes (Signed)
[name], [name] ([id number]) [id number] Page 1 of 11 Visit Report for 03/11/2023 Chief Complaint Document Details Patient Name: Date of Service: [name] 03/11/2023 9:30 A M Medical Record Number: [id number] Patient Account Number: [id number] Date of Birth/Sex: Treating RN: 1950-12-22 (72 y.o. 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 F) Primary Care Provider: Charlott Rakes Other Clinician: Referring Provider: Treating Provider/Extender: Roxanna Mew, [name]: 133 Debridement Performed for Assessment: Wound #1 Right Breast (mastectomy site) Performed By: Physician Lenda Kelp, PA Debridement Type: Debridement Level of Consciousness (Pre-procedure): Awake and Alert Pre-procedure Verification/Time Out Yes - 09:40 Taken: Start Time: 09:41 Pain Control: Lidocaine 4% T opical Solution Percent of Wound Bed Debrided: 100% T Area Debrided (cm): otal 2.53 Tissue and other material debrided: Viable, Non-Viable, Slough, Subcutaneous, Skin: Dermis , Skin: Epidermis, Biofilm, Slough Level: Skin/Subcutaneous Tissue Debridement Description: Excisional Instrument: Curette Bleeding: Minimum Hemostasis Achieved: Pressure End Time: 09:47 Procedural Pain: 0 Post Procedural Pain: 0 Response to  Treatment: Procedure was tolerated well Level of Consciousness (Post- Awake and Alert procedure): Post Debridement Measurements of Total Wound Length: (cm) 1.4 Width: (cm) 2.3 Depth: (cm) 0.3 Volume: (cm) 0.759 Character of Wound/Ulcer Post Debridement: Improved [name] ([id number]) [id number] Page 2 of 11 Post Procedure Diagnosis Same as Pre-procedure Electronic Signature(s) Signed: 05/06/2023 10:47:56 AM By: Pearletha Alfred Signed: 05/11/2023 4:49:10 PM By: Allen Derry PA-C Previous Signature: 03/11/2023 10:12:31 AM Version By: Allen Derry PA-C Entered By: Pearletha Alfred on 05/06/2023 10:47:56 -------------------------------------------------------------------------------- HPI Details Patient Name: Date of Service: [name], [name] 03/11/2023 9:30 A M Medical Record Number: [id number] Patient Account Number: [id number] Date of Birth/Sex: Treating RN: 01/04/1951 (72 y.o. F) Primary Care Provider: Charlott Rakes Other Clinician: Referring Provider: Treating Provider/Extender: Roxanna Mew, [place] Weeks in Treatment: 133 History of Present Illness HPI Description: 08/22/2020 upon evaluation today patient actually appears to be doing somewhat poorly in regard to her mastectomy site on the right chest wall. She had a mastectomy initially in 1998. She had chemotherapy only no radiation following. In 2002 she subsequently did undergo radiation for the first time and then subsequently in 2012 had repeat radiation after having had a finding that was consistent with a relapse of the cancer. 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 She also has radiation damage to the skin over the right chest wall where she had the mastectomy. She was referred to [place] by Dr. Marcelle Overlie in [facility] and her oncologist is Dr. Arlan Organ. Subsequently I want to make sure before we delve into this more deeply that the  patient does not have any issues with a return of cancer that needs to be managed by them. 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 Nonetheless she does not remember exactly when her last PET scan was but it was not too recently she tells me. She does have  a history of a DVT in the right leg in 2013. The patient does have hypertension as well. She sees her oncologist next on December 6. 09/12/2020 on evaluation today patient actually appears to be doing excellent in regard to her wound under the right breast location. Currently this is measuring smaller in general seems to be doing very well. She tells me that the collagen is doing a good job and that the dressing that she has been putting on is not causing any troubles as far pulling on her skin or scar tissue is concerned all of which is excellent news. 10/03/2020 upon evaluation today patient appears to be doing well with regard to her surgical site from her mastectomy on the right breast region. She tells me that she has had very little bleeding nothing seems to be sticking badly and in general she has been extremely pleased with where things stand today. No fevers, chills, nausea, vomiting, or diarrhea. 10/24/2020 upon evaluation today patient actually appears to be doing excellent in regard to her wound. There is just a very small area still open and to be honest there was minimal slough noted on the surface of the wound nothing that requires sharp debridement. In general I feel like she is actually doing quite well and overall I am pleased. I do think we may switch to silver alginate dressing and also contemplating using a AandE ointment in order to help keep everything moist and the scar tissue region while the alginate keeps it dry enough to heal 11/14/2020 upon evaluation today patient appears to be  doing well at this point in regard to her wound. I do feel like that she is making good progress again this is a very difficult region over the surgical site of the right chest wall at the site of mastectomy. Nonetheless I think that we are just a very small area still open I think alginate is doing a good job helping to dry this up and I would recommend this such that we continue with this. 01/02/2021 on evaluation today patient's wound actually appears to be doing decently well. There does not appear to be any signs of active infection which is great news. She does have some slight slough buildup with biofilm on the surface of the wound mainly more biofilm. Nonetheless I was able to gently remove this with saline and gauze as well as a sterile Q-tip. She tolerated that today without complication. 01/30/2021 upon evaluation today patient appears to be doing well with regard to her wound although she still has quite a bit of trouble getting this to close I think the scar tissue and radiation damage is quite significant here unfortunately. There does not appear to be any signs of active infection which is great news. No fevers, chills, nausea, vomiting, or diarrhea. 02/27/2021 upon evaluation today patient appears to be doing excellent in regard to her wound. She has been tolerating the dressing changes without complication and in general I am extremely pleased with where things stand today. There does not appear to be any signs of infection which is great news. No fevers, chills, nausea, vomiting, or diarrhea. With that being said I do think that she still developing some slough buildup. I did discuss with her today the possibility of proceeding with HBO therapy. We have had this discussion before but she really is not quite committed to wanting to do that much and spend that much time in the chamber. She wants to still think about it.  03/27/2021 upon evaluation today patient appears to be doing about the  same in regard to her wound. She still developing a lot of slough buildup on the surface of the wound. I did try to clean that away to some degree today. She tolerated that without any pain or complication. Subsequently I am thinking we may want to switch over to [facility] see if this may do better for her. Patient is in agreement with giving that a trial. 05/01/2021 upon evaluation today patient appears to be doing about the same in regard to her wounds. She has been tolerating the dressing changes without complication. Fortunately there does not appear to be any signs of active infection at this time which is great news. No fevers, chills, nausea, vomiting, or diarrhea.. 06/05/2021 upon evaluation today patient appears to be doing pretty well currently in regard to her wound at the mastectomy site right chest wall. Overall since we switch back to the alginate she tells me things have significantly improved she is much happier with where things stand currently. Fortunately there does not appear to be any signs of active infection which is great news. No fevers, chills, nausea, vomiting, or diarrhea. 07/10/2021 upon evaluation today patient unfortunately does not appear to be doing nearly as well as what she previously was. There does not appear to be any evidence of new epithelial growth she has a lot of necrotic tissue the base of the wound this is much more significant than what we previously noted. She also [name], [name] ([id number]) [id number] Page 3 of 11 has been having increased pain and increased drainage. In general I am very concerned about this especially in light of the fact that she does have a history of breast cancer I want to ensure that were not looking at any cancerous type lesion at this point. Otherwise also think she does need some debridement we did do MolecuLight scanning today. 07/17/2021 upon evaluation today patient appears to be doing well with regard to  her wound compared to last week although she still having some erythema I am concerned in this regard. I do think that she fortunately had a negative biopsy which is great news unfortunately she did have Pseudomonas noted as a bacteria present in the culture which I think is good and need to be addressed with Levaquin. I Minna send that into the pharmacy today. We did repeat the MolecuLight screening. 07/31/2021 upon evaluation today patient's wound is actually showing signs of improvement which is great news. Fortunately there does not appear to be any signs of infection currently locally nor systemically and I am very pleased in that regard. With that being said the patient is having some issues here with continued necrotic tissue I think packing with the Dakin's moistened gauze dressing is still in the right leg ago. 08/14/2021 upon evaluation today patient appears to be doing well with regard to her wound all things considered I do not see any signs of significant infection which is great news. Fortunately there does not appear to be any evidence of infection which is also great news. In general I think that the patient is tolerating the dressing changes well. We been using a Dakin's moistened gauze. 08/28/2021 upon evaluation today patient appears to be doing well with regard to her wound. Worsening signs of definite improvement which is great news and overall very pleased with where we stand today. There is no signs of inflamed tissue or infection which is great as well and I think  the Dakin's is doing a great job here. 09/11/2021 upon evaluation today patient appears to be doing well with regard to her wound all things considered. I am can perform some debridement today to clear away some of the necrotic debris with that being said overall I think that she is making decently good progress here which is great news. There does not appear to be any signs of active infection also good news. That in  fact is probably the best news in her mind. 09/25/2021 upon evaluation today patient appears to be doing decently well in regard to her wound. Overall I do not see any signs of infection and I think she is doing quite well. I do believe that we are making headway here although is very slow due to the scarring and the depth of the wound this is a significantly deep wound. 10/16/2021 upon evaluation today patient appears to be doing about the same in regard to the wound on her right chest wall. Fortunately there does not appear to be any signs of active infection locally nor systemically which is great news. With that being said this still was not cleaning up quite as quickly as I would like to see. Fortunately I think that we can definitely give the Santyl another try we tried this in the past and unfortunately did not have a good result but again I think she was infected at that time as well which was part of the issue. Nonetheless I think currently that we will be able to go ahead and see what we can do about getting this little cleaner with the Santyl. 11/06/2021 upon evaluation today patient appears to be doing well with regard to her wound. This actually appears to be a little bit better with the Santyl I am happy in that regard. Fortunately I do not see any signs of active infection at this time. No fevers, chills, nausea, vomiting, or diarrhea. 12/04/2021 upon evaluation patient appears to be doing well with the Santyl at this point. I am very pleased with where we stand and I think that she is making good progress here. Fortunately I do not see any evidence of active infection locally or systemically at this time which is great news. No fevers, chills, nausea, vomiting, or diarrhea. 01/01/2022 upon evaluation today patient actually is making excellent progress with the Santyl. I am extremely pleased with where we stand today. I do not see any signs of infection. 01-29-2022 upon evaluation today  patient appears to be doing well with regard to her wound. This is showing signs of some improvement. It is a little less deep than what it was. Size wise is also slightly smaller but again there still is a significant wound in this area. Fortunately I do not see any evidence of infection and she is doing quite well. 02-26-2022 upon evaluation today patient appears to be doing well with regard to her wound. In fact this is actually the best that I have seen in a very long time. I do not see any evidence of active infection at this time locally or systemically which is great news and overall I think we are on the right track. Nonetheless I do believe that the patient is continuing to show evidence of improvement with the Santyl week by week. 03-26-2022 upon evaluation today patient appears to be doing well with regard to the wound on her right mastectomy site. She is actually showing signs of excellent improvement in fact there was some bleeding  just with a little bit of light cleaning over the area. Fortunately I do not see any signs of active infection locally or systemically at this time which is great news. 04-30-2022 upon evaluation today patient appears to be doing well currently in regard to her wound. She is actually showing some signs of improvement here which is great news. Still this is very very slow. Subsequently I do believe that she may benefit from the use of Keystone topical antibiotics for short amount of time before applying a skin substitute I think EpiCord could be doing very well for her as far as that is concerned. I discussed that with her today. Fortunately I do not see any evidence of active infection locally or systemically at this time which is great news. No fevers, chills, nausea, vomiting, or diarrhea. 05-28-2022 upon evaluation today patient's wound is actually showing signs of significant improvement. Fortunately I do not see any evidence of infection at this time which is  great news. No fevers, chills, nausea, vomiting, or diarrhea. With that being said I do believe that the patient is tolerating the dressing changes without complication. We been using the [facility] which I think is helpful. We do have approval for the EpiCord. 06-11-2022 upon evaluation today patient appears to be doing well with regard to her wound she is actually here today for the first application of EpiCord which I think is good to be beneficial for her. I do think that we will probably be able to keep this in place without can be the one thing of question to be honest. 06-18-2022 upon evaluation today patient actually appears to be doing excellent today. She has 1 treatment of the EpiCord underway and the wound surface already looks much better. This will be EpiCord #2 today. Fortunately I think we are on the right track here. 06-25-2022 upon evaluation today patient appears to be making good progress and overall the patient seems to have tolerated EpiCord without any complication. I do feel like that the wound is improving quite significantly. This is EpiCord #3 today. 07-02-2021 upon evaluation today patient's wound is actually showing signs of improvement. I am actually very pleased with where we stand we have been seeing some improvements in size overall and I think that we are still in the right track although there is a little bit of need for sharp debridement today to clearway some of the necrotic debris and remaining EpiCord which is actually still somewhat adhered to the wound bed. Overall I am extremely pleased though with where we stand. This is EpiCord #4 today. Upon evaluation today patient appears to be doing well with regard to her wound the EpiCord is definitely helping and does seem to be improving the overall status of the wound the surface is dramatically improved compared to what it was at the start of this. The tissue is actually becoming more red than it is just a pale pink and to  be honest some of the did not even appear to be pink in the start. Nonetheless I am very pleased with where we stand currently. No fevers, chills, nausea, vomiting, or diarrhea. 07-16-2022 upon evaluation today patient appears to be doing well currently in regard to her wound she is doing well with the EpiCord this is application #6 today. 07-23-2022 upon evaluation today patient's wound actually showing signs of excellent improvement which is great news. Fortunately I think were making progress again this is a very scarred area which now has a pretty  good vascular supply which is great news. Overall I think that she is making great progress. This is EpiCord #7 today [name], [name] ([id number]) [id number] Page 4 of 11 10/18; Epicort No. 8. Very minimally smaller. Wounds on the right breast mastectomy site complicated by soft tissue radiation damage. Unfortunately she lives in [place], 5 days a week transport would be impossible for her to arrange 08-06-2022 upon evaluation today patient's wound is showing signs again of minimal improvement with regard to the wound bed again this is something that would probably respond well to hyperbaric oxygen therapy and I think should we can probably get this approved but she is just not able to make the transportation from [place] here daily for the 54-month period of time. Nonetheless I do think that she seems to be doing much better with regard to the surface of the wound and I am pleased that regard I am going to go ahead and continue with the EpiCord this is #9 application today. 08-13-2022 upon evaluation today patient's wound again is showing signs of improved quality to the tissue in the base of the wound. Fortunately I do not see any evidence of infection which is great news and overall I am extremely pleased in that regard. In general I do believe that the patient is making progress here. No fevers, chills, nausea, vomiting, or  diarrhea. She is here today for EpiCord application #10 11/8; patient with a wound on the lower part of the right breast area. She is apparently had a nice response to epi cord but she has completed this. It is difficult not to look over this lady's history and look at the wound and wonder about the known benefits of hyperbaric oxygen for soft tissue radionecrosis however she is unable to make transportation arrangements. She lives in [place] 08-27-2022 upon evaluation today patient appears to be doing well in regard to her wound. She has been tolerating the dressing changes without complication wheezing using the silver collagen over the past week and she tolerated that without complication. Fortunately I see no evidence of active infection locally or systemically at this time. 09-17-2022 upon evaluation today patient appears to be doing well currently in regard to her wound. She has been tolerating the dressing changes without complication. Fortunately there does not appear to be any signs of active infection locally or systemically at this time which is great news. No fevers, chills, nausea, vomiting, or diarrhea. 10-15-2022 upon evaluation today patient appears to be doing well currently in regard to her wound. This is showing signs of good improvement which is great news and overall I am extremely pleased with where we stand today. Fortunately there does not appear to be any signs of active infection at this time which is great news as well. No fevers, chills, nausea, vomiting, or diarrhea. 11-12-2022 upon evaluation today patient appears to be doing pretty well in regard to her wound. She has been tolerating the dressing changes without complication and fortunately there does not appear to be any signs of active infection locally nor systemically which is great news. No fevers, chills, nausea, vomiting, or diarrhea. 12-10-2022 upon evaluation today patient appears to be doing well currently in  regard to her wound. She has been tolerating the dressing changes without complication. Fortunately there does not appear to be any signs of active infection locally nor systemically which is great news. No fevers, chills, nausea, vomiting, or diarrhea. 01-07-2023 upon evaluation today patient appears to be doing actually better in  regard to her wound I am actually very pleased with where things stand. The size is about the same but the base of the wound actually appears to be healthier with more red granulation tissue and there is some slough and biofilm I am going to perform debridement to clear some of this wound today for continuing with collagen at this point she is doing a good job taking care of this. 02-04-2023 upon evaluation today patient appears to be doing well currently in regard to her wound. She has been tolerating the dressing changes without complication in general I really feel like they were making excellent progress here. Fortunately I do not see any signs of active infection locally or systemically which is great news. She is going require some debridement but overall I think that though slow this still continues to make some progress little by little. 03-11-2023 upon evaluation today patient's wound is doing roughly about the same. I do not see any signs of overall worsening which is great news and I am very pleased in that regard. With that being said I do believe that she does have a issue here with ongoing radiation damage to this region which is making it very hard to get the close but again we will continue to work as best we can on this. Electronic Signature(s) Signed: 03/13/2023 12:33:14 PM By: Shawn Stall RN, BSN Signed: 03/13/2023 1:25:16 PM By: Allen Derry PA-C Previous Signature: 03/11/2023 10:53:28 AM Version By: Allen Derry PA-C Entered By: Shawn Stall on 03/13/2023 12:32:31 -------------------------------------------------------------------------------- Physical  Exam Details Patient Name: Date of Service: [name], [name] 03/11/2023 9:30 A M Medical Record Number: [id number] Patient Account Number: [id number] Date of Birth/Sex: Treating RN: [birth year] (72 y.o. F) Primary Care Provider: Charlott Rakes Other Clinician: Referring Provider: Treating Provider/Extender: Roxanna Mew, [name]: 133 Constitutional [name] and well-hydrated in no acute distress. Respiratory normal breathing without difficulty. Psychiatric this patient is able to make decisions and demonstrates good insight into disease process. Alert and Oriented x 3. pleasant and cooperative. Notes Upon inspection patient's wound bed actually showed signs of good granulation epithelization at this point. Fortunately I do not see any evidence of active NATILE, SELINSKY ([id number]) [id number] Page 5 of 11 infection at this time which is great news. In general I think that she is making good progress albeit slowly. Electronic Signature(s) Signed: 03/11/2023 10:54:14 AM By: Allen Derry PA-C Entered By: Allen Derry on 03/11/2023 10:54:13 -------------------------------------------------------------------------------- Physician Orders Details Patient Name: Date of Service: [name], [name] 03/11/2023 9:30 A M Medical Record Number: [id number] Patient Account Number: [id number] Date of Birth/Sex: Treating RN: 10-19-1950 (72 y.o. [name] Primary Care Provider: Charlott Rakes Other Clinician: Referring Provider: Treating Provider/Extender: Roxanna Mew, [name]: [phone] Verbal / Phone Orders: No Diagnosis Coding ICD-10 Coding Code Description [phone] Non-pressure chronic ulcer of skin of other sites with fat layer exposed L58.9 Radiodermatitis, unspecified L59.8 Other specified disorders of the skin and subcutaneous tissue related to radiation I10 Essential (primary) hypertension Follow-up  Appointments Return appointment in 1 month. Junious Silk, PA on Wednesday's 0930 04/08/2023 room 8 Anesthetic (In clinic) Topical Lidocaine 5% applied to wound bed Cellular or Tissue Based Products daptic or Mepitel. (DO NOT REMOVE). - Cellular or Tissue Based Product applied to wound bed, secured with steri-strips, cover with A Epicord #1 06/11/22, #2 06/18/22, #3 06/25/22, #4 07/02/22, #5 07/09/22, #6 07/16/22, Epicord #7 07/23/22, Epicord #8 07/30/22, Epicord #9 08/06/2022.  Epicord # 10 08/13/22 - no more after 10th application, per PA we will see how she does after this with collagen for now. Additional Orders / Instructions Follow Nutritious Diet Wound Treatment Wound #1 - Breast (mastectomy site) Wound Laterality: Right Cleanser: Wound Cleanser (Generic) Every Other Day/30 Days Discharge Instructions: Cleanse the wound with wound cleanser prior to applying a clean dressing using gauze sponges, not tissue or cotton balls. Cleanser: Byram Ancillary Kit - 15 Day Supply (Generic) Every Other Day/30 Days Discharge Instructions: Use supplies as instructed; Kit contains: (15) Saline Bullets; (15) 3x3 Gauze; 15 pr Gloves Cleanser: Soap and Water Every Other Day/30 Days Discharge Instructions: May shower and wash wound with dial antibacterial soap and water prior to dressing change. Peri-Wound Care: Skin Prep (Generic) Every Other Day/30 Days Discharge Instructions: Use skin prep as directed Prim Dressing: Promogran Prisma Matrix, 4.34 (sq in) (silver collagen) (Generic) Every Other Day/30 Days ary Discharge Instructions: Moisten collagen with saline or hydrogel Secondary Dressing: Woven Gauze Sponge, Non-Sterile 4x4 in (Generic) Every Other Day/30 Days Discharge Instructions: Apply over primary dressing as directed. Secondary Dressing: Woven Gauze Sponges 2x2 in (Generic) Every Other Day/30 Days Discharge Instructions: Apply over primary dressing, fold in corners to fill the space Secondary Dressing:  Zetuvit Plus Silicone Border Dressing 5x5 (in/in) (DME) (Generic) Every Other Day/30 Days Discharge Instructions: Apply silicone border over primary dressing as directed. SAKIYAH, LEGRANT ([id number]) [id number] Page 6 of 11 Electronic Signature(s) Signed: 03/13/2023 12:33:14 PM By: Shawn Stall RN, BSN Signed: 03/13/2023 1:25:16 PM By: Allen Derry PA-C Previous Signature: 03/11/2023 5:42:53 PM Version By: Shawn Stall RN, BSN Entered By: Shawn Stall on 03/13/2023 12:31:27 -------------------------------------------------------------------------------- Problem List Details Patient Name: Date of Service: [name], [name] 03/11/2023 9:30 A M Medical Record Number: [id number] Patient Account Number: [id number] Date of Birth/Sex: Treating RN: 1950-11-11 (72 y.o. [name] Primary Care Provider: Charlott Rakes Other Clinician: Referring Provider: Treating Provider/Extender: Roxanna Mew, [place] Weeks in Treatment: [phone] Active Problems ICD-10 Encounter Code Description Active Date MDM Diagnosis [phone] Non-pressure chronic ulcer of skin of other sites with fat layer exposed 08/22/2020 No Yes L58.9 Radiodermatitis, unspecified 08/22/2020 No Yes L59.8 Other specified disorders of the skin and subcutaneous tissue related to 08/22/2020 No Yes radiation I10 Essential (primary) hypertension 08/22/2020 No Yes Inactive Problems Resolved Problems Electronic Signature(s) Signed: 03/11/2023 10:12:13 AM By: Allen Derry PA-C Entered By: Allen Derry on 03/11/2023 10:12:13 -------------------------------------------------------------------------------- Progress Note Details Patient Name: Date of Service: [name], [name] 03/11/2023 9:30 A M Medical Record Number: [id number] Patient Account Number: [id number] Date of Birth/Sex: Treating RN: 08-Jul-1951 (73 y.o. F) Primary Care Provider: Charlott Rakes Other Clinician: Referring Provider: Treating  Provider/Extender: Roxanna Mew, Christiana Fuchs in Treatment: [address] 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 She had a mastectomy initially in 1998. She had chemotherapy only no radiation following. In 2002 she subsequently did undergo radiation for the first time and then subsequently in 2012 had repeat radiation after having had a finding that was consistent with a relapse of the cancer. 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 She also has radiation damage to the skin over the right chest wall where she had the  mastectomy. She was referred to [place] by Dr. Marcelle Overlie in [facility] and her oncologist is Dr. Arlan Organ. Subsequently I want to make sure before we delve into this more deeply that the patient does not have any issues with a return of cancer that needs to be managed by them. 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 Nonetheless she does not remember exactly when her last PET scan was but it was not too recently she tells me. 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 The patient does have hypertension as well. She sees her oncologist next on December 6. 09/12/2020 on evaluation today patient actually appears to be doing excellent in regard to her wound under the right breast location. Currently this is measuring smaller in general seems to be doing very well. She tells me that the collagen is doing a good job and that the dressing  that she has been putting on is not causing any troubles as far pulling on her skin or scar tissue is concerned all of which is excellent news. 10/03/2020 upon evaluation today patient appears to be doing well with regard to her surgical site from her mastectomy on the right breast region. She tells me that she has had very little bleeding nothing seems to be sticking badly and in general she has been extremely pleased with where things stand today. No fevers, chills, nausea, vomiting, or diarrhea. 10/24/2020 upon evaluation today patient actually appears to be doing excellent in regard to her wound. There is just a very small area still open and to be honest there was minimal slough noted on the surface of the wound nothing that requires sharp debridement. In general I feel like she is actually doing quite well and overall I am pleased. I do think we may switch to silver alginate dressing and also contemplating using a AandE ointment in order to help keep everything moist and the scar tissue region while the alginate keeps it dry enough to heal 11/14/2020 upon evaluation today patient appears to be doing well at this point in regard to her wound. I do feel like that she is making good progress again this is a very difficult region over the surgical site of the right chest wall at the site of mastectomy. Nonetheless I think that we are just a very small area still open I think alginate is doing a good job helping to dry this up and I would recommend this such that we continue with this. 01/02/2021 on evaluation today patient's wound actually appears to be doing decently well. There does not appear to be any signs of active infection which is great news. She does have some slight slough buildup with biofilm on the surface of the wound mainly more biofilm. Nonetheless I was able to gently remove this with saline and gauze as well as a sterile Q-tip. She tolerated that today without complication. 01/30/2021  upon evaluation today patient appears to be doing well with regard to her wound although she still has quite a bit of trouble getting this to close I think the scar tissue and radiation damage is quite significant here unfortunately. There does not appear to be any signs of active infection which is great news. No fevers, chills, nausea, vomiting, or diarrhea. 02/27/2021 upon evaluation today patient appears to be doing excellent in  regard to her wound. She has been tolerating the dressing changes without complication and in general I am extremely pleased with where things stand today. There does not appear to be any signs of infection which is great news. No fevers, chills, nausea, vomiting, or diarrhea. With that being said I do think that she still developing some slough buildup. I did discuss with her today the possibility of proceeding with HBO therapy. We have had this discussion before but she really is not quite committed to wanting to do that much and spend that much time in the chamber. She wants to still think about it. 03/27/2021 upon evaluation today patient appears to be doing about the same in regard to her wound. She still developing a lot of slough buildup on the surface of the wound. I did try to clean that away to some degree today. She tolerated that without any pain or complication. Subsequently I am thinking we may want to switch over to [facility] see if this may do better for her. Patient is in agreement with giving that a trial. 05/01/2021 upon evaluation today patient appears to be doing about the same in regard to her wounds. She has been tolerating the dressing changes without complication. Fortunately there does not appear to be any signs of active infection at this time which is great news. No fevers, chills, nausea, vomiting, or diarrhea.. 06/05/2021 upon evaluation today patient appears to be doing pretty well currently in regard to her wound at the mastectomy site right chest  wall. Overall since we switch back to the alginate she tells me things have significantly improved she is much happier with where things stand currently. Fortunately there does not appear to be any signs of active infection which is great news. No fevers, chills, nausea, vomiting, or diarrhea. 07/10/2021 upon evaluation today patient unfortunately does not appear to be doing nearly as well as what she previously was. There does not appear to be any evidence of new epithelial growth she has a lot of necrotic tissue the base of the wound this is much more significant than what we previously noted. She also has been having increased pain and increased drainage. In general I am very concerned about this especially in light of the fact that she does have a history of breast cancer I want to ensure that were not looking at any cancerous type lesion at this point. Otherwise also think she does need some debridement we did do MolecuLight scanning today. 07/17/2021 upon evaluation today patient appears to be doing well with regard to her wound compared to last week although she still having some erythema I am concerned in this regard. I do think that she fortunately had a negative biopsy which is great news unfortunately she did have Pseudomonas noted as a bacteria present in the culture which I think is good and need to be addressed with Levaquin. I Minna send that into the pharmacy today. We did repeat the MolecuLight screening. 07/31/2021 upon evaluation today patient's wound is actually showing signs of improvement which is great news. Fortunately there does not appear to be any signs of infection currently locally nor systemically and I am very pleased in that regard. With that being said the patient is having some issues here with continued necrotic tissue I think packing with the Dakin's moistened gauze dressing is still in the right leg ago. 08/14/2021 upon evaluation today patient appears to be doing  well with regard to her wound all things considered  I do not see any signs of significant infection which is great news. Fortunately there does not appear to be any evidence of infection which is also great news. In general I think that the patient is tolerating the dressing changes well. We been using a Dakin's moistened gauze. 08/28/2021 upon evaluation today patient appears to be doing well with regard to her wound. Worsening signs of definite improvement which is great news and overall very pleased with where we stand today. There is no signs of inflamed tissue or infection which is great as well and I think the Dakin's is doing a great job here. 09/11/2021 upon evaluation today patient appears to be doing well with regard to her wound all things considered. I am can perform some debridement today to clear away some of the necrotic debris with that being said overall I think that she is making decently good progress here which is great news. There does not appear to be any signs of active infection also good news. That in fact is probably the best news in her mind. 09/25/2021 upon evaluation today patient appears to be doing decently well in regard to her wound. Overall I do not see any signs of infection and I think she is doing quite well. I do believe that we are making headway here although is very slow due to the scarring and the depth of the wound this is a significantly deep wound. 10/16/2021 upon evaluation today patient appears to be doing about the same in regard to the wound on her right chest wall. Fortunately there does not appear to be any signs of active infection locally nor systemically which is great news. With that being said this still was not cleaning up quite as quickly as I would like to see. Fortunately I think that we can definitely give the Santyl another try we tried this in the past and unfortunately did not have a good result but again I [name], [name] ([id number])  [id number] Page 8 of 11 think she was infected at that time as well which was part of the issue. Nonetheless I think currently that we will be able to go ahead and see what we can do about getting this little cleaner with the Santyl. 11/06/2021 upon evaluation today patient appears to be doing well with regard to her wound. This actually appears to be a little bit better with the Santyl I am happy in that regard. Fortunately I do not see any signs of active infection at this time. No fevers, chills, nausea, vomiting, or diarrhea. 12/04/2021 upon evaluation patient appears to be doing well with the Santyl at this point. I am very pleased with where we stand and I think that she is making good progress here. Fortunately I do not see any evidence of active infection locally or systemically at this time which is great news. No fevers, chills, nausea, vomiting, or diarrhea. 01/01/2022 upon evaluation today patient actually is making excellent progress with the Santyl. I am extremely pleased with where we stand today. I do not see any signs of infection. 01-29-2022 upon evaluation today patient appears to be doing well with regard to her wound. This is showing signs of some improvement. It is a little less deep than what it was. Size wise is also slightly smaller but again there still is a significant wound in this area. Fortunately I do not see any evidence of infection and she is doing quite well. 02-26-2022 upon evaluation today patient appears to be  doing well with regard to her wound. In fact this is actually the best that I have seen in a very long time. I do not see any evidence of active infection at this time locally or systemically which is great news and overall I think we are on the right track. Nonetheless I do believe that the patient is continuing to show evidence of improvement with the Santyl week by week. 03-26-2022 upon evaluation today patient appears to be doing  well with regard to the wound on her right mastectomy site. She is actually showing signs of excellent improvement in fact there was some bleeding just with a little bit of light cleaning over the area. Fortunately I do not see any signs of active infection locally or systemically at this time which is great news. 04-30-2022 upon evaluation today patient appears to be doing well currently in regard to her wound. She is actually showing some signs of improvement here which is great news. Still this is very very slow. Subsequently I do believe that she may benefit from the use of [place]stone topical antibiotics for short amount of time before applying a skin substitute I think EpiCord could be doing very well for her as far as that is concerned. I discussed that with her today. Fortunately I do not see any evidence of active infection locally or systemically at this time which is great news. No fevers, chills, nausea, vomiting, or diarrhea. 05-28-2022 upon evaluation today patient's wound is actually showing signs of significant improvement. Fortunately I do not see any evidence of infection at this time which is great news. No fevers, chills, nausea, vomiting, or diarrhea. With that being said I do believe that the patient is tolerating the dressing changes without complication. We been using the [facility] which I think is helpful. We do have approval for the EpiCord. 06-11-2022 upon evaluation today patient appears to be doing well with regard to her wound she is actually here today for the first application of EpiCord which I think is good to be beneficial for her. I do think that we will probably be able to keep this in place without can be the one thing of question to be honest. 06-18-2022 upon evaluation today patient actually appears to be doing excellent today. She has 1 treatment of the EpiCord underway and the wound surface already looks much better. This will be EpiCord #2 today. Fortunately I think we  are on the right track here. 06-25-2022 upon evaluation today patient appears to be making good progress and overall the patient seems to have tolerated EpiCord without any complication. I do feel like that the wound is improving quite significantly. This is EpiCord #3 today. 07-02-2021 upon evaluation today patient's wound is actually showing signs of improvement. I am actually very pleased with where we stand we have been seeing some improvements in size overall and I think that we are still in the right track although there is a little bit of need for sharp debridement today to clearway some of the necrotic debris and remaining EpiCord which is actually still somewhat adhered to the wound bed. Overall I am extremely pleased though with where we stand. This is EpiCord #4 today. Upon evaluation today patient appears to be doing well with regard to her wound the EpiCord is definitely helping and does seem to be improving the overall status of the wound the surface is dramatically improved compared to what it was at the start of this. The tissue is actually  becoming more red than it is just a pale pink and to be honest some of the did not even appear to be pink in the start. Nonetheless I am very pleased with where we stand currently. No fevers, chills, nausea, vomiting, or diarrhea. 07-16-2022 upon evaluation today patient appears to be doing well currently in regard to her wound she is doing well with the EpiCord this is application #6 today. 07-23-2022 upon evaluation today patient's wound actually showing signs of excellent improvement which is great news. Fortunately I think were making progress again this is a very scarred area which now has a pretty good vascular supply which is great news. Overall I think that she is making great progress. This is EpiCord #7 today 10/18; Epicort No. 8. Very minimally smaller. Wounds on the right breast mastectomy site complicated by soft tissue radiation damage.  Unfortunately she lives in [place], 5 days a week transport would be impossible for her to arrange 08-06-2022 upon evaluation today patient's wound is showing signs again of minimal improvement with regard to the wound bed again this is something that would probably respond well to hyperbaric oxygen therapy and I think should we can probably get this approved but she is just not able to make the transportation from [place] here daily for the 79-month period of time. Nonetheless I do think that she seems to be doing much better with regard to the surface of the wound and I am pleased that regard I am going to go ahead and continue with the EpiCord this is #9 application today. 08-13-2022 upon evaluation today patient's wound again is showing signs of improved quality to the tissue in the base of the wound. Fortunately I do not see any evidence of infection which is great news and overall I am extremely pleased in that regard. In general I do believe that the patient is making progress here. No fevers, chills, nausea, vomiting, or diarrhea. She is here today for EpiCord application #10 11/8; patient with a wound on the lower part of the right breast area. She is apparently had a nice response to epi cord but she has completed this. It is difficult not to look over this lady's history and look at the wound and wonder about the known benefits of hyperbaric oxygen for soft tissue radionecrosis however she is unable to make transportation arrangements. She lives in [place] 08-27-2022 upon evaluation today patient appears to be doing well in regard to her wound. She has been tolerating the dressing changes without complication wheezing using the silver collagen over the past week and she tolerated that without complication. Fortunately I see no evidence of active infection locally or systemically at this time. 09-17-2022 upon evaluation today patient appears to be doing well currently in regard to her wound.  She has been tolerating the dressing changes without complication. Fortunately there does not appear to be any signs of active infection locally or systemically at this time which is great news. No fevers, chills, nausea, vomiting, or diarrhea. 10-15-2022 upon evaluation today patient appears to be doing well currently in regard to her wound. This is showing signs of good improvement which is great news and overall I am extremely pleased with where we stand today. Fortunately there does not appear to be any signs of active infection at this time which is great news as well. No fevers, chills, nausea, vomiting, or diarrhea. 11-12-2022 upon evaluation today patient appears to be doing pretty well in regard to her wound. She  has been tolerating the dressing changes without complication and fortunately there does not appear to be any signs of active infection locally nor systemically which is great news. No fevers, chills, nausea, vomiting, or diarrhea. [name], [name] ([id number]) [id number] Page 9 of 11 12-10-2022 upon evaluation today patient appears to be doing well currently in regard to her wound. She has been tolerating the dressing changes without complication. Fortunately there does not appear to be any signs of active infection locally nor systemically which is great news. No fevers, chills, nausea, vomiting, or diarrhea. 01-07-2023 upon evaluation today patient appears to be doing actually better in regard to her wound I am actually very pleased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

## 2023-06-01 ENCOUNTER — Inpatient Hospital Stay: Payer: Medicare Other

## 2023-06-01 ENCOUNTER — Inpatient Hospital Stay: Payer: Medicare Other | Attending: Hematology & Oncology | Admitting: Family

## 2023-06-01 ENCOUNTER — Encounter: Payer: Self-pay | Admitting: Family

## 2023-06-01 ENCOUNTER — Other Ambulatory Visit: Payer: Self-pay

## 2023-06-01 DIAGNOSIS — C50911 Malignant neoplasm of unspecified site of right female breast: Secondary | ICD-10-CM | POA: Insufficient documentation

## 2023-06-01 DIAGNOSIS — Z79811 Long term (current) use of aromatase inhibitors: Secondary | ICD-10-CM | POA: Diagnosis not present

## 2023-06-01 DIAGNOSIS — D508 Other iron deficiency anemias: Secondary | ICD-10-CM

## 2023-06-01 DIAGNOSIS — Z170 Estrogen receptor positive status [ER+]: Secondary | ICD-10-CM | POA: Insufficient documentation

## 2023-06-01 DIAGNOSIS — D631 Anemia in chronic kidney disease: Secondary | ICD-10-CM

## 2023-06-01 DIAGNOSIS — C50011 Malignant neoplasm of nipple and areola, right female breast: Secondary | ICD-10-CM | POA: Diagnosis not present

## 2023-06-01 DIAGNOSIS — D509 Iron deficiency anemia, unspecified: Secondary | ICD-10-CM | POA: Insufficient documentation

## 2023-06-01 DIAGNOSIS — Z923 Personal history of irradiation: Secondary | ICD-10-CM | POA: Insufficient documentation

## 2023-06-01 LAB — CBC WITH DIFFERENTIAL (CANCER CENTER ONLY)
Abs Immature Granulocytes: 0.04 10*3/uL (ref 0.00–0.07)
Basophils Absolute: 0 10*3/uL (ref 0.0–0.1)
Basophils Relative: 1 %
Eosinophils Absolute: 0.3 10*3/uL (ref 0.0–0.5)
Eosinophils Relative: 5 %
HCT: 36.1 % (ref 36.0–46.0)
Hemoglobin: 11.1 g/dL — ABNORMAL LOW (ref 12.0–15.0)
Immature Granulocytes: 1 %
Lymphocytes Relative: 29 %
Lymphs Abs: 1.8 10*3/uL (ref 0.7–4.0)
MCH: 29 pg (ref 26.0–34.0)
MCHC: 30.7 g/dL (ref 30.0–36.0)
MCV: 94.3 fL (ref 80.0–100.0)
Monocytes Absolute: 0.5 10*3/uL (ref 0.1–1.0)
Monocytes Relative: 8 %
Neutro Abs: 3.6 10*3/uL (ref 1.7–7.7)
Neutrophils Relative %: 56 %
Platelet Count: 173 10*3/uL (ref 150–400)
RBC: 3.83 MIL/uL — ABNORMAL LOW (ref 3.87–5.11)
RDW: 13.8 % (ref 11.5–15.5)
WBC Count: 6.2 10*3/uL (ref 4.0–10.5)
nRBC: 0 % (ref 0.0–0.2)

## 2023-06-01 LAB — IRON AND IRON BINDING CAPACITY (CC-WL,HP ONLY)
Iron: 62 ug/dL (ref 28–170)
Saturation Ratios: 27 % (ref 10.4–31.8)
TIBC: 234 ug/dL — ABNORMAL LOW (ref 250–450)
UIBC: 172 ug/dL (ref 148–442)

## 2023-06-01 LAB — CMP (CANCER CENTER ONLY)
ALT: 13 U/L (ref 0–44)
AST: 15 U/L (ref 15–41)
Albumin: 4.2 g/dL (ref 3.5–5.0)
Alkaline Phosphatase: 49 U/L (ref 38–126)
Anion gap: 7 (ref 5–15)
BUN: 17 mg/dL (ref 8–23)
CO2: 31 mmol/L (ref 22–32)
Calcium: 9.3 mg/dL (ref 8.9–10.3)
Chloride: 106 mmol/L (ref 98–111)
Creatinine: 0.73 mg/dL (ref 0.44–1.00)
GFR, Estimated: >60 mL/min (ref >60)
Glucose, Bld: 112 mg/dL — ABNORMAL HIGH (ref 70–99)
Potassium: 3.9 mmol/L (ref 3.5–5.1)
Sodium: 144 mmol/L (ref 135–145)
Total Bilirubin: 0.5 mg/dL (ref 0.3–1.2)
Total Protein: 7.4 g/dL (ref 6.5–8.1)

## 2023-06-01 LAB — RETICULOCYTES
Immature Retic Fract: 13.8 % (ref 2.3–15.9)
RBC.: 3.86 MIL/uL — ABNORMAL LOW (ref 3.87–5.11)
Retic Count, Absolute: 48.3 10*3/uL (ref 19.0–186.0)
Retic Ct Pct: 1.3 % (ref 0.4–3.1)

## 2023-06-01 LAB — FERRITIN: Ferritin: 435 ng/mL — ABNORMAL HIGH (ref 11–307)
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
## 2023-06-02 LAB — CANCER ANTIGEN 27.29: CA 27.29: 26.2 U/mL (ref 0.0–38.6)

## 2023-06-03 ENCOUNTER — Encounter (HOSPITAL_BASED_OUTPATIENT_CLINIC_OR_DEPARTMENT_OTHER): Payer: Medicare Other | Attending: Physician Assistant | Admitting: Physician Assistant

## 2023-06-03 DIAGNOSIS — I10 Essential (primary) hypertension: Secondary | ICD-10-CM | POA: Insufficient documentation

## 2023-06-03 DIAGNOSIS — L98492 Non-pressure chronic ulcer of skin of other sites with fat layer exposed: Secondary | ICD-10-CM | POA: Diagnosis not present

## 2023-06-03 DIAGNOSIS — L598 Other specified disorders of the skin and subcutaneous tissue related to radiation: Secondary | ICD-10-CM | POA: Diagnosis not present

## 2023-06-03 DIAGNOSIS — Z9221 Personal history of antineoplastic chemotherapy: Secondary | ICD-10-CM | POA: Diagnosis not present

## 2023-06-03 DIAGNOSIS — Y842 Radiological procedure and radiotherapy as the cause of abnormal reaction of the patient, or of later complication, without mention of misadventure at the time of the procedure: Secondary | ICD-10-CM | POA: Diagnosis not present

## 2023-06-03 DIAGNOSIS — Z853 Personal history of malignant neoplasm of breast: Secondary | ICD-10-CM | POA: Diagnosis not present

## 2023-06-03 DIAGNOSIS — Z9011 Acquired absence of right breast and nipple: Secondary | ICD-10-CM | POA: Diagnosis not present

## 2023-06-03 DIAGNOSIS — Z86718 Personal history of other venous thrombosis and embolism: Secondary | ICD-10-CM | POA: Diagnosis not present

## 2023-06-03 DIAGNOSIS — L589 Radiodermatitis, unspecified: Secondary | ICD-10-CM | POA: Insufficient documentation

## 2023-06-03 NOTE — Progress Notes (Signed)
[name], [name] ([id number]) [id number] Page 1 of 6 Visit Report for 06/03/2023 Arrival Information Details Patient Name: Date of Service: [name] 06/03/2023 9:30 A M Medical Record Number: [id number] Patient Account Number: [id number] Date of Birth/Sex: Treating RN: [birth year] (72 y.o. [name] Primary Care Delawrence Fridman: Charlott Rakes Other Clinician: Referring Margerie Fraiser: Treating Rosbel Buckner/Extender: Roxanna Mew, [name]: 145 Visit Information History Since Last Visit Added or deleted any medications: No Patient Arrived: Ambulatory Any new allergies or adverse reactions: No Arrival Time: 09:42 Had a fall or experienced change in No Accompanied By: self activities of daily living that may affect Transfer Assistance: None risk of falls: Patient Identification Verified: Yes Signs or symptoms of abuse/neglect since last visito No Patient Requires Transmission-Based Precautions: No Hospitalized since last visit: No Patient Has Alerts: No Implantable device outside of the clinic excluding No cellular tissue based products placed in the center since last visit: Pain Present Now: No Electronic Signature(s) Signed: 06/03/2023 12:32:29 PM By: Karie Schwalbe RN Entered By: Karie Schwalbe on 06/03/2023 06:43:41 -------------------------------------------------------------------------------- Encounter Discharge Information Details Patient Name: Date of Service: [name] INS, [name] 06/03/2023 9:30 A M Medical Record Number: [id number] Patient Account Number: [id number] Date of Birth/Sex: Treating RN: [birth year] (72 y.o. [name] Primary Care Andrus Sharp: Charlott Rakes Other Clinician: Referring Tilton Marsalis: Treating Taji Barretto/Extender: Roxanna Mew, [name]: [phone] Encounter Discharge Information Items Post Procedure Vitals Discharge Condition: Stable Temperature (F): 98.4 Ambulatory  Status: Ambulatory Pulse (bpm): 67 Discharge Destination: Home Respiratory Rate (breaths/min): 18 Transportation: Private Auto Blood Pressure (mmHg): 138/71 Accompanied By: friend Schedule Follow-up Appointment: Yes Clinical Summary of Care: Patient Declined Electronic Signature(s) Signed: 06/03/2023 12:32:29 PM By: Karie Schwalbe RN Entered By: Karie Schwalbe on 06/03/2023 09:32:00 [name] ([id number]) [id number] Page 2 of 6 -------------------------------------------------------------------------------- Lower Extremity Assessment Details Patient Name: Date of Service: [name] INS, [name] 06/03/2023 9:30 A M Medical Record Number: [id number] Patient Account Number: [id number] Date of Birth/Sex: Treating RN: 03/31/1951 (72 y.o. [name] Primary Care Danetta Prom: Charlott Rakes Other Clinician: Referring Pinkney Venard: Treating [name] Leanos/Extender: Roxanna Mew, [name]: 145 Electronic Signature(s) Signed: 06/03/2023 12:32:29 PM By: Karie Schwalbe RN Entered By: Karie Schwalbe on 06/03/2023 06:43:55 -------------------------------------------------------------------------------- Multi-Disciplinary Care Plan Details Patient Name: Date of Service: [name] INS, [name] 06/03/2023 9:30 A M Medical Record Number: [id number] Patient Account Number: [id number] Date of Birth/Sex: Treating RN: 09-20-1951 (72 y.o. [name] Primary Care Ladeja Pelham: Charlott Rakes Other Clinician: Referring Mozella Rexrode: Treating Shaquayla Klimas/Extender: Roxanna Mew, Christiana Fuchs in Treatment: 145 Multidisciplinary Care Plan reviewed with physician Active Inactive Wound/Skin Impairment Nursing Diagnoses: Impaired tissue integrity Knowledge deficit related to ulceration/compromised skin integrity Goals: Patient/caregiver will verbalize understanding of skin care regimen Date Initiated: 08/22/2020 Target Resolution Date: 08/12/2023 Goal  Status: Active Ulcer/skin breakdown will have a volume reduction of 30% by week 4 Date Initiated: 08/22/2020 Date Inactivated: 10/03/2020 Target Resolution Date: 09/19/2020 Goal Status: Met Ulcer/skin breakdown will have a volume reduction of 50% by week 8 Date Initiated: 06/05/2021 Date Inactivated: 07/17/2021 Target Resolution Date: 07/03/2021 Goal Status: Unmet Unmet Reason: infection Interventions: Assess patient/caregiver ability to obtain necessary supplies Assess patient/caregiver ability to perform ulcer/skin care regimen upon admission and as needed Assess ulceration(s) every visit Treatment Activities: Skin care regimen initiated : 08/22/2020 Topical wound management initiated : 08/22/2020 Notes: 06/05/21: Wound care regimen ongoing. 06/11/22: Wound care regimen continues, applying skin sub ([place]pic[place]) Electronic Signature(s) Signed: 06/03/2023 12:32:29 PM By: Baruch Merl,  Randa Evens RN Entered By: Karie Schwalbe on 06/03/2023 07:08:17 [name] ([id number]) [id number] Page 3 of 6 -------------------------------------------------------------------------------- Pain Assessment Details Patient Name: Date of Service: [name] INS, [name] 06/03/2023 9:30 A M Medical Record Number: [id number] Patient Account Number: [id number] Date of Birth/Sex: Treating RN: 1951/09/13 (72 y.o. [name] Primary Care Randal Goens: Charlott Rakes Other Clinician: Referring Pearce Littlefield: Treating Anet Logsdon/Extender: Roxanna Mew, [place] Weeks in Treatment: [phone] Active Problems Location of Pain Severity and Description of Pain Patient Has Paino No Site Locations Pain Management and Medication Current Pain Management: Electronic Signature(s) Signed: 06/03/2023 12:32:29 PM By: Karie Schwalbe RN Entered By: Karie Schwalbe on 06/03/2023 06:44:15 -------------------------------------------------------------------------------- Patient/Caregiver Education Details Patient  Name: Date of Service: [name], [name] 8/21/2024andnbsp9:30 A M Medical Record Number: [id number] Patient Account Number: [id number] Date of Birth/Gender: Treating RN: [birth year] (72 y.o. [name] Primary Care Physician: Charlott Rakes Other Clinician: Referring Physician: Treating Physician/Extender: Roxanna Mew, Christiana Fuchs in Treatment: [phone] Education Assessment Education Provided To: Patient Education Topics Provided Wound/Skin Impairment: Methods: Explain/Verbal Responses: State content correctly [place], Lucendia Herrlich ([id number]) [id number] Page 4 of 6 Electronic Signature(s) Signed: 06/03/2023 12:32:29 PM By: Karie Schwalbe RN Entered By: Karie Schwalbe on 06/03/2023 07:08:38 -------------------------------------------------------------------------------- Wound Assessment Details Patient Name: Date of Service: [name] INS, [name] 06/03/2023 9:30 A M Medical Record Number: [id number] Patient Account Number: [id number] Date of Birth/Sex: Treating RN: [birth year] (72 y.o. [name] Primary Care Gervis Gaba: Charlott Rakes Other Clinician: Referring Kataleyah Carducci: Treating Shulamit Donofrio/Extender: Roxanna Mew, [name]: 145 Wound Status Wound Number: 1 Primary Soft Tissue Radionecrosis Etiology: Wound Location: Right Breast (mastectomy site) Wound Open Wounding Event: Radiation Burn Status: Date Acquired: 07/26/2020 Comorbid Anemia, Lymphedema, Deep Vein Thrombosis, Hypertension, Weeks Of Treatment: 145 History: Peripheral Venous Disease, Osteoarthritis, Received Clustered Wound: No Chemotherapy, Received Radiation, Confinement Anxiety Photos Wound Measurements Length: (cm) 1.5 Width: (cm) 2.8 Depth: (cm) 0.3 Area: (cm) 3.299 Volume: (cm) 0.99 % Reduction in Area: -115.3% % Reduction in Volume: -547.1% Epithelialization: Small (1-33%) Tunneling: No Undermining: No Wound Description Classification:  Full Thickness Without Exposed Support Wound Margin: Distinct, outline attached Exudate Amount: Medium Exudate Type: Serosanguineous Exudate Color: red, brown Structures Foul Odor After Cleansing: No Slough/Fibrino Yes Wound Bed Granulation Amount: Small (1-33%) Exposed Structure Necrotic Amount: Large (67-100%) Fascia Exposed: No Necrotic Quality: Adherent Slough Fat Layer (Subcutaneous Tissue) Exposed: Yes Tendon Exposed: No Muscle Exposed: No Joint Exposed: No Bone Exposed: No Periwound Skin Texture Texture Color No Abnormalities Noted: Yes No Abnormalities Noted: No [address]Ray[address]elle Fanning ARLISSA, GRALL ([id number]) [id number] Page 5 of 6 Cyanosis: No No Abnormalities Noted: Yes Ecchymosis: No Erythema: No Hemosiderin Staining: No Mottled: No Pallor: No Rubor: No Temperature / Pain Temperature: No Abnormality Treatment Notes Wound #1 (Breast (mastectomy site)) Wound Laterality: Right Cleanser Wound Cleanser Discharge Instruction: Cleanse the wound with wound cleanser prior to applying a clean dressing using gauze sponges, not tissue or cotton balls. [employer]ram Ancillary Kit - 15 Day Supply Discharge Instruction: Use supplies as instructed; Kit contains: (15) Saline Bullets; (15) 3x3 Gauze; 15 pr Gloves Soap and Water Discharge Instruction: May shower and wash wound with dial antibacterial soap and water prior to dressing change. Peri-Wound Care Skin Prep Discharge Instruction: Use skin prep as directed Topical Primary Dressing Hydrofera Blue Ready Transfer Foam, 2.5x2.5 (in/in) Discharge Instruction: Apply directly to wound bed as directed Secondary Dressing Woven Gauze Sponge, Non-Sterile 4x4 in Discharge Instruction: Apply over primary dressing as directed. Woven Gauze Sponges  2x2 in Discharge Instruction: Apply over primary dressing, fold in corners to fill the space Zetuvit Plus Silicone Border Dressing 5x5 (in/in) Discharge  Instruction: Apply silicone border over primary dressing as directed. Secured With Compression Wrap Compression Stockings Facilities manager) Signed: 06/03/2023 12:32:29 PM By: Karie Schwalbe RN Entered By: Karie Schwalbe on 06/03/2023 06:57:01 -------------------------------------------------------------------------------- Vitals Details Patient Name: Date of Service: [name] INS, [name] 06/03/2023 9:30 A M Medical Record Number: [id number] Patient Account Number: [id number] Date of Birth/Sex: Treating RN: April 21, 1951 (72 y.o. [name] Primary Care Dustin Burrill: Charlott Rakes Other Clinician: Referring Raeshawn Tafolla: Treating Seanpaul Preece/Extender: Roxanna Mew, [name]: 145 Vital Signs Time Taken: 09:44 Temperature (F): 98.4 Height (in): 63 Pulse (bpm): 67 Weight (lbs): 176 Respiratory Rate (breaths/min): 18 Schonberger, Tamecka ([id number]) [id number] Page 6 of 6 Body Mass Index (BMI): 31.2 Blood Pressure (mmHg): 138/71 Reference Range: 80 - 120 mg / dl Electronic Signature(s) Signed: 06/03/2023 12:32:29 PM By: Karie Schwalbe RN Entered By: Karie Schwalbe on 06/03/2023 06:51:47

## 2023-06-03 NOTE — Progress Notes (Signed)
[name], [name] ([id number]) [id number] Page 1 of 11 Visit Report for 06/03/2023 Chief Complaint Document Details Patient Name: Date of Service: [name] 06/03/2023 9:30 A M Medical Record Number: [id number] Patient Account Number: [id number] Date of Birth/Sex: Treating RN: 10-22-1950 (72 y.o. 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 [name] Primary Care Provider: Charlott Rakes Other Clinician: Referring Provider: Treating Provider/Extender: Roxanna Mew, [name]: 145 Debridement Performed for Assessment: Wound #1 Right Breast (mastectomy site) Performed By: Physician Lenda Kelp, PA Debridement Type: Debridement Level of Consciousness (Pre-procedure): Awake and Alert Pre-procedure Verification/Time Out Yes - 10:09 Taken: Start Time: 10:09 Pain Control: Lidocaine 4% T opical Solution Percent of Wound Bed Debrided: 100% T Area Debrided (cm): otal 3.3 Tissue and other material debrided: Viable, Non-Viable, Slough, Subcutaneous, Slough Level: Skin/Subcutaneous Tissue Debridement Description: Excisional Instrument: Curette Bleeding: Minimum Hemostasis Achieved: Pressure End Time: 10:11 Procedural Pain: 0 Post Procedural Pain: 0 Response to Treatment: Procedure was  tolerated well Level of Consciousness (Post- Awake and Alert procedure): Post Debridement Measurements of Total Wound Length: (cm) 1.5 Width: (cm) 2.8 Depth: (cm) 0.3 Volume: (cm) 0.99 Character of Wound/Ulcer Post Debridement: Improved [name] ([id number]) [id number] Page 2 of 11 Post Procedure Diagnosis Same as Pre-procedure Notes Scribed for Allen Derry PA by J.Scotton RN Electronic Signature(s) Signed: 06/03/2023 12:32:29 PM By: Karie Schwalbe RN Signed: 06/03/2023 5:16:52 PM By: Allen Derry PA-C Entered By: Karie Schwalbe on 06/03/2023 07:13:04 -------------------------------------------------------------------------------- HPI Details Patient Name: Date of Service: [name], [name] 06/03/2023 9:30 A M Medical Record Number: [id number] Patient Account Number: [id number] Date of Birth/Sex: Treating RN: [birth year] (72 y.o. F) Primary Care Provider: Charlott Rakes Other Clinician: Referring Provider: Treating Provider/Extender: Roxanna Mew, Para March Weeks in Treatment: 145 History of Present Illness HPI Description: 08/22/2020 upon evaluation today patient actually appears to be doing somewhat poorly in regard to her mastectomy site on the right chest wall. She had a mastectomy initially in 1998. She had chemotherapy only no radiation following. In 2002 she subsequently did undergo radiation for the first time and then subsequently in 2012 had repeat radiation after having had a finding that was consistent with a relapse of the cancer. 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 She also has radiation damage to the skin over the right chest wall where she had the mastectomy. She was referred to [place] by Dr. Marcelle Overlie in [facility] and her oncologist is Dr. Arlan Organ. Subsequently I want to make sure before we delve into this more deeply that the patient does not have any issues with a  return of cancer that needs to be managed by them. 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 Nonetheless she does not remember exactly when her last PET scan was but it was not too recently she tells me. She does have a history of a  DVT in the right leg in 2013. The patient does have hypertension as well. She sees her oncologist next on December 6. 09/12/2020 on evaluation today patient actually appears to be doing excellent in regard to her wound under the right breast location. Currently this is measuring smaller in general seems to be doing very well. She tells me that the collagen is doing a good job and that the dressing that she has been putting on is not causing any troubles as far pulling on her skin or scar tissue is concerned all of which is excellent news. 10/03/2020 upon evaluation today patient appears to be doing well with regard to her surgical site from her mastectomy on the right breast region. She tells me that she has had very little bleeding nothing seems to be sticking badly and in general she has been extremely pleased with where things stand today. No fevers, chills, nausea, vomiting, or diarrhea. 10/24/2020 upon evaluation today patient actually appears to be doing excellent in regard to her wound. There is just a very small area still open and to be honest there was minimal slough noted on the surface of the wound nothing that requires sharp debridement. In general I feel like she is actually doing quite well and overall I am pleased. I do think we may switch to silver alginate dressing and also contemplating using a AandE ointment in order to help keep everything moist and the scar tissue region while the alginate keeps it dry enough to heal 11/14/2020 upon evaluation today patient appears to be doing well at this point in regard to  her wound. I do feel like that she is making good progress again this is a very difficult region over the surgical site of the right chest wall at the site of mastectomy. Nonetheless I think that we are just a very small area still open I think alginate is doing a good job helping to dry this up and I would recommend this such that we continue with this. 01/02/2021 on evaluation today patient's wound actually appears to be doing decently well. There does not appear to be any signs of active infection which is great news. She does have some slight slough buildup with biofilm on the surface of the wound mainly more biofilm. Nonetheless I was able to gently remove this with saline and gauze as well as a sterile Q-tip. She tolerated that today without complication. 01/30/2021 upon evaluation today patient appears to be doing well with regard to her wound although she still has quite a bit of trouble getting this to close I think the scar tissue and radiation damage is quite significant here unfortunately. There does not appear to be any signs of active infection which is great news. No fevers, chills, nausea, vomiting, or diarrhea. 02/27/2021 upon evaluation today patient appears to be doing excellent in regard to her wound. She has been tolerating the dressing changes without complication and in general I am extremely pleased with where things stand today. There does not appear to be any signs of infection which is great news. No fevers, chills, nausea, vomiting, or diarrhea. With that being said I do think that she still developing some slough buildup. I did discuss with her today the possibility of proceeding with HBO therapy. We have had this discussion before but she really is not quite committed to wanting to do that much and spend that much time in the chamber. She wants to still think about it. 03/27/2021 upon evaluation today  patient appears to be doing about the same in regard to her wound. She still  developing a lot of slough buildup on the surface of the wound. I did try to clean that away to some degree today. She tolerated that without any pain or complication. Subsequently I am thinking we may want to switch over to [facility] see if this may do better for her. Patient is in agreement with giving that a trial. 05/01/2021 upon evaluation today patient appears to be doing about the same in regard to her wounds. She has been tolerating the dressing changes without complication. Fortunately there does not appear to be any signs of active infection at this time which is great news. No fevers, chills, nausea, vomiting, or diarrhea.. 06/05/2021 upon evaluation today patient appears to be doing pretty well currently in regard to her wound at the mastectomy site right chest wall. Overall since we switch back to the alginate she tells me things have significantly improved she is much happier with where things stand currently. Fortunately there does not appear to be any signs of active infection which is great news. No fevers, chills, nausea, vomiting, or diarrhea. TUESDAE, SENTMAN ([id number]) [id number] Page 3 of 11 07/10/2021 upon evaluation today patient unfortunately does not appear to be doing nearly as well as what she previously was. There does not appear to be any evidence of new epithelial growth she has a lot of necrotic tissue the base of the wound this is much more significant than what we previously noted. She also has been having increased pain and increased drainage. In general I am very concerned about this especially in light of the fact that she does have a history of breast cancer I want to ensure that were not looking at any cancerous type lesion at this point. Otherwise also think she does need some debridement we did do MolecuLight scanning today. 07/17/2021 upon evaluation today patient appears to be doing well with regard to her wound compared to last week  although she still having some erythema I am concerned in this regard. I do think that she fortunately had a negative biopsy which is great news unfortunately she did have Pseudomonas noted as a bacteria present in the culture which I think is good and need to be addressed with Levaquin. I Minna send that into the pharmacy today. We did repeat the MolecuLight screening. 07/31/2021 upon evaluation today patient's wound is actually showing signs of improvement which is great news. Fortunately there does not appear to be any signs of infection currently locally nor systemically and I am very pleased in that regard. With that being said the patient is having some issues here with continued necrotic tissue I think packing with the Dakin's moistened gauze dressing is still in the right leg ago. 08/14/2021 upon evaluation today patient appears to be doing well with regard to her wound all things considered I do not see any signs of significant infection which is great news. Fortunately there does not appear to be any evidence of infection which is also great news. In general I think that the patient is tolerating the dressing changes well. We been using a Dakin's moistened gauze. 08/28/2021 upon evaluation today patient appears to be doing well with regard to her wound. Worsening signs of definite improvement which is great news and overall very pleased with where we stand today. There is no signs of inflamed tissue or infection which is great as well and I think the Dakin's is doing  a great job here. 09/11/2021 upon evaluation today patient appears to be doing well with regard to her wound all things considered. I am can perform some debridement today to clear away some of the necrotic debris with that being said overall I think that she is making decently good progress here which is great news. There does not appear to be any signs of active infection also good news. That in fact is probably the best news  in her mind. 09/25/2021 upon evaluation today patient appears to be doing decently well in regard to her wound. Overall I do not see any signs of infection and I think she is doing quite well. I do believe that we are making headway here although is very slow due to the scarring and the depth of the wound this is a significantly deep wound. 10/16/2021 upon evaluation today patient appears to be doing about the same in regard to the wound on her right chest wall. Fortunately there does not appear to be any signs of active infection locally nor systemically which is great news. With that being said this still was not cleaning up quite as quickly as I would like to see. Fortunately I think that we can definitely give the Santyl another try we tried this in the past and unfortunately did not have a good result but again I think she was infected at that time as well which was part of the issue. Nonetheless I think currently that we will be able to go ahead and see what we can do about getting this little cleaner with the Santyl. 11/06/2021 upon evaluation today patient appears to be doing well with regard to her wound. This actually appears to be a little bit better with the Santyl I am happy in that regard. Fortunately I do not see any signs of active infection at this time. No fevers, chills, nausea, vomiting, or diarrhea. 12/04/2021 upon evaluation patient appears to be doing well with the Santyl at this point. I am very pleased with where we stand and I think that she is making good progress here. Fortunately I do not see any evidence of active infection locally or systemically at this time which is great news. No fevers, chills, nausea, vomiting, or diarrhea. 01/01/2022 upon evaluation today patient actually is making excellent progress with the Santyl. I am extremely pleased with where we stand today. I do not see any signs of infection. 01-29-2022 upon evaluation today patient appears to be doing well  with regard to her wound. This is showing signs of some improvement. It is a little less deep than what it was. Size wise is also slightly smaller but again there still is a significant wound in this area. Fortunately I do not see any evidence of infection and she is doing quite well. 02-26-2022 upon evaluation today patient appears to be doing well with regard to her wound. In fact this is actually the best that I have seen in a very long time. I do not see any evidence of active infection at this time locally or systemically which is great news and overall I think we are on the right track. Nonetheless I do believe that the patient is continuing to show evidence of improvement with the Santyl week by week. 03-26-2022 upon evaluation today patient appears to be doing well with regard to the wound on her right mastectomy site. She is actually showing signs of excellent improvement in fact there was some bleeding just with a little  bit of light cleaning over the area. Fortunately I do not see any signs of active infection locally or systemically at this time which is great news. 04-30-2022 upon evaluation today patient appears to be doing well currently in regard to her wound. She is actually showing some signs of improvement here which is great news. Still this is very very slow. Subsequently I do believe that she may benefit from the use of Keystone topical antibiotics for short amount of time before applying a skin substitute I think EpiCord could be doing very well for her as far as that is concerned. I discussed that with her today. Fortunately I do not see any evidence of active infection locally or systemically at this time which is great news. No fevers, chills, nausea, vomiting, or diarrhea. 05-28-2022 upon evaluation today patient's wound is actually showing signs of significant improvement. Fortunately I do not see any evidence of infection at this time which is great news. No fevers, chills,  nausea, vomiting, or diarrhea. With that being said I do believe that the patient is tolerating the dressing changes without complication. We been using the [facility] which I think is helpful. We do have approval for the EpiCord. 06-11-2022 upon evaluation today patient appears to be doing well with regard to her wound she is actually here today for the first application of EpiCord which I think is good to be beneficial for her. I do think that we will probably be able to keep this in place without can be the one thing of question to be honest. 06-18-2022 upon evaluation today patient actually appears to be doing excellent today. She has 1 treatment of the EpiCord underway and the wound surface already looks much better. This will be EpiCord #2 today. Fortunately I think we are on the right track here. 06-25-2022 upon evaluation today patient appears to be making good progress and overall the patient seems to have tolerated EpiCord without any complication. I do feel like that the wound is improving quite significantly. This is EpiCord #3 today. 07-02-2021 upon evaluation today patient's wound is actually showing signs of improvement. I am actually very pleased with where we stand we have been seeing some improvements in size overall and I think that we are still in the right track although there is a little bit of need for sharp debridement today to clearway some of the necrotic debris and remaining EpiCord which is actually still somewhat adhered to the wound bed. Overall I am extremely pleased though with where we stand. This is EpiCord #4 today. Upon evaluation today patient appears to be doing well with regard to her wound the EpiCord is definitely helping and does seem to be improving the overall status of the wound the surface is dramatically improved compared to what it was at the start of this. The tissue is actually becoming more red than it is just a pale pink and to be honest some of the did not  even appear to be pink in the start. Nonetheless I am very pleased with where we stand currently. No fevers, chills, nausea, vomiting, or diarrhea. 07-16-2022 upon evaluation today patient appears to be doing well currently in regard to her wound she is doing well with the EpiCord this is application #6 today. 07-23-2022 upon evaluation today patient's wound actually showing signs of excellent improvement which is great news. Fortunately I think were making [name], [name] ([id number]) [id number] Page 4 of 11 progress again this is a very scarred area which  now has a pretty good vascular supply which is great news. Overall I think that she is making great progress. This is EpiCord #7 today 10/18; Epicort No. 8. Very minimally smaller. Wounds on the right breast mastectomy site complicated by soft tissue radiation damage. Unfortunately she lives in [place], 5 days a week transport would be impossible for her to arrange 08-06-2022 upon evaluation today patient's wound is showing signs again of minimal improvement with regard to the wound bed again this is something that would probably respond well to hyperbaric oxygen therapy and I think should we can probably get this approved but she is just not able to make the transportation from [place] here daily for the 66-month period of time. Nonetheless I do think that she seems to be doing much better with regard to the surface of the wound and I am pleased that regard I am going to go ahead and continue with the EpiCord this is #9 application today. 08-13-2022 upon evaluation today patient's wound again is showing signs of improved quality to the tissue in the base of the wound. Fortunately I do not see any evidence of infection which is great news and overall I am extremely pleased in that regard. In general I do believe that the patient is making progress here. No fevers, chills, nausea, vomiting, or diarrhea. She is here today for  EpiCord application #10 11/8; patient with a wound on the lower part of the right breast area. She is apparently had a nice response to epi cord but she has completed this. It is difficult not to look over this lady's history and look at the wound and wonder about the known benefits of hyperbaric oxygen for soft tissue radionecrosis however she is unable to make transportation arrangements. She lives in [place] 08-27-2022 upon evaluation today patient appears to be doing well in regard to her wound. She has been tolerating the dressing changes without complication wheezing using the silver collagen over the past week and she tolerated that without complication. Fortunately I see no evidence of active infection locally or systemically at this time. 09-17-2022 upon evaluation today patient appears to be doing well currently in regard to her wound. She has been tolerating the dressing changes without complication. Fortunately there does not appear to be any signs of active infection locally or systemically at this time which is great news. No fevers, chills, nausea, vomiting, or diarrhea. 10-15-2022 upon evaluation today patient appears to be doing well currently in regard to her wound. This is showing signs of good improvement which is great news and overall I am extremely pleased with where we stand today. Fortunately there does not appear to be any signs of active infection at this time which is great news as well. No fevers, chills, nausea, vomiting, or diarrhea. 11-12-2022 upon evaluation today patient appears to be doing pretty well in regard to her wound. She has been tolerating the dressing changes without complication and fortunately there does not appear to be any signs of active infection locally nor systemically which is great news. No fevers, chills, nausea, vomiting, or diarrhea. 12-10-2022 upon evaluation today patient appears to be doing well currently in regard to her wound. She has been  tolerating the dressing changes without complication. Fortunately there does not appear to be any signs of active infection locally nor systemically which is great news. No fevers, chills, nausea, vomiting, or diarrhea. 01-07-2023 upon evaluation today patient appears to be doing actually better in regard to her wound  I am actually very pleased with where things stand. The size is about the same but the base of the wound actually appears to be healthier with more red granulation tissue and there is some slough and biofilm I am going to perform debridement to clear some of this wound today for continuing with collagen at this point she is doing a good job taking care of this. 02-04-2023 upon evaluation today patient appears to be doing well currently in regard to her wound. She has been tolerating the dressing changes without complication in general I really feel like they were making excellent progress here. Fortunately I do not see any signs of active infection locally or systemically which is great news. She is going require some debridement but overall I think that though slow this still continues to make some progress little by little. 03-11-2023 upon evaluation today patient's wound is doing roughly about the same. I do not see any signs of overall worsening which is great news and I am very pleased in that regard. With that being said I do believe that she does have a issue here with ongoing radiation damage to this region which is making it very hard to get the close but again we will continue to work as best we can on this. 04-08-2023 upon evaluation today patient appears to be doing well currently in regard to her wound. She has been tolerating the dressing changes without complication. Fortunately I do not see any signs of active infection locally or systemically at this time. 7/24; monthly visit for this patient who has an open wound in the right breast area largely secondary to soft tissue  radionecrosis. She apparently ran a course of epi cord without any improvement now has been on silver collagen for a while without obvious improvement either at least in terms of wound dimensions. She has no other complaints today. She changes her dressing every second day 06-03-2023 upon evaluation today patient appears to be doing well currently in regard to her wound. She has been tolerating the dressing changes without complication. Fortunately I do not see any signs of active infection locally nor systemically at this time. Electronic Signature(s) Signed: 06/03/2023 10:34:04 AM By: Allen Derry PA-C Entered By: Allen Derry on 06/03/2023 07:34:04 -------------------------------------------------------------------------------- Physical Exam Details Patient Name: Date of Service: [name], [name] 06/03/2023 9:30 A M Medical Record Number: [id number] Patient Account Number: [id number] Date of Birth/Sex: Treating RN: 11-18-1950 (73 y.o. F) Primary Care Provider: Charlott Rakes Other Clinician: Referring Provider: Treating Provider/Extender: Roxanna Mew, [name]: [address] [place], [name] ([id number]) [id number] Page 5 of 11 [name] and well-hydrated in no acute distress. Respiratory normal breathing without difficulty. Psychiatric this patient is able to make decisions and demonstrates good insight into disease process. Alert and Oriented x 3. pleasant and cooperative. Notes Upon inspection patient's wound bed actually showed signs of good granulation and epithelization at this point. Fortunately I do not see any signs of worsening overall and I do believe that the patient is making good headway towards complete closure. No fevers, chills, nausea, vomiting, or diarrhea. Electronic Signature(s) Signed: 06/03/2023 10:34:21 AM By: Allen Derry PA-C Entered By: Allen Derry on 06/03/2023  07:34:21 -------------------------------------------------------------------------------- Physician Orders Details Patient Name: Date of Service: [name], [name] 06/03/2023 9:30 A M Medical Record Number: [id number] Patient Account Number: [id number] Date of Birth/Sex: Treating RN: 04/26/1951 (72 y.o. [name] Primary Care Provider: Charlott Rakes Other Clinician: Referring Provider: Treating Provider/Extender: Larina Bras  III, Morley Kos, [place] Weeks in Treatment: [phone] Verbal / Phone Orders: No Diagnosis Coding Follow-up Appointments Return appointment in 1 month. Junious Silk, PA on Wednesday 07/01/23 at 9:30am Room 8 Anesthetic (In clinic) Topical Lidocaine 5% applied to wound bed Cellular or Tissue Based Products daptic or Mepitel. (DO NOT REMOVE). - Cellular or Tissue Based Product applied to wound bed, secured with steri-strips, cover with A Already Done- Epicord #1 06/11/22, #2 06/18/22, #3 06/25/22, #4 07/02/22, #5 07/09/22, #6 07/16/22, Epicord #7 07/23/22, Epicord #8 07/30/22, Epicord #9 08/06/2022. Epicord # 10 08/13/22 - no more after 10th application, per PA we will see how she does after this with collagen for now. Additional Orders / Instructions Follow Nutritious Diet Wound Treatment Wound #1 - Breast (mastectomy site) Wound Laterality: Right Cleanser: Wound Cleanser (Generic) Every Other Day/30 Days Discharge Instructions: Cleanse the wound with wound cleanser prior to applying a clean dressing using gauze sponges, not tissue or cotton balls. Cleanser: [place]ram Ancillary Kit - 15 Day Supply (Generic) Every Other Day/30 Days Discharge Instructions: Use supplies as instructed; Kit contains: (15) Saline Bullets; (15) 3x3 Gauze; 15 pr Gloves Cleanser: Soap and Water Every Other Day/30 Days Discharge Instructions: May shower and wash wound with dial antibacterial soap and water prior to dressing change. Peri-Wound Care: Skin Prep (Generic) Every Other Day/30 Days Discharge  Instructions: Use skin prep as directed Prim Dressing: Hydrofera Blue Ready Transfer Foam, 2.5x2.5 (in/in) (Dispense As Written) Every Other Day/30 Days ary Discharge Instructions: Apply directly to wound bed as directed Secondary Dressing: Woven Gauze Sponge, Non-Sterile 4x4 in (Generic) Every Other Day/30 Days Discharge Instructions: Apply over primary dressing as directed. Secondary Dressing: Woven Gauze Sponges 2x2 in (Generic) Every Other Day/30 Days Discharge Instructions: Apply over primary dressing, fold in corners to fill the space Secondary Dressing: Zetuvit Plus Silicone Border Dressing 5x5 (in/in) (Generic) Every Other Day/30 Days Discharge Instructions: Apply silicone border over primary dressing as directed. [name], [name] ([id number]) [id number] Page 6 of 11 Electronic Signature(s) Signed: 06/03/2023 12:32:29 PM By: Karie Schwalbe RN Signed: 06/03/2023 5:16:52 PM By: Allen Derry PA-C Entered By: Karie Schwalbe on 06/03/2023 07:03:06 -------------------------------------------------------------------------------- Problem List Details Patient Name: Date of Service: [name], [name] 06/03/2023 9:30 A M Medical Record Number: [id number] Patient Account Number: [id number] Date of Birth/Sex: Treating RN: 03-16-1951 (72 y.o. F) Primary Care Provider: Charlott Rakes Other Clinician: Referring Provider: Treating Provider/Extender: Roxanna Mew, Christiana Fuchs in Treatment: [phone] Active Problems ICD-10 Encounter Code Description Active Date MDM Diagnosis L98.492 Non-pressure chronic ulcer of skin of other sites with fat layer exposed 08/22/2020 No Yes L58.9 Radiodermatitis, unspecified 08/22/2020 No Yes L59.8 Other specified disorders of the skin and subcutaneous tissue related to 08/22/2020 No Yes radiation I10 Essential (primary) hypertension 08/22/2020 No Yes Inactive Problems Resolved Problems Electronic Signature(s) Signed: 06/03/2023  10:06:51 AM By: Allen Derry PA-C Entered By: Allen Derry on 06/03/2023 07:06:51 -------------------------------------------------------------------------------- Progress Note Details Patient Name: Date of Service: [name], [name] 06/03/2023 9:30 A M Medical Record Number: [id number] Patient Account Number: [id number] Date of Birth/Sex: Treating RN: April 04, 1951 (72 y.o. 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 She had a mastectomy initially in 1998. She had chemotherapy only no radiation following. In 2002 she subsequently did undergo radiation for the first time and then subsequently in 2012 had repeat radiation after having had a finding that was consistent with a relapse of the cancer. 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 She also has radiation damage to the skin over the right chest wall where she had the mastectomy. She was referred to [place] by Dr. Marcelle Overlie in [facility] and her oncologist is Dr. Arlan Organ. Subsequently I want to make sure before we delve into this more deeply that the patient does not have any issues with a return of cancer that needs to be managed by them. 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 Nonetheless she does not remember exactly when her last PET scan was but it  was not too recently she tells me. 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 The patient does have hypertension as well. She sees her oncologist next on December 6. 09/12/2020 on evaluation today patient actually appears to be doing excellent in regard to her wound under the right breast location. Currently this is measuring smaller in general seems to be doing very well. She tells me that the collagen is doing a good job and that the dressing that she has been putting on is not causing any troubles as far pulling on her skin or scar tissue is concerned all of which is excellent news. 10/03/2020 upon evaluation today patient appears to be doing well with regard to her surgical site from her mastectomy on the right breast region. She tells me that she has had very little bleeding nothing seems to be sticking badly and in general she has been extremely pleased with where things stand today. No fevers, chills, nausea, vomiting, or diarrhea. 10/24/2020 upon evaluation today patient actually appears to be doing excellent in regard to her wound. There is just a very small area still open and to be honest there was minimal slough noted on the surface of the wound nothing that requires sharp debridement. In general I feel like she is actually doing quite well and overall I am pleased. I do think we may switch to silver alginate dressing and also contemplating using a AandE ointment in order to help keep everything moist and the scar tissue region while the alginate keeps it dry enough to heal 11/14/2020 upon evaluation today patient appears to be doing well at this point in regard to her wound. I do feel like that she is making good progress again this is a very difficult region over the surgical site of the right chest wall at the site of mastectomy. Nonetheless I think that we are just a very small area still open I think alginate is doing a good job helping to dry this up and I would recommend  this such that we continue with this. 01/02/2021 on evaluation today patient's wound actually appears to be doing decently well. There does not appear to be any signs of active infection which is great news. She does have some slight slough buildup with biofilm on the surface of the wound mainly more biofilm. Nonetheless I was able to gently remove this with saline and gauze as well as a sterile  Q-tip. She tolerated that today without complication. 01/30/2021 upon evaluation today patient appears to be doing well with regard to her wound although she still has quite a bit of trouble getting this to close I think the scar tissue and radiation damage is quite significant here unfortunately. There does not appear to be any signs of active infection which is great news. No fevers, chills, nausea, vomiting, or diarrhea. 02/27/2021 upon evaluation today patient appears to be doing excellent in regard to her wound. She has been tolerating the dressing changes without complication and in general I am extremely pleased with where things stand today. There does not appear to be any signs of infection which is great news. No fevers, chills, nausea, vomiting, or diarrhea. With that being said I do think that she still developing some slough buildup. I did discuss with her today the possibility of proceeding with HBO therapy. We have had this discussion before but she really is not quite committed to wanting to do that much and spend that much time in the chamber. She wants to still think about it. 03/27/2021 upon evaluation today patient appears to be doing about the same in regard to her wound. She still developing a lot of slough buildup on the surface of the wound. I did try to clean that away to some degree today. She tolerated that without any pain or complication. Subsequently I am thinking we may want to switch over to [facility] see if this may do better for her. Patient is in agreement with giving that a  trial. 05/01/2021 upon evaluation today patient appears to be doing about the same in regard to her wounds. She has been tolerating the dressing changes without complication. Fortunately there does not appear to be any signs of active infection at this time which is great news. No fevers, chills, nausea, vomiting, or diarrhea.. 06/05/2021 upon evaluation today patient appears to be doing pretty well currently in regard to her wound at the mastectomy site right chest wall. Overall since we switch back to the alginate she tells me things have significantly improved she is much happier with where things stand currently. Fortunately there does not appear to be any signs of active infection which is great news. No fevers, chills, nausea, vomiting, or diarrhea. 07/10/2021 upon evaluation today patient unfortunately does not appear to be doing nearly as well as what she previously was. There does not appear to be any evidence of new epithelial growth she has a lot of necrotic tissue the base of the wound this is much more significant than what we previously noted. She also has been having increased pain and increased drainage. In general I am very concerned about this especially in light of the fact that she does have a history of breast cancer I want to ensure that were not looking at any cancerous type lesion at this point. Otherwise also think she does need some debridement we did do MolecuLight scanning today. 07/17/2021 upon evaluation today patient appears to be doing well with regard to her wound compared to last week although she still having some erythema I am concerned in this regard. I do think that she fortunately had a negative biopsy which is great news unfortunately she did have Pseudomonas noted as a bacteria present in the culture which I think is good and need to be addressed with Levaquin. I Minna send that into the pharmacy today. We did repeat the MolecuLight screening. 07/31/2021 upon  evaluation today patient's wound is  actually showing signs of improvement which is great news. Fortunately there does not appear to be any signs of infection currently locally nor systemically and I am very pleased in that regard. With that being said the patient is having some issues here with continued necrotic tissue I think packing with the Dakin's moistened gauze dressing is still in the right leg ago. 08/14/2021 upon evaluation today patient appears to be doing well with regard to her wound all things considered I do not see any signs of significant infection which is great news. Fortunately there does not appear to be any evidence of infection which is also great news. In general I think that the patient is tolerating the dressing changes well. We been using a Dakin's moistened gauze. 08/28/2021 upon evaluation today patient appears to be doing well with regard to her wound. Worsening signs of definite improvement which is great news and overall very pleased with where we stand today. There is no signs of inflamed tissue or infection which is great as well and I think the Dakin's is doing a great job here. 09/11/2021 upon evaluation today patient appears to be doing well with regard to her wound all things considered. I am can perform some debridement today to clear away some of the necrotic debris with that being said overall I think that she is making decently good progress here which is great news. There does not appear to be any signs of active infection also good news. That in fact is probably the best news in her mind. 09/25/2021 upon evaluation today patient appears to be doing decently well in regard to her wound. Overall I do not see any signs of infection and I think she is doing quite well. I do believe that we are making headway here although is very slow due to the scarring and the depth of the wound this is a significantly deep wound. 10/16/2021 upon evaluation today patient appears  to be doing about the same in regard to the wound on her right chest wall. Fortunately there does not appear to [place], [name] ([id number]) [id number] Page 8 of 11 be any signs of active infection locally nor systemically which is great news. With that being said this still was not cleaning up quite as quickly as I would like to see. Fortunately I think that we can definitely give the Santyl another try we tried this in the past and unfortunately did not have a good result but again I think she was infected at that time as well which was part of the issue. Nonetheless I think currently that we will be able to go ahead and see what we can do about getting this little cleaner with the Santyl. 11/06/2021 upon evaluation today patient appears to be doing well with regard to her wound. This actually appears to be a little bit better with the Santyl I am happy in that regard. Fortunately I do not see any signs of active infection at this time. No fevers, chills, nausea, vomiting, or diarrhea. 12/04/2021 upon evaluation patient appears to be doing well with the Santyl at this point. I am very pleased with where we stand and I think that she is making good progress here. Fortunately I do not see any evidence of active infection locally or systemically at this time which is great news. No fevers, chills, nausea, vomiting, or diarrhea. 01/01/2022 upon evaluation today patient actually is making excellent progress with the Santyl. I am extremely pleased with where we stand  today. I do not see any signs of infection. 01-29-2022 upon evaluation today patient appears to be doing well with regard to her wound. This is showing signs of some improvement. It is a little less deep than what it was. Size wise is also slightly smaller but again there still is a significant wound in this area. Fortunately I do not see any evidence of infection and she is doing quite well. 02-26-2022 upon evaluation  today patient appears to be doing well with regard to her wound. In fact this is actually the best that I have seen in a very long time. I do not see any evidence of active infection at this time locally or systemically which is great news and overall I think we are on the right track. Nonetheless I do believe that the patient is continuing to show evidence of improvement with the Santyl week by week. 03-26-2022 upon evaluation today patient appears to be doing well with regard to the wound on her right mastectomy site. She is actually showing signs of excellent improvement in fact there was some bleeding just with a little bit of light cleaning over the area. Fortunately I do not see any signs of active infection locally or systemically at this time which is great news. 04-30-2022 upon evaluation today patient appears to be doing well currently in regard to her wound. She is actually showing some signs of improvement here which is great news. Still this is very very slow. Subsequently I do believe that she may benefit from the use of [place]stone topical antibiotics for short amount of time before applying a skin substitute I think EpiCord could be doing very well for her as far as that is concerned. I discussed that with her today. Fortunately I do not see any evidence of active infection locally or systemically at this time which is great news. No fevers, chills, nausea, vomiting, or diarrhea. 05-28-2022 upon evaluation today patient's wound is actually showing signs of significant improvement. Fortunately I do not see any evidence of infection at this time which is great news. No fevers, chills, nausea, vomiting, or diarrhea. With that being said I do believe that the patient is tolerating the dressing changes without complication. We been using the [facility] which I think is helpful. We do have approval for the EpiCord. 06-11-2022 upon evaluation today patient appears to be doing well with regard to her  wound she is actually here today for the first application of EpiCord which I think is good to be beneficial for her. I do think that we will probably be able to keep this in place without can be the one thing of question to be honest. 06-18-2022 upon evaluation today patient actually appears to be doing excellent today. She has 1 treatment of the EpiCord underway and the wound surface already looks much better. This will be EpiCord #2 today. Fortunately I think we are on the right track here. 06-25-2022 upon evaluation today patient appears to be making good progress and overall the patient seems to have tolerated EpiCord without any complication. I do feel like that the wound is improving quite significantly. This is EpiCord #3 today. 07-02-2021 upon evaluation today patient's wound is actually showing signs of improvement. I am actually very pleased with where we stand we have been seeing some improvements in size overall and I think that we are still in the right track although there is a little bit of need for sharp debridement today to clearway some of  the necrotic debris and remaining EpiCord which is actually still somewhat adhered to the wound bed. Overall I am extremely pleased though with where we stand. This is EpiCord #4 today. Upon evaluation today patient appears to be doing well with regard to her wound the EpiCord is definitely helping and does seem to be improving the overall status of the wound the surface is dramatically improved compared to what it was at the start of this. The tissue is actually becoming more red than it is just a pale pink and to be honest some of the did not even appear to be pink in the start. Nonetheless I am very pleased with where we stand currently. No fevers, chills, nausea, vomiting, or diarrhea. 07-16-2022 upon evaluation today patient appears to be doing well currently in regard to her wound she is doing well with the EpiCord this is application #6  today. 07-23-2022 upon evaluation today patient's wound actually showing signs of excellent improvement which is great news. Fortunately I think were making progress again this is a very scarred area which now has a pretty good vascular supply which is great news. Overall I think that she is making great progress. This is EpiCord #7 today 10/18; Epicort No. 8. Very minimally smaller. Wounds on the right breast mastectomy site complicated by soft tissue radiation damage. Unfortunately she lives in [place], 5 days a week transport would be impossible for her to arrange 08-06-2022 upon evaluation today patient's wound is showing signs again of minimal improvement with regard to the wound bed again this is something that would probably respond well to hyperbaric oxygen therapy and I think should we can probably get this approved but she is just not able to make the transportation from [place] here daily for the 65-month period of time. Nonetheless I do think that she seems to be doing much better with regard to the surface of the wound and I am pleased that regard I am going to go ahead and continue with the EpiCord this is #9 application today. 08-13-2022 upon evaluation today patient's wound again is showing signs of improved quality to the tissue in the base of the wound. Fortunately I do not see any evidence of infection which is great news and overall I am extremely pleased in that regard. In general I do believe that the patient is making progress here. No fevers, chills, nausea, vomiting, or diarrhea. She is here today for EpiCord application #10 11/8; patient with a wound on the lower part of the right breast area. She is apparently had a nice response to epi cord but she has completed this. It is difficult not to look over this lady's history and look at the wound and wonder about the known benefits of hyperbaric oxygen for soft tissue radionecrosis however she is unable to make transportation  arrangements. She lives in [place] 08-27-2022 upon evaluation today patient appears to be doing well in regard to her wound. She has been tolerating the dressing changes without complication wheezing using the silver collagen over the past week and she tolerated that without complication. Fortunately I see no evidence of active infection locally or systemically at this time. 09-17-2022 upon evaluation today patient appears to be doing well currently in regard to her wound. She has been tolerating the dressing changes without complication. Fortunately there does not appear to be any signs of active infection locally or systemically at this time which is great news. No fevers, chills, nausea, vomiting, or diarrhea. 10-15-2022 upon  evaluation today patient appears to be doing well currently in regard to her wound. This is showing signs of good improvement which is great news and overall I am extremely pleased with where we stand today. Fortunately there does not appear to be any signs of active infection at this time which is great news as well. No fevers, chills, nausea, vomiting, or diarrhea. 11-12-2022 upon evaluation today patient appears to be doing pretty well in regard to her wound. She has been tolerating the dressing changes without complication and fortunately there does not appear to be any signs of active infection locally nor systemically which is great news. No fevers, chills, nausea, [name], [name] ([id number]) [phone].pdf Page 9 of 11 vomiting, or diarrhea. 12-10-2022 upon evaluation today patient appears to be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

## 2023-06-11 DIAGNOSIS — H25813 Combined forms of age-related cataract, bilateral: Secondary | ICD-10-CM | POA: Diagnosis not present

## 2023-06-11 DIAGNOSIS — H524 Presbyopia: Secondary | ICD-10-CM | POA: Diagnosis not present

## 2023-07-01 ENCOUNTER — Encounter (HOSPITAL_BASED_OUTPATIENT_CLINIC_OR_DEPARTMENT_OTHER): Payer: Medicare Other | Attending: Physician Assistant | Admitting: Physician Assistant

## 2023-07-01 DIAGNOSIS — I10 Essential (primary) hypertension: Secondary | ICD-10-CM | POA: Diagnosis not present

## 2023-07-01 DIAGNOSIS — Z853 Personal history of malignant neoplasm of breast: Secondary | ICD-10-CM | POA: Diagnosis not present

## 2023-07-01 DIAGNOSIS — I87331 Chronic venous hypertension (idiopathic) with ulcer and inflammation of right lower extremity: Secondary | ICD-10-CM | POA: Diagnosis not present

## 2023-07-01 DIAGNOSIS — L97311 Non-pressure chronic ulcer of right ankle limited to breakdown of skin: Secondary | ICD-10-CM | POA: Diagnosis not present

## 2023-07-01 DIAGNOSIS — L98492 Non-pressure chronic ulcer of skin of other sites with fat layer exposed: Secondary | ICD-10-CM | POA: Insufficient documentation

## 2023-07-01 DIAGNOSIS — Z9011 Acquired absence of right breast and nipple: Secondary | ICD-10-CM | POA: Diagnosis not present

## 2023-07-01 DIAGNOSIS — L589 Radiodermatitis, unspecified: Secondary | ICD-10-CM | POA: Diagnosis not present

## 2023-07-01 DIAGNOSIS — Z9221 Personal history of antineoplastic chemotherapy: Secondary | ICD-10-CM | POA: Diagnosis not present

## 2023-07-01 DIAGNOSIS — L598 Other specified disorders of the skin and subcutaneous tissue related to radiation: Secondary | ICD-10-CM | POA: Diagnosis not present

## 2023-07-01 NOTE — Progress Notes (Signed)
IZUMI, CUADRADO ([id number]) [id number] Page 1 of 2 Visit Report for 07/01/2023 Chief Complaint Document Details Patient Name: Date of Service: [name], [name] 07/01/2023 9:30 A M Medical Record Number: [id number] Patient Account Number: [id number] Date of Birth/Sex: Treating RN: 1951/07/18 (72 y.o. F) Primary Care Provider: Charlott Rakes Other Clinician: Referring Provider: Treating Provider/Extender: Roxanna Mew, [name]: [phone] Information Obtained from: Patient Chief Complaint Right Breast soft tissue radionecrosis following mastectomy Electronic Signature(s) Signed: 07/01/2023 9:26:23 AM By: Allen Derry PA-C Entered By: Allen Derry on 07/01/2023 57:84:69 -------------------------------------------------------------------------------- Problem List Details Patient Name: Date of Service: [name], [name] 07/01/2023 9:30 A M Medical Record Number: [id number] Patient Account Number: [id number] Date of Birth/Sex: Treating RN: [birth year] (72 y.o. F) Primary Care Provider: Charlott Rakes Other Clinician: Referring Provider: Treating Provider/Extender: Roxanna Mew, Para March Weeks in Treatment: [phone] Active Problems ICD-10 Encounter Code Description Active Date MDM Diagnosis L98.492 Non-pressure chronic ulcer of skin of other sites with fat layer 08/22/2020 No Yes exposed L58.9 Radiodermatitis, unspecified 08/22/2020 No Yes L59.8 Other specified disorders of the skin and subcutaneous tissue 08/22/2020 No Yes related to radiation I10 Essential (primary) hypertension 08/22/2020 No Yes Inactive Problems Resolved Problems EMOREE, TOLERICO ([id number]) [id number] Page 2 of 2 Electronic Signature(s) Signed: 07/01/2023 9:26:16 AM By: Allen Derry PA-C Entered By: Allen Derry on 07/01/2023 06:26:16

## 2023-07-03 NOTE — Progress Notes (Signed)
Clinician: Referring [name]: Treating [name]/Extender: Roxanna Mew, [name]: 149 Edema Assessment Assessed: [Left: No] [Right: Yes] Edema: [Left: Ye] [Right: s] Calf Left: Right: Point of Measurement: 32 cm From Medial Instep 45 cm Ankle Left: Right: Point of Measurement: 9 cm From Medial Instep 25 cm Knee To Floor Left: Right: From Medial Instep 39 cm Vascular Assessment Pulses: Dorsalis Pedis Palpable: [Right:Yes] Posterior Tibial Palpable: [Right:Yes] Extremity colors, hair growth, and conditions: Extremity Color: [Right:Hyperpigmented] Hair Growth on Extremity: [Right:No] Temperature of Extremity: [Right:Warm] Capillary Refill: [Right:< 3 seconds] Dependent Rubor: [Right:No] Blanched when Elevated: [Right:No] Lipodermatosclerosis: [Right:Yes] Blood Pressure: Brachial: [Right:164] Ankle: [Right:Dorsalis Pedis: 136 0.83] Toe Nail Assessment Left: Right: Thick: No Discolored: No Deformed: No Improper Length and Hygiene: No Electronic Signature(s) Signed: 07/01/2023 5:41:52 PM By: Shawn Stall RN, BSN Entered By: Shawn Stall on 07/01/2023 07:30:28 Multi-Disciplinary Care Plan Details -------------------------------------------------------------------------------- [name] ([id number]) [id number] Page 5 of 10 Patient Name: Date of Service: [name], [name] 07/01/2023 9:30 A M Medical Record Number:  [id number] Patient Account Number: [id number] Date of Birth/Sex: Treating RN: November 26, 1950 (72 y.o. [name] Primary Care Bailey Faiella: Charlott Rakes Other Clinician: Referring Tyree Vandruff: Treating Sebert Stollings/Extender: Roxanna Mew, Christiana Fuchs in Treatment: 149 Multidisciplinary Care Plan reviewed with physician Active Inactive Wound/Skin Impairment Nursing Diagnoses: Impaired tissue integrity Knowledge deficit related to ulceration/compromised skin integrity Goals: Patient/caregiver will verbalize understanding of skin care regimen Date Initiated: 08/22/2020 Target Resolution Date: 08/12/2023 Goal Status: Active Ulcer/skin breakdown will have a volume reduction of 30% by week 4 Date Initiated: 08/22/2020 Date Inactivated: 10/03/2020 Target Resolution Date: 09/19/2020 Goal Status: Met Ulcer/skin breakdown will have a volume reduction of 50% by week 8 Date Initiated: 06/05/2021 Date Inactivated: 07/17/2021 Target Resolution Date: 07/03/2021 Goal Status: Unmet Unmet Reason: infection Interventions: Assess patient/caregiver ability to obtain necessary supplies Assess patient/caregiver ability to perform ulcer/skin care regimen upon admission and as needed Assess ulceration(s) every visit Treatment Activities: Skin care regimen initiated : 08/22/2020 Topical wound management initiated : 08/22/2020 Notes: 06/05/21: Wound care regimen ongoing. 06/11/22: Wound care regimen continues, applying skin sub (Epicord) Electronic Signature(s) Signed: 07/01/2023 5:41:52 PM By: Shawn Stall RN, BSN Entered By: Shawn Stall on 07/01/2023 07:15:40 -------------------------------------------------------------------------------- Non-Wound Condition Assessment Details Patient Name: Date of Service: [name], [name] 07/01/2023 9:30 A M Medical Record Number: [id number] Patient Account Number: [id number] Date of Birth/Sex: Treating RN: 04-19-1951 (72 y.o. [name] Primary Care  Ansley Mangiapane: Charlott Rakes Other Clinician: Referring Brinnley Lacap: Treating Amor Hyle/Extender: Roxanna Mew, [name]: 149 Non-Wound Condition: Condition: Lymphedema Location: Leg Side: Right Periwound Skin Texture Texture Color No Abnormalities Noted: No No Abnormalities Noted: No Callus: No Atrophie Blanche: No Crepitus: No Cyanosis: No Excoriation: No Ecchymosis: No Friable: No Erythema: No Induration: No Hemosiderin StainingBEYLA, [name] ([id number]) [phone].pdf Page 6 of 10 Rash: No Mottled: No Scarring: No Pallor: No Rubor: No Moisture No Abnormalities Noted: No Temperature / Pain Dry / Scaly: Yes Temperature: No Abnormality Maceration: No Notes edema noted to right foot and leg, weeping to right medical area. Electronic Signature(s) Signed: 07/01/2023 5:41:52 PM By: Shawn Stall RN, BSN Entered By: Shawn Stall on 07/01/2023 07:15:25 -------------------------------------------------------------------------------- Pain Assessment Details Patient Name: Date of Service: [name], [name] 07/01/2023 9:30 A M Medical Record Number: [id number] Patient Account Number: [id number] Date of Birth/Sex: Treating RN: 24-May-1951 (72 y.o. F) Primary Care Vineet Kinney: Charlott Rakes Other Clinician: Referring Jahden Schara: Treating Dodi Leu/Extender: Roxanna Mew, [name]:  Clinician: Referring [name]: Treating [name]/Extender: Roxanna Mew, [name]: 149 Edema Assessment Assessed: [Left: No] [Right: Yes] Edema: [Left: Ye] [Right: s] Calf Left: Right: Point of Measurement: 32 cm From Medial Instep 45 cm Ankle Left: Right: Point of Measurement: 9 cm From Medial Instep 25 cm Knee To Floor Left: Right: From Medial Instep 39 cm Vascular Assessment Pulses: Dorsalis Pedis Palpable: [Right:Yes] Posterior Tibial Palpable: [Right:Yes] Extremity colors, hair growth, and conditions: Extremity Color: [Right:Hyperpigmented] Hair Growth on Extremity: [Right:No] Temperature of Extremity: [Right:Warm] Capillary Refill: [Right:< 3 seconds] Dependent Rubor: [Right:No] Blanched when Elevated: [Right:No] Lipodermatosclerosis: [Right:Yes] Blood Pressure: Brachial: [Right:164] Ankle: [Right:Dorsalis Pedis: 136 0.83] Toe Nail Assessment Left: Right: Thick: No Discolored: No Deformed: No Improper Length and Hygiene: No Electronic Signature(s) Signed: 07/01/2023 5:41:52 PM By: Shawn Stall RN, BSN Entered By: Shawn Stall on 07/01/2023 07:30:28 Multi-Disciplinary Care Plan Details -------------------------------------------------------------------------------- [name] ([id number]) [id number] Page 5 of 10 Patient Name: Date of Service: [name], [name] 07/01/2023 9:30 A M Medical Record Number:  [id number] Patient Account Number: [id number] Date of Birth/Sex: Treating RN: November 26, 1950 (72 y.o. [name] Primary Care Bailey Faiella: Charlott Rakes Other Clinician: Referring Tyree Vandruff: Treating Sebert Stollings/Extender: Roxanna Mew, Christiana Fuchs in Treatment: 149 Multidisciplinary Care Plan reviewed with physician Active Inactive Wound/Skin Impairment Nursing Diagnoses: Impaired tissue integrity Knowledge deficit related to ulceration/compromised skin integrity Goals: Patient/caregiver will verbalize understanding of skin care regimen Date Initiated: 08/22/2020 Target Resolution Date: 08/12/2023 Goal Status: Active Ulcer/skin breakdown will have a volume reduction of 30% by week 4 Date Initiated: 08/22/2020 Date Inactivated: 10/03/2020 Target Resolution Date: 09/19/2020 Goal Status: Met Ulcer/skin breakdown will have a volume reduction of 50% by week 8 Date Initiated: 06/05/2021 Date Inactivated: 07/17/2021 Target Resolution Date: 07/03/2021 Goal Status: Unmet Unmet Reason: infection Interventions: Assess patient/caregiver ability to obtain necessary supplies Assess patient/caregiver ability to perform ulcer/skin care regimen upon admission and as needed Assess ulceration(s) every visit Treatment Activities: Skin care regimen initiated : 08/22/2020 Topical wound management initiated : 08/22/2020 Notes: 06/05/21: Wound care regimen ongoing. 06/11/22: Wound care regimen continues, applying skin sub (Epicord) Electronic Signature(s) Signed: 07/01/2023 5:41:52 PM By: Shawn Stall RN, BSN Entered By: Shawn Stall on 07/01/2023 07:15:40 -------------------------------------------------------------------------------- Non-Wound Condition Assessment Details Patient Name: Date of Service: [name], [name] 07/01/2023 9:30 A M Medical Record Number: [id number] Patient Account Number: [id number] Date of Birth/Sex: Treating RN: 04-19-1951 (72 y.o. [name] Primary Care  Ansley Mangiapane: Charlott Rakes Other Clinician: Referring Brinnley Lacap: Treating Amor Hyle/Extender: Roxanna Mew, [name]: 149 Non-Wound Condition: Condition: Lymphedema Location: Leg Side: Right Periwound Skin Texture Texture Color No Abnormalities Noted: No No Abnormalities Noted: No Callus: No Atrophie Blanche: No Crepitus: No Cyanosis: No Excoriation: No Ecchymosis: No Friable: No Erythema: No Induration: No Hemosiderin StainingBEYLA, [name] ([id number]) [phone].pdf Page 6 of 10 Rash: No Mottled: No Scarring: No Pallor: No Rubor: No Moisture No Abnormalities Noted: No Temperature / Pain Dry / Scaly: Yes Temperature: No Abnormality Maceration: No Notes edema noted to right foot and leg, weeping to right medical area. Electronic Signature(s) Signed: 07/01/2023 5:41:52 PM By: Shawn Stall RN, BSN Entered By: Shawn Stall on 07/01/2023 07:15:25 -------------------------------------------------------------------------------- Pain Assessment Details Patient Name: Date of Service: [name], [name] 07/01/2023 9:30 A M Medical Record Number: [id number] Patient Account Number: [id number] Date of Birth/Sex: Treating RN: 24-May-1951 (72 y.o. F) Primary Care Vineet Kinney: Charlott Rakes Other Clinician: Referring Jahden Schara: Treating Dodi Leu/Extender: Roxanna Mew, [name]:  10 -------------------------------------------------------------------------------- Wound Assessment Details Patient Name: Date of Service: [name], [name] 07/01/2023 9:30 A M Medical Record Number: [id number] Patient Account Number: [id number] Date of Birth/Sex: Treating RN: [birth year] (72 y.o. [name], [name] Primary Care Jaime Dome: Charlott Rakes Other Clinician: Referring Iyah Laguna: Treating Elberta Lachapelle/Extender: Roxanna Mew, [name]: 149 Wound Status Wound Number: 2 Primary Venous Leg Ulcer Etiology: Wound Location: Right, Medial Lower Leg Secondary Lymphedema Wounding Event: Blister Etiology: Date Acquired: 05/31/2023 Wound Open Weeks Of Treatment: 0 Status: Clustered Wound: No Comorbid Anemia, Lymphedema, Deep Vein Thrombosis, Hypertension, History: Peripheral Venous Disease, Osteoarthritis, Received Chemotherapy, Received Radiation, Confinement Anxiety Photos Wound Measurements Length: (cm) 4 Width: (cm) 3 Depth: (cm) 0.1 Area: (cm) 9.425 Volume: (cm) 0.942 % Reduction in Area: % Reduction in Volume: Epithelialization: None Tunneling: No Undermining: No Wound Description Classification: Full Thickness Without Exposed Suppor Wound Margin: Distinct, outline attached Exudate Amount: Medium Exudate Type: Serous Exudate Color: amber t Structures Foul Odor After Cleansing: No Slough/Fibrino No Wound Bed Granulation Amount: Large (67-100%) Exposed Structure Granulation Quality: Pink, Pale Fascia Exposed: No Necrotic Amount: None Present (0%) Fat Layer (Subcutaneous Tissue) Exposed: Yes Tendon Exposed: No Muscle Exposed: No Joint Exposed: No Bone Exposed:  No Periwound Skin Texture Texture Color No Abnormalities Noted: No No Abnormalities Noted: No Callus: No Atrophie Blanche: No Crepitus: No Cyanosis: No Excoriation: No Ecchymosis: No Induration: No Erythema: No Rash: No Hemosiderin Staining: Yes Scarring: No Mottled: No Pallor: No Moisture Rubor: No No Abnormalities Noted: No Dry / ScalyELISSA, LEFEVERS ([id number]) [phone].pdf Page 10 of 10 Maceration: Yes Treatment Notes Wound #2 (Lower Leg) Wound Laterality: Right, Medial Cleanser Vashe 5.8 (oz) Discharge Instruction: Cleanse the wound with Vashe prior to applying a clean dressing using gauze sponges, not tissue or cotton balls. Peri-Wound Care Sween Lotion (Moisturizing lotion) Discharge Instruction: Apply moisturizing lotion as directed Topical Triamcinolone Discharge Instruction: Apply Triamcinolone as directed Primary Dressing Aquacel Ag Discharge Instruction: apply directly to wound bed. Secondary Dressing ABD Pad, 8x10 Discharge Instruction: Apply over primary dressing as directed. Secured With Compression Wrap Urgo K2 Lite, (equivalent to a 3 layer) two layer compression system, regular Discharge Instruction: ******APPLY FIRST LAYER OF UNNA BOOT TO UPPER PORTION OF LOWER LEG.*****Apply Urgo K2 Lite as directed (alternative to 3 layer compression). Compression Stockings Add-Ons Electronic Signature(s) Signed: 07/01/2023 5:41:52 PM By: Shawn Stall RN, BSN Signed: 07/03/2023 12:46:19 PM By: Hunt Oris Entered By: Hunt Oris on 07/01/2023 07:24:42 -------------------------------------------------------------------------------- Vitals Details Patient Name: Date of Service: [name], [name] 07/01/2023 9:30 A M Medical Record Number: [id number] Patient Account Number: [id number] Date of Birth/Sex: Treating RN: 1951/01/26 (72 y.o. F) Primary Care Ashtin Melichar: Charlott Rakes Other Clinician: Referring Laurence Folz: Treating  Truda Staub/Extender: Roxanna Mew, [name]: 149 Vital Signs Time Taken: 09:32 Temperature (F): 98.4 Height (in): 63 Pulse (bpm): 69 Weight (lbs): 176 Respiratory Rate (breaths/min): 18 Body Mass Index (BMI): 31.2 Blood Pressure (mmHg): 173/74 Reference Range: 80 - 120 mg / dl Notes took BP meds late Electronic Signature(s) Signed: 07/03/2023 12:48:07 PM By: Thayer Dallas Entered By: Thayer Dallas on 07/01/2023 06:32:53  Isolation Patient Management []  - 0 Hearing / Language / Visual special needs []  - 0 Assessment of Community assistance (transportation, D/C planning, etc.) []  - 0 Additional assistance / Altered mentation []  - 0 Support Surface(s) Assessment (bed, cushion, seat, etc.) INTERVENTIONS - Wound Cleansing / Measurement []  - Points for Wound Cleaning / Measurement, Wound Dressing, Specimen Collection and Specimen taken to lab can 0 only be taken for a new wound of unknown or different etiology and a procedure is NOT performed to that wound X- 1 5 Simple Wound Cleansing - one wound []  - 0 Complex Wound Cleansing - multiple wounds X- 1 5 Wound Imaging (photographs - any number of wounds) []  - 0 Wound Tracing (instead of photographs) X- 1 5 Simple Wound Measurement - one wound []  - 0 Complex Wound Measurement - multiple wounds INTERVENTIONS - Wound Dressings []  - 0 Small Wound Dressing one or multiple wounds []  - 0 Medium Wound Dressing one or multiple wounds X- 1 20 Large Wound Dressing one or multiple wounds INTERVENTIONS - Miscellaneous []  - 0 External ear exam []  - 0 Specimen Collection (cultures, biopsies, blood, body fluids, etc.) []  - 0 Specimen(s) / Culture(s) sent or taken to Lab for analysis []  - 0 Patient Transfer (multiple staff / Michiel Sites Lift / Similar devices) []  - 0 Simple Staple / Suture removal (25 or less) Danowski, Lucendia Herrlich ([id number]) [phone].pdf Page 3 of 10 []  - 0 Complex Staple / Suture removal (26 or more) []  - 0 Hypo / Hyperglycemic Management (close monitor of Blood Glucose) X- 1 15 Ankle / Brachial Index (ABI) - do not check if billed separately X- 1 5 Vital Signs Has the patient been seen at the hospital within the last three years: Yes Total Score:  130 Level Of Care: New/Established - Level 4 Electronic Signature(s) Signed: 07/01/2023 5:41:52 PM By: Shawn Stall RN, BSN Entered By: Shawn Stall on 07/01/2023 07:57:06 -------------------------------------------------------------------------------- Compression Therapy Details Patient Name: Date of Service: [name], [name] 07/01/2023 9:30 A M Medical Record Number: [id number] Patient Account Number: [id number] Date of Birth/Sex: Treating RN: [birth year] (72 y.o. [name] Primary Care Maegen Wigle: Charlott Rakes Other Clinician: Referring Zackarie Chason: Treating Sadarius Norman/Extender: Roxanna Mew, [place] Weeks in Treatment: 149 Compression Therapy Performed for Wound Assessment: Wound #2 Right,Medial Lower Leg Performed By: Clinician Shawn Stall, RN Compression Type: Double Layer Post Procedure Diagnosis Same as Pre-procedure Electronic Signature(s) Signed: 07/01/2023 5:41:52 PM By: Shawn Stall RN, BSN Entered By: Shawn Stall on 07/01/2023 07:31:09 -------------------------------------------------------------------------------- Encounter Discharge Information Details Patient Name: Date of Service: [name], [name] 07/01/2023 9:30 A M Medical Record Number: [id number] Patient Account Number: [id number] Date of Birth/Sex: Treating RN: 08-21-1951 (72 y.o. [name] Primary Care Michaline Kindig: Charlott Rakes Other Clinician: Referring Hulet Ehrmann: Treating Sorcha Rotunno/Extender: Roxanna Mew, [name]: [id number] Encounter Discharge Information Items Post Procedure Vitals Discharge Condition: Stable Temperature (F): 98.4 Ambulatory Status: Ambulatory Pulse (bpm): 69 Discharge Destination: Home Respiratory Rate (breaths/min): 18 Transportation: Private Auto Blood Pressure (mmHg): 173/74 Accompanied By: self Schedule Follow-up Appointment: Yes Clinical Summary of Care: Electronic Signature(s) Signed: 07/01/2023 5:41:52 PM By: Shawn Stall RN,  BSN Entered By: Shawn Stall on 07/01/2023 07:58:40 [name] ([id number]) [id number] Page 4 of 10 -------------------------------------------------------------------------------- Lower Extremity Assessment Details Patient Name: Date of Service: [name], [name] 07/01/2023 9:30 A M Medical Record Number: [id number] Patient Account Number: [id number] Date of Birth/Sex: Treating RN: Dec 02, 1950 (72 y.o. [name] Primary Care Ishitha Roper: Charlott Rakes Other  Clinician: Referring [name]: Treating [name]/Extender: Roxanna Mew, [name]: 149 Edema Assessment Assessed: [Left: No] [Right: Yes] Edema: [Left: Ye] [Right: s] Calf Left: Right: Point of Measurement: 32 cm From Medial Instep 45 cm Ankle Left: Right: Point of Measurement: 9 cm From Medial Instep 25 cm Knee To Floor Left: Right: From Medial Instep 39 cm Vascular Assessment Pulses: Dorsalis Pedis Palpable: [Right:Yes] Posterior Tibial Palpable: [Right:Yes] Extremity colors, hair growth, and conditions: Extremity Color: [Right:Hyperpigmented] Hair Growth on Extremity: [Right:No] Temperature of Extremity: [Right:Warm] Capillary Refill: [Right:< 3 seconds] Dependent Rubor: [Right:No] Blanched when Elevated: [Right:No] Lipodermatosclerosis: [Right:Yes] Blood Pressure: Brachial: [Right:164] Ankle: [Right:Dorsalis Pedis: 136 0.83] Toe Nail Assessment Left: Right: Thick: No Discolored: No Deformed: No Improper Length and Hygiene: No Electronic Signature(s) Signed: 07/01/2023 5:41:52 PM By: Shawn Stall RN, BSN Entered By: Shawn Stall on 07/01/2023 07:30:28 Multi-Disciplinary Care Plan Details -------------------------------------------------------------------------------- [name] ([id number]) [id number] Page 5 of 10 Patient Name: Date of Service: [name], [name] 07/01/2023 9:30 A M Medical Record Number:  [id number] Patient Account Number: [id number] Date of Birth/Sex: Treating RN: November 26, 1950 (72 y.o. [name] Primary Care Bailey Faiella: Charlott Rakes Other Clinician: Referring Tyree Vandruff: Treating Sebert Stollings/Extender: Roxanna Mew, Christiana Fuchs in Treatment: 149 Multidisciplinary Care Plan reviewed with physician Active Inactive Wound/Skin Impairment Nursing Diagnoses: Impaired tissue integrity Knowledge deficit related to ulceration/compromised skin integrity Goals: Patient/caregiver will verbalize understanding of skin care regimen Date Initiated: 08/22/2020 Target Resolution Date: 08/12/2023 Goal Status: Active Ulcer/skin breakdown will have a volume reduction of 30% by week 4 Date Initiated: 08/22/2020 Date Inactivated: 10/03/2020 Target Resolution Date: 09/19/2020 Goal Status: Met Ulcer/skin breakdown will have a volume reduction of 50% by week 8 Date Initiated: 06/05/2021 Date Inactivated: 07/17/2021 Target Resolution Date: 07/03/2021 Goal Status: Unmet Unmet Reason: infection Interventions: Assess patient/caregiver ability to obtain necessary supplies Assess patient/caregiver ability to perform ulcer/skin care regimen upon admission and as needed Assess ulceration(s) every visit Treatment Activities: Skin care regimen initiated : 08/22/2020 Topical wound management initiated : 08/22/2020 Notes: 06/05/21: Wound care regimen ongoing. 06/11/22: Wound care regimen continues, applying skin sub (Epicord) Electronic Signature(s) Signed: 07/01/2023 5:41:52 PM By: Shawn Stall RN, BSN Entered By: Shawn Stall on 07/01/2023 07:15:40 -------------------------------------------------------------------------------- Non-Wound Condition Assessment Details Patient Name: Date of Service: [name], [name] 07/01/2023 9:30 A M Medical Record Number: [id number] Patient Account Number: [id number] Date of Birth/Sex: Treating RN: 04-19-1951 (72 y.o. [name] Primary Care  Ansley Mangiapane: Charlott Rakes Other Clinician: Referring Brinnley Lacap: Treating Amor Hyle/Extender: Roxanna Mew, [name]: 149 Non-Wound Condition: Condition: Lymphedema Location: Leg Side: Right Periwound Skin Texture Texture Color No Abnormalities Noted: No No Abnormalities Noted: No Callus: No Atrophie Blanche: No Crepitus: No Cyanosis: No Excoriation: No Ecchymosis: No Friable: No Erythema: No Induration: No Hemosiderin StainingBEYLA, [name] ([id number]) [phone].pdf Page 6 of 10 Rash: No Mottled: No Scarring: No Pallor: No Rubor: No Moisture No Abnormalities Noted: No Temperature / Pain Dry / Scaly: Yes Temperature: No Abnormality Maceration: No Notes edema noted to right foot and leg, weeping to right medical area. Electronic Signature(s) Signed: 07/01/2023 5:41:52 PM By: Shawn Stall RN, BSN Entered By: Shawn Stall on 07/01/2023 07:15:25 -------------------------------------------------------------------------------- Pain Assessment Details Patient Name: Date of Service: [name], [name] 07/01/2023 9:30 A M Medical Record Number: [id number] Patient Account Number: [id number] Date of Birth/Sex: Treating RN: 24-May-1951 (72 y.o. F) Primary Care Vineet Kinney: Charlott Rakes Other Clinician: Referring Jahden Schara: Treating Dodi Leu/Extender: Roxanna Mew, [name]:

## 2023-07-08 ENCOUNTER — Encounter (HOSPITAL_BASED_OUTPATIENT_CLINIC_OR_DEPARTMENT_OTHER): Payer: Medicare Other | Admitting: Physician Assistant

## 2023-07-08 DIAGNOSIS — L98492 Non-pressure chronic ulcer of skin of other sites with fat layer exposed: Secondary | ICD-10-CM | POA: Diagnosis not present

## 2023-07-08 DIAGNOSIS — I10 Essential (primary) hypertension: Secondary | ICD-10-CM | POA: Diagnosis not present

## 2023-07-08 DIAGNOSIS — I87331 Chronic venous hypertension (idiopathic) with ulcer and inflammation of right lower extremity: Secondary | ICD-10-CM | POA: Diagnosis not present

## 2023-07-08 DIAGNOSIS — L97311 Non-pressure chronic ulcer of right ankle limited to breakdown of skin: Secondary | ICD-10-CM | POA: Diagnosis not present

## 2023-07-08 DIAGNOSIS — L589 Radiodermatitis, unspecified: Secondary | ICD-10-CM | POA: Diagnosis not present

## 2023-07-08 DIAGNOSIS — L598 Other specified disorders of the skin and subcutaneous tissue related to radiation: Secondary | ICD-10-CM | POA: Diagnosis not present

## 2023-07-08 NOTE — Progress Notes (Signed)
ANATHEA, PINERA ([id number]) [id number] Page 1 of 2 Visit Report for 07/08/2023 Chief Complaint Document Details Patient Name: Date of Service: [name], [name] 07/08/2023 9:00 A M Medical Record Number: [id number] Patient Account Number: [id number] Date of Birth/Sex: Treating RN: 26-Sep-1951 (72 y.o. 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 08/22/2020 No Yes exposed L58.9 Radiodermatitis, unspecified 08/22/2020 No Yes L59.8 Other specified disorders of the skin and subcutaneous tissue related 08/22/2020 No Yes to radiation I87.331 Chronic venous hypertension (idiopathic) with ulcer and inflammation 07/01/2023 No Yes of right lower extremity 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 07/01/2023 No Yes I10 Essential (primary) hypertension 08/22/2020 No Yes [name], [name] ([id number]) [phone].pdf Page 2 of  2 Inactive Problems Resolved Problems Electronic Signature(s) Signed: 07/08/2023 9:22:52 AM By: Allen Derry PA-C Entered By: Allen Derry on 07/08/2023 06:22:51

## 2023-07-09 NOTE — Progress Notes (Signed)
[name], [place] ([id number]) [id number] Page 1 of 8 Visit Report for 07/08/2023 Arrival Information Details Patient Name: Date of Service: [name], [place] YE 07/08/2023 9:00 A M Medical Record Number: [id number] Patient Account Number: [id number] Date of Birth/Sex: Treating RN: August 24, 1951 (72 y.o. [name], [name] Primary Care Bray Vickerman: Charlott Rakes Other Clinician: Referring Keshayla Schrum: Treating Zyshawn Bohnenkamp/Extender: Roxanna Mew, [name]: 150 Visit Information History Since Last Visit All ordered tests and consults were completed: No Patient Arrived: Ambulatory Added or deleted any medications: No Arrival Time: 08:55 Any new allergies or adverse reactions: No Accompanied By: self Had a fall or experienced change in No Transfer Assistance: None activities of daily living that may affect Patient Identification Verified: Yes risk of falls: Secondary Verification Process Completed: Yes Signs or symptoms of abuse/neglect since last visito No Patient Requires Transmission-Based Precautions: No Hospitalized since last visit: No Patient Has Alerts: No Implantable device outside of the clinic excluding No cellular tissue based products placed in the center since last visit: Has Dressing in Place as Prescribed: Yes Has Compression in Place as Prescribed: Yes Pain Present Now: Yes Electronic Signature(s) Signed: 07/09/2023 4:13:22 PM By: Hunt Oris Entered By: Hunt Oris on 07/08/2023 08:57:14 -------------------------------------------------------------------------------- Compression Therapy Details Patient Name: Date of Service: [name], [name] 07/08/2023 9:00 A M Medical Record Number: [id number] Patient Account Number: [id number] Date of Birth/Sex: Treating RN: 1950-11-27 (72 y.o. [name] Primary Care Sharonann Malbrough: Charlott Rakes Other Clinician: Referring Katieann Hungate: Treating [name]/Extender: Roxanna Mew,  [place] Weeks in Treatment: 150 Compression Therapy Performed for Wound Assessment: Wound #2 Right,Medial Lower Leg Performed By: Vanna Scotland, RN Compression Type: Double Layer Post Procedure Diagnosis Same as Pre-procedure Electronic Signature(s) Signed: 07/09/2023 4:13:22 PM By: Hunt Oris Entered By: Hunt Oris on 07/08/2023 09:33:28 [name] ([id number]) [id number] Page 2 of 8 -------------------------------------------------------------------------------- Encounter Discharge Information Details Patient Name: Date of Service: [name], [name] 07/08/2023 9:00 A M Medical Record Number: [id number] Patient Account Number: [id number] Date of Birth/Sex: Treating RN: 1951/01/03 (72 y.o. [name], [name] Primary Care [name]: Charlott Rakes Other Clinician: Referring Khadeem Rockett: Treating Makyna Niehoff/Extender: Roxanna Mew, [name]: 150 Encounter Discharge Information Items Post Procedure Vitals Discharge Condition: Stable Temperature (F): 98.4 Ambulatory Status: Ambulatory Pulse (bpm): 71 Discharge Destination: Home Respiratory Rate (breaths/min): 18 Transportation: Private Auto Blood Pressure (mmHg): 152/74 Accompanied By: self Schedule Follow-up Appointment: Yes Clinical Summary of Care: Patient Declined Electronic Signature(s) Signed: 07/09/2023 4:13:22 PM By: Hunt Oris Entered By: Hunt Oris on 07/08/2023 09:35:43 -------------------------------------------------------------------------------- Lower Extremity Assessment Details Patient Name: Date of Service: [name], [name] 07/08/2023 9:00 A M Medical Record Number: [id number] Patient Account Number: [id number] Date of Birth/Sex: Treating RN: [birth year] (72 y.o. [name], [name] Primary Care Jamaiya Tunnell: Charlott Rakes Other Clinician: Referring Jazyiah Yiu: Treating Macallan Ord/Extender: Roxanna Mew, [place] Weeks in Treatment: 150 Edema  Assessment Assessed: [Left: No] [Right: No] Edema: [Left: Ye] [Right: s] Calf Left: Right: Point of Measurement: 32 cm From Medial Instep 40 cm Ankle Left: Right: Point of Measurement: 9 cm From Medial Instep 24 cm Knee To Floor Left: Right: From Medial Instep 39 cm Vascular Assessment Pulses: Dorsalis Pedis Palpable: [Right:Yes] Extremity colors, hair growth, and conditions: Extremity Color: [Right:Hyperpigmented] Hair Growth on Extremity: [Right:No] Temperature of Extremity: [Right:Warm] Capillary Refill: [Right:< 3 seconds] Dependent Rubor: [Right:No] Hapke, Nautia ([id number]) [Right:Yes] Toe Nail Assessment Left: Right: Thick: Yes Discolored: No Deformed: No Improper Length and Hygiene: No Electronic Signature(s) Signed: 07/09/2023 4:13:22  [name], [place] ([id number]) [id number] Page 1 of 8 Visit Report for 07/08/2023 Arrival Information Details Patient Name: Date of Service: [name], [place] YE 07/08/2023 9:00 A M Medical Record Number: [id number] Patient Account Number: [id number] Date of Birth/Sex: Treating RN: August 24, 1951 (72 y.o. [name], [name] Primary Care Bray Vickerman: Charlott Rakes Other Clinician: Referring Keshayla Schrum: Treating Zyshawn Bohnenkamp/Extender: Roxanna Mew, [name]: 150 Visit Information History Since Last Visit All ordered tests and consults were completed: No Patient Arrived: Ambulatory Added or deleted any medications: No Arrival Time: 08:55 Any new allergies or adverse reactions: No Accompanied By: self Had a fall or experienced change in No Transfer Assistance: None activities of daily living that may affect Patient Identification Verified: Yes risk of falls: Secondary Verification Process Completed: Yes Signs or symptoms of abuse/neglect since last visito No Patient Requires Transmission-Based Precautions: No Hospitalized since last visit: No Patient Has Alerts: No Implantable device outside of the clinic excluding No cellular tissue based products placed in the center since last visit: Has Dressing in Place as Prescribed: Yes Has Compression in Place as Prescribed: Yes Pain Present Now: Yes Electronic Signature(s) Signed: 07/09/2023 4:13:22 PM By: Hunt Oris Entered By: Hunt Oris on 07/08/2023 08:57:14 -------------------------------------------------------------------------------- Compression Therapy Details Patient Name: Date of Service: [name], [name] 07/08/2023 9:00 A M Medical Record Number: [id number] Patient Account Number: [id number] Date of Birth/Sex: Treating RN: 1950-11-27 (72 y.o. [name] Primary Care Sharonann Malbrough: Charlott Rakes Other Clinician: Referring Katieann Hungate: Treating [name]/Extender: Roxanna Mew,  [place] Weeks in Treatment: 150 Compression Therapy Performed for Wound Assessment: Wound #2 Right,Medial Lower Leg Performed By: Vanna Scotland, RN Compression Type: Double Layer Post Procedure Diagnosis Same as Pre-procedure Electronic Signature(s) Signed: 07/09/2023 4:13:22 PM By: Hunt Oris Entered By: Hunt Oris on 07/08/2023 09:33:28 [name] ([id number]) [id number] Page 2 of 8 -------------------------------------------------------------------------------- Encounter Discharge Information Details Patient Name: Date of Service: [name], [name] 07/08/2023 9:00 A M Medical Record Number: [id number] Patient Account Number: [id number] Date of Birth/Sex: Treating RN: 1951/01/03 (72 y.o. [name], [name] Primary Care [name]: Charlott Rakes Other Clinician: Referring Khadeem Rockett: Treating Makyna Niehoff/Extender: Roxanna Mew, [name]: 150 Encounter Discharge Information Items Post Procedure Vitals Discharge Condition: Stable Temperature (F): 98.4 Ambulatory Status: Ambulatory Pulse (bpm): 71 Discharge Destination: Home Respiratory Rate (breaths/min): 18 Transportation: Private Auto Blood Pressure (mmHg): 152/74 Accompanied By: self Schedule Follow-up Appointment: Yes Clinical Summary of Care: Patient Declined Electronic Signature(s) Signed: 07/09/2023 4:13:22 PM By: Hunt Oris Entered By: Hunt Oris on 07/08/2023 09:35:43 -------------------------------------------------------------------------------- Lower Extremity Assessment Details Patient Name: Date of Service: [name], [name] 07/08/2023 9:00 A M Medical Record Number: [id number] Patient Account Number: [id number] Date of Birth/Sex: Treating RN: [birth year] (72 y.o. [name], [name] Primary Care Jamaiya Tunnell: Charlott Rakes Other Clinician: Referring Jazyiah Yiu: Treating Macallan Ord/Extender: Roxanna Mew, [place] Weeks in Treatment: 150 Edema  Assessment Assessed: [Left: No] [Right: No] Edema: [Left: Ye] [Right: s] Calf Left: Right: Point of Measurement: 32 cm From Medial Instep 40 cm Ankle Left: Right: Point of Measurement: 9 cm From Medial Instep 24 cm Knee To Floor Left: Right: From Medial Instep 39 cm Vascular Assessment Pulses: Dorsalis Pedis Palpable: [Right:Yes] Extremity colors, hair growth, and conditions: Extremity Color: [Right:Hyperpigmented] Hair Growth on Extremity: [Right:No] Temperature of Extremity: [Right:Warm] Capillary Refill: [Right:< 3 seconds] Dependent Rubor: [Right:No] Hapke, Nautia ([id number]) [Right:Yes] Toe Nail Assessment Left: Right: Thick: Yes Discolored: No Deformed: No Improper Length and Hygiene: No Electronic Signature(s) Signed: 07/09/2023 4:13:22  PM By: Hunt Oris Entered By: Hunt Oris on 07/08/2023 08:59:37 -------------------------------------------------------------------------------- Multi-Disciplinary Care Plan Details Patient Name: Date of Service: [name], [name] 07/08/2023 9:00 A M Medical Record Number: [id number] Patient Account Number: [id number] Date of Birth/Sex: Treating RN: 07/24/1951 (72 y.o. [name], [name] Primary Care Meighan Treto: Charlott Rakes Other Clinician: Referring Zael Shuman: Treating Armarion Greek/Extender: Roxanna Mew, Christiana Fuchs in Treatment: 150 Multidisciplinary Care Plan reviewed with physician Active Inactive Wound/Skin Impairment Nursing Diagnoses: Impaired tissue integrity Knowledge deficit related to ulceration/compromised skin integrity Goals: Patient/caregiver will verbalize understanding of skin care regimen Date Initiated: 08/22/2020 Target Resolution Date: 08/12/2023 Goal Status: Active Ulcer/skin breakdown will have a volume reduction of 30% by week 4 Date Initiated: 08/22/2020 Date Inactivated: 10/03/2020 Target Resolution Date: 09/19/2020 Goal Status: Met Ulcer/skin breakdown will have a volume reduction of 50% by  week 8 Date Initiated: 06/05/2021 Date Inactivated: 07/17/2021 Target Resolution Date: 07/03/2021 Goal Status: Unmet Unmet Reason: infection Interventions: Assess patient/caregiver ability to obtain necessary supplies Assess patient/caregiver ability to perform ulcer/skin care regimen upon admission and as needed Assess ulceration(s) every visit Treatment Activities: Skin care regimen initiated : 08/22/2020 Topical wound management initiated : 08/22/2020 Notes: 06/05/21: Wound care regimen ongoing. 06/11/22: Wound care regimen continues, applying skin sub ([place]) Electronic Signature(s) Signed: 07/09/2023 4:13:22 PM By: Hunt Oris Entered By: Hunt Oris on 07/08/2023 09:15:07 [name] ([id number]) [id number] Page 4 of 8 -------------------------------------------------------------------------------- Pain Assessment Details Patient Name: Date of Service: [name], [name] 07/08/2023 9:00 A M Medical Record Number: [id number] Patient Account Number: [id number] Date of Birth/Sex: Treating RN: 17-Nov-1950 (72 y.o. [name] Primary Care Nollie Shiflett: Charlott Rakes Other Clinician: Referring Viraaj Vorndran: Treating Loyde Orth/Extender: Roxanna Mew, [place] Weeks in Treatment: 150 Active Problems Location of Pain Severity and Description of Pain Patient Has Paino Yes Site Locations Pain Location: Pain in Ulcers With Dressing Change: Yes Rate the pain. Current Pain Level: 5 Character of Pain Describe the Pain: Aching, Tender Pain Management and Medication Current Pain Management: Medication: Yes How does your wound impact your activities of daily livingo Sleep: No Bathing: No Appetite: No Relationship With Others: No Bladder Continence: No Emotions: No Bowel Continence: No Work: No Toileting: No Drive: No Dressing: No Hobbies: No Electronic Signature(s) Signed: 07/09/2023 4:13:22 PM By: Hunt Oris Entered By: Hunt Oris on 07/08/2023  08:58:38 -------------------------------------------------------------------------------- Patient/Caregiver Education Details Patient Name: Date of Service: [name], [name] 9/25/2024andnbsp9:00 A M Medical Record Number: [id number] Patient Account Number: [id number] Date of Birth/Gender: Treating RN: 01/24/1951 (72 y.o. [name] Primary Care Physician: Charlott Rakes Other Clinician: Referring Physician: Treating Physician/Extender: Roxanna Mew, [name]: 150 [place], [place] ([id number]) [id number] Page 5 of 8 Education Assessment Education Provided To: Patient Education Topics Provided Venous: Handouts: Controlling Swelling with Compression Stockings Methods: Explain/Verbal Responses: Reinforcements needed Electronic Signature(s) Signed: 07/09/2023 4:13:22 PM By: Hunt Oris Entered By: Hunt Oris on 07/08/2023 09:16:07 -------------------------------------------------------------------------------- Wound Assessment Details Patient Name: Date of Service: [name], [name] 07/08/2023 9:00 A M Medical Record Number: [id number] Patient Account Number: [id number] Date of Birth/Sex: Treating RN: 02/10/1951 (72 y.o. [name], [name] Primary Care Shadai Mcclane: Charlott Rakes Other Clinician: Referring Britnie Colville: Treating Eoin Willden/Extender: Roxanna Mew, [place] Weeks in Treatment: 150 Wound Status Wound Number: 1 Primary Soft Tissue Radionecrosis Etiology: Wound Location: Right Breast (mastectomy site) Wound Open Wounding Event: Radiation Burn Status: Date Acquired: 07/26/2020 Comorbid Anemia, Lymphedema, Deep Vein Thrombosis, Hypertension, Weeks Of Treatment: 150 History: Peripheral Venous Disease, Osteoarthritis, Received  Yes Erythema: No Maceration: Yes Hemosiderin Staining: Yes Mottled: No Pallor: No Rubor: No Treatment Notes Wound #2 (Lower Leg) Wound Laterality: Right, Medial Cleanser Vashe 5.8 (oz) Discharge Instruction: Cleanse the wound with Vashe prior to applying a clean dressing using gauze sponges, not tissue or cotton balls. Peri-Wound Care Sween Lotion (Moisturizing lotion) Discharge Instruction: Apply moisturizing lotion as directed SALENA, TORNETTA ([id number]) [id number] Page 8 of 8 Topical Triamcinolone Discharge Instruction: Apply Triamcinolone as directed Primary Dressing Aquacel Ag Discharge Instruction: apply directly to wound bed. Secondary Dressing ABD Pad, 8x10 Discharge Instruction: Apply over primary dressing as directed. Secured With Compression Wrap Urgo K2 Lite, (equivalent to a 3 layer) two layer compression system, regular Discharge Instruction: ******APPLY FIRST LAYER OF UNNA BOOT TO UPPER PORTION OF LOWER LEG.*****Apply Urgo K2 Lite as directed (alternative to 3 layer compression). Compression Stockings Add-Ons Electronic Signature(s) Signed: 07/08/2023 7:24:00 PM By: Shawn Stall RN, BSN Signed: 07/09/2023 4:13:22 PM By: Hunt Oris Entered By: Shawn Stall on 07/08/2023 09:03:02 -------------------------------------------------------------------------------- Vitals Details Patient Name: Date of Service: [name], [name] 07/08/2023 9:00 A M Medical Record Number:  [id number] Patient Account Number: [id number] Date of Birth/Sex: Treating RN: [birth year] (72 y.o. [name], [name] Primary Care Kayana Thoen: Charlott Rakes Other Clinician: Referring Athaliah Baumbach: Treating Elior Robinette/Extender: Roxanna Mew, [name]: 150 Vital Signs Time Taken: 08:56 Temperature (F): 98.4 Height (in): 63 Pulse (bpm): 71 Weight (lbs): 176 Respiratory Rate (breaths/min): 18 Body Mass Index (BMI): 31.2 Blood Pressure (mmHg): 152/74 Reference Range: 80 - 120 mg / dl Electronic Signature(s) Signed: 07/09/2023 4:13:22 PM By: Hunt Oris Entered By: Hunt Oris on 07/08/2023 08:57:40

## 2023-07-13 ENCOUNTER — Inpatient Hospital Stay: Payer: Medicare Other

## 2023-07-13 ENCOUNTER — Inpatient Hospital Stay: Payer: Medicare Other | Attending: Hematology & Oncology

## 2023-07-13 DIAGNOSIS — D631 Anemia in chronic kidney disease: Secondary | ICD-10-CM | POA: Insufficient documentation

## 2023-07-13 DIAGNOSIS — Z79811 Long term (current) use of aromatase inhibitors: Secondary | ICD-10-CM | POA: Insufficient documentation

## 2023-07-13 DIAGNOSIS — D508 Other iron deficiency anemias: Secondary | ICD-10-CM

## 2023-07-13 DIAGNOSIS — N189 Chronic kidney disease, unspecified: Secondary | ICD-10-CM | POA: Insufficient documentation

## 2023-07-13 DIAGNOSIS — C50911 Malignant neoplasm of unspecified site of right female breast: Secondary | ICD-10-CM | POA: Diagnosis not present

## 2023-07-13 DIAGNOSIS — Z86718 Personal history of other venous thrombosis and embolism: Secondary | ICD-10-CM | POA: Diagnosis not present

## 2023-07-13 DIAGNOSIS — C50011 Malignant neoplasm of nipple and areola, right female breast: Secondary | ICD-10-CM

## 2023-07-13 DIAGNOSIS — D509 Iron deficiency anemia, unspecified: Secondary | ICD-10-CM | POA: Insufficient documentation

## 2023-07-13 LAB — CBC WITH DIFFERENTIAL (CANCER CENTER ONLY)
Abs Immature Granulocytes: 0.02 10*3/uL (ref 0.00–0.07)
Basophils Absolute: 0 10*3/uL (ref 0.0–0.1)
Basophils Relative: 1 %
Eosinophils Absolute: 0.2 10*3/uL (ref 0.0–0.5)
Eosinophils Relative: 4 %
HCT: 35.8 % — ABNORMAL LOW (ref 36.0–46.0)
Hemoglobin: 11 g/dL — ABNORMAL LOW (ref 12.0–15.0)
Immature Granulocytes: 0 %
Lymphocytes Relative: 33 %
Lymphs Abs: 2 10*3/uL (ref 0.7–4.0)
MCH: 28.6 pg (ref 26.0–34.0)
MCHC: 30.7 g/dL (ref 30.0–36.0)
MCV: 93 fL (ref 80.0–100.0)
Monocytes Absolute: 0.5 10*3/uL (ref 0.1–1.0)
Monocytes Relative: 8 %
Neutro Abs: 3.4 10*3/uL (ref 1.7–7.7)
Neutrophils Relative %: 54 %
Platelet Count: 190 10*3/uL (ref 150–400)
RBC: 3.85 MIL/uL — ABNORMAL LOW (ref 3.87–5.11)
RDW: 14.3 % (ref 11.5–15.5)
WBC Count: 6.2 10*3/uL (ref 4.0–10.5)
nRBC: 0 % (ref 0.0–0.2)

## 2023-07-13 LAB — CMP (CANCER CENTER ONLY)
ALT: 12 U/L (ref 0–44)
AST: 13 U/L — ABNORMAL LOW (ref 15–41)
Albumin: 3.9 g/dL (ref 3.5–5.0)
Alkaline Phosphatase: 49 U/L (ref 38–126)
Anion gap: 8 (ref 5–15)
BUN: 19 mg/dL (ref 8–23)
CO2: 29 mmol/L (ref 22–32)
Calcium: 9 mg/dL (ref 8.9–10.3)
Chloride: 109 mmol/L (ref 98–111)
Creatinine: 0.69 mg/dL (ref 0.44–1.00)
GFR, Estimated: >60 mL/min (ref >60)
Glucose, Bld: 100 mg/dL — ABNORMAL HIGH (ref 70–99)
Potassium: 4.4 mmol/L (ref 3.5–5.1)
Sodium: 146 mmol/L — ABNORMAL HIGH (ref 135–145)
Total Bilirubin: 0.5 mg/dL (ref 0.3–1.2)
Total Protein: 7.3 g/dL (ref 6.5–8.1)

## 2023-07-13 NOTE — Progress Notes (Signed)
Pt.'s HGB-11.0.  Pt notified that she will not need Aranesp today and to return as scheduled. Pt has no questions at this time.

## 2023-07-15 ENCOUNTER — Encounter (HOSPITAL_BASED_OUTPATIENT_CLINIC_OR_DEPARTMENT_OTHER): Payer: Medicare Other | Attending: Physician Assistant | Admitting: Physician Assistant

## 2023-07-15 DIAGNOSIS — L598 Other specified disorders of the skin and subcutaneous tissue related to radiation: Secondary | ICD-10-CM | POA: Insufficient documentation

## 2023-07-15 DIAGNOSIS — Z923 Personal history of irradiation: Secondary | ICD-10-CM | POA: Insufficient documentation

## 2023-07-15 DIAGNOSIS — Z853 Personal history of malignant neoplasm of breast: Secondary | ICD-10-CM | POA: Insufficient documentation

## 2023-07-15 DIAGNOSIS — Z9011 Acquired absence of right breast and nipple: Secondary | ICD-10-CM | POA: Diagnosis not present

## 2023-07-15 DIAGNOSIS — M1990 Unspecified osteoarthritis, unspecified site: Secondary | ICD-10-CM | POA: Insufficient documentation

## 2023-07-15 DIAGNOSIS — I10 Essential (primary) hypertension: Secondary | ICD-10-CM | POA: Diagnosis not present

## 2023-07-15 DIAGNOSIS — L97311 Non-pressure chronic ulcer of right ankle limited to breakdown of skin: Secondary | ICD-10-CM | POA: Diagnosis not present

## 2023-07-15 DIAGNOSIS — L589 Radiodermatitis, unspecified: Secondary | ICD-10-CM | POA: Insufficient documentation

## 2023-07-15 DIAGNOSIS — L98492 Non-pressure chronic ulcer of skin of other sites with fat layer exposed: Secondary | ICD-10-CM | POA: Diagnosis not present

## 2023-07-15 DIAGNOSIS — Z9221 Personal history of antineoplastic chemotherapy: Secondary | ICD-10-CM | POA: Insufficient documentation

## 2023-07-15 DIAGNOSIS — Z86718 Personal history of other venous thrombosis and embolism: Secondary | ICD-10-CM | POA: Insufficient documentation

## 2023-07-15 DIAGNOSIS — I890 Lymphedema, not elsewhere classified: Secondary | ICD-10-CM | POA: Diagnosis not present

## 2023-07-15 DIAGNOSIS — I87331 Chronic venous hypertension (idiopathic) with ulcer and inflammation of right lower extremity: Secondary | ICD-10-CM | POA: Diagnosis not present

## 2023-07-15 DIAGNOSIS — Y842 Radiological procedure and radiotherapy as the cause of abnormal reaction of the patient, or of later complication, without mention of misadventure at the time of the procedure: Secondary | ICD-10-CM | POA: Insufficient documentation

## 2023-07-15 NOTE — Progress Notes (Signed)
[name], [name] ([id number]) [id number] Page 1 of 4 Visit Report for 07/15/2023 Chief Complaint Document Details Patient Name: Date of Service: [name], [name] 07/15/2023 2:00 PM Medical Record Number: [id number] Patient Account Number: [id number] Date of Birth/Sex: Treating RN: [birth year] (72 y.o. 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 [name] Primary Care Provider: Charlott Rakes Other Clinician: Referring Provider: Treating Provider/Extender: Roxanna Mew, [name]: [phone] Verbal / Phone Orders: No Diagnosis Coding ICD-10 Coding Code Description [phone] Non-pressure chronic ulcer of skin of other sites with fat layer exposed L58.9 Radiodermatitis, unspecified L59.8 Other specified disorders of the skin and subcutaneous tissue related to radiation I87.331 Chronic venous hypertension (idiopathic) with ulcer and inflammation of right lower extremity 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 Allen Derry, PA Room 8 Other: - wear compression stockings daily Anesthetic (In clinic) Topical Lidocaine 4% applied to wound  bed Cellular or Tissue Based Products daptic or Mepitel. (DO NOT REMOVE). - Cellular or Tissue Based Product applied to wound bed, secured with steri-strips, cover with A Already Done- Epicord #1 06/11/22, #2 06/18/22, #3 06/25/22, #4 07/02/22, #5 07/09/22, #6 07/16/22, Epicord #7 07/23/22, Epicord #8 07/30/22, Epicord #9 08/06/2022. Epicord # 10 08/13/22 - no more after 10th application, per PA we will see how she does after this with collagen for now. Bathing/ Shower/ Hygiene May shower with protection but do not get wound dressing(s) wet. Protect dressing(s) with water repellant cover (for example, large plastic bag) or a cast cover and may then take shower. [name], [name] ([id number]) [id number] Page 2 of 4 Edema Control - Lymphedema / SCD / Other Elevate legs to the level of the heart or above for 30 minutes daily and/or when sitting for 3-4 times a day throughout the day. Avoid standing for long periods of time. Exercise regularly Compression stocking or Garment 20-30 mm/Hg pressure to: - Provide leg measurements. May purchase online or elastic therapy. Additional Orders / Instructions Follow Nutritious Diet Wound Treatment Wound #1 - Breast (mastectomy site) Wound Laterality: Right Cleanser: Wound Cleanser (Generic) Every Other Day/30 Days Discharge Instructions: Cleanse the wound with wound cleanser prior to applying a clean dressing using gauze sponges, not tissue or cotton balls. Cleanser: [place] Ancillary Kit - 15 Day Supply (Generic) Every Other Day/30 Days Discharge Instructions: Use supplies as instructed; Kit contains: (15) Saline Bullets; (15) 3x3 Gauze; 15 pr Gloves Cleanser: Soap and Water Every Other Day/30 Days Discharge Instructions: May shower and wash wound with dial antibacterial soap and water prior to dressing change. Peri-Wound Care: Skin Prep (Generic) Every Other Day/30 Days Discharge Instructions: Use skin prep as directed Prim Dressing:  Hydrofera Blue Ready Transfer Foam, 2.5x2.5 (in/in) (Dispense As Written) Every Other Day/30 Days ary Discharge Instructions: Apply directly to wound bed as directed Secondary Dressing: Woven Gauze Sponge, Non-Sterile 4x4 in (Generic) Every Other Day/30 Days Discharge Instructions: Apply over primary dressing as directed.  Secondary Dressing: Woven Gauze Sponges 2x2 in (Generic) Every Other Day/30 Days Discharge Instructions: Apply over primary dressing, fold in corners to fill the space Secondary Dressing: Zetuvit Plus Silicone Border Dressing 5x5 (in/in) (Generic) Every Other Day/30 Days Discharge Instructions: Apply silicone border over primary dressing as directed. Patient Medications llergies: Iodinated Contrast Media A Notifications Medication Indication Start End 07/15/2023 lidocaine DOSE topical 5 % ointment - ointment topical once daily Electronic Signature(s) Unsigned Entered By: Redmond Pulling on 07/15/2023 12:22:42 -------------------------------------------------------------------------------- Problem List Details Patient Name: Date of Service: [name], [name] 07/15/2023 2:00 PM Medical Record Number: [id number] Patient Account Number: [id number] Date of Birth/Sex: Treating RN: 04-05-1951 (72 y.o. F) Primary Care Provider: Charlott Rakes Other Clinician: Referring Provider: Treating Provider/Extender: Roxanna Mew, [name] Weeks in Treatment: [phone] Active Problems ICD-10 Encounter Code Description Active Date MDM Diagnosis L98.492 Non-pressure chronic ulcer of skin of other sites with fat layer exposed 08/22/2020 No Yes [name], [name] ([id number]) [id number] Page 3 of 4 L58.9 Radiodermatitis, unspecified 08/22/2020 No Yes L59.8 Other specified disorders of the skin and subcutaneous tissue related to 08/22/2020 No Yes radiation I87.331 Chronic venous hypertension (idiopathic) with ulcer and inflammation of right 07/01/2023 No  Yes lower extremity 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 07/01/2023 No Yes I10 Essential (primary) hypertension 08/22/2020 No Yes Inactive Problems Resolved Problems Electronic Signature(s) Signed: 07/15/2023 2:38:03 PM By: Allen Derry PA-C Entered By: Allen Derry on 07/15/2023 11:38:02 -------------------------------------------------------------------------------- SuperBill Details Patient Name: Date of Service: [name], [name] 07/15/2023 Medical Record Number: [id number] Patient Account Number: [id number] Date of Birth/Sex: Treating RN: 1951-05-25 (72 y.o. [name] Primary Care Provider: Charlott Rakes Other Clinician: Referring Provider: Treating Provider/Extender: Roxanna Mew, [name]: 151 Diagnosis Coding ICD-10 Codes Code Description [phone] Non-pressure chronic ulcer of skin of other sites with fat layer exposed L58.9 Radiodermatitis, unspecified L59.8 Other specified disorders of the skin and subcutaneous tissue related to radiation I87.331 Chronic venous hypertension (idiopathic) with ulcer and inflammation of right lower extremity 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 I10 Essential (primary) hypertension Facility Procedures : The patient participates with Medicare or their insurance follows the Medicare Facility Guidelines: CPT4 Code Description Modifier Quantity [id number] [phone] - WOUND CARE VISIT-LEV 3 EST PT 1 Electronic Signature(s) Unsigned Entered By: Redmond Pulling on 07/15/2023 12:32:54 Signature(s): Date(s): [name] ([id number]) [id number] Page 4 of 4

## 2023-07-16 NOTE — Progress Notes (Signed)
[name] ([id number]) [id number] Page 7 of 9 Treatment Notes Wound #1 (Breast (mastectomy site)) Wound Laterality: Right Cleanser Wound  Cleanser Discharge Instruction: Cleanse the wound with wound cleanser prior to applying a clean dressing using gauze sponges, not tissue or cotton balls. [facility] Ancillary Kit - 15 Day Supply Discharge Instruction: Use supplies as instructed; Kit contains: (15) Saline Bullets; (15) 3x3 Gauze; 15 pr Gloves Soap and Water Discharge Instruction: May shower and wash wound with dial antibacterial soap and water prior to dressing change. Peri-Wound Care Skin Prep Discharge Instruction: Use skin prep as directed Topical Primary Dressing Hydrofera Blue Ready Transfer Foam, 2.5x2.5 (in/in) Discharge Instruction: Apply directly to wound bed as directed Secondary Dressing Woven Gauze Sponge, Non-Sterile 4x4 in Discharge Instruction: Apply over primary dressing as directed. Woven Gauze Sponges 2x2 in Discharge Instruction: Apply over primary dressing, fold in corners to fill the space Zetuvit Plus Silicone Border Dressing 5x5 (in/in) Discharge Instruction: Apply silicone border over primary dressing as directed. Secured With Compression Wrap Compression Stockings Facilities manager) Signed: 07/16/2023 4:51:22 PM By: [name] Pulling RN, BSN Entered By: [name] on 07/15/2023 12:05:15 -------------------------------------------------------------------------------- Wound Assessment Details Patient Name: Date of Service: [name] 07/15/2023 2:00 PM Medical Record Number: [id number] Patient Account Number: [id number] Date of Birth/Sex: Treating RN: [name] (72 y.o. [name] Primary Care [name]: [name] Other Clinician: Referring [name]: Treating [name] [name] Wound Status Wound Number: 2 Primary Venous Leg Ulcer Etiology: Wound Location: Right, Medial Lower Leg Secondary Lymphedema Wounding Event: Blister Etiology: Date Acquired: 05/31/2023 Wound Healed - Epithelialized [name] Of  Treatment: 2 Status: Clustered Wound: No Comorbid Anemia, Lymphedema, Deep Vein Thrombosis, Hypertension, History: Peripheral Venous Disease, Osteoarthritis, Received Chemotherapy, Received Radiation, Confinement Anxiety Photos [name], [name] ([id number]) [id number] Page 8 of 9 Wound Measurements Length: (cm) Width: (cm) Depth: (cm) Area: (cm) Volume: (cm) 0 % Reduction in Area: 100% 0 % Reduction in Volume: 100% 0 Epithelialization: Large (67-100%) 0 Tunneling: No 0 Undermining: No Wound Description Classification: Full Thickness Without Exposed Support Structures Wound Margin: Distinct, outline attached Exudate Amount: None Present Foul Odor After Cleansing: No Slough/Fibrino No Wound Bed Granulation Amount: None Present (0%) Exposed Structure Necrotic Amount: None Present (0%) Fascia Exposed: No Fat Layer (Subcutaneous Tissue) Exposed: No Tendon Exposed: No Muscle Exposed: No Joint Exposed: No Bone Exposed: No Periwound Skin Texture Texture Color No Abnormalities Noted: Yes No Abnormalities Noted: No Atrophie Blanche: No Moisture Cyanosis: No No Abnormalities Noted: No Ecchymosis: No Dry / Scaly: Yes Erythema: No Maceration: Yes Hemosiderin Staining: Yes Mottled: No Pallor: No Rubor: No Electronic Signature(s) Signed: 07/16/2023 4:51:22 PM By: [name] Pulling RN, BSN Entered By: [name] on 07/15/2023 12:19:17 -------------------------------------------------------------------------------- Vitals Details Patient Name: Date of Service: [name] 07/15/2023 2:00 PM Medical Record Number: [id number] Patient Account Number: [id number] Date of Birth/Sex: Treating RN: [name] (72 y.o. [name] Primary Care Arianis [place]: [name] Other Clinician: Referring Livan Hires: Treating Rhaya Coale/Extender: [name], [name] [name] Vital Signs Time Taken: 15:00 Temperature (F): 98.4 Height  (in): 63 Pulse (bpm): 69 Weight (lbs): 176 Respiratory Rate (breaths/min): 18 Body Mass Index (BMI): 31.2 Blood Pressure (mmHg): 158/76 Reference Range: 80 - 120 mg / dl [name], [name] ([id number]) [id number] Page 9 of 9 Electronic Signature(s) Signed: 07/16/2023 4:51:22 PM By: [name] Pulling RN, BSN Entered By: [name] on 07/15/2023 12:07:26  [name], [name] ([id number]) [id number] Page 1 of 9 Visit Report for 07/15/2023 Arrival Information Details Patient Name: Date of Service: [name] INS, [place] [name] 07/15/2023 2:00 PM Medical Record Number: [id number] Patient Account Number: [id number] Date of Birth/Sex: Treating RN: [birth year] (72 y.o. [name] Primary Care Adeyemi Hamad: [name] Other Clinician: Referring Nevaan Bunton: Treating Aleigh Grunden/Extender: [name], [name] [name] Visit Information History Since Last Visit Added or deleted any medications: No Patient Arrived: Ambulatory Any new allergies or adverse reactions: No Arrival Time: 14:54 Had a fall or experienced change in No Accompanied By: self activities of daily living that may affect Transfer Assistance: None risk of falls: Patient Identification Verified: Yes Signs or symptoms of abuse/neglect since last visito No Secondary Verification Process Completed: Yes Hospitalized since last visit: No Patient Requires Transmission-Based Precautions: No Implantable device outside of the clinic excluding No Patient Has Alerts: No cellular tissue based products placed in the center since last visit: Has Dressing in Place as Prescribed: Yes Has Compression in Place as Prescribed: Yes Pain Present Now: No Electronic Signature(s) Signed: 07/16/2023 4:51:22 PM By: [name] Pulling RN, BSN Entered By: [name] on 07/15/2023 11:55:36 -------------------------------------------------------------------------------- Clinic Level of Care Assessment Details Patient Name: Date of Service: [name] 07/15/2023 2:00 PM Medical Record Number: [id number] Patient Account Number: [id number] Date of Birth/Sex: Treating RN: [birth year] (72 y.o. [name] Primary Care Ardyth Kelso: [name] Other Clinician: Referring Paislee Szatkowski: Treating Mukund Weinreb/Extender: [name], [name] [name] in Treatment:  [name] [facility] Level of Care Assessment Items TOOL 4 Quantity Score X- 1 0 Use when only an EandM is performed on FOLLOW-UP visit ASSESSMENTS - Nursing Assessment / Reassessment X- 1 10 Reassessment of Co-morbidities (includes updates in patient status) X- 1 5 Reassessment of Adherence to Treatment Plan ASSESSMENTS - Wound and Skin A ssessment / Reassessment []  - 0 Simple Wound Assessment / Reassessment - one wound X- 2 5 Complex Wound Assessment / Reassessment - multiple wounds []  - 0 Dermatologic / Skin Assessment (not related to wound area) ASSESSMENTS - Focused Assessment X- 1 5 Circumferential Edema Measurements - multi extremities []  - 0 Nutritional Assessment / Counseling / Intervention [name] ([id number]) [id number] Page 2 of 9 []  - 0 Lower Extremity Assessment (monofilament, tuning fork, pulses) []  - 0 Peripheral Arterial Disease Assessment (using hand held doppler) ASSESSMENTS - Ostomy and/or Continence Assessment and Care []  - 0 Incontinence Assessment and Management []  - 0 Ostomy Care Assessment and Management (repouching, etc.) PROCESS - Coordination of Care X - Simple Patient / Family Education for ongoing care 1 15 []  - 0 Complex (extensive) Patient / Family Education for ongoing care X- 1 10 Staff obtains Chiropractor, Records, T Results / Process Orders est []  - 0 Staff telephones HHA, Nursing Homes / Clarify orders / etc []  - 0 Routine Transfer to another Facility (non-emergent condition) []  - 0 Routine Hospital Admission (non-emergent condition) []  - 0 New Admissions / Manufacturing engineer / Ordering NPWT Apligraf, etc. , []  - 0 Emergency Hospital Admission (emergent condition) []  - 0 Simple Discharge Coordination []  - 0 Complex (extensive) Discharge Coordination PROCESS - Special Needs []  - 0 Pediatric / Minor Patient Management []  - 0 Isolation Patient Management []  - 0 Hearing / Language / Visual special  needs []  - 0 Assessment of Community assistance (transportation, D/C planning, etc.) []  - 0 Additional assistance / Altered mentation []  - 0 Support Surface(s) Assessment (bed, cushion, seat, etc.) INTERVENTIONS - Wound Cleansing /  Active Inactive Wound/Skin Impairment Nursing Diagnoses: Impaired tissue integrity Knowledge deficit related to ulceration/compromised skin integrity Goals: Patient/caregiver will verbalize understanding of skin care regimen Date Initiated: 08/22/2020 Target Resolution Date: 08/12/2023 Goal Status: Active Ulcer/skin breakdown will have a volume reduction of 30% by week 4 Date Initiated: 08/22/2020 Date Inactivated: 10/03/2020 Target Resolution Date: 09/19/2020 Goal Status: Met Ulcer/skin breakdown will have a volume reduction of 50% by week 8 Date Initiated: 06/05/2021 Date Inactivated: 07/17/2021 Target Resolution Date: 07/03/2021 Goal Status: Unmet Unmet Reason: infection Interventions: Assess patient/caregiver ability to obtain necessary supplies Assess patient/caregiver ability to perform ulcer/skin care regimen upon admission and as needed Assess ulceration(s) every visit Treatment Activities: Skin care regimen initiated : 08/22/2020 Topical wound management initiated : 08/22/2020 Notes: 06/05/21: Wound care regimen ongoing. 06/11/22: Wound care regimen continues, applying skin sub ([place]) Electronic Signature(s) Signed: 07/16/2023  4:51:22 PM By: [name] Pulling RN, BSN Entered By: [name] on 07/15/2023 12:18:26 [name] ([id number]) [id number] Page 5 of 9 -------------------------------------------------------------------------------- Pain Assessment Details Patient Name: Date of Service: [name] 07/15/2023 2:00 PM Medical Record Number: [id number] Patient Account Number: [id number] Date of Birth/Sex: Treating RN: [birth year] [name] (72 y.o. [name] Primary Care Kayzlee Wirtanen: [name] Other Clinician: Referring Kenedi Cilia: Treating Anjelo Pullman/Extender: [name], [name] [name] in Treatment: [phone] Active Problems Location of Pain Severity and Description of Pain Patient Has Paino No Site Locations Pain Management and Medication Current Pain Management: Electronic Signature(s) Signed: 07/16/2023 4:51:22 PM By: [name] Pulling RN, BSN Entered By: [name] on 07/15/2023 12:07:35 -------------------------------------------------------------------------------- Patient/Caregiver Education Details Patient Name: Date of Service: [name], [name] 10/2/2024andnbsp2:00 PM Medical Record Number: [id number] Patient Account Number: [id number] Date of Birth/Gender: Treating RN: [name] (72 y.o. [name] Primary Care Physician: [name] Other Clinician: Referring Physician: Treating Physician/Extender: [name], Christiana Fuchs in Treatment: [name] Education Assessment Education Provided To: Patient Education Topics Provided Wound/Skin Impairment: Methods: Explain/Verbal Responses: State content correctly [employer], [name] ([id number]) [id number] Page 6 of 9 Electronic Signature(s) Signed: 07/16/2023 4:51:22 PM By: [name] Pulling RN, BSN Entered By: [name] on 07/15/2023 12:18:42 -------------------------------------------------------------------------------- Wound Assessment Details Patient Name: Date  of Service: [name] 07/15/2023 2:00 PM Medical Record Number: [id number] Patient Account Number: [id number] Date of Birth/Sex: Treating RN: [birth year] [name] (72 y.o. [name] Primary Care Furman Trentman: [name] Other Clinician: Referring Tiffani Kadow: Treating Embry Manrique/Extender: [name], [name] [name] Wound Status Wound Number: 1 Primary Soft Tissue Radionecrosis Etiology: Wound Location: Right Breast (mastectomy site) Wound Open Wounding Event: Radiation Burn Status: Date Acquired: 07/26/2020 Comorbid Anemia, Lymphedema, Deep Vein Thrombosis, Hypertension, [name] Of Treatment: [name] History: Peripheral Venous Disease, Osteoarthritis, Received Clustered Wound: No Chemotherapy, Received Radiation, Confinement Anxiety Photos Wound Measurements Length: (cm) 1.5 Width: (cm) 2.5 Depth: (cm) 0.5 Area: (cm) 2.945 Volume: (cm) 1.473 % Reduction in Area: -92.2% % Reduction in Volume: -862.7% Epithelialization: Small (1-33%) Tunneling: No Undermining: No Wound Description Classification: Full Thickness Without Exposed Suppor Wound Margin: Epibole Exudate Amount: Medium Exudate Type: Serosanguineous Exudate Color: red, brown t Structures Foul Odor After Cleansing: No Slough/Fibrino Yes Wound Bed Granulation Amount: Medium (34-66%) Exposed Structure Granulation Quality: Pink, Pale Fascia Exposed: No Necrotic Amount: Medium (34-66%) Fat Layer (Subcutaneous Tissue) Exposed: Yes Necrotic Quality: Adherent Slough Tendon Exposed: No Muscle Exposed: No Joint Exposed: No Bone Exposed: No Periwound Skin Texture Texture Color No Abnormalities Noted: Yes No Abnormalities Noted: Yes Moisture Temperature / Pain No Abnormalities Noted: Yes Temperature: No Abnormality [place],  [name], [name] ([id number]) [id number] Page 1 of 9 Visit Report for 07/15/2023 Arrival Information Details Patient Name: Date of Service: [name] INS, [place] [name] 07/15/2023 2:00 PM Medical Record Number: [id number] Patient Account Number: [id number] Date of Birth/Sex: Treating RN: [birth year] (72 y.o. [name] Primary Care Adeyemi Hamad: [name] Other Clinician: Referring Nevaan Bunton: Treating Aleigh Grunden/Extender: [name], [name] [name] Visit Information History Since Last Visit Added or deleted any medications: No Patient Arrived: Ambulatory Any new allergies or adverse reactions: No Arrival Time: 14:54 Had a fall or experienced change in No Accompanied By: self activities of daily living that may affect Transfer Assistance: None risk of falls: Patient Identification Verified: Yes Signs or symptoms of abuse/neglect since last visito No Secondary Verification Process Completed: Yes Hospitalized since last visit: No Patient Requires Transmission-Based Precautions: No Implantable device outside of the clinic excluding No Patient Has Alerts: No cellular tissue based products placed in the center since last visit: Has Dressing in Place as Prescribed: Yes Has Compression in Place as Prescribed: Yes Pain Present Now: No Electronic Signature(s) Signed: 07/16/2023 4:51:22 PM By: [name] Pulling RN, BSN Entered By: [name] on 07/15/2023 11:55:36 -------------------------------------------------------------------------------- Clinic Level of Care Assessment Details Patient Name: Date of Service: [name] 07/15/2023 2:00 PM Medical Record Number: [id number] Patient Account Number: [id number] Date of Birth/Sex: Treating RN: [birth year] (72 y.o. [name] Primary Care Ardyth Kelso: [name] Other Clinician: Referring Paislee Szatkowski: Treating Mukund Weinreb/Extender: [name], [name] [name] in Treatment:  [name] Clinic Level of Care Assessment Items TOOL 4 Quantity Score X- 1 0 Use when only an EandM is performed on FOLLOW-UP visit ASSESSMENTS - Nursing Assessment / Reassessment X- 1 10 Reassessment of Co-morbidities (includes updates in patient status) X- 1 5 Reassessment of Adherence to Treatment Plan ASSESSMENTS - Wound and Skin A ssessment / Reassessment []  - 0 Simple Wound Assessment / Reassessment - one wound X- 2 5 Complex Wound Assessment / Reassessment - multiple wounds []  - 0 Dermatologic / Skin Assessment (not related to wound area) ASSESSMENTS - Focused Assessment X- 1 5 Circumferential Edema Measurements - multi extremities []  - 0 Nutritional Assessment / Counseling / Intervention [name] ([id number]) [id number] Page 2 of 9 []  - 0 Lower Extremity Assessment (monofilament, tuning fork, pulses) []  - 0 Peripheral Arterial Disease Assessment (using hand held doppler) ASSESSMENTS - Ostomy and/or Continence Assessment and Care []  - 0 Incontinence Assessment and Management []  - 0 Ostomy Care Assessment and Management (repouching, etc.) PROCESS - Coordination of Care X - Simple Patient / Family Education for ongoing care 1 15 []  - 0 Complex (extensive) Patient / Family Education for ongoing care X- 1 10 Staff obtains Chiropractor, Records, T Results / Process Orders est []  - 0 Staff telephones HHA, Nursing Homes / Clarify orders / etc []  - 0 Routine Transfer to another Facility (non-emergent condition) []  - 0 Routine Hospital Admission (non-emergent condition) []  - 0 New Admissions / Manufacturing engineer / Ordering NPWT Apligraf, etc. , []  - 0 Emergency Hospital Admission (emergent condition) []  - 0 Simple Discharge Coordination []  - 0 Complex (extensive) Discharge Coordination PROCESS - Special Needs []  - 0 Pediatric / Minor Patient Management []  - 0 Isolation Patient Management []  - 0 Hearing / Language / Visual special  needs []  - 0 Assessment of Community assistance (transportation, D/C planning, etc.) []  - 0 Additional assistance / Altered mentation []  - 0 Support Surface(s) Assessment (bed, cushion, seat, etc.) INTERVENTIONS - Wound Cleansing /

## 2023-07-22 ENCOUNTER — Encounter (HOSPITAL_BASED_OUTPATIENT_CLINIC_OR_DEPARTMENT_OTHER): Payer: Medicare Other | Admitting: Physician Assistant

## 2023-08-04 DIAGNOSIS — L602 Onychogryphosis: Secondary | ICD-10-CM | POA: Diagnosis not present

## 2023-08-12 ENCOUNTER — Encounter (HOSPITAL_BASED_OUTPATIENT_CLINIC_OR_DEPARTMENT_OTHER): Payer: Medicare Other | Admitting: Physician Assistant

## 2023-08-12 DIAGNOSIS — I10 Essential (primary) hypertension: Secondary | ICD-10-CM | POA: Diagnosis not present

## 2023-08-12 DIAGNOSIS — L97311 Non-pressure chronic ulcer of right ankle limited to breakdown of skin: Secondary | ICD-10-CM | POA: Diagnosis not present

## 2023-08-12 DIAGNOSIS — L98492 Non-pressure chronic ulcer of skin of other sites with fat layer exposed: Secondary | ICD-10-CM | POA: Diagnosis not present

## 2023-08-12 DIAGNOSIS — L598 Other specified disorders of the skin and subcutaneous tissue related to radiation: Secondary | ICD-10-CM | POA: Diagnosis not present

## 2023-08-12 DIAGNOSIS — L589 Radiodermatitis, unspecified: Secondary | ICD-10-CM | POA: Diagnosis not present

## 2023-08-12 DIAGNOSIS — I87331 Chronic venous hypertension (idiopathic) with ulcer and inflammation of right lower extremity: Secondary | ICD-10-CM | POA: Diagnosis not present

## 2023-08-12 NOTE — Progress Notes (Addendum)
AUBREANA, CORNACCHIA ([id number]) [id number] Page 1 of 11 Visit Report for 08/12/2023 Chief Complaint Document Details Patient Name: Date of Service: [name] YE 08/12/2023 9:30 A M Medical Record Number: [id number] Patient Account Number: [id number] Date of Birth/Sex: Treating RN: Mar 29, 1951 (72 y.o. 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 F) Primary Care Provider: Charlott Rakes Other Clinician: Referring Provider: Treating Provider/Extender: Roxanna Mew, [place] Weeks in Treatment: 155 History of Present Illness HPI Description: 08/22/2020 upon evaluation today patient actually appears to be doing somewhat poorly in regard to her mastectomy site on the right chest wall. She had a mastectomy initially in 1998. She had chemotherapy only no radiation following. In 2002 she subsequently did undergo radiation for the first time and then subsequently in 2012 had repeat radiation after having had a finding that was consistent with a relapse of the cancer. 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 She also has radiation damage to the skin over the right chest wall where she had the mastectomy. She was referred to [place] by Dr. Marcelle Overlie in  [facility] and her oncologist is Dr. Arlan Organ. Subsequently I want to make sure before we delve into this more deeply that the patient does not have any issues with a return of cancer that needs to be managed by them. 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 Nonetheless she does not remember exactly when her last PET scan was but it was not too recently she tells me. 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 The patient does have hypertension as well. She sees her oncologist next on December 6. 09/12/2020 on evaluation today patient actually appears to be doing excellent in regard to her wound under the right breast location. Currently this is measuring smaller in general seems to be doing very well. She tells me that the collagen is doing a good job and that the dressing that she has been putting on is not causing any troubles as far pulling on her skin or scar tissue is concerned all of which is excellent news. 10/03/2020 upon evaluation today patient appears to be doing well with regard to her surgical site from her mastectomy on the right breast region. She tells me that she has had very little bleeding nothing seems to be sticking badly and in general she has been extremely pleased with where things stand today. No fevers, chills, nausea, vomiting, or diarrhea. 10/24/2020 upon evaluation today patient actually appears to be doing excellent in regard to her wound. There is just a very small area still open and to be honest there was minimal slough noted on the surface of the wound nothing that requires sharp debridement. In general I feel like she is actually doing quite well and overall I am pleased. I do think we may  switch to silver alginate dressing and also contemplating using a AandE ointment in order to help  keep everything moist and the scar tissue region while the alginate keeps it dry enough to heal 11/14/2020 upon evaluation today patient appears to be doing well at this point in regard to her wound. I do feel like that she is making good progress again this is a very difficult region over the surgical site of the right chest wall at the site of mastectomy. Nonetheless I think that we are just a very small area still open I think alginate is doing a good job helping to dry this up and I would recommend this such that we continue with this. 01/02/2021 on evaluation today patient's wound actually appears to be doing decently well. There does not appear to be any signs of active infection which is great news. She does have some slight slough buildup with biofilm on the surface of the wound mainly more biofilm. Nonetheless I was able to gently remove this with saline and gauze as well as a sterile Q-tip. She tolerated that today without complication. [name], [name] ([id number]) [id number] Page 2 of 11 01/30/2021 upon evaluation today patient appears to be doing well with regard to her wound although she still has quite a bit of trouble getting this to close I think the scar tissue and radiation damage is quite significant here unfortunately. There does not appear to be any signs of active infection which is great news. No fevers, chills, nausea, vomiting, or diarrhea. 02/27/2021 upon evaluation today patient appears to be doing excellent in regard to her wound. She has been tolerating the dressing changes without complication and in general I am extremely pleased with where things stand today. There does not appear to be any signs of infection which is great news. No fevers, chills, nausea, vomiting, or diarrhea. With that being said I do think that she still developing some slough buildup. I did discuss with her today the possibility of proceeding with HBO therapy. We have had this  discussion before but she really is not quite committed to wanting to do that much and spend that much time in the chamber. She wants to still think about it. 03/27/2021 upon evaluation today patient appears to be doing about the same in regard to her wound. She still developing a lot of slough buildup on the surface of the wound. I did try to clean that away to some degree today. She tolerated that without any pain or complication. Subsequently I am thinking we may want to switch over to [facility] see if this may do better for her. Patient is in agreement with giving that a trial. 05/01/2021 upon evaluation today patient appears to be doing about the same in regard to her wounds. She has been tolerating the dressing changes without complication. Fortunately there does not appear to be any signs of active infection at this time which is great news. No fevers, chills, nausea, vomiting, or diarrhea.. 06/05/2021 upon evaluation today patient appears to be doing pretty well currently in regard to her wound at the mastectomy site right chest wall. Overall since we switch back to the alginate she tells me things have significantly improved she is much happier with where things stand currently. Fortunately there does not appear to be any signs of active infection which is great news. No fevers, chills, nausea, vomiting, or diarrhea. 07/10/2021 upon evaluation today patient unfortunately does not appear to be doing nearly as well as what she  previously was. There does not appear to be any evidence of new epithelial growth she has a lot of necrotic tissue the base of the wound this is much more significant than what we previously noted. She also has been having increased pain and increased drainage. In general I am very concerned about this especially in light of the fact that she does have a history of breast cancer I want to ensure that were not looking at any cancerous type lesion at this point. Otherwise also  think she does need some debridement we did do MolecuLight scanning today. 07/17/2021 upon evaluation today patient appears to be doing well with regard to her wound compared to last week although she still having some erythema I am concerned in this regard. I do think that she fortunately had a negative biopsy which is great news unfortunately she did have Pseudomonas noted as a bacteria present in the culture which I think is good and need to be addressed with Levaquin. I Minna send that into the pharmacy today. We did repeat the MolecuLight screening. 07/31/2021 upon evaluation today patient's wound is actually showing signs of improvement which is great news. Fortunately there does not appear to be any signs of infection currently locally nor systemically and I am very pleased in that regard. With that being said the patient is having some issues here with continued necrotic tissue I think packing with the Dakin's moistened gauze dressing is still in the right leg ago. 08/14/2021 upon evaluation today patient appears to be doing well with regard to her wound all things considered I do not see any signs of significant infection which is great news. Fortunately there does not appear to be any evidence of infection which is also great news. In general I think that the patient is tolerating the dressing changes well. We been using a Dakin's moistened gauze. 08/28/2021 upon evaluation today patient appears to be doing well with regard to her wound. Worsening signs of definite improvement which is great news and overall very pleased with where we stand today. There is no signs of inflamed tissue or infection which is great as well and I think the Dakin's is doing a great job here. 09/11/2021 upon evaluation today patient appears to be doing well with regard to her wound all things considered. I am can perform some debridement today to clear away some of the necrotic debris with that being said overall I  think that she is making decently good progress here which is great news. There does not appear to be any signs of active infection also good news. That in fact is probably the best news in her mind. 09/25/2021 upon evaluation today patient appears to be doing decently well in regard to her wound. Overall I do not see any signs of infection and I think she is doing quite well. I do believe that we are making headway here although is very slow due to the scarring and the depth of the wound this is a significantly deep wound. 10/16/2021 upon evaluation today patient appears to be doing about the same in regard to the wound on her right chest wall. Fortunately there does not appear to be any signs of active infection locally nor systemically which is great news. With that being said this still was not cleaning up quite as quickly as I would like to see. Fortunately I think that we can definitely give the Santyl another try we tried this in the past and unfortunately did  not have a good result but again I think she was infected at that time as well which was part of the issue. Nonetheless I think currently that we will be able to go ahead and see what we can do about getting this little cleaner with the Santyl. 11/06/2021 upon evaluation today patient appears to be doing well with regard to her wound. This actually appears to be a little bit better with the Santyl I am happy in that regard. Fortunately I do not see any signs of active infection at this time. No fevers, chills, nausea, vomiting, or diarrhea. 12/04/2021 upon evaluation patient appears to be doing well with the Santyl at this point. I am very pleased with where we stand and I think that she is making good progress here. Fortunately I do not see any evidence of active infection locally or systemically at this time which is great news. No fevers, chills, nausea, vomiting, or diarrhea. 01/01/2022 upon evaluation today patient actually is making  excellent progress with the Santyl. I am extremely pleased with where we stand today. I do not see any signs of infection. 01-29-2022 upon evaluation today patient appears to be doing well with regard to her wound. This is showing signs of some improvement. It is a little less deep than what it was. Size wise is also slightly smaller but again there still is a significant wound in this area. Fortunately I do not see any evidence of infection and she is doing quite well. 02-26-2022 upon evaluation today patient appears to be doing well with regard to her wound. In fact this is actually the best that I have seen in a very long time. I do not see any evidence of active infection at this time locally or systemically which is great news and overall I think we are on the right track. Nonetheless I do believe that the patient is continuing to show evidence of improvement with the Santyl week by week. 03-26-2022 upon evaluation today patient appears to be doing well with regard to the wound on her right mastectomy site. She is actually showing signs of excellent improvement in fact there was some bleeding just with a little bit of light cleaning over the area. Fortunately I do not see any signs of active infection locally or systemically at this time which is great news. 04-30-2022 upon evaluation today patient appears to be doing well currently in regard to her wound. She is actually showing some signs of improvement here which is great news. Still this is very very slow. Subsequently I do believe that she may benefit from the use of [place]stone topical antibiotics for short amount of time before applying a skin substitute I think EpiCord could be doing very well for her as far as that is concerned. I discussed that with her today. Fortunately I do not see any evidence of active infection locally or systemically at this time which is great news. No fevers, chills, nausea, vomiting, or diarrhea. 05-28-2022 upon  evaluation today patient's wound is actually showing signs of significant improvement. Fortunately I do not see any evidence of infection at this time which is great news. No fevers, chills, nausea, vomiting, or diarrhea. With that being said I do believe that the patient is tolerating the dressing changes without complication. We been using the [facility] which I think is helpful. We do have approval for the EpiCord. 06-11-2022 upon evaluation today patient appears to be doing well with regard to her wound she is actually  here today for the first application of EpiCord which [name], [name] ([id number]) [id number] Page 3 of 11 think is good to be beneficial for her. I do think that we will probably be able to keep this in place without can be the one thing of question to be honest. 06-18-2022 upon evaluation today patient actually appears to be doing excellent today. She has 1 treatment of the EpiCord underway and the wound surface already looks much better. This will be EpiCord #2 today. Fortunately I think we are on the right track here. 06-25-2022 upon evaluation today patient appears to be making good progress and overall the patient seems to have tolerated EpiCord without any complication. I do feel like that the wound is improving quite significantly. This is EpiCord #3 today. 07-02-2021 upon evaluation today patient's wound is actually showing signs of improvement. I am actually very pleased with where we stand we have been seeing some improvements in size overall and I think that we are still in the right track although there is a little bit of need for sharp debridement today to clearway some of the necrotic debris and remaining EpiCord which is actually still somewhat adhered to the wound bed. Overall I am extremely pleased though with where we stand. This is EpiCord #4 today. Upon evaluation today patient appears to be doing well with regard to her wound the EpiCord is  definitely helping and does seem to be improving the overall status of the wound the surface is dramatically improved compared to what it was at the start of this. The tissue is actually becoming more red than it is just a pale pink and to be honest some of the did not even appear to be pink in the start. Nonetheless I am very pleased with where we stand currently. No fevers, chills, nausea, vomiting, or diarrhea. 07-16-2022 upon evaluation today patient appears to be doing well currently in regard to her wound she is doing well with the EpiCord this is application #6 today. 07-23-2022 upon evaluation today patient's wound actually showing signs of excellent improvement which is great news. Fortunately I think were making progress again this is a very scarred area which now has a pretty good vascular supply which is great news. Overall I think that she is making great progress. This is EpiCord #7 today 10/18; Epicort No. 8. Very minimally smaller. Wounds on the right breast mastectomy site complicated by soft tissue radiation damage. Unfortunately she lives in [place], 5 days a week transport would be impossible for her to arrange 08-06-2022 upon evaluation today patient's wound is showing signs again of minimal improvement with regard to the wound bed again this is something that would probably respond well to hyperbaric oxygen therapy and I think should we can probably get this approved but she is just not able to make the transportation from [place] here daily for the 48-month period of time. Nonetheless I do think that she seems to be doing much better with regard to the surface of the wound and I am pleased that regard I am going to go ahead and continue with the EpiCord this is #9 application today. 08-13-2022 upon evaluation today patient's wound again is showing signs of improved quality to the tissue in the base of the wound. Fortunately I do not see any evidence of infection which is great  news and overall I am extremely pleased in that regard. In general I do believe that the patient is making progress here. No fevers,  chills, nausea, vomiting, or diarrhea. She is here today for EpiCord application #10 11/8; patient with a wound on the lower part of the right breast area. She is apparently had a nice response to epi cord but she has completed this. It is difficult not to look over this lady's history and look at the wound and wonder about the known benefits of hyperbaric oxygen for soft tissue radionecrosis however she is unable to make transportation arrangements. She lives in [place] 08-27-2022 upon evaluation today patient appears to be doing well in regard to her wound. She has been tolerating the dressing changes without complication wheezing using the silver collagen over the past week and she tolerated that without complication. Fortunately I see no evidence of active infection locally or systemically at this time. 09-17-2022 upon evaluation today patient appears to be doing well currently in regard to her wound. She has been tolerating the dressing changes without complication. Fortunately there does not appear to be any signs of active infection locally or systemically at this time which is great news. No fevers, chills, nausea, vomiting, or diarrhea. 10-15-2022 upon evaluation today patient appears to be doing well currently in regard to her wound. This is showing signs of good improvement which is great news and overall I am extremely pleased with where we stand today. Fortunately there does not appear to be any signs of active infection at this time which is great news as well. No fevers, chills, nausea, vomiting, or diarrhea. 11-12-2022 upon evaluation today patient appears to be doing pretty well in regard to her wound. She has been tolerating the dressing changes without complication and fortunately there does not appear to be any signs of active infection locally nor  systemically which is great news. No fevers, chills, nausea, vomiting, or diarrhea. 12-10-2022 upon evaluation today patient appears to be doing well currently in regard to her wound. She has been tolerating the dressing changes without complication. Fortunately there does not appear to be any signs of active infection locally nor systemically which is great news. No fevers, chills, nausea, vomiting, or diarrhea. 01-07-2023 upon evaluation today patient appears to be doing actually better in regard to her wound I am actually very pleased with where things stand. The size is about the same but the base of the wound actually appears to be healthier with more red granulation tissue and there is some slough and biofilm I am going to perform debridement to clear some of this wound today for continuing with collagen at this point she is doing a good job taking care of this. 02-04-2023 upon evaluation today patient appears to be doing well currently in regard to her wound. She has been tolerating the dressing changes without complication in general I really feel like they were making excellent progress here. Fortunately I do not see any signs of active infection locally or systemically which is great news. She is going require some debridement but overall I think that though slow this still continues to make some progress little by little. 03-11-2023 upon evaluation today patient's wound is doing roughly about the same. I do not see any signs of overall worsening which is great news and I am very pleased in that regard. With that being said I do believe that she does have a issue here with ongoing radiation damage to this region which is making it very hard to get the close but again we will continue to work as best we can on this. 04-08-2023 upon evaluation  today patient appears to be doing well currently in regard to her wound. She has been tolerating the dressing changes without complication. Fortunately I do  not see any signs of active infection locally or systemically at this time. 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 She apparently ran a course of epi cord without any improvement now has been on silver collagen for a while without obvious improvement either at least in terms of wound dimensions. She has no other complaints today. She changes her dressing every second day 06-03-2023 upon evaluation today patient appears to be doing well currently in regard to her wound. She has been tolerating the dressing changes without complication. Fortunately I do not see any signs of active infection locally nor systemically at this time. 07-01-2023 upon evaluation today patient appears to be doing pretty well in regard to her wound in the right breast location where she has had a right breast removal. Subsequently she tells me that she is continuing to do well with the dressings at this location which is good news. With that being said unfortunately she does have issues of swelling in the right lower extremity. She also has a wound on the medial ankle very superficial but nonetheless that is a concern at this point. I discussed with the patient that I do believe that we can manage this for her at this point to get this healed. She voiced understanding. With that being said I am going to see about getting her initiated with a compression wrap. I think that is probably good to be the best option here for her. 07-08-2023 upon evaluation today patient appears to be doing well currently in regard to her wounds. She has been tolerating the dressing changes without complication. Fortunately I do not see any evidence of worsening overall I do believe that the patient is making good headway towards complete closure. [name], [name] ([id number]) [id number] Page 4 of 11 07/15/2023 upon evaluation today patient  appears to be doing excellent in regard to her right leg ulcer this is completely healed. I am actually very pleased with where things stand currently. 08-12-2023 upon evaluation today patient appears to be doing well currently in regard to her wound which is actually showing some signs of improvement. Fortunately I do not see any evidence of worsening overall believe that the patient is making good headway towards closure which is good news. Electronic Signature(s) Signed: 08/12/2023 10:46:41 AM By: Allen Derry PA-C Entered By: Allen Derry on 08/12/2023 07:46:41 -------------------------------------------------------------------------------- Physical Exam Details Patient Name: Date of Service: [name], [name] 08/12/2023 9:30 A M Medical Record Number: [id number] Patient Account Number: [id number] Date of Birth/Sex: Treating RN: Nov 11, 1950 (72 y.o. F) Primary Care Provider: Charlott Rakes Other Clinician: Referring Provider: Treating Provider/Extender: Roxanna Mew, [name]: 155 Constitutional [name] and well-hydrated in no acute distress. Respiratory normal breathing without difficulty. Psychiatric this patient is able to make decisions and demonstrates good insight into disease process. Alert and Oriented x 3. pleasant and cooperative. Notes Upon inspection patient's wound bed actually showed signs of good granulation epithelization at this point. Fortunately I do not see any evidence of worsening overall and in general I think that we are making good headway here towards complete closure. Electronic Signature(s) Signed: 08/12/2023 10:46:56 AM By: Allen Derry PA-C Entered By: Allen Derry on 08/12/2023 07:46:56 -------------------------------------------------------------------------------- Physician Orders Details Patient Name: Date of Service: GO  INS, [name] 08/12/2023 9:30 A M Medical Record Number: [id number] Patient Account Number:  [id number] Date of Birth/Sex: Treating RN: January 04, 1951 (72 y.o. [name] Primary Care Provider: Charlott Rakes Other Clinician: Referring Provider: Treating Provider/Extender: Roxanna Mew, [name]: 155 The following information was scribed by: Shawn Stall The information was scribed for: Lenda Kelp Verbal / Phone Orders: No Diagnosis Coding ICD-10 Coding Code Description [phone] Non-pressure chronic ulcer of skin of other sites with fat layer exposed L58.9 Radiodermatitis, unspecified L59.8 Other specified disorders of the skin and subcutaneous tissue related to radiation I87.331 Chronic venous hypertension (idiopathic) with ulcer and inflammation of right lower extremity [name] ([id number]) [id number] Page 5 of 11 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 Allen Derry, PA Room 8 (front office to schedule) 09/09/2023 Other: - wear compression stockings daily Anesthetic (In clinic) Topical Lidocaine 4% applied to wound bed Cellular or Tissue Based Products daptic or Mepitel. (DO NOT REMOVE). - Cellular or Tissue Based Product applied to wound bed, secured with steri-strips, cover with A Already Done- Epicord #1 06/11/22, #2 06/18/22, #3 06/25/22, #4 07/02/22, #5 07/09/22, #6 07/16/22, Epicord #7 07/23/22, Epicord #8 07/30/22, Epicord #9 08/06/2022. Epicord # 10 08/13/22 - no more after 10th application, per PA we will see how she does after this with collagen for now. Bathing/ Shower/ Hygiene May shower with protection but do not get wound dressing(s) wet. Protect dressing(s) with water repellant cover (for example, large plastic bag) or a cast cover and may then take shower. Additional Orders / Instructions Follow Nutritious Diet Wound Treatment Wound #1 - Breast (mastectomy site)  Wound Laterality: Right Cleanser: Wound Cleanser (Generic) Every Other Day/30 Days Discharge Instructions: Cleanse the wound with wound cleanser prior to applying a clean dressing using gauze sponges, not tissue or cotton balls. Cleanser: [place]ram Ancillary Kit - 15 Day Supply (Generic) Every Other Day/30 Days Discharge Instructions: Use supplies as instructed; Kit contains: (15) Saline Bullets; (15) 3x3 Gauze; 15 pr Gloves Cleanser: Soap and Water Every Other Day/30 Days Discharge Instructions: May shower and wash wound with dial antibacterial soap and water prior to dressing change. Peri-Wound Care: Skin Prep (Generic) Every Other Day/30 Days Discharge Instructions: Use skin prep as directed Prim Dressing: Hydrofera Blue Ready Transfer Foam, 2.5x2.5 (in/in) (Dispense As Written) Every Other Day/30 Days ary Discharge Instructions: Apply directly to wound bed as directed Secondary Dressing: Woven Gauze Sponge, Non-Sterile 4x4 in (Generic) Every Other Day/30 Days Discharge Instructions: Apply over primary dressing as directed. Secondary Dressing: Woven Gauze Sponges 2x2 in (Generic) Every Other Day/30 Days Discharge Instructions: Apply over primary dressing, fold in corners to fill the space Secondary Dressing: Zetuvit Plus Silicone Border Dressing 5x5 (in/in) (DME) (Generic) Every Other Day/30 Days Discharge Instructions: Apply silicone border over primary dressing as directed. Electronic Signature(s) Signed: 08/12/2023 4:33:40 PM By: Allen Derry PA-C Signed: 08/13/2023 6:03:32 PM By: Shawn Stall RN, BSN Entered By: Shawn Stall on 08/12/2023 07:22:58 -------------------------------------------------------------------------------- Problem List Details Patient Name: Date of Service: [name], [name] 08/12/2023 9:30 A M Medical Record Number: [id number] Patient Account Number: [id number] Date of Birth/Sex: Treating RN: 02-May-1951 (72 y.o. F) Primary Care Provider: Charlott Rakes Other  Clinician: Referring Provider: Treating Provider/Extender: Roxanna Mew, Christiana Fuchs in Treatment: [address] 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 She had a mastectomy initially in 1998. She had chemotherapy only no radiation following. In 2002 she subsequently did undergo radiation for the first time and  then subsequently in 2012 had repeat radiation after having had a finding that was consistent with a relapse of the cancer. 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 She also has radiation damage to the skin over the right chest wall where she had the mastectomy. She was referred to [place] by Dr. Marcelle Overlie in [facility] and her oncologist is Dr. Arlan Organ. Subsequently I want to make sure before we delve into this more deeply that the patient does not have any issues with a return of cancer that needs to be managed by them. 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 Nonetheless she does not remember exactly when her last PET scan was but it was not too recently she tells me. 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 The patient does have hypertension as well. She sees her oncologist next on December 6. 09/12/2020 on evaluation today patient actually appears to be doing excellent in regard to her wound under the right breast location. Currently this is measuring smaller in general seems to be doing very well. She tells me that the collagen is doing a good job and that the dressing that she has been putting on is not causing any troubles as far pulling on her skin or scar tissue is concerned all of which is excellent news. 10/03/2020 upon evaluation today patient appears to be doing well with regard to her surgical site from her mastectomy on the right breast region. She tells me that she has had very little bleeding nothing seems to be sticking badly and in general she has been extremely pleased with where things stand today. No fevers, chills, nausea, vomiting, or diarrhea. SYERRA, ABDELRAHMAN ([id number]) [id number] Page 7 of 11 10/24/2020 upon evaluation today  patient actually appears to be doing excellent in regard to her wound. There is just a very small area still open and to be honest there was minimal slough noted on the surface of the wound nothing that requires  sharp debridement. In general I feel like she is actually doing quite well and overall I am pleased. I do think we may switch to silver alginate dressing and also contemplating using a AandE ointment in order to help keep everything moist and the scar tissue region while the alginate keeps it dry enough to heal 11/14/2020 upon evaluation today patient appears to be doing well at this point in regard to her wound. I do feel like that she is making good progress again this is a very difficult region over the surgical site of the right chest wall at the site of mastectomy. Nonetheless I think that we are just a very small area still open I think alginate is doing a good job helping to dry this up and I would recommend this such that we continue with this. 01/02/2021 on evaluation today patient's wound actually appears to be doing decently well. There does not appear to be any signs of active infection which is great news. She does have some slight slough buildup with biofilm on the surface of the wound mainly more biofilm. Nonetheless I was able to gently remove this with saline and gauze as well as a sterile Q-tip. She tolerated that today without complication. 01/30/2021 upon evaluation today patient appears to be doing well with regard to her wound although she still has quite a bit of trouble getting this to close I think the scar tissue and radiation damage is quite significant here unfortunately. There does not appear to be any signs of active infection which is great news. No fevers, chills, nausea, vomiting, or diarrhea. 02/27/2021 upon evaluation today patient appears to be doing excellent in regard to her wound. She has been tolerating the dressing changes without complication and in general  I am extremely pleased with where things stand today. There does not appear to be any signs of infection which is great news. No fevers, chills, nausea, vomiting, or diarrhea. With that being said I do think that she still developing some slough buildup. I did discuss with her today the possibility of proceeding with HBO therapy. We have had this discussion before but she really is not quite committed to wanting to do that much and spend that much time in the chamber. She wants to still think about it. 03/27/2021 upon evaluation today patient appears to be doing about the same in regard to her wound. She still developing a lot of slough buildup on the surface of the wound. I did try to clean that away to some degree today. She tolerated that without any pain or complication. Subsequently I am thinking we may want to switch over to [facility] see if this may do better for her. Patient is in agreement with giving that a trial. 05/01/2021 upon evaluation today patient appears to be doing about the same in regard to her wounds. She has been tolerating the dressing changes without complication. Fortunately there does not appear to be any signs of active infection at this time which is great news. No fevers, chills, nausea, vomiting, or diarrhea.. 06/05/2021 upon evaluation today patient appears to be doing pretty well currently in regard to her wound at the mastectomy site right chest wall. Overall since we switch back to the alginate she tells me things have significantly improved she is much happier with where things stand currently. Fortunately there does not appear to be any signs of active infection which is great news. No fevers, chills, nausea, vomiting, or diarrhea. 07/10/2021 upon evaluation today  patient unfortunately does not appear to be doing nearly as well as what she previously was. There does not appear to be any evidence of new epithelial growth she has a lot of necrotic tissue the base of the  wound this is much more significant than what we previously noted. She also has been having increased pain and increased drainage. In general I am very concerned about this especially in light of the fact that she does have a history of breast cancer I want to ensure that were not looking at any cancerous type lesion at this point. Otherwise also think she does need some debridement we did do MolecuLight scanning today. 07/17/2021 upon evaluation today patient appears to be doing well with regard to her wound compared to last week although she still having some erythema I am concerned in this regard. I do think that she fortunately had a negative biopsy which is great news unfortunately she did have Pseudomonas noted as a bacteria present in the culture which I think is good and need to be addressed with Levaquin. I Minna send that into the pharmacy today. We did repeat the MolecuLight screening. 07/31/2021 upon evaluation today patient's wound is actually showing signs of improvement which is great news. Fortunately there does not appear to be any signs of infection currently locally nor systemically and I am very pleased in that regard. With that being said the patient is having some issues here with continued necrotic tissue I think packing with the Dakin's moistened gauze dressing is still in the right leg ago. 08/14/2021 upon evaluation today patient appears to be doing well with regard to her wound all things considered I do not see any signs of significant infection which is great news. Fortunately there does not appear to be any evidence of infection which is also great news. In general I think that the patient is tolerating the dressing changes well. We been using a Dakin's moistened gauze. 08/28/2021 upon evaluation today patient appears to be doing well with regard to her wound. Worsening signs of definite improvement which is great news and overall very pleased with where we stand today.  There is no signs of inflamed tissue or infection which is great as well and I think the Dakin's is doing a great job here. 09/11/2021 upon evaluation today patient appears to be doing well with regard to her wound all things considered. I am can perform some debridement today to clear away some of the necrotic debris with that being said overall I think that she is making decently good progress here which is great news. There does not appear to be any signs of active infection also good news. That in fact is probably the best news in her mind. 09/25/2021 upon evaluation today patient appears to be doing decently well in regard to her wound. Overall I do not see any signs of infection and I think she is doing quite well. I do believe that we are making headway here although is very slow due to the scarring and the depth of the wound this is a significantly deep wound. 10/16/2021 upon evaluation today patient appears to be doing about the same in regard to the wound on her right chest wall. Fortunately there does not appear to be any signs of active infection locally nor systemically which is great news. With that being said this still was not cleaning up quite as quickly as I would like to see. Fortunately I think that we can definitely  give the Santyl another try we tried this in the past and unfortunately did not have a good result but again I think she was infected at that time as well which was part of the issue. Nonetheless I think currently that we will be able to go ahead and see what we can do about getting this little cleaner with the Santyl. 11/06/2021 upon evaluation today patient appears to be doing well with regard to her wound. This actually appears to be a little bit better with the Santyl I am happy in that regard. Fortunately I do not see any signs of active infection at this time. No fevers, chills, nausea, vomiting, or diarrhea. 12/04/2021 upon evaluation patient appears to be doing  well with the Santyl at this point. I am very pleased with where we stand and I think that she is making good progress here. Fortunately I do not see any evidence of active infection locally or systemically at this time which is great news. No fevers, chills, nausea, vomiting, or diarrhea. 01/01/2022 upon evaluation today patient actually is making excellent progress with the Santyl. I am extremely pleased with where we stand today. I do not see any signs of infection. 01-29-2022 upon evaluation today patient appears to be doing well with regard to her wound. This is showing signs of some improvement. It is a little less deep than what it was. Size wise is also slightly smaller but again there still is a significant wound in this area. Fortunately I do not see any evidence of infection and she is doing quite well. 02-26-2022 upon evaluation today patient appears to be doing well with regard to her wound. In fact this is actually the best that I have seen in a very long time. I do not see any evidence of active infection at this time locally or systemically which is great news and overall I think we are on the right track. Nonetheless I do believe that the patient is continuing to show evidence of improvement with the Santyl week by week. 03-26-2022 upon evaluation today patient appears to be doing well with regard to the wound on her right mastectomy site. She is actually showing signs of SHALLA, BULLUCK ([id number]) [id number] Page 8 of 11 excellent improvement in fact there was some bleeding just with a little bit of light cleaning over the area. Fortunately I do not see any signs of active infection locally or systemically at this time which is great news. 04-30-2022 upon evaluation today patient appears to be doing well currently in regard to her wound. She is actually showing some signs of improvement here which is great news. Still this is very very slow. Subsequently I do  believe that she may benefit from the use of Keystone topical antibiotics for short amount of time before applying a skin substitute I think EpiCord could be doing very well for her as far as that is concerned. I discussed that with her today. Fortunately I do not see any evidence of active infection locally or systemically at this time which is great news. No fevers, chills, nausea, vomiting, or diarrhea. 05-28-2022 upon evaluation today patient's wound is actually showing signs of significant improvement. Fortunately I do not see any evidence of infection at this time which is great news. No fevers, chills, nausea, vomiting, or diarrhea. With that being said I do believe that the patient is tolerating the dressing changes without complication. We been using the [facility] which I think is helpful. We do have  approval for the EpiCord. 06-11-2022 upon evaluation today patient appears to be doing well with regard to her wound she is actually here today for the first application of EpiCord which I think is good to be beneficial for her. I do think that we will probably be able to keep this in place without can be the one thing of question to be honest. 06-18-2022 upon evaluation today patient actually appears to be doing excellent today. She has 1 treatment of the EpiCord underway and the wound surface already looks much better. This will be EpiCord #2 today. Fortunately I think we are on the right track here. 06-25-2022 upon evaluation today patient appears to be making good progress and overall the patient seems to have tolerated EpiCord without any complication. I do feel like that the wound is improving quite significantly. This is EpiCord #3 today. 07-02-2021 upon evaluation today patient's wound is actually showing signs of improvement. I am actually very pleased with where we stand we have been seeing some improvements in size overall and I think that we are still in the right track although there is a  little bit of need for sharp debridement today to clearway some of the necrotic debris and remaining EpiCord which is actually still somewhat adhered to the wound bed. Overall I am extremely pleased though with where we stand. This is EpiCord #4 today. Upon evaluation today patient appears to be doing well with regard to her wound the EpiCord is definitely helping and does seem to be improving the overall status of the wound the surface is dramatically improved compared to what it was at the start of this. The tissue is actually becoming more red than it is just a pale pink and to be honest some of the did not even appear to be pink in the start. Nonetheless I am very pleased with where we stand currently. No fevers, chills, nausea, vomiting, or diarrhea. 07-16-2022 upon evaluation today patient appears to be doing well currently in regard to her wound she is doing well with the EpiCord this is application #6 today. 07-23-2022 upon evaluation today patient's wound actually showing signs of excellent improvement which is great news. Fortunately I think were making progress again this is a very scarred area which now has a pretty good vascular supply which is great news. Overall I think that she is making great progress. This is EpiCord #7 today 10/18; Epicort No. 8. Very minimally smaller. Wounds on the right breast mastectomy site complicated by soft tissue radiation damage. Unfortunately she lives in [place], 5 days a week transport would be impossible for her to arrange 08-06-2022 upon evaluation today patient's wound is showing signs again of minimal improvement with regard to the wound bed again this is something that would probably respond well to hyperbaric oxygen therapy and I think should we can probably get this approved but she is just not able to make the transportation from [place] here daily for the 62-month period of time. Nonetheless I do think that she seems to be doing much better  with regard to the surface of the wound and I am pleased that regard I am going to go ahead and continue with the EpiCord this is #9 application today. 08-13-2022 upon evaluation today patient's wound again is showing signs of improved quality to the tissue in the base of the wound. Fortunately I do not see any evidence of infection which is great news and overall I am extremely pleased in that regard.  In general I do believe that the patient is making progress here. No fevers, chills, nausea, vomiting, or diarrhea. She is here today for EpiCord application #10 11/8; patient with a wound on the lower part of the right breast area. She is apparently had a nice response to epi cord but she has completed this. It is difficult not to look over this lady's history and look at the wound and wonder about the known benefits of hyperbaric oxygen for soft tissue radionecrosis however she is unable to make transportation arrangements. She lives in [place] 08-27-2022 upon evaluation today patient appears to be doing well in regard to her wound. She has been tolerating the dressing changes without complication wheezing using the silver collagen over the past week and she tolerated that without complication. Fortunately I see no evidence of active infection locally or systemically at this time. 09-17-2022 upon evaluation today patient appears to be doing well currently in regard to her wound. She has been tolerating the dressing changes without complication. Fortunately there does not appear to be any signs of active infection locally or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

## 2023-08-14 NOTE — Progress Notes (Signed)
[name], [name] ([id number]) [id number] Page 1 of 7 Visit Report for 08/12/2023 Arrival Information Details Patient Name: Date of Service: [name] 08/12/2023 9:30 A M Medical Record Number: [id number] Patient Account Number: [id number] Date of Birth/Sex: Treating RN: 1951/07/14 (72 y.o. F) Primary Care Elanie Hammitt: Charlott Rakes Other Clinician: Referring Kallin Henk: Treating Zaine Elsass/Extender: Roxanna Mew, [name]: 155 Visit Information History Since Last Visit Added or deleted any medications: No Patient Arrived: Ambulatory Any new allergies or adverse reactions: No Arrival Time: 09:50 Had a fall or experienced change in No Accompanied By: self activities of daily living that may affect Transfer Assistance: None risk of falls: Patient Identification Verified: Yes Signs or symptoms of abuse/neglect since last visito No Secondary Verification Process Completed: Yes Hospitalized since last visit: No Patient Requires Transmission-Based Precautions: No Implantable device outside of the clinic excluding No Patient Has Alerts: No cellular tissue based products placed in the center since last visit: Has Dressing in Place as Prescribed: Yes Pain Present Now: No Electronic Signature(s) Signed: 08/12/2023 4:40:46 PM By: Thayer Dallas Entered By: Thayer Dallas on 08/12/2023 06:50:54 -------------------------------------------------------------------------------- Clinic Level of Care Assessment Details Patient Name: Date of Service: [name], [name] 08/12/2023 9:30 A M Medical Record Number: [id number] Patient Account Number: [id number] Date of Birth/Sex: Treating RN: [birth year] (72 y.o. [name] Primary Care Shellie Rogoff: Charlott Rakes Other Clinician: Referring Anan Dapolito: Treating Helayna Dun/Extender: Roxanna Mew, [name]: 155 Clinic Level of Care Assessment Items TOOL 4 Quantity  Score X- 1 0 Use when only an EandM is performed on FOLLOW-UP visit ASSESSMENTS - Nursing Assessment / Reassessment X- 1 10 Reassessment of Co-morbidities (includes updates in patient status) X- 1 5 Reassessment of Adherence to Treatment Plan ASSESSMENTS - Wound and Skin A ssessment / Reassessment X - Simple Wound Assessment / Reassessment - one wound 1 5 []  - 0 Complex Wound Assessment / Reassessment - multiple wounds []  - 0 Dermatologic / Skin Assessment (not related to wound area) ASSESSMENTS - Focused Assessment []  - 0 Circumferential Edema Measurements - multi extremities []  - 0 Nutritional Assessment / Counseling / Intervention [name], [name] ([id number]) [id number] Page 2 of 7 []  - 0 Lower Extremity Assessment (monofilament, tuning fork, pulses) []  - 0 Peripheral Arterial Disease Assessment (using hand held doppler) ASSESSMENTS - Ostomy and/or Continence Assessment and Care []  - 0 Incontinence Assessment and Management []  - 0 Ostomy Care Assessment and Management (repouching, etc.) PROCESS - Coordination of Care X - Simple Patient / Family Education for ongoing care 1 15 []  - 0 Complex (extensive) Patient / Family Education for ongoing care X- 1 10 Staff obtains Chiropractor, Records, T Results / Process Orders est []  - 0 Staff telephones HHA, Nursing Homes / Clarify orders / etc []  - 0 Routine Transfer to another Facility (non-emergent condition) []  - 0 Routine Hospital Admission (non-emergent condition) []  - 0 New Admissions / Manufacturing engineer / Ordering NPWT Apligraf, etc. , []  - 0 Emergency Hospital Admission (emergent condition) X- 1 10 Simple Discharge Coordination []  - 0 Complex (extensive) Discharge Coordination PROCESS - Special Needs []  - 0 Pediatric / Minor Patient Management []  - 0 Isolation Patient Management []  - 0 Hearing / Language / Visual special needs []  - 0 Assessment of Community assistance  (transportation, D/C planning, etc.) []  - 0 Additional assistance / Altered mentation []  - 0 Support Surface(s) Assessment (bed, cushion, seat, etc.) INTERVENTIONS - Wound Cleansing / Measurement X - Simple Wound Cleansing - one  wound 1 5 []  - 0 Complex Wound Cleansing - multiple wounds X- 1 5 Wound Imaging (photographs - any number of wounds) []  - 0 Wound Tracing (instead of photographs) X- 1 5 Simple Wound Measurement - one wound []  - 0 Complex Wound Measurement - multiple wounds INTERVENTIONS - Wound Dressings X - Small Wound Dressing one or multiple wounds 1 10 []  - 0 Medium Wound Dressing one or multiple wounds []  - 0 Large Wound Dressing one or multiple wounds []  - 0 Application of Medications - topical []  - 0 Application of Medications - injection INTERVENTIONS - Miscellaneous []  - 0 External ear exam []  - 0 Specimen Collection (cultures, biopsies, blood, body fluids, etc.) []  - 0 Specimen(s) / Culture(s) sent or taken to Lab for analysis []  - 0 Patient Transfer (multiple staff / Nurse, adult / Similar devices) []  - 0 Simple Staple / Suture removal (25 or less) []  - 0 Complex Staple / Suture removal (26 or more) []  - 0 Hypo / Hyperglycemic Management (close monitor of Blood Glucose) [name] ([id number]) [id number] Page 3 of 7 []  - 0 Ankle / Brachial Index (ABI) - do not check if billed separately X- 1 5 Vital Signs Has the patient been seen at the hospital within the last three years: Yes Total Score: 85 Level Of Care: New/Established - Level 3 Electronic Signature(s) Signed: 08/13/2023 6:03:32 PM By: Shawn Stall RN, BSN Entered By: Shawn Stall on 08/12/2023 07:54:32 -------------------------------------------------------------------------------- Encounter Discharge Information Details Patient Name: Date of Service: [name], [name] 08/12/2023 9:30 A M Medical Record Number: [id number] Patient Account Number: [id number] Date  of Birth/Sex: Treating RN: 01-28-1951 (72 y.o. [name] Primary Care Naylani Bradner: Charlott Rakes Other Clinician: Referring Nazair Fortenberry: Treating Dusty Raczkowski/Extender: Roxanna Mew, [name]: [phone] Encounter Discharge Information Items Discharge Condition: Stable Ambulatory Status: Ambulatory Discharge Destination: Home Transportation: Private Auto Accompanied By: self Schedule Follow-up Appointment: Yes Clinical Summary of Care: Electronic Signature(s) Signed: 08/13/2023 6:03:32 PM By: Shawn Stall RN, BSN Entered By: Shawn Stall on 08/12/2023 07:55:39 -------------------------------------------------------------------------------- Lower Extremity Assessment Details Patient Name: Date of Service: [name], [name] 08/12/2023 9:30 A M Medical Record Number: [id number] Patient Account Number: [id number] Date of Birth/Sex: Treating RN: 09-Dec-1950 (72 y.o. F) Primary Care Yonatan Guitron: Charlott Rakes Other Clinician: Referring Alyha Marines: Treating Toniyah Dilmore/Extender: Roxanna Mew, [name]: 155 Electronic Signature(s) Signed: 08/12/2023 4:40:46 PM By: Thayer Dallas Entered By: Thayer Dallas on 08/12/2023 06:51:37 -------------------------------------------------------------------------------- Multi-Disciplinary Care Plan Details Patient Name: Date of Service: [name], [name] 08/12/2023 9:30 A M Medical Record Number: [id number] Patient Account Number: [id number] [name], [name] ([id number]) [id number].pdf Page 4 of 7 Date of Birth/Sex: Treating RN: 30-Apr-1951 (72 y.o. [name] Primary Care Sebasthian Stailey: Other Clinician: Charlott Rakes Referring Dashae Wilcher: Treating Sindee Stucker/Extender: Roxanna Mew, Christiana Fuchs in Treatment: 155 Multidisciplinary Care Plan reviewed with physician Active Inactive Wound/Skin Impairment Nursing Diagnoses: Impaired tissue integrity Knowledge deficit  related to ulceration/compromised skin integrity Goals: Patient/caregiver will verbalize understanding of skin care regimen Date Initiated: 08/22/2020 Target Resolution Date: 10/09/2023 Goal Status: Active Ulcer/skin breakdown will have a volume reduction of 30% by week 4 Date Initiated: 08/22/2020 Date Inactivated: 10/03/2020 Target Resolution Date: 09/19/2020 Goal Status: Met Ulcer/skin breakdown will have a volume reduction of 50% by week 8 Date Initiated: 06/05/2021 Date Inactivated: 07/17/2021 Target Resolution Date: 07/03/2021 Goal Status: Unmet Unmet Reason: infection Interventions: Assess patient/caregiver ability to obtain necessary supplies Assess patient/caregiver ability to perform ulcer/skin care regimen  upon admission and as needed Assess ulceration(s) every visit Treatment Activities: Skin care regimen initiated : 08/22/2020 Topical wound management initiated : 08/22/2020 Notes: 06/05/21: Wound care regimen ongoing. 06/11/22: Wound care regimen continues, applying skin sub ([place]) Electronic Signature(s) Signed: 08/13/2023 6:03:32 PM By: Shawn Stall RN, BSN Entered By: Shawn Stall on 08/12/2023 07:14:49 -------------------------------------------------------------------------------- Pain Assessment Details Patient Name: Date of Service: [name], [name] 08/12/2023 9:30 A M Medical Record Number: [id number] Patient Account Number: [id number] Date of Birth/Sex: Treating RN: May 15, 1951 (72 y.o. F) Primary Care Ying Blankenhorn: Charlott Rakes Other Clinician: Referring Geovana Gebel: Treating Slyvia Lartigue/Extender: Roxanna Mew, [place] Weeks in Treatment: 155 Active Problems Location of Pain Severity and Description of Pain Patient Has Paino No Site Locations [place], [place] ([id number]) [id number] Page 5 of 7 Pain Management and Medication Current Pain Management: Electronic Signature(s) Signed: 08/12/2023 4:40:46 PM By: Thayer Dallas Entered By: Thayer Dallas on 08/12/2023 06:51:19 -------------------------------------------------------------------------------- Patient/Caregiver Education Details Patient Name: Date of Service: [name], [name] 10/30/2024andnbsp9:30 A M Medical Record Number: [id number] Patient Account Number: [id number] Date of Birth/Gender: Treating RN: 1951/05/02 (72 y.o. [name] Primary Care Physician: Charlott Rakes Other Clinician: Referring Physician: Treating Physician/Extender: Roxanna Mew, Christiana Fuchs in Treatment: 155 Education Assessment Education Provided To: Patient Education Topics Provided Wound/Skin Impairment: Handouts: Caring for Your Ulcer Methods: Explain/Verbal Responses: Reinforcements needed Electronic Signature(s) Signed: 08/13/2023 6:03:32 PM By: Shawn Stall RN, BSN Entered By: Shawn Stall on 08/12/2023 07:15:00 -------------------------------------------------------------------------------- Wound Assessment Details Patient Name: Date of Service: [name], [name] 08/12/2023 9:30 A M Medical Record Number: [id number] Patient Account Number: [id number] Date of Birth/Sex: Treating RN: [birth year] (72 y.o. F) Primary Care Anyia Gierke: Charlott Rakes Other Clinician: Clydell [name] ([id number]) [id number] Page 6 of 7 Referring Argelia Formisano: Treating Achille Xiang/Extender: Roxanna Mew, [name]: 155 Wound Status Wound Number: 1 Primary Soft Tissue Radionecrosis Etiology: Wound Location: Right Breast (mastectomy site) Wound Open Wounding Event: Radiation Burn Status: Date Acquired: 07/26/2020 Comorbid Anemia, Lymphedema, Deep Vein Thrombosis, Hypertension, Weeks Of Treatment: 155 History: Peripheral Venous Disease, Osteoarthritis, Received Clustered Wound: No Chemotherapy, Received Radiation, Confinement Anxiety Photos Wound Measurements Length: (cm) 1.8 Width: (cm) 2.7 Depth: (cm)  0.5 Area: (cm) 3.817 Volume: (cm) 1.909 % Reduction in Area: -149.2% % Reduction in Volume: -1147.7% Epithelialization: Small (1-33%) Tunneling: No Undermining: No Wound Description Classification: Full Thickness Without Exposed Suppo Wound Margin: Epibole Exudate Amount: Medium Exudate Type: Serosanguineous Exudate Color: red, brown rt Structures Foul Odor After Cleansing: No Slough/Fibrino Yes Wound Bed Granulation Amount: Medium (34-66%) Exposed Structure Granulation Quality: Pink, Pale Fascia Exposed: No Necrotic Amount: Medium (34-66%) Fat Layer (Subcutaneous Tissue) Exposed: Yes Necrotic Quality: Adherent Slough Tendon Exposed: No Muscle Exposed: No Joint Exposed: No Bone Exposed: No Periwound Skin Texture Texture Color No Abnormalities Noted: Yes No Abnormalities Noted: Yes Moisture Temperature / Pain No Abnormalities Noted: Yes Temperature: No Abnormality Treatment Notes Wound #1 (Breast (mastectomy site)) Wound Laterality: Right Cleanser Wound Cleanser Discharge Instruction: Cleanse the wound with wound cleanser prior to applying a clean dressing using gauze sponges, not tissue or cotton balls. Byram Ancillary Kit - 15 Day Supply Discharge Instruction: Use supplies as instructed; Kit contains: (15) Saline Bullets; (15) 3x3 Gauze; 15 pr Gloves Soap and Water Discharge Instruction: May shower and wash wound with dial antibacterial soap and water prior to dressing change. Peri-Wound Care Skin Prep Discharge Instruction: Use skin prep as directed Topical [name], [name] ([id number]) [phone].pdf Page 7 of 7 Primary Dressing Hydrofera Blue Ready  Transfer Foam, 2.5x2.5 (in/in) Discharge Instruction: Apply directly to wound bed as directed Secondary Dressing Woven Gauze Sponge, Non-Sterile 4x4 in Discharge Instruction: Apply over primary dressing as directed. Woven Gauze Sponges 2x2 in Discharge Instruction: Apply over primary dressing,  fold in corners to fill the space Zetuvit Plus Silicone Border Dressing 5x5 (in/in) Discharge Instruction: Apply silicone border over primary dressing as directed. Secured With Compression Wrap Compression Stockings Facilities manager) Signed: 08/12/2023 4:40:46 PM By: Thayer Dallas Entered By: Thayer Dallas on 08/12/2023 07:04:47 -------------------------------------------------------------------------------- Vitals Details Patient Name: Date of Service: [name], [name] 08/12/2023 9:30 A M Medical Record Number: [id number] Patient Account Number: [id number] Date of Birth/Sex: Treating RN: 09-22-1951 (72 y.o. F) Primary Care Lyden Redner: Charlott Rakes Other Clinician: Referring Kendyll Huettner: Treating Arretta Toenjes/Extender: Roxanna Mew, [name]: 155 Vital Signs Time Taken: 09:50 Temperature (F): 97.9 Height (in): 63 Pulse (bpm): 69 Weight (lbs): 176 Respiratory Rate (breaths/min): 18 Body Mass Index (BMI): 31.2 Blood Pressure (mmHg): 155/78 Reference Range: 80 - 120 mg / dl Electronic Signature(s) Signed: 08/12/2023 4:40:46 PM By: Thayer Dallas Entered By: Thayer Dallas on 08/12/2023 06:51:14

## 2023-08-24 ENCOUNTER — Other Ambulatory Visit: Payer: Self-pay

## 2023-08-24 ENCOUNTER — Inpatient Hospital Stay: Payer: Medicare Other | Attending: Hematology & Oncology | Admitting: Medical Oncology

## 2023-08-24 ENCOUNTER — Inpatient Hospital Stay: Payer: Medicare Other

## 2023-08-24 ENCOUNTER — Encounter: Payer: Self-pay | Admitting: Medical Oncology

## 2023-08-24 VITALS — BP 139/50 | HR 74 | Temp 97.9°F | Resp 18 | Ht 64.0 in | Wt 195.0 lb

## 2023-08-24 DIAGNOSIS — Z79811 Long term (current) use of aromatase inhibitors: Secondary | ICD-10-CM | POA: Insufficient documentation

## 2023-08-24 DIAGNOSIS — C50911 Malignant neoplasm of unspecified site of right female breast: Secondary | ICD-10-CM | POA: Diagnosis not present

## 2023-08-24 DIAGNOSIS — D631 Anemia in chronic kidney disease: Secondary | ICD-10-CM | POA: Diagnosis not present

## 2023-08-24 DIAGNOSIS — C50011 Malignant neoplasm of nipple and areola, right female breast: Secondary | ICD-10-CM

## 2023-08-24 DIAGNOSIS — D508 Other iron deficiency anemias: Secondary | ICD-10-CM | POA: Diagnosis not present

## 2023-08-24 DIAGNOSIS — N189 Chronic kidney disease, unspecified: Secondary | ICD-10-CM | POA: Insufficient documentation

## 2023-08-24 DIAGNOSIS — R102 Pelvic and perineal pain: Secondary | ICD-10-CM

## 2023-08-24 DIAGNOSIS — Z1231 Encounter for screening mammogram for malignant neoplasm of breast: Secondary | ICD-10-CM

## 2023-08-24 DIAGNOSIS — E611 Iron deficiency: Secondary | ICD-10-CM | POA: Diagnosis not present

## 2023-08-24 DIAGNOSIS — Z86718 Personal history of other venous thrombosis and embolism: Secondary | ICD-10-CM | POA: Insufficient documentation

## 2023-08-24 DIAGNOSIS — Z7982 Long term (current) use of aspirin: Secondary | ICD-10-CM | POA: Diagnosis not present

## 2023-08-24 DIAGNOSIS — D500 Iron deficiency anemia secondary to blood loss (chronic): Secondary | ICD-10-CM

## 2023-08-24 DIAGNOSIS — D509 Iron deficiency anemia, unspecified: Secondary | ICD-10-CM | POA: Insufficient documentation

## 2023-08-24 LAB — CBC WITH DIFFERENTIAL (CANCER CENTER ONLY)
Abs Immature Granulocytes: 0.03 10*3/uL (ref 0.00–0.07)
Basophils Absolute: 0 10*3/uL (ref 0.0–0.1)
Basophils Relative: 1 %
Eosinophils Absolute: 0.2 10*3/uL (ref 0.0–0.5)
Eosinophils Relative: 4 %
HCT: 34.5 % — ABNORMAL LOW (ref 36.0–46.0)
Hemoglobin: 10.9 g/dL — ABNORMAL LOW (ref 12.0–15.0)
Immature Granulocytes: 1 %
Lymphocytes Relative: 33 %
Lymphs Abs: 2.1 10*3/uL (ref 0.7–4.0)
MCH: 29.1 pg (ref 26.0–34.0)
MCHC: 31.6 g/dL (ref 30.0–36.0)
MCV: 92 fL (ref 80.0–100.0)
Monocytes Absolute: 0.5 10*3/uL (ref 0.1–1.0)
Monocytes Relative: 8 %
Neutro Abs: 3.4 10*3/uL (ref 1.7–7.7)
Neutrophils Relative %: 53 %
Platelet Count: 206 10*3/uL (ref 150–400)
RBC: 3.75 MIL/uL — ABNORMAL LOW (ref 3.87–5.11)
RDW: 14.4 % (ref 11.5–15.5)
WBC Count: 6.3 10*3/uL (ref 4.0–10.5)
nRBC: 0 % (ref 0.0–0.2)

## 2023-08-24 LAB — CMP (CANCER CENTER ONLY)
ALT: 12 U/L (ref 0–44)
AST: 14 U/L — ABNORMAL LOW (ref 15–41)
Albumin: 4.5 g/dL (ref 3.5–5.0)
Alkaline Phosphatase: 66 U/L (ref 38–126)
Anion gap: 9 (ref 5–15)
BUN: 18 mg/dL (ref 8–23)
CO2: 30 mmol/L (ref 22–32)
Calcium: 9.6 mg/dL (ref 8.9–10.3)
Chloride: 105 mmol/L (ref 98–111)
Creatinine: 0.73 mg/dL (ref 0.44–1.00)
GFR, Estimated: >60 mL/min (ref >60)
Glucose, Bld: 112 mg/dL — ABNORMAL HIGH (ref 70–99)
Potassium: 3.8 mmol/L (ref 3.5–5.1)
Sodium: 144 mmol/L (ref 135–145)
Total Bilirubin: 0.6 mg/dL (ref <1.2)
Total Protein: 7.8 g/dL (ref 6.5–8.1)

## 2023-08-24 LAB — FERRITIN: Ferritin: 563 ng/mL — ABNORMAL HIGH (ref 11–307)

## 2023-08-24 LAB — IRON AND IRON BINDING CAPACITY (CC-WL,HP ONLY)
Iron: 72 ug/dL (ref 28–170)
Saturation Ratios: 28 % (ref 10.4–31.8)
TIBC: 260 ug/dL (ref 250–450)
UIBC: 188 ug/dL (ref 148–442)

## 2023-08-24 LAB — RETICULOCYTES
Immature Retic Fract: 12.6 % (ref 2.3–15.9)
RBC.: 3.74 MIL/uL — ABNORMAL LOW (ref 3.87–5.11)
Retic Count, Absolute: 64 10*3/uL (ref 19.0–186.0)
Retic Ct Pct: 1.7 % (ref 0.4–3.1)

## 2023-08-24 MED ORDER — DARBEPOETIN ALFA 300 MCG/0.6ML IJ SOSY
300.0000 ug | PREFILLED_SYRINGE | Freq: Once | INTRAMUSCULAR | Status: AC
Start: 2023-08-24 — End: 2023-08-24
  Administered 2023-08-24: 300 ug via SUBCUTANEOUS
  Filled 2023-08-24: qty 0.6

## 2023-08-24 NOTE — Progress Notes (Signed)
Hematology and Oncology Follow Up Visit  Rien Mcgurl [id number] 1951-01-17 72 y.o. 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 daily - restarted on 09/23/2022 Aspirin 81 mg p.o. daily IV iron as needed Aranesp 300 mcg sq for Hgb less than 11    Interim History:  Ms. Musco is here today for follow-up.  She reports that she has been doing well since her last visit other than fatigue No breast concerns or changes noted by patient.  No mass, lesion or rash noted.  No adenopathy noted on exam.  She has chronic edema in her lower extremities she states due to PVD. No falls or syncope reported.  Appetite and hydration are good.  No fever, chills, n/v, cough, rash, dizziness, SOB, chest pain, palpitations,  or changes in bowel or bladder habits. When discussing abdominal pain she mentions some off and on right pelvic pain which she has had. She has all of her reproductive organs. She has not noticed any vaginal bleeding.  No blood loss noted. No bruising or petechiae.  Wt Readings from Last 3 Encounters:  08/24/23 195 lb (88.5 kg)  03/10/23 192 lb 12.8 oz (87.5 kg)  12/15/22 198 lb 12.8 oz (90.2 kg)   ECOG Performance Status: 1 - Symptomatic but completely ambulatory  Medications:  Allergies as of 08/24/2023       Reactions   Contrast Media [iodinated Contrast Media] Hives, Shortness Of Breath, Nausea And Vomiting   Allergic reaction even after pre-meds.   Metrizamide Hives, Shortness Of Breath, Nausea And Vomiting   Allergic reaction even after pre-meds.   Other Shortness Of Breath, Nausea And Vomiting, Rash   Allergic reaction even after pre-meds.   Dye Fdc Blue [brilliant Blue Fcf (fd&c Blue #1)] Hives, Nausea Only   Fd&c Blue #1 (brilliant Blue) Hives, Nausea Only         Medication List        Accurate as of August 24, 2023 10:41 AM. If you have any questions, ask your nurse or doctor.          STOP taking these medications    candesartan 32 MG tablet Commonly known as: ATACAND Stopped by: Rushie Chestnut   Gentamicin Sulfate Powd Stopped by: Rushie Chestnut   metoprolol succinate 50 MG 24 hr tablet Commonly known as: TOPROL-XL Stopped by: Rushie Chestnut       TAKE these medications    amLODipine 10 MG tablet Commonly known as: NORVASC TAKE 1 TABLET BY MOUTH DAILY   amoxicillin 500 MG capsule Commonly known as: AMOXIL Before dental procedures   aspirin EC 81 MG tablet Take 81 mg by mouth daily.   celecoxib 200 MG capsule Commonly known as: CELEBREX Take 200 mg by mouth daily as needed.   exemestane 25 MG tablet Commonly known as: AROMASIN TAKE 1 TABLET BY MOUTH EVERY DAY   hydrochlorothiazide 25 MG tablet Commonly known as: HYDRODIURIL Take 25 mg by mouth daily.   IBUPROFEN PO Take 400 mg by mouth 2 (two) times daily as needed.   metoprolol tartrate 100 MG tablet Commonly known as: LOPRESSOR Take 100 mg by mouth daily.   simvastatin 20 MG tablet Commonly known as: ZOCOR Take 20 mg by mouth daily.   valsartan 160 MG tablet Commonly known as: DIOVAN  Take 160 mg by mouth daily.   Vitamin D (Ergocalciferol) 1.25 MG (50000 UNIT) Caps capsule Commonly known as: DRISDOL TAKE 1 CAPSULE BY MOUTH ONE TIME PER WEEK        Allergies:  Allergies  Allergen Reactions   Contrast Media [Iodinated Contrast Media] Hives, Shortness Of Breath and Nausea And Vomiting    Allergic reaction even after pre-meds.   Metrizamide Hives, Shortness Of Breath and Nausea And Vomiting    Allergic reaction even after pre-meds.   Other Shortness Of Breath, Nausea And Vomiting and Rash    Allergic reaction even after pre-meds.   Dye Fdc Blue [Brilliant Blue Fcf (Fd&C Blue #1)] Hives and Nausea Only   Fd&C Blue #1 (Brilliant  Blue) Hives and Nausea Only    Past Medical History, Surgical history, Social history, and Family History were reviewed and updated.  Review of Systems: All other 10 point review of systems is negative.   Physical Exam:  height is 5\' 4"  (1.626 m) and weight is 195 lb (88.5 kg). Her oral temperature is 97.9 F (36.6 C). Her blood pressure is 139/50 (abnormal) and her pulse is 74. Her respiration is 18 and oxygen saturation is 100%.   Wt Readings from Last 3 Encounters:  08/24/23 195 lb (88.5 kg)  03/10/23 192 lb 12.8 oz (87.5 kg)  12/15/22 198 lb 12.8 oz (90.2 kg)    Ocular: Sclerae unicteric, pupils equal, round and reactive to light Ear-nose-throat: Oropharynx clear, dentition fair Lymphatic: No cervical, supraclavicular or axillary adenopathy Lungs no rales or rhonchi, good excursion bilaterally Heart regular rate and rhythm, no murmur appreciated Abd soft, nontender, positive bowel sounds MSK no focal spinal tenderness, no joint edema Neuro: non-focal, well-oriented, appropriate affect Breasts: Deferred today  Lab Results  Component Value Date   WBC 6.3 08/24/2023   HGB 10.9 (L) 08/24/2023   HCT 34.5 (L) 08/24/2023   MCV 92.0 08/24/2023   PLT 206 08/24/2023   Lab Results  Component Value Date   FERRITIN 435 (H) 06/01/2023   IRON 62 06/01/2023   TIBC 234 (L) 06/01/2023   UIBC 172 06/01/2023   IRONPCTSAT 27 06/01/2023   Lab Results  Component Value Date   RETICCTPCT 1.7 08/24/2023   RBC 3.74 (L) 08/24/2023   RETICCTABS 55.2 06/20/2011   No results found for: "KPAFRELGTCHN", "LAMBDASER", "KAPLAMBRATIO" No results found for: "IGGSERUM", "IGA", "IGMSERUM" Lab Results  Component Value Date   TOTALPROTELP 6.6 08/14/2010   ALBUMINELP 53.8 (L) 08/14/2010   A1GS 5.3 (H) 08/14/2010   A2GS 14.3 (H) 08/14/2010   BETS 5.8 08/14/2010   BETA2SER 6.3 08/14/2010   GAMS 14.5 08/14/2010   MSPIKE NOT DET 08/14/2010   SPEI * 08/14/2010     Chemistry      Component Value  Date/Time   NA 144 08/24/2023 0935   NA 146 (H) 07/23/2017 0900   NA 144 02/18/2017 0901   K 3.8 08/24/2023 0935   K 3.7 07/23/2017 0900   K 3.7 02/18/2017 0901   CL 105 08/24/2023 0935   CL 107 07/23/2017 0900   CO2 30 08/24/2023 0935   CO2 31 07/23/2017 0900   CO2 27 02/18/2017 0901   BUN 18 08/24/2023 0935   BUN 16 07/23/2017 0900   BUN 17.7 02/18/2017 0901   CREATININE 0.73 08/24/2023 0935   CREATININE 0.8 07/23/2017 0900   CREATININE 0.7 02/18/2017 0901      Component Value Date/Time   CALCIUM 9.6 08/24/2023 0935   CALCIUM 9.3 07/23/2017  0900   CALCIUM 9.3 02/18/2017 0901   ALKPHOS 66 08/24/2023 0935   ALKPHOS 67 07/23/2017 0900   ALKPHOS 78 02/18/2017 0901   AST 14 (L) 08/24/2023 0935   AST 13 02/18/2017 0901   ALT 12 08/24/2023 0935   ALT 20 07/23/2017 0900   ALT 12 02/18/2017 0901   BILITOT 0.6 08/24/2023 0935   BILITOT 0.54 02/18/2017 0901      Encounter Diagnoses  Name Primary?   Iron deficiency anemia secondary to inadequate dietary iron intake Yes   Erythropoietin deficiency anemia    Malignant neoplasm of nipple of right breast in female, unspecified estrogen receptor status (HCC)    Iron deficiency anemia due to chronic blood loss     Impression and Plan: Ms. Fitzke is a very pleasant 72 yo caucasian female with history of locally recurrent adenocarcinoma of the right breast with resection and radiation. Also seen and followed for IDA and erythropoietin deficiency.   Last mammogram was on 09/22/2022- She will be due for her next in December.  Pelvis US order placed today and needs to be scheduled  Iron studies are pending. CA 27.29 pending   ESA given, Hgb 10.9  Continue same regimen with Aromasin. She is tolerating nicely.   Lab check and injection every 6 weeks. Follow-up in 3 months.  Rushie Chestnut, PA-C 11/11/202410:41 AM

## 2023-08-24 NOTE — Patient Instructions (Signed)

## 2023-08-25 LAB — CANCER ANTIGEN 27.29: CA 27.29: 12.5 U/mL (ref 0.0–38.6)

## 2023-08-26 ENCOUNTER — Telehealth: Payer: Self-pay

## 2023-08-26 NOTE — Telephone Encounter (Signed)
-----   Message from Rushie Chestnut sent at 08/25/2023  4:09 PM EST ----- Can you let her know that her CA 27.29 level (tumor marker) is lower than last time- great news ----- Message ----- From: Leory Plowman, Lab In [place] Sent: 08/24/2023   9:59 AM EST To: Erenest Blank, NP

## 2023-08-26 NOTE — Telephone Encounter (Signed)
Called and informed patient of lab results, patient verbalized understanding and denies any questions or concerns at this time.   

## 2023-08-27 ENCOUNTER — Encounter: Payer: Self-pay | Admitting: Family

## 2023-08-28 ENCOUNTER — Ambulatory Visit (HOSPITAL_BASED_OUTPATIENT_CLINIC_OR_DEPARTMENT_OTHER)
Admission: RE | Admit: 2023-08-28 | Discharge: 2023-08-28 | Disposition: A | Discharge status: Home / Self Care | Payer: Medicare Other | Source: Ambulatory Visit | Attending: Medical Oncology | Admitting: Medical Oncology

## 2023-08-28 DIAGNOSIS — R1031 Right lower quadrant pain: Secondary | ICD-10-CM | POA: Diagnosis not present

## 2023-08-28 DIAGNOSIS — R102 Pelvic and perineal pain: Secondary | ICD-10-CM | POA: Diagnosis not present

## 2023-08-28 DIAGNOSIS — C50011 Malignant neoplasm of nipple and areola, right female breast: Secondary | ICD-10-CM | POA: Insufficient documentation

## 2023-08-28 DIAGNOSIS — C50919 Malignant neoplasm of unspecified site of unspecified female breast: Secondary | ICD-10-CM | POA: Diagnosis not present

## 2023-09-03 ENCOUNTER — Telehealth: Payer: Self-pay

## 2023-09-03 NOTE — Telephone Encounter (Signed)
Called and left message for patient regarding [place] results

## 2023-09-03 NOTE — Telephone Encounter (Signed)
-----   Message from Rushie Chestnut sent at 09/03/2023  2:56 PM EST ----- Ovaries were not seen on ultrasound. This is good news and means that they have shrunk in size through menopause. If symptoms continue I would suggest a CT of the abdomen

## 2023-09-07 DIAGNOSIS — R7303 Prediabetes: Secondary | ICD-10-CM | POA: Diagnosis not present

## 2023-09-07 DIAGNOSIS — E782 Mixed hyperlipidemia: Secondary | ICD-10-CM | POA: Diagnosis not present

## 2023-09-09 ENCOUNTER — Encounter (HOSPITAL_BASED_OUTPATIENT_CLINIC_OR_DEPARTMENT_OTHER): Payer: BLUE CROSS/BLUE SHIELD | Admitting: Physician Assistant

## 2023-09-14 DIAGNOSIS — I10 Essential (primary) hypertension: Secondary | ICD-10-CM | POA: Diagnosis not present

## 2023-09-14 DIAGNOSIS — E782 Mixed hyperlipidemia: Secondary | ICD-10-CM | POA: Diagnosis not present

## 2023-09-14 DIAGNOSIS — Z6834 Body mass index (BMI) 34.0-34.9, adult: Secondary | ICD-10-CM | POA: Diagnosis not present

## 2023-09-14 DIAGNOSIS — I700 Atherosclerosis of aorta: Secondary | ICD-10-CM | POA: Diagnosis not present

## 2023-09-14 DIAGNOSIS — R7303 Prediabetes: Secondary | ICD-10-CM | POA: Diagnosis not present

## 2023-09-14 DIAGNOSIS — Z23 Encounter for immunization: Secondary | ICD-10-CM | POA: Diagnosis not present

## 2023-09-23 ENCOUNTER — Encounter (HOSPITAL_BASED_OUTPATIENT_CLINIC_OR_DEPARTMENT_OTHER): Payer: Medicare Other | Attending: Internal Medicine | Admitting: Internal Medicine

## 2023-09-23 DIAGNOSIS — Z5111 Encounter for antineoplastic chemotherapy: Secondary | ICD-10-CM | POA: Diagnosis not present

## 2023-09-23 DIAGNOSIS — Y842 Radiological procedure and radiotherapy as the cause of abnormal reaction of the patient, or of later complication, without mention of misadventure at the time of the procedure: Secondary | ICD-10-CM | POA: Insufficient documentation

## 2023-09-23 DIAGNOSIS — I890 Lymphedema, not elsewhere classified: Secondary | ICD-10-CM | POA: Insufficient documentation

## 2023-09-23 DIAGNOSIS — Z923 Personal history of irradiation: Secondary | ICD-10-CM | POA: Insufficient documentation

## 2023-09-23 DIAGNOSIS — Z853 Personal history of malignant neoplasm of breast: Secondary | ICD-10-CM | POA: Insufficient documentation

## 2023-09-23 DIAGNOSIS — M1990 Unspecified osteoarthritis, unspecified site: Secondary | ICD-10-CM | POA: Diagnosis not present

## 2023-09-23 DIAGNOSIS — Z9221 Personal history of antineoplastic chemotherapy: Secondary | ICD-10-CM | POA: Insufficient documentation

## 2023-09-23 DIAGNOSIS — L98492 Non-pressure chronic ulcer of skin of other sites with fat layer exposed: Secondary | ICD-10-CM | POA: Insufficient documentation

## 2023-09-23 DIAGNOSIS — I10 Essential (primary) hypertension: Secondary | ICD-10-CM | POA: Insufficient documentation

## 2023-09-23 DIAGNOSIS — Z9011 Acquired absence of right breast and nipple: Secondary | ICD-10-CM | POA: Insufficient documentation

## 2023-09-23 DIAGNOSIS — Z86718 Personal history of other venous thrombosis and embolism: Secondary | ICD-10-CM | POA: Diagnosis not present

## 2023-09-23 DIAGNOSIS — L97311 Non-pressure chronic ulcer of right ankle limited to breakdown of skin: Secondary | ICD-10-CM | POA: Diagnosis not present

## 2023-09-23 DIAGNOSIS — L598 Other specified disorders of the skin and subcutaneous tissue related to radiation: Secondary | ICD-10-CM | POA: Insufficient documentation

## 2023-09-24 DIAGNOSIS — Z1231 Encounter for screening mammogram for malignant neoplasm of breast: Secondary | ICD-10-CM | POA: Diagnosis not present

## 2023-09-24 LAB — HM MAMMOGRAPHY

## 2023-09-24 NOTE — Progress Notes (Signed)
[name], [name] ([id number]) [id number] Page 1 of 8 Visit Report for 09/23/2023 Arrival Information Details Patient Name: Date of Service: [name] 09/23/2023 9:15 A M Medical Record Number: [id number] Patient Account Number: [id number] Date of Birth/Sex: Treating RN: 06/01/1951 (72 y.o. F) Primary Care Raiford Fetterman: Charlott Rakes Other Clinician: Referring Josuha Fontanez: Treating Jasia Hiltunen/Extender: [name], [name] in Treatment: 161 Visit Information History Since Last Visit Added or deleted any medications: No Patient Arrived: Ambulatory Any new allergies or adverse reactions: No Arrival Time: 09:18 Had a fall or experienced change in No Accompanied By: friend activities of daily living that may affect Transfer Assistance: None risk of falls: Patient Identification Verified: Yes Signs or symptoms of abuse/neglect since last visito No Secondary Verification Process Completed: Yes Hospitalized since last visit: No Patient Requires Transmission-Based Precautions: No Implantable device outside of the clinic excluding No Patient Has Alerts: No cellular tissue based products placed in the center since last visit: Has Dressing in Place as Prescribed: Yes Pain Present Now: No Electronic Signature(s) Signed: 09/23/2023 5:04:55 PM By: Zenaida Deed RN, BSN Previous Signature: 09/23/2023 9:24:19 AM Version By: [name] Scrape Entered By: Zenaida Deed on 09/23/2023 06:25:33 -------------------------------------------------------------------------------- Clinic Level of Care Assessment Details Patient Name: Date of Service: [name] INS, [name] 09/23/2023 9:15 A M Medical Record Number: [id number] Patient Account Number: [id number] Date of Birth/Sex: Treating RN: 27-Nov-1950 (72 y.o. [name] Primary Care Maigan Bittinger: Charlott Rakes Other Clinician: Referring Emalee Knies: Treating Keylee Shrestha/Extender: [name], [name]  in Treatment: 161 Clinic Level of Care Assessment Items TOOL 4 Quantity Score []  - 0 Use when only an EandM is performed on FOLLOW-UP visit ASSESSMENTS - Nursing Assessment / Reassessment X- 1 10 Reassessment of Co-morbidities (includes updates in patient status) X- 1 5 Reassessment of Adherence to Treatment Plan ASSESSMENTS - Wound and Skin A ssessment / Reassessment X - Simple Wound Assessment / Reassessment - one wound 1 5 []  - 0 Complex Wound Assessment / Reassessment - multiple wounds []  - 0 Dermatologic / Skin Assessment (not related to wound area) ASSESSMENTS - Focused Assessment []  - 0 Circumferential Edema Measurements - multi extremities []  - 0 Nutritional Assessment / Counseling / Intervention [name] ([id number]) [id number] Page 2 of 8 []  - 0 Lower Extremity Assessment (monofilament, tuning fork, pulses) []  - 0 Peripheral Arterial Disease Assessment (using hand held doppler) ASSESSMENTS - Ostomy and/or Continence Assessment and Care []  - 0 Incontinence Assessment and Management []  - 0 Ostomy Care Assessment and Management (repouching, etc.) PROCESS - Coordination of Care X - Simple Patient / Family Education for ongoing care 1 15 []  - 0 Complex (extensive) Patient / Family Education for ongoing care X- 1 10 Staff obtains Chiropractor, Records, T Results / Process Orders est []  - 0 Staff telephones HHA, Nursing Homes / Clarify orders / etc []  - 0 Routine Transfer to another Facility (non-emergent condition) []  - 0 Routine Hospital Admission (non-emergent condition) []  - 0 New Admissions / Manufacturing engineer / Ordering NPWT Apligraf, etc. , []  - 0 Emergency Hospital Admission (emergent condition) X- 1 10 Simple Discharge Coordination []  - 0 Complex (extensive) Discharge Coordination PROCESS - Special Needs []  - 0 Pediatric / Minor Patient Management []  - 0 Isolation Patient Management []  - 0 Hearing / Language /  Visual special needs []  - 0 Assessment of Community assistance (transportation, D/C planning, etc.) []  - 0 Additional assistance / Altered mentation []  - 0 Support Surface(s) Assessment (bed, cushion, seat, etc.) INTERVENTIONS - Wound Cleansing /  Measurement X - Simple Wound Cleansing - one wound 1 5 []  - 0 Complex Wound Cleansing - multiple wounds X- 1 5 Wound Imaging (photographs - any number of wounds) []  - 0 Wound Tracing (instead of photographs) X- 1 5 Simple Wound Measurement - one wound []  - 0 Complex Wound Measurement - multiple wounds INTERVENTIONS - Wound Dressings X - Small Wound Dressing one or multiple wounds 1 10 []  - 0 Medium Wound Dressing one or multiple wounds []  - 0 Large Wound Dressing one or multiple wounds []  - 0 Application of Medications - topical []  - 0 Application of Medications - injection INTERVENTIONS - Miscellaneous []  - 0 External ear exam []  - 0 Specimen Collection (cultures, biopsies, blood, body fluids, etc.) []  - 0 Specimen(s) / Culture(s) sent or taken to Lab for analysis []  - 0 Patient Transfer (multiple staff / Nurse, adult / Similar devices) []  - 0 Simple Staple / Suture removal (25 or less) []  - 0 Complex Staple / Suture removal (26 or more) []  - 0 Hypo / Hyperglycemic Management (close monitor of Blood Glucose) [name] ([id number]) [id number] Page 3 of 8 []  - 0 Ankle / Brachial Index (ABI) - do not check if billed separately X- 1 5 Vital Signs Has the patient been seen at the hospital within the last three years: Yes Total Score: 85 Level Of Care: New/Established - Level 3 Electronic Signature(s) Signed: 09/23/2023 5:04:55 PM By: Zenaida Deed RN, BSN Entered By: Zenaida Deed on 09/23/2023 07:00:54 -------------------------------------------------------------------------------- Encounter Discharge Information Details Patient Name: Date of Service: [name] INS, [name] 09/23/2023 9:15 A  M Medical Record Number: [id number] Patient Account Number: [id number] Date of Birth/Sex: Treating RN: 16-Aug-1951 (72 y.o. [name] Primary Care Jedrick Hutcherson: Charlott Rakes Other Clinician: Referring Jaylin Benzel: Treating Asalee Barrette/Extender: [name], [name] in Treatment: 161 Encounter Discharge Information Items Discharge Condition: Stable Ambulatory Status: Ambulatory Discharge Destination: Home Transportation: Private Auto Accompanied By: self Schedule Follow-up Appointment: Yes Clinical Summary of Care: Patient Declined Electronic Signature(s) Signed: 09/23/2023 5:04:55 PM By: Zenaida Deed RN, BSN Entered By: Zenaida Deed on 09/23/2023 07:01:42 -------------------------------------------------------------------------------- Multi Wound Chart Details Patient Name: Date of Service: [name] INS, [name] 09/23/2023 9:15 A M Medical Record Number: [id number] Patient Account Number: [id number] Date of Birth/Sex: Treating RN: 29-Jun-1951 (72 y.o. F) Primary Care Lasondra Hodgkins: Charlott Rakes Other Clinician: Referring Marybell Robards: Treating Calista Crain/Extender: [name], [name] in Treatment: 161 Vital Signs Height(in): 63 Pulse(bpm): 72 Weight(lbs): 176 Blood Pressure(mmHg): 161/78 Body Mass Index(BMI): 31.2 Temperature(F): 97.9 Respiratory Rate(breaths/min): 18 [1:Photos:] [N/A:N/A] Right Breast (mastectomy site) N/A N/A Wound Location: Radiation Burn N/A N/A Wounding Event: Soft Tissue Radionecrosis N/A N/A Primary Etiology: Anemia, Lymphedema, Deep Vein N/A N/A Comorbid History: Thrombosis, Hypertension, Peripheral Venous Disease, Osteoarthritis, Received Chemotherapy, Received Radiation, Confinement Anxiety 07/26/2020 N/A N/A Date Acquired: 161 N/A N/A Weeks of Treatment: Open N/A N/A Wound Status: No N/A N/A Wound Recurrence: 1.9x2.5x0.5 N/A N/A Measurements L x W x D (cm) 3.731 N/A N/A A (cm) : rea 1.865 N/A  N/A Volume (cm) : -143.50% N/A N/A % Reduction in Area: -1119.00% N/A N/A % Reduction in Volume: Full Thickness Without Exposed N/A N/A Classification: Support Structures Medium N/A N/A Exudate Amount: Serosanguineous N/A N/A Exudate Type: red, brown N/A N/A Exudate Color: Epibole N/A N/A Wound Margin: Medium (34-66%) N/A N/A Granulation Amount: Pink, Pale N/A N/A Granulation Quality: Medium (34-66%) N/A N/A Necrotic Amount: Fat Layer (Subcutaneous Tissue): Yes N/A N/A Exposed Structures: Fascia: No Tendon: No Muscle:  No Joint: No Bone: No Small (1-33%) N/A N/A Epithelialization: Excoriation: No N/A N/A Periwound Skin Texture: Induration: No Callus: No Crepitus: No Rash: No Scarring: No Maceration: No N/A N/A Periwound Skin Moisture: Dry/Scaly: No Atrophie Blanche: No N/A N/A Periwound Skin Color: Cyanosis: No Ecchymosis: No Erythema: No Hemosiderin Staining: No Mottled: No Pallor: No Rubor: No No Abnormality N/A N/A Temperature: Treatment Notes Electronic Signature(s) Signed: 09/23/2023 5:18:28 PM By: Baltazar Najjar MD Entered By: Baltazar Najjar on 09/23/2023 06:57:15 -------------------------------------------------------------------------------- Multi-Disciplinary Care Plan Details Patient Name: Date of Service: [name] INS, [name] 09/23/2023 9:15 A M Medical Record Number: [id number] Patient Account Number: [id number] Date of Birth/Sex: Treating RN: [birth year] (72 y.o. [name] Primary Care Monterrius Cardosa: Charlott Rakes Other Clinician: Referring Diarra Ceja: Treating Tramel Westbrook/Extender: [name] in Treatment: [phone] Multidisciplinary Care Plan reviewed with physician [address] KIMBA, RENFREW ([id number]) [id number] Page 5 of 8 Wound/Skin Impairment Nursing Diagnoses: Impaired tissue integrity Knowledge deficit related to ulceration/compromised skin  integrity Goals: Patient/caregiver will verbalize understanding of skin care regimen Date Initiated: 08/22/2020 Target Resolution Date: 10/09/2023 Goal Status: Active Ulcer/skin breakdown will have a volume reduction of 30% by week 4 Date Initiated: 08/22/2020 Date Inactivated: 10/03/2020 Target Resolution Date: 09/19/2020 Goal Status: Met Ulcer/skin breakdown will have a volume reduction of 50% by week 8 Date Initiated: 06/05/2021 Date Inactivated: 07/17/2021 Target Resolution Date: 07/03/2021 Goal Status: Unmet Unmet Reason: infection Interventions: Assess patient/caregiver ability to obtain necessary supplies Assess patient/caregiver ability to perform ulcer/skin care regimen upon admission and as needed Assess ulceration(s) every visit Treatment Activities: Skin care regimen initiated : 08/22/2020 Topical wound management initiated : 08/22/2020 Notes: 06/05/21: Wound care regimen ongoing. 06/11/22: Wound care regimen continues, applying skin sub (Epicord) Electronic Signature(s) Signed: 09/23/2023 5:04:55 PM By: Zenaida Deed RN, BSN Entered By: Zenaida Deed on 09/23/2023 06:23:00 -------------------------------------------------------------------------------- Pain Assessment Details Patient Name: Date of Service: [name] INS, [name] 09/23/2023 9:15 A M Medical Record Number: [id number] Patient Account Number: [id number] Date of Birth/Sex: Treating RN: 01-29-1951 (72 y.o. F) Primary Care Nathyn Luiz: Charlott Rakes Other Clinician: Referring Tanganika Barradas: Treating [name]/Extender: [name], [name] in Treatment: 161 Active Problems Location of Pain Severity and Description of Pain Patient Has Paino No Site Locations Rate the pain. Current Pain Level: 0 Pain Management and Medication Current Pain Management: [name], [name] ([id number]) [phone].pdf Page 6 of 8 Electronic Signature(s) Signed: 09/23/2023 5:04:55 PM By: Zenaida Deed RN, BSN Previous Signature: 09/23/2023 9:24:19 AM Version By: [name] Scrape Entered By: Zenaida Deed on 09/23/2023 06:25:57 -------------------------------------------------------------------------------- Patient/Caregiver Education Details Patient Name: Date of Service: [name], [name] 12/11/2024andnbsp9:15 A M Medical Record Number: [id number] Patient Account Number: [id number] Date of Birth/Gender: Treating RN: Apr 12, 1951 (72 y.o. [name] Primary Care Physician: Charlott Rakes Other Clinician: Referring Physician: Treating Physician/Extender: [name] in Treatment: 161 Education Assessment Education Provided To: Patient Education Topics Provided Wound/Skin Impairment: Methods: Explain/Verbal Responses: Reinforcements needed, State content correctly [name]) Signed: 09/23/2023 5:04:55 PM By: Zenaida Deed RN, BSN Entered By: Zenaida Deed on 09/23/2023 06:23:18 -------------------------------------------------------------------------------- Wound Assessment Details Patient Name: Date of Service: [name] INS, [name] 09/23/2023 9:15 A M Medical Record Number: [id number] Patient Account Number: [id number] Date of Birth/Sex: Treating RN: 05/03/1951 (72 y.o. F) Primary Care Samrat Hayward: Charlott Rakes Other Clinician: Referring Trestan Vahle: Treating [name]/Extender: [name], [name] in Treatment: 161 Wound Status Wound Number: 1 Primary Soft Tissue Radionecrosis Etiology: Wound Location: Right Breast (mastectomy site) Wound Open Wounding Event: Radiation Burn Status:  Date Acquired: 07/26/2020 Comorbid Anemia, Lymphedema, Deep Vein Thrombosis, Hypertension, Weeks Of Treatment: 161 History: Peripheral Venous Disease, Osteoarthritis, Received Clustered Wound: No Chemotherapy, Received Radiation, Confinement Anxiety Photos [name], [name] ([id number]) [id number] Page 7 of  8 Wound Measurements Length: (cm) 1.9 Width: (cm) 2.5 Depth: (cm) 0.5 Area: (cm) 3.731 Volume: (cm) 1.865 % Reduction in Area: -143.5% % Reduction in Volume: -1119% Epithelialization: Small (1-33%) Tunneling: No Undermining: No Wound Description Classification: Full Thickness Without Exposed Support Structures Wound Margin: Epibole Exudate Amount: Medium Exudate Type: Serosanguineous Exudate Color: red, brown Foul Odor After Cleansing: No Slough/Fibrino Yes Wound Bed Granulation Amount: Medium (34-66%) Exposed Structure Granulation Quality: Pink, Pale Fascia Exposed: No Necrotic Amount: Medium (34-66%) Fat Layer (Subcutaneous Tissue) Exposed: Yes Necrotic Quality: Adherent Slough Tendon Exposed: No Muscle Exposed: No Joint Exposed: No Bone Exposed: No Periwound Skin Texture Texture Color No Abnormalities Noted: Yes No Abnormalities Noted: Yes Moisture Temperature / Pain No Abnormalities Noted: Yes Temperature: No Abnormality Treatment Notes Wound #1 (Breast (mastectomy site)) Wound Laterality: Right Cleanser Wound Cleanser Discharge Instruction: Cleanse the wound with wound cleanser prior to applying a clean dressing using gauze sponges, not tissue or cotton balls. [employer]ram Ancillary Kit - 15 Day Supply Discharge Instruction: Use supplies as instructed; Kit contains: (15) Saline Bullets; (15) 3x3 Gauze; 15 pr Gloves Soap and Water Discharge Instruction: May shower and wash wound with dial antibacterial soap and water prior to dressing change. Peri-Wound Care Skin Prep Discharge Instruction: Use skin prep as directed Topical Primary Dressing Hydrofera Blue Ready Transfer Foam, 2.5x2.5 (in/in) Discharge Instruction: Apply directly to wound bed , cut to fit inside wound edges Secondary Dressing Woven Gauze Sponge, Non-Sterile 4x4 in Discharge Instruction: Apply over primary dressing as directed. Woven Gauze Sponges 2x2 in Discharge Instruction: Apply over primary  dressing, fold in corners to fill the space Zetuvit Plus Silicone Border Dressing 5x5 (in/in) Discharge Instruction: Apply silicone border over primary dressing as directed. [name], [name] ([id number]) [id number] Page 8 of 8 Secured With Compression Wrap Compression Stockings Facilities manager) Signed: 09/23/2023 5:04:55 PM By: Zenaida Deed RN, BSN Previous Signature: 09/23/2023 9:24:19 AM Version By: [name] Scrape Entered By: Zenaida Deed on 09/23/2023 06:57:42 -------------------------------------------------------------------------------- Vitals Details Patient Name: Date of Service: [name] INS, [name] 09/23/2023 9:15 A M Medical Record Number: [id number] Patient Account Number: [id number] Date of Birth/Sex: Treating RN: 11-18-1950 (72 y.o. F) Primary Care Evan Mackie: Charlott Rakes Other Clinician: Referring Raiza Kiesel: Treating Adeena Bernabe/Extender: [name], [name] in Treatment: 161 Vital Signs Time Taken: 09:18 Temperature (F): 97.9 Height (in): 63 Pulse (bpm): 72 Weight (lbs): 176 Respiratory Rate (breaths/min): 18 Body Mass Index (BMI): 31.2 Blood Pressure (mmHg): 161/78 Reference Range: 80 - 120 mg / dl Electronic Signature(s) Signed: 09/23/2023 5:04:55 PM By: Zenaida Deed RN, BSN Previous Signature: 09/23/2023 9:24:19 AM Version By: [name] Scrape Entered By: Zenaida Deed on 09/23/2023 06:25:44

## 2023-09-24 NOTE — Progress Notes (Addendum)
MIDGIE, ACAMPORA ([id number]) [id number] Page 1 of 10 Visit Report for 09/23/2023 HPI Details Patient Name: Date of Service: [name] YE 09/23/2023 9:15 A M Medical Record Number: [id number] Patient Account Number: [id number] Date of Birth/Sex: Treating RN: [birth year] (72 y.o. F) Primary Care Provider: Charlott Rakes Other Clinician: Referring Provider: Treating Provider/Extender: Despina Arias, Christiana Fuchs in Treatment: 161 History of Present Illness HPI Description: 08/22/2020 upon evaluation today patient actually appears to be doing somewhat poorly in regard to her mastectomy site on the right chest wall. She had a mastectomy initially in 1998. She had chemotherapy only no radiation following. In 2002 she subsequently did undergo radiation for the first time and then subsequently in 2012 had repeat radiation after having had a finding that was consistent with a relapse of the cancer. 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 She also has radiation damage to the skin over the right chest wall where she had the mastectomy. She was referred to [place] by Dr. Marcelle Overlie in [facility] and her oncologist is Dr. Arlan Organ. Subsequently I want to make sure before we delve into this more deeply that the patient does not have any issues with a return of cancer that needs to be managed by them. 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 Nonetheless she does not remember exactly when her last PET scan was but it was not too recently she tells me. 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 The patient does have hypertension as well. She sees her oncologist next on December 6. 09/12/2020 on evaluation today patient actually appears to be doing excellent  in regard to her wound under the right breast location. Currently this is measuring smaller in general seems to be doing very well. She tells me that the collagen is doing a good job and that the dressing that she has been putting on is not causing any troubles as far pulling on her skin or scar tissue is concerned all of which is excellent news. 10/03/2020 upon evaluation today patient appears to be doing well with regard to her surgical site from her mastectomy on the right breast region. She tells me that she has had very little bleeding nothing seems to be sticking badly and in general she has been extremely pleased with where things stand today. No fevers, chills, nausea, vomiting, or diarrhea. 10/24/2020 upon evaluation today patient actually appears to be doing excellent in regard to her wound. There is just a very small area still open and to be honest there was minimal slough noted on the surface of the wound nothing that requires sharp debridement. In general I feel like she is actually doing quite well and overall I am pleased. I do think we may switch to silver alginate dressing and also contemplating using a AandE ointment in order to help keep everything moist and the scar tissue region while the alginate keeps it dry enough to heal 11/14/2020 upon evaluation today patient appears to be doing well at this point in regard to her wound. I do feel like that she is making good progress again this is a very difficult region over the surgical site of the right chest wall at the site of mastectomy. Nonetheless I think that we are just a very small area  still open I think alginate is doing a good job helping to dry this up and I would recommend this such that we continue with this. 01/02/2021 on evaluation today patient's wound actually appears to be doing decently well. There does not appear to be any signs of active infection which is great news. She does have some slight slough buildup with  biofilm on the surface of the wound mainly more biofilm. Nonetheless I was able to gently remove this with saline and gauze as well as a sterile Q-tip. She tolerated that today without complication. 01/30/2021 upon evaluation today patient appears to be doing well with regard to her wound although she still has quite a bit of trouble getting this to close I think the scar tissue and radiation damage is quite significant here unfortunately. There does not appear to be any signs of active infection which is great news. No fevers, chills, nausea, vomiting, or diarrhea. 02/27/2021 upon evaluation today patient appears to be doing excellent in regard to her wound. She has been tolerating the dressing changes without complication and in general I am extremely pleased with where things stand today. There does not appear to be any signs of infection which is great news. No fevers, chills, nausea, vomiting, or diarrhea. With that being said I do think that she still developing some slough buildup. I did discuss with her today the possibility of proceeding with HBO therapy. We have had this discussion before but she really is not quite committed to wanting to do that much and spend that much time in the chamber. She wants to still think about it. 03/27/2021 upon evaluation today patient appears to be doing about the same in regard to her wound. She still developing a lot of slough buildup on the surface of the wound. I did try to clean that away to some degree today. She tolerated that without any pain or complication. Subsequently I am thinking we may want to switch over to [facility] see if this may do better for her. Patient is in agreement with giving that a trial. 05/01/2021 upon evaluation today patient appears to be doing about the same in regard to her wounds. She has been tolerating the dressing changes without complication. Fortunately there does not appear to be any signs of active infection at this time  which is great news. No fevers, chills, nausea, vomiting, or diarrhea.. 06/05/2021 upon evaluation today patient appears to be doing pretty well currently in regard to her wound at the mastectomy site right chest wall. Overall since we switch back to the alginate she tells me things have significantly improved she is much happier with where things stand currently. Fortunately there does not appear to be any signs of active infection which is great news. No fevers, chills, nausea, vomiting, or diarrhea. 07/10/2021 upon evaluation today patient unfortunately does not appear to be doing nearly as well as what she previously was. There does not appear to be any evidence of new epithelial growth she has a lot of necrotic tissue the base of the wound this is much more significant than what we previously noted. She also has been having increased pain and increased drainage. In general I am very concerned about this especially in light of the fact that she does have a history of breast cancer I want to ensure that were not looking at any cancerous type lesion at this point. Otherwise also think she does need some debridement we did do MolecuLight scanning today. 07/17/2021 upon  evaluation today patient appears to be doing well with regard to her wound compared to last week although she still having some erythema I am concerned in this regard. I do think that she fortunately had a negative biopsy which is great news unfortunately she did have Pseudomonas noted as a bacteria present in the culture which I think is good and need to be addressed with Levaquin. I Minna send that into the pharmacy today. We did repeat the MolecuLight screening. 07/31/2021 upon evaluation today patient's wound is actually showing signs of improvement which is great news. Fortunately there does not appear to be any SHEWANNA, VEREB ([id number]) [id number] Page 2 of 10 signs of infection currently locally nor  systemically and I am very pleased in that regard. With that being said the patient is having some issues here with continued necrotic tissue I think packing with the Dakin's moistened gauze dressing is still in the right leg ago. 08/14/2021 upon evaluation today patient appears to be doing well with regard to her wound all things considered I do not see any signs of significant infection which is great news. Fortunately there does not appear to be any evidence of infection which is also great news. In general I think that the patient is tolerating the dressing changes well. We been using a Dakin's moistened gauze. 08/28/2021 upon evaluation today patient appears to be doing well with regard to her wound. Worsening signs of definite improvement which is great news and overall very pleased with where we stand today. There is no signs of inflamed tissue or infection which is great as well and I think the Dakin's is doing a great job here. 09/11/2021 upon evaluation today patient appears to be doing well with regard to her wound all things considered. I am can perform some debridement today to clear away some of the necrotic debris with that being said overall I think that she is making decently good progress here which is great news. There does not appear to be any signs of active infection also good news. That in fact is probably the best news in her mind. 09/25/2021 upon evaluation today patient appears to be doing decently well in regard to her wound. Overall I do not see any signs of infection and I think she is doing quite well. I do believe that we are making headway here although is very slow due to the scarring and the depth of the wound this is a significantly deep wound. 10/16/2021 upon evaluation today patient appears to be doing about the same in regard to the wound on her right chest wall. Fortunately there does not appear to be any signs of active infection locally nor systemically which is  great news. With that being said this still was not cleaning up quite as quickly as I would like to see. Fortunately I think that we can definitely give the Santyl another try we tried this in the past and unfortunately did not have a good result but again I think she was infected at that time as well which was part of the issue. Nonetheless I think currently that we will be able to go ahead and see what we can do about getting this little cleaner with the Santyl. 11/06/2021 upon evaluation today patient appears to be doing well with regard to her wound. This actually appears to be a little bit better with the Santyl I am happy in that regard. Fortunately I do not see any signs of active infection at  this time. No fevers, chills, nausea, vomiting, or diarrhea. 12/04/2021 upon evaluation patient appears to be doing well with the Santyl at this point. I am very pleased with where we stand and I think that she is making good progress here. Fortunately I do not see any evidence of active infection locally or systemically at this time which is great news. No fevers, chills, nausea, vomiting, or diarrhea. 01/01/2022 upon evaluation today patient actually is making excellent progress with the Santyl. I am extremely pleased with where we stand today. I do not see any signs of infection. 01-29-2022 upon evaluation today patient appears to be doing well with regard to her wound. This is showing signs of some improvement. It is a little less deep than what it was. Size wise is also slightly smaller but again there still is a significant wound in this area. Fortunately I do not see any evidence of infection and she is doing quite well. 02-26-2022 upon evaluation today patient appears to be doing well with regard to her wound. In fact this is actually the best that I have seen in a very long time. I do not see any evidence of active infection at this time locally or systemically which is great news and overall I think  we are on the right track. Nonetheless I do believe that the patient is continuing to show evidence of improvement with the Santyl week by week. 03-26-2022 upon evaluation today patient appears to be doing well with regard to the wound on her right mastectomy site. She is actually showing signs of excellent improvement in fact there was some bleeding just with a little bit of light cleaning over the area. Fortunately I do not see any signs of active infection locally or systemically at this time which is great news. 04-30-2022 upon evaluation today patient appears to be doing well currently in regard to her wound. She is actually showing some signs of improvement here which is great news. Still this is very very slow. Subsequently I do believe that she may benefit from the use of Keystone topical antibiotics for short amount of time before applying a skin substitute I think EpiCord could be doing very well for her as far as that is concerned. I discussed that with her today. Fortunately I do not see any evidence of active infection locally or systemically at this time which is great news. No fevers, chills, nausea, vomiting, or diarrhea. 05-28-2022 upon evaluation today patient's wound is actually showing signs of significant improvement. Fortunately I do not see any evidence of infection at this time which is great news. No fevers, chills, nausea, vomiting, or diarrhea. With that being said I do believe that the patient is tolerating the dressing changes without complication. We been using the [facility] which I think is helpful. We do have approval for the EpiCord. 06-11-2022 upon evaluation today patient appears to be doing well with regard to her wound she is actually here today for the first application of EpiCord which I think is good to be beneficial for her. I do think that we will probably be able to keep this in place without can be the one thing of question to be honest. 06-18-2022 upon evaluation  today patient actually appears to be doing excellent today. She has 1 treatment of the EpiCord underway and the wound surface already looks much better. This will be EpiCord #2 today. Fortunately I think we are on the right track here. 06-25-2022 upon evaluation today patient appears  to be making good progress and overall the patient seems to have tolerated EpiCord without any complication. I do feel like that the wound is improving quite significantly. This is EpiCord #3 today. 07-02-2021 upon evaluation today patient's wound is actually showing signs of improvement. I am actually very pleased with where we stand we have been seeing some improvements in size overall and I think that we are still in the right track although there is a little bit of need for sharp debridement today to clearway some of the necrotic debris and remaining EpiCord which is actually still somewhat adhered to the wound bed. Overall I am extremely pleased though with where we stand. This is EpiCord #4 today. Upon evaluation today patient appears to be doing well with regard to her wound the EpiCord is definitely helping and does seem to be improving the overall status of the wound the surface is dramatically improved compared to what it was at the start of this. The tissue is actually becoming more red than it is just a pale pink and to be honest some of the did not even appear to be pink in the start. Nonetheless I am very pleased with where we stand currently. No fevers, chills, nausea, vomiting, or diarrhea. 07-16-2022 upon evaluation today patient appears to be doing well currently in regard to her wound she is doing well with the EpiCord this is application #6 today. 07-23-2022 upon evaluation today patient's wound actually showing signs of excellent improvement which is great news. Fortunately I think were making progress again this is a very scarred area which now has a pretty good vascular supply which is great news.  Overall I think that she is making great progress. This is EpiCord #7 today 10/18; Epicort No. 8. Very minimally smaller. Wounds on the right breast mastectomy site complicated by soft tissue radiation damage. Unfortunately she lives in [place], 5 days a week transport would be impossible for her to arrange 08-06-2022 upon evaluation today patient's wound is showing signs again of minimal improvement with regard to the wound bed again this is something that would probably respond well to hyperbaric oxygen therapy and I think should we can probably get this approved but she is just not able to make the transportation from [place] here daily for the 44-month period of time. Nonetheless I do think that she seems to be doing much better with regard to the surface of the wound and I am pleased that regard I am going to go ahead and continue with the EpiCord this is #9 application today. 08-13-2022 upon evaluation today patient's wound again is showing signs of improved quality to the tissue in the base of the wound. Fortunately I do not see any evidence of infection which is great news and overall I am extremely pleased in that regard. In general I do believe that the patient is making progress [name], [name] ([id number]) [id number] Page 3 of 10 here. No fevers, chills, nausea, vomiting, or diarrhea. She is here today for EpiCord application #10 11/8; patient with a wound on the lower part of the right breast area. She is apparently had a nice response to epi cord but she has completed this. It is difficult not to look over this lady's history and look at the wound and wonder about the known benefits of hyperbaric oxygen for soft tissue radionecrosis however she is unable to make transportation arrangements. She lives in [place] 08-27-2022 upon evaluation today patient appears to be doing well in regard  to her wound. She has been tolerating the dressing changes without  complication wheezing using the silver collagen over the past week and she tolerated that without complication. Fortunately I see no evidence of active infection locally or systemically at this time. 09-17-2022 upon evaluation today patient appears to be doing well currently in regard to her wound. She has been tolerating the dressing changes without complication. Fortunately there does not appear to be any signs of active infection locally or systemically at this time which is great news. No fevers, chills, nausea, vomiting, or diarrhea. 10-15-2022 upon evaluation today patient appears to be doing well currently in regard to her wound. This is showing signs of good improvement which is great news and overall I am extremely pleased with where we stand today. Fortunately there does not appear to be any signs of active infection at this time which is great news as well. No fevers, chills, nausea, vomiting, or diarrhea. 11-12-2022 upon evaluation today patient appears to be doing pretty well in regard to her wound. She has been tolerating the dressing changes without complication and fortunately there does not appear to be any signs of active infection locally nor systemically which is great news. No fevers, chills, nausea, vomiting, or diarrhea. 12-10-2022 upon evaluation today patient appears to be doing well currently in regard to her wound. She has been tolerating the dressing changes without complication. Fortunately there does not appear to be any signs of active infection locally nor systemically which is great news. No fevers, chills, nausea, vomiting, or diarrhea. 01-07-2023 upon evaluation today patient appears to be doing actually better in regard to her wound I am actually very pleased with where things stand. The size is about the same but the base of the wound actually appears to be healthier with more red granulation tissue and there is some slough and biofilm I am going to perform debridement  to clear some of this wound today for continuing with collagen at this point she is doing a good job taking care of this. 02-04-2023 upon evaluation today patient appears to be doing well currently in regard to her wound. She has been tolerating the dressing changes without complication in general I really feel like they were making excellent progress here. Fortunately I do not see any signs of active infection locally or systemically which is great news. She is going require some debridement but overall I think that though slow this still continues to make some progress little by little. 03-11-2023 upon evaluation today patient's wound is doing roughly about the same. I do not see any signs of overall worsening which is great news and I am very pleased in that regard. With that being said I do believe that she does have a issue here with ongoing radiation damage to this region which is making it very hard to get the close but again we will continue to work as best we can on this. 04-08-2023 upon evaluation today patient appears to be doing well currently in regard to her wound. She has been tolerating the dressing changes without complication. Fortunately I do not see any signs of active infection locally or systemically at this time. 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 She apparently ran a course of epi cord without any improvement now has been on silver collagen for a while without obvious improvement either at least in terms of wound dimensions. She has  no other complaints today. She changes her dressing every second day 06-03-2023 upon evaluation today patient appears to be doing well currently in regard to her wound. She has been tolerating the dressing changes without complication. Fortunately I do not see any signs of active infection locally nor systemically at this time. 07-01-2023 upon evaluation  today patient appears to be doing pretty well in regard to her wound in the right breast location where she has had a right breast removal. Subsequently she tells me that she is continuing to do well with the dressings at this location which is good news. With that being said unfortunately she does have issues of swelling in the right lower extremity. She also has a wound on the medial ankle very superficial but nonetheless that is a concern at this point. I discussed with the patient that I do believe that we can manage this for her at this point to get this healed. She voiced understanding. With that being said I am going to see about getting her initiated with a compression wrap. I think that is probably good to be the best option here for her. 07-08-2023 upon evaluation today patient appears to be doing well currently in regard to her wounds. She has been tolerating the dressing changes without complication. Fortunately I do not see any evidence of worsening overall I do believe that the patient is making good headway towards complete closure. 07/15/2023 upon evaluation today patient appears to be doing excellent in regard to her right leg ulcer this is completely healed. I am actually very pleased with where things stand currently. 08-12-2023 upon evaluation today patient appears to be doing well currently in regard to her wound which is actually showing some signs of improvement. Fortunately I do not see any evidence of worsening overall believe that the patient is making good headway towards closure which is good news. 12/11; this is a patient we follow chronically for a wound on her right anterior chest after being treated with radiation for breast cancer. She had 2 separate courses of radiation she tells me. She has a quarter sized punched-out area. The depth of this goes right against the chest wall. She has been using [facility], prior to this had a prolonged course of Risk manager) Signed: 09/23/2023 5:18:28 PM By: Baltazar Najjar MD Entered By: Baltazar Najjar on 09/23/2023 09:58:14 -------------------------------------------------------------------------------- Physical Exam Details Patient Name: Date of Service: [name], [name] 09/23/2023 9:15 A M Medical Record Number: [id number] Patient Account Number: [id number] Date of Birth/Sex: Treating RN: May 27, 1951 (72 y.o. F) Primary Care Provider: Charlott Rakes Other Clinician: [name] ([id number]) [id number] Page 4 of 10 Referring Provider: Treating Provider/Extender: Despina Arias, Christiana Fuchs in Treatment: 161 Constitutional Patient is hypertensive.. Pulse regular and within target range for patient.Marland Kitchen Respirations regular, non-labored and within target range.. Temperature is normal and within the target range for the patient.Marland Kitchen Appears in no distress. Notes Wound exam; the areas on the chest wall on the right. Surface is right against the chest wall. There is a small rim of epithelium that is gone down into the divot but most of this is still open. Under illumination probably a fibrinous surface. There is no evidence of surrounding infection no exposed bone Electronic Signature(s) Signed: 09/23/2023 5:18:28 PM By: Baltazar Najjar MD Entered By: Baltazar Najjar on 09/23/2023 09:59:13 -------------------------------------------------------------------------------- Physician Orders Details Patient Name: Date of Service: [name], [name] 09/23/2023 9:15 A M Medical Record Number: [id number] Patient Account Number: [id number]  Date of Birth/Sex: Treating RN: 03/07/1951 (72 y.o. Tommye Standard Primary Care Provider: Charlott Rakes Other Clinician: Referring Provider: Treating Provider/Extender: Despina Arias, Christiana Fuchs in Treatment: 161 The following information was scribed by: Zenaida Deed The information was scribed for: Baltazar Najjar Verbal / Phone Orders: No Diagnosis Coding ICD-10 Coding Code Description [phone] Non-pressure chronic ulcer of skin of other sites with fat layer exposed L58.9 Radiodermatitis, unspecified L59.8 Other specified disorders of the skin and subcutaneous tissue related to radiation I87.331 Chronic venous hypertension (idiopathic) with ulcer and inflammation of right lower extremity 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 Allen Derry, PA Room 8 Other: - wear compression stockings daily Anesthetic (In clinic) Topical Lidocaine 4% applied to wound bed Cellular or Tissue Based Products daptic or Mepitel. (DO NOT REMOVE). - Cellular or Tissue Based Product applied to wound bed, secured with steri-strips, cover with A Already Done- Epicord #1 06/11/22, #2 06/18/22, #3 06/25/22, #4 07/02/22, #5 07/09/22, #6 07/16/22, Epicord #7 07/23/22, Epicord #8 07/30/22, Epicord #9 08/06/2022. Epicord # 10 08/13/22 - no more after 10th application, per PA we will see how she does after this with collagen for now. Bathing/ Shower/ Hygiene May shower and wash wound with soap and water. Additional Orders / Instructions Follow Nutritious Diet Wound Treatment Wound #1 - Breast (mastectomy site) Wound Laterality: Right Cleanser: Wound Cleanser (Generic) Every Other Day/30 Days Discharge Instructions: Cleanse the wound with wound cleanser prior to applying a clean dressing using gauze sponges, not tissue or cotton balls. Cleanser: [place]ram Ancillary Kit - 15 Day Supply (DME) (Generic) Every Other Day/30 Days Discharge Instructions: Use supplies as instructed; Kit contains: (15) Saline Bullets; (15) 3x3 Gauze; 15 pr Gloves Cleanser: Soap and Water Every Other Day/30 Days [name], [name] ([id number]) [id number] Page 5 of 10 Discharge Instructions: May shower and  wash wound with dial antibacterial soap and water prior to dressing change. Peri-Wound Care: Skin Prep (DME) (Generic) Every Other Day/30 Days Discharge Instructions: Use skin prep as directed Prim Dressing: Hydrofera Blue Ready Transfer Foam, 2.5x2.5 (in/in) (DME) (Dispense As Written) Every Other Day/30 Days ary Discharge Instructions: Apply directly to wound bed , cut to fit inside wound edges Secondary Dressing: Woven Gauze Sponge, Non-Sterile 4x4 in (DME) (Generic) Every Other Day/30 Days Discharge Instructions: Apply over primary dressing as directed. Secondary Dressing: Woven Gauze Sponges 2x2 in (Generic) Every Other Day/30 Days Discharge Instructions: Apply over primary dressing, fold in corners to fill the space Secondary Dressing: Zetuvit Plus Silicone Border Dressing 5x5 (in/in) (DME) (Generic) Every Other Day/30 Days Discharge Instructions: Apply silicone border over primary dressing as directed. Electronic Signature(s) Signed: 09/23/2023 5:04:55 PM By: Zenaida Deed RN, BSN Signed: 09/23/2023 5:18:28 PM By: Baltazar Najjar MD Entered By: Zenaida Deed on 09/23/2023 10:00:12 -------------------------------------------------------------------------------- Problem List Details Patient Name: Date of Service: [name], [name] 09/23/2023 9:15 A M Medical Record Number: [id number] Patient Account Number: [id number] Date of Birth/Sex: Treating RN: 02-20-1951 (72 y.o. Tommye Standard Primary Care Provider: Charlott Rakes Other Clinician: Referring Provider: Treating Provider/Extender: Despina Arias, Christiana Fuchs in Treatment: [phone] Active Problems ICD-10 Encounter Code Description Active Date MDM Diagnosis L98.492 Non-pressure chronic ulcer of skin of other sites with fat layer exposed 08/22/2020 No Yes L58.9 Radiodermatitis, unspecified 08/22/2020 No Yes L59.8 Other specified disorders of the skin and subcutaneous tissue related to 08/22/2020 No  Yes radiation I87.331 Chronic venous hypertension (idiopathic)  with ulcer and inflammation of right 07/01/2023 No Yes lower extremity 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 07/01/2023 No Yes I10 Essential (primary) hypertension 08/22/2020 No Yes Inactive Problems DIONTE, PREECE ([id number]) [id number] Page 6 of 10 Resolved Problems Electronic Signature(s) Signed: 09/23/2023 5:18:28 PM By: Baltazar Najjar MD Entered By: Baltazar Najjar on 09/23/2023 09:57:09 -------------------------------------------------------------------------------- Progress Note Details Patient Name: Date of Service: [name], [name] 09/23/2023 9:15 A M Medical Record Number: [id number] Patient Account Number: [id number] Date of Birth/Sex: Treating RN: 01-Oct-1951 (72 y.o. F) Primary Care Provider: Charlott Rakes Other Clinician: Referring Provider: Treating Provider/Extender: Despina Arias, Christiana Fuchs in Treatment: 161 Subjective History of Present Illness (HPI) 08/22/2020 upon evaluation today patient actually appears to be doing somewhat poorly in regard to her mastectomy site on the right chest wall. She had a mastectomy initially in 1998. She had chemotherapy only no radiation following. In 2002 she subsequently did undergo radiation for the first time and then subsequently in 2012 had repeat radiation after having had a finding that was consistent with a relapse of the cancer. 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 She also has radiation damage to the skin over the right chest wall where she had the mastectomy. She was referred to [place] by Dr. Marcelle Overlie in [facility] and her oncologist is Dr. Arlan Organ. Subsequently I want to make sure before we delve into this more deeply that the patient does not have any issues with a return of cancer that needs to be managed by them.  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 Nonetheless she does not remember exactly when her last PET scan was but it was not too recently she tells me. 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 The patient does have hypertension as well. She sees her oncologist next on December 6. 09/12/2020 on evaluation today patient actually appears to be doing excellent in regard to her wound under the right breast location. Currently this is measuring smaller in general seems to be doing very well. She tells me that the collagen is doing a good job and that the dressing that she has been putting on is not causing any troubles as far pulling on her skin or scar tissue is concerned all of which is excellent news. 10/03/2020 upon evaluation today patient appears to be doing well with regard to her surgical site from her mastectomy on the right breast region. She tells me that she has had very little bleeding nothing seems to be sticking badly and in general she has been extremely pleased with where things stand today. No fevers, chills, nausea, vomiting, or diarrhea. 10/24/2020 upon evaluation today patient actually appears to be doing excellent in regard to her wound. There is just a very small area still open and to be honest there was minimal slough noted on the surface of the wound nothing that requires sharp debridement. In general I feel like she is actually doing quite well and overall I am pleased. I do think we may switch to silver alginate dressing and also contemplating using a AandE ointment in order to help keep everything moist and the scar tissue region while the alginate keeps it dry enough to heal 11/14/2020 upon evaluation today patient appears to be  doing well at this point in regard to her wound. I do feel like that she is making good  progress again this is a very difficult region over the surgical site of the right chest wall at the site of mastectomy. Nonetheless I think that we are just a very small area still open I think alginate is doing a good job helping to dry this up and I would recommend this such that we continue with this. 01/02/2021 on evaluation today patient's wound actually appears to be doing decently well. There does not appear to be any signs of active infection which is great news. She does have some slight slough buildup with biofilm on the surface of the wound mainly more biofilm. Nonetheless I was able to gently remove this with saline and gauze as well as a sterile Q-tip. She tolerated that today without complication. 01/30/2021 upon evaluation today patient appears to be doing well with regard to her wound although she still has quite a bit of trouble getting this to close I think the scar tissue and radiation damage is quite significant here unfortunately. There does not appear to be any signs of active infection which is great news. No fevers, chills, nausea, vomiting, or diarrhea. 02/27/2021 upon evaluation today patient appears to be doing excellent in regard to her wound. She has been tolerating the dressing changes without complication and in general I am extremely pleased with where things stand today. There does not appear to be any signs of infection which is great news. No fevers, chills, nausea, vomiting, or diarrhea. With that being said I do think that she still developing some slough buildup. I did discuss with her today the possibility of proceeding with HBO therapy. We have had this discussion before but she really is not quite committed to wanting to do that much and spend that much time in the chamber. She wants to still think about it. 03/27/2021 upon evaluation today patient appears to be doing about the same in regard to her wound. She still developing a lot of slough buildup on the  surface of the wound. I did try to clean that away to some degree today. She tolerated that without any pain or complication. Subsequently I am thinking we may want to switch over to [facility] see if this may do better for her. Patient is in agreement with giving that a trial. 05/01/2021 upon evaluation today patient appears to be doing about the same in regard to her wounds. She has been tolerating the dressing changes without complication. Fortunately there does not appear to be any signs of active infection at this time which is great news. No fevers, chills, nausea, vomiting, or diarrhea.. 06/05/2021 upon evaluation today patient appears to be doing pretty well currently in regard to her wound at the mastectomy site right chest wall. Overall since we switch back to the alginate she tells me things have significantly improved she is much happier with where things stand currently. Fortunately there does not appear to be any signs of active infection which is great news. No fevers, chills, nausea, vomiting, or diarrhea. 07/10/2021 upon evaluation today patient unfortunately does not appear to be doing nearly as well as what she previously was. There does not appear to be any evidence of new epithelial growth she has a lot of necrotic tissue the base of the wound this is much more significant than what we previously noted. She also has been having increased pain and increased drainage. In general I  am very concerned about this especially in light of the fact that she does have a history of breast cancer I want to ensure that were not looking at any cancerous type lesion at this point. Otherwise also think she does need some debridement we did do MolecuLight scanning today. [name], [name] ([id number]) [id number] Page 7 of 10 07/17/2021 upon evaluation today patient appears to be doing well with regard to her wound compared to last week although she still having some erythema I  am concerned in this regard. I do think that she fortunately had a negative biopsy which is great news unfortunately she did have Pseudomonas noted as a bacteria present in the culture which I think is good and need to be addressed with Levaquin. I Minna send that into the pharmacy today. We did repeat the MolecuLight screening. 07/31/2021 upon evaluation today patient's wound is actually showing signs of improvement which is great news. Fortunately there does not appear to be any signs of infection currently locally nor systemically and I am very pleased in that regard. With that being said the patient is having some issues here with continued necrotic tissue I think packing with the Dakin's moistened gauze dressing is still in the right leg ago. 08/14/2021 upon evaluation today patient appears to be doing well with regard to her wound all things considered I do not see any signs of significant infection which is great news. Fortunately there does not appear to be any evidence of infection which is also great news. In general I think that the patient is tolerating the dressing changes well. We been using a Dakin's moistened gauze. 08/28/2021 upon evaluation today patient appears to be doing well with regard to her wound. Worsening signs of definite improvement which is great news and overall very pleased with where we stand today. There is no signs of inflamed tissue or infection which is great as well and I think the Dakin's is doing a great job here. 09/11/2021 upon evaluation today patient appears to be doing well with regard to her wound all things considered. I am can perform some debridement today to clear away some of the necrotic debris with that being said overall I think that she is making decently good progress here which is great news. There does not appear to be any signs of active infection also good news. That in fact is probably the best news in her mind. 09/25/2021 upon evaluation  today patient appears to be doing decently well in regard to her wound. Overall I do not see any signs of infection and I think she is doing quite well. I do believe that we are making headway here although is very slow due to the scarring and the depth of the wound this is a significantly deep wound. 10/16/2021 upon evaluation today patient appears to be doing about the same in regard to the wound on her right chest wall. Fortunately there does not appear to be any signs of active infection locally nor systemically which is great news. With that being said this still was not cleaning up quite as quickly as I would like to see. Fortunately I think that we can definitely give the Santyl another try we tried this in the past and unfortunately did not have a good result but again I think she was infected at that time as well which was part of the issue. Nonetheless I think currently that we will be able to go ahead and see what we can do  about getting this little cleaner with the Santyl. 11/06/2021 upon evaluation today patient appears to be doing well with regard to her wound. This actually appears to be a little bit better with the Santyl I am happy in that regard. Fortunately I do not see any signs of active infection at this time. No fevers, chills, nausea, vomiting, or diarrhea. 12/04/2021 upon evaluation patient appears to be doing well with the Santyl at this point. I am very pleased with where we stand and I think that she is making good progress here. Fortunately I do not see any evidence of active infection locally or systemically at this time which is great news. No fevers, chills, nausea, vomiting, or diarrhea. 01/01/2022 upon evaluation today patient actually is making excellent progress with the Santyl. I am extremely pleased with where we stand today. I do not see any signs of infection. 01-29-2022 upon evaluation today patient appears to be doing well with regard to her wound. This is showing  signs of some improvement. It is a little less deep than what it was. Size wise is also slightly smaller but again there still is a significant wound in this area. Fortunately I do not see any evidence of infection and she is doing quite well. 02-26-2022 upon evaluation today patient appears to be doing well with regard to her wound. In fact this is actually the best that I have seen in a very long time. I do not see any evidence of active infection at this time locally or systemically which is great news and overall I think we are on the right track. Nonetheless I do believe that the patient is continuing to show evidence of improvement with the Santyl week by week. 03-26-2022 upon evaluation today patient appears to be doing well with regard to the wound on her right mastectomy site. She is actually showing signs of excellent improvement in fact there was some bleeding just with a little bit of light cleaning over the area. Fortunately I do not see any signs of active infection locally or systemically at this time which is great news. 04-30-2022 upon evaluation today patient appears to be doing well currently in regard to her wound. She is actually showing some signs of improvement here which is great news. Still this is very very slow. Subsequently I do believe that she may benefit from the use of Keystone topical antibiotics for short amount of time before applying a skin substitute I think EpiCord could be doing very well for her as far as that is concerned. I discussed that with her today. Fortunately I do not see any evidence of active infection locally or systemically at this time which is great news. No fevers, chills, nausea, vomiting, or diarrhea. 05-28-2022 upon evaluation today patient's wound is actually showing signs of significant improvement. Fortunately I do not see any evidence of infection at this time which is great news. No fevers, chills, nausea, vomiting, or diarrhea. With that  being said I do believe that the patient is tolerating the dressing changes without complication. We been using the [facility] which I think is helpful. We do have approval for the EpiCord. 06-11-2022 upon evaluation today patient appears to be doing well with regard to her wound she is actually here today for the first application of EpiCord which I think is good to be beneficial for her. I do think that we will probably be able to keep this in place without can be the one thing of question to  be honest. 06-18-2022 upon evaluation today patient actually appears to be doing excellent today. She has 1 treatment of the EpiCord underway and the wound surface already looks much better. This will be EpiCord #2 today. Fortunately I think we are on the right track here. 06-25-2022 upon evaluation today patient appears to be making good progress and overall the patient seems to have tolerated EpiCord without any complication. I do feel like that the wound is improving quite significantly. This is EpiCord #3 today. 07-02-2021 upon evaluation today patient's wound is actually showing signs of improvement. I am actually very pleased with where we stand we have been seeing some improvements in size overall and I think that we are still in the right track although there is a little bit of need for sharp debridement today to clearway some of the necrotic debris and remaining EpiCord which is actually still somewhat adhered to the wound bed. Overall I am extremely pleased though with where we stand. This is EpiCord #4 today. Upon evaluation today patient appears to be doing well with regard to her wound the EpiCord is definitely helping and does seem to be improving the overall status of the wound the surface is dramatically improved compared to what it was at the start of this. The tissue is actually becoming more red than it is just a pale pink and to be honest some of the did not even appear to be pink in the start.  Nonetheless I am very pleased with where we stand currently. No fevers, chills, nausea, vomiting, or diarrhea. 07-16-2022 upon evaluation today patient appears to be doing well currently in regard to her wound she is doing well with the EpiCord this is application #6 today. 07-23-2022 upon evaluation today patient's wound actually showing signs of excellent improvement which is great news. Fortunately I think were making progress again this is a very scarred area which now has a pretty good vascular supply which is great news. Overall I think that she is making great progress. This is EpiCord #7 today 10/18; Epicort No. 8. Very minimally smaller. Wounds on the right breast mastectomy site complicated by soft tissue radiation damage. Unfortunately she lives in [place], 5 days a week transport would be impossible for her to arrange 08-06-2022 upon evaluation today patient's wound is showing signs again of minimal improvement with regard to the wound bed again this is something that JILL, THIESSEN ([id number]) [id number] Page 8 of 10 would probably respond well to hyperbaric oxygen therapy and I think should we can probably get this approved but she is just not able to make the transportation from [place] here daily for the 76-month period of time. Nonetheless I do think that she seems to be doing much better with regard to the surface of the wound and I am pleased that regard I am going to go ahead and continue with the EpiCord this is #9 application today. 08-13-2022 upon evaluation today patient's wound again is showing signs of improved quality to the tissue in the base of the wound. Fortunately I do not see any evidence of infection which is great news and overall I am extremely pleased in that regard. In general I do believe that the patient is making progress here. No fevers, chills, nausea, vomiting, or diarrhea. She is here today for EpiCord application #10 11/8; patient  with a wound on the lower part of the right breast area. She is apparently had a nice response to epi cord but she has completed  this. It is difficult not to look over this lady's history and look at the wound and wonder about the known benefits of hyperbaric oxygen for soft tissue radionecrosis however she is unable to make transportation arrangements. She lives in [place] 08-27-2022 upon evaluation today patient appears to be doing well in regard to her wound. She has been tolerating the dressing changes without complication wheezing using the silver collagen over the past week and she tolerated that without complication. Fortunately I see no evidence of active infection locally or systemically at this time. 09-17-2022 upon evaluation today patient appears to be doing well currently in regard to her wound. She has been tolerating the dressing changes without complication. Fortunately there does not appear to be any signs of active infection locally or systemically at this time which is great news. No fevers, chills, nausea, vomiting, or diarrhea. 10-15-2022 upon evaluation today patient appears to be doing well currently in regard to her wound. This is showing signs of good improvement which is great news and overall I am extremely pleased with where we stand today. Fortunately there does not appear to be any signs of active infection at this time which is great news as well. No fevers, chills, nausea, vomiting, or diarrhea. 11-12-2022 upon evaluation today patient appears to be doing pretty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

## 2023-10-05 ENCOUNTER — Inpatient Hospital Stay: Payer: Medicare Other

## 2023-10-05 ENCOUNTER — Inpatient Hospital Stay: Payer: Medicare Other | Attending: Hematology & Oncology

## 2023-10-05 DIAGNOSIS — E611 Iron deficiency: Secondary | ICD-10-CM | POA: Diagnosis not present

## 2023-10-05 DIAGNOSIS — Z7982 Long term (current) use of aspirin: Secondary | ICD-10-CM | POA: Insufficient documentation

## 2023-10-05 DIAGNOSIS — D631 Anemia in chronic kidney disease: Secondary | ICD-10-CM | POA: Diagnosis not present

## 2023-10-05 DIAGNOSIS — C50911 Malignant neoplasm of unspecified site of right female breast: Secondary | ICD-10-CM | POA: Diagnosis not present

## 2023-10-05 DIAGNOSIS — Z86718 Personal history of other venous thrombosis and embolism: Secondary | ICD-10-CM | POA: Insufficient documentation

## 2023-10-05 DIAGNOSIS — C50011 Malignant neoplasm of nipple and areola, right female breast: Secondary | ICD-10-CM

## 2023-10-05 DIAGNOSIS — N189 Chronic kidney disease, unspecified: Secondary | ICD-10-CM | POA: Diagnosis not present

## 2023-10-05 DIAGNOSIS — D508 Other iron deficiency anemias: Secondary | ICD-10-CM

## 2023-10-05 DIAGNOSIS — Z79811 Long term (current) use of aromatase inhibitors: Secondary | ICD-10-CM | POA: Diagnosis not present

## 2023-10-05 LAB — CBC WITH DIFFERENTIAL (CANCER CENTER ONLY)
Abs Immature Granulocytes: 0.03 10*3/uL (ref 0.00–0.07)
Basophils Absolute: 0 10*3/uL (ref 0.0–0.1)
Basophils Relative: 1 %
Eosinophils Absolute: 0.2 10*3/uL (ref 0.0–0.5)
Eosinophils Relative: 3 %
HCT: 37.4 % (ref 36.0–46.0)
Hemoglobin: 11.8 g/dL — ABNORMAL LOW (ref 12.0–15.0)
Immature Granulocytes: 0 %
Lymphocytes Relative: 33 %
Lymphs Abs: 2.3 10*3/uL (ref 0.7–4.0)
MCH: 29.4 pg (ref 26.0–34.0)
MCHC: 31.6 g/dL (ref 30.0–36.0)
MCV: 93 fL (ref 80.0–100.0)
Monocytes Absolute: 0.5 10*3/uL (ref 0.1–1.0)
Monocytes Relative: 7 %
Neutro Abs: 3.9 10*3/uL (ref 1.7–7.7)
Neutrophils Relative %: 56 %
Platelet Count: 193 10*3/uL (ref 150–400)
RBC: 4.02 MIL/uL (ref 3.87–5.11)
RDW: 14.3 % (ref 11.5–15.5)
WBC Count: 7 10*3/uL (ref 4.0–10.5)
nRBC: 0 % (ref 0.0–0.2)

## 2023-10-05 LAB — CMP (CANCER CENTER ONLY)
ALT: 13 U/L (ref 0–44)
AST: 14 U/L — ABNORMAL LOW (ref 15–41)
Albumin: 4.1 g/dL (ref 3.5–5.0)
Alkaline Phosphatase: 62 U/L (ref 38–126)
Anion gap: 7 (ref 5–15)
BUN: 22 mg/dL (ref 8–23)
CO2: 30 mmol/L (ref 22–32)
Calcium: 9.2 mg/dL (ref 8.9–10.3)
Chloride: 107 mmol/L (ref 98–111)
Creatinine: 0.66 mg/dL (ref 0.44–1.00)
GFR, Estimated: >60 mL/min (ref >60)
Glucose, Bld: 109 mg/dL — ABNORMAL HIGH (ref 70–99)
Potassium: 4.5 mmol/L (ref 3.5–5.1)
Sodium: 144 mmol/L (ref 135–145)
Total Bilirubin: 0.4 mg/dL (ref <1.2)
Total Protein: 7.3 g/dL (ref 6.5–8.1)

## 2023-10-21 ENCOUNTER — Encounter (HOSPITAL_BASED_OUTPATIENT_CLINIC_OR_DEPARTMENT_OTHER): Payer: Medicare Other | Attending: Physician Assistant | Admitting: Internal Medicine

## 2023-10-21 DIAGNOSIS — L598 Other specified disorders of the skin and subcutaneous tissue related to radiation: Secondary | ICD-10-CM | POA: Diagnosis not present

## 2023-10-21 DIAGNOSIS — Z9221 Personal history of antineoplastic chemotherapy: Secondary | ICD-10-CM | POA: Diagnosis not present

## 2023-10-21 DIAGNOSIS — L98492 Non-pressure chronic ulcer of skin of other sites with fat layer exposed: Secondary | ICD-10-CM | POA: Diagnosis not present

## 2023-10-21 DIAGNOSIS — I87331 Chronic venous hypertension (idiopathic) with ulcer and inflammation of right lower extremity: Secondary | ICD-10-CM | POA: Diagnosis not present

## 2023-10-21 DIAGNOSIS — I10 Essential (primary) hypertension: Secondary | ICD-10-CM | POA: Insufficient documentation

## 2023-10-21 DIAGNOSIS — Z923 Personal history of irradiation: Secondary | ICD-10-CM | POA: Diagnosis not present

## 2023-10-21 DIAGNOSIS — L97311 Non-pressure chronic ulcer of right ankle limited to breakdown of skin: Secondary | ICD-10-CM | POA: Insufficient documentation

## 2023-10-21 DIAGNOSIS — Z9011 Acquired absence of right breast and nipple: Secondary | ICD-10-CM | POA: Diagnosis not present

## 2023-10-22 NOTE — Progress Notes (Signed)
 [name] ([id number]) [id number] Page 1 of 12 Visit Report for 10/21/2023 Chief Complaint Document Details Patient Name: Date of Service: [name] 10/21/2023 9:30 A M Medical Record Number: [id number] Patient Account Number: [id number] Date of Birth/Sex: Treating RN: 1951/03/31 (73 y.o. 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 [name] Primary Care Provider: Trinidad Glisson Other Clinician: Referring Provider: Treating Provider/Extender: Rosan Harlene Trinidad Glisson Devra in Treatment: 165 Debridement Performed for Assessment: Wound #1 Right Breast (mastectomy site) Performed By: Physician Rosan Harlene, DO The following information was scribed by: Claven [name] The information was scribed for: Rosan Harlene Debridement Type: Debridement Level of Consciousness (Pre-procedure): Awake and Alert Pre-procedure Verification/Time Out Yes - 09:59 Taken: Start Time: 09:59 Pain Control: Lidocaine 4% T opical Solution Percent of Wound Bed Debrided: 100% T Area Debrided (cm): otal 4.24 Tissue and other material debrided: Non-Viable, Slough, Slough Level: Non-Viable Tissue Debridement Description: Selective/Open Wound Instrument: Curette Bleeding:  Minimum Hemostasis Achieved: Pressure Response to Treatment: Procedure was tolerated well Level of Consciousness (Post- Awake and Alert procedure): Post Debridement Measurements of Total Wound Length: (cm) 1.8 Width: (cm) 3 Depth: (cm) 0.5 Volume: (cm) 2.121 Character of Wound/Ulcer Post Debridement: Improved Post Procedure Diagnosis [name] ([id number]) [id number] Page 2 of 12 Same as Pre-procedure Electronic Signature(s) Signed: 10/21/2023 5:26:35 PM By: Rosan Harlene DO Signed: 10/21/2023 6:29:34 PM By: Claven Pollen RN Entered By: Claven [name] on 10/21/2023 07:27:49 -------------------------------------------------------------------------------- HPI Details Patient Name: Date of Service: [name] 10/21/2023 9:30 A M Medical Record Number: [id number] Patient Account Number: [id number] Date of Birth/Sex: Treating RN: 04-14-1951 (73 y.o. F) Primary Care Provider: Trinidad Glisson Other Clinician: Referring Provider: Treating Provider/Extender: Rosan Harlene Trinidad Glisson Devra in Treatment: 165 History of Present Illness HPI Description: 08/22/2020 upon evaluation today patient actually appears to be doing somewhat poorly in regard to her mastectomy site on the right chest wall. She had a mastectomy initially in 1998. She had chemotherapy only no radiation following. In 2002 she subsequently did undergo radiation for the first time and then subsequently in 2012 had repeat radiation after having had a finding that was consistent with a relapse of the cancer. 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 She also has radiation damage to the skin over the right chest wall where she had the mastectomy. She was referred to us  by Dr. Johnnye in [facility] and her oncologist is Dr. Maude Crease. Subsequently I want to make sure before we delve into this more deeply that the patient does  not have any issues with a return of cancer that needs to be managed by them. 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 Nonetheless she does not remember exactly when her last PET scan was but it was not too recently she tells me. She does have a history of a DVT in  the right leg in 2013. The patient does have hypertension as well. She sees her oncologist next on December 6. 09/12/2020 on evaluation today patient actually appears to be doing excellent in regard to her wound under the right breast location. Currently this is measuring smaller in general seems to be doing very well. She tells me that the collagen is doing a good job and that the dressing that she has been putting on is not causing any troubles as far pulling on her skin or scar tissue is concerned all of which is excellent news. 10/03/2020 upon evaluation today patient appears to be doing well with regard to her surgical site from her mastectomy on the right breast region. She tells me that she has had very little bleeding nothing seems to be sticking badly and in general she has been extremely pleased with where things stand today. No fevers, chills, nausea, vomiting, or diarrhea. 10/24/2020 upon evaluation today patient actually appears to be doing excellent in regard to her wound. There is just a very small area still open and to be honest there was minimal slough noted on the surface of the wound nothing that requires sharp debridement. In general I feel like she is actually doing quite well and overall I am pleased. I do think we may switch to silver  alginate dressing and also contemplating using a AandE ointment in order to help keep everything moist and the scar tissue region while the alginate keeps it dry enough to heal 11/14/2020 upon evaluation today patient appears to be doing well  at this point in regard to her wound. I do feel like that she is making good progress again this is a very difficult region over the surgical site of the right chest wall at the site of mastectomy. Nonetheless I think that we are just a very small area still open I think alginate is doing a good job helping to dry this up and I would recommend this such that we continue with this. 01/02/2021 on evaluation today patient's wound actually appears to be doing decently well. There does not appear to be any signs of active infection which is great news. She does have some slight slough buildup with biofilm on the surface of the wound mainly more biofilm. Nonetheless I was able to gently remove this with saline and gauze as well as a sterile Q-tip. She tolerated that today without complication. 01/30/2021 upon evaluation today patient appears to be doing well with regard to her wound although she still has quite a bit of trouble getting this to close I think the scar tissue and radiation damage is quite significant here unfortunately. There does not appear to be any signs of active infection which is great news. No fevers, chills, nausea, vomiting, or diarrhea. 02/27/2021 upon evaluation today patient appears to be doing excellent in regard to her wound. She has been tolerating the dressing changes without complication and in general I am extremely pleased with where things stand today. There does not appear to be any signs of infection which is great news. No fevers, chills, nausea, vomiting, or diarrhea. With that being said I do think that she still developing some slough buildup. I did discuss with her today the possibility of proceeding with HBO therapy. We have had this discussion before but she really is not quite committed to wanting to do that much and spend that much time in the chamber. She wants to still think about it. 03/27/2021 upon evaluation today patient appears  to be doing about the same in  regard to her wound. She still developing a lot of slough buildup on the surface of the wound. I did try to clean that away to some degree today. She tolerated that without any pain or complication. Subsequently I am thinking we may want to switch over to Santyl see if this may do better for her. Patient is in agreement with giving that a trial. 05/01/2021 upon evaluation today patient appears to be doing about the same in regard to her wounds. She has been tolerating the dressing changes without complication. Fortunately there does not appear to be any signs of active infection at this time which is great news. No fevers, chills, nausea, vomiting, or diarrhea.. 06/05/2021 upon evaluation today patient appears to be doing pretty well currently in regard to her wound at the mastectomy site right chest wall. Overall since we switch back to the alginate she tells me things have significantly improved she is much happier with where things stand currently. Fortunately there does not appear to be any signs of active infection which is great news. No fevers, chills, nausea, vomiting, or diarrhea. 07/10/2021 upon evaluation today patient unfortunately does not appear to be doing nearly as well as what she previously was. There does not appear to be any evidence of new epithelial growth she has a lot of necrotic tissue the base of the wound this is much more significant than what we previously noted. She also has been having increased pain and increased drainage. In general I am very concerned about this especially in light of the fact that she does have a history of breast cancer I want to ensure that were not looking at any cancerous type lesion at this point. Otherwise also think she does need some debridement we did [place], NICHOLE ([id number]) [id number] Page 3 of 12 do MolecuLight scanning today. 07/17/2021 upon evaluation today patient appears to be doing well with regard to her  wound compared to last week although she still having some erythema I am concerned in this regard. I do think that she fortunately had a negative biopsy which is great news unfortunately she did have Pseudomonas noted as a bacteria present in the culture which I think is good and need to be addressed with Levaquin. I Minna send that into the pharmacy today. We did repeat the MolecuLight screening. 07/31/2021 upon evaluation today patient's wound is actually showing signs of improvement which is great news. Fortunately there does not appear to be any signs of infection currently locally nor systemically and I am very pleased in that regard. With that being said the patient is having some issues here with continued necrotic tissue I think packing with the Dakin's moistened gauze dressing is still in the right leg ago. 08/14/2021 upon evaluation today patient appears to be doing well with regard to her wound all things considered I do not see any signs of significant infection which is great news. Fortunately there does not appear to be any evidence of infection which is also great news. In general I think that the patient is tolerating the dressing changes well. We been using a Dakin's moistened gauze. 08/28/2021 upon evaluation today patient appears to be doing well with regard to her wound. Worsening signs of definite improvement which is great news and overall very pleased with where we stand today. There is no signs of inflamed tissue or infection which is great as well and I think the Dakin's is doing a great  job here. 09/11/2021 upon evaluation today patient appears to be doing well with regard to her wound all things considered. I am can perform some debridement today to clear away some of the necrotic debris with that being said overall I think that she is making decently good progress here which is great news. There does not appear to be any signs of active infection also good news. That in fact  is probably the best news in her mind. 09/25/2021 upon evaluation today patient appears to be doing decently well in regard to her wound. Overall I do not see any signs of infection and I think she is doing quite well. I do believe that we are making headway here although is very slow due to the scarring and the depth of the wound this is a significantly deep wound. 10/16/2021 upon evaluation today patient appears to be doing about the same in regard to the wound on her right chest wall. Fortunately there does not appear to be any signs of active infection locally nor systemically which is great news. With that being said this still was not cleaning up quite as quickly as I would like to see. Fortunately I think that we can definitely give the Santyl another try we tried this in the past and unfortunately did not have a good result but again I think she was infected at that time as well which was part of the issue. Nonetheless I think currently that we will be able to [name] ahead and see what we can do about getting this little cleaner with the Santyl. 11/06/2021 upon evaluation today patient appears to be doing well with regard to her wound. This actually appears to be a little bit better with the Santyl I am happy in that regard. Fortunately I do not see any signs of active infection at this time. No fevers, chills, nausea, vomiting, or diarrhea. 12/04/2021 upon evaluation patient appears to be doing well with the Santyl at this point. I am very pleased with where we stand and I think that she is making good progress here. Fortunately I do not see any evidence of active infection locally or systemically at this time which is great news. No fevers, chills, nausea, vomiting, or diarrhea. 01/01/2022 upon evaluation today patient actually is making excellent progress with the Santyl. I am extremely pleased with where we stand today. I do not see any signs of infection. 01-29-2022 upon evaluation today patient  appears to be doing well with regard to her wound. This is showing signs of some improvement. It is a little less deep than what it was. Size wise is also slightly smaller but again there still is a significant wound in this area. Fortunately I do not see any evidence of infection and she is doing quite well. 02-26-2022 upon evaluation today patient appears to be doing well with regard to her wound. In fact this is actually the best that I have seen in a very long time. I do not see any evidence of active infection at this time locally or systemically which is great news and overall I think we are on the right track. Nonetheless I do believe that the patient is continuing to show evidence of improvement with the Santyl week by week. 03-26-2022 upon evaluation today patient appears to be doing well with regard to the wound on her right mastectomy site. She is actually showing signs of excellent improvement in fact there was some bleeding just with a little bit of  light cleaning over the area. Fortunately I do not see any signs of active infection locally or systemically at this time which is great news. 04-30-2022 upon evaluation today patient appears to be doing well currently in regard to her wound. She is actually showing some signs of improvement here which is great news. Still this is very very slow. Subsequently I do believe that she may benefit from the use of Keystone topical antibiotics for short amount of time before applying a skin substitute I think EpiCord could be doing very well for her as far as that is concerned. I discussed that with her today. Fortunately I do not see any evidence of active infection locally or systemically at this time which is great news. No fevers, chills, nausea, vomiting, or diarrhea. 05-28-2022 upon evaluation today patient's wound is actually showing signs of significant improvement. Fortunately I do not see any evidence of infection at this time which is great  news. No fevers, chills, nausea, vomiting, or diarrhea. With that being said I do believe that the patient is tolerating the dressing changes without complication. We been using the [facility] which I think is helpful. We do have approval for the EpiCord. 06-11-2022 upon evaluation today patient appears to be doing well with regard to her wound she is actually here today for the first application of EpiCord which I think is good to be beneficial for her. I do think that we will probably be able to keep this in place without can be the one thing of question to be honest. 06-18-2022 upon evaluation today patient actually appears to be doing excellent today. She has 1 treatment of the EpiCord underway and the wound surface already looks much better. This will be EpiCord #2 today. Fortunately I think we are on the right track here. 06-25-2022 upon evaluation today patient appears to be making good progress and overall the patient seems to have tolerated EpiCord without any complication. I do feel like that the wound is improving quite significantly. This is EpiCord #3 today. 07-02-2021 upon evaluation today patient's wound is actually showing signs of improvement. I am actually very pleased with where we stand we have been seeing some improvements in size overall and I think that we are still in the right track although there is a little bit of need for sharp debridement today to clearway some of the necrotic debris and remaining EpiCord which is actually still somewhat adhered to the wound bed. Overall I am extremely pleased though with where we stand. This is EpiCord #4 today. Upon evaluation today patient appears to be doing well with regard to her wound the EpiCord is definitely helping and does seem to be improving the overall status of the wound the surface is dramatically improved compared to what it was at the start of this. The tissue is actually becoming more red than it is just a pale pink and to be  honest some of the did not even appear to be pink in the start. Nonetheless I am very pleased with where we stand currently. No fevers, chills, nausea, vomiting, or diarrhea. 07-16-2022 upon evaluation today patient appears to be doing well currently in regard to her wound she is doing well with the EpiCord this is application #6 today. 07-23-2022 upon evaluation today patient's wound actually showing signs of excellent improvement which is great news. Fortunately I think were making progress again this is a very scarred area which now has a pretty good vascular supply which is great  news. Overall I think that she is making great progress. This is EpiCord #7 today 10/18; Epicort No. 8. Very minimally smaller. Wounds on the right breast mastectomy site complicated by soft tissue radiation damage. Unfortunately she lives in [place], 5 days a week transport would be impossible for her to arrange ILLIANA, LOSURDO ([id number]) [phone].pdf Page 4 of 12 08-06-2022 upon evaluation today patient's wound is showing signs again of minimal improvement with regard to the wound bed again this is something that would probably respond well to hyperbaric oxygen therapy and I think should we can probably get this approved but she is just not able to make the transportation from [place] here daily for the 61-month period of time. Nonetheless I do think that she seems to be doing much better with regard to the surface of the wound and I am pleased that regard I am going to [name] ahead and continue with the EpiCord this is #9 application today. 08-13-2022 upon evaluation today patient's wound again is showing signs of improved quality to the tissue in the base of the wound. Fortunately I do not see any evidence of infection which is great news and overall I am extremely pleased in that regard. In general I do believe that the patient is making progress here. No fevers, chills, nausea, vomiting, or  diarrhea. She is here today for EpiCord application #10 11/8; patient with a wound on the lower part of the right breast area. She is apparently had a nice response to epi cord but she has completed this. It is difficult not to look over this lady's history and look at the wound and wonder about the known benefits of hyperbaric oxygen for soft tissue radionecrosis however she is unable to make transportation arrangements. She lives in [place] 08-27-2022 upon evaluation today patient appears to be doing well in regard to her wound. She has been tolerating the dressing changes without complication wheezing using the silver  collagen over the past week and she tolerated that without complication. Fortunately I see no evidence of active infection locally or systemically at this time. 09-17-2022 upon evaluation today patient appears to be doing well currently in regard to her wound. She has been tolerating the dressing changes without complication. Fortunately there does not appear to be any signs of active infection locally or systemically at this time which is great news. No fevers, chills, nausea, vomiting, or diarrhea. 10-15-2022 upon evaluation today patient appears to be doing well currently in regard to her wound. This is showing signs of good improvement which is great news and overall I am extremely pleased with where we stand today. Fortunately there does not appear to be any signs of active infection at this time which is great news as well. No fevers, chills, nausea, vomiting, or diarrhea. 11-12-2022 upon evaluation today patient appears to be doing pretty well in regard to her wound. She has been tolerating the dressing changes without complication and fortunately there does not appear to be any signs of active infection locally nor systemically which is great news. No fevers, chills, nausea, vomiting, or diarrhea. 12-10-2022 upon evaluation today patient appears to be doing well currently in  regard to her wound. She has been tolerating the dressing changes without complication. Fortunately there does not appear to be any signs of active infection locally nor systemically which is great news. No fevers, chills, nausea, vomiting, or diarrhea. 01-07-2023 upon evaluation today patient appears to be doing actually better in regard to her wound I am  actually very pleased with where things stand. The size is about the same but the base of the wound actually appears to be healthier with more red granulation tissue and there is some slough and biofilm I am going to perform debridement to clear some of this wound today for continuing with collagen at this point she is doing a good job taking care of this. 02-04-2023 upon evaluation today patient appears to be doing well currently in regard to her wound. She has been tolerating the dressing changes without complication in general I really feel like they were making excellent progress here. Fortunately I do not see any signs of active infection locally or systemically which is great news. She is going require some debridement but overall I think that though slow this still continues to make some progress little by little. 03-11-2023 upon evaluation today patient's wound is doing roughly about the same. I do not see any signs of overall worsening which is great news and I am very pleased in that regard. With that being said I do believe that she does have a issue here with ongoing radiation damage to this region which is making it very hard to get the close but again we will continue to work as best we can on this. 04-08-2023 upon evaluation today patient appears to be doing well currently in regard to her wound. She has been tolerating the dressing changes without complication. Fortunately I do not see any signs of active infection locally or systemically at this time. 7/24; monthly visit for this patient who has an open wound in the right breast area  largely secondary to soft tissue radionecrosis. She apparently ran a course of epi cord without any improvement now has been on silver  collagen for a while without obvious improvement either at least in terms of wound dimensions. She has no other complaints today. She changes her dressing every second day 06-03-2023 upon evaluation today patient appears to be doing well currently in regard to her wound. She has been tolerating the dressing changes without complication. Fortunately I do not see any signs of active infection locally nor systemically at this time. 07-01-2023 upon evaluation today patient appears to be doing pretty well in regard to her wound in the right breast location where she has had a right breast removal. Subsequently she tells me that she is continuing to do well with the dressings at this location which is good news. With that being said unfortunately she does have issues of swelling in the right lower extremity. She also has a wound on the medial ankle very superficial but nonetheless that is a concern at this point. I discussed with the patient that I do believe that we can manage this for her at this point to get this healed. She voiced understanding. With that being said I am going to see about getting her initiated with a compression wrap. I think that is probably good to be the best option here for her. 07-08-2023 upon evaluation today patient appears to be doing well currently in regard to her wounds. She has been tolerating the dressing changes without complication. Fortunately I do not see any evidence of worsening overall I do believe that the patient is making good headway towards complete closure. 07/15/2023 upon evaluation today patient appears to be doing excellent in regard to her right leg ulcer this is completely healed. I am actually very pleased with where things stand currently. 08-12-2023 upon evaluation today patient appears to be doing  well currently in regard  to her wound which is actually showing some signs of improvement. Fortunately I do not see any evidence of worsening overall believe that the patient is making good headway towards closure which is good news. 12/11; this is a patient we follow chronically for a wound on her right anterior chest after being treated with radiation for breast cancer. She had 2 separate courses of radiation she tells me. She has a quarter sized punched-out area. The depth of this goes right against the chest wall. She has been using Hydrofera Blue, prior to this had a prolonged course of Santyl 10/21/2023; patient presents for follow-up. She is using Hydrofera Blue to the wound site. He has no issues or complaints today. She denies signs of infection. She has soft tissue radionecrosis and we discussed potentially doing a wound VAC and hyperbaric oxygen therapy. She lives in [place] and states she cannot travel for HBO at this time. She will think about using a wound VAC. Electronic Signature(s) Signed: 10/21/2023 5:26:35 PM By: Rosan Raisin DO Entered By: Rosan Raisin on 10/21/2023 09:12:11 [name] ([id number]) [id number] Page 5 of 12 -------------------------------------------------------------------------------- Physical Exam Details Patient Name: Date of Service: [name] 10/21/2023 9:30 A M Medical Record Number: [id number] Patient Account Number: [id number] Date of Birth/Sex: Treating RN: 03/05/1951 (73 y.o. F) Primary Care Provider: Trinidad Glisson Other Clinician: Referring Provider: Treating Provider/Extender: Rosan Raisin Trinidad Glisson Weeks in Treatment: 165 Constitutional respirations regular, non-labored and within target range for patient.. Cardiovascular 2+ dorsalis pedis/posterior tibialis pulses. Psychiatric pleasant and cooperative. Notes T the right chest wall there is an open wound with granulation tissue and slough. Soft tissue radiation  skin changes to the surrounding area. No signs of o infection. Electronic Signature(s) Signed: 10/21/2023 5:26:35 PM By: Rosan Raisin DO Entered By: Rosan Raisin on 10/21/2023 09:17:54 -------------------------------------------------------------------------------- Physician Orders Details Patient Name: Date of Service: [name] 10/21/2023 9:30 A M Medical Record Number: [id number] Patient Account Number: [id number] Date of Birth/Sex: Treating RN: [birth year] (73 y.o. [name] Primary Care Provider: Trinidad Glisson Other Clinician: Referring Provider: Treating Provider/Extender: Rosan Raisin Trinidad Glisson Devra in Treatment: [phone] Verbal / Phone Orders: No Diagnosis Coding Follow-up Appointments Return appointment in 1 month. - Dr. Collen Hostler Anesthetic (In clinic) Topical Lidocaine 4% applied to wound bed Cellular or Tissue Based Products daptic or Mepitel. (DO NOT REMOVE). - Cellular or Tissue Based Product applied to wound bed, secured with steri-strips, cover with A Already Done- Epicord #1 06/11/22, #2 06/18/22, #3 06/25/22, #4 07/02/22, #5 07/09/22, #6 07/16/22, Epicord #7 07/23/22, Epicord #8 07/30/22, Epicord #9 08/06/2022. Epicord # 10 08/13/22 - no more after 10th application, per PA we will see how she does after this with collagen for now. Bathing/ Shower/ Hygiene May shower and wash wound with soap and water. Additional Orders / Instructions Follow Nutritious Diet Other: - wear compression stockings daily. Please consider what the Doctor discussed with you regarding the Wound Vac (Negative Pressure Wound Therapy) Wound Treatment Wound #1 - Breast (mastectomy site) Wound Laterality: Right Cleanser: Wound Cleanser (Generic) Every Other Day/30 Days Discharge Instructions: Cleanse the wound with wound cleanser prior to applying a clean dressing using gauze sponges, not tissue or cotton balls. [name], [name] ([id number])  [id number] Page 6 of 12 Cleanser: Byram Ancillary Kit - 15 Day Supply (Generic) Every Other Day/30 Days Discharge Instructions: Use supplies as instructed; Kit contains: (15) Saline Bullets; (15) 3x3 Gauze; 15 pr Gloves Cleanser: Soap and Water Every  Other Day/30 Days Discharge Instructions: May shower and wash wound with dial antibacterial soap and water prior to dressing change. Peri-Wound Care: Skin Prep (Generic) Every Other Day/30 Days Discharge Instructions: Use skin prep as directed Prim Dressing: Hydrofera Blue Ready Transfer Foam, 2.5x2.5 (in/in) (Dispense As Written) Every Other Day/30 Days ary Discharge Instructions: Apply directly to wound bed , cut to fit inside wound edges Secondary Dressing: Woven Gauze Sponge, Non-Sterile 4x4 in (Generic) Every Other Day/30 Days Discharge Instructions: Apply over primary dressing as directed. Secondary Dressing: Woven Gauze Sponges 2x2 in (Generic) Every Other Day/30 Days Discharge Instructions: Apply over primary dressing, fold in corners to fill the space Secondary Dressing: Zetuvit Plus Silicone Border Dressing 5x5 (in/in) (Generic) Every Other Day/30 Days Discharge Instructions: Apply silicone border over primary dressing as directed. Electronic Signature(s) Signed: 10/21/2023 5:26:35 PM By: Rosan Raisin DO Entered By: Rosan Raisin on 10/21/2023 09:18:05 -------------------------------------------------------------------------------- Problem List Details Patient Name: Date of Service: [name] 10/21/2023 9:30 A M Medical Record Number: [id number] Patient Account Number: [id number] Date of Birth/Sex: Treating RN: 03/25/1951 (73 y.o. F) Primary Care Provider: Trinidad Glisson Other Clinician: Referring Provider: Treating Provider/Extender: Rosan Raisin Trinidad Glisson Devra in Treatment: [phone] Active Problems ICD-10 Encounter Code Description Active Date MDM Diagnosis L98.492 Non-pressure  chronic ulcer of skin of other sites with fat layer exposed 08/22/2020 No Yes L58.9 Radiodermatitis, unspecified 08/22/2020 No Yes L59.8 Other specified disorders of the skin and subcutaneous tissue related to 08/22/2020 No Yes radiation I87.331 Chronic venous hypertension (idiopathic) with ulcer and inflammation of right 07/01/2023 No Yes lower extremity 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 She had a mastectomy initially in 1998. She had chemotherapy only no radiation following. In 2002 she subsequently did undergo radiation for the first time and then subsequently in 2012 had repeat radiation after having had a finding that was consistent with a relapse of the cancer. 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 She  also has radiation damage to the skin over the right chest wall where she had the mastectomy. She was referred to us  by Dr. Johnnye in [facility] and her oncologist is Dr. Maude Crease. Subsequently I want to make sure before we delve into this more deeply that the patient does not have any issues with a return of cancer that needs to be managed by them. 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 Nonetheless she does not remember exactly when her last PET scan was but  it was not too recently she tells me. 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 The patient does have hypertension as well. She sees her oncologist next on December 6. 09/12/2020 on evaluation today patient actually appears to be doing excellent in regard to her wound under the right breast location. Currently this is measuring smaller in general seems to be doing very well. She tells me that the collagen is doing a good job and that the dressing that she has been putting on is not causing any troubles as far pulling on her skin or scar tissue is concerned all of which is excellent news. 10/03/2020 upon evaluation today patient appears to be doing well with regard to her surgical site from her mastectomy on the right breast region. She tells me that she has had very little bleeding nothing seems to be sticking badly and in general she has been extremely pleased with where things stand today. No fevers, chills, nausea, vomiting, or diarrhea. 10/24/2020 upon evaluation today patient actually appears to be doing excellent in regard to her wound. There is just a very small area still open and to be honest there was minimal slough noted on the surface of the wound nothing that requires sharp debridement. In general I feel like she is actually doing quite well and overall I  am pleased. I do think we may switch to silver  alginate dressing and also contemplating using a AandE ointment in order to help keep everything moist and the scar tissue region while the alginate keeps it dry enough to heal 11/14/2020 upon evaluation today patient appears to be doing well at this point in regard to her wound. I do feel like that she is making good progress again this is a very difficult region over the surgical site of the right chest wall at the site of mastectomy. Nonetheless I think that we are just a very small area still open I think alginate is doing a good job helping to dry this up and I would recommend this such that we continue with this. 01/02/2021 on evaluation today patient's wound actually appears to be doing decently well. There does not appear to be any signs of active infection which is great news. She does have some slight slough buildup with biofilm on the surface of the wound mainly more biofilm. Nonetheless I was able to gently remove this with saline and gauze as well as a sterile Q-tip. She tolerated that today without complication. 01/30/2021 upon evaluation today patient appears to be doing well with regard to her wound although she still has quite a bit of trouble getting this to close I think the scar tissue and radiation damage is quite significant here unfortunately. There does not appear to be any signs of active infection which is great news. No fevers, chills, nausea, vomiting, or diarrhea. 02/27/2021 upon evaluation today patient appears to be doing excellent in regard to her wound. She has been tolerating the dressing changes without complication and in general I am extremely pleased with where things stand today. There does not appear to be any signs of infection which is great news. No fevers, chills, nausea, vomiting, or diarrhea. With that being said I do think that she still developing some slough buildup. I did discuss with her today the possibility  of proceeding with HBO therapy. We have had this discussion before but she really is not quite committed to wanting to do that much and spend that  much time in the chamber. She wants to still think about it. 03/27/2021 upon evaluation today patient appears to be doing about the same in regard to her wound. She still developing a lot of slough buildup on the surface of the wound. I did try to clean that away to some degree today. She tolerated that without any pain or complication. Subsequently I am thinking we may want to switch over to Santyl see if this may do better for her. Patient is in agreement with giving that a trial. 05/01/2021 upon evaluation today patient appears to be doing about the same in regard to her wounds. She has been tolerating the dressing changes without complication. Fortunately there does not appear to be any signs of active infection at this time which is great news. No fevers, chills, nausea, vomiting, or diarrhea.. 06/05/2021 upon evaluation today patient appears to be doing pretty well currently in regard to her wound at the mastectomy site right chest wall. Overall since [place], NICHOLE ([id number]) [id number] Page 8 of 12 we switch back to the alginate she tells me things have significantly improved she is much happier with where things stand currently. Fortunately there does not appear to be any signs of active infection which is great news. No fevers, chills, nausea, vomiting, or diarrhea. 07/10/2021 upon evaluation today patient unfortunately does not appear to be doing nearly as well as what she previously was. There does not appear to be any evidence of new epithelial growth she has a lot of necrotic tissue the base of the wound this is much more significant than what we previously noted. She also has been having increased pain and increased drainage. In general I am very concerned about this especially in light of the fact that she does have a  history of breast cancer I want to ensure that were not looking at any cancerous type lesion at this point. Otherwise also think she does need some debridement we did do MolecuLight scanning today. 07/17/2021 upon evaluation today patient appears to be doing well with regard to her wound compared to last week although she still having some erythema I am concerned in this regard. I do think that she fortunately had a negative biopsy which is great news unfortunately she did have Pseudomonas noted as a bacteria present in the culture which I think is good and need to be addressed with Levaquin. I Minna send that into the pharmacy today. We did repeat the MolecuLight screening. 07/31/2021 upon evaluation today patient's wound is actually showing signs of improvement which is great news. Fortunately there does not appear to be any signs of infection currently locally nor systemically and I am very pleased in that regard. With that being said the patient is having some issues here with continued necrotic tissue I think packing with the Dakin's moistened gauze dressing is still in the right leg ago. 08/14/2021 upon evaluation today patient appears to be doing well with regard to her wound all things considered I do not see any signs of significant infection which is great news. Fortunately there does not appear to be any evidence of infection which is also great news. In general I think that the patient is tolerating the dressing changes well. We been using a Dakin's moistened gauze. 08/28/2021 upon evaluation today patient appears to be doing well with regard to her wound. Worsening signs of definite improvement which is great news and overall very pleased with where we stand today. There is no signs of inflamed  tissue or infection which is great as well and I think the Dakin's is doing a great job here. 09/11/2021 upon evaluation today patient appears to be doing well with regard to her wound all things  considered. I am can perform some debridement today to clear away some of the necrotic debris with that being said overall I think that she is making decently good progress here which is great news. There does not appear to be any signs of active infection also good news. That in fact is probably the best news in her mind. 09/25/2021 upon evaluation today patient appears to be doing decently well in regard to her wound. Overall I do not see any signs of infection and I think she is doing quite well. I do believe that we are making headway here although is very slow due to the scarring and the depth of the wound this is a significantly deep wound. 10/16/2021 upon evaluation today patient appears to be doing about the same in regard to the wound on her right chest wall. Fortunately there does not appear to be any signs of active infection locally nor systemically which is great news. With that being said this still was not cleaning up quite as quickly as I would like to see. Fortunately I think that we can definitely give the Santyl another try we tried this in the past and unfortunately did not have a good result but again I think she was infected at that time as well which was part of the issue. Nonetheless I think currently that we will be able to [name] ahead and see what we can do about getting this little cleaner with the Santyl. 11/06/2021 upon evaluation today patient appears to be doing well with regard to her wound. This actually appears to be a little bit better with the Santyl I am happy in that regard. Fortunately I do not see any signs of active infection at this time. No fevers, chills, nausea, vomiting, or diarrhea. 12/04/2021 upon evaluation patient appears to be doing well with the Santyl at this point. I am very pleased with where we stand and I think that she is making good progress here. Fortunately I do not see any evidence of active infection locally or systemically at this time which is  great news. No fevers, chills, nausea, vomiting, or diarrhea. 01/01/2022 upon evaluation today patient actually is making excellent progress with the Santyl. I am extremely pleased with where we stand today. I do not see any signs of infection. 01-29-2022 upon evaluation today patient appears to be doing well with regard to her wound. This is showing signs of some improvement. It is a little less deep than what it was. Size wise is also slightly smaller but again there still is a significant wound in this area. Fortunately I do not see any evidence of infection and she is doing quite well. 02-26-2022 upon evaluation today patient appears to be doing well with regard to her wound. In fact this is actually the best that I have seen in a very long time. I do not see any evidence of active infection at this time locally or systemically which is great news and overall I think we are on the right track. Nonetheless I do believe that the patient is continuing to show evidence of improvement with the Santyl week by week. 03-26-2022 upon evaluation today patient appears to be doing well with regard to the wound on her right mastectomy site. She is actually  showing signs of excellent improvement in fact there was some bleeding just with a little bit of light cleaning over the area. Fortunately I do not see any signs of active infection locally or systemically at this time which is great news. 04-30-2022 upon evaluation today patient appears to be doing well currently in regard to her wound. She is actually showing some signs of improvement here which is great news. Still this is very very slow. Subsequently I do believe that she may benefit from the use of Keystone topical antibiotics for short amount of time before applying a skin substitute I think EpiCord could be doing very well for her as far as that is concerned. I discussed that with her today. Fortunately I do not see any evidence of active infection  locally or systemically at this time which is great news. No fevers, chills, nausea, vomiting, or diarrhea. 05-28-2022 upon evaluation today patient's wound is actually showing signs of significant improvement. Fortunately I do not see any evidence of infection at this time which is great news. No fevers, chills, nausea, vomiting, or diarrhea. With that being said I do believe that the patient is tolerating the dressing changes without complication. We been using the [facility] which I think is helpful. We do have approval for the EpiCord. 06-11-2022 upon evaluation today patient appears to be doing well with regard to her wound she is actually here today for the first application of EpiCord which I think is good to be beneficial for her. I do think that we will probably be able to keep this in place without can be the one thing of question to be honest. 06-18-2022 upon evaluation today patient actually appears to be doing excellent today. She has 1 treatment of the EpiCord underway and the wound surface already looks much better. This will be EpiCord #2 today. Fortunately I think we are on the right track here. 06-25-2022 upon evaluation today patient appears to be making good progress and overall the patient seems to have tolerated EpiCord without any complication. I do feel like that the wound is improving quite significantly. This is EpiCord #3 today. 07-02-2021 upon evaluation today patient's wound is actually showing signs of improvement. I am actually very pleased with where we stand we have been seeing some improvements in size overall and I think that we are still in the right track although there is a little bit of need for sharp debridement today to clearway some of the necrotic debris and remaining EpiCord which is actually still somewhat adhered to the wound bed. Overall I am extremely pleased though with where we stand. This is EpiCord #4 today. Upon evaluation today patient appears to be doing  well with regard to her wound the EpiCord is definitely helping and does seem to be improving the overall status of the wound the surface is dramatically improved compared to what it was at the start of this. The tissue is actually becoming more red than it is just a pale pink and to be honest some of the did not even appear to be pink in the start. Nonetheless I am very pleased with where we stand currently. No fevers, chills, nausea, vomiting, or diarrhea. 07-16-2022 upon evaluation today patient appears to be doing well currently in regard to her wound she is doing well with the EpiCord this is application #6 today. [name], [name] ([id number]) [id number] Page 9 of 12 07-23-2022 upon evaluation today patient's wound actually showing signs of excellent improvement which is great  news. Fortunately I think were making progress again this is a very scarred area which now has a pretty good vascular supply which is great news. Overall I think that she is making great progress. This is EpiCord #7 today 10/18; Epicort No. 8. Very minimally smaller. Wounds on the right breast mastectomy site complicated by soft tissue radiation damage. Unfortunately she lives in [place], 5 days a week transport would be impossible for her to arrange 08-06-2022 upon evaluation today patient's wound is showing signs again of minimal improvement with regard to the wound bed again this is something that would probably respond well to hyperbaric oxygen therapy and I think should we can probably get this approved but she is just not able to make the transportation from aspirate here daily for the 8-month period of time. Nonetheless I do think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

## 2023-10-27 ENCOUNTER — Encounter: Payer: Self-pay | Admitting: Hematology & Oncology

## 2023-10-28 NOTE — Progress Notes (Signed)
 [name], [name] ([id number]) [id number] Page 1 of 7 Visit Report for 10/21/2023 Arrival Information Details Patient Name: Date of Service: [name], [place] [name] 10/21/2023 9:30 A M Medical Record Number: [id number] Patient Account Number: [id number] Date of Birth/Sex: Treating RN: October 08, 1951 (73 y.o. F) Primary Care Maryann Mccall: Trinidad Glisson Other Clinician: Referring Myrian Botello: Treating Harish Bram/Extender: Rosan Harlene Trinidad Glisson Devra in Treatment: 165 Visit Information History Since Last Visit Added or deleted any medications: No Patient Arrived: Ambulatory Any new allergies or adverse reactions: No Arrival Time: 09:23 Had a fall or experienced change in No Accompanied By: self activities of daily living that may affect Transfer Assistance: None risk of falls: Patient Identification Verified: Yes Signs or symptoms of abuse/neglect since last visito No Secondary Verification Process Completed: Yes Hospitalized since last visit: No Patient Requires Transmission-Based Precautions: No Implantable device outside of the clinic excluding No Patient Has Alerts: No cellular tissue based products placed in the center since last visit: Has Dressing in Place as Prescribed: Yes Pain Present Now: No Electronic Signature(s) Signed: 10/26/2023 5:23:50 PM By: Wyn Iha Entered By: Wyn Iha on 10/21/2023 09:24:12 -------------------------------------------------------------------------------- Encounter Discharge Information Details Patient Name: Date of Service: [name], [place] [name] 10/21/2023 9:30 A M Medical Record Number: [id number] Patient Account Number: [id number] Date of Birth/Sex: Treating RN: [birth year] (73 y.o. JEANELL [name] Primary Care Adamae Ricklefs: Trinidad Glisson Other Clinician: Referring Seda Kronberg: Treating Deondrick Searls/Extender: Rosan Harlene Trinidad Glisson Devra in Treatment: [phone] Encounter Discharge Information Items Post Procedure  Vitals Discharge Condition: Stable Temperature (F): 97.7 Ambulatory Status: Ambulatory Pulse (bpm): 59 Discharge Destination: Home Respiratory Rate (breaths/min): 18 Transportation: Private Auto Blood Pressure (mmHg): 149/78 Accompanied By: self Schedule Follow-up Appointment: Yes Clinical Summary of Care: Patient Declined Electronic Signature(s) Signed: 10/21/2023 6:29:34 PM By: [name] Pollen RN Entered By: [name] on 10/21/2023 18:29:12 [name] ([id number]) [id number] Page 2 of 7 -------------------------------------------------------------------------------- Lower Extremity Assessment Details Patient Name: Date of Service: [name], [place] [name] 10/21/2023 9:30 A M Medical Record Number: [id number] Patient Account Number: [id number] Date of Birth/Sex: Treating RN: July 24, 1951 (73 y.o. F) Primary Care Ayven Pheasant: Trinidad Glisson Other Clinician: Referring Raquel Sayres: Treating Morene Cecilio/Extender: Rosan Harlene Trinidad Glisson Weeks in Treatment: 165 Electronic Signature(s) Signed: 10/26/2023 5:23:50 PM By: Wyn Iha Entered By: Wyn Iha on 10/21/2023 90:75:75 -------------------------------------------------------------------------------- Multi Wound Chart Details Patient Name: Date of Service: [name], [place] [name] 10/21/2023 9:30 A M Medical Record Number: [id number] Patient Account Number: [id number] Date of Birth/Sex: Treating RN: 05/04/1951 (73 y.o. F) Primary Care Bobbi Yount: Trinidad Glisson Other Clinician: Referring Shyana Kulakowski: Treating Anylah Scheib/Extender: Rosan Harlene Trinidad Glisson Weeks in Treatment: 165 Vital Signs Height(in): 63 Pulse(bpm): 59 Weight(lbs): 176 Blood Pressure(mmHg): 149/78 Body Mass Index(BMI): 31.2 Temperature(F): 97.7 Respiratory Rate(breaths/min): 18 [1:Photos:] [N/A:N/A] Right Breast (mastectomy site) N/A N/A Wound Location: Radiation Burn N/A N/A Wounding Event: Soft Tissue Radionecrosis N/A  N/A Primary Etiology: Anemia, Lymphedema, Deep Vein N/A N/A Comorbid History: Thrombosis, Hypertension, Peripheral Venous Disease, Osteoarthritis, Received Chemotherapy, Received Radiation, Confinement Anxiety 07/26/2020 N/A N/A Date Acquired: 165 N/A N/A Weeks of Treatment: Open N/A N/A Wound Status: No N/A N/A Wound Recurrence: 1.8x3x0.5 N/A N/A Measurements L x W x D (cm) 4.241 N/A N/A A (cm) : rea 2.121 N/A N/A Volume (cm) : -176.80% N/A N/A % Reduction in Area: -1286.30% N/A N/A % Reduction in Volume: Full Thickness Without Exposed N/A N/A Classification: Support Structures Medium N/A N/A Exudate Amount: Serosanguineous N/A N/A Exudate Type: red, brown N/A N/A Exudate Color: [name] ([id number]) [id number] Page 3 of 7 Epibole N/A  N/A Wound Margin: Small (1-33%) N/A N/A Granulation Amount: Pink, Pale N/A N/A Granulation Quality: Large (67-100%) N/A N/A Necrotic Amount: Fat Layer (Subcutaneous Tissue): Yes N/A N/A Exposed Structures: Fascia: No Tendon: No Muscle: No Joint: No Bone: No Small (1-33%) N/A N/A Epithelialization: Debridement - Selective/Open Wound N/A N/A Debridement: Pre-procedure Verification/Time Out 09:59 N/A N/A Taken: Lidocaine 4% Topical Solution N/A N/A Pain Control: Slough N/A N/A Tissue Debrided: Non-Viable Tissue N/A N/A Level: 4.24 N/A N/A Debridement A (sq cm): rea Curette N/A N/A Instrument: Minimum N/A N/A Bleeding: Pressure N/A N/A Hemostasis A chieved: Procedure was tolerated well N/A N/A Debridement Treatment Response: 1.8x3x0.5 N/A N/A Post Debridement Measurements L x W x D (cm) 2.121 N/A N/A Post Debridement Volume: (cm) Excoriation: No N/A N/A Periwound Skin Texture: Induration: No Callus: No Crepitus: No Rash: No Scarring: No Maceration: No N/A N/A Periwound Skin Moisture: Dry/Scaly: No Atrophie Blanche: No N/A N/A Periwound Skin Color: Cyanosis:  No Ecchymosis: No Erythema: No Hemosiderin Staining: No Mottled: No Pallor: No Rubor: No No Abnormality N/A N/A Temperature: Debridement N/A N/A Procedures Performed: Treatment Notes Electronic Signature(s) Signed: 10/21/2023 5:26:35 PM By: Rosan Raisin DO Entered By: Rosan Raisin on 10/21/2023 11:43:01 -------------------------------------------------------------------------------- Multi-Disciplinary Care Plan Details Patient Name: Date of Service: [name], [place] [name] 10/21/2023 9:30 A M Medical Record Number: [id number] Patient Account Number: [id number] Date of Birth/Sex: Treating RN: 1951/02/17 (73 y.o. JEANELL [name] Primary Care Liandro Thelin: Trinidad Glisson Other Clinician: Referring Diamantina Edinger: Treating Barnett Elzey/Extender: Rosan Raisin Trinidad Glisson Devra in Treatment: 165 Multidisciplinary Care Plan reviewed with physician Active Inactive Wound/Skin Impairment Nursing Diagnoses: Impaired tissue integrity Knowledge deficit related to ulceration/compromised skin integrity Goals: Patient/caregiver will verbalize understanding of skin care regimen Archbold, [name] ([id number]) [id number] Page 4 of 7 Date Initiated: 08/22/2020 Target Resolution Date: 12/11/2023 Goal Status: Active Ulcer/skin breakdown will have a volume reduction of 30% by week 4 Date Initiated: 08/22/2020 Date Inactivated: 10/03/2020 Target Resolution Date: 09/19/2020 Goal Status: Met Ulcer/skin breakdown will have a volume reduction of 50% by week 8 Date Initiated: 06/05/2021 Date Inactivated: 07/17/2021 Target Resolution Date: 07/03/2021 Goal Status: Unmet Unmet Reason: infection Interventions: Assess patient/caregiver ability to obtain necessary supplies Assess patient/caregiver ability to perform ulcer/skin care regimen upon admission and as needed Assess ulceration(s) every visit Treatment Activities: Skin care regimen initiated : 08/22/2020 Topical wound management  initiated : 08/22/2020 Notes: 06/05/21: Wound care regimen ongoing. 06/11/22: Wound care regimen continues, applying skin sub ([place]) Electronic Signature(s) Signed: 10/21/2023 6:29:34 PM By: [name] Pollen RN Entered By: [name] on 10/21/2023 18:27:49 -------------------------------------------------------------------------------- Pain Assessment Details Patient Name: Date of Service: [name], [place] [name] 10/21/2023 9:30 A M Medical Record Number: [id number] Patient Account Number: [id number] Date of Birth/Sex: Treating RN: 1950/11/19 (73 y.o. F) Primary Care Noelie Renfrow: Trinidad Glisson Other Clinician: Referring Abimael Zeiter: Treating Aymar Whitfill/Extender: Rosan Raisin Trinidad Glisson Devra in Treatment: [id number] Active Problems Location of Pain Severity and Description of Pain Patient Has Paino No Site Locations Pain Management and Medication Current Pain Management: Electronic Signature(s) Signed: 10/26/2023 5:23:50 PM By: Wyn Iha Entered By: Wyn Iha on 10/21/2023 09:28:01 [name] ([id number]) [id number] Page 5 of 7 -------------------------------------------------------------------------------- Patient/Caregiver Education Details Patient Name: Date of Service: [name], [place] [name] 1/8/2025andnbsp9:30 A M Medical Record Number: [id number] Patient Account Number: [id number] Date of Birth/Gender: Treating RN: 02/20/1951 (73 y.o. JEANELL [name] Primary Care Physician: Trinidad Glisson Other Clinician: Referring Physician: Treating Physician/Extender: Rosan Raisin Trinidad Glisson Devra in Treatment: [phone] Education Assessment Education Provided To: Patient Education Topics Provided Wound/Skin  Impairment: Methods: Explain/Verbal Responses: State content correctly Electronic Signature(s) Signed: 10/21/2023 6:29:34 PM By: [name] Pollen RN Entered By: [name] on 10/21/2023  18:28:02 -------------------------------------------------------------------------------- Wound Assessment Details Patient Name: Date of Service: [name], [place] [name] 10/21/2023 9:30 A M Medical Record Number: [id number] Patient Account Number: [id number] Date of Birth/Sex: Treating RN: September 04, 1951 (73 y.o. F) Primary Care Shron Ozer: Trinidad Glisson Other Clinician: Referring Laetitia Schnepf: Treating Shanvi Moyd/Extender: Rosan Harlene Trinidad Glisson Weeks in Treatment: 165 Wound Status Wound Number: 1 Primary Soft Tissue Radionecrosis Etiology: Wound Location: Right Breast (mastectomy site) Wound Open Wounding Event: Radiation Burn Status: Date Acquired: 07/26/2020 Comorbid Anemia, Lymphedema, Deep Vein Thrombosis, Hypertension, Weeks Of Treatment: 165 History: Peripheral Venous Disease, Osteoarthritis, Received Clustered Wound: No Chemotherapy, Received Radiation, Confinement Anxiety Photos Wound Measurements Length: (cm) 1.8 Width: (cm) 3 [name], [name] ([id number]) Depth: (cm) Area: (cm) Volume: (cm) % Reduction in Area: -176.8% % Reduction in Volume: -1286.3% [id number] Page 6 of 7 0.5 Epithelialization: Small (1-33%) 4.241 Tunneling: No 2.121 Undermining: No Wound Description Classification: Full Thickness Without Exposed Support Structures Wound Margin: Epibole Exudate Amount: Medium Exudate Type: Serosanguineous Exudate Color: red, brown Foul Odor After Cleansing: No Slough/Fibrino Yes Wound Bed Granulation Amount: Small (1-33%) Exposed Structure Granulation Quality: Pink, Pale Fascia Exposed: No Necrotic Amount: Large (67-100%) Fat Layer (Subcutaneous Tissue) Exposed: Yes Necrotic Quality: Adherent Slough Tendon Exposed: No Muscle Exposed: No Joint Exposed: No Bone Exposed: No Periwound Skin Texture Texture Color No Abnormalities Noted: Yes No Abnormalities Noted: Yes Moisture Temperature / Pain No Abnormalities Noted: Yes Temperature:  No Abnormality Treatment Notes Wound #1 (Breast (mastectomy site)) Wound Laterality: Right Cleanser Wound Cleanser Discharge Instruction: Cleanse the wound with wound cleanser prior to applying a clean dressing using gauze sponges, not tissue or cotton balls. Byram Ancillary Kit - 15 Day Supply Discharge Instruction: Use supplies as instructed; Kit contains: (15) Saline Bullets; (15) 3x3 Gauze; 15 pr Gloves Soap and Water Discharge Instruction: May shower and wash wound with dial antibacterial soap and water prior to dressing change. Peri-Wound Care Skin Prep Discharge Instruction: Use skin prep as directed Topical Primary Dressing Hydrofera Blue Ready Transfer Foam, 2.5x2.5 (in/in) Discharge Instruction: Apply directly to wound bed , cut to fit inside wound edges Secondary Dressing Woven Gauze Sponge, Non-Sterile 4x4 in Discharge Instruction: Apply over primary dressing as directed. Woven Gauze Sponges 2x2 in Discharge Instruction: Apply over primary dressing, fold in corners to fill the space Zetuvit Plus Silicone Border Dressing 5x5 (in/in) Discharge Instruction: Apply silicone border over primary dressing as directed. Secured With Compression Wrap Compression Stockings Facilities Manager) Signed: 10/26/2023 5:23:50 PM By: Wyn Iha Entered By: Wyn Iha on 10/21/2023 09:35:48 [name], [name] ([id number]) [id number] Page 7 of 7 -------------------------------------------------------------------------------- Vitals Details Patient Name: Date of Service: [name], [place] [name] 10/21/2023 9:30 A M Medical Record Number: [id number] Patient Account Number: [id number] Date of Birth/Sex: Treating RN: 05/21/1951 (73 y.o. F) Primary Care Rayen Dafoe: Trinidad Glisson Other Clinician: Referring Kinslee Dalpe: Treating Welford Christmas/Extender: Rosan Harlene Trinidad Glisson Devra in Treatment: 165 Vital Signs Time Taken: 09:27 Temperature (F): 97.7 Height  (in): 63 Pulse (bpm): 59 Weight (lbs): 176 Respiratory Rate (breaths/min): 18 Body Mass Index (BMI): 31.2 Blood Pressure (mmHg): 149/78 Reference Range: 80 - 120 mg / dl Electronic Signature(s) Signed: 10/26/2023 5:23:50 PM By: Wyn Iha Entered By: Wyn Iha on 10/21/2023 09:27:54

## 2023-11-03 DIAGNOSIS — L602 Onychogryphosis: Secondary | ICD-10-CM | POA: Diagnosis not present

## 2023-11-16 ENCOUNTER — Other Ambulatory Visit: Payer: Self-pay | Admitting: *Deleted

## 2023-11-16 DIAGNOSIS — C50011 Malignant neoplasm of nipple and areola, right female breast: Secondary | ICD-10-CM

## 2023-11-16 DIAGNOSIS — D508 Other iron deficiency anemias: Secondary | ICD-10-CM

## 2023-11-16 DIAGNOSIS — D631 Anemia in chronic kidney disease: Secondary | ICD-10-CM

## 2023-11-17 ENCOUNTER — Inpatient Hospital Stay: Payer: Medicare Other | Attending: Hematology & Oncology | Admitting: Medical Oncology

## 2023-11-17 ENCOUNTER — Inpatient Hospital Stay: Payer: Medicare Other

## 2023-11-17 ENCOUNTER — Encounter: Payer: Self-pay | Admitting: Medical Oncology

## 2023-11-17 VITALS — BP 139/53 | HR 67 | Temp 98.3°F | Resp 18 | Ht 64.0 in | Wt 191.8 lb

## 2023-11-17 DIAGNOSIS — Z79811 Long term (current) use of aromatase inhibitors: Secondary | ICD-10-CM | POA: Diagnosis not present

## 2023-11-17 DIAGNOSIS — S21109A Unspecified open wound of unspecified front wall of thorax without penetration into thoracic cavity, initial encounter: Secondary | ICD-10-CM | POA: Diagnosis not present

## 2023-11-17 DIAGNOSIS — I739 Peripheral vascular disease, unspecified: Secondary | ICD-10-CM | POA: Diagnosis not present

## 2023-11-17 DIAGNOSIS — L589 Radiodermatitis, unspecified: Secondary | ICD-10-CM

## 2023-11-17 DIAGNOSIS — R609 Edema, unspecified: Secondary | ICD-10-CM | POA: Insufficient documentation

## 2023-11-17 DIAGNOSIS — D631 Anemia in chronic kidney disease: Secondary | ICD-10-CM | POA: Insufficient documentation

## 2023-11-17 DIAGNOSIS — Z7982 Long term (current) use of aspirin: Secondary | ICD-10-CM | POA: Insufficient documentation

## 2023-11-17 DIAGNOSIS — Z9011 Acquired absence of right breast and nipple: Secondary | ICD-10-CM | POA: Insufficient documentation

## 2023-11-17 DIAGNOSIS — Z86718 Personal history of other venous thrombosis and embolism: Secondary | ICD-10-CM | POA: Diagnosis not present

## 2023-11-17 DIAGNOSIS — N189 Chronic kidney disease, unspecified: Secondary | ICD-10-CM | POA: Diagnosis not present

## 2023-11-17 DIAGNOSIS — C50011 Malignant neoplasm of nipple and areola, right female breast: Secondary | ICD-10-CM

## 2023-11-17 DIAGNOSIS — D508 Other iron deficiency anemias: Secondary | ICD-10-CM | POA: Insufficient documentation

## 2023-11-17 DIAGNOSIS — R5383 Other fatigue: Secondary | ICD-10-CM | POA: Diagnosis not present

## 2023-11-17 DIAGNOSIS — C50911 Malignant neoplasm of unspecified site of right female breast: Secondary | ICD-10-CM | POA: Diagnosis not present

## 2023-11-17 DIAGNOSIS — D500 Iron deficiency anemia secondary to blood loss (chronic): Secondary | ICD-10-CM

## 2023-11-17 DIAGNOSIS — T8189XD Other complications of procedures, not elsewhere classified, subsequent encounter: Secondary | ICD-10-CM

## 2023-11-17 LAB — RETICULOCYTES
Immature Retic Fract: 10.6 % (ref 2.3–15.9)
RBC.: 3.94 MIL/uL (ref 3.87–5.11)
Retic Count, Absolute: 47.7 10*3/uL (ref 19.0–186.0)
Retic Ct Pct: 1.2 % (ref 0.4–3.1)

## 2023-11-17 LAB — CMP (CANCER CENTER ONLY)
ALT: 12 U/L (ref 0–44)
AST: 13 U/L — ABNORMAL LOW (ref 15–41)
Albumin: 4.2 g/dL (ref 3.5–5.0)
Alkaline Phosphatase: 54 U/L (ref 38–126)
Anion gap: 8 (ref 5–15)
BUN: 17 mg/dL (ref 8–23)
CO2: 31 mmol/L (ref 22–32)
Calcium: 9.4 mg/dL (ref 8.9–10.3)
Chloride: 107 mmol/L (ref 98–111)
Creatinine: 0.72 mg/dL (ref 0.44–1.00)
GFR, Estimated: >60 mL/min (ref >60)
Glucose, Bld: 97 mg/dL (ref 70–99)
Potassium: 3.7 mmol/L (ref 3.5–5.1)
Sodium: 146 mmol/L — ABNORMAL HIGH (ref 135–145)
Total Bilirubin: 0.6 mg/dL (ref 0.0–1.2)
Total Protein: 7.5 g/dL (ref 6.5–8.1)

## 2023-11-17 LAB — CBC WITH DIFFERENTIAL (CANCER CENTER ONLY)
Abs Immature Granulocytes: 0.03 10*3/uL (ref 0.00–0.07)
Basophils Absolute: 0 10*3/uL (ref 0.0–0.1)
Basophils Relative: 1 %
Eosinophils Absolute: 0.2 10*3/uL (ref 0.0–0.5)
Eosinophils Relative: 3 %
HCT: 36.2 % (ref 36.0–46.0)
Hemoglobin: 11.5 g/dL — ABNORMAL LOW (ref 12.0–15.0)
Immature Granulocytes: 1 %
Lymphocytes Relative: 33 %
Lymphs Abs: 2.1 10*3/uL (ref 0.7–4.0)
MCH: 29.4 pg (ref 26.0–34.0)
MCHC: 31.8 g/dL (ref 30.0–36.0)
MCV: 92.6 fL (ref 80.0–100.0)
Monocytes Absolute: 0.5 10*3/uL (ref 0.1–1.0)
Monocytes Relative: 8 %
Neutro Abs: 3.4 10*3/uL (ref 1.7–7.7)
Neutrophils Relative %: 54 %
Platelet Count: 183 10*3/uL (ref 150–400)
RBC: 3.91 MIL/uL (ref 3.87–5.11)
RDW: 14.5 % (ref 11.5–15.5)
WBC Count: 6.2 10*3/uL (ref 4.0–10.5)
nRBC: 0 % (ref 0.0–0.2)

## 2023-11-17 LAB — IRON AND IRON BINDING CAPACITY (CC-WL,HP ONLY)
Iron: 79 ug/dL (ref 28–170)
Saturation Ratios: 32 % — ABNORMAL HIGH (ref 10.4–31.8)
TIBC: 248 ug/dL — ABNORMAL LOW (ref 250–450)
UIBC: 169 ug/dL (ref 148–442)

## 2023-11-17 LAB — FERRITIN: Ferritin: 590 ng/mL — ABNORMAL HIGH (ref 11–307)

## 2023-11-17 NOTE — Progress Notes (Signed)
 Hematology and Oncology Follow Up Visit  [name] [id number] 03/14/1951 73 y.o. 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 daily - restarted on 09/23/2022 Aspirin 81 mg p.o. daily IV iron as needed Aranesp  300 mcg sq for Hgb less than 11    Interim History:  [name] is here today for follow-up.   At her last visit a pelvic US  was recommended given some chronic pelvic pain. She had this performed on 09/03/2023. The ultrasound showed no masses of concern.   She is followed by wound care for a non-healing wound of her chest from radiation treatment years ago that was complicated by an infection years ago. Not worsening.   She reports that she has been doing well since her last visit. Some occasional fatigue.  No breast concerns or changes noted by patient.  No mass, lesion or rash noted.  No adenopathy noted on exam.  She has chronic edema in her lower extremities she states due to PVD. No falls or syncope reported.  Appetite and hydration are good.  No fever, chills, n/v, cough, rash, dizziness, SOB, chest pain, palpitations,  or changes in bowel or bladder habits.  No blood loss noted. No bruising or petechiae.  Wt Readings from Last 3 Encounters:  11/17/23 191 lb 12.8 oz (87 kg)  08/24/23 195 lb (88.5 kg)  03/10/23 192 lb 12.8 oz (87.5 kg)   ECOG Performance Status: 1 - Symptomatic but completely ambulatory  Medications:  Allergies as of 11/17/2023       Reactions   Contrast Media [iodinated Contrast Media] Hives, Shortness Of Breath, Nausea And Vomiting   Allergic reaction even after pre-meds.   Metrizamide Hives, Shortness Of Breath, Nausea And Vomiting   Allergic reaction even after pre-meds.   Other Shortness Of Breath, Nausea And Vomiting, Rash   Allergic  reaction even after pre-meds.   Dye Fdc Blue [brilliant Blue Fcf (fd&c Blue #1)] Hives, Nausea Only   Fd&c Blue #1 (brilliant Blue) Hives, Nausea Only        Medication List        Accurate as of November 17, 2023 10:20 AM. If you have any questions, ask your nurse or doctor.          amLODipine 10 MG tablet Commonly known as: NORVASC TAKE 1 TABLET BY MOUTH DAILY   amoxicillin 500 MG capsule Commonly known as: AMOXIL Before dental procedures   aspirin EC 81 MG tablet Take 81 mg by mouth daily.   celecoxib 200 MG capsule Commonly known as: CELEBREX Take 200 mg by mouth daily as needed.   exemestane  25 MG tablet Commonly known as: AROMASIN  TAKE 1 TABLET BY MOUTH EVERY DAY   hydrochlorothiazide 25 MG tablet Commonly known as: HYDRODIURIL Take 25 mg by mouth daily.   IBUPROFEN PO Take 400 mg by mouth 2 (two) times daily as needed.   metoprolol tartrate 100 MG tablet Commonly known as: LOPRESSOR Take 100 mg by mouth daily.   simvastatin 20 MG tablet Commonly known as: ZOCOR Take 20 mg by mouth daily.   valsartan 160 MG tablet Commonly known as: DIOVAN Take 160 mg by mouth daily.   Vitamin D  (Ergocalciferol ) 1.25 MG (50000 UNIT) Caps capsule Commonly known as: DRISDOL  TAKE 1 CAPSULE BY MOUTH ONE TIME PER WEEK  Allergies:  Allergies  Allergen Reactions   Contrast Media [Iodinated Contrast Media] Hives, Shortness Of Breath and Nausea And Vomiting    Allergic reaction even after pre-meds.   Metrizamide Hives, Shortness Of Breath and Nausea And Vomiting    Allergic reaction even after pre-meds.   Other Shortness Of Breath, Nausea And Vomiting and Rash    Allergic reaction even after pre-meds.   Dye Fdc Blue [Brilliant Blue Fcf (Fd&C Blue #1)] Hives and Nausea Only   Fd&C Blue #1 (Brilliant Blue) Hives and Nausea Only    Past Medical History, Surgical history, Social history, and Family History were reviewed and updated.  Review of Systems: All  other 10 point review of systems is negative.   Physical Exam:  height is 5' 4 (1.626 m) and weight is 191 lb 12.8 oz (87 kg). Her oral temperature is 98.3 F (36.8 C). Her blood pressure is 139/53 (abnormal) and her pulse is 67. Her respiration is 18 and oxygen saturation is 100%.   Wt Readings from Last 3 Encounters:  11/17/23 191 lb 12.8 oz (87 kg)  08/24/23 195 lb (88.5 kg)  03/10/23 192 lb 12.8 oz (87.5 kg)    Ocular: Sclerae unicteric, pupils equal, round and reactive to light Ear-nose-throat: Oropharynx clear, dentition fair Lymphatic: No cervical, supraclavicular or axillary adenopathy Lungs no rales or rhonchi, good excursion bilaterally Heart regular rate and rhythm, no murmur appreciated Abd soft, nontender, positive bowel sounds MSK no focal spinal tenderness, no joint edema Neuro: non-focal, well-oriented, appropriate affect Breasts: The right breast has hyperpigmentation and hyperkeratosis secondary to radiation. There is a quarter sized area with blue wound repair foam. No discharge or bleeding.   Lab Results  Component Value Date   WBC 6.2 11/17/2023   HGB 11.5 (L) 11/17/2023   HCT 36.2 11/17/2023   MCV 92.6 11/17/2023   PLT 183 11/17/2023   Lab Results  Component Value Date   FERRITIN 563 (H) 08/24/2023   IRON 72 08/24/2023   TIBC 260 08/24/2023   UIBC 188 08/24/2023   IRONPCTSAT 28 08/24/2023   Lab Results  Component Value Date   RETICCTPCT 1.2 11/17/2023   RBC 3.94 11/17/2023   RETICCTABS 55.2 06/20/2011   No results found for: KPAFRELGTCHN, LAMBDASER, KAPLAMBRATIO No results found for: KIMBERLY LE [facility] Lab Results  Component Value Date   TOTALPROTELP 6.6 08/14/2010   ALBUMINELP 53.8 (L) 08/14/2010   A1GS 5.3 (H) 08/14/2010   A2GS 14.3 (H) 08/14/2010   BETS 5.8 08/14/2010   BETA2SER 6.3 08/14/2010   GAMS 14.5 08/14/2010   MSPIKE NOT DET 08/14/2010   SPEI * 08/14/2010     Chemistry      Component Value Date/Time   NA  146 (H) 11/17/2023 0926   NA 146 (H) 07/23/2017 0900   NA 144 02/18/2017 0901   K 3.7 11/17/2023 0926   K 3.7 07/23/2017 0900   K 3.7 02/18/2017 0901   CL 107 11/17/2023 0926   CL 107 07/23/2017 0900   CO2 31 11/17/2023 0926   CO2 31 07/23/2017 0900   CO2 27 02/18/2017 0901   BUN 17 11/17/2023 0926   BUN 16 07/23/2017 0900   BUN 17.7 02/18/2017 0901   CREATININE 0.72 11/17/2023 0926   CREATININE 0.8 07/23/2017 0900   CREATININE 0.7 02/18/2017 0901      Component Value Date/Time   CALCIUM 9.4 11/17/2023 0926   CALCIUM 9.3 07/23/2017 0900   CALCIUM 9.3 02/18/2017 0901   ALKPHOS 54 11/17/2023 0926  ALKPHOS 67 07/23/2017 0900   ALKPHOS 78 02/18/2017 0901   AST 13 (L) 11/17/2023 0926   AST 13 02/18/2017 0901   ALT 12 11/17/2023 0926   ALT 20 07/23/2017 0900   ALT 12 02/18/2017 0901   BILITOT 0.6 11/17/2023 0926   BILITOT 0.54 02/18/2017 0901      Encounter Diagnoses  Name Primary?   Iron deficiency anemia secondary to inadequate dietary iron intake Yes   Erythropoietin  deficiency anemia    Malignant neoplasm of nipple of right breast in female, unspecified estrogen receptor status (HCC)    Iron deficiency anemia due to chronic blood loss    Impression and Plan: Ms. Hedgepeth is a very pleasant 73 yo caucasian female with history of locally recurrent adenocarcinoma of the right breast with resection and radiation. Also seen and followed for IDA and erythropoietin  deficiency.   She will continue with wound care.Given length of time that she has had the wound and history of breast cancer I have discussed imaging with Dr. Timmy. We have elected to obtain a PET scan- she is s/p mastectomy of the right side so a CT/MR of chest wall may not be very effective.   Last mammogram of left breast was on 09/24/2023- BI-RADS 2  Iron studies are pending.  CA 27.29 pending   ESA held today, Hgb 11.5 Continue same regimen with Aromasin . She is tolerating nicely.    Disposition PET scan  order placed Lab, ESA in 6 weeks RTC 3 months MD, labs, ESA  Lauraine CHRISTELLA Dais, PA-C 2/4/202510:20 AM

## 2023-11-18 ENCOUNTER — Encounter: Payer: Self-pay | Admitting: Family

## 2023-11-18 ENCOUNTER — Encounter (HOSPITAL_BASED_OUTPATIENT_CLINIC_OR_DEPARTMENT_OTHER): Payer: Medicare Other | Attending: Internal Medicine | Admitting: Internal Medicine

## 2023-11-18 DIAGNOSIS — L598 Other specified disorders of the skin and subcutaneous tissue related to radiation: Secondary | ICD-10-CM | POA: Diagnosis not present

## 2023-11-18 DIAGNOSIS — L98492 Non-pressure chronic ulcer of skin of other sites with fat layer exposed: Secondary | ICD-10-CM | POA: Insufficient documentation

## 2023-11-18 DIAGNOSIS — S21001A Unspecified open wound of right breast, initial encounter: Secondary | ICD-10-CM | POA: Insufficient documentation

## 2023-11-18 DIAGNOSIS — I10 Essential (primary) hypertension: Secondary | ICD-10-CM | POA: Diagnosis not present

## 2023-11-18 DIAGNOSIS — L589 Radiodermatitis, unspecified: Secondary | ICD-10-CM | POA: Diagnosis not present

## 2023-11-18 DIAGNOSIS — X58XXXA Exposure to other specified factors, initial encounter: Secondary | ICD-10-CM | POA: Diagnosis not present

## 2023-12-04 ENCOUNTER — Encounter (HOSPITAL_COMMUNITY): Payer: Medicare Other

## 2023-12-10 ENCOUNTER — Other Ambulatory Visit: Payer: Self-pay

## 2023-12-10 DIAGNOSIS — C50011 Malignant neoplasm of nipple and areola, right female breast: Secondary | ICD-10-CM

## 2023-12-10 MED ORDER — EXEMESTANE 25 MG PO TABS: 25.0000 mg | ORAL_TABLET | Freq: Every day | ORAL | 4 refills | Status: AC

## 2023-12-11 ENCOUNTER — Encounter (HOSPITAL_COMMUNITY)
Admission: RE | Admit: 2023-12-11 | Discharge: 2023-12-11 | Disposition: A | Discharge status: Home / Self Care | Payer: Medicare Other | Source: Ambulatory Visit | Attending: Medical Oncology | Admitting: Medical Oncology

## 2023-12-11 DIAGNOSIS — C50011 Malignant neoplasm of nipple and areola, right female breast: Secondary | ICD-10-CM | POA: Diagnosis not present

## 2023-12-11 DIAGNOSIS — R911 Solitary pulmonary nodule: Secondary | ICD-10-CM | POA: Diagnosis not present

## 2023-12-11 DIAGNOSIS — T8189XD Other complications of procedures, not elsewhere classified, subsequent encounter: Secondary | ICD-10-CM

## 2023-12-11 LAB — GLUCOSE, CAPILLARY: Glucose-Capillary: 101 mg/dL — ABNORMAL HIGH (ref 70–99)

## 2023-12-11 MED ORDER — FLUDEOXYGLUCOSE F - 18 (FDG) INJECTION
9.5000 | Freq: Once | INTRAVENOUS | Status: AC
  Administered 2023-12-11: 9.5 via INTRAVENOUS

## 2023-12-16 ENCOUNTER — Encounter (HOSPITAL_BASED_OUTPATIENT_CLINIC_OR_DEPARTMENT_OTHER): Payer: Medicare Other | Attending: Internal Medicine | Admitting: Internal Medicine

## 2023-12-16 DIAGNOSIS — L589 Radiodermatitis, unspecified: Secondary | ICD-10-CM | POA: Insufficient documentation

## 2023-12-16 DIAGNOSIS — I10 Essential (primary) hypertension: Secondary | ICD-10-CM | POA: Insufficient documentation

## 2023-12-16 DIAGNOSIS — S21001A Unspecified open wound of right breast, initial encounter: Secondary | ICD-10-CM | POA: Insufficient documentation

## 2023-12-16 DIAGNOSIS — L598 Other specified disorders of the skin and subcutaneous tissue related to radiation: Secondary | ICD-10-CM | POA: Insufficient documentation

## 2023-12-16 DIAGNOSIS — L98492 Non-pressure chronic ulcer of skin of other sites with fat layer exposed: Secondary | ICD-10-CM | POA: Insufficient documentation

## 2023-12-23 ENCOUNTER — Other Ambulatory Visit: Payer: Self-pay | Admitting: Hematology & Oncology

## 2023-12-29 ENCOUNTER — Inpatient Hospital Stay: Payer: Medicare Other

## 2023-12-31 ENCOUNTER — Telehealth: Payer: Self-pay

## 2023-12-31 NOTE — Telephone Encounter (Signed)
 Per Dr. Myna Hidalgo pt notified of the following result: "the PET scan looks fine without any obvious breast cancer recurrence" Pt appreciative of call and had no further concerns.

## 2023-12-31 NOTE — Telephone Encounter (Signed)
-----   Message from Josph Macho sent at 12/31/2023  3:17 PM EDT ----- Please call and let her know that the PET scan looks fine without any obvious breast cancer recurrence.  Thanks.  Cindee Lame

## 2024-01-13 ENCOUNTER — Encounter (HOSPITAL_BASED_OUTPATIENT_CLINIC_OR_DEPARTMENT_OTHER): Attending: Internal Medicine | Admitting: Internal Medicine

## 2024-01-13 DIAGNOSIS — L598 Other specified disorders of the skin and subcutaneous tissue related to radiation: Secondary | ICD-10-CM | POA: Diagnosis not present

## 2024-01-13 DIAGNOSIS — L98492 Non-pressure chronic ulcer of skin of other sites with fat layer exposed: Secondary | ICD-10-CM

## 2024-01-13 DIAGNOSIS — S21001A Unspecified open wound of right breast, initial encounter: Secondary | ICD-10-CM

## 2024-01-13 DIAGNOSIS — L589 Radiodermatitis, unspecified: Secondary | ICD-10-CM

## 2024-02-04 DIAGNOSIS — L602 Onychogryphosis: Secondary | ICD-10-CM | POA: Diagnosis not present

## 2024-02-08 ENCOUNTER — Other Ambulatory Visit: Payer: Self-pay

## 2024-02-08 DIAGNOSIS — D508 Other iron deficiency anemias: Secondary | ICD-10-CM

## 2024-02-08 DIAGNOSIS — C50011 Malignant neoplasm of nipple and areola, right female breast: Secondary | ICD-10-CM

## 2024-02-08 DIAGNOSIS — D631 Anemia in chronic kidney disease: Secondary | ICD-10-CM

## 2024-02-09 ENCOUNTER — Encounter: Payer: Self-pay | Admitting: Medical Oncology

## 2024-02-09 ENCOUNTER — Inpatient Hospital Stay: Payer: Medicare Other

## 2024-02-09 ENCOUNTER — Inpatient Hospital Stay: Payer: Medicare Other | Attending: Hematology & Oncology | Admitting: Medical Oncology

## 2024-02-09 VITALS — BP 145/51 | HR 72 | Temp 98.7°F | Resp 18 | Ht 64.0 in | Wt 187.0 lb

## 2024-02-09 DIAGNOSIS — D631 Anemia in chronic kidney disease: Secondary | ICD-10-CM

## 2024-02-09 DIAGNOSIS — I739 Peripheral vascular disease, unspecified: Secondary | ICD-10-CM | POA: Diagnosis not present

## 2024-02-09 DIAGNOSIS — Z9011 Acquired absence of right breast and nipple: Secondary | ICD-10-CM | POA: Insufficient documentation

## 2024-02-09 DIAGNOSIS — Z79811 Long term (current) use of aromatase inhibitors: Secondary | ICD-10-CM | POA: Insufficient documentation

## 2024-02-09 DIAGNOSIS — R102 Pelvic and perineal pain: Secondary | ICD-10-CM | POA: Insufficient documentation

## 2024-02-09 DIAGNOSIS — C50011 Malignant neoplasm of nipple and areola, right female breast: Secondary | ICD-10-CM

## 2024-02-09 DIAGNOSIS — C50911 Malignant neoplasm of unspecified site of right female breast: Secondary | ICD-10-CM | POA: Diagnosis not present

## 2024-02-09 DIAGNOSIS — S21109D Unspecified open wound of unspecified front wall of thorax without penetration into thoracic cavity, subsequent encounter: Secondary | ICD-10-CM | POA: Insufficient documentation

## 2024-02-09 DIAGNOSIS — Z86718 Personal history of other venous thrombosis and embolism: Secondary | ICD-10-CM | POA: Insufficient documentation

## 2024-02-09 DIAGNOSIS — R609 Edema, unspecified: Secondary | ICD-10-CM | POA: Insufficient documentation

## 2024-02-09 DIAGNOSIS — R5383 Other fatigue: Secondary | ICD-10-CM | POA: Diagnosis not present

## 2024-02-09 DIAGNOSIS — Z7982 Long term (current) use of aspirin: Secondary | ICD-10-CM | POA: Diagnosis not present

## 2024-02-09 DIAGNOSIS — D508 Other iron deficiency anemias: Secondary | ICD-10-CM

## 2024-02-09 LAB — CBC WITH DIFFERENTIAL (CANCER CENTER ONLY)
Abs Immature Granulocytes: 0.01 10*3/uL (ref 0.00–0.07)
Basophils Absolute: 0 10*3/uL (ref 0.0–0.1)
Basophils Relative: 1 %
Eosinophils Absolute: 0.2 10*3/uL (ref 0.0–0.5)
Eosinophils Relative: 4 %
HCT: 35.4 % — ABNORMAL LOW (ref 36.0–46.0)
Hemoglobin: 11.1 g/dL — ABNORMAL LOW (ref 12.0–15.0)
Immature Granulocytes: 0 %
Lymphocytes Relative: 30 %
Lymphs Abs: 1.8 10*3/uL (ref 0.7–4.0)
MCH: 29.1 pg (ref 26.0–34.0)
MCHC: 31.4 g/dL (ref 30.0–36.0)
MCV: 92.9 fL (ref 80.0–100.0)
Monocytes Absolute: 0.5 10*3/uL (ref 0.1–1.0)
Monocytes Relative: 7 %
Neutro Abs: 3.6 10*3/uL (ref 1.7–7.7)
Neutrophils Relative %: 58 %
Platelet Count: 204 10*3/uL (ref 150–400)
RBC: 3.81 MIL/uL — ABNORMAL LOW (ref 3.87–5.11)
RDW: 13.3 % (ref 11.5–15.5)
WBC Count: 6.1 10*3/uL (ref 4.0–10.5)
nRBC: 0 % (ref 0.0–0.2)

## 2024-02-09 LAB — CMP (CANCER CENTER ONLY)
ALT: 12 U/L (ref 0–44)
AST: 13 U/L — ABNORMAL LOW (ref 15–41)
Albumin: 3.2 g/dL — ABNORMAL LOW (ref 3.5–5.0)
Alkaline Phosphatase: 41 U/L (ref 38–126)
Anion gap: 11 (ref 5–15)
BUN: 12 mg/dL (ref 8–23)
CO2: 28 mmol/L (ref 22–32)
Calcium: 9 mg/dL (ref 8.9–10.3)
Chloride: 106 mmol/L (ref 98–111)
Creatinine: 0.73 mg/dL (ref 0.44–1.00)
GFR, Estimated: >60 mL/min (ref >60)
Glucose, Bld: 76 mg/dL (ref 70–99)
Potassium: 3.9 mmol/L (ref 3.5–5.1)
Sodium: 145 mmol/L (ref 135–145)
Total Bilirubin: 0.6 mg/dL (ref 0.0–1.2)
Total Protein: 7.7 g/dL (ref 6.5–8.1)

## 2024-02-09 LAB — IRON AND IRON BINDING CAPACITY (CC-WL,HP ONLY)
Iron: 55 ug/dL (ref 28–170)
Saturation Ratios: 21 % (ref 10.4–31.8)
TIBC: 258 ug/dL (ref 250–450)
UIBC: 203 ug/dL (ref 148–442)

## 2024-02-09 LAB — RETICULOCYTES
Immature Retic Fract: 13 % (ref 2.3–15.9)
RBC.: 3.9 MIL/uL (ref 3.87–5.11)
Retic Count, Absolute: 52.7 10*3/uL (ref 19.0–186.0)
Retic Ct Pct: 1.4 % (ref 0.4–3.1)

## 2024-02-09 LAB — FERRITIN: Ferritin: 779 ng/mL — ABNORMAL HIGH (ref 11–307)

## 2024-02-09 NOTE — Progress Notes (Signed)
 Hematology and Oncology Follow Up Visit  [name] [id number] [birth year] 73 y.o. 02/09/2024   Principle Diagnosis:  Locally recurrent adenocarcinoma of the right breast History of superficial venous thrombus of the right thigh Iron deficiency anemia Erythropoietin  deficient anemia   Current Therapy:        Aromasin  25 mg p.o. daily - restarted on 09/23/2022 Aspirin 81 mg p.o. daily IV iron as needed Aranesp  300 mcg sq for Hgb less than 11    Interim History:  [name] is here today for follow-up.   Today she states that she has been well. She has chronic fatigue but otherwise is well.   At her last visit a pelvic US  was recommended given some chronic pelvic pain. She had this performed on 09/03/2023. The ultrasound showed no masses of concern.   She is followed by wound care for a non-healing wound of her chest from radiation treatment years ago that was complicated by an infection years ago. Not worsening. She is about to start a wound vac for this area which she has mixed feelings about.   No breast concerns or changes noted by patient.  No mass, lesion or rash noted.  No adenopathy noted on exam.  She has chronic edema in her lower extremities she states due to PVD. No falls or syncope reported.  Appetite and hydration are good.  No fever, chills, n/v, cough, rash, dizziness, SOB, chest pain, palpitations,  or changes in bowel or bladder habits.  No blood loss noted. No bruising or petechiae.  Wt Readings from Last 3 Encounters:  02/09/24 187 lb 0.6 oz (84.8 kg)  11/17/23 191 lb 12.8 oz (87 kg)  08/24/23 195 lb (88.5 kg)   ECOG Performance Status: 1 - Symptomatic but completely ambulatory  Medications:  Allergies as of 02/09/2024       Reactions   Contrast Media [iodinated Contrast Media] Hives, Shortness Of Breath, Nausea And Vomiting   Allergic reaction even after pre-meds.   Metrizamide Hives, Shortness Of Breath, Nausea And Vomiting   Allergic reaction even  after pre-meds.   Other Shortness Of Breath, Nausea And Vomiting, Rash   Allergic reaction even after pre-meds.   Dye Fdc Blue [brilliant Blue Fcf (fd&c Blue #1)] Hives, Nausea Only   Fd&c Blue #1 (brilliant Blue) Hives, Nausea Only        Medication List        Accurate as of February 09, 2024  3:20 PM. If you have any questions, ask your nurse or doctor.          amLODipine 10 MG tablet Commonly known as: NORVASC TAKE 1 TABLET BY MOUTH DAILY   amoxicillin 500 MG capsule Commonly known as: AMOXIL Before dental procedures   aspirin EC 81 MG tablet Take 81 mg by mouth daily.   celecoxib 200 MG capsule Commonly known as: CELEBREX Take 200 mg by mouth daily as needed.   exemestane  25 MG tablet Commonly known as: AROMASIN  Take 1 tablet (25 mg total) by mouth daily.   hydrochlorothiazide 25 MG tablet Commonly known as: HYDRODIURIL Take 25 mg by mouth daily.   IBUPROFEN PO Take 400 mg by mouth 2 (two) times daily as needed.   metoprolol tartrate 100 MG tablet Commonly known as: LOPRESSOR Take 100 mg by mouth daily.   simvastatin 20 MG tablet Commonly known as: ZOCOR Take 20 mg by mouth daily.   valsartan 160 MG tablet Commonly known as: DIOVAN Take 160 mg by mouth daily.  Vitamin D  (Ergocalciferol ) 1.25 MG (50000 UNIT) Caps capsule Commonly known as: DRISDOL  TAKE 1 CAPSULE BY MOUTH ONE TIME PER WEEK        Allergies:  Allergies  Allergen Reactions   Contrast Media [Iodinated Contrast Media] Hives, Shortness Of Breath and Nausea And Vomiting    Allergic reaction even after pre-meds.   Metrizamide Hives, Shortness Of Breath and Nausea And Vomiting    Allergic reaction even after pre-meds.   Other Shortness Of Breath, Nausea And Vomiting and Rash    Allergic reaction even after pre-meds.   Dye Fdc Blue [Brilliant Blue Fcf (Fd&C Blue #1)] Hives and Nausea Only   Fd&C Blue #1 (Brilliant Blue) Hives and Nausea Only    Past Medical History, Surgical  history, Social history, and Family History were reviewed and updated.  Review of Systems: All other 10 point review of systems is negative.   Physical Exam:  height is 5\' 4"  (1.626 m) and weight is 187 lb 0.6 oz (84.8 kg). Her oral temperature is 98.7 F (37.1 C). Her blood pressure is 145/51 (abnormal) and her pulse is 72. Her respiration is 18 and oxygen saturation is 100%.   Wt Readings from Last 3 Encounters:  02/09/24 187 lb 0.6 oz (84.8 kg)  11/17/23 191 lb 12.8 oz (87 kg)  08/24/23 195 lb (88.5 kg)    Ocular: Sclerae unicteric, pupils equal, round and reactive to light Ear-nose-throat: Oropharynx clear, dentition fair Lymphatic: No cervical, supraclavicular or axillary adenopathy Lungs no rales or rhonchi, good excursion bilaterally Heart regular rate and rhythm, no murmur appreciated Abd soft, nontender, positive bowel sounds MSK no focal spinal tenderness, no joint edema Neuro: non-focal, well-oriented, appropriate affect Lab Results  Component Value Date   WBC 6.1 02/09/2024   HGB 11.1 (L) 02/09/2024   HCT 35.4 (L) 02/09/2024   MCV 92.9 02/09/2024   PLT 204 02/09/2024   Lab Results  Component Value Date   FERRITIN 590 (H) 11/17/2023   IRON 79 11/17/2023   TIBC 248 (L) 11/17/2023   UIBC 169 11/17/2023   IRONPCTSAT 32 (H) 11/17/2023   Lab Results  Component Value Date   RETICCTPCT 1.4 02/09/2024   RBC 3.90 02/09/2024   RETICCTABS 55.2 06/20/2011   No results found for: "KPAFRELGTCHN", "LAMBDASER", "KAPLAMBRATIO" No results found for: "IGGSERUM", "IGA", "IGMSERUM" Lab Results  Component Value Date   TOTALPROTELP 6.6 08/14/2010   ALBUMINELP 53.8 (L) 08/14/2010   A1GS 5.3 (H) 08/14/2010   A2GS 14.3 (H) 08/14/2010   BETS 5.8 08/14/2010   BETA2SER 6.3 08/14/2010   GAMS 14.5 08/14/2010   MSPIKE NOT DET 08/14/2010   SPEI * 08/14/2010     Chemistry      Component Value Date/Time   NA 145 02/09/2024 1021   NA 146 (H) 07/23/2017 0900   NA 144 02/18/2017  0901   K 3.9 02/09/2024 1021   K 3.7 07/23/2017 0900   K 3.7 02/18/2017 0901   CL 106 02/09/2024 1021   CL 107 07/23/2017 0900   CO2 28 02/09/2024 1021   CO2 31 07/23/2017 0900   CO2 27 02/18/2017 0901   BUN 12 02/09/2024 1021   BUN 16 07/23/2017 0900   BUN 17.7 02/18/2017 0901   CREATININE 0.73 02/09/2024 1021   CREATININE 0.8 07/23/2017 0900   CREATININE 0.7 02/18/2017 0901      Component Value Date/Time   CALCIUM 9.0 02/09/2024 1021   CALCIUM 9.3 07/23/2017 0900   CALCIUM 9.3 02/18/2017 0901   ALKPHOS  41 02/09/2024 1021   ALKPHOS 67 07/23/2017 0900   ALKPHOS 78 02/18/2017 0901   AST 13 (L) 02/09/2024 1021   AST 13 02/18/2017 0901   ALT 12 02/09/2024 1021   ALT 20 07/23/2017 0900   ALT 12 02/18/2017 0901   BILITOT 0.6 02/09/2024 1021   BILITOT 0.54 02/18/2017 0901      Encounter Diagnoses  Name Primary?   Iron deficiency anemia secondary to inadequate dietary iron intake Yes   Erythropoietin  deficiency anemia    Malignant neoplasm of nipple of right breast in female, unspecified estrogen receptor status (HCC)     Impression and Plan: Ms. Prange is a very pleasant 73 yo caucasian female with history of locally recurrent adenocarcinoma of the right breast with resection and radiation. Also seen and followed for IDA and erythropoietin  deficiency.   PET scan on 12/11/2023 non-concerning for recurrence of disease  Last mammogram of left breast was on 09/24/2023- BI-RADS 2  Iron studies are pending. Will replace if needed ESA held today, Hgb 11.1 Continue same regimen with Aromasin . She is tolerating nicely.   Continue with wound care follow up  Disposition RTC 6 weeks APP, labs (CBC w/, CMP, LDH, CA 27.29, iron, ferritin, retic), ESA  Sharla Davis, PA-C 4/29/20253:20 PM

## 2024-02-10 ENCOUNTER — Telehealth: Payer: Self-pay

## 2024-02-10 NOTE — Telephone Encounter (Signed)
Called and informed patient of lab results, patient verbalized understanding and denies any questions or concerns at this time.   

## 2024-02-10 NOTE — Telephone Encounter (Signed)
-----   Message from Ivor Mars sent at 02/09/2024  9:12 PM EDT ----- Call - the iron level is ok!!  [name]

## 2024-02-17 ENCOUNTER — Encounter (HOSPITAL_BASED_OUTPATIENT_CLINIC_OR_DEPARTMENT_OTHER): Attending: Internal Medicine | Admitting: Internal Medicine

## 2024-02-17 DIAGNOSIS — L598 Other specified disorders of the skin and subcutaneous tissue related to radiation: Secondary | ICD-10-CM | POA: Diagnosis not present

## 2024-02-17 DIAGNOSIS — Z9011 Acquired absence of right breast and nipple: Secondary | ICD-10-CM | POA: Diagnosis not present

## 2024-02-17 DIAGNOSIS — I10 Essential (primary) hypertension: Secondary | ICD-10-CM | POA: Diagnosis not present

## 2024-02-17 DIAGNOSIS — L98492 Non-pressure chronic ulcer of skin of other sites with fat layer exposed: Secondary | ICD-10-CM

## 2024-02-17 DIAGNOSIS — S21001A Unspecified open wound of right breast, initial encounter: Secondary | ICD-10-CM | POA: Diagnosis not present

## 2024-02-17 DIAGNOSIS — L589 Radiodermatitis, unspecified: Secondary | ICD-10-CM | POA: Insufficient documentation

## 2024-02-17 DIAGNOSIS — Z86718 Personal history of other venous thrombosis and embolism: Secondary | ICD-10-CM | POA: Diagnosis not present

## 2024-02-17 DIAGNOSIS — Z9221 Personal history of antineoplastic chemotherapy: Secondary | ICD-10-CM | POA: Diagnosis not present

## 2024-02-18 ENCOUNTER — Other Ambulatory Visit: Payer: Self-pay | Admitting: Hematology & Oncology

## 2024-03-21 ENCOUNTER — Encounter (HOSPITAL_BASED_OUTPATIENT_CLINIC_OR_DEPARTMENT_OTHER): Attending: Internal Medicine | Admitting: Internal Medicine

## 2024-03-21 DIAGNOSIS — X58XXXA Exposure to other specified factors, initial encounter: Secondary | ICD-10-CM | POA: Insufficient documentation

## 2024-03-21 DIAGNOSIS — L589 Radiodermatitis, unspecified: Secondary | ICD-10-CM | POA: Insufficient documentation

## 2024-03-21 DIAGNOSIS — I10 Essential (primary) hypertension: Secondary | ICD-10-CM | POA: Insufficient documentation

## 2024-03-21 DIAGNOSIS — S21001A Unspecified open wound of right breast, initial encounter: Secondary | ICD-10-CM | POA: Diagnosis not present

## 2024-03-21 DIAGNOSIS — L598 Other specified disorders of the skin and subcutaneous tissue related to radiation: Secondary | ICD-10-CM | POA: Diagnosis not present

## 2024-03-21 DIAGNOSIS — L98492 Non-pressure chronic ulcer of skin of other sites with fat layer exposed: Secondary | ICD-10-CM | POA: Insufficient documentation

## 2024-03-22 ENCOUNTER — Inpatient Hospital Stay

## 2024-03-22 ENCOUNTER — Inpatient Hospital Stay (HOSPITAL_BASED_OUTPATIENT_CLINIC_OR_DEPARTMENT_OTHER): Admitting: Medical Oncology

## 2024-03-22 ENCOUNTER — Ambulatory Visit: Payer: Self-pay | Admitting: Medical Oncology

## 2024-03-22 ENCOUNTER — Inpatient Hospital Stay: Attending: Hematology & Oncology

## 2024-03-22 ENCOUNTER — Encounter: Payer: Self-pay | Admitting: Medical Oncology

## 2024-03-22 VITALS — BP 149/55 | HR 90 | Temp 98.3°F | Resp 18 | Ht 64.0 in | Wt 186.0 lb

## 2024-03-22 DIAGNOSIS — S21109D Unspecified open wound of unspecified front wall of thorax without penetration into thoracic cavity, subsequent encounter: Secondary | ICD-10-CM | POA: Diagnosis not present

## 2024-03-22 DIAGNOSIS — R5383 Other fatigue: Secondary | ICD-10-CM | POA: Diagnosis not present

## 2024-03-22 DIAGNOSIS — Z9011 Acquired absence of right breast and nipple: Secondary | ICD-10-CM | POA: Diagnosis not present

## 2024-03-22 DIAGNOSIS — D631 Anemia in chronic kidney disease: Secondary | ICD-10-CM | POA: Insufficient documentation

## 2024-03-22 DIAGNOSIS — K5900 Constipation, unspecified: Secondary | ICD-10-CM | POA: Diagnosis not present

## 2024-03-22 DIAGNOSIS — I739 Peripheral vascular disease, unspecified: Secondary | ICD-10-CM | POA: Insufficient documentation

## 2024-03-22 DIAGNOSIS — Z7982 Long term (current) use of aspirin: Secondary | ICD-10-CM | POA: Diagnosis not present

## 2024-03-22 DIAGNOSIS — C50011 Malignant neoplasm of nipple and areola, right female breast: Secondary | ICD-10-CM

## 2024-03-22 DIAGNOSIS — R609 Edema, unspecified: Secondary | ICD-10-CM | POA: Diagnosis not present

## 2024-03-22 DIAGNOSIS — N189 Chronic kidney disease, unspecified: Secondary | ICD-10-CM | POA: Diagnosis not present

## 2024-03-22 DIAGNOSIS — Z79811 Long term (current) use of aromatase inhibitors: Secondary | ICD-10-CM | POA: Diagnosis not present

## 2024-03-22 DIAGNOSIS — Z86718 Personal history of other venous thrombosis and embolism: Secondary | ICD-10-CM | POA: Insufficient documentation

## 2024-03-22 DIAGNOSIS — D508 Other iron deficiency anemias: Secondary | ICD-10-CM

## 2024-03-22 DIAGNOSIS — C50911 Malignant neoplasm of unspecified site of right female breast: Secondary | ICD-10-CM | POA: Insufficient documentation

## 2024-03-22 DIAGNOSIS — D509 Iron deficiency anemia, unspecified: Secondary | ICD-10-CM | POA: Diagnosis not present

## 2024-03-22 LAB — CMP (CANCER CENTER ONLY)
ALT: 14 U/L (ref 0–44)
AST: 15 U/L (ref 15–41)
Albumin: 4.5 g/dL (ref 3.5–5.0)
Alkaline Phosphatase: 57 U/L (ref 38–126)
Anion gap: 11 (ref 5–15)
BUN: 20 mg/dL (ref 8–23)
CO2: 28 mmol/L (ref 22–32)
Calcium: 9.6 mg/dL (ref 8.9–10.3)
Chloride: 104 mmol/L (ref 98–111)
Creatinine: 0.76 mg/dL (ref 0.44–1.00)
GFR, Estimated: >60 mL/min (ref >60)
Glucose, Bld: 113 mg/dL — ABNORMAL HIGH (ref 70–99)
Potassium: 3.3 mmol/L — ABNORMAL LOW (ref 3.5–5.1)
Sodium: 143 mmol/L (ref 135–145)
Total Bilirubin: 0.5 mg/dL (ref 0.0–1.2)
Total Protein: 8 g/dL (ref 6.5–8.1)

## 2024-03-22 LAB — IRON AND IRON BINDING CAPACITY (CC-WL,HP ONLY)
Iron: 60 ug/dL (ref 28–170)
Saturation Ratios: 24 % (ref 10.4–31.8)
TIBC: 246 ug/dL — ABNORMAL LOW (ref 250–450)
UIBC: 186 ug/dL (ref 148–442)

## 2024-03-22 LAB — RETIC PANEL
Immature Retic Fract: 10.3 % (ref 2.3–15.9)
RBC.: 3.82 MIL/uL — ABNORMAL LOW (ref 3.87–5.11)
Retic Count, Absolute: 42 10*3/uL (ref 19.0–186.0)
Retic Ct Pct: 1.1 % (ref 0.4–3.1)
Reticulocyte Hemoglobin: 33 pg (ref >27.9)

## 2024-03-22 LAB — CBC WITH DIFFERENTIAL (CANCER CENTER ONLY)
Abs Immature Granulocytes: 0.02 10*3/uL (ref 0.00–0.07)
Basophils Absolute: 0 10*3/uL (ref 0.0–0.1)
Basophils Relative: 1 %
Eosinophils Absolute: 0.2 10*3/uL (ref 0.0–0.5)
Eosinophils Relative: 3 %
HCT: 34.7 % — ABNORMAL LOW (ref 36.0–46.0)
Hemoglobin: 11 g/dL — ABNORMAL LOW (ref 12.0–15.0)
Immature Granulocytes: 0 %
Lymphocytes Relative: 35 %
Lymphs Abs: 2.2 10*3/uL (ref 0.7–4.0)
MCH: 28.6 pg (ref 26.0–34.0)
MCHC: 31.7 g/dL (ref 30.0–36.0)
MCV: 90.4 fL (ref 80.0–100.0)
Monocytes Absolute: 0.5 10*3/uL (ref 0.1–1.0)
Monocytes Relative: 7 %
Neutro Abs: 3.5 10*3/uL (ref 1.7–7.7)
Neutrophils Relative %: 54 %
Platelet Count: 172 10*3/uL (ref 150–400)
RBC: 3.84 MIL/uL — ABNORMAL LOW (ref 3.87–5.11)
RDW: 13.9 % (ref 11.5–15.5)
WBC Count: 6.3 10*3/uL (ref 4.0–10.5)
nRBC: 0 % (ref 0.0–0.2)

## 2024-03-22 LAB — LACTATE DEHYDROGENASE: LDH: 202 U/L — ABNORMAL HIGH (ref 98–192)

## 2024-03-22 LAB — FERRITIN: Ferritin: 899 ng/mL — ABNORMAL HIGH (ref 11–307)

## 2024-03-22 NOTE — Progress Notes (Signed)
 Hematology and Oncology Follow Up Visit  [name] [id number] [birth year] 73 y.o. 03/22/2024   Principle Diagnosis:  Locally recurrent adenocarcinoma of the right breast History of superficial venous thrombus of the right thigh Iron deficiency anemia Erythropoietin  deficient anemia  Current Therapy:        Aromasin  25 mg p.o. daily - restarted on 09/23/2022 Aspirin 81 mg p.o. daily IV iron as needed Aranesp  300 mcg sq for Hgb less than 11    Interim History:  [name] is here today for follow-up.   Today she states that she is well other than her normal chronic fatigue.   She is followed by wound care for a non-healing wound of her chest from radiation treatment years ago that was complicated by an infection years ago. Not worsening but very slowly improving.   She reports some recent troubles with constipation. Non-bloody movements/ Last colonoscopy was about 10 years ago.   No breast concerns or changes noted by patient.  No mass, lesion or rash noted.  No adenopathy noted on exam.  She has chronic edema in her lower extremities she states due to PVD. No falls or syncope reported.  Appetite and hydration are good.  No fever, chills, n/v, cough, rash, dizziness, SOB, chest pain, palpitations  No blood loss noted. No bruising or petechiae.  Wt Readings from Last 3 Encounters:  03/22/24 186 lb 0.6 oz (84.4 kg)  02/09/24 187 lb 0.6 oz (84.8 kg)  11/17/23 191 lb 12.8 oz (87 kg)   ECOG Performance Status: 1 - Symptomatic but completely ambulatory  Medications:  Allergies as of 03/22/2024       Reactions   Contrast Media [iodinated Contrast Media] Hives, Shortness Of Breath, Nausea And Vomiting   Allergic reaction even after pre-meds.   Metrizamide Hives, Shortness Of Breath, Nausea And Vomiting   Allergic reaction even after pre-meds.   Other Shortness Of Breath, Nausea And Vomiting, Rash   Allergic reaction even after pre-meds.   Blue Dye #1 (brilliant Blue) Hives,  Nausea Only   Dye Fdc Blue [brilliant Blue Fcf (fd&c Blue #1)] Hives, Nausea Only        Medication List        Accurate as of March 22, 2024 11:39 AM. If you have any questions, ask your nurse or doctor.          amLODipine 10 MG tablet Commonly known as: NORVASC TAKE 1 TABLET BY MOUTH DAILY   amoxicillin 500 MG capsule Commonly known as: AMOXIL Before dental procedures   aspirin EC 81 MG tablet Take 81 mg by mouth daily.   celecoxib 200 MG capsule Commonly known as: CELEBREX Take 200 mg by mouth daily as needed.   exemestane  25 MG tablet Commonly known as: AROMASIN  Take 1 tablet (25 mg total) by mouth daily.   hydrochlorothiazide 25 MG tablet Commonly known as: HYDRODIURIL Take 25 mg by mouth daily.   IBUPROFEN PO Take 400 mg by mouth 2 (two) times daily as needed.   metoprolol tartrate 100 MG tablet Commonly known as: LOPRESSOR Take 100 mg by mouth daily.   simvastatin 20 MG tablet Commonly known as: ZOCOR Take 20 mg by mouth daily.   valsartan 160 MG tablet Commonly known as: DIOVAN Take 160 mg by mouth daily.   Vitamin D  (Ergocalciferol ) 1.25 MG (50000 UNIT) Caps capsule Commonly known as: DRISDOL  TAKE 1 CAPSULE BY MOUTH ONE TIME PER WEEK        Allergies:  Allergies  Allergen Reactions  Contrast Media [Iodinated Contrast Media] Hives, Shortness Of Breath and Nausea And Vomiting    Allergic reaction even after pre-meds.   Metrizamide Hives, Shortness Of Breath and Nausea And Vomiting    Allergic reaction even after pre-meds.   Other Shortness Of Breath, Nausea And Vomiting and Rash    Allergic reaction even after pre-meds.   Blue Dye #1 (Brilliant Blue) Hives and Nausea Only   Dye Fdc Blue [Brilliant Blue Fcf (Fd&C Blue #1)] Hives and Nausea Only    Past Medical History, Surgical history, Social history, and Family History were reviewed and updated.  Review of Systems: All other 10 point review of systems is negative.   Physical  Exam:  height is 5\' 4"  (1.626 m) and weight is 186 lb 0.6 oz (84.4 kg). Her oral temperature is 98.3 F (36.8 C). Her blood pressure is 149/55 (abnormal) and her pulse is 90. Her respiration is 18 and oxygen saturation is 100%.   Wt Readings from Last 3 Encounters:  03/22/24 186 lb 0.6 oz (84.4 kg)  02/09/24 187 lb 0.6 oz (84.8 kg)  11/17/23 191 lb 12.8 oz (87 kg)    Ocular: Sclerae unicteric, pupils equal, round and reactive to light Ear-nose-throat: Oropharynx clear, dentition fair Lymphatic: No cervical, supraclavicular or axillary adenopathy Lungs no rales or rhonchi, good excursion bilaterally Heart regular rate and rhythm, no murmur appreciated Abd soft, nontender, positive bowel sounds MSK no focal spinal tenderness, no joint edema Neuro: non-focal, well-oriented, appropriate affect Lab Results  Component Value Date   WBC 6.3 03/22/2024   HGB 11.0 (L) 03/22/2024   HCT 34.7 (L) 03/22/2024   MCV 90.4 03/22/2024   PLT 172 03/22/2024   Lab Results  Component Value Date   FERRITIN 779 (H) 02/09/2024   IRON 55 02/09/2024   TIBC 258 02/09/2024   UIBC 203 02/09/2024   IRONPCTSAT 21 02/09/2024   Lab Results  Component Value Date   RETICCTPCT 1.1 03/22/2024   RBC 3.82 (L) 03/22/2024   RBC 3.84 (L) 03/22/2024   RETICCTABS 55.2 06/20/2011   No results found for: "KPAFRELGTCHN", "LAMBDASER", "KAPLAMBRATIO" No results found for: "IGGSERUM", "IGA", "IGMSERUM" Lab Results  Component Value Date   TOTALPROTELP 6.6 08/14/2010   ALBUMINELP 53.8 (L) 08/14/2010   A1GS 5.3 (H) 08/14/2010   A2GS 14.3 (H) 08/14/2010   BETS 5.8 08/14/2010   BETA2SER 6.3 08/14/2010   GAMS 14.5 08/14/2010   MSPIKE NOT DET 08/14/2010   SPEI * 08/14/2010     Chemistry      Component Value Date/Time   NA 143 03/22/2024 0958   NA 146 (H) 07/23/2017 0900   NA 144 02/18/2017 0901   K 3.3 (L) 03/22/2024 0958   K 3.7 07/23/2017 0900   K 3.7 02/18/2017 0901   CL 104 03/22/2024 0958   CL 107  07/23/2017 0900   CO2 28 03/22/2024 0958   CO2 31 07/23/2017 0900   CO2 27 02/18/2017 0901   BUN 20 03/22/2024 0958   BUN 16 07/23/2017 0900   BUN 17.7 02/18/2017 0901   CREATININE 0.76 03/22/2024 0958   CREATININE 0.8 07/23/2017 0900   CREATININE 0.7 02/18/2017 0901      Component Value Date/Time   CALCIUM 9.6 03/22/2024 0958   CALCIUM 9.3 07/23/2017 0900   CALCIUM 9.3 02/18/2017 0901   ALKPHOS 57 03/22/2024 0958   ALKPHOS 67 07/23/2017 0900   ALKPHOS 78 02/18/2017 0901   AST 15 03/22/2024 0958   AST 13 02/18/2017 0901  ALT 14 03/22/2024 0958   ALT 20 07/23/2017 0900   ALT 12 02/18/2017 0901   BILITOT 0.5 03/22/2024 0958   BILITOT 0.54 02/18/2017 0901      Encounter Diagnoses  Name Primary?   Iron deficiency anemia secondary to inadequate dietary iron intake Yes   Malignant neoplasm of nipple of right breast in female, unspecified estrogen receptor status (HCC)    Erythropoietin  deficiency anemia    Impression and Plan: Ms. Mealy is a very pleasant 73 yo caucasian female with history of locally recurrent adenocarcinoma of the right breast with resection and radiation. Also seen and followed for IDA and erythropoietin  deficiency.   PET scan on 12/11/2023 non-concerning for recurrence of disease  DEXA was on 08/07/2022 with a Tscore of -1.0 Last mammogram of left breast was on 09/24/2023- BI-RADS 2  I have recommended GI follow up and daily Miralax.   Iron studies are pending. Will replace if needed ESA held today, Hgb 11.0 Continue same regimen with Aromasin . She is tolerating nicely.   Continue with wound care follow up  Disposition No ESA today RTC 6 weeks APP, labs (CBC w/, CMP, LDH, CA 27.29, iron, ferritin, retic), ESA  Sharla Davis, PA-C 6/10/202511:39 AM

## 2024-03-23 ENCOUNTER — Encounter: Payer: Self-pay | Admitting: Family

## 2024-03-23 ENCOUNTER — Other Ambulatory Visit: Payer: Self-pay | Admitting: Medical Oncology

## 2024-03-23 DIAGNOSIS — R7989 Other specified abnormal findings of blood chemistry: Secondary | ICD-10-CM

## 2024-03-23 DIAGNOSIS — R5383 Other fatigue: Secondary | ICD-10-CM

## 2024-03-23 LAB — CANCER ANTIGEN 27.29: CA 27.29: 26.8 U/mL (ref 0.0–38.6)

## 2024-03-23 NOTE — Telephone Encounter (Signed)
-----   Message from Sharla Davis sent at 03/23/2024 10:34 AM EDT ----- I have added some additional labs to her upcoming follow up visit to investigate further as her ferritin and LDH have trended up. Tumor marker is up slightly but tends to reduce on its own looking at past history. We will watch this closely. Again, I would suggest a second opinion on your non-healing wound.

## 2024-03-23 NOTE — Telephone Encounter (Signed)
Called and informed patient of lab results, patient verbalized understanding and denies any questions or concerns at this time.   

## 2024-03-24 ENCOUNTER — Other Ambulatory Visit: Payer: Self-pay | Admitting: *Deleted

## 2024-03-24 DIAGNOSIS — R5383 Other fatigue: Secondary | ICD-10-CM

## 2024-03-28 DIAGNOSIS — R7303 Prediabetes: Secondary | ICD-10-CM | POA: Diagnosis not present

## 2024-03-28 DIAGNOSIS — E782 Mixed hyperlipidemia: Secondary | ICD-10-CM | POA: Diagnosis not present

## 2024-04-04 DIAGNOSIS — R7303 Prediabetes: Secondary | ICD-10-CM | POA: Diagnosis not present

## 2024-04-04 DIAGNOSIS — I10 Essential (primary) hypertension: Secondary | ICD-10-CM | POA: Diagnosis not present

## 2024-04-04 DIAGNOSIS — E782 Mixed hyperlipidemia: Secondary | ICD-10-CM | POA: Diagnosis not present

## 2024-04-04 DIAGNOSIS — Z6832 Body mass index (BMI) 32.0-32.9, adult: Secondary | ICD-10-CM | POA: Diagnosis not present

## 2024-04-20 ENCOUNTER — Encounter (HOSPITAL_BASED_OUTPATIENT_CLINIC_OR_DEPARTMENT_OTHER): Attending: Internal Medicine | Admitting: Internal Medicine

## 2024-04-20 DIAGNOSIS — L589 Radiodermatitis, unspecified: Secondary | ICD-10-CM | POA: Diagnosis not present

## 2024-04-20 DIAGNOSIS — I10 Essential (primary) hypertension: Secondary | ICD-10-CM | POA: Insufficient documentation

## 2024-04-20 DIAGNOSIS — S21001A Unspecified open wound of right breast, initial encounter: Secondary | ICD-10-CM | POA: Insufficient documentation

## 2024-04-20 DIAGNOSIS — L98492 Non-pressure chronic ulcer of skin of other sites with fat layer exposed: Secondary | ICD-10-CM | POA: Diagnosis not present

## 2024-04-20 DIAGNOSIS — L598 Other specified disorders of the skin and subcutaneous tissue related to radiation: Secondary | ICD-10-CM | POA: Diagnosis not present

## 2024-05-04 ENCOUNTER — Inpatient Hospital Stay

## 2024-05-04 ENCOUNTER — Encounter: Payer: Self-pay | Admitting: Medical Oncology

## 2024-05-04 ENCOUNTER — Inpatient Hospital Stay: Attending: Hematology & Oncology

## 2024-05-04 ENCOUNTER — Inpatient Hospital Stay (HOSPITAL_BASED_OUTPATIENT_CLINIC_OR_DEPARTMENT_OTHER): Admitting: Medical Oncology

## 2024-05-04 VITALS — BP 151/55 | HR 75 | Temp 97.8°F | Resp 18 | Wt 181.1 lb

## 2024-05-04 DIAGNOSIS — D508 Other iron deficiency anemias: Secondary | ICD-10-CM | POA: Diagnosis not present

## 2024-05-04 DIAGNOSIS — C50911 Malignant neoplasm of unspecified site of right female breast: Secondary | ICD-10-CM | POA: Insufficient documentation

## 2024-05-04 DIAGNOSIS — Z9011 Acquired absence of right breast and nipple: Secondary | ICD-10-CM | POA: Insufficient documentation

## 2024-05-04 DIAGNOSIS — Z7982 Long term (current) use of aspirin: Secondary | ICD-10-CM | POA: Insufficient documentation

## 2024-05-04 DIAGNOSIS — D509 Iron deficiency anemia, unspecified: Secondary | ICD-10-CM | POA: Insufficient documentation

## 2024-05-04 DIAGNOSIS — R5383 Other fatigue: Secondary | ICD-10-CM

## 2024-05-04 DIAGNOSIS — Z79811 Long term (current) use of aromatase inhibitors: Secondary | ICD-10-CM | POA: Insufficient documentation

## 2024-05-04 DIAGNOSIS — D631 Anemia in chronic kidney disease: Secondary | ICD-10-CM

## 2024-05-04 DIAGNOSIS — T8189XS Other complications of procedures, not elsewhere classified, sequela: Secondary | ICD-10-CM | POA: Diagnosis not present

## 2024-05-04 DIAGNOSIS — C50011 Malignant neoplasm of nipple and areola, right female breast: Secondary | ICD-10-CM

## 2024-05-04 DIAGNOSIS — R609 Edema, unspecified: Secondary | ICD-10-CM | POA: Diagnosis not present

## 2024-05-04 DIAGNOSIS — Z86718 Personal history of other venous thrombosis and embolism: Secondary | ICD-10-CM | POA: Insufficient documentation

## 2024-05-04 DIAGNOSIS — I739 Peripheral vascular disease, unspecified: Secondary | ICD-10-CM | POA: Insufficient documentation

## 2024-05-04 DIAGNOSIS — N189 Chronic kidney disease, unspecified: Secondary | ICD-10-CM | POA: Insufficient documentation

## 2024-05-04 DIAGNOSIS — Z6832 Body mass index (BMI) 32.0-32.9, adult: Secondary | ICD-10-CM | POA: Diagnosis not present

## 2024-05-04 DIAGNOSIS — Z923 Personal history of irradiation: Secondary | ICD-10-CM | POA: Insufficient documentation

## 2024-05-04 DIAGNOSIS — R7989 Other specified abnormal findings of blood chemistry: Secondary | ICD-10-CM

## 2024-05-04 LAB — RETIC PANEL
Immature Retic Fract: 13.1 % (ref 2.3–15.9)
RBC.: 3.82 MIL/uL — ABNORMAL LOW (ref 3.87–5.11)
Retic Count, Absolute: 55.4 K/uL (ref 19.0–186.0)
Retic Ct Pct: 1.5 % (ref 0.4–3.1)
Reticulocyte Hemoglobin: 33.1 pg (ref >27.9)

## 2024-05-04 LAB — FERRITIN: Ferritin: 567 ng/mL — ABNORMAL HIGH (ref 11–307)

## 2024-05-04 LAB — CMP (CANCER CENTER ONLY)
ALT: 16 U/L (ref 0–44)
AST: 21 U/L (ref 15–41)
Albumin: 4.4 g/dL (ref 3.5–5.0)
Alkaline Phosphatase: 65 U/L (ref 38–126)
Anion gap: 12 (ref 5–15)
BUN: 21 mg/dL (ref 8–23)
CO2: 26 mmol/L (ref 22–32)
Calcium: 9.5 mg/dL (ref 8.9–10.3)
Chloride: 105 mmol/L (ref 98–111)
Creatinine: 0.75 mg/dL (ref 0.44–1.00)
GFR, Estimated: >60 mL/min (ref >60)
Glucose, Bld: 113 mg/dL — ABNORMAL HIGH (ref 70–99)
Potassium: 3.6 mmol/L (ref 3.5–5.1)
Sodium: 143 mmol/L (ref 135–145)
Total Bilirubin: 0.5 mg/dL (ref 0.0–1.2)
Total Protein: 7.9 g/dL (ref 6.5–8.1)

## 2024-05-04 LAB — CBC WITH DIFFERENTIAL (CANCER CENTER ONLY)
Abs Immature Granulocytes: 0.02 K/uL (ref 0.00–0.07)
Basophils Absolute: 0 K/uL (ref 0.0–0.1)
Basophils Relative: 1 %
Eosinophils Absolute: 0.2 K/uL (ref 0.0–0.5)
Eosinophils Relative: 3 %
HCT: 34.7 % — ABNORMAL LOW (ref 36.0–46.0)
Hemoglobin: 11 g/dL — ABNORMAL LOW (ref 12.0–15.0)
Immature Granulocytes: 0 %
Lymphocytes Relative: 32 %
Lymphs Abs: 2 K/uL (ref 0.7–4.0)
MCH: 28.9 pg (ref 26.0–34.0)
MCHC: 31.7 g/dL (ref 30.0–36.0)
MCV: 91.1 fL (ref 80.0–100.0)
Monocytes Absolute: 0.5 K/uL (ref 0.1–1.0)
Monocytes Relative: 7 %
Neutro Abs: 3.6 K/uL (ref 1.7–7.7)
Neutrophils Relative %: 57 %
Platelet Count: 193 K/uL (ref 150–400)
RBC: 3.81 MIL/uL — ABNORMAL LOW (ref 3.87–5.11)
RDW: 14 % (ref 11.5–15.5)
WBC Count: 6.4 K/uL (ref 4.0–10.5)
nRBC: 0 % (ref 0.0–0.2)

## 2024-05-04 LAB — IRON AND IRON BINDING CAPACITY (CC-WL,HP ONLY)
Iron: 55 ug/dL (ref 28–170)
Saturation Ratios: 22 % (ref 10.4–31.8)
TIBC: 253 ug/dL (ref 250–450)
UIBC: 198 ug/dL

## 2024-05-04 LAB — TSH: TSH: 2.84 u[IU]/mL (ref 0.350–4.500)

## 2024-05-04 LAB — C-REACTIVE PROTEIN: CRP: 0.5 mg/dL (ref <1.0)

## 2024-05-04 LAB — SEDIMENTATION RATE: Sed Rate: 54 mm/h — ABNORMAL HIGH (ref 0–22)

## 2024-05-04 LAB — LACTATE DEHYDROGENASE: LDH: 208 U/L — ABNORMAL HIGH (ref 98–192)

## 2024-05-04 NOTE — Progress Notes (Signed)
 Hematology and Oncology Follow Up Visit  [name] [id number] May 03, 1951 73 y.o. 05/04/2024   Principle Diagnosis:  Locally recurrent adenocarcinoma of the right breast History of superficial venous thrombus of the right thigh Iron deficiency anemia Erythropoietin  deficient anemia  Current Therapy:        Aromasin  25 mg p.o. daily - restarted on 09/23/2022 Aspirin 81 mg p.o. daily IV iron as needed Aranesp  300 mcg sq for Hgb less than 11    Interim History:  Ms. Wormley is here today for follow-up.   Today she states that she is doing ok. At our last visit we discussed her constipation. She has tried fiber supplementation which helps some. She is getting set up for a colonoscopy with GI via her PCP. Non-bloody movements.   She is followed by wound care for a non-healing wound of her chest from radiation treatment years ago that was complicated by an infection years ago. Not worsening but very slowly improving. She is getting a second opinion in [place] soon which she is excited about.  No breast concerns or changes noted by patient.  No mass, lesion or rash noted.  No adenopathy noted on exam.  She has chronic edema in her lower extremities she states due to PVD. No falls or syncope reported.  Appetite and hydration are good.  No fever, chills, n/v, cough, rash, dizziness, SOB, chest pain, palpitations  No blood loss noted. No bruising or petechiae.  Wt Readings from Last 3 Encounters:  05/04/24 181 lb 1.9 oz (82.2 kg)  03/22/24 186 lb 0.6 oz (84.4 kg)  02/09/24 187 lb 0.6 oz (84.8 kg)   ECOG Performance Status: 1 - Symptomatic but completely ambulatory  Medications:  Allergies as of 05/04/2024       Reactions   Contrast Media [iodinated Contrast Media] Hives, Shortness Of Breath, Nausea And Vomiting   Allergic reaction even after pre-meds.   Metrizamide Hives, Shortness Of Breath, Nausea And Vomiting   Allergic reaction even after pre-meds.   Other Shortness Of Breath,  Nausea And Vomiting, Rash   Allergic reaction even after pre-meds.   Blue Dye #1 (brilliant Blue) Hives, Nausea Only   Dye Fdc Blue [brilliant Blue Fcf (fd&c Blue #1)] Hives, Nausea Only        Medication List        Accurate as of May 04, 2024 10:50 AM. If you have any questions, ask your nurse or doctor.          acetaminophen 500 MG tablet Commonly known as: TYLENOL Take 500 mg by mouth every 6 (six) hours as needed.   acetaminophen 650 MG CR tablet Commonly known as: TYLENOL Take 650 mg by mouth every 8 (eight) hours as needed for pain.   amLODipine 10 MG tablet Commonly known as: NORVASC TAKE 1 TABLET BY MOUTH DAILY   amoxicillin 500 MG capsule Commonly known as: AMOXIL Before dental procedures   aspirin EC 81 MG tablet Take 81 mg by mouth daily.   celecoxib 200 MG capsule Commonly known as: CELEBREX Take 200 mg by mouth daily as needed.   exemestane  25 MG tablet Commonly known as: AROMASIN  Take 1 tablet (25 mg total) by mouth daily.   hydrochlorothiazide 25 MG tablet Commonly known as: HYDRODIURIL Take 25 mg by mouth daily.   IBUPROFEN PO Take 400 mg by mouth 2 (two) times daily as needed.   metoprolol tartrate 100 MG tablet Commonly known as: LOPRESSOR Take 100 mg by mouth daily.   psyllium  95 % Pack Commonly known as: HYDROCIL/METAMUCIL Take 1 packet by mouth daily.   simvastatin 20 MG tablet Commonly known as: ZOCOR Take 20 mg by mouth daily.   valsartan 160 MG tablet Commonly known as: DIOVAN Take 160 mg by mouth daily.   Vitamin D  (Ergocalciferol ) 1.25 MG (50000 UNIT) Caps capsule Commonly known as: DRISDOL  TAKE 1 CAPSULE BY MOUTH ONE TIME PER WEEK        Allergies:  Allergies  Allergen Reactions   Contrast Media [Iodinated Contrast Media] Hives, Shortness Of Breath and Nausea And Vomiting    Allergic reaction even after pre-meds.   Metrizamide Hives, Shortness Of Breath and Nausea And Vomiting    Allergic reaction even  after pre-meds.   Other Shortness Of Breath, Nausea And Vomiting and Rash    Allergic reaction even after pre-meds.   Blue Dye #1 (Brilliant Blue) Hives and Nausea Only   Dye Fdc Blue [Brilliant Blue Fcf (Fd&C Blue #1)] Hives and Nausea Only    Past Medical History, Surgical history, Social history, and Family History were reviewed and updated.  Review of Systems: All other 10 point review of systems is negative.   Physical Exam:  weight is 181 lb 1.9 oz (82.2 kg). Her oral temperature is 97.8 F (36.6 C). Her blood pressure is 151/55 (abnormal) and her pulse is 75. Her respiration is 18 and oxygen saturation is 100%.   Wt Readings from Last 3 Encounters:  05/04/24 181 lb 1.9 oz (82.2 kg)  03/22/24 186 lb 0.6 oz (84.4 kg)  02/09/24 187 lb 0.6 oz (84.8 kg)    Ocular: Sclerae unicteric, pupils equal, round and reactive to light Ear-nose-throat: Oropharynx clear, dentition fair Lymphatic: No cervical, supraclavicular or axillary adenopathy Lungs no rales or rhonchi, good excursion bilaterally Heart regular rate and rhythm, no murmur appreciated Abd soft, nontender, positive bowel sounds MSK no focal spinal tenderness, no joint edema Neuro: non-focal, well-oriented, appropriate affect Lab Results  Component Value Date   WBC 6.4 05/04/2024   HGB 11.0 (L) 05/04/2024   HCT 34.7 (L) 05/04/2024   MCV 91.1 05/04/2024   PLT 193 05/04/2024   Lab Results  Component Value Date   FERRITIN 899 (H) 03/22/2024   IRON 60 03/22/2024   TIBC 246 (L) 03/22/2024   UIBC 186 03/22/2024   IRONPCTSAT 24 03/22/2024   Lab Results  Component Value Date   RETICCTPCT 1.5 05/04/2024   RBC 3.82 (L) 05/04/2024   RETICCTABS 55.2 06/20/2011   No results found for: KPAFRELGTCHN, LAMBDASER, KAPLAMBRATIO No results found for: KIMBERLY LE [facility] Lab Results  Component Value Date   TOTALPROTELP 6.6 08/14/2010   ALBUMINELP 53.8 (L) 08/14/2010   A1GS 5.3 (H) 08/14/2010   A2GS 14.3 (H)  08/14/2010   BETS 5.8 08/14/2010   BETA2SER 6.3 08/14/2010   GAMS 14.5 08/14/2010   MSPIKE NOT DET 08/14/2010   SPEI * 08/14/2010     Chemistry      Component Value Date/Time   NA 143 05/04/2024 1000   NA 146 (H) 07/23/2017 0900   NA 144 02/18/2017 0901   K 3.6 05/04/2024 1000   K 3.7 07/23/2017 0900   K 3.7 02/18/2017 0901   CL 105 05/04/2024 1000   CL 107 07/23/2017 0900   CO2 26 05/04/2024 1000   CO2 31 07/23/2017 0900   CO2 27 02/18/2017 0901   BUN 21 05/04/2024 1000   BUN 16 07/23/2017 0900   BUN 17.7 02/18/2017 0901   CREATININE 0.75 05/04/2024  1000   CREATININE 0.8 07/23/2017 0900   CREATININE 0.7 02/18/2017 0901      Component Value Date/Time   CALCIUM 9.5 05/04/2024 1000   CALCIUM 9.3 07/23/2017 0900   CALCIUM 9.3 02/18/2017 0901   ALKPHOS 65 05/04/2024 1000   ALKPHOS 67 07/23/2017 0900   ALKPHOS 78 02/18/2017 0901   AST 21 05/04/2024 1000   AST 13 02/18/2017 0901   ALT 16 05/04/2024 1000   ALT 20 07/23/2017 0900   ALT 12 02/18/2017 0901   BILITOT 0.5 05/04/2024 1000   BILITOT 0.54 02/18/2017 0901      Encounter Diagnoses  Name Primary?   Other fatigue Yes   Iron deficiency anemia secondary to inadequate dietary iron intake    Erythropoietin  deficiency anemia     Impression and Plan: Ms. Uram is a very pleasant 74 yo caucasian female with history of locally recurrent adenocarcinoma of the right breast with resection and radiation. Also seen and followed for IDA and erythropoietin  deficiency.   PET scan on 12/11/2023 non-concerning for recurrence of disease  DEXA was on 08/07/2022 with a Tscore of -1.0 Last mammogram of left breast was on 09/24/2023- BI-RADS 2  Iron studies are pending. Will replace if needed ESA held today, Hgb 11.0 Continue same regimen with Aromasin . She is tolerating nicely.   Continue with wound care follow up  Disposition No ESA today RTC 6 weeks APP, labs (CBC w/, CMP, LDH, CA 27.29, iron, ferritin, retic), ESA  Lauraine CHRISTELLA Dais, PA-C 7/23/202510:50 AM

## 2024-05-05 ENCOUNTER — Ambulatory Visit: Payer: Self-pay | Admitting: Medical Oncology

## 2024-05-05 DIAGNOSIS — L602 Onychogryphosis: Secondary | ICD-10-CM | POA: Diagnosis not present

## 2024-05-05 LAB — CANCER ANTIGEN 27.29: CA 27.29: 25.4 U/mL (ref 0.0–38.6)

## 2024-05-05 NOTE — Telephone Encounter (Signed)
 LMOM to call the office for lab results. No DPR- unable to leave a detailed message.

## 2024-05-05 NOTE — Telephone Encounter (Signed)
-----   Message from Lauraine CHRISTELLA Dais sent at 05/05/2024  9:05 AM EDT ----- Iron levels are good Sedimentation rate is elevated again- rheumatology consult suggested as we have discussed.  TSH- thyroid  function is normal Tumor marker is down a bit (good new) ----- Message ----- From: Interface, Lab In [place] Sent: 05/04/2024  10:12 AM EDT To: Lauraine CHRISTELLA Dais, PA-C

## 2024-05-05 NOTE — Telephone Encounter (Signed)
 Pt r/c and spoke with Shanda DASEN, RN. Results given to pt. Pt declines any questions or concerns at this time.

## 2024-05-10 DIAGNOSIS — T8189XA Other complications of procedures, not elsewhere classified, initial encounter: Secondary | ICD-10-CM | POA: Diagnosis not present

## 2024-05-10 DIAGNOSIS — I890 Lymphedema, not elsewhere classified: Secondary | ICD-10-CM | POA: Diagnosis not present

## 2024-05-17 DIAGNOSIS — T8189XA Other complications of procedures, not elsewhere classified, initial encounter: Secondary | ICD-10-CM | POA: Diagnosis not present

## 2024-05-17 DIAGNOSIS — I972 Postmastectomy lymphedema syndrome: Secondary | ICD-10-CM | POA: Diagnosis not present

## 2024-05-18 ENCOUNTER — Encounter (HOSPITAL_BASED_OUTPATIENT_CLINIC_OR_DEPARTMENT_OTHER): Admitting: Internal Medicine

## 2024-05-24 DIAGNOSIS — I972 Postmastectomy lymphedema syndrome: Secondary | ICD-10-CM | POA: Diagnosis not present

## 2024-05-24 DIAGNOSIS — T8189XA Other complications of procedures, not elsewhere classified, initial encounter: Secondary | ICD-10-CM | POA: Diagnosis not present

## 2024-05-24 DIAGNOSIS — T8189XS Other complications of procedures, not elsewhere classified, sequela: Secondary | ICD-10-CM | POA: Diagnosis not present

## 2024-06-07 DIAGNOSIS — I972 Postmastectomy lymphedema syndrome: Secondary | ICD-10-CM | POA: Diagnosis not present

## 2024-06-07 DIAGNOSIS — T8189XA Other complications of procedures, not elsewhere classified, initial encounter: Secondary | ICD-10-CM | POA: Diagnosis not present

## 2024-06-07 DIAGNOSIS — T8189XS Other complications of procedures, not elsewhere classified, sequela: Secondary | ICD-10-CM | POA: Diagnosis not present

## 2024-06-15 ENCOUNTER — Ambulatory Visit: Admitting: Medical Oncology

## 2024-06-15 ENCOUNTER — Ambulatory Visit

## 2024-06-15 ENCOUNTER — Inpatient Hospital Stay

## 2024-06-21 DIAGNOSIS — T8189XS Other complications of procedures, not elsewhere classified, sequela: Secondary | ICD-10-CM | POA: Diagnosis not present

## 2024-06-21 DIAGNOSIS — T8189XA Other complications of procedures, not elsewhere classified, initial encounter: Secondary | ICD-10-CM | POA: Diagnosis not present

## 2024-06-22 ENCOUNTER — Inpatient Hospital Stay

## 2024-06-22 ENCOUNTER — Ambulatory Visit: Admitting: Medical Oncology

## 2024-06-22 ENCOUNTER — Ambulatory Visit

## 2024-06-29 ENCOUNTER — Ambulatory Visit

## 2024-06-29 ENCOUNTER — Ambulatory Visit: Admitting: Medical Oncology

## 2024-06-29 ENCOUNTER — Inpatient Hospital Stay

## 2024-07-05 DIAGNOSIS — T8189XA Other complications of procedures, not elsewhere classified, initial encounter: Secondary | ICD-10-CM | POA: Diagnosis not present

## 2024-07-05 DIAGNOSIS — T8189XS Other complications of procedures, not elsewhere classified, sequela: Secondary | ICD-10-CM | POA: Diagnosis not present

## 2024-07-07 ENCOUNTER — Inpatient Hospital Stay

## 2024-07-07 ENCOUNTER — Inpatient Hospital Stay: Attending: Hematology & Oncology

## 2024-07-07 ENCOUNTER — Inpatient Hospital Stay (HOSPITAL_BASED_OUTPATIENT_CLINIC_OR_DEPARTMENT_OTHER): Admitting: Medical Oncology

## 2024-07-07 VITALS — BP 136/51 | HR 67 | Temp 98.4°F | Resp 18 | Ht 64.0 in | Wt 178.1 lb

## 2024-07-07 DIAGNOSIS — D631 Anemia in chronic kidney disease: Secondary | ICD-10-CM | POA: Diagnosis not present

## 2024-07-07 DIAGNOSIS — Z79811 Long term (current) use of aromatase inhibitors: Secondary | ICD-10-CM | POA: Insufficient documentation

## 2024-07-07 DIAGNOSIS — N189 Chronic kidney disease, unspecified: Secondary | ICD-10-CM | POA: Insufficient documentation

## 2024-07-07 DIAGNOSIS — D508 Other iron deficiency anemias: Secondary | ICD-10-CM | POA: Diagnosis not present

## 2024-07-07 DIAGNOSIS — Z86718 Personal history of other venous thrombosis and embolism: Secondary | ICD-10-CM | POA: Insufficient documentation

## 2024-07-07 DIAGNOSIS — R609 Edema, unspecified: Secondary | ICD-10-CM | POA: Insufficient documentation

## 2024-07-07 DIAGNOSIS — Z7982 Long term (current) use of aspirin: Secondary | ICD-10-CM | POA: Diagnosis not present

## 2024-07-07 DIAGNOSIS — E611 Iron deficiency: Secondary | ICD-10-CM | POA: Insufficient documentation

## 2024-07-07 DIAGNOSIS — I739 Peripheral vascular disease, unspecified: Secondary | ICD-10-CM | POA: Diagnosis not present

## 2024-07-07 DIAGNOSIS — D500 Iron deficiency anemia secondary to blood loss (chronic): Secondary | ICD-10-CM

## 2024-07-07 DIAGNOSIS — C50011 Malignant neoplasm of nipple and areola, right female breast: Secondary | ICD-10-CM

## 2024-07-07 DIAGNOSIS — Z923 Personal history of irradiation: Secondary | ICD-10-CM | POA: Diagnosis not present

## 2024-07-07 DIAGNOSIS — C50911 Malignant neoplasm of unspecified site of right female breast: Secondary | ICD-10-CM | POA: Diagnosis not present

## 2024-07-07 LAB — RETIC PANEL
Immature Retic Fract: 10.1 % (ref 2.3–15.9)
RBC.: 3.72 MIL/uL — ABNORMAL LOW (ref 3.87–5.11)
Retic Count, Absolute: 50.2 K/uL (ref 19.0–186.0)
Retic Ct Pct: 1.4 % (ref 0.4–3.1)
Reticulocyte Hemoglobin: 32.7 pg (ref >27.9)

## 2024-07-07 LAB — CBC WITH DIFFERENTIAL (CANCER CENTER ONLY)
Abs Immature Granulocytes: 0.03 K/uL (ref 0.00–0.07)
Basophils Absolute: 0.1 K/uL (ref 0.0–0.1)
Basophils Relative: 1 %
Eosinophils Absolute: 0.3 K/uL (ref 0.0–0.5)
Eosinophils Relative: 4 %
HCT: 34.3 % — ABNORMAL LOW (ref 36.0–46.0)
Hemoglobin: 10.8 g/dL — ABNORMAL LOW (ref 12.0–15.0)
Immature Granulocytes: 0 %
Lymphocytes Relative: 33 %
Lymphs Abs: 2.4 K/uL (ref 0.7–4.0)
MCH: 29.2 pg (ref 26.0–34.0)
MCHC: 31.5 g/dL (ref 30.0–36.0)
MCV: 92.7 fL (ref 80.0–100.0)
Monocytes Absolute: 0.6 K/uL (ref 0.1–1.0)
Monocytes Relative: 9 %
Neutro Abs: 4 K/uL (ref 1.7–7.7)
Neutrophils Relative %: 53 %
Platelet Count: 201 K/uL (ref 150–400)
RBC: 3.7 MIL/uL — ABNORMAL LOW (ref 3.87–5.11)
RDW: 13.7 % (ref 11.5–15.5)
WBC Count: 7.4 K/uL (ref 4.0–10.5)
nRBC: 0 % (ref 0.0–0.2)

## 2024-07-07 LAB — CMP (CANCER CENTER ONLY)
ALT: 14 U/L (ref 0–44)
AST: 19 U/L (ref 15–41)
Albumin: 4.3 g/dL (ref 3.5–5.0)
Alkaline Phosphatase: 64 U/L (ref 38–126)
Anion gap: 12 (ref 5–15)
BUN: 17 mg/dL (ref 8–23)
CO2: 27 mmol/L (ref 22–32)
Calcium: 9.5 mg/dL (ref 8.9–10.3)
Chloride: 106 mmol/L (ref 98–111)
Creatinine: 0.78 mg/dL (ref 0.44–1.00)
GFR, Estimated: >60 mL/min (ref >60)
Glucose, Bld: 110 mg/dL — ABNORMAL HIGH (ref 70–99)
Potassium: 3.9 mmol/L (ref 3.5–5.1)
Sodium: 144 mmol/L (ref 135–145)
Total Bilirubin: 0.5 mg/dL (ref 0.0–1.2)
Total Protein: 7.9 g/dL (ref 6.5–8.1)

## 2024-07-07 LAB — FERRITIN: Ferritin: 827 ng/mL — ABNORMAL HIGH (ref 11–307)

## 2024-07-07 LAB — IRON AND IRON BINDING CAPACITY (CC-WL,HP ONLY)
Iron: 53 ug/dL (ref 28–170)
Saturation Ratios: 21 % (ref 10.4–31.8)
TIBC: 252 ug/dL (ref 250–450)
UIBC: 199 ug/dL

## 2024-07-07 LAB — LACTATE DEHYDROGENASE: LDH: 211 U/L — ABNORMAL HIGH (ref 98–192)

## 2024-07-07 MED ORDER — DARBEPOETIN ALFA 300 MCG/0.6ML IJ SOSY
300.0000 ug | PREFILLED_SYRINGE | Freq: Once | INTRAMUSCULAR | Status: AC
  Administered 2024-07-07: 300 ug via SUBCUTANEOUS
  Filled 2024-07-07: qty 0.6

## 2024-07-07 NOTE — Patient Instructions (Signed)

## 2024-07-07 NOTE — Progress Notes (Addendum)
 Hematology and Oncology Follow Up Visit  [name] [id number] [birth year] 73 y.o. 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 daily - restarted on 09/23/2022 Aspirin 81 mg p.o. daily IV iron as needed Aranesp  300 mcg sq for Hgb less than 11    Interim History:  Ms. [name] is here today for follow-up.   Today she states that she is doing ok. She is now with the wound center in [place]. They are trying a silver  sulfate cream/dressing which she is hopeful about. She is happy with this.   She is still taking care of her husband.   No breast concerns or changes noted by patient.  No mass, lesion or rash noted.  No adenopathy noted on exam.  She has chronic edema in her lower extremities she states due to PVD. No falls or syncope reported.  Appetite and hydration are good.  No fever, chills, n/v, cough, rash, dizziness, SOB, chest pain, palpitations  No blood loss noted. No bruising or petechiae.  Wt Readings from Last 3 Encounters:  07/07/24 178 lb 1.3 oz (80.8 kg)  05/04/24 181 lb 1.9 oz (82.2 kg)  03/22/24 186 lb 0.6 oz (84.4 kg)   ECOG Performance Status: 1 - Symptomatic but completely ambulatory  Medications:  Allergies as of 07/07/2024       Reactions   Contrast Media [iodinated Contrast Media] Hives, Shortness Of Breath, Nausea And Vomiting   Allergic reaction even after pre-meds.   Metrizamide Hives, Shortness Of Breath, Nausea And Vomiting   Allergic reaction even after pre-meds.   Other Shortness Of Breath, Nausea And Vomiting, Rash   Allergic reaction even after pre-meds.   Blue Dye #1 (brilliant Blue) Hives, Nausea Only   Dye Fdc Blue [brilliant Blue Fcf (fd&c Blue #1)] Hives, Nausea Only        Medication List        Accurate as of July 07, 2024  10:41 AM. If you have any questions, ask your nurse or doctor.          acetaminophen 500 MG tablet Commonly known as: TYLENOL Take 500 mg by mouth every 6 (six) hours as needed.   acetaminophen 650 MG CR tablet Commonly known as: TYLENOL Take 650 mg by mouth every 8 (eight) hours as needed for pain.   amLODipine 10 MG tablet Commonly known as: NORVASC TAKE 1 TABLET BY MOUTH DAILY   amoxicillin 500 MG capsule Commonly known as: AMOXIL Before dental procedures   aspirin EC 81 MG tablet Take 81 mg by mouth daily.   celecoxib 200 MG capsule Commonly known as: CELEBREX Take 200 mg by mouth daily as needed.   exemestane  25 MG tablet Commonly known as: AROMASIN  Take 1 tablet (25 mg total) by mouth daily.   hydrochlorothiazide 25 MG tablet Commonly known as: HYDRODIURIL Take 25 mg by mouth daily.   IBUPROFEN PO Take 400 mg by mouth 2 (two) times daily as needed.   metoprolol tartrate 100 MG tablet Commonly known as: LOPRESSOR Take 100 mg by mouth daily.   psyllium 95 % Pack Commonly known as: HYDROCIL/METAMUCIL Take 1 packet by mouth daily.   simvastatin 20 MG tablet Commonly known as: ZOCOR Take 20 mg by mouth daily.   valsartan 160 MG tablet Commonly known as: DIOVAN Take 160 mg  by mouth daily.   Vitamin D  (Ergocalciferol ) 1.25 MG (50000 UNIT) Caps capsule Commonly known as: DRISDOL  TAKE 1 CAPSULE BY MOUTH ONE TIME PER WEEK        Allergies:  Allergies  Allergen Reactions   Contrast Media [Iodinated Contrast Media] Hives, Shortness Of Breath and Nausea And Vomiting    Allergic reaction even after pre-meds.   Metrizamide Hives, Shortness Of Breath and Nausea And Vomiting    Allergic reaction even after pre-meds.   Other Shortness Of Breath, Nausea And Vomiting and Rash    Allergic reaction even after pre-meds.   Blue Dye #1 (Brilliant Blue) Hives and Nausea Only   Dye Fdc Blue [Brilliant Blue Fcf (Fd&C Blue #1)] Hives and Nausea Only    Past  Medical History, Surgical history, Social history, and Family History were reviewed and updated.  Review of Systems: All other 10 point review of systems is negative.   Physical Exam:  height is 5' 4 (1.626 m) and weight is 178 lb 1.3 oz (80.8 kg). Her oral temperature is 98.4 F (36.9 C). Her blood pressure is 136/51 (abnormal) and her pulse is 67. Her respiration is 18 and oxygen saturation is 100%.   Wt Readings from Last 3 Encounters:  07/07/24 178 lb 1.3 oz (80.8 kg)  05/04/24 181 lb 1.9 oz (82.2 kg)  03/22/24 186 lb 0.6 oz (84.4 kg)    Ocular: Sclerae unicteric, pupils equal, round and reactive to light Ear-nose-throat: Oropharynx clear, dentition fair Lymphatic: No cervical, supraclavicular or axillary adenopathy Lungs no rales or rhonchi, good excursion bilaterally Heart regular rate and rhythm, no murmur appreciated Abd soft, nontender, positive bowel sounds MSK no focal spinal tenderness, no joint edema Neuro: non-focal, well-oriented, appropriate affect Lab Results  Component Value Date   WBC 7.4 07/07/2024   HGB 10.8 (L) 07/07/2024   HCT 34.3 (L) 07/07/2024   MCV 92.7 07/07/2024   PLT 201 07/07/2024   Lab Results  Component Value Date   FERRITIN 567 (H) 05/04/2024   IRON 55 05/04/2024   TIBC 253 05/04/2024   UIBC 198 05/04/2024   IRONPCTSAT 22 05/04/2024   Lab Results  Component Value Date   RETICCTPCT 1.4 07/07/2024   RBC 3.72 (L) 07/07/2024   RBC 3.70 (L) 07/07/2024   RETICCTABS 55.2 06/20/2011   No results found for: KPAFRELGTCHN, LAMBDASER, KAPLAMBRATIO No results found for: KIMBERLY LE [facility] Lab Results  Component Value Date   TOTALPROTELP 6.6 08/14/2010   ALBUMINELP 53.8 (L) 08/14/2010   A1GS 5.3 (H) 08/14/2010   A2GS 14.3 (H) 08/14/2010   BETS 5.8 08/14/2010   BETA2SER 6.3 08/14/2010   GAMS 14.5 08/14/2010   MSPIKE NOT DET 08/14/2010   SPEI * 08/14/2010     Chemistry      Component Value Date/Time   NA 144 07/07/2024  0956   NA 146 (H) 07/23/2017 0900   NA 144 02/18/2017 0901   K 3.9 07/07/2024 0956   K 3.7 07/23/2017 0900   K 3.7 02/18/2017 0901   CL 106 07/07/2024 0956   CL 107 07/23/2017 0900   CO2 27 07/07/2024 0956   CO2 31 07/23/2017 0900   CO2 27 02/18/2017 0901   BUN 17 07/07/2024 0956   BUN 16 07/23/2017 0900   BUN 17.7 02/18/2017 0901   CREATININE 0.78 07/07/2024 0956   CREATININE 0.8 07/23/2017 0900   CREATININE 0.7 02/18/2017 0901      Component Value Date/Time   CALCIUM 9.5 07/07/2024 0956   CALCIUM  9.3 07/23/2017 0900   CALCIUM 9.3 02/18/2017 0901   ALKPHOS 64 07/07/2024 0956   ALKPHOS 67 07/23/2017 0900   ALKPHOS 78 02/18/2017 0901   AST 19 07/07/2024 0956   AST 13 02/18/2017 0901   ALT 14 07/07/2024 0956   ALT 20 07/23/2017 0900   ALT 12 02/18/2017 0901   BILITOT 0.5 07/07/2024 0956   BILITOT 0.54 02/18/2017 0901      Encounter Diagnoses  Name Primary?   Iron deficiency anemia secondary to inadequate dietary iron intake Yes   Erythropoietin  deficiency anemia    Malignant neoplasm of nipple of right breast in female, unspecified estrogen receptor status (HCC)    Iron deficiency anemia due to chronic blood loss     Impression and Plan: Ms. Sigmon is a very pleasant 73 yo caucasian female with history of locally recurrent adenocarcinoma of the right breast with resection and radiation. Also seen and followed for IDA and erythropoietin  deficiency.   PET scan on 12/11/2023 non-concerning for recurrence of disease  DEXA was on 08/07/2022 with a Tscore of -1.0 Last mammogram of left breast was on 09/24/2023- BI-RADS 2 She has an appointment in December with GI which I have encouraged her to take.  Iron studies are pending. Will replace if needed ESA today, Hgb 10.8 Continue same regimen with Aromasin . She is tolerating nicely.   Continue with wound care follow up  Disposition Aranesp  today LDH is up slightly- will follow closely and consider additional scans if it  continues to trend upwards. Fortunately CA 27.29.  RTC 8 weeks APP, labs (CBC w/, CMP, LDH, CA 27.29, iron, ferritin, retic), ESA  Lauraine CHRISTELLA Dais, PA-C 9/25/202510:41 AM

## 2024-07-08 LAB — CANCER ANTIGEN 27.29: CA 27.29: 18.9 U/mL (ref 0.0–38.6)

## 2024-07-12 ENCOUNTER — Ambulatory Visit: Payer: Self-pay | Admitting: Medical Oncology

## 2024-07-12 NOTE — Telephone Encounter (Signed)
-----   Message from Lauraine CHRISTELLA Dais sent at 07/12/2024  2:06 PM EDT ----- [name] your CA 27.29 marker went down! Iron saturation saturation is good Ferritin remains falsely elevated  ----- Message ----- From: Interface, Lab In [place] Sent: 07/07/2024  10:02 AM EDT To: Lauraine CHRISTELLA Dais, PA-C

## 2024-07-12 NOTE — Telephone Encounter (Signed)
 Call placed to patient to review lab results as outlined by Lauraine Dais, PA-C. Patient verbalized understanding and appreciation for the call. Patient advised to reach out should needs arise.

## 2024-07-26 DIAGNOSIS — T8189XS Other complications of procedures, not elsewhere classified, sequela: Secondary | ICD-10-CM | POA: Diagnosis not present

## 2024-07-26 DIAGNOSIS — T8189XA Other complications of procedures, not elsewhere classified, initial encounter: Secondary | ICD-10-CM | POA: Diagnosis not present

## 2024-08-02 DIAGNOSIS — T8189XA Other complications of procedures, not elsewhere classified, initial encounter: Secondary | ICD-10-CM | POA: Diagnosis not present

## 2024-08-02 DIAGNOSIS — T8189XS Other complications of procedures, not elsewhere classified, sequela: Secondary | ICD-10-CM | POA: Diagnosis not present

## 2024-08-09 DIAGNOSIS — L602 Onychogryphosis: Secondary | ICD-10-CM | POA: Diagnosis not present

## 2024-08-09 DIAGNOSIS — T8189XA Other complications of procedures, not elsewhere classified, initial encounter: Secondary | ICD-10-CM | POA: Diagnosis not present

## 2024-08-09 DIAGNOSIS — T8189XS Other complications of procedures, not elsewhere classified, sequela: Secondary | ICD-10-CM | POA: Diagnosis not present

## 2024-08-16 DIAGNOSIS — T8189XA Other complications of procedures, not elsewhere classified, initial encounter: Secondary | ICD-10-CM | POA: Diagnosis not present

## 2024-08-16 DIAGNOSIS — T8189XS Other complications of procedures, not elsewhere classified, sequela: Secondary | ICD-10-CM | POA: Diagnosis not present

## 2024-08-23 DIAGNOSIS — T8189XA Other complications of procedures, not elsewhere classified, initial encounter: Secondary | ICD-10-CM | POA: Diagnosis not present

## 2024-08-23 DIAGNOSIS — T8189XS Other complications of procedures, not elsewhere classified, sequela: Secondary | ICD-10-CM | POA: Diagnosis not present

## 2024-09-01 ENCOUNTER — Ambulatory Visit: Admitting: Medical Oncology

## 2024-09-01 ENCOUNTER — Ambulatory Visit

## 2024-09-01 ENCOUNTER — Inpatient Hospital Stay

## 2024-09-01 DIAGNOSIS — T8189XA Other complications of procedures, not elsewhere classified, initial encounter: Secondary | ICD-10-CM | POA: Diagnosis not present

## 2024-09-01 DIAGNOSIS — T8189XS Other complications of procedures, not elsewhere classified, sequela: Secondary | ICD-10-CM | POA: Diagnosis not present

## 2024-09-05 ENCOUNTER — Telehealth: Payer: Self-pay | Admitting: *Deleted

## 2024-09-05 ENCOUNTER — Inpatient Hospital Stay

## 2024-09-05 ENCOUNTER — Inpatient Hospital Stay: Attending: Hematology & Oncology

## 2024-09-05 ENCOUNTER — Inpatient Hospital Stay: Admitting: Medical Oncology

## 2024-09-05 VITALS — BP 147/62 | HR 75 | Temp 98.0°F | Resp 16 | Wt 185.0 lb

## 2024-09-05 DIAGNOSIS — C50011 Malignant neoplasm of nipple and areola, right female breast: Secondary | ICD-10-CM

## 2024-09-05 DIAGNOSIS — C50911 Malignant neoplasm of unspecified site of right female breast: Secondary | ICD-10-CM | POA: Insufficient documentation

## 2024-09-05 DIAGNOSIS — I739 Peripheral vascular disease, unspecified: Secondary | ICD-10-CM | POA: Insufficient documentation

## 2024-09-05 DIAGNOSIS — D508 Other iron deficiency anemias: Secondary | ICD-10-CM | POA: Diagnosis not present

## 2024-09-05 DIAGNOSIS — E611 Iron deficiency: Secondary | ICD-10-CM | POA: Diagnosis not present

## 2024-09-05 DIAGNOSIS — R609 Edema, unspecified: Secondary | ICD-10-CM | POA: Diagnosis not present

## 2024-09-05 DIAGNOSIS — N189 Chronic kidney disease, unspecified: Secondary | ICD-10-CM | POA: Insufficient documentation

## 2024-09-05 DIAGNOSIS — Z7982 Long term (current) use of aspirin: Secondary | ICD-10-CM | POA: Diagnosis not present

## 2024-09-05 DIAGNOSIS — Z79811 Long term (current) use of aromatase inhibitors: Secondary | ICD-10-CM | POA: Diagnosis not present

## 2024-09-05 DIAGNOSIS — Z86718 Personal history of other venous thrombosis and embolism: Secondary | ICD-10-CM | POA: Diagnosis not present

## 2024-09-05 DIAGNOSIS — D500 Iron deficiency anemia secondary to blood loss (chronic): Secondary | ICD-10-CM

## 2024-09-05 DIAGNOSIS — D631 Anemia in chronic kidney disease: Secondary | ICD-10-CM | POA: Insufficient documentation

## 2024-09-05 DIAGNOSIS — Z923 Personal history of irradiation: Secondary | ICD-10-CM | POA: Insufficient documentation

## 2024-09-05 LAB — CBC WITH DIFFERENTIAL (CANCER CENTER ONLY)
Abs Immature Granulocytes: 0.01 K/uL (ref 0.00–0.07)
Basophils Absolute: 0 K/uL (ref 0.0–0.1)
Basophils Relative: 1 %
Eosinophils Absolute: 0.2 K/uL (ref 0.0–0.5)
Eosinophils Relative: 4 %
HCT: 37.7 % (ref 36.0–46.0)
Hemoglobin: 11.8 g/dL — ABNORMAL LOW (ref 12.0–15.0)
Immature Granulocytes: 0 %
Lymphocytes Relative: 26 %
Lymphs Abs: 1.6 K/uL (ref 0.7–4.0)
MCH: 28.1 pg (ref 26.0–34.0)
MCHC: 31.3 g/dL (ref 30.0–36.0)
MCV: 89.8 fL (ref 80.0–100.0)
Monocytes Absolute: 0.4 K/uL (ref 0.1–1.0)
Monocytes Relative: 6 %
Neutro Abs: 3.9 K/uL (ref 1.7–7.7)
Neutrophils Relative %: 63 %
Platelet Count: 206 K/uL (ref 150–400)
RBC: 4.2 MIL/uL (ref 3.87–5.11)
RDW: 15 % (ref 11.5–15.5)
WBC Count: 6.1 K/uL (ref 4.0–10.5)
nRBC: 0 % (ref 0.0–0.2)

## 2024-09-05 LAB — CMP (CANCER CENTER ONLY)
ALT: 14 U/L (ref 0–44)
AST: 20 U/L (ref 15–41)
Albumin: 4.3 g/dL (ref 3.5–5.0)
Alkaline Phosphatase: 66 U/L (ref 38–126)
Anion gap: 13 (ref 5–15)
BUN: 13 mg/dL (ref 8–23)
CO2: 26 mmol/L (ref 22–32)
Calcium: 9.7 mg/dL (ref 8.9–10.3)
Chloride: 106 mmol/L (ref 98–111)
Creatinine: 0.64 mg/dL (ref 0.44–1.00)
GFR, Estimated: >60 mL/min (ref >60)
Glucose, Bld: 107 mg/dL — ABNORMAL HIGH (ref 70–99)
Potassium: 3.9 mmol/L (ref 3.5–5.1)
Sodium: 144 mmol/L (ref 135–145)
Total Bilirubin: 0.4 mg/dL (ref 0.0–1.2)
Total Protein: 8.1 g/dL (ref 6.5–8.1)

## 2024-09-05 LAB — FERRITIN: Ferritin: 789 ng/mL — ABNORMAL HIGH (ref 11–307)

## 2024-09-05 LAB — LACTATE DEHYDROGENASE: LDH: 218 U/L (ref 105–235)

## 2024-09-05 LAB — RETIC PANEL
Immature Retic Fract: 7.9 % (ref 2.3–15.9)
RBC.: 4.19 MIL/uL (ref 3.87–5.11)
Retic Count, Absolute: 41.1 K/uL (ref 19.0–186.0)
Retic Ct Pct: 1 % (ref 0.4–3.1)
Reticulocyte Hemoglobin: 33.3 pg (ref >27.9)

## 2024-09-05 LAB — IRON AND IRON BINDING CAPACITY (CC-WL,HP ONLY)
Iron: 45 ug/dL (ref 28–170)
Saturation Ratios: 20 % (ref 10.4–31.8)
TIBC: 224 ug/dL — ABNORMAL LOW (ref 250–450)
UIBC: 179 ug/dL

## 2024-09-05 NOTE — Telephone Encounter (Signed)
 Called patient and left a message that she doesn't need an injection today because her HGB is 11.8. Appointment out. Instructed her to call if she had any questions or concerns.

## 2024-09-05 NOTE — Progress Notes (Signed)
 Hematology and Oncology Follow Up Visit  [name] [id number] Mar 04, 1951 73 y.o. 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 daily - restarted on 09/23/2022 Aspirin 81 mg p.o. daily IV iron as needed Aranesp  300 mcg sq for Hgb less than 11    Interim History:  Ms. [name] is here today for follow-up  Today she states that she has been well. She continues to be followed by the wound clinic in [place]. 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   She is still taking care of her husband.   No breast concerns or changes noted by patient.  No mass, lesion or rash noted.  No adenopathy noted on exam.  She has chronic edema in her lower extremities she states due to PVD. No falls or syncope reported.  Appetite and hydration are good.  No fever, chills, n/v, cough, rash, dizziness, SOB, chest [name], palpitations  No blood loss noted. No bruising or petechiae.  Wt Readings from Last 3 Encounters:  09/05/24 185 lb (83.9 kg)  07/07/24 178 lb 1.3 oz (80.8 kg)  05/04/24 181 lb 1.9 oz (82.2 kg)   ECOG Performance Status: 1 - Symptomatic but completely ambulatory  Medications:  Allergies as of 09/05/2024       Reactions   Contrast Media [iodinated Contrast Media] Hives, Shortness Of Breath, Nausea And Vomiting   Allergic reaction even after pre-meds.   Metrizamide Hives, Shortness Of Breath, Nausea And Vomiting   Allergic reaction even after pre-meds.   Other Shortness Of Breath, Nausea And Vomiting, Rash   Allergic reaction even after pre-meds.   Blue Dye #1 (brilliant Blue) Hives, Nausea Only   Dye Fdc Blue [brilliant Blue Fcf (fd&c Blue #1)] Hives, Nausea Only        Medication List        Accurate as of September 05, 2024 10:16 AM. If you have any  questions, ask your nurse or doctor.          acetaminophen 500 MG tablet Commonly known as: TYLENOL Take 500 mg by mouth every 6 (six) hours as needed.   acetaminophen 650 MG CR tablet Commonly known as: TYLENOL Take 650 mg by mouth every 8 (eight) hours as needed for [name].   amLODipine 10 MG tablet Commonly known as: NORVASC TAKE 1 TABLET BY MOUTH DAILY   amoxicillin 500 MG capsule Commonly known as: AMOXIL Before dental procedures   aspirin EC 81 MG tablet Take 81 mg by mouth daily.   celecoxib 200 MG capsule Commonly known as: CELEBREX Take 200 mg by mouth daily as needed.   exemestane  25 MG tablet Commonly known as: AROMASIN  Take 1 tablet (25 mg total) by mouth daily.   hydrochlorothiazide 25 MG tablet Commonly known as: HYDRODIURIL Take 25 mg by mouth daily.   IBUPROFEN PO Take 400 mg by mouth 2 (two) times daily as needed.   metoprolol tartrate 100 MG tablet Commonly known as: LOPRESSOR Take 100 mg by mouth daily.   psyllium 95 % Pack Commonly known as: HYDROCIL/METAMUCIL Take 1 packet by mouth daily.   simvastatin 20 MG tablet Commonly known as: ZOCOR Take 20 mg by mouth daily.   valsartan 160 MG tablet Commonly known as: DIOVAN Take 160 mg by mouth daily.   Vitamin  D (Ergocalciferol ) 1.25 MG (50000 UNIT) Caps capsule Commonly known as: DRISDOL  TAKE 1 CAPSULE BY MOUTH ONE TIME PER WEEK        Allergies:  Allergies  Allergen Reactions   Contrast Media [Iodinated Contrast Media] Hives, Shortness Of Breath and Nausea And Vomiting    Allergic reaction even after pre-meds.   Metrizamide Hives, Shortness Of Breath and Nausea And Vomiting    Allergic reaction even after pre-meds.   Other Shortness Of Breath, Nausea And Vomiting and Rash    Allergic reaction even after pre-meds.   Blue Dye #1 (Brilliant Blue) Hives and Nausea Only   Dye Fdc Blue [Brilliant Blue Fcf (Fd&C Blue #1)] Hives and Nausea Only    Past Medical History, Surgical  history, Social history, and Family History were reviewed and updated.  Review of Systems: All other 10 point review of systems is negative.   Physical Exam:  weight is 185 lb (83.9 kg). Her temperature is 98 F (36.7 C). Her blood pressure is 147/62 (abnormal) and her pulse is 75. Her respiration is 16 and oxygen saturation is 100%.   Wt Readings from Last 3 Encounters:  09/05/24 185 lb (83.9 kg)  07/07/24 178 lb 1.3 oz (80.8 kg)  05/04/24 181 lb 1.9 oz (82.2 kg)    Ocular: Sclerae unicteric, pupils equal, round and reactive to light Ear-nose-throat: Oropharynx clear, dentition fair Lymphatic: No cervical, supraclavicular or axillary adenopathy Lungs no rales or rhonchi, good excursion bilaterally Heart regular rate and rhythm, no murmur appreciated Abd soft, nontender, positive bowel sounds MSK no focal spinal tenderness, no joint edema Neuro: non-focal, well-oriented, appropriate affect Lab Results  Component Value Date   WBC 6.1 09/05/2024   HGB 11.8 (L) 09/05/2024   HCT 37.7 09/05/2024   MCV 89.8 09/05/2024   PLT 206 09/05/2024   Lab Results  Component Value Date   FERRITIN 827 (H) 07/07/2024   IRON 53 07/07/2024   TIBC 252 07/07/2024   UIBC 199 07/07/2024   IRONPCTSAT 21 07/07/2024   Lab Results  Component Value Date   RETICCTPCT 1.4 07/07/2024   RBC 4.20 09/05/2024   RETICCTABS 55.2 06/20/2011   No results found for: KPAFRELGTCHN, LAMBDASER, KAPLAMBRATIO No results found for: KIMBERLY LE [facility] Lab Results  Component Value Date   TOTALPROTELP 6.6 08/14/2010   ALBUMINELP 53.8 (L) 08/14/2010   A1GS 5.3 (H) 08/14/2010   A2GS 14.3 (H) 08/14/2010   BETS 5.8 08/14/2010   BETA2SER 6.3 08/14/2010   GAMS 14.5 08/14/2010   MSPIKE NOT DET 08/14/2010   SPEI * 08/14/2010     Chemistry      Component Value Date/Time   NA 144 07/07/2024 0956   NA 146 (H) 07/23/2017 0900   NA 144 02/18/2017 0901   K 3.9 07/07/2024 0956   K 3.7 07/23/2017 0900    K 3.7 02/18/2017 0901   CL 106 07/07/2024 0956   CL 107 07/23/2017 0900   CO2 27 07/07/2024 0956   CO2 31 07/23/2017 0900   CO2 27 02/18/2017 0901   BUN 17 07/07/2024 0956   BUN 16 07/23/2017 0900   BUN 17.7 02/18/2017 0901   CREATININE 0.78 07/07/2024 0956   CREATININE 0.8 07/23/2017 0900   CREATININE 0.7 02/18/2017 0901      Component Value Date/Time   CALCIUM 9.5 07/07/2024 0956   CALCIUM 9.3 07/23/2017 0900   CALCIUM 9.3 02/18/2017 0901   ALKPHOS 64 07/07/2024 0956   ALKPHOS 67 07/23/2017 0900   ALKPHOS 78 02/18/2017  0901   AST 19 07/07/2024 0956   AST 13 02/18/2017 0901   ALT 14 07/07/2024 0956   ALT 20 07/23/2017 0900   ALT 12 02/18/2017 0901   BILITOT 0.5 07/07/2024 0956   BILITOT 0.54 02/18/2017 0901      No diagnosis found.   Impression and Plan: Ms. Galbraith is a very pleasant 73 yo caucasian female with history of locally recurrent adenocarcinoma of the right breast with resection and radiation. Also seen and followed for IDA and erythropoietin  deficiency.   PET scan on 12/11/2023 non-concerning for recurrence of disease  DEXA was on 08/07/2022 with a Tscore of -1.0 Last mammogram of left breast was on 09/24/2023- BI-RADS 2 She has an appointment in December with GI which I have again encouraged her to take.  Iron studies are pending. Will replace if needed ESA held today, Hgb 11.8 Continue same regimen with Aromasin . She is tolerating nicely.    Disposition No Aranesp  today RTC 8 weeks APP, labs (CBC w/, CMP, LDH, CA 27.29, iron, ferritin), ESA injection  Lauraine CHRISTELLA Dais, PA-C 11/24/202510:16 AM

## 2024-09-06 LAB — CANCER ANTIGEN 27.29: CA 27.29: 26.5 U/mL (ref 0.0–38.6)

## 2024-09-07 ENCOUNTER — Ambulatory Visit: Payer: Self-pay | Admitting: Medical Oncology

## 2024-09-07 DIAGNOSIS — T8189XA Other complications of procedures, not elsewhere classified, initial encounter: Secondary | ICD-10-CM | POA: Diagnosis not present

## 2024-09-07 DIAGNOSIS — T8189XS Other complications of procedures, not elsewhere classified, sequela: Secondary | ICD-10-CM | POA: Diagnosis not present

## 2024-09-07 NOTE — Telephone Encounter (Signed)
 As noted by Lauraine Dais, PA, I informed the patient that she does not need any additional iron at this time. The tumor marker trend is stable - if often bounces around from the teens to twenties- currently it is 26.5. We will continue monitoring this through lab work. She verbalized understanding.

## 2024-09-07 NOTE — Telephone Encounter (Signed)
-----   Message from Lauraine CHRISTELLA Dais sent at 09/07/2024 10:23 AM EST ----- No additional Iron needed at this time Tumor marker trend is stable- it often bounces around from the teens to twenties- currently it is 26.5- we will continue monitoring  ----- Message ----- From: Interface, Lab In [place] Sent: 09/05/2024  10:03 AM EST To: Lauraine CHRISTELLA Dais, PA-C

## 2024-09-13 DIAGNOSIS — T8189XS Other complications of procedures, not elsewhere classified, sequela: Secondary | ICD-10-CM | POA: Diagnosis not present

## 2024-09-19 DIAGNOSIS — T8189XS Other complications of procedures, not elsewhere classified, sequela: Secondary | ICD-10-CM | POA: Diagnosis not present

## 2024-09-26 DIAGNOSIS — E782 Mixed hyperlipidemia: Secondary | ICD-10-CM | POA: Diagnosis not present

## 2024-09-26 DIAGNOSIS — R7303 Prediabetes: Secondary | ICD-10-CM | POA: Diagnosis not present

## 2024-09-27 LAB — HM MAMMOGRAPHY

## 2024-10-05 ENCOUNTER — Ambulatory Visit

## 2024-10-05 VITALS — BP 157/89 | HR 86 | Temp 97.6°F | Ht 63.0 in | Wt 180.0 lb

## 2024-10-05 DIAGNOSIS — L598 Other specified disorders of the skin and subcutaneous tissue related to radiation: Secondary | ICD-10-CM

## 2024-10-05 DIAGNOSIS — C50011 Malignant neoplasm of nipple and areola, right female breast: Secondary | ICD-10-CM

## 2024-10-05 DIAGNOSIS — Z923 Personal history of irradiation: Secondary | ICD-10-CM

## 2024-10-05 DIAGNOSIS — S21101A Unspecified open wound of right front wall of thorax without penetration into thoracic cavity, initial encounter: Secondary | ICD-10-CM | POA: Diagnosis not present

## 2024-10-05 DIAGNOSIS — Z86718 Personal history of other venous thrombosis and embolism: Secondary | ICD-10-CM

## 2024-10-05 DIAGNOSIS — T66XXXA Radiation sickness, unspecified, initial encounter: Secondary | ICD-10-CM

## 2024-10-05 DIAGNOSIS — I890 Lymphedema, not elsewhere classified: Secondary | ICD-10-CM

## 2024-10-05 DIAGNOSIS — Z87828 Personal history of other (healed) physical injury and trauma: Secondary | ICD-10-CM

## 2024-10-05 DIAGNOSIS — T66XXXS Radiation sickness, unspecified, sequela: Secondary | ICD-10-CM

## 2024-10-05 HISTORY — DX: Radiation sickness, unspecified, initial encounter: T66.XXXA

## 2024-10-05 NOTE — Progress Notes (Addendum)
 Plastic & Reconstructive Surgery New Patient Visit  Patient: [name] MRN: [id number] Date: 10/05/2024 Surgical Oncologist: Medical Oncologist:  Reason for Consult: Breast Reconstruction/chronic breast wound related to radiation therapy  History of Present Illness:  This is a 73 y.o. woman who presents in consultation for breast reconstruction/management of right chronic chest wound related to radiation therapy.  She has a history of right breast cancer (adenocarcinoma) status post mastectomy with chemotherapy in 1998.  She had recurrences in 2002 and 2012 per patient she received radiation therapy on those 2 occasions.  She has developed a chronic wound on her chest that has not improved with local wound care.  She has seen a wound care clinic specialist in [place] and was referred to me for possible breast reconstruction/chronic breast wound treatment, she currently wears a breast prosthesis.She is still taking care of her husband.  She comes accompanied by her daughter.  Has a history of lymphedema of the right arm.  Does not bother her and is not painful.  She is not interested in addressing the left breast at this point.  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   Hyperlipidemia    Hypertension    Leg swelling     Past Surgical History: Past Surgical History:  Procedure Laterality Date   BREAST SURGERY  10/13/1996   chest wall exploratory  07/01/2011   CHOLECYSTECTOMY     HIP ARTHROPLASTY     JOINT REPLACEMENT  04/04/2011   hip replacement    MASTECTOMY Right 2012   TUBAL LIGATION      Current Medications: Current  Outpatient Medications  Medication Instructions   acetaminophen (TYLENOL) 500 mg, Every 6 hours PRN   acetaminophen (TYLENOL) 650 mg, Every 8 hours PRN   amLODipine (NORVASC) 10 mg, Oral, Daily   amoxicillin (AMOXIL) 500 MG capsule Before dental procedures   aspirin EC 81 mg, Daily   celecoxib (CELEBREX) 200 mg, Daily PRN   exemestane  (AROMASIN ) 25 mg, Oral, Daily   hydrochlorothiazide (HYDRODIURIL) 25 mg, Daily   IBUPROFEN PO 400 mg, 2 times daily PRN   metoprolol tartrate (LOPRESSOR) 100 mg, Daily   psyllium (HYDROCIL/METAMUCIL) 95 % PACK 1 packet, Daily   simvastatin (ZOCOR) 20 mg, Daily   valsartan (DIOVAN) 160 mg, Daily   Vitamin D , Ergocalciferol , (DRISDOL ) 1.25 MG (50000 UNIT) CAPS capsule TAKE 1 CAPSULE BY MOUTH ONE TIME PER WEEK   Allergies: Allergies[1]  Family History:  No family history is negative for bleeding/clotting disorders, problems with anesthesia, connective tissue disorders.   Social History:  Social History   Socioeconomic History   Marital status: Married    Spouse name: Not on file   Number of children: Not on file   Years of education: Not on file   Highest education level: Not on file  Occupational History   Not on file  Tobacco Use   Smoking status: Never    Passive exposure: Past   Smokeless tobacco: Never   Tobacco comments:    never used tobacco  Vaping Use   Vaping status: Never Used  Substance and Sexual Activity  Alcohol  use: No    Alcohol /week: 0.0 standard drinks of alcohol    Drug use: No   Sexual activity: Not Currently  Other Topics Concern   Not on file  Social History Narrative   Not on file   Social Drivers of Health   Tobacco Use: Low Risk (05/05/2024)   Received from [facility]   Patient History    Smoking Tobacco Use: Never    Smokeless Tobacco Use: Never    Passive Exposure: Not on file  Financial Resource Strain: Not on file  Food Insecurity: Low Risk (05/05/2024)   Received from [facility]   Epic     Within the past 12 months, you worried that your food would run out before you got money to buy more: Never true    Within the past 12 months, the food you bought just didn't last and you didn't have money to get more. : Never true  Transportation Needs: No Transportation Needs (05/05/2024)   Received from [facility]    In the past 12 months, has lack of reliable transportation kept you from medical appointments, meetings, work or from getting things needed for daily living? : No  Physical Activity: Not on file  Stress: Not on file  Social Connections: Not on file  Depression (PHQ2-9): Low Risk (09/05/2024)   Depression (PHQ2-9)    PHQ-2 Score: 0  Alcohol  Screen: Not on file  Housing: Low Risk (05/05/2024)   Received from [facility]   Epic    What is your living situation today?: I have a steady place to live    Think about the place you live. Do you have problems with any of the following? Choose all that apply:: None/None on this list  Utilities: Low Risk (05/05/2024)   Received from [facility]   Utilities    In the past 12 months has the electric, gas, oil, or water company threatened to shut off services in your home? : No  Health Literacy: Not on file   She is a never smoker. Denies recreational drug use.   Review of systems: 10 point review of systems performed and negative except as noted in the HPI.  Physical Exam: BP (!) 157/89 (BP Location: Left Arm, Patient Position: Sitting, Cuff Size: Normal)   Pulse 86   Temp 97.6 F (36.4 C) (Oral)   Ht 5' 3 (1.6 m)   Wt 180 lb (81.6 kg)   SpO2 97%   BMI 31.89 kg/m  Body mass index is 31.89 kg/m. General: Well appearing, no apparent distress. Pulm: Breathing comfortably on room air without sounds/wheezing. CV: Good perfusion of extremities. Chest: Chest wall without abnormality or obvious deformity or asymmetry.  Breast: Left grade 3 ptosis. No palpable masses in left breast. NAC viable and sensate.  Skin quality is poor.  On the right chest she has a chronic wound that goes down to chest wall measuring 5 cm x 3 cm x 1 cm.  Chronic radiation injury to the surrounding skin noticed.  Necrotic tissue noticed in the wound edges and at the wound base. Neuro: Moving all four extremities spontaneously.  Psych: Appropriate mood and affect.   Labs: Recent Results (from the past 2160 hours)  CBC with Differential (Cancer Center Only)     Status: Abnormal   Collection Time: 09/05/24  8:47 AM  Result Value Ref Range   WBC Count 6.1 4.0 - 10.5 K/uL   RBC 4.20 3.87 - 5.11 MIL/uL   Hemoglobin  11.8 (L) 12.0 - 15.0 g/dL   HCT 62.2 63.9 - 53.9 %   MCV 89.8 80.0 - 100.0 fL   MCH 28.1 26.0 - 34.0 pg   MCHC 31.3 30.0 - 36.0 g/dL   RDW 84.9 88.4 - 84.4 %   Platelet Count 206 150 - 400 K/uL   nRBC 0.0 0.0 - 0.2 %   Neutrophils Relative % 63 %   Neutro Abs 3.9 1.7 - 7.7 K/uL   Lymphocytes Relative 26 %   Lymphs Abs 1.6 0.7 - 4.0 K/uL   Monocytes Relative 6 %   Monocytes Absolute 0.4 0.1 - 1.0 K/uL   Eosinophils Relative 4 %   Eosinophils Absolute 0.2 0.0 - 0.5 K/uL   Basophils Relative 1 %   Basophils Absolute 0.0 0.0 - 0.1 K/uL   Immature Granulocytes 0 %   Abs Immature Granulocytes 0.01 0.00 - 0.07 K/uL    Comment: Performed at [facility], [address] [place], [place] [address]  CA 27.29     Status: None   Collection Time: 09/05/24  8:47 AM  Result Value Ref Range   CA 27.29 26.5 0.0 - 38.6 U/mL    Comment: (NOTE) Siemens Centaur Immunochemiluminometric Methodology (ICMA) Values obtained with different assay methods or kits cannot be used interchangeably. Results cannot be interpreted as absolute evidence of the presence or absence of malignant disease. Performed At: [facility] [address] [place], [place] [id number] Jennette Shorter MD Ey:[id number]   Iron and Iron Binding Capacity ([facility] only)     Status: Abnormal   Collection Time: 09/05/24  8:47 AM   Result Value Ref Range   Iron 45 28 - 170 ug/dL   TIBC 775 (L) 749 - 549 ug/dL   Saturation Ratios 20 10.4 - 31.8 %   UIBC 179 ug/dL    Comment: Performed at [facility], [address], [place], [place] [address]  Ferritin     Status: Abnormal   Collection Time: 09/05/24  8:48 AM  Result Value Ref Range   Ferritin 789 (H) 11 - 307 ng/mL    Comment: Performed at [facility], [address] [place], [place] [address]  CMP (Cancer Center only)     Status: Abnormal   Collection Time: 09/05/24  8:48 AM  Result Value Ref Range   Sodium 144 135 - 145 mmol/L   Potassium 3.9 3.5 - 5.1 mmol/L   Chloride 106 98 - 111 mmol/L   CO2 26 22 - 32 mmol/L   Glucose, Bld 107 (H) 70 - 99 mg/dL    Comment: Glucose reference range applies only to samples taken after fasting for at least 8 hours.   BUN 13 8 - 23 mg/dL   Creatinine 9.35 9.55 - 1.00 mg/dL   Calcium 9.7 8.9 - 89.6 mg/dL   Total Protein 8.1 6.5 - 8.1 g/dL   Albumin 4.3 3.5 - 5.0 g/dL   AST 20 15 - 41 U/L   ALT 14 0 - 44 U/L   Alkaline Phosphatase 66 38 - 126 U/L   Total Bilirubin 0.4 0.0 - 1.2 mg/dL   GFR, Estimated >39 >39 mL/min    Comment: (NOTE) Calculated using the CKD-EPI Creatinine Equation (2021)    Anion gap 13 5 - 15    Comment: Performed at [facility], [address] [place], [place] [address]  Lactate dehydrogenase     Status: None   Collection Time: 09/05/24  8:48 AM  Result Value Ref Range   LDH 218 105 - 235 U/L    Comment: Please note change in reference range. Performed at [facility], [address] [place], [place] [address]   Retic Panel     Status: None   Collection Time: 09/05/24  8:48 AM  Result Value Ref Range   Retic Ct Pct 1.0 0.4 - 3.1 %   RBC. 4.19 3.87 - 5.11 MIL/uL   Retic Count, Absolute 41.1 19.0 - 186.0 K/uL   Immature Retic Fract 7.9 2.3 - 15.9 %   Reticulocyte Hemoglobin 33.3 >27.9 pg    Comment:        Given the high negative  predictive value of a RET-He result > 32 pg iron deficiency is essentially excluded. If this patient is anemic other etiologies should be considered. Performed at [facility], [address] [place], [place] [address]      Imaging/Pathology:    Procedure: Chest wound debridement  Description of Procedure:  After obtaining verbal consent from the patient the chest wound was identified. The wound measured 5 cm  3 cm  1 cm. Sharp debridement was performed using a knife and scissors. Nonviable skin, dermis, and chest wall tissue were excised. Silver  nitrate was applied for hemostasis. At the conclusion of the procedure, the wound dimensions remained 5 cm  3 cm  1 cm. The patient tolerated the procedure well without complications.     Assessment: In summary, this is a pleasant 73 y.o. year-old woman with PMH and PSH as described above and history of locally recurrent breast cancer treated with surgery, chemotherapy, and radiation therapy, presenting for consultation for right breast reconstruction/management of chronic chest wound. I obtained verbal consent from the patient and proceeded to clean the wound in the office under sterile conditions.  I had a long discussion with the patient regarding her options for breast reconstruction/chronic wound management. All this information discussed is included in a patient education document given to the patient.  I do not think given the radiation injury that this wound will heal with local wound care, skin grafts, skin substitutes, wound VAC therapy.    Given her age I suggested a pedicled flap, such as a latissimus dorsi flap, over a deep inferior epigastric flap, given her age and comorbidities. The Latissimus Dorsi flap (Back Muscle Flap) without an expander was discussed. The patient understands the procedure, postop course. The risk, discussed are similar to the other reconstructions, including bleeding, infection, hematomas,  risks of anesthesia, damage to surrounding structures flap failure, pneumothorax and wound recurrence, except that seroma (fluid build up) risk of 18% was highlighted. This sometimes can require further surgery. Sometimes there is a risk of decreased shoulder mobility and chronic pain is possible but this is usually well tolerated form of reconstruction.   We also discussed the [facility] Health Act of 1998. All the questions were answered. The patient has all the information to make an informed decision and has a good understanding of risks, benefits and limitations.  She has all the prerequisite information to make an informed decision.    In summary, this is a very pleasant 73 y.o. Fwith history of locally recurrent adenocarcinoma of the right breast with resection and adjuvant radiation therapy.  She is currently disease-free per oncologist note and presents with a chronic chest wound related to radiation that has been receiving local wound care but has not shown signs of improvement.  We have agreed reconstruction with latissimus dorsi flap.  I will need to obtain imaging studies first to assess patency of the thoracodorsal vessels given the extensive surgical history.  Patient states that she cannot tolerate CT scan contrast I we will order an MRI with gadolinium to check for patency of this vessel.  Patient states that this needs to be done in [place].  Patient will need PCP clearance and cardiology clearance given comorbidities. Regarding wound care I recommended Vashe wet-to-dry dressings twice daily to keep the wound clean. Patient follow-up will follow-up with me with MRI results before scheduling surgery. The time documented represents the total time spent on the day of the encounter in preparing for and completing the visit. It does not include time spent by ancillary staff, a resident, a fellow, another trainee, or, for shared visits, time spent jointly with the patient or discussing the case or  the performance of other separately performed services.   Time spent: 45 minutes.    Candas Deemer, MD [facility]   10/05/2024 10:05 AM     [1]  Allergies Allergen Reactions   Contrast Media [Iodinated Contrast Media] Hives, Shortness Of Breath and Nausea And Vomiting    Allergic reaction even after pre-meds.   Metrizamide Hives, Shortness Of Breath and Nausea And Vomiting    Allergic reaction even after pre-meds.   Other Shortness Of Breath, Nausea And Vomiting and Rash    Allergic reaction even after pre-meds.   Blue Dye #1 (Brilliant Blue) Hives and Nausea Only   Dye Fdc Blue [Brilliant Blue Fcf (Fd&C Blue #1)] Hives and Nausea Only

## 2024-10-19 ENCOUNTER — Telehealth: Payer: Self-pay

## 2024-10-19 MED ORDER — ALPRAZOLAM 0.5 MG PO TABS: 0.5000 mg | ORAL_TABLET | Freq: Once | ORAL | 0 refills | Status: AC

## 2024-10-19 NOTE — Telephone Encounter (Signed)
 Pt is having an MRI 10-20-24, pt just called and said they you were going to prescribe her something to keep her calm during MRI

## 2024-10-20 ENCOUNTER — Ambulatory Visit (HOSPITAL_BASED_OUTPATIENT_CLINIC_OR_DEPARTMENT_OTHER)
Admission: RE | Admit: 2024-10-20 | Discharge: 2024-10-20 | Disposition: A | Discharge status: Home / Self Care | Source: Ambulatory Visit

## 2024-10-20 DIAGNOSIS — C50011 Malignant neoplasm of nipple and areola, right female breast: Secondary | ICD-10-CM | POA: Diagnosis not present

## 2024-10-20 DIAGNOSIS — I890 Lymphedema, not elsewhere classified: Secondary | ICD-10-CM

## 2024-10-20 MED ORDER — GADOBUTROL 1 MMOL/ML IV SOLN
8.0000 mL | Freq: Once | INTRAVENOUS | Status: AC | PRN
  Administered 2024-10-20: 8 mL via INTRAVENOUS

## 2024-10-20 MED ORDER — GADOBUTROL 1 MMOL/ML IV SOLN: 10.0000 mL | Freq: Once | INTRAVENOUS | Status: DC | PRN

## 2024-10-21 DIAGNOSIS — I82409 Acute embolism and thrombosis of unspecified deep veins of unspecified lower extremity: Secondary | ICD-10-CM | POA: Insufficient documentation

## 2024-10-21 DIAGNOSIS — E785 Hyperlipidemia, unspecified: Secondary | ICD-10-CM | POA: Insufficient documentation

## 2024-10-21 DIAGNOSIS — M1990 Unspecified osteoarthritis, unspecified site: Secondary | ICD-10-CM | POA: Insufficient documentation

## 2024-10-21 DIAGNOSIS — M7989 Other specified soft tissue disorders: Secondary | ICD-10-CM | POA: Insufficient documentation

## 2024-10-25 ENCOUNTER — Ambulatory Visit

## 2024-10-25 VITALS — BP 132/56 | HR 80 | Ht 63.0 in | Wt 179.1 lb

## 2024-10-25 DIAGNOSIS — Z01810 Encounter for preprocedural cardiovascular examination: Secondary | ICD-10-CM | POA: Insufficient documentation

## 2024-10-25 DIAGNOSIS — E785 Hyperlipidemia, unspecified: Secondary | ICD-10-CM | POA: Diagnosis present

## 2024-10-25 DIAGNOSIS — I10 Essential (primary) hypertension: Secondary | ICD-10-CM | POA: Diagnosis present

## 2024-10-25 DIAGNOSIS — R0609 Other forms of dyspnea: Secondary | ICD-10-CM | POA: Diagnosis present

## 2024-10-25 NOTE — Patient Instructions (Signed)
 Medication Instructions:  Your physician recommends that you continue on your current medications as directed. Please refer to the Current Medication list given to you today.  *If you need a refill on your cardiac medications before your next appointment, please call your pharmacy*  Lab Work: None If you have labs (blood work) drawn today and your tests are completely normal, you will receive your results only by: MyChart Message (if you have MyChart) OR A paper copy in the mail If you have any lab test that is abnormal or we need to change your treatment, we will call you to review the results.  Testing/Procedures:   [facility] [address] [place], [place] [address] Phone:  [phone]    Please arrive 15 minutes prior to your appointment time for registration and insurance purposes.  The test will take approximately 3 to 4 hours to complete; you may bring reading material.  If someone comes with you to your appointment, they will need to remain in the main lobby due to limited space in the testing area. **If you are pregnant or breastfeeding, please notify the nuclear lab prior to your appointment**  How to prepare for your Myocardial Perfusion Test: Do not eat or drink 3 hours prior to your test, except you may have water. Do not consume products containing caffeine (regular or decaffeinated) 12 hours prior to your test. (ex: coffee, chocolate, sodas, tea). Do bring a list of your current medications with you.  If not listed below, you may take your medications as normal. Do not take metoprolol (Lopressor, Toprol) for 24 hours prior to the test.  Bring the medication to your appointment as you may be required to take it once the test is complete. Do wear comfortable clothes (no dresses or overalls) and walking shoes, tennis shoes preferred (No heels or open toe shoes are allowed). Do NOT wear cologne, perfume, aftershave, or lotions (deodorant is  allowed). If these instructions are not followed, your test will have to be rescheduled.  Please report to [address] for your test.  If you have questions or concerns about your appointment, you can call the [facility] at [phone].  If you cannot keep your appointment, please provide 24 hours notification to the Nuclear Lab, to avoid a possible $50 charge to your account.  Your physician has requested that you have an echocardiogram. Echocardiography is a painless test that uses sound waves to create images of your heart. It provides your doctor with information about the size and shape of your heart and how well your hearts chambers and valves are working. This procedure takes approximately one hour. There are no restrictions for this procedure. Please do NOT wear cologne, perfume, aftershave, or lotions (deodorant is allowed). Please arrive 15 minutes prior to your appointment time.  Please note: We ask at that you not bring children with you during ultrasound (echo/ vascular) testing. Due to room size and safety concerns, children are not allowed in the ultrasound rooms during exams. Our front office staff cannot provide observation of children in our lobby area while testing is being conducted. An adult accompanying a patient to their appointment will only be allowed in the ultrasound room at the discretion of the ultrasound technician under special circumstances. We apologize for any inconvenience.    Follow-Up: At [facility], you and your health needs are our priority.  As part of our continuing mission to provide you with exceptional  heart care, our providers are all part of one team.  This team includes your primary Cardiologist (physician) and Advanced Practice Providers or APPs (Physician Assistants and Nurse Practitioners) who all work together to provide you with the care you need, when you need it.  Your next appointment:   2  month(s)  Provider:   Alean Kobus, MD    We recommend signing up for the patient portal called MyChart.  Sign up information is provided on this After Visit Summary.  MyChart is used to connect with patients for Virtual Visits (Telemedicine).  Patients are able to view lab/test results, encounter notes, upcoming appointments, etc.  Non-urgent messages can be sent to your provider as well.   To learn more about what you can do with MyChart, go to [email or web address].   Other Instructions None

## 2024-10-25 NOTE — Assessment & Plan Note (Signed)
 No active symptoms of chest pain. However functional status is limited due to shortness of breath gradually progressing over the past 5 to 6 years and limited ambulation.  Unable to assess if she meets greater than 4 METS. Has baseline EKG changes with anteroseptal Q waves and subtle ST segment changes in lateral precordial leads.  Proceed with transthoracic echocardiogram to assess cardiac structure and function. Proceed with Lexiscan stress test with nuclear imaging to rule out any significant ischemia burden.  If no high risk feature should be able to proceed with her elective surgery as being planned.

## 2024-10-25 NOTE — Assessment & Plan Note (Signed)
 Chronic, gradually progressive. Likely related to deconditioning. Cannot exclude cardiovascular causes given her age, risk factors of chest wall radiation therapy in the past.  Proceed with transthoracic echocardiogram and Lexiscan stress test with nuclear imaging as reviewed above under preop cardiovascular exam.

## 2024-10-25 NOTE — Assessment & Plan Note (Signed)
 Lipid panel from 09/26/2024 total cholesterol 161, HDL 55, LDL 89 and triglycerides 92. Continue simvastatin 20 mg once daily.

## 2024-10-25 NOTE — Assessment & Plan Note (Signed)
 Well-controlled. On current treatment with amlodipine 10 mg once daily Hydrochlorothiazide 25 mg once daily Metoprolol succinate 100 mg once daily Valsartan 160 mg once daily. Reviewed her home medication list that she brought to the visit.

## 2024-10-25 NOTE — Progress Notes (Signed)
 "  Cardiology Consultation:    Date:  10/25/2024   ID:  [name], DOB September 28, 1951, MRN [id number]  PCP:  Trinidad Glisson, MD  Cardiologist:  Alean SAUNDERS Zarielle Cea, MD   Referring MD: Montorfano, Lisandro M, MD   No chief complaint on file.    ASSESSMENT AND PLAN:   [name] 74 year old woman with history of breast cancer s/p mastectomy and chemotherapy in 1998, subsequently radiation therapy in 2002 and 2012 with persistent chronic right chest wound and lymphedema of right upper extremity. Also has history of hypertension, hyperlipidemia, rheumatoid arthritis, GERD, chronic venous insufficiency bilateral lower extremities, prior history of DVT in 2013 and was on anticoagulation briefly for 2 to 3 months.  Arthritis of hips and knees. Denies any prior history of CAD, CHF, MI, CVA.  Here for preop cardiovascular risk assessment prior to chest wall nonhealing wound reconstruction surgery by Dr. Montorfano.   Problem List Items Addressed This Visit       Cardiovascular and Mediastinum   Essential hypertension   Well-controlled. On current treatment with amlodipine 10 mg once daily Hydrochlorothiazide 25 mg once daily Metoprolol succinate 100 mg once daily Valsartan 160 mg once daily. Reviewed her home medication list that she brought to the visit.      Relevant Orders   EKG 12-Lead (Completed)   MYOCARDIAL PERFUSION IMAGING   ECHOCARDIOGRAM COMPLETE     Other   Hyperlipidemia   Lipid panel from 09/26/2024 total cholesterol 161, HDL 55, LDL 89 and triglycerides 92. Continue simvastatin 20 mg once daily.      Relevant Orders   EKG 12-Lead (Completed)   MYOCARDIAL PERFUSION IMAGING   ECHOCARDIOGRAM COMPLETE   Preop cardiovascular exam - Primary   No active symptoms of chest pain. However functional status is limited due to shortness of breath gradually progressing over the past 5 to 6 years and limited ambulation.  Unable to assess if she meets greater than 4  METS. Has baseline EKG changes with anteroseptal Q waves and subtle ST segment changes in lateral precordial leads.  Proceed with transthoracic echocardiogram to assess cardiac structure and function. Proceed with Lexiscan stress test with nuclear imaging to rule out any significant ischemia burden.  If no high risk feature should be able to proceed with her elective surgery as being planned.       Relevant Orders   MYOCARDIAL PERFUSION IMAGING   ECHOCARDIOGRAM COMPLETE   Dyspnea on exertion   Chronic, gradually progressive. Likely related to deconditioning. Cannot exclude cardiovascular causes given her age, risk factors of chest wall radiation therapy in the past.  Proceed with transthoracic echocardiogram and Lexiscan stress test with nuclear imaging as reviewed above under preop cardiovascular exam.       Return to clinic tentatively in 2 months.   History of Present Illness:    [name] is a 74 y.o. female who is being seen today for the evaluation of preop cardiovascular risk assessment prior to breast reconstruction surgery at the request of Montorfano, Nieves HERO, MD.   Please here for the visit today by herself.  Lives with her husband at home who himself has significant healthcare issues going on and she takes care of him.  Retired about 5 to 6 years ago.  Uses a cane to ambulate longer distances.  History of breast cancer s/p mastectomy and chemotherapy in 1998, subsequently radiation therapy in 2002 and 2012 with persistent chronic right chest wound and lymphedema of right upper extremity. Also has history of hypertension,  hyperlipidemia, rheumatoid arthritis, GERD, chronic venous insufficiency bilateral lower extremities, prior history of DVT in 2013 and was on anticoagulation briefly for 2 to 3 months.  Osteoarthritis of hips and knees Denies any prior history of CAD, CHF, MI, CVA.  Mentions at baseline at home able to do all daily activities.  However has noticed  gradually decrease in functional capacity with easy tiredness. Denies any chest pain. Does get short of breath walking up an incline. Has bilateral lower extremity edema which has been chronic and uses compression socks.  EKG in clinic today shows sinus rhythm heart rate 80/min, PR interval 200 ms, anteroseptal Q waves.    Past Medical History:  Diagnosis Date   Arthritis    Breast cancer, right breast (HCC) 06/23/2011   Cancer (HCC) 2012   right breast   DVT (deep venous thrombosis) (HCC)    Effect, radiation 10/05/2024   Erythropoietin  deficiency anemia 02/13/2022   Essential hypertension 07/31/2022   History of anxiety 07/31/2022   History of DVT (deep vein thrombosis) 07/31/2022   History of gastroesophageal reflux (GERD) 07/31/2022   History of hyperlipidemia 07/31/2022   Hyperlipidemia    IDA (iron deficiency anemia) 02/01/2018   Leg swelling    Lymphedema of right arm 08/24/2015   Primary osteoarthritis of right knee 07/31/2022   History of chronic pain and discomfort in the knee joint.  History of recent fall. oderate to severe osteoarthritis and severe chondromalacia patella was noted.     Rheumatoid arthritis involving multiple sites with positive rheumatoid factor (HCC) 07/31/2022   Positive RF, positive anti-CCP, elevated sedimentation rate, severe erosive disease.      Past Surgical History:  Procedure Laterality Date   BREAST SURGERY  10/13/1996   chest wall exploratory  07/01/2011   CHOLECYSTECTOMY     HIP ARTHROPLASTY     JOINT REPLACEMENT  04/04/2011   hip replacement    MASTECTOMY Right 2012   TUBAL LIGATION      Current Medications: Active Medications[1]   Allergies:   Contrast media [iodinated contrast media], Metrizamide, Other, Blue dye #1 (brilliant blue), and Dye fdc blue [brilliant blue fcf (fd&c blue #1)]   Social History   Socioeconomic History   Marital status: Married    Spouse name: Not on file   Number of children: Not on file    Years of education: Not on file   Highest education level: Not on file  Occupational History   Not on file  Tobacco Use   Smoking status: Never    Passive exposure: Past   Smokeless tobacco: Never   Tobacco comments:    never used tobacco  Vaping Use   Vaping status: Never Used  Substance and Sexual Activity   Alcohol  use: No    Alcohol /week: 0.0 standard drinks of alcohol    Drug use: No   Sexual activity: Not Currently  Other Topics Concern   Not on file  Social History Narrative   Not on file   Social Drivers of Health   Tobacco Use: Low Risk (10/25/2024)   Patient History    Smoking Tobacco Use: Never    Smokeless Tobacco Use: Never    Passive Exposure: Past  Financial Resource Strain: Not on file  Food Insecurity: Low Risk (05/05/2024)   Received from [facility]   Epic    Within the past 12 months, you worried that your food would run out before you got money to buy more: Never true    Within the past  12 months, the food you bought just didn't last and you didn't have money to get more. : Never true  Transportation Needs: No Transportation Needs (05/05/2024)   Received from [facility]   Transportation    In the past 12 months, has lack of reliable transportation kept you from medical appointments, meetings, work or from getting things needed for daily living? : No  Physical Activity: Not on file  Stress: Not on file  Social Connections: Not on file  Depression (PHQ2-9): Low Risk (09/05/2024)   Depression (PHQ2-9)    PHQ-2 Score: 0  Alcohol  Screen: Not on file  Housing: Low Risk (05/05/2024)   Received from [facility]   Epic    What is your living situation today?: I have a steady place to live    Think about the place you live. Do you have problems with any of the following? Choose all that apply:: None/None on this list  Utilities: Low Risk (05/05/2024)   Received from [facility]   Utilities    In the past 12 months has the electric, gas, oil, or  water company threatened to shut off services in your home? : No  Health Literacy: Not on file     Family History: The patient's family history includes Cancer (age of onset: 47) in her father; Cervical cancer in her mother; Diabetes in her brother, brother, brother, and mother; Heart attack in her father; Heart disease in her brother and brother; Hypertension in her brother, brother, brother, brother, father, and mother; Lung cancer in her father; Parkinson's disease in her brother and sister; Prostate cancer in her brother. There is no history of Breast cancer. ROS:   Please see the history of present illness.    All 14 point review of systems negative except as described per history of present illness.  EKGs/Labs/Other Studies Reviewed:    The following studies were reviewed today:   EKG:  EKG Interpretation Date/Time:  Tuesday October 25 2024 15:42:03 EST Ventricular Rate:  80 PR Interval:  200 QRS Duration:  100 QT Interval:  368 QTC Calculation: 424 R Axis:   81  Text Interpretation: Sinus rhythm with marked sinus arrhythmia Septal infarct , age undetermined No previous ECGs available Confirmed by Liborio Hai reddy [phone]) on 10/25/2024 4:03:40 PM    Recent Labs: 05/04/2024: TSH 2.840 09/05/2024: ALT 14; BUN 13; Creatinine 0.64; Hemoglobin 11.8; Platelet Count 206; Potassium 3.9; Sodium 144  Recent Lipid Panel No results found for: CHOL, TRIG, HDL, CHOLHDL, VLDL, LDLCALC, LDLDIRECT  Physical Exam:    VS:  BP (!) 132/56   Pulse 80   Ht 5' 3 (1.6 m)   Wt 179 lb 2 oz (81.3 kg)   SpO2 98%   BMI 31.73 kg/m     Wt Readings from Last 3 Encounters:  10/25/24 179 lb 2 oz (81.3 kg)  10/05/24 180 lb (81.6 kg)  09/05/24 185 lb (83.9 kg)     GENERAL:  Well nourished, well developed in no acute distress NECK: No JVD; No carotid bruits CARDIAC: RRR, S1 and S2 present, no murmurs, no rubs, no gallops CHEST:  Clear to auscultation without rales, wheezing  or rhonchi  Extremities: Bilateral compression socks on.  No significant pitting pedal edema. Pulses bilaterally symmetric with radial 2+ and dorsalis pedis 2+ NEUROLOGIC:  Alert and oriented x 3  Medication Adjustments/Labs and Tests Ordered: Current medicines are reviewed at length with the patient today.  Concerns regarding medicines are outlined above.  Orders Placed  This Encounter  Procedures   MYOCARDIAL PERFUSION IMAGING   EKG 12-Lead   ECHOCARDIOGRAM COMPLETE   No orders of the defined types were placed in this encounter.   Signed, Alean reddy Sammie Denner, MD, MPH, [facility]. 10/25/2024 4:28 PM    [facility]    [1]  Current Meds  Medication Sig   acetaminophen (TYLENOL) 500 MG tablet Take 500 mg by mouth every 6 (six) hours as needed.   acetaminophen (TYLENOL) 650 MG CR tablet Take 650 mg by mouth every 8 (eight) hours as needed for pain.   amLODipine (NORVASC) 10 MG tablet TAKE 1 TABLET BY MOUTH DAILY   amoxicillin (AMOXIL) 500 MG capsule Before dental procedures   aspirin EC 81 MG tablet Take 81 mg by mouth daily.   exemestane  (AROMASIN ) 25 MG tablet Take 1 tablet (25 mg total) by mouth daily.   hydrochlorothiazide 25 MG tablet Take 25 mg by mouth daily.    IBUPROFEN PO Take 400 mg by mouth 2 (two) times daily as needed.   metoprolol tartrate (LOPRESSOR) 100 MG tablet Take 100 mg by mouth daily.   sertraline (ZOLOFT) 25 MG tablet Take 25 mg by mouth daily.   simvastatin (ZOCOR) 20 MG tablet Take 20 mg by mouth daily.   valsartan (DIOVAN) 160 MG tablet Take 160 mg by mouth daily.   Vitamin D , Ergocalciferol , (DRISDOL ) 1.25 MG (50000 UNIT) CAPS capsule TAKE 1 CAPSULE BY MOUTH ONE TIME PER WEEK   "

## 2024-10-26 ENCOUNTER — Telehealth: Payer: Self-pay | Admitting: *Deleted

## 2024-10-26 NOTE — Telephone Encounter (Signed)
 Left a detailed message with instructions on voicemail as a reminder about a STRESS TEST on 11/01/24 at 8:15.

## 2024-11-01 ENCOUNTER — Ambulatory Visit: Payer: Self-pay

## 2024-11-01 ENCOUNTER — Ambulatory Visit

## 2024-11-01 DIAGNOSIS — Z01810 Encounter for preprocedural cardiovascular examination: Secondary | ICD-10-CM | POA: Diagnosis not present

## 2024-11-01 DIAGNOSIS — I10 Essential (primary) hypertension: Secondary | ICD-10-CM | POA: Diagnosis not present

## 2024-11-01 DIAGNOSIS — E785 Hyperlipidemia, unspecified: Secondary | ICD-10-CM | POA: Insufficient documentation

## 2024-11-01 LAB — MYOCARDIAL PERFUSION IMAGING
LV dias vol: 80 mL (ref 46–106)
LV sys vol: 18 mL
Nuc Stress EF: 78 %
Peak HR: 111 {beats}/min
Rest HR: 83 {beats}/min
Rest Nuclear Isotope Dose: 9.8 mCi
SDS: 1
SRS: 6
SSS: 7
Stress Nuclear Isotope Dose: 30.8 mCi
TID: 1.06

## 2024-11-01 MED ORDER — REGADENOSON 0.4 MG/5ML IV SOLN
0.4000 mg | Freq: Once | INTRAVENOUS | Status: AC
  Administered 2024-11-01: 0.4 mg via INTRAVENOUS

## 2024-11-01 MED ORDER — TECHNETIUM TC 99M TETROFOSMIN IV KIT
9.8000 | PACK | Freq: Once | INTRAVENOUS | Status: AC | PRN
  Administered 2024-11-01: 9.8 via INTRAVENOUS

## 2024-11-01 MED ORDER — TECHNETIUM TC 99M TETROFOSMIN IV KIT
30.8000 | PACK | Freq: Once | INTRAVENOUS | Status: AC | PRN
  Administered 2024-11-01: 30.8 via INTRAVENOUS

## 2024-11-09 ENCOUNTER — Inpatient Hospital Stay: Admitting: Medical Oncology

## 2024-11-09 ENCOUNTER — Inpatient Hospital Stay

## 2024-11-21 ENCOUNTER — Ambulatory Visit

## 2024-11-22 ENCOUNTER — Inpatient Hospital Stay

## 2024-11-22 ENCOUNTER — Inpatient Hospital Stay: Admitting: Medical Oncology

## 2024-12-23 ENCOUNTER — Ambulatory Visit
